# Patient Record
Sex: Female | Born: 1955 | Race: White | Hispanic: No | Marital: Married | Smoking: Never smoker
Health system: Southern US, Community
[De-identification: ages and names within clinical notes are randomized; demographics above are authoritative.]

## PROBLEM LIST (undated history)

## (undated) DIAGNOSIS — R112 Nausea with vomiting, unspecified: Secondary | ICD-10-CM

## (undated) DIAGNOSIS — G250 Essential tremor: Secondary | ICD-10-CM

## (undated) DIAGNOSIS — E0590 Thyrotoxicosis, unspecified without thyrotoxic crisis or storm: Secondary | ICD-10-CM

## (undated) DIAGNOSIS — T4145XA Adverse effect of unspecified anesthetic, initial encounter: Secondary | ICD-10-CM

## (undated) DIAGNOSIS — E039 Hypothyroidism, unspecified: Secondary | ICD-10-CM

## (undated) DIAGNOSIS — M1990 Unspecified osteoarthritis, unspecified site: Secondary | ICD-10-CM

## (undated) DIAGNOSIS — T7840XA Allergy, unspecified, initial encounter: Secondary | ICD-10-CM

## (undated) DIAGNOSIS — J90 Pleural effusion, not elsewhere classified: Secondary | ICD-10-CM

## (undated) DIAGNOSIS — G709 Myoneural disorder, unspecified: Secondary | ICD-10-CM

## (undated) DIAGNOSIS — H269 Unspecified cataract: Secondary | ICD-10-CM

## (undated) DIAGNOSIS — C4490 Unspecified malignant neoplasm of skin, unspecified: Secondary | ICD-10-CM

## (undated) DIAGNOSIS — I5022 Chronic systolic (congestive) heart failure: Secondary | ICD-10-CM

## (undated) DIAGNOSIS — K219 Gastro-esophageal reflux disease without esophagitis: Secondary | ICD-10-CM

## (undated) DIAGNOSIS — I428 Other cardiomyopathies: Secondary | ICD-10-CM

## (undated) DIAGNOSIS — I480 Paroxysmal atrial fibrillation: Secondary | ICD-10-CM

## (undated) DIAGNOSIS — Z9889 Other specified postprocedural states: Secondary | ICD-10-CM

## (undated) DIAGNOSIS — I10 Essential (primary) hypertension: Secondary | ICD-10-CM

## (undated) DIAGNOSIS — Z950 Presence of cardiac pacemaker: Secondary | ICD-10-CM

## (undated) DIAGNOSIS — M4120 Other idiopathic scoliosis, site unspecified: Secondary | ICD-10-CM

## (undated) DIAGNOSIS — T8859XA Other complications of anesthesia, initial encounter: Secondary | ICD-10-CM

## (undated) DIAGNOSIS — F32A Depression, unspecified: Secondary | ICD-10-CM

## (undated) DIAGNOSIS — M419 Scoliosis, unspecified: Secondary | ICD-10-CM

## (undated) DIAGNOSIS — I7300 Raynaud's syndrome without gangrene: Secondary | ICD-10-CM

## (undated) DIAGNOSIS — I351 Nonrheumatic aortic (valve) insufficiency: Secondary | ICD-10-CM

## (undated) DIAGNOSIS — G43909 Migraine, unspecified, not intractable, without status migrainosus: Secondary | ICD-10-CM

## (undated) DIAGNOSIS — I447 Left bundle-branch block, unspecified: Secondary | ICD-10-CM

## (undated) DIAGNOSIS — D219 Benign neoplasm of connective and other soft tissue, unspecified: Secondary | ICD-10-CM

## (undated) HISTORY — DX: Scoliosis, unspecified: M41.9

## (undated) HISTORY — DX: Migraine, unspecified, not intractable, without status migrainosus: G43.909

## (undated) HISTORY — PX: VESICOVAGINAL FISTULA CLOSURE W/ TAH: SUR271

## (undated) HISTORY — PX: CHOLECYSTECTOMY: SHX55

## (undated) HISTORY — DX: Allergy, unspecified, initial encounter: T78.40XA

## (undated) HISTORY — DX: Myoneural disorder, unspecified: G70.9

## (undated) HISTORY — PX: ROTATOR CUFF REPAIR: SHX139

## (undated) HISTORY — DX: Essential (primary) hypertension: I10

## (undated) HISTORY — PX: VAGINAL HYSTERECTOMY: SUR661

## (undated) HISTORY — PX: SKIN CANCER EXCISION: SHX779

## (undated) HISTORY — DX: Benign neoplasm of connective and other soft tissue, unspecified: D21.9

## (undated) HISTORY — DX: Paroxysmal atrial fibrillation: I48.0

## (undated) HISTORY — DX: Left bundle-branch block, unspecified: I44.7

## (undated) HISTORY — DX: Other idiopathic scoliosis, site unspecified: M41.20

## (undated) HISTORY — DX: Unspecified cataract: H26.9

## (undated) HISTORY — PX: SPINE SURGERY: SHX786

## (undated) HISTORY — PX: FOOT NEUROMA SURGERY: SHX646

## (undated) HISTORY — PX: TUBAL LIGATION: SHX77

## (undated) HISTORY — DX: Pleural effusion, not elsewhere classified: J90

## (undated) HISTORY — PX: BREAST SURGERY: SHX581

## (undated) HISTORY — DX: Depression, unspecified: F32.A

## (undated) HISTORY — PX: THYROID LOBECTOMY: SHX420

---

## 1998-04-29 ENCOUNTER — Other Ambulatory Visit: Admission: RE | Admit: 1998-04-29 | Discharge: 1998-04-29 | Payer: Self-pay | Admitting: *Deleted

## 1998-11-11 DIAGNOSIS — C4491 Basal cell carcinoma of skin, unspecified: Secondary | ICD-10-CM

## 1998-11-11 HISTORY — DX: Basal cell carcinoma of skin, unspecified: C44.91

## 1999-05-11 ENCOUNTER — Other Ambulatory Visit: Admission: RE | Admit: 1999-05-11 | Discharge: 1999-05-11 | Payer: Self-pay | Admitting: Obstetrics and Gynecology

## 1999-09-27 ENCOUNTER — Encounter: Payer: Self-pay | Admitting: Family Medicine

## 1999-09-27 ENCOUNTER — Encounter: Admission: RE | Admit: 1999-09-27 | Discharge: 1999-09-27 | Payer: Self-pay | Admitting: Family Medicine

## 1999-10-06 ENCOUNTER — Encounter: Admission: RE | Admit: 1999-10-06 | Discharge: 1999-10-06 | Payer: Self-pay | Admitting: Family Medicine

## 1999-10-06 ENCOUNTER — Encounter: Payer: Self-pay | Admitting: Family Medicine

## 2000-05-26 ENCOUNTER — Other Ambulatory Visit: Admission: RE | Admit: 2000-05-26 | Discharge: 2000-05-26 | Payer: Self-pay | Admitting: Obstetrics and Gynecology

## 2001-07-17 ENCOUNTER — Encounter: Admission: RE | Admit: 2001-07-17 | Discharge: 2001-07-17 | Payer: Self-pay | Admitting: Family Medicine

## 2001-07-17 ENCOUNTER — Encounter: Payer: Self-pay | Admitting: Family Medicine

## 2001-08-03 ENCOUNTER — Other Ambulatory Visit: Admission: RE | Admit: 2001-08-03 | Discharge: 2001-08-03 | Payer: Self-pay | Admitting: Obstetrics and Gynecology

## 2001-10-24 ENCOUNTER — Encounter: Admission: RE | Admit: 2001-10-24 | Discharge: 2001-10-24 | Payer: Self-pay

## 2001-11-13 ENCOUNTER — Encounter: Admission: RE | Admit: 2001-11-13 | Discharge: 2001-11-13 | Payer: Self-pay

## 2002-07-23 ENCOUNTER — Encounter: Payer: Self-pay | Admitting: Family Medicine

## 2002-07-23 ENCOUNTER — Encounter: Admission: RE | Admit: 2002-07-23 | Discharge: 2002-07-23 | Payer: Self-pay | Admitting: Family Medicine

## 2002-09-24 ENCOUNTER — Other Ambulatory Visit: Admission: RE | Admit: 2002-09-24 | Discharge: 2002-09-24 | Payer: Self-pay | Admitting: Family Medicine

## 2003-03-11 DIAGNOSIS — C4491 Basal cell carcinoma of skin, unspecified: Secondary | ICD-10-CM

## 2003-03-11 HISTORY — DX: Basal cell carcinoma of skin, unspecified: C44.91

## 2003-08-22 ENCOUNTER — Encounter: Admission: RE | Admit: 2003-08-22 | Discharge: 2003-08-22 | Payer: Self-pay | Admitting: Family Medicine

## 2003-09-26 ENCOUNTER — Other Ambulatory Visit: Admission: RE | Admit: 2003-09-26 | Discharge: 2003-09-26 | Payer: Self-pay | Admitting: Family Medicine

## 2004-02-29 ENCOUNTER — Emergency Department (HOSPITAL_COMMUNITY): Admission: EM | Admit: 2004-02-29 | Discharge: 2004-03-01 | Payer: Self-pay | Admitting: Emergency Medicine

## 2004-04-08 ENCOUNTER — Ambulatory Visit (HOSPITAL_COMMUNITY): Admission: RE | Admit: 2004-04-08 | Discharge: 2004-04-08 | Payer: Self-pay | Admitting: Family Medicine

## 2004-04-28 ENCOUNTER — Other Ambulatory Visit: Admission: RE | Admit: 2004-04-28 | Discharge: 2004-04-28 | Payer: Self-pay | Admitting: Interventional Radiology

## 2004-04-28 ENCOUNTER — Encounter (INDEPENDENT_AMBULATORY_CARE_PROVIDER_SITE_OTHER): Payer: Self-pay | Admitting: *Deleted

## 2004-04-28 ENCOUNTER — Encounter: Admission: RE | Admit: 2004-04-28 | Discharge: 2004-04-28 | Payer: Self-pay | Admitting: Family Medicine

## 2004-07-01 ENCOUNTER — Encounter (INDEPENDENT_AMBULATORY_CARE_PROVIDER_SITE_OTHER): Payer: Self-pay | Admitting: Specialist

## 2004-07-01 ENCOUNTER — Observation Stay (HOSPITAL_COMMUNITY): Admission: RE | Admit: 2004-07-01 | Discharge: 2004-07-02 | Payer: Self-pay | Admitting: Surgery

## 2004-07-14 DIAGNOSIS — C4492 Squamous cell carcinoma of skin, unspecified: Secondary | ICD-10-CM

## 2004-07-14 HISTORY — DX: Squamous cell carcinoma of skin, unspecified: C44.92

## 2004-08-23 ENCOUNTER — Encounter: Admission: RE | Admit: 2004-08-23 | Discharge: 2004-08-23 | Payer: Self-pay | Admitting: Family Medicine

## 2004-09-28 ENCOUNTER — Other Ambulatory Visit: Admission: RE | Admit: 2004-09-28 | Discharge: 2004-09-28 | Payer: Self-pay | Admitting: Family Medicine

## 2004-09-29 ENCOUNTER — Encounter: Admission: RE | Admit: 2004-09-29 | Discharge: 2004-09-29 | Payer: Self-pay | Admitting: Family Medicine

## 2004-10-15 ENCOUNTER — Encounter: Admission: RE | Admit: 2004-10-15 | Discharge: 2004-10-15 | Payer: Self-pay | Admitting: Family Medicine

## 2005-08-11 ENCOUNTER — Encounter (INDEPENDENT_AMBULATORY_CARE_PROVIDER_SITE_OTHER): Payer: Self-pay | Admitting: Specialist

## 2005-08-11 ENCOUNTER — Ambulatory Visit (HOSPITAL_BASED_OUTPATIENT_CLINIC_OR_DEPARTMENT_OTHER): Admission: RE | Admit: 2005-08-11 | Discharge: 2005-08-11 | Payer: Self-pay | Admitting: Plastic Surgery

## 2005-10-20 ENCOUNTER — Encounter: Admission: RE | Admit: 2005-10-20 | Discharge: 2005-10-20 | Payer: Self-pay | Admitting: Family Medicine

## 2007-02-15 DIAGNOSIS — I7300 Raynaud's syndrome without gangrene: Secondary | ICD-10-CM | POA: Insufficient documentation

## 2007-12-10 ENCOUNTER — Ambulatory Visit (HOSPITAL_COMMUNITY): Admission: RE | Admit: 2007-12-10 | Discharge: 2007-12-11 | Payer: Self-pay | Admitting: Obstetrics & Gynecology

## 2007-12-10 ENCOUNTER — Encounter: Payer: Self-pay | Admitting: Obstetrics & Gynecology

## 2008-01-30 DIAGNOSIS — G43709 Chronic migraine without aura, not intractable, without status migrainosus: Secondary | ICD-10-CM | POA: Insufficient documentation

## 2008-01-30 DIAGNOSIS — G43009 Migraine without aura, not intractable, without status migrainosus: Secondary | ICD-10-CM | POA: Insufficient documentation

## 2008-01-30 DIAGNOSIS — I10 Essential (primary) hypertension: Secondary | ICD-10-CM | POA: Insufficient documentation

## 2008-03-21 DIAGNOSIS — E559 Vitamin D deficiency, unspecified: Secondary | ICD-10-CM | POA: Insufficient documentation

## 2008-05-28 ENCOUNTER — Ambulatory Visit: Payer: Self-pay | Admitting: Oncology

## 2008-06-11 LAB — CBC WITH DIFFERENTIAL/PLATELET
BASO%: 0.4 % (ref 0.0–2.0)
Basophils Absolute: 0 10*3/uL (ref 0.0–0.1)
EOS%: 0.9 % (ref 0.0–7.0)
Eosinophils Absolute: 0.1 10*3/uL (ref 0.0–0.5)
HCT: 39.4 % (ref 34.8–46.6)
HGB: 13.7 g/dL (ref 11.6–15.9)
LYMPH%: 21.5 % (ref 14.0–49.7)
MCH: 31 pg (ref 25.1–34.0)
MCHC: 34.8 g/dL (ref 31.5–36.0)
MCV: 89 fL (ref 79.5–101.0)
MONO#: 1.1 10*3/uL — ABNORMAL HIGH (ref 0.1–0.9)
MONO%: 9 % (ref 0.0–14.0)
NEUT#: 8 10*3/uL — ABNORMAL HIGH (ref 1.5–6.5)
NEUT%: 68.2 % (ref 38.4–76.8)
Platelets: 404 10*3/uL — ABNORMAL HIGH (ref 145–400)
RBC: 4.43 10*6/uL (ref 3.70–5.45)
RDW: 12.2 % (ref 11.2–14.5)
WBC: 11.8 10*3/uL — ABNORMAL HIGH (ref 3.9–10.3)
lymph#: 2.5 10*3/uL (ref 0.9–3.3)

## 2008-06-11 LAB — COMPREHENSIVE METABOLIC PANEL
ALT: 29 U/L (ref 0–35)
AST: 29 U/L (ref 0–37)
Albumin: 3.3 g/dL — ABNORMAL LOW (ref 3.5–5.2)
Alkaline Phosphatase: 67 U/L (ref 39–117)
BUN: 13 mg/dL (ref 6–23)
CO2: 32 mEq/L (ref 19–32)
Calcium: 8.7 mg/dL (ref 8.4–10.5)
Chloride: 101 mEq/L (ref 96–112)
Creatinine, Ser: 0.83 mg/dL (ref 0.40–1.20)
Glucose, Bld: 88 mg/dL (ref 70–99)
Potassium: 3.8 mEq/L (ref 3.5–5.3)
Sodium: 137 mEq/L (ref 135–145)
Total Bilirubin: 0.8 mg/dL (ref 0.3–1.2)
Total Protein: 6.4 g/dL (ref 6.0–8.3)

## 2008-06-11 LAB — ERYTHROCYTE SEDIMENTATION RATE: Sed Rate: 36 mm/hr — ABNORMAL HIGH (ref 0–30)

## 2008-06-11 LAB — CHCC SMEAR

## 2008-06-17 LAB — PTT FACTOR INHIBITOR (MIXING STUDY): PTT: 41 seconds — ABNORMAL HIGH (ref 24–37)

## 2008-06-17 LAB — VON WILLEBRAND FACTOR MULTIMER
Factor-VIII Activity: 100 % (ref 50–180)
Ristocetin Co-Factor: 106 % (ref 42–200)
Von Willebrand Factor Ag: 116 % (ref >49)

## 2008-06-17 LAB — PROTHROMBIN TIME
INR: 1 (ref 0.0–1.5)
Prothrombin Time: 13.1 seconds (ref 11.6–15.2)

## 2008-06-17 LAB — MIXING STUDY DILUTIONS, PTT
Patient 1/1 Immediate Mix: 32 seconds
Patient 4/1 Immediate Mix: 35 seconds

## 2008-06-30 LAB — LUPUS ANTICOAGULANT PANEL
DRVVT: 34.2 secs — ABNORMAL LOW (ref 36.1–47.0)
PTT Lupus Anticoagulant: 49.3 secs — ABNORMAL HIGH (ref 36.3–48.8)
PTTLA 4:1 Mix: 39.1 secs (ref 36.3–48.8)

## 2008-06-30 LAB — FACTOR 12 ASSAY: Factor XII Activity: 192 % — ABNORMAL HIGH (ref 58–166)

## 2008-06-30 LAB — FACTOR 8 ASSAY: Coagulation Factor VIII: 120 % (ref 73–140)

## 2008-06-30 LAB — FACTOR 11: Factor XI Activity: 94 % (ref 56–153)

## 2008-06-30 LAB — FACTOR 13, QUAL

## 2008-06-30 LAB — FACTOR 8 INHIBITOR: Factor VIII: C, Activity: 118 % (ref 56–191)

## 2008-06-30 LAB — FACTOR 9 ASSAY: Coagulation Factor IX: 126 % (ref 75–134)

## 2008-06-30 LAB — FACTOR 10 ASSAY: Factor X Activity: 126 % (ref 72–134)

## 2008-07-24 DIAGNOSIS — F411 Generalized anxiety disorder: Secondary | ICD-10-CM | POA: Insufficient documentation

## 2009-02-18 ENCOUNTER — Encounter: Admission: RE | Admit: 2009-02-18 | Discharge: 2009-02-18 | Payer: Self-pay | Admitting: Rheumatology

## 2009-04-01 ENCOUNTER — Ambulatory Visit (HOSPITAL_COMMUNITY): Admission: RE | Admit: 2009-04-01 | Discharge: 2009-04-02 | Payer: Self-pay | Admitting: Orthopedic Surgery

## 2009-04-15 DIAGNOSIS — Z780 Asymptomatic menopausal state: Secondary | ICD-10-CM | POA: Insufficient documentation

## 2009-06-11 ENCOUNTER — Encounter: Admission: RE | Admit: 2009-06-11 | Discharge: 2009-06-11 | Payer: Self-pay | Admitting: Orthopedic Surgery

## 2009-07-27 DIAGNOSIS — C4492 Squamous cell carcinoma of skin, unspecified: Secondary | ICD-10-CM

## 2009-07-27 HISTORY — DX: Squamous cell carcinoma of skin, unspecified: C44.92

## 2009-11-10 ENCOUNTER — Encounter: Admission: RE | Admit: 2009-11-10 | Discharge: 2009-11-10 | Payer: Self-pay | Admitting: Orthopedic Surgery

## 2009-12-25 DIAGNOSIS — M48061 Spinal stenosis, lumbar region without neurogenic claudication: Secondary | ICD-10-CM | POA: Insufficient documentation

## 2009-12-25 HISTORY — DX: Spinal stenosis, lumbar region without neurogenic claudication: M48.061

## 2009-12-31 HISTORY — PX: BACK SURGERY: SHX140

## 2010-02-08 ENCOUNTER — Inpatient Hospital Stay (HOSPITAL_COMMUNITY)
Admission: EM | Admit: 2010-02-08 | Discharge: 2010-02-15 | DRG: 544 | Disposition: A | Discharge status: Home / Self Care | Payer: BC Managed Care – PPO | Attending: Internal Medicine | Admitting: Internal Medicine

## 2010-02-08 DIAGNOSIS — I5023 Acute on chronic systolic (congestive) heart failure: Principal | ICD-10-CM | POA: Diagnosis present

## 2010-02-08 DIAGNOSIS — I4891 Unspecified atrial fibrillation: Secondary | ICD-10-CM | POA: Diagnosis present

## 2010-02-08 DIAGNOSIS — F411 Generalized anxiety disorder: Secondary | ICD-10-CM | POA: Diagnosis present

## 2010-02-08 DIAGNOSIS — E876 Hypokalemia: Secondary | ICD-10-CM | POA: Diagnosis not present

## 2010-02-08 DIAGNOSIS — R5381 Other malaise: Secondary | ICD-10-CM | POA: Diagnosis present

## 2010-02-08 DIAGNOSIS — J690 Pneumonitis due to inhalation of food and vomit: Secondary | ICD-10-CM | POA: Diagnosis present

## 2010-02-08 DIAGNOSIS — J90 Pleural effusion, not elsewhere classified: Secondary | ICD-10-CM | POA: Diagnosis present

## 2010-02-08 DIAGNOSIS — G4700 Insomnia, unspecified: Secondary | ICD-10-CM | POA: Diagnosis present

## 2010-02-08 DIAGNOSIS — I447 Left bundle-branch block, unspecified: Secondary | ICD-10-CM | POA: Diagnosis present

## 2010-02-08 DIAGNOSIS — I7300 Raynaud's syndrome without gangrene: Secondary | ICD-10-CM | POA: Diagnosis present

## 2010-02-08 DIAGNOSIS — M810 Age-related osteoporosis without current pathological fracture: Secondary | ICD-10-CM | POA: Diagnosis present

## 2010-02-08 DIAGNOSIS — G43909 Migraine, unspecified, not intractable, without status migrainosus: Secondary | ICD-10-CM | POA: Diagnosis present

## 2010-02-08 DIAGNOSIS — J189 Pneumonia, unspecified organism: Secondary | ICD-10-CM | POA: Diagnosis present

## 2010-02-08 DIAGNOSIS — E039 Hypothyroidism, unspecified: Secondary | ICD-10-CM | POA: Diagnosis present

## 2010-02-08 DIAGNOSIS — Z7982 Long term (current) use of aspirin: Secondary | ICD-10-CM

## 2010-02-08 DIAGNOSIS — D638 Anemia in other chronic diseases classified elsewhere: Secondary | ICD-10-CM | POA: Diagnosis present

## 2010-02-08 DIAGNOSIS — I428 Other cardiomyopathies: Secondary | ICD-10-CM | POA: Diagnosis present

## 2010-02-08 DIAGNOSIS — I509 Heart failure, unspecified: Secondary | ICD-10-CM | POA: Diagnosis present

## 2010-02-08 LAB — DIFFERENTIAL
Basophils Absolute: 0 10*3/uL (ref 0.0–0.1)
Basophils Relative: 0 % (ref 0–1)
Eosinophils Absolute: 0.3 10*3/uL (ref 0.0–0.7)
Eosinophils Relative: 3 % (ref 0–5)
Lymphocytes Relative: 17 % (ref 12–46)
Lymphs Abs: 1.9 10*3/uL (ref 0.7–4.0)
Monocytes Absolute: 0.9 10*3/uL (ref 0.1–1.0)
Monocytes Relative: 8 % (ref 3–12)
Neutro Abs: 8.1 10*3/uL — ABNORMAL HIGH (ref 1.7–7.7)
Neutrophils Relative %: 72 % (ref 43–77)

## 2010-02-08 LAB — BASIC METABOLIC PANEL
BUN: 11 mg/dL (ref 6–23)
CO2: 28 mEq/L (ref 19–32)
Calcium: 8.9 mg/dL (ref 8.4–10.5)
Chloride: 99 mEq/L (ref 96–112)
Creatinine, Ser: 1.11 mg/dL (ref 0.4–1.2)
GFR calc Af Amer: >60 mL/min (ref >60)
GFR calc non Af Amer: 51 mL/min — ABNORMAL LOW (ref >60)
Glucose, Bld: 93 mg/dL (ref 70–99)
Potassium: 3.2 mEq/L — ABNORMAL LOW (ref 3.5–5.1)
Sodium: 139 mEq/L (ref 135–145)

## 2010-02-08 LAB — CBC
HCT: 35.4 % — ABNORMAL LOW (ref 36.0–46.0)
Hemoglobin: 11.2 g/dL — ABNORMAL LOW (ref 12.0–15.0)
MCH: 28.4 pg (ref 26.0–34.0)
MCHC: 31.6 g/dL (ref 30.0–36.0)
MCV: 89.8 fL (ref 78.0–100.0)
Platelets: 483 10*3/uL — ABNORMAL HIGH (ref 150–400)
RBC: 3.94 MIL/uL (ref 3.87–5.11)
RDW: 15.4 % (ref 11.5–15.5)
WBC: 11.1 10*3/uL — ABNORMAL HIGH (ref 4.0–10.5)

## 2010-02-08 LAB — POCT CARDIAC MARKERS
CKMB, poc: 4.8 ng/mL (ref 1.0–8.0)
CKMB, poc: 2.3 ng/mL (ref 1.0–8.0)
Myoglobin, poc: 116 ng/mL (ref 12–200)
Myoglobin, poc: 75.5 ng/mL (ref 12–200)
Troponin i, poc: 0.11 ng/mL — ABNORMAL HIGH (ref 0.00–0.09)
Troponin i, poc: 0.16 ng/mL — ABNORMAL HIGH (ref 0.00–0.09)

## 2010-02-08 LAB — CK TOTAL AND CKMB (NOT AT ARMC)
CK, MB: 3.6 ng/mL (ref 0.3–4.0)
Relative Index: INVALID (ref 0.0–2.5)
Total CK: 56 U/L (ref 7–177)

## 2010-02-08 LAB — BRAIN NATRIURETIC PEPTIDE: Pro B Natriuretic peptide (BNP): 598 pg/mL — ABNORMAL HIGH (ref 0.0–100.0)

## 2010-02-08 LAB — TSH: TSH: 2.531 u[IU]/mL (ref 0.350–4.500)

## 2010-02-08 LAB — TROPONIN I: Troponin I: 0.06 ng/mL (ref 0.00–0.06)

## 2010-02-09 ENCOUNTER — Other Ambulatory Visit: Payer: Self-pay | Admitting: Interventional Radiology

## 2010-02-09 LAB — BODY FLUID CELL COUNT WITH DIFFERENTIAL
Eos, Fluid: 0 %
Lymphs, Fluid: 15 %
Monocyte-Macrophage-Serous Fluid: 29 % — ABNORMAL LOW (ref 50–90)
Neutrophil Count, Fluid: 56 % — ABNORMAL HIGH (ref 0–25)
Other Cells, Fluid: 0 %
Total Nucleated Cell Count, Fluid: 1475 cu mm — ABNORMAL HIGH (ref 0–1000)

## 2010-02-09 LAB — CBC
HCT: 32.5 % — ABNORMAL LOW (ref 36.0–46.0)
Hemoglobin: 10.3 g/dL — ABNORMAL LOW (ref 12.0–15.0)
MCH: 28.8 pg (ref 26.0–34.0)
MCHC: 31.7 g/dL (ref 30.0–36.0)
MCV: 90.8 fL (ref 78.0–100.0)
Platelets: 431 10*3/uL — ABNORMAL HIGH (ref 150–400)
RBC: 3.58 MIL/uL — ABNORMAL LOW (ref 3.87–5.11)
RDW: 15.7 % — ABNORMAL HIGH (ref 11.5–15.5)
WBC: 10.5 10*3/uL (ref 4.0–10.5)

## 2010-02-09 LAB — CARDIAC PANEL(CRET KIN+CKTOT+MB+TROPI)
CK, MB: 3.1 ng/mL (ref 0.3–4.0)
CK, MB: 3.7 ng/mL (ref 0.3–4.0)
Relative Index: INVALID (ref 0.0–2.5)
Relative Index: INVALID (ref 0.0–2.5)
Total CK: 52 U/L (ref 7–177)
Total CK: 56 U/L (ref 7–177)
Troponin I: 0.05 ng/mL (ref 0.00–0.06)
Troponin I: 0.05 ng/mL (ref 0.00–0.06)

## 2010-02-09 LAB — BASIC METABOLIC PANEL
BUN: 7 mg/dL (ref 6–23)
CO2: 30 mEq/L (ref 19–32)
Calcium: 8.5 mg/dL (ref 8.4–10.5)
Chloride: 103 mEq/L (ref 96–112)
Creatinine, Ser: 1.05 mg/dL (ref 0.4–1.2)
GFR calc Af Amer: >60 mL/min (ref >60)
GFR calc non Af Amer: 54 mL/min — ABNORMAL LOW (ref >60)
Glucose, Bld: 97 mg/dL (ref 70–99)
Potassium: 3.2 mEq/L — ABNORMAL LOW (ref 3.5–5.1)
Sodium: 143 mEq/L (ref 135–145)

## 2010-02-09 LAB — LACTATE DEHYDROGENASE, PLEURAL OR PERITONEAL FLUID: LD, Fluid: 134 U/L — ABNORMAL HIGH (ref 3–23)

## 2010-02-09 LAB — PROTEIN, BODY FLUID: Total protein, fluid: 3.9 g/dL

## 2010-02-09 LAB — MAGNESIUM: Magnesium: 2 mg/dL (ref 1.5–2.5)

## 2010-02-09 LAB — GLUCOSE, SEROUS FLUID: Glucose, Fluid: 119 mg/dL

## 2010-02-10 ENCOUNTER — Encounter (HOSPITAL_COMMUNITY): Payer: Self-pay

## 2010-02-10 ENCOUNTER — Inpatient Hospital Stay (HOSPITAL_COMMUNITY): Payer: BC Managed Care – PPO

## 2010-02-10 DIAGNOSIS — I059 Rheumatic mitral valve disease, unspecified: Secondary | ICD-10-CM

## 2010-02-10 LAB — CBC
HCT: 30.8 % — ABNORMAL LOW (ref 36.0–46.0)
Hemoglobin: 9.8 g/dL — ABNORMAL LOW (ref 12.0–15.0)
MCH: 28.9 pg (ref 26.0–34.0)
MCHC: 31.8 g/dL (ref 30.0–36.0)
MCV: 90.9 fL (ref 78.0–100.0)
Platelets: 420 10*3/uL — ABNORMAL HIGH (ref 150–400)
RBC: 3.39 MIL/uL — ABNORMAL LOW (ref 3.87–5.11)
RDW: 15.5 % (ref 11.5–15.5)
WBC: 10.9 10*3/uL — ABNORMAL HIGH (ref 4.0–10.5)

## 2010-02-10 LAB — PATHOLOGIST SMEAR REVIEW

## 2010-02-10 LAB — BASIC METABOLIC PANEL
BUN: 7 mg/dL (ref 6–23)
CO2: 30 mEq/L (ref 19–32)
Calcium: 8.4 mg/dL (ref 8.4–10.5)
Chloride: 101 mEq/L (ref 96–112)
Creatinine, Ser: 1.17 mg/dL (ref 0.4–1.2)
GFR calc Af Amer: 58 mL/min — ABNORMAL LOW (ref >60)
GFR calc non Af Amer: 48 mL/min — ABNORMAL LOW (ref >60)
Glucose, Bld: 94 mg/dL (ref 70–99)
Potassium: 3.4 mEq/L — ABNORMAL LOW (ref 3.5–5.1)
Sodium: 140 mEq/L (ref 135–145)

## 2010-02-10 LAB — BRAIN NATRIURETIC PEPTIDE: Pro B Natriuretic peptide (BNP): 488 pg/mL — ABNORMAL HIGH (ref 0.0–100.0)

## 2010-02-10 NOTE — H&P (Signed)
[name], [name]              ACCOUNT NO.:  [id number]  MEDICAL RECORD NO.:  [id number]          PATIENT TYPE:  INP  LOCATION:  3739                         FACILITY:  [facility]  PHYSICIAN:  Hillery Aldo, M.D.   DATE OF BIRTH:  1955-06-07  DATE OF ADMISSION:  02/08/2010 DATE OF DISCHARGE:                             HISTORY & PHYSICAL   PRIMARY CARE PHYSICIAN:  Tammy R. Collins Scotland, MD at the [facility] in [place], [place].  CHIEF COMPLAINT:  Dyspnea, nonproductive cough.  HISTORY OF PRESENT ILLNESS:  The patient is a 55 year old female with a past medical history of surgery for scoliosis.  She was at [facility] for this surgery, which took place on December 31, 2009.  Her hospital stay was complicated by the development of aspiration pneumonia and ventilatory-dependent respiratory failure.  She was apparently in the hospital for 3 weeks and then subsequently discharged to rehab facility and ultimately returned home on January 30, 2010.  The patient states that since she has been home, she has had progressive dyspnea to the point where she is unable to lie flat in bed.  She has had a nonproductive cough, but no frank fever or chills.  She has been weak and has been having to ambulate with a walker.  The patient saw her primary care physician today and had a chest radiograph done, which showed a very large effusion and infiltrate.  She subsequently was referred to the emergency department, evaluated by the emergency department physicians, then referred to the hospitalist service for inpatient treatment.  PAST MEDICAL HISTORY: 1. Osteoporosis. 2. Scoliosis. 3. History of pneumonia x2. 4. Hypothyroidism. 5. Fibroids. 6. Migraine headaches. 7. Left bundle-branch block. 8. Insomnia. 9. Anxiety. 10.Paroxysmal atrial fibrillation. 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  PAST SURGICAL HISTORY: 1. Cholecystectomy. 2. Hysterectomy secondary to uterine prolapse. 3. Right thyroid  lobectomy for treatment of a right thyroid nodule     with pathology showing atypical follicular cells. 4. Left shoulder repair for a torn rotator cuff. 5. Scoliosis surgery.  FAMILY HISTORY:  The patient's father is alive at age 61 and suffers with macular degeneration.  He also has heart disease.  The patient's mother died at 71 from congestive heart failure.  She also had a prior stroke and was hypertensive.  The patient has 4 sisters.  One of them, has atrial fibrillation and heart disease, but the others 3 are healthy. She has 1 healthy son.  SOCIAL HISTORY:  The patient is married and lives with her [place] in [place], [place].  His name is Jenissa Tyrell and serves as a Aeronautical engineer.  He can be reached at area code, [phone] on his cell phone.  The patient is a lifelong nonsmoker, does not consume alcohol or do drugs.  She works as a Runner, broadcasting/film/video.  ALLERGIES:  CODEINE, PREDNISONE, ROBAXIN, and FENTANYL PATCHES  CURRENT MEDICATIONS: 1. Acetaminophen 1500 mg p.o. t.i.d. 2. Calcium plus vitamin D 500 mg p.o. b.i.d. 3. Diltiazem 60 mg ER 1 p.o. b.i.d. 4. Fleet bisacodyl 10 mg rectal suppository daily p.r.n. 5. Flonase 2 sprays in each nostril daily. 6. Gabapentin 300 mg p.o. b.i.d. 7. Imipramine  100 mg p.o. b.i.d. 8. Incentive spirometer q.1 hour while awake. 9. Lunesta 3 mg p.o. nightly. 10.Magic mouth wash swish and swallow 1 teaspoon q.i.d. p.r.n. 11.Mobic 7.5 mg p.o. b.i.d., discontinued after her stay at [facility]. 12.Oxygen at 2 L p.r.n. 13.Omeprazole 10 mg p.o. nightly. 14.Relpax 40 mg p.o. at onset of headache, repeat in 2 hours p.r.n.     with a maximum daily dose of 2 doses per day. 15.Senna 8.6 p.r.n. constipation. 16.Stool softener daily p.r.n. constipation. 17.Synthroid 125 mcg p.o. daily. 18.Tramadol 25 mg daily p.r.n. pain. 19.Voltaren gel p.r.n.  REVIEW OF SYSTEMS:  Comprehensive 14-point review of systems is as noted in the elements of the HPI  above.  Pertinent negatives include normal appetite, no complaints of chest pain, no nausea, vomiting, diarrhea, melena, hematochezia or dysuria.  Pertinent positives are occasional dysphagia for solids and liquids with pain upon swallowing.  PHYSICAL EXAMINATION:  VITAL SIGNS:  Temperature 97.4, pulse 88, respirations 17, blood pressure 134/65, O2 saturation 96% on room air. GENERAL:  Well-developed, well-nourished female in no acute distress. HEENT:  Normocephalic, atraumatic.  PERRL.  EOMI.  Sclerae nonicteric. Oropharynx reveals dry mucous membranes, but is otherwise clear. NECK:  Supple, no thyromegaly, no lymphadenopathy, no jugular venous distention. CHEST:  Markedly diminished breath sounds on the right.  Crackles at the left lung base.  No wheeze. HEART:  Regular rate, rhythm.  No murmurs, rubs, or gallops. ABDOMEN:  Soft, nontender, nondistended with normoactive bowel sounds. EXTREMITIES:  A 2+ edema bilaterally. SKIN:  Warm and dry.  No rashes. NEUROLOGIC:  The patient is alert and oriented x3.  Cranial nerves II through XII grossly intact.  Nonfocal.  DATA REVIEW: 1. Chest x-ray on February 08, 2010, showed moderate enlargement of the     cardiac silhouette with mild vascular congestion.  Minimal right     basilar atelectasis associated with elevated right hemidiaphragm     and small right pleural effusion. 2. Right decubitus film showed a layering of small, mobile right     pleural effusion. 3. CT angiogram of the chest showed no sign of pulmonary emboli.     Large right pleural effusion.  Complete collapse of the right     middle lobe and right lower lobe and mild atelectasis in the right     upper lobe.  Mild atelectasis and/or infiltrate in the lingula.  A 12-lead EKG shows normal sinus rhythm with possible left atrial enlargement and left axis deviation along with a left bundle-branch block.  Ventricular rate is 85 beats per minute.  This is not significantly  changed from a prior EKG dated December 10, 2007.  LABORATORY DATA:  White blood cell count was 11.1, hemoglobin 11.2, hematocrit 35.4, platelets 483.  Sodium was 139, potassium 3.2, chloride 99, bicarb 28, BUN 11, creatinine 1.11, glucose 93, calcium 8.9.  Point- of-care markers reveal normal myoglobin and CK-MB, but did reveal a troponin I elevation of 0.11 and 0.16.  ASSESSMENT/PLAN: 1. Healthcare-associated pneumonia:  The patient will be admitted and     placed on broad-spectrum antibiotics.  MRSA and Pseudomonas will be     covered with vancomycin and Zosyn respectively.  If she spikes     fever, would also obtain 2 sets of blood cultures.  Her cough is     dry and at this time, would not be able to get sputum cultures.     She does have a history of aspiration during her most recent  hospital stay and will have Speech Therapy evaluate her swallowing     given her complaints of dysphagia. 2. Large right pleural effusion:  This is most likely parapneumonic,     but cannot rule out congestive heart failure.  Would obtain a     therapeutic and diagnostic thoracentesis, and obtain a 2-     dimensional echocardiogram to determine her overall left     ventricular function. 3. Hypokalemia:  Replete the patient's potassium and monitor her     electrolytes closely. 4. Troponin I elevation.  This is most likely demand ischemia from her     collapsed lung, but cannot rule out congestive heart failure or     underlying coronary artery disease.  We will cycle her cardiac     markers q.8 h x3 sets and monitor the trend.  We will place her on     aspirin therapy and obtain a 2-dimensional echocardiogram.  An     ischemic workup will likely need to be done at some point once her     pneumonia is adequately treated. 5. Hypothyroidism:  We will obtain thyroid function studies and     continue her on her usual dose of Synthroid. 6. Migraine headaches:  Continue the use Relpax on an as-needed  basis. 7. History of atrial fibrillation:  The patient is currently in normal     sinus rhythm on diltiazem therapy.  It is unclear if she has been     considered for Coumadin in the past, but at this point we will hold     off given that she is     in normal sinus rhythm. 8. Prophylaxis:  Initiate Lovenox for DVT prophylaxis.  Time spent on admission including face-to-face time equals approximately 1 hour.     Hillery Aldo, M.D.     CR/MEDQ  D:  02/08/2010  T:  02/09/2010  Job:  [id number]  cc:   Tammy R. Collins Scotland, M.D.  Electronically Signed by Hillery Aldo M.D. on 02/10/2010 05:08:11 PM

## 2010-02-11 ENCOUNTER — Inpatient Hospital Stay (HOSPITAL_COMMUNITY): Payer: BC Managed Care – PPO

## 2010-02-11 DIAGNOSIS — J90 Pleural effusion, not elsewhere classified: Secondary | ICD-10-CM

## 2010-02-11 DIAGNOSIS — J9819 Other pulmonary collapse: Secondary | ICD-10-CM

## 2010-02-11 DIAGNOSIS — J159 Unspecified bacterial pneumonia: Secondary | ICD-10-CM

## 2010-02-11 LAB — COMPREHENSIVE METABOLIC PANEL
ALT: 19 U/L (ref 0–35)
AST: 19 U/L (ref 0–37)
Albumin: 2.5 g/dL — ABNORMAL LOW (ref 3.5–5.2)
Alkaline Phosphatase: 81 U/L (ref 39–117)
BUN: 8 mg/dL (ref 6–23)
CO2: 29 mEq/L (ref 19–32)
Calcium: 7.9 mg/dL — ABNORMAL LOW (ref 8.4–10.5)
Chloride: 98 mEq/L (ref 96–112)
Creatinine, Ser: 1.12 mg/dL (ref 0.4–1.2)
GFR calc Af Amer: >60 mL/min (ref >60)
GFR calc non Af Amer: 50 mL/min — ABNORMAL LOW (ref >60)
Glucose, Bld: 88 mg/dL (ref 70–99)
Potassium: 3.7 mEq/L (ref 3.5–5.1)
Sodium: 138 mEq/L (ref 135–145)
Total Bilirubin: 0.7 mg/dL (ref 0.3–1.2)
Total Protein: 5.7 g/dL — ABNORMAL LOW (ref 6.0–8.3)

## 2010-02-11 LAB — CBC
HCT: 28.1 % — ABNORMAL LOW (ref 36.0–46.0)
Hemoglobin: 8.9 g/dL — ABNORMAL LOW (ref 12.0–15.0)
MCH: 28.1 pg (ref 26.0–34.0)
MCHC: 31.7 g/dL (ref 30.0–36.0)
MCV: 88.6 fL (ref 78.0–100.0)
Platelets: 406 10*3/uL — ABNORMAL HIGH (ref 150–400)
RBC: 3.17 MIL/uL — ABNORMAL LOW (ref 3.87–5.11)
RDW: 15.4 % (ref 11.5–15.5)
WBC: 8.4 10*3/uL (ref 4.0–10.5)

## 2010-02-11 LAB — LIPID PANEL
Cholesterol: 103 mg/dL (ref 0–200)
HDL: 42 mg/dL (ref >39)
LDL Cholesterol: 44 mg/dL (ref 0–99)
Total CHOL/HDL Ratio: 2.5 RATIO
Triglycerides: 85 mg/dL (ref <150)
VLDL: 17 mg/dL (ref 0–40)

## 2010-02-11 LAB — LACTATE DEHYDROGENASE: LDH: 160 U/L (ref 94–250)

## 2010-02-11 LAB — PROTIME-INR
INR: 1.06 (ref 0.00–1.49)
Prothrombin Time: 14 seconds (ref 11.6–15.2)

## 2010-02-11 LAB — MAGNESIUM: Magnesium: 1.6 mg/dL (ref 1.5–2.5)

## 2010-02-12 ENCOUNTER — Inpatient Hospital Stay (HOSPITAL_COMMUNITY): Payer: BC Managed Care – PPO

## 2010-02-12 DIAGNOSIS — M7989 Other specified soft tissue disorders: Secondary | ICD-10-CM

## 2010-02-12 LAB — CBC
HCT: 30.3 % — ABNORMAL LOW (ref 36.0–46.0)
Hemoglobin: 9.6 g/dL — ABNORMAL LOW (ref 12.0–15.0)
MCH: 28.3 pg (ref 26.0–34.0)
MCHC: 31.7 g/dL (ref 30.0–36.0)
MCV: 89.4 fL (ref 78.0–100.0)
Platelets: 430 10*3/uL — ABNORMAL HIGH (ref 150–400)
RBC: 3.39 MIL/uL — ABNORMAL LOW (ref 3.87–5.11)
RDW: 15.5 % (ref 11.5–15.5)
WBC: 9.1 10*3/uL (ref 4.0–10.5)

## 2010-02-12 LAB — IRON AND TIBC
Iron: 50 ug/dL (ref 42–135)
Saturation Ratios: 24 % (ref 20–55)
TIBC: 205 ug/dL — ABNORMAL LOW (ref 250–470)
UIBC: 155 ug/dL

## 2010-02-12 LAB — PHOSPHORUS: Phosphorus: 2.8 mg/dL (ref 2.3–4.6)

## 2010-02-12 LAB — FERRITIN: Ferritin: 248 ng/mL (ref 10–291)

## 2010-02-12 LAB — BASIC METABOLIC PANEL
BUN: 10 mg/dL (ref 6–23)
CO2: 30 mEq/L (ref 19–32)
Calcium: 8.1 mg/dL — ABNORMAL LOW (ref 8.4–10.5)
Chloride: 98 mEq/L (ref 96–112)
Creatinine, Ser: 1.21 mg/dL — ABNORMAL HIGH (ref 0.4–1.2)
GFR calc Af Amer: 56 mL/min — ABNORMAL LOW (ref >60)
GFR calc non Af Amer: 46 mL/min — ABNORMAL LOW (ref >60)
Glucose, Bld: 102 mg/dL — ABNORMAL HIGH (ref 70–99)
Potassium: 3.2 mEq/L — ABNORMAL LOW (ref 3.5–5.1)
Sodium: 138 mEq/L (ref 135–145)

## 2010-02-12 LAB — MAGNESIUM: Magnesium: 1.7 mg/dL (ref 1.5–2.5)

## 2010-02-12 LAB — SEDIMENTATION RATE: Sed Rate: 55 mm/hr — ABNORMAL HIGH (ref 0–22)

## 2010-02-12 LAB — FOLATE: Folate: 4.8 ng/mL

## 2010-02-12 LAB — VITAMIN B12: Vitamin B-12: 302 pg/mL (ref 211–911)

## 2010-02-13 LAB — BODY FLUID CULTURE
Culture: NO GROWTH
Special Requests: 60

## 2010-02-13 LAB — CBC
HCT: 30.5 % — ABNORMAL LOW (ref 36.0–46.0)
Hemoglobin: 9.6 g/dL — ABNORMAL LOW (ref 12.0–15.0)
MCH: 28 pg (ref 26.0–34.0)
MCHC: 31.5 g/dL (ref 30.0–36.0)
MCV: 88.9 fL (ref 78.0–100.0)
Platelets: 446 10*3/uL — ABNORMAL HIGH (ref 150–400)
RBC: 3.43 MIL/uL — ABNORMAL LOW (ref 3.87–5.11)
RDW: 15.2 % (ref 11.5–15.5)
WBC: 8.7 10*3/uL (ref 4.0–10.5)

## 2010-02-13 LAB — BASIC METABOLIC PANEL
BUN: 12 mg/dL (ref 6–23)
CO2: 31 mEq/L (ref 19–32)
Calcium: 8.6 mg/dL (ref 8.4–10.5)
Chloride: 97 mEq/L (ref 96–112)
Creatinine, Ser: 1.3 mg/dL — ABNORMAL HIGH (ref 0.4–1.2)
GFR calc Af Amer: 51 mL/min — ABNORMAL LOW (ref >60)
GFR calc non Af Amer: 42 mL/min — ABNORMAL LOW (ref >60)
Glucose, Bld: 90 mg/dL (ref 70–99)
Potassium: 3.3 mEq/L — ABNORMAL LOW (ref 3.5–5.1)
Sodium: 137 mEq/L (ref 135–145)

## 2010-02-13 LAB — BRAIN NATRIURETIC PEPTIDE: Pro B Natriuretic peptide (BNP): 196 pg/mL — ABNORMAL HIGH (ref 0.0–100.0)

## 2010-02-14 ENCOUNTER — Inpatient Hospital Stay (HOSPITAL_COMMUNITY): Payer: BC Managed Care – PPO

## 2010-02-14 LAB — CULTURE, BLOOD (ROUTINE X 2)
Culture  Setup Time: 201201301843
Culture  Setup Time: 201201301843
Culture: NO GROWTH
Culture: NO GROWTH

## 2010-02-14 LAB — BRAIN NATRIURETIC PEPTIDE: Pro B Natriuretic peptide (BNP): 95 pg/mL (ref 0.0–100.0)

## 2010-02-14 LAB — BASIC METABOLIC PANEL
BUN: 16 mg/dL (ref 6–23)
CO2: 31 mEq/L (ref 19–32)
Calcium: 8.8 mg/dL (ref 8.4–10.5)
Chloride: 91 mEq/L — ABNORMAL LOW (ref 96–112)
Creatinine, Ser: 1.36 mg/dL — ABNORMAL HIGH (ref 0.4–1.2)
GFR calc Af Amer: 49 mL/min — ABNORMAL LOW (ref >60)
GFR calc non Af Amer: 40 mL/min — ABNORMAL LOW (ref >60)
Glucose, Bld: 90 mg/dL (ref 70–99)
Potassium: 3.8 mEq/L (ref 3.5–5.1)
Sodium: 136 mEq/L (ref 135–145)

## 2010-02-15 LAB — CBC
HCT: 31.3 % — ABNORMAL LOW (ref 36.0–46.0)
Hemoglobin: 10.1 g/dL — ABNORMAL LOW (ref 12.0–15.0)
MCH: 28.1 pg (ref 26.0–34.0)
MCHC: 32.3 g/dL (ref 30.0–36.0)
MCV: 86.9 fL (ref 78.0–100.0)
Platelets: 509 10*3/uL — ABNORMAL HIGH (ref 150–400)
RBC: 3.6 MIL/uL — ABNORMAL LOW (ref 3.87–5.11)
RDW: 14.9 % (ref 11.5–15.5)
WBC: 9.6 10*3/uL (ref 4.0–10.5)

## 2010-02-15 LAB — BASIC METABOLIC PANEL
BUN: 20 mg/dL (ref 6–23)
CO2: 30 mEq/L (ref 19–32)
Calcium: 9 mg/dL (ref 8.4–10.5)
Chloride: 94 mEq/L — ABNORMAL LOW (ref 96–112)
Creatinine, Ser: 1.36 mg/dL — ABNORMAL HIGH (ref 0.4–1.2)
GFR calc Af Amer: 49 mL/min — ABNORMAL LOW (ref >60)
GFR calc non Af Amer: 40 mL/min — ABNORMAL LOW (ref >60)
Glucose, Bld: 99 mg/dL (ref 70–99)
Potassium: 3.8 mEq/L (ref 3.5–5.1)
Sodium: 134 mEq/L — ABNORMAL LOW (ref 135–145)

## 2010-02-15 LAB — ANA: Anti Nuclear Antibody(ANA): NEGATIVE

## 2010-02-17 LAB — METHYLMALONIC ACID, SERUM: Methylmalonic Acid, Quantitative: 589 nmol/L — ABNORMAL HIGH (ref 87–318)

## 2010-02-17 NOTE — Procedures (Signed)
  NAMEBLOSSIE, RAFFEL              ACCOUNT NO.:  [id number]  MEDICAL RECORD NO.:  [id number]           PATIENT TYPE:  I  LOCATION:  3739                         FACILITY:  [facility]  PHYSICIAN:  Georga Hacking, M.D.DATE OF BIRTH:  11/09/1955  DATE OF PROCEDURE:  02/11/2010                           CARDIAC CATHETERIZATION   HISTORY:  A 55 year old female who has a left bundle-branch block and mild aortic regurgitation, who presented with a cardiomyopathy and pneumonia.  She has had recent extensive spinal surgery complicated by ARDS and aspiration and has a new ejection fraction of 25%.  The study is done to evaluate for coronary artery disease.  PROCEDURE:  Left heart catheterization with coronary angiograms and left ventriculogram.  COMMENTS ABOUT PROCEDURE:  The patient was brought to the catheterization laboratory and prepped and draped in the usual manner. After Xylocaine anesthesia, a 5-French sheath was placed in the right femoral percutaneously after several needle sticks.  Angiograms were made using 5-French catheters and a 30-degree ventriculogram was performed.  She was returned to the holding area for sheath removal. She tolerated the procedure well.  HEMODYNAMIC DATA:  Aorta postcontrast 89/54, LV postcontrast 89/11-17.  ANGIOGRAPHIC DATA:  Left ventriculogram:  Performed in the 30 degrees RAO projection.  The aortic valve is normal.  Mitral valve is normal. The left ventricle appeared normal in size.  There is a dyssynchronous pattern of contractility at the left ventricle.  There is global hypokinesis with an estimated ejection fraction of 25-30%. Coronary arteries:  Arise and distributes normally.  There is no coronary calcification noted.  The system is right-dominant.  The left anterior descending is a large vessel extending to the apex and appears angiographically normal without irregularities, calcification, or stenosis.  Two diagonal branches arise and  contained no disease. Circumflex coronary artery:  Large vessel containing 2 marginal branches and is free of disease.  The right coronary artery is a dominant vessel and contains posterior descending artery and 2 posterolateral branches which are large and is normal.  IMPRESSION: 1. Normal coronary arteries with no coronary calcification or     irregularities. 2. Cardiomyopathy with ejection fraction of 25-30% with global     hypokinesis.  RECOMMENDATIONS:  Intensive treatment for cardiomyopathy.  Review data from previous hospitalization.  Spironolactone, ACE inhibitor, beta- blocker, furosemide as needed.     Georga Hacking, M.D.     WST/MEDQ  D:  02/11/2010  T:  02/12/2010  Job:  [id number]  cc:   Tammy R. Collins Scotland, M.D. [employer]product [employer] by W. Donnie Aho M.D. on 02/17/2010 09:33:39 AM

## 2010-02-20 NOTE — Consult Note (Signed)
NAMEJAELEN, [name]              ACCOUNT NO.:  [id number]  MEDICAL RECORD NO.:  [id number]           PATIENT TYPE:  I  LOCATION:  3739                         FACILITY:  [facility]  PHYSICIAN:  Jake Bathe, MD      DATE OF BIRTH:  [birth year]  DATE OF CONSULTATION:  02/10/2010 DATE OF DISCHARGE:                                CONSULTATION   CARDIOLOGIST:  Georga Hacking, MD  REASON FOR CONSULTATION:  Evaluation of newly-discovered cardiomyopathy.  HISTORY OF PRESENT ILLNESS:  A 55 year old female with newly-discovered ejection fraction of 25% with recent hospitalization and surgery at [facility] for her severe scoliosis, which was prolonged secondary to likely aspiration pneumonia.  She was admitted here on February 09, 2010, with dyspnea and a nonproductive cough.  Chest x-ray revealed a very large pleural effusion, which on the 31st was taking care of by thoracentesis were 1.2 liters with a drain from the right side via ultrasound guidance.  An echocardiogram was performed earlier today, which demonstrated an ejection fraction of 25% with generalized hypokinesis with inferior wall worse than others per report.  When discussing this with the patient, she had no prior recollection of any cardiomyopathy, although her mother had died of heart failure.  She does not recall having any chest discomfort indicative of a myocardial infarction in the recent past, although like I stated above she did have a prolonged hospitalization at [facility] with intent.  Her sister was present and she recalls during that hospitalization having a respiratory therapist described to her the possibility of congestive heart failure secondary to her profound edema and difficulty with respiration.  She does not recall them performing an ultrasound at that time in the hospital and this was during a stay at [facility].  Back in March, she states that Dr. Donnie Aho had performed a EKG,  she believes prior to her shoulder surgery, but she does not recall whether or not he performed an echocardiogram or stress test at that time and unfortunately, I do not have access to his records at the moment of this dictation.  Regardless, she does have a cardiomyopathy and does not remember being told that she has had one.  She does not drink any alcohol, no illicit drug use.  She does admit to Raynaud phenomenon, osteoporosis and occasional migraines.  She has also had episodes of paroxysmal atrial fibrillation in her past medical history; however, during this hospitalization, has not demonstrated any atrial fibrillation.  She also has left bundle-branch block in her past medical history and in review of her EKGs within the chart.  Currently, she is feeling better, status post thoracentesis and her dyspnea has improved.  She is able to lay in bed currently with only mild shortness of breath.  She denies any chest pain currently.  No syncope.  PAST MEDICAL HISTORY: 1. Severe scoliosis, status post Harrington rod for surgery in the     1960s with revision recently in December at [facility] with     prolonged hospitalization secondary to aspiration pneumonia. 2. Osteoporosis. 3. Hypothyroidism, status post partial thyroidectomy per the patient,  on Synthroid therapy. 4. Fibroids. 5. Migraine headaches. 6. Left bundle-branch block. 7. Insomnia. 8. Anxiety. 9. Paroxysmal atrial fibrillation. 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  PAST SURGICAL HISTORY:  Cholecystectomy, hysterectomy, right thyroid lobectomy, left shoulder repair, scoliosis surgery.  FAMILY HISTORY:  The patient's father is alive at 67.  The patient's mother died at 31 from congestive heart failure per the patient.  She has four sisters, one of them has atrial fibrillation, status post ablation in [place], currently with her.  SOCIAL HISTORY:  She is married, lives with her husband in [place]. Married to  [place].  No alcohol.  No drug use.  She is a Runner, broadcasting/film/video.  ALLERGIES: 1. CODEINE. 2. PREDNISONE. 3. ROBAXIN. 4. FENTANYL PATCHES.  Current medications are quite extensive and include diltiazem 60 mg extended release one p.o. b.i.d.  She is also currently on Zosyn as well as Lasix IV.  See history of present illness for other medications, these are then cardiac meds.  REVIEW OF SYSTEMS:  Unless stated above, all other 12 review of systems are negative.  PHYSICAL EXAMINATION:  VITAL SIGNS:  Temperature 98.3, pulse 89-100, sinus rhythm, respirations 20, blood pressure currently 108/72 from 150/81, satting 98% on room air.  Ins and outs according to E-chart. She is total positive 560 yesterday and 580 today. GENERAL:  She is alert and oriented x3, sitting up comfortably in bed without any apparent dyspnea, here with her sister. EYES:  Well-perfused conjunctivae.  EOMI.  No scleral icterus. NECK:  Supple.  No lymphadenopathy.  No thyromegaly.  No carotid bruits. No JVD.  Prior thyroidectomy scar noted. LUNGS:  Crackles noted in the right lower lobes, left lower lobe is normal.  No wheezes. CARDIOVASCULAR:  Regular rate and rhythm.  I do believe I appreciated an S3 gallop.  Soft systolic murmur at apex.  Laterally was displaced PMI. ABDOMEN:  Soft, nontender, normoactive bowel sounds.  Surgical scar well healing. EXTREMITIES:  2+ pitting edema, bilateral lower extremities, left slightly worse than right.  Normal distal pulses 2+. SKIN:  Demonstrates mild Raynaud phenomenon in hands bilaterally with deep bluish hue. GU:  Deferred. RECTAL:  Deferred. NEUROLOGIC:  Nonfocal.  No tremors are noted.  DATA:  Echocardiogram demonstrates EF of 25-30%, severely reduced with diffuse hypokinesis worse in the inferior wall and the cavity size was moderately dilated.  There was also a mild regurgitation over the mitral and aortic valves and the left atrium was mildly dilated.  EKGs personally  reviewed showed left bundle-branch block and sinus rhythm.  Lab work demonstrates most recent creatinine of 1.1, potassium 3.4, white count of 10.9, hemoglobin of 9.8.  Blood cultures are no growth to date.  TSH was normal.  Troponin and serum troponins are all normal. BNP is 598 down to 488.  Chest x-ray shows much improved right pleural effusion with cardiomegaly.  CT angiogram of the chest shows no PE, but prior large right pleural effusion.  When comparing chest x-ray personally reviewed from this admission to prior chest x-ray from 2006, cardiac margins do appear to be slightly enlarged.  ASSESSMENT AND PLAN:  A 55 year old female with newly-discovered systolic heart failure, acute with ejection fraction of 25-30% with recent back surgery at [facility] in December, history of paroxysmal atrial fibrillation, left bundle-branch block, Raynaud phenomenon. 1. Acute systolic heart failure - questionable etiology.  She does not     drink alcohol.  No illicit drug use.  No hypertension.  She does     have Raynaud phenomenon, so possible  rheumatic etiology.  Her TSH     is normal, so not thyroid related.  Coronary artery disease remains     a possibility and will need to be investigated with heart     catheterization at some point.  I will make n.p.o. just in case Dr.     Donnie Aho would like to proceed with this given her recent     thoracentesis.  Could this also be a peri-surgical complication     either due to the stress from the surgery or perhaps a     periprocedural myocardial infarction.  Overall function is     generalized decreased, although the inferior wall was commented     upon as being weaker than others.  Coronary artery disease will     need to be ruled out.  Agree with Lasix and I will increase to 40 mg IV twice a day.  Continue to replete potassium.  I will also agree that she will need an ACE inhibitor as well as beta-blocker.  She currently is on diltiazem 60  mg twice a day and I will discontinue this and place her on metoprolol 12.5 mg twice a day.  She will likely be able to tolerate this very well and one may proceed with uptitration of this medication.  I will also start low-dose lisinopril at 2.5 mg once a day with hopeful uptitration soon. Her blood pressure is 108 systolic currently and I do not want to overmedicate at this point.  Blood pressure slightly low likely secondary to recent thoracentesis.  I have described her situation at length with both her and her sister. 1. Pleural effusion - status post thoracentesis, await serum, LDH and     protein.  Possible transudate from heart failure.  Also possibility     from recent pneumonia. 2. Hypothyroidism - on Synthroid.  TSH is normal. 3. All of her serum troponin levels were normal.  The point-of-care     was mildly elevated previously.  Her BNP is also slightly lower.     Jake Bathe, MD     MCS/MEDQ  D:  02/10/2010  T:  02/11/2010  Job:  [id number]  cc:   Georga Hacking, M.D.  Electronically Signed by Donato Schultz MD on 02/20/2010 54:27:06 PM

## 2010-02-21 NOTE — Discharge Summary (Signed)
[name], [name]              ACCOUNT NO.:  [id number]  MEDICAL RECORD NO.:  [id number]           PATIENT TYPE:  I  LOCATION:  3737                         FACILITY:  [facility]  PHYSICIAN:  Dana Barefoot, MD    DATE OF BIRTH:  1955/10/11  DATE OF ADMISSION:  02/08/2010 DATE OF DISCHARGE:  02/15/2010                              DISCHARGE SUMMARY   PRIMARY CARE PHYSICIAN:  Dana R. Collins Scotland, MD  CONSULTATIONS: 1. [name]. Dana Sires, MD, FCCP, Pulmonary. 2. W. Viann Fish, MD, Cardiology.  DISCHARGE DIAGNOSES: 1. New onset idiopathic nonischemic cardiomyopathy with ejection     fraction at 25%. 2. Healthcare versus aspiration pneumonia with parapneumonic effusion. 3. Anemia consistent with anemia of chronic disease.  MMA level is     pending. 4. Dyspnea, multifactorial secondary to acute congestive heart failure     pneumonia and pleural effusion.  PAST MEDICAL HISTORY.: 1. Osteoporosis. 2. Scoliosis status post surgery. 3. History of pneumonia. 4. Hypothyroidism. 5. Fibroid. 6. History of migraine headaches. 7. Left bundle-branch block. 8. Insomnia. 9. Anxiety. 10.Paroxysmal AFib. 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  PAST SURGICAL HISTORY: 1. Cholecystectomy. 2. Hysterectomy. 3. Right thyroid lobectomy with a pathology showing atypical     follicular cells. 4. Left shoulder repair for a torn rotator cuff. 5. Scoliosis surgery.  DISCHARGE MEDICATIONS: 1. Augmentin 875 mg p.o. twice daily. 2. Tylenol 650 mg by mouth every 8 hours as needed for pain. 3. Aspirin 81 mg p.o. daily. 4. Carvedilol 6.25 mg 1 tablet twice daily with meals. 5. Furosemide 40 mg p.o. daily. 6. Guaifenesin 600 mg 1 tablet twice daily as needed for cough. 7. Lisinopril 2.5 mg by mouth daily. 8. Potassium chloride 20 mEq p.o. daily. 9. Spironolactone 25 mg p.o. daily. 10.Imipramine 50 mg tablet 2 tablets by mouth twice daily. 11.Artificial tears 1 drop each eye daily as needed. 12.Calcium OTC 1 tablet by  mouth twice daily. 13.Lunesta 3 mg 1 tablet by mouth daily at bedtime. 14.Senna docusate 1 tablet by mouth daily. 15.Synthroid 125 mcg 1 tablet by mouth daily. 16.Tramadol 50 mg 1/2 tablet by mouth daily.  DISPOSITION AND FOLLOWUP:  Ms. Heslin will need to follow up with Dr. Sherene Schmidt within a week.  She has an appointment for February 24, 2010 at 8:45 a.m.  She will need to follow also with Dr. Donnie Schmidt for further care of nonischemic cardiomyopathy.  She will need to follow with her primary care physician also within 2-3 weeks.  STUDIES PERFORMED: 1. Chest x-ray on February 08, 2010 showed moderate enlargement of the     cardiac silhouette with mild vascular congestion pattern.  Minimal     right basilar atelectasis associated with elevated right     hemidiaphragm and small right pleural effusion.  Decubitus airway     on February 08, 2010 showed layering of a small mobile right thyroid     effusion. 2. February 08, 2010, CT angio showed no sign of pulmonary embolism.     Large right pleural effusion.  Complete collapse of the right middle lobe and right lower lobe and mild atelectasis in the right     upper lobe.  3. Ultrasound thoracentesis on February 09, 2010 results successful     ultrasound-guided diagnostic and therapeutic right thoracentesis     used yielding 1.2 L of pleural fluid. 4. Chest x-ray, February 11, 2010 showed stable moderate enlargement of     the cardiac silhouette.  There is also free accommodation of the     right pleural effusion with a small amount to moderate amount.     Interval increase in the infiltrate, airspace disease, atelectasis,     consolidation in the right lower lung. 5. Chest x-ray, February 14, 2010, slightly improved but still moderate-     sized partial layer in right pleural effusion, improved right lung     aeration. 6. 2-D echo February 10, 2010 show diffuse hypokinesis worse in the     inferior wall, the cavity size was moderately dilated.   Systolic     function was severely reduced.  The estimated ejection fraction was     25-30%.  Aortic valve mild regurgitation. 7. Cardiac catheterization performed by Dr. Donnie Schmidt showed normal     coronary arteries with no coronary calcification or irregularitis.     Cardiomyopathy with ejection fraction 25-30% with global     hypokinesis.  BRIEF HISTORY OF PRESENT ILLNESS:  This is a very pleasant 55 year old with past medical history of scoliosis.  She was admitted to [facility] for this surgery, which took place on December 31, 2009.  Her hospital stay was complicated by development of aspiration pneumonia and ventilatory dependent respiratory failure.  She was apparently in the hospital for 3 weeks and then subsequently discharged to rehab facility and ultimately returned home on January 30, 2010.  The patient has stated that since she has been home, she had progressive shortness of breath up to the point that she is not able to lie flat in the bed.  She has had a nonproductive cough.  HOSPITAL COURSE: 1. Dyspnea.  This was thought to be multifactorial secondary to     pleural effusion, pneumonia, and systolic heart failure     exacerbation.  The patient on the day of discharge had improved     condition.  No shortness of breath, able to lie down flat. 2. Pneumonia with parapneumonic pleural effusion.  The patient was     admitted to telemetry.  She was started on IV vancomycin and Zosyn     to cover for healthcare-associated pneumonia.  The patient received     a total of 4 days of IV vancomycin, then she was just continued on     Zosyn.  She received 6 days of IV Zosyn.  She will be discharged on     Augmentin 875 mg p.o. b.i.d. for 10 more days.  She had a     thoracentesis and pleural fluid was consistent with exudative     pleural effusion.  Her LDH ratio was 0.83.  Her protein ratio was     0.68.  This was consistent with exudative effusion.  Blood     cultures, no  growth.  Culture of bloody fluid and no organisms     seen.  Cell count was white blood cells at 1475, glucose 119, and     LDH 134.  Pulmonologist was consulted.  They recommend conservative     treatment.  A serial x-ray showed stabilization of pleural     effusion.  The patient responded to IV Lasix also.  She might have     also a  component of heart failure.  The patient will follow with     Dr. Sherene Schmidt within a week for further assessment. 3. Nonischemic cardiomyopathy, idiopathic, new onset.  Initially, the     patient presented with heart failure, elevated BNP at 598.  A 2-D     echo with results as above showed an ejection fraction of 25%.     Subsequently, Dr. Donnie Schmidt was consulted and he performed a cardiac     cath which showed normal coronary arteries.  The plan is to manage     the patient medically.  She was started on Lasix, initially 40 mg     IV t.i.d., then she was transitioned to 40 mg p.o. daily.  She was     started also on lisinopril 2.5 mg p.o. daily, spironolactone 25 mg     p.o. daily, and Coreg 6.25 p.o. b.i.d.  The patient will need to     follow with Dr. Donnie Schmidt for further adjustment of medications.  She     will need a repeated 2-D echo to assess ejection fraction.  Unclear     etiology of cardiomyopathy and ANA is pending at this time.  Please     follow result.  If ANA is positive, the patient may require a lupus     workup.  The patient's dyspnea has resolved. 4. Right large pleural effusion.  This was thought to be parapneumonic     effusion, component of CHF also.  The patient was treated with IV     antibiotics.  Plan is to continue with antibiotics. Dr. Sherene Schmidt will see     the patient within a week for further care. 5. Anemia.  Hemoglobin remained stable at 9.  Anemia panel show iron     at 50, total iron binding capacity 205, B12 302, folate 4.8,     ferritin 248.  This is consistent with anemia of chronic disease.     Hemoglobin remained stable.  Because  B12 was low normal and MMA     level is pending, consider B12 supplement as indicated. 6. Post surgery, the patient will need to follow with her surgeon for     further care. 7. On the date of discharge, the patient was in improved condition.     No shortness of breath.  DISCHARGE VITAL SIGNS:  Blood pressure 113/69, sat 95 on room air, pulse 84, temperature 97.5, and respirations 18.  DISCHARGE LABORATORY DATA:  White blood cell 9.6, hemoglobin 10.1, and platelets 509.  Sodium 134, potassium 3.8, chloride 94, CO2 30, glucose 99, BUN 20, and creatinine 1.36.  The patient will need also a BMET to follow renal function, now that she is on Lasix and ACE to follow potassium level.     Dana Barefoot, MD     BR/MEDQ  D:  02/15/2010  T:  02/16/2010  Job:  [id number]  cc:   [name], M.D. [name]. Dana Sires, MD, Tonny Bollman Georga Hacking, M.D.  Electronically Signed by Dana Barefoot MD on 02/21/2010 10:03:42 PM

## 2010-02-23 DIAGNOSIS — E039 Hypothyroidism, unspecified: Secondary | ICD-10-CM | POA: Insufficient documentation

## 2010-02-23 DIAGNOSIS — M4120 Other idiopathic scoliosis, site unspecified: Secondary | ICD-10-CM | POA: Insufficient documentation

## 2010-02-23 DIAGNOSIS — I480 Paroxysmal atrial fibrillation: Secondary | ICD-10-CM | POA: Insufficient documentation

## 2010-02-23 DIAGNOSIS — N319 Neuromuscular dysfunction of bladder, unspecified: Secondary | ICD-10-CM | POA: Insufficient documentation

## 2010-02-23 DIAGNOSIS — J189 Pneumonia, unspecified organism: Secondary | ICD-10-CM | POA: Insufficient documentation

## 2010-02-23 DIAGNOSIS — G4700 Insomnia, unspecified: Secondary | ICD-10-CM | POA: Insufficient documentation

## 2010-02-23 DIAGNOSIS — M810 Age-related osteoporosis without current pathological fracture: Secondary | ICD-10-CM | POA: Insufficient documentation

## 2010-02-23 DIAGNOSIS — Z8669 Personal history of other diseases of the nervous system and sense organs: Secondary | ICD-10-CM | POA: Insufficient documentation

## 2010-02-23 DIAGNOSIS — I447 Left bundle-branch block, unspecified: Secondary | ICD-10-CM | POA: Insufficient documentation

## 2010-02-24 ENCOUNTER — Other Ambulatory Visit: Payer: Self-pay | Admitting: Internal Medicine

## 2010-02-24 ENCOUNTER — Ambulatory Visit (INDEPENDENT_AMBULATORY_CARE_PROVIDER_SITE_OTHER): Payer: BC Managed Care – PPO | Admitting: Internal Medicine

## 2010-02-24 ENCOUNTER — Encounter: Payer: Self-pay | Admitting: Internal Medicine

## 2010-02-24 ENCOUNTER — Ambulatory Visit (INDEPENDENT_AMBULATORY_CARE_PROVIDER_SITE_OTHER)
Admission: RE | Admit: 2010-02-24 | Discharge: 2010-02-24 | Disposition: A | Discharge status: Home / Self Care | Payer: BC Managed Care – PPO | Source: Ambulatory Visit | Attending: Internal Medicine | Admitting: Internal Medicine

## 2010-02-24 DIAGNOSIS — J90 Pleural effusion, not elsewhere classified: Secondary | ICD-10-CM

## 2010-02-24 DIAGNOSIS — I5022 Chronic systolic (congestive) heart failure: Secondary | ICD-10-CM | POA: Insufficient documentation

## 2010-03-03 NOTE — Assessment & Plan Note (Signed)
Summary: Pulmonary/ ext post op f/u ov for R effusion   Visit Type:  Initial Consult Primary Provider/Referring Provider:  Dr. Herb Grays  CC:  Post hospital followup- PNA.  History of Present Illness: 55 yowf never smoker seen in consultation during admit [facility]12 -02/15/10   for ? HCAP vs chf p back surgery at [facility] requiring 3 week hosp part of which required mech vent.   February 24, 2010  1st pulmonary office eval cc f/u R Pleural effusion breathing better with activity and sleeping ok no 02 and IS  500 > 1500 cc and no cough. Pt denies any significant sore throat, dysphagia, itching, sneezing,  nasal congestion or excess secretions,  fever, chills, sweats, unintended wt loss, pleuritic or exertional cp, hempoptysis, change in activity tolerance  orthopnea pnd or leg swelling   Current Medications (verified): 1)  Omeprazole 10 Mg Cpdr (Omeprazole) .Marland Kitchen.. 1 Once Daily 2)  Imipramine Hcl 50 Mg Tabs (Imipramine Hcl) .... 2 Two Times A Day 3)  Mucinex 600 Mg Xr12h-Tab (Guaifenesin) .Marland Kitchen.. 1 Two Times A Day 4)  Carvedilol 6.25 Mg Tabs (Carvedilol) .Marland Kitchen.. 1 Two Times A Day 5)  Lisinopril 2.5 Mg Tabs (Lisinopril) .Marland Kitchen.. 1 Once Daily 6)  Fluticasone Propionate 50 Mcg/act Susp (Fluticasone Propionate) .Marland Kitchen.. 1 Spray Each Nostril Two Times A Day 7)  Relpax 40 Mg Tabs (Eletriptan Hydrobromide) .... As Directed As Needed For Migraines 8)  Klor-Con M20 20 Meq Cr-Tabs (Potassium Chloride Crys Cr) .Marland Kitchen.. 1 Once Daily 9)  Spironolactone 25 Mg Tabs (Spironolactone) .Marland Kitchen.. 1 Once Daily 10)  Levothroid 125 Mcg Tabs (Levothyroxine Sodium) .Marland Kitchen.. 1 Once Daily 11)  Acetaminophen 500 Mg Tabs (Acetaminophen) .... Per Bottle Directions As Needed 12)  Furosemide 40 Mg Tabs (Furosemide) .Marland Kitchen.. 1 Once Daily 13)  Lunesta 3 Mg Tabs (Eszopiclone) .Marland Kitchen.. 1 At Bedtime As Needed 14)  Augmentin 875-125 Mg Tabs (Amoxicillin-Pot Clavulanate) .Marland Kitchen.. 1 Two Times A Day Until Gone  Allergies: 1)  ! Codeine 2)  ! * Robaxin 3)  ! 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 Nl external ear canals without cough reflex NECK :  without JVD/Nodes/TM/ nl carotid upstrokes bilaterally LUNGS: no acc muscle use,  decreased bs r > l base, no rhonchi or wheeze CV:  RRR  no s3 or murmur or increase in P2, no edema  ABD:  soft and nontender with nl excursion in the supine position. No bruits or organomegaly, bowel sounds nl MS:  warm without deformities, calf tenderness, cyanosis or clubbing SKIN: warm and dry without lesions  NEURO:  alert, approp, no deficits     CXR  Procedure date:  02/24/2010  Findings:      Decreased small right pleural effusion with continued right lower lung atelectasis/consolidation.   Cardiomegaly and mild left basilar atelectasis.  Impression & Recommendations:  Problem # 1:  PLEURAL EFFUSION (ICD-511.9) Exudative by chemistries but assoc with chf clinically so may well be from parapneumonic process that blossomed in setting of occult LVDysfuction but doing much better now and and further w/u anticipated  Discussed in detail all the  indications, usual  risks and alternatives  relative to the benefits with patient who agrees to proceed with conservative  f/u   Problem # 2:  CHF CONGESTIVE HEART FAILURE (ICD-428.0) Well compensated on rx , followed by Dr Donnie Aho. Her updated medication list for this problem includes:    Carvedilol 6.25 Mg Tabs (Carvedilol) .Marland Kitchen... 1 two times a day    Lisinopril 2.5 Mg Tabs (Lisinopril) .Marland Kitchen... 1 once daily    Spironolactone 25 Mg Tabs (Spironolactone) .Marland Kitchen... 1 once daily    Furosemide 40 Mg Tabs (Furosemide) .Marland Kitchen... 1 once daily  Medications Added to Medication List This Visit: 1)  Omeprazole 10 Mg Cpdr (Omeprazole) .Marland Kitchen.. 1 once daily 2)  Imipramine Hcl 50 Mg Tabs (Imipramine hcl) .... 2 two times a day 3)  Mucinex 600 Mg Xr12h-tab (Guaifenesin) .Marland Kitchen.. 1 two times a day 4)  Carvedilol 6.25 Mg Tabs (Carvedilol) .Marland Kitchen.. 1 two times a day 5)  Lisinopril 2.5 Mg Tabs (Lisinopril) .Marland Kitchen.. 1 once daily 6)  Fluticasone Propionate 50 Mcg/act Susp (Fluticasone propionate) .Marland Kitchen.. 1 spray each nostril two times a day 7)  Relpax 40 Mg Tabs (Eletriptan hydrobromide) .... As directed as needed for migraines 8)  Klor-con M20 20 Meq Cr-tabs (Potassium chloride crys cr) .Marland Kitchen.. 1 once daily 9)  Spironolactone 25 Mg Tabs (Spironolactone) .Marland Kitchen.. 1 once daily 10)  Levothroid 125 Mcg Tabs (Levothyroxine sodium) .Marland Kitchen.. 1 once daily 11)  Acetaminophen 500 Mg Tabs (Acetaminophen) .... Per bottle directions as needed 12)  Furosemide 40 Mg Tabs (Furosemide) .Marland Kitchen.. 1 once daily 13)  Lunesta 3 Mg Tabs (Eszopiclone) .Marland Kitchen.. 1 at bedtime as needed 14)  Augmentin 875-125 Mg Tabs (Amoxicillin-pot clavulanate) .Marland Kitchen.. 1 two times a day until gone  Other Orders: Est. Patient Level IV (04540) T-2 View CXR (71020TC)  Patient Instructions: 1)  Please schedule a follow-up appointment in 6 weeks, sooner if needed    CXR  Procedure date:  02/24/2010  Findings:      Decreased small right pleural effusion with continued right lower lung atelectasis/consolidation.   Cardiomegaly and mild left basilar atelectasis.

## 2010-03-22 ENCOUNTER — Encounter: Payer: Self-pay | Admitting: Internal Medicine

## 2010-04-01 ENCOUNTER — Ambulatory Visit: Payer: BC Managed Care – PPO | Admitting: Internal Medicine

## 2010-04-04 LAB — URINALYSIS, ROUTINE W REFLEX MICROSCOPIC
Bilirubin Urine: NEGATIVE
Glucose, UA: NEGATIVE mg/dL
Hgb urine dipstick: NEGATIVE
Ketones, ur: NEGATIVE mg/dL
Nitrite: NEGATIVE
Protein, ur: NEGATIVE mg/dL
Specific Gravity, Urine: 1.014 (ref 1.005–1.030)
Urobilinogen, UA: 0.2 mg/dL (ref 0.0–1.0)
pH: 5.5 (ref 5.0–8.0)

## 2010-04-04 LAB — COMPREHENSIVE METABOLIC PANEL
ALT: 23 U/L (ref 0–35)
AST: 23 U/L (ref 0–37)
Albumin: 4 g/dL (ref 3.5–5.2)
Alkaline Phosphatase: 75 U/L (ref 39–117)
BUN: 20 mg/dL (ref 6–23)
CO2: 30 mEq/L (ref 19–32)
Calcium: 9.4 mg/dL (ref 8.4–10.5)
Chloride: 102 mEq/L (ref 96–112)
Creatinine, Ser: 0.86 mg/dL (ref 0.4–1.2)
GFR calc Af Amer: >60 mL/min (ref >60)
GFR calc non Af Amer: >60 mL/min (ref >60)
Glucose, Bld: 89 mg/dL (ref 70–99)
Potassium: 4 mEq/L (ref 3.5–5.1)
Sodium: 140 mEq/L (ref 135–145)
Total Bilirubin: 0.6 mg/dL (ref 0.3–1.2)
Total Protein: 6.9 g/dL (ref 6.0–8.3)

## 2010-04-04 LAB — DIFFERENTIAL
Basophils Absolute: 0 10*3/uL (ref 0.0–0.1)
Basophils Relative: 0 % (ref 0–1)
Eosinophils Absolute: 0.4 10*3/uL (ref 0.0–0.7)
Eosinophils Relative: 5 % (ref 0–5)
Lymphocytes Relative: 27 % (ref 12–46)
Lymphs Abs: 2.1 10*3/uL (ref 0.7–4.0)
Monocytes Absolute: 0.9 10*3/uL (ref 0.1–1.0)
Monocytes Relative: 11 % (ref 3–12)
Neutro Abs: 4.4 10*3/uL (ref 1.7–7.7)
Neutrophils Relative %: 57 % (ref 43–77)

## 2010-04-04 LAB — APTT: aPTT: 43 seconds — ABNORMAL HIGH (ref 24–37)

## 2010-04-04 LAB — CBC
HCT: 40 % (ref 36.0–46.0)
Hemoglobin: 13.3 g/dL (ref 12.0–15.0)
MCHC: 33.3 g/dL (ref 30.0–36.0)
MCV: 88 fL (ref 78.0–100.0)
Platelets: 358 10*3/uL (ref 150–400)
RBC: 4.55 MIL/uL (ref 3.87–5.11)
RDW: 13 % (ref 11.5–15.5)
WBC: 7.8 10*3/uL (ref 4.0–10.5)

## 2010-04-04 LAB — URINE MICROSCOPIC-ADD ON

## 2010-04-04 LAB — PROTIME-INR
INR: 1.02 (ref 0.00–1.49)
Prothrombin Time: 13.3 seconds (ref 11.6–15.2)

## 2010-04-07 ENCOUNTER — Ambulatory Visit: Payer: BC Managed Care – PPO | Admitting: Internal Medicine

## 2010-04-13 ENCOUNTER — Telehealth: Payer: Self-pay | Admitting: Internal Medicine

## 2010-04-13 ENCOUNTER — Ambulatory Visit (INDEPENDENT_AMBULATORY_CARE_PROVIDER_SITE_OTHER)
Admission: RE | Admit: 2010-04-13 | Discharge: 2010-04-13 | Disposition: A | Discharge status: Home / Self Care | Payer: BC Managed Care – PPO | Source: Ambulatory Visit | Attending: Internal Medicine | Admitting: Internal Medicine

## 2010-04-13 ENCOUNTER — Encounter: Payer: Self-pay | Admitting: Internal Medicine

## 2010-04-13 ENCOUNTER — Ambulatory Visit (INDEPENDENT_AMBULATORY_CARE_PROVIDER_SITE_OTHER): Payer: BC Managed Care – PPO | Admitting: Internal Medicine

## 2010-04-13 VITALS — BP 98/64 | HR 70 | Temp 97.2°F | Ht 67.0 in | Wt 138.0 lb

## 2010-04-13 DIAGNOSIS — J90 Pleural effusion, not elsewhere classified: Secondary | ICD-10-CM

## 2010-04-13 DIAGNOSIS — I509 Heart failure, unspecified: Secondary | ICD-10-CM

## 2010-04-13 NOTE — Patient Instructions (Signed)
We will call you with cxr result but if the fluid has cleared we will not need to follow you in this clinic  Please call again if we can help you in any way

## 2010-04-13 NOTE — Telephone Encounter (Signed)
Notes Recorded by Sandrea Hughs, MD on 04/13/2010 at 1:25 PM Call pt: Reviewed cxr > all better, no further xrays needed   Called, spoke with pt.  She was informed of above results and recs per Dr. Sherene Sires.  She verbalized understanding and voiced no further questions at this time.

## 2010-04-13 NOTE — Progress Notes (Signed)
  Subjective:    Patient ID: [name], female    DOB: 1955-09-29, 55 y.o.   MRN: [id number]  HPI 62 yowf never smoker seen in consultation during admit [facility] -02/15/10 for ? HCAP vs chf p back surgery at [facility] requiring 3 week hosp part of which required mech vent.   February 24, 2010 1st pulmonary office eval cc f/u R Pleural effusion breathing better with activity and sleeping ok no 02 and IS 500 > 1500 cc and no cough. rec rx chf, use IS, no pulmonary interventions  04/13/2010 ov for final cxr, no sob or cough, just weak, no orthostatic co's.  Pt denies any significant sore throat, dysphagia, itching, sneezing,  nasal congestion or excess/ purulent secretions,  fever, chills, sweats, unintended wt loss, pleuritic or exertional cp, hempoptysis, orthopnea pnd or leg swelling.    Also denies any obvious fluctuation of symptoms with weather or environmental changes or other aggravating or alleviating factors.       Allergies:  1) ! Codeine  2) ! * Robaxin  3) ! * Fentanyl Patch    Past Medical History:  CHF.....................................Marland KitchenDonnie Aho  - LHC 02/11/10 with nl coronaries, EF 25% global dysfunction  R Pl effusion  - Tcentesis 02/09/09 x 1.2 liters exudate with nl glucose > completely resolved by cxr 04/13/10 Primary Care....................... 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 Nl external ear canals without cough reflex  NECK : without JVD/Nodes/TM/ nl carotid upstrokes bilaterally  LUNGS: no acc muscle use,  no rhonchi or wheeze , clear to percussion bilaterally CV: RRR no s3 or murmur or increase in P2, no edema  ABD: soft and nontender with nl excursion in the supine position. No bruits or  organomegaly, bowel sounds nl  MS: warm without deformities, calf tenderness, cyanosis or clubbing          Assessment & Plan:  cxr 4/3 /12 *RADIOLOGY REPORT*  Clinical Data: Follow up pleural effusion. Shortness of breath.  CHEST - 2 VIEW  Comparison: 02/24/2010.  Findings: The right pleural effusion with associated atelectasis  and infiltrative densities seen on the previous study is no longer  evident. No left pleural effusion is seen. Lungs are free of  infiltrates. There is stable moderate enlargement of the cardiac  silhouette. Ectasia and nonaneurysmal calcification of the thoracic  aorta are seen. There is stable moderate scoliosis convexity to  the right. Surgical fixation rods in the thoracolumbar spine  appear unchanged.  IMPRESSION:  The right pleural effusion with associated atelectasis and  infiltrative densities seen on the previous study is no longer  evident. There is stable moderate enlargement of the cardiac  silhouette. Scoliosis and fixation rods appear stable

## 2010-04-14 NOTE — Assessment & Plan Note (Signed)
No further pulmonary f/u needed

## 2010-04-14 NOTE — Assessment & Plan Note (Signed)
CHF > f/u by Donnie Aho  - LHC 02/11/10 with nl coronaries, EF 25% global dysfunction

## 2010-04-14 NOTE — Progress Notes (Signed)
Quick Note:  Pt already notified of these results. See phone note dated 04/13/10. ______

## 2010-05-11 ENCOUNTER — Telehealth: Payer: Self-pay | Admitting: Internal Medicine

## 2010-05-11 NOTE — Telephone Encounter (Signed)
Please call the patient's pharmacy and ask them to contact her PCP Dr. Herb Grays with this medication request.  We received the following medication refill requests electronically for [name].  She is not a patient in our clinic and has never been. Thank You.    Patient Name:    [name], [name]    Patient DOB:     1955/06/03    Medication:      AMOX TR-K CLV 875-125 MG TAB   NDCNUM:          [id number]   Instructions:    TAKE 1 TABLET BY MOUTH TWICE A DAY   Quantity:        20 Tablet   Refills:         1   Written Data:    [id number]   Transaction ID:  [id number]   Date of Request: 02/26/2010 08:14:02    Pharmacy:        [facility]  [address] [id number]   Last Fill Date:  02/15/2010   Pharmacy Phone:  [phone]    Medication:      CARVEDILOL 6.25 MG TABLET   NDCNUM:          [id number]   Instructions:    TAKE 1 TABLET BY MOUTH TWICE A DAY WITH MEALS   Quantity:        60 Tablet   Refills:         1   Written Data:    [id number]   Transaction ID:  [id number]   Date of Request: 03/15/2010 11:34:47    Pharmacy:        [facility]  [address] [id number]   Last Fill Date:  02/15/2010   Pharmacy Phone:  [phone]    Medication:      LISINOPRIL 2.5 MG TABLET   NDCNUM:          [id number]   Instructions:    TAKE 1 TABLET BY MOUTH EVERY DAY   Quantity:        30 Tablet   Refills:         1   Written Data:    [id number]   Transaction ID:  [id number]   Date of Request: 03/15/2010 11:34:46    Pharmacy:        [facility]  [address] [id number]   Last Fill Date:  02/15/2010   Pharmacy Phone:  [phone]    Medication:      FUROSEMIDE 40 MG TABLET   NDCNUM:          [id number]   Instructions:    TAKE 1 TABLET BY MOUTH EVERY DAY   Quantity:        30 Tablet   Refills:         1   Written Data:    [id number]   Transaction ID:  [id number]   Date of Request: 03/15/2010 11:34:43    Pharmacy:        [facility]  [address] [id number]   Last Fill Date:  02/15/2010   Pharmacy Phone:  [phone]   Medication:      SPIRONOLACTONE 25 MG TABLET   NDCNUM:          [id number]   Instructions:    TAKE 1 TABLET BY MOUTH EVERY DAY   Quantity:        30 Tablet   Refills:         1   Written Data:    [id number]  Transaction ID:  [id number]   Date of Request: 03/15/2010 11:34:47    Ph[facility]acy:        [facility]  [address] [id number]   Last Fill Date:  02/15/2010   Pharmacy Phone:  [phone]

## 2010-05-25 NOTE — Discharge Summary (Signed)
NAMEJUSTYNE, ROELL              ACCOUNT NO.:  [id number]   MEDICAL RECORD NO.:  [id number]          PATIENT TYPE:  OIB   LOCATION:  9306                          FACILITY:  [facility]   PHYSICIAN:  M. Leda Quail, MD  DATE OF BIRTH:  January 27, 1955   DATE OF ADMISSION:  12/10/2007  DATE OF DISCHARGE:  12/11/2007                               DISCHARGE SUMMARY   ADMISSION DIAGNOSES:  1. Third-degree uterine prolapse.  2. Uterine fibroids.  3. Postmenopausal bleeding.  4. History of partial thyroidectomy resulting in hypothyroidism.  5. Migraines.   DISCHARGE DIAGNOSES:  1. Third-degree uterine prolapse.  2. Uterine fibroids.  3. Postmenopausal bleeding.  4. History of partial thyroidectomy resulting in hypothyroidism.  5. Migraines.  6. Left bundle-branch block.   PROCEDURE:  Transvaginal hysterectomy.   HISTORY OF PRESENT ILLNESS:  In brief, Ms. Latendresse is a very nice 55-  year-old G1, P1 married white female with third-degree uterine prolapse  who has considered surgical options secondary to worsening symptoms.  She also has a history of uterine fibroids.  The patient had some  postmenopausal bleeding the weekend right before surgery.  We decided to  go ahead and proceed to surgery and not postpone to proceed with workup  as this is a brand new and in September she had an ultrasound that  showed endometrial thickness of 2.6 mm.  The patient has account of  risks and benefits and this is documented in the office chart.   HOSPITAL COURSE:  The patient was admitted to same-day surgery.  She was  taken to the operating room where transvaginal hysterectomy was  performed.  She had about 50 mL of blood loss.  Her ovaries appeared  normal at the time of surgery, and therefore they were left in place.  The patient did well during the surgery, although once the EKG leads  were attached at the beginning of the procedure, the patient did have a  left bundle-branch block.  I did not know  of this history, but we felt  safe to go ahead and proceed with the surgery.  This was completed in a  timely fashion and after the surgery was complete, she was transferred  to recovery room.  After appropriate time in recovery room, the patient  was transferred to the third floor for the remainder of her  hospitalization.  She did have some trouble with nausea, which I felt  was attributed to increasing the PCA dose.  She did receive Zofran and  Compazine for this.  Ultimately, the patient's nausea resolved in the  night when she stopped using her PCA.  By the morning of postoperative  day #1, she was ambulating, tolerating liquids, and voiding without  difficulty.  Her T-max during the hospital stay was 98.0, pulse ranged  between 74 and 90, respiratory rate 16 and 22, blood pressure 97 to 106  over 56-66, pulse ox was 98% on room air.  She had made 1300 mL plus of  urine output.  Blood work showed hemoglobin of 12.3, white count 9.5,  platelet count of 328.  Electrolytes were  all normal.  BUN and  creatinine were 9 and 0.6 respectively.  Her exam was benign.  She had a  soft abdomen with good bowel sounds and it was nondistended.  Her  perineum was dry.  At this point, the patient will be advanced to  regular diet and her IV will be removed.  If she continues to do well  this morning, we will plan to send her home later today.  She has been  given instructions in the verbal form and she will be provided written  instructions before she is to go home.  She will see me in 1 week.  She  has prescriptions now for Vicodin 5/500, 1-2 tablets p.o. q.4-6 h.  p.r.n. pain, and Motrin 800 mg 1 every 8 hours as needed for pain.  She  is also going to use either MiraLax or Colace to help prevent  constipation.  She will be discharged to home with family.      Lum Keas, MD  Electronically Signed     MSM/MEDQ  D:  12/11/2007  T:  12/11/2007  Job:  [id number]

## 2010-05-25 NOTE — Op Note (Signed)
[name], [name]              ACCOUNT NO.:  [id number]   MEDICAL RECORD NO.:  [id number]          PATIENT TYPE:  OIB   LOCATION:  9306                          FACILITY:  [facility]   PHYSICIAN:  M. Leda Quail, MD  DATE OF BIRTH:  [birth year]   DATE OF PROCEDURE:  12/10/2007  DATE OF DISCHARGE:                               OPERATIVE REPORT   DATE OF PROCEDURE:  December 10, 2007.   PREOPERATIVE DIAGNOSES:  73. A 55 year old G1, P1 married white female with third-degree uterine      prolapse.  2. Uterine fibroids.  3. Postmenopausal bleeding, which started yesterday.  4. History of partial thyroidectomy.  5. Migraines.   POSTOPERATIVE DIAGNOSES:  46. A 55 year old G1, P1 married white female with third-degree uterine      prolapse.  2. Uterine fibroids.  3. Postmenopausal bleeding, which started yesterday.  4. History of partial thyroidectomy.  5. Migraines.  6. Bundle-branch block noted on cardiac monitoring during the      procedure.   PROCEDURE:  Total vaginal hysterectomy.   SURGEON:  M. Leda Quail, MD   ASSISTANT:  Edwena Felty. Romine, MD   ANESTHESIA:  General endotracheal.   FINDINGS:  Large globular uterus with multiple fibroids including a 3-  to 4-cm fundal fibroid, small postmenopausal-appearing ovaries, and  small bluish paratubal cyst that is still consistent with endometriosis  or old blood-filled cyst on the fallopian tube.   SPECIMENS:  Uterus and cervix sent to Pathology.   ESTIMATED BLOOD LOSS:  50 mL.   URINE OUTPUT:  140 mL of clear urine drained with the catheterization at  the beginning and end of the procedure.   FLUIDS:  2000 mL of LR.   INDICATIONS:  [name] is a very nice 55 year old G1, P1 married  white female with a history of uterine prolapse as well as uterine  fibroids.  She was sent initially in referral from Dr. Smith Mince.  She  is relatively healthy lady who has a history of hypothyroidism secondary  to a partial  thyroidectomy.  She also has significant trouble with  migraines.  She has been on Prempro and she also has some trouble with  tear at the opening of the vagina in that she also often has pain during  intercourse.  I initially had her using some Premarin vaginal cream for  this, which has been unsuccessful in improving the problem.  Due to her  prolapse, we talked about conservative measures and surgical  intervention.  She is not interested in using a pessary at all and was  interested in proceeding with a hysterectomy.  She did have a greater  than 9-pound baby from her vaginal delivery and I felt that a vaginal  approach is appropriate for this patient.  She has been on Prempro, and  when I talked to her this morning before the surgery, she reported that  she had some light spotting yesterday and this morning.  Normally, I  would proceed with an endometrial biopsy, but she has not had this in  the past, and this is pretty new.  I  feel a likelihood of abnormal  findings on pathology are relatively low.  She wishes to proceed today  and not wait for a biopsy result.  She did have an ultrasound in  September 2009, which did show an endometrium of 2.6 mm.  The patient  has been counseled about risks and benefits, and she presents for the  procedure today.   DESCRIPTION OF PROCEDURE:  The patient was taken to the operating room.  She was placed in a supine position.  As a cardiac monitoring was  initiated, a bundle-branch block was noted.  Dr. Malen Gauze was the  anesthesiologist overseeing the case and we discussed this finding.  As  our concern for significant blood loss is relatively low, we felt safe  to proceed, and we will do an EKG in the PACU after the surgery.  I have  also discussed this with her primary care doctor.   General endotracheal anesthesia was administered by the anesthesia staff  without difficulty.  Legs were then positioned in the lithotomy  position.  She had been  positioned in the lithotomy position to ensure  comfort of her low back before the surgery was begun.  The legs were  positioned in [place] stirrups with SCDs on the lower extremities  bilaterally.  The legs were positioned in the high-lithotomy position.  Abdomen, perineum, inner thighs, and vagina were prepped and draped in  normal sterile fashion.  In-and-out catheterization was performed to  drain the bladder of all urine.   Attention was turned to the vagina.  Heavy weight speculum was placed in  the posterior aspect of the vagina.  The cervix was grasped with a  Jacobson tenaculum.  A 10 mL of 1% lidocaine mixed 1:1 IV with  epinephrine (200,000 units) was instilled around the cervix.  The  patient was placed in a tiny bit of Trendelenburg.  The posterior  vaginal mucosa was entered sharply.  A figure-of-eight suture was used  to attach vaginal mucosa to the posterior peritoneum.  A weighted  speculum was placed through this incision.  The uterosacral ligaments on  each side were clamped with a curved Heaney clamp, transected, and  suture ligated with stay sutures of #0 Vicryl.  The stitches left along.  The cervix was then circumferentially incised with cautery.  Then,  anteriorly, the pubovesical cervical fascia was dissected sharply with  Metzenbaum scissors.  The operator's index finger with an open Ray-Tec  was then used to identify the plane between the bladder and the cervix.  This was identified and a Comptroller was placed in this plane.  The  anterior peritoneum was quite high and the decision was made to go ahead  and continue with the dissection of the cardinal ligaments bilaterally.  Cardinal ligaments were then serially clamped, transected, and suture  ligated with #0 Vicryl.  At this point, I felt that I was about the  level of the uterine arteries bilaterally.  The reflection of the  anterior peritoneum was much more visible at this point and that was  grasped  with an Allis clamp and incised sharply.  This incision was  opened and a Kirby retractor was placed through this incision without  difficulty.  The fundus and the uterus was visualized.  The uterine  arteries were then clamped on each side.  They were transected and  suture ligated with 2 stitches of #0 Vicryl.  Then the beginning of the  broad ligament was clamped, transected,and sutured with #0  Vicryl.  The  anterior peritoneum that was on the uterine side was incorporated into  this stitch.  At this point, uterus could be flipped and brought through  the vaginal opening.  There was a fairly sizable fundal fibroid that was  noted.  With this maneuver and the patient's vagal down, her pulse  slowed quite significantly.  The decision was made to place uterus back  in normal anatomic position, and using knife, the midsection of the  uterus was wedged out.  The sides of the fundus of the uterus could be  brought together at this point and delivered through the uterus, so that  the Heaney clamps could be placed across the utero-ovarian pedicles  without as much pulling on the patient's pelvis.  The curved Heaney  clamps were placed across the utero-ovarian ligaments bilaterally  without difficulty.  These pedicles were transected.  Remainder of the  uterus was delivered through the vaginal opening.  The ovaries were  visualized bilaterally.  On the left fallopian tube, there was what  appeared to be a paratubal cyst with bluish coloration in the cyst.  This could either represent small blood or endometriosis and this was  removed and sent with the pathologic specimen.  The utero-ovarian  pedicles were then first tied with free tie and then with a Heaney  stitch of #0 Vicryl.  Pedicles were inspected, no bleeding was noted.  Moistened lap tape was placed in the incision to spill out of the way  and the pedicles were inspected, no bleeding was noted.  At this point,  a long #0 Vicryl was  obtained.  This was used to run the cuff starting  at the 2 o'clock position.  The anterior peritoneum and vaginal mucosa  was incorporated in the stitch and was brought around the vaginal  opening to the 3 and 6 o'clock position incorporating the posterior  peritoneum and posterior vaginal mucosa and then back to 10 o'clock  position.  This stitch was then tied and cut.  The open lap tape was  removed and warm normal saline was used to irrigate the vagina.  No  bleeding was noted.  No adhesions or other abnormal findings noted.  At  this point, an uterosacral ligament plication stitch was placed.  This  was placed through the posterior vaginal mucosa and peritoneum.  The  medial third of the left uterosacral ligament was incorporated in the  stitch.  The posterior peritoneum was incorporated in the stitch as it  was reefed across, and the medial third of the right uterosacral  ligament was incorporated in the stitch.  This was brought back through  the posterior vaginal mucosa and posterior peritoneum and tied tightly  bringing the uterosacral ligaments together at the vaginal cuff.  The  vaginal cuff was then closed with 3 figure-of-eight sutures of #0  Vicryl.  The lap tape was removed before the final stitch was placed.  No bleeding was noted at the end of the procedure.  An in-and-out  catheterization was also performed and there was clear urine noted.  There was good support of the vaginal apex, which was nice down in the  vagina.  The bladder had a small cystocele present, but not significant  enough to proceed with a cystocele repair.  The patient has no  incontinence symptoms and I do not feel that further surgery was going  to bring her any benefit at this point.   She does have a small area  at the vaginal opening at the base of the  introitus, where she has some fissuring from time to time.  She and I  discussed biopsy of this tissue.  About 1 mL of 1% lidocaine was  instilled  in this area superficially.  Using Metzenbaum scissors, the  areas and abnormal tissue was excised, and thus mucosa was closed with 2  subcuticular stitches of 2-0 Vicryl.   At this point, the procedure was ended.  Sponge, lap, needle, and  instrument counts were correct x2.  The patient's legs were positioned  back in the supine position after the Betadine prep was cleaned off her  skin.  She tolerated the procedure well.  She was awakened from  anesthesia without difficulty, extubated, and taken to the recovery room  in stable condition.      Lum Keas, MD  Electronically Signed     MSM/MEDQ  D:  12/10/2007  T:  12/11/2007  Job:  [id number]   cc:   Talmadge Coventry, M.D.  Fax: [phone]

## 2010-08-24 DIAGNOSIS — K2100 Gastro-esophageal reflux disease with esophagitis, without bleeding: Secondary | ICD-10-CM | POA: Insufficient documentation

## 2010-08-24 DIAGNOSIS — G609 Hereditary and idiopathic neuropathy, unspecified: Secondary | ICD-10-CM | POA: Insufficient documentation

## 2010-09-20 DIAGNOSIS — D6109 Other constitutional aplastic anemia: Secondary | ICD-10-CM | POA: Insufficient documentation

## 2010-10-12 LAB — URINALYSIS, ROUTINE W REFLEX MICROSCOPIC
Glucose, UA: NEGATIVE mg/dL
Ketones, ur: NEGATIVE mg/dL
Nitrite: NEGATIVE
Protein, ur: NEGATIVE mg/dL
Specific Gravity, Urine: 1.02 (ref 1.005–1.030)
Urobilinogen, UA: 0.2 mg/dL (ref 0.0–1.0)
pH: 6.5 (ref 5.0–8.0)

## 2010-10-12 LAB — CBC
HCT: 42.8 % (ref 36.0–46.0)
Hemoglobin: 14.8 g/dL (ref 12.0–15.0)
MCHC: 34.7 g/dL (ref 30.0–36.0)
MCV: 90.7 fL (ref 78.0–100.0)
Platelets: 390 10*3/uL (ref 150–400)
RBC: 4.72 MIL/uL (ref 3.87–5.11)
RDW: 12.6 % (ref 11.5–15.5)
WBC: 11.5 10*3/uL — ABNORMAL HIGH (ref 4.0–10.5)

## 2010-10-12 LAB — BASIC METABOLIC PANEL
BUN: 15 mg/dL (ref 6–23)
CO2: 30 mEq/L (ref 19–32)
Calcium: 8.9 mg/dL (ref 8.4–10.5)
Chloride: 104 mEq/L (ref 96–112)
Creatinine, Ser: 0.79 mg/dL (ref 0.4–1.2)
GFR calc Af Amer: >60 mL/min (ref >60)
GFR calc non Af Amer: >60 mL/min (ref >60)
Glucose, Bld: 100 mg/dL — ABNORMAL HIGH (ref 70–99)
Potassium: 3.7 mEq/L (ref 3.5–5.1)
Sodium: 139 mEq/L (ref 135–145)

## 2010-10-12 LAB — URINE MICROSCOPIC-ADD ON

## 2010-10-12 LAB — APTT: aPTT: 37 seconds (ref 24–37)

## 2010-10-12 LAB — PROTIME-INR
INR: 0.9 (ref 0.00–1.49)
Prothrombin Time: 12.1 seconds (ref 11.6–15.2)

## 2010-10-12 LAB — TYPE AND SCREEN
ABO/RH(D): O NEG
Antibody Screen: NEGATIVE

## 2010-10-12 LAB — PREGNANCY, URINE: Preg Test, Ur: NEGATIVE

## 2010-10-12 LAB — ABO/RH: ABO/RH(D): O NEG

## 2010-10-15 LAB — BASIC METABOLIC PANEL
BUN: 9 mg/dL (ref 6–23)
CO2: 27 mEq/L (ref 19–32)
Calcium: 7.7 mg/dL — ABNORMAL LOW (ref 8.4–10.5)
Chloride: 109 mEq/L (ref 96–112)
Creatinine, Ser: 0.6 mg/dL (ref 0.4–1.2)
GFR calc Af Amer: >60 mL/min (ref >60)
GFR calc non Af Amer: >60 mL/min (ref >60)
Glucose, Bld: 127 mg/dL — ABNORMAL HIGH (ref 70–99)
Potassium: 3.7 mEq/L (ref 3.5–5.1)
Sodium: 138 mEq/L (ref 135–145)

## 2010-10-15 LAB — CBC
HCT: 35.7 % — ABNORMAL LOW (ref 36.0–46.0)
Hemoglobin: 12.3 g/dL (ref 12.0–15.0)
MCHC: 34.5 g/dL (ref 30.0–36.0)
MCV: 91.4 fL (ref 78.0–100.0)
Platelets: 328 10*3/uL (ref 150–400)
RBC: 3.9 MIL/uL (ref 3.87–5.11)
RDW: 12.5 % (ref 11.5–15.5)
WBC: 9.5 10*3/uL (ref 4.0–10.5)

## 2010-11-17 DIAGNOSIS — M5106 Intervertebral disc disorders with myelopathy, lumbar region: Secondary | ICD-10-CM

## 2010-11-17 HISTORY — DX: Intervertebral disc disorders with myelopathy, lumbar region: M51.06

## 2010-12-15 ENCOUNTER — Encounter: Payer: Self-pay | Admitting: Internal Medicine

## 2010-12-15 ENCOUNTER — Encounter: Payer: Self-pay | Admitting: *Deleted

## 2010-12-15 ENCOUNTER — Ambulatory Visit (INDEPENDENT_AMBULATORY_CARE_PROVIDER_SITE_OTHER): Payer: BC Managed Care – PPO | Admitting: Internal Medicine

## 2010-12-15 VITALS — BP 114/70 | HR 88 | Resp 18 | Ht 67.0 in | Wt 152.2 lb

## 2010-12-15 DIAGNOSIS — I519 Heart disease, unspecified: Secondary | ICD-10-CM

## 2010-12-15 DIAGNOSIS — I509 Heart failure, unspecified: Secondary | ICD-10-CM

## 2010-12-15 NOTE — Assessment & Plan Note (Signed)
The patient has class III symptoms. She has an underlying dilated cardiomyopathy and has been treated with maximal medical therapy. She has no coronary disease. She has chronic left bundle branch block. I discussed the treatment options with the patient. The risks, goals, benefits, and expectations of ICD implantation have been discussed with the patient. We also discussed biventricular pacing. We have agreed that a biventricular defibrillator seems like the best initial option for this patient. Device implantation was scheduled at the earliest possible convenient time

## 2010-12-15 NOTE — Patient Instructions (Signed)
Your physician has recommended that you have a defibrillator inserted. An implantable cardioverter defibrillator (ICD) is a small device that is placed in your chest or, in rare cases, your abdomen. This device uses electrical pulses or shocks to help control life-threatening, irregular heartbeats that could lead the heart to suddenly stop beating (sudden cardiac arrest). Leads are attached to the ICD that goes into your heart. This is done in the hospital and usually requires an overnight stay. Please see the instruction sheet given to you today for more information.  She is scheduled for a Bi-V ICD on 12/27/10

## 2010-12-15 NOTE — Progress Notes (Signed)
HPI [name] for today by Dr. Donnie Aho for evaluation of chronic systolic heart failure and for consideration for a biventricular device. The patient has a history of back problems. She also has a history of chronic left bundle branch block. The patient underwent an extensive surgical procedure on her back over a year ago which was complicated by pneumonia, respiratory failure, and congestive heart failure. She was actually rehospitalized after returning to [facility] for acute on chronic congestive heart failure and was found to have severe left ventricular dysfunction. She has been treated with maximal medical therapy under the direction of Dr. Donnie Aho she is on ACE inhibitors, beta blockers, and diuretics. Her heart failure is class III. Her most recent ejection fraction is 30%. She has a history of syncope which dates back to childhood. She denies anginal symptoms. She has chronic left bundle branch block with a QRS duration of 175 ms. She denies peripheral edema. Allergies  Allergen Reactions  . Codeine   . Fentanyl Other (See Comments)    Bad dreams  . Methocarbamol   . Morphine And Related      Current Outpatient Prescriptions  Medication Sig Dispense Refill  . acetaminophen (TYLENOL) 500 MG tablet Per bottle directions as needed       . carvedilol (COREG) 12.5 MG tablet Take 12.5 mg by mouth 2 (two) times daily with a meal.        . fluticasone (VERAMYST) 27.5 MCG/SPRAY nasal spray 1 spray by Nasal route 2 (two) times daily.        . furosemide (LASIX) 40 MG tablet Take 20 mg by mouth daily. 1 tablet daily      . imipramine (TOFRANIL) 50 MG tablet 2 twice daily       . levothyroxine (SYNTHROID, LEVOTHROID) 125 MCG tablet 1 tablet daily       . lisinopril (PRINIVIL,ZESTRIL) 2.5 MG tablet Take 2.5 mg by mouth daily.        Marland Kitchen spironolactone (ALDACTONE) 25 MG tablet 1 tablet daily       . guaiFENesin (MUCINEX) 600 MG 12 hr tablet 1 twice daily       . omeprazole (PRILOSEC) 10 MG capsule 1  capsule daily          Past Medical History  Diagnosis Date  . Pleural effusion, right     thoracentesis 02/09/09   . CHF (congestive heart failure)     Dr. Donnie Aho  . Cardiomyopathy   . Hypertension     essential- benign  . Aortic valve disorder   . LBBB (left bundle branch block)     conduction disorder  . Thyroid disease     hypothyroidism  . Migraines   . Osteoporosis     ROS:   All systems reviewed and negative except as noted in the HPI.   Past Surgical History  Procedure Date  . Cholecystectomy   . Vesicovaginal fistula closure w/ tah   . Thyroid lobectomy     rt  . Rotator cuff repair     lft     Family History  Problem Relation Age of Onset  . Heart disease Mother   . Heart disease Sister      History   Social History  . Marital Status: Married    Spouse Name: N/A    Number of Children: N/A  . Years of Education: N/A   Occupational History  . Teacher    Social History Main Topics  . Smoking status: Never Smoker   .  Smokeless tobacco: Not on file  . Alcohol Use: No  . Drug Use: No  . Sexually Active: Not on file   Other Topics Concern  . Not on file   Social History Narrative  . No narrative on file     BP 114/70  Pulse 88  Resp 18  Ht 5\' 7"  (1.702 m)  Wt 69.037 kg (152 lb 3.2 oz)  BMI 23.84 kg/m2  Physical Exam:  Well appearing middle-aged woman, NAD HEENT: Unremarkable Neck:  No JVD, no thyromegally Lungs:  Clear with no wheezes, rales, or rhonchi. HEART:  Regular rate rhythm, no murmurs, no rubs, no clicks. S2 is split Abd:  soft, positive bowel sounds, no organomegally, no rebound, no guarding Ext:  2 plus pulses, no edema, no cyanosis, no clubbing Skin:  No rashes no nodules Neuro:  CN II through XII intact, motor grossly intact  EKG Normal sinus rhythm with left bundle branch block and a QRS duration of 175 ms.   Assess/Plan:

## 2010-12-16 ENCOUNTER — Telehealth: Payer: Self-pay | Admitting: Internal Medicine

## 2010-12-16 NOTE — Telephone Encounter (Signed)
Moved procedure to 01/19/10 at 7:30 am arrive at hospital at 5:30am with labs on 01/16/10 at 3:30  [name] is aware and will change lab appointment as well as paper work  [name] changed at the EP lab

## 2010-12-16 NOTE — Telephone Encounter (Signed)
New Msg: Pt calling wanting to speak with the nurse about rs surgery date to January. Please return pt call to discuss further.

## 2010-12-20 ENCOUNTER — Other Ambulatory Visit: Payer: BC Managed Care – PPO | Admitting: *Deleted

## 2011-01-05 ENCOUNTER — Encounter (HOSPITAL_COMMUNITY): Payer: Self-pay

## 2011-01-17 ENCOUNTER — Telehealth: Payer: Self-pay | Admitting: Internal Medicine

## 2011-01-17 ENCOUNTER — Other Ambulatory Visit (INDEPENDENT_AMBULATORY_CARE_PROVIDER_SITE_OTHER): Payer: BC Managed Care – PPO | Admitting: *Deleted

## 2011-01-17 DIAGNOSIS — I509 Heart failure, unspecified: Secondary | ICD-10-CM

## 2011-01-17 DIAGNOSIS — I519 Heart disease, unspecified: Secondary | ICD-10-CM

## 2011-01-17 NOTE — Telephone Encounter (Signed)
Fu call °Pt returning your call  °

## 2011-01-17 NOTE — Telephone Encounter (Signed)
New msg Pt is calling procedure she is having on Thursday. Please call her back

## 2011-01-17 NOTE — Telephone Encounter (Signed)
Patient came in for her labs and confirmed time

## 2011-01-17 NOTE — Telephone Encounter (Signed)
Called patient back and lmom for patient again.

## 2011-01-18 LAB — BASIC METABOLIC PANEL
BUN: 23 mg/dL (ref 6–23)
CO2: 31 mEq/L (ref 19–32)
Calcium: 9.1 mg/dL (ref 8.4–10.5)
Chloride: 101 mEq/L (ref 96–112)
Creatinine, Ser: 1.1 mg/dL (ref 0.4–1.2)
GFR: 55.53 mL/min — ABNORMAL LOW (ref >60.00)
Glucose, Bld: 64 mg/dL — ABNORMAL LOW (ref 70–99)
Potassium: 3.7 mEq/L (ref 3.5–5.1)
Sodium: 139 mEq/L (ref 135–145)

## 2011-01-18 LAB — CBC WITH DIFFERENTIAL/PLATELET
Basophils Absolute: 0 10*3/uL (ref 0.0–0.1)
Basophils Relative: 0.5 % (ref 0.0–3.0)
Eosinophils Absolute: 0.1 10*3/uL (ref 0.0–0.7)
Eosinophils Relative: 1.8 % (ref 0.0–5.0)
HCT: 36 % (ref 36.0–46.0)
Hemoglobin: 12.4 g/dL (ref 12.0–15.0)
Lymphocytes Relative: 22.7 % (ref 12.0–46.0)
Lymphs Abs: 1.8 10*3/uL (ref 0.7–4.0)
MCHC: 34.6 g/dL (ref 30.0–36.0)
MCV: 92.5 fl (ref 78.0–100.0)
Monocytes Absolute: 0.8 10*3/uL (ref 0.1–1.0)
Monocytes Relative: 9.9 % (ref 3.0–12.0)
Neutro Abs: 5.2 10*3/uL (ref 1.4–7.7)
Neutrophils Relative %: 65.1 % (ref 43.0–77.0)
Platelets: 334 10*3/uL (ref 150.0–400.0)
RBC: 3.89 Mil/uL (ref 3.87–5.11)
RDW: 13.4 % (ref 11.5–14.6)
WBC: 8.1 10*3/uL (ref 4.5–10.5)

## 2011-01-18 LAB — PROTIME-INR
INR: 0.9 ratio (ref 0.8–1.0)
Prothrombin Time: 10.3 s (ref 10.2–12.4)

## 2011-01-20 ENCOUNTER — Encounter (HOSPITAL_COMMUNITY)
Admission: RE | Disposition: A | Discharge status: Home / Self Care | Payer: Self-pay | Source: Ambulatory Visit | Attending: Internal Medicine

## 2011-01-20 ENCOUNTER — Ambulatory Visit (HOSPITAL_COMMUNITY)
Admission: RE | Admit: 2011-01-20 | Discharge: 2011-01-21 | Disposition: A | Discharge status: Home / Self Care | Payer: BC Managed Care – PPO | Source: Ambulatory Visit | Attending: Internal Medicine | Admitting: Internal Medicine

## 2011-01-20 ENCOUNTER — Encounter (HOSPITAL_COMMUNITY): Payer: Self-pay | Admitting: General Practice

## 2011-01-20 ENCOUNTER — Ambulatory Visit (HOSPITAL_COMMUNITY): Payer: BC Managed Care – PPO

## 2011-01-20 ENCOUNTER — Ambulatory Visit (HOSPITAL_COMMUNITY): Admit: 2011-01-20 | Payer: Self-pay | Admitting: Internal Medicine

## 2011-01-20 DIAGNOSIS — I509 Heart failure, unspecified: Secondary | ICD-10-CM | POA: Insufficient documentation

## 2011-01-20 DIAGNOSIS — G43909 Migraine, unspecified, not intractable, without status migrainosus: Secondary | ICD-10-CM | POA: Insufficient documentation

## 2011-01-20 DIAGNOSIS — Z8669 Personal history of other diseases of the nervous system and sense organs: Secondary | ICD-10-CM | POA: Insufficient documentation

## 2011-01-20 DIAGNOSIS — I428 Other cardiomyopathies: Secondary | ICD-10-CM

## 2011-01-20 DIAGNOSIS — I5022 Chronic systolic (congestive) heart failure: Secondary | ICD-10-CM | POA: Insufficient documentation

## 2011-01-20 DIAGNOSIS — E039 Hypothyroidism, unspecified: Secondary | ICD-10-CM | POA: Insufficient documentation

## 2011-01-20 DIAGNOSIS — I447 Left bundle-branch block, unspecified: Secondary | ICD-10-CM | POA: Insufficient documentation

## 2011-01-20 DIAGNOSIS — F419 Anxiety disorder, unspecified: Secondary | ICD-10-CM

## 2011-01-20 HISTORY — DX: Anxiety disorder, unspecified: F41.9

## 2011-01-20 HISTORY — DX: Nausea with vomiting, unspecified: R11.2

## 2011-01-20 HISTORY — DX: Other specified postprocedural states: Z98.890

## 2011-01-20 HISTORY — DX: Hypothyroidism, unspecified: E03.9

## 2011-01-20 HISTORY — DX: Other complications of anesthesia, initial encounter: T88.59XA

## 2011-01-20 HISTORY — DX: Unspecified malignant neoplasm of skin, unspecified: C44.90

## 2011-01-20 HISTORY — PX: BI-VENTRICULAR IMPLANTABLE CARDIOVERTER DEFIBRILLATOR: SHX5459

## 2011-01-20 HISTORY — DX: Raynaud's syndrome without gangrene: I73.00

## 2011-01-20 HISTORY — DX: Adverse effect of unspecified anesthetic, initial encounter: T41.45XA

## 2011-01-20 LAB — SURGICAL PCR SCREEN
MRSA, PCR: NEGATIVE
Staphylococcus aureus: NEGATIVE

## 2011-01-20 SURGERY — BI-VENTRICULAR IMPLANTABLE CARDIOVERTER DEFIBRILLATOR  (CRT-D)
Anesthesia: Moderate Sedation | Laterality: Left

## 2011-01-20 SURGERY — BI-VENTRICULAR IMPLANTABLE CARDIOVERTER DEFIBRILLATOR REVISION (CRT-R)
Anesthesia: LOCAL

## 2011-01-20 MED ORDER — FLUTICASONE PROPIONATE 50 MCG/ACT NA SUSP
1.0000 | Freq: Two times a day (BID) | NASAL | Status: DC
  Administered 2011-01-20 – 2011-01-21 (×2): 1 via NASAL
  Filled 2011-01-20: qty 16

## 2011-01-20 MED ORDER — SPIRONOLACTONE 25 MG PO TABS
25.0000 mg | ORAL_TABLET | Freq: Every day | ORAL | Status: DC
  Administered 2011-01-21: 25 mg via ORAL
  Filled 2011-01-20: qty 1

## 2011-01-20 MED ORDER — POLYETHYLENE GLYCOL 3350 17 G PO PACK
17.0000 g | PACK | Freq: Every day | ORAL | Status: DC | PRN
  Filled 2011-01-20: qty 1

## 2011-01-20 MED ORDER — CEFAZOLIN SODIUM-DEXTROSE 2-3 GM-% IV SOLR: 2.0000 g | INTRAVENOUS | Status: DC

## 2011-01-20 MED ORDER — FENTANYL CITRATE 0.05 MG/ML IJ SOLN
INTRAMUSCULAR | Status: AC
  Filled 2011-01-20: qty 2

## 2011-01-20 MED ORDER — MUPIROCIN 2 % EX OINT
TOPICAL_OINTMENT | CUTANEOUS | Status: AC
  Administered 2011-01-20: 1 via NASAL
  Filled 2011-01-20: qty 22

## 2011-01-20 MED ORDER — ACETAMINOPHEN 325 MG PO TABS
650.0000 mg | ORAL_TABLET | Freq: Four times a day (QID) | ORAL | Status: DC
  Administered 2011-01-21: 650 mg via ORAL
  Filled 2011-01-20: qty 2

## 2011-01-20 MED ORDER — IMIPRAMINE HCL 50 MG PO TABS
100.0000 mg | ORAL_TABLET | Freq: Every day | ORAL | Status: DC
  Administered 2011-01-20: 100 mg via ORAL
  Filled 2011-01-20 (×4): qty 2

## 2011-01-20 MED ORDER — CEFAZOLIN SODIUM 1-5 GM-% IV SOLN
1.0000 g | Freq: Four times a day (QID) | INTRAVENOUS | Status: AC
  Administered 2011-01-20 – 2011-01-21 (×3): 1 g via INTRAVENOUS
  Filled 2011-01-20 (×2): qty 50

## 2011-01-20 MED ORDER — LIDOCAINE HCL (PF) 1 % IJ SOLN
INTRAMUSCULAR | Status: AC
  Filled 2011-01-20: qty 60

## 2011-01-20 MED ORDER — YOU HAVE A PACEMAKER BOOK
Freq: Once | Status: AC
  Administered 2011-01-20: 22:00:00
  Filled 2011-01-20: qty 1

## 2011-01-20 MED ORDER — FUROSEMIDE 20 MG PO TABS
20.0000 mg | ORAL_TABLET | Freq: Every day | ORAL | Status: DC
  Administered 2011-01-20 – 2011-01-21 (×2): 20 mg via ORAL
  Filled 2011-01-20 (×2): qty 1

## 2011-01-20 MED ORDER — LIDOCAINE HCL (PF) 1 % IJ SOLN
INTRAMUSCULAR | Status: AC
  Filled 2011-01-20: qty 30

## 2011-01-20 MED ORDER — CEFAZOLIN SODIUM 1-5 GM-% IV SOLN
1.0000 g | INTRAVENOUS | Status: DC
  Filled 2011-01-20 (×3): qty 50

## 2011-01-20 MED ORDER — LEVOTHYROXINE SODIUM 125 MCG PO TABS
125.0000 ug | ORAL_TABLET | Freq: Every day | ORAL | Status: DC
  Administered 2011-01-21: 125 ug via ORAL
  Filled 2011-01-20 (×2): qty 1

## 2011-01-20 MED ORDER — HEPARIN (PORCINE) IN NACL 2-0.9 UNIT/ML-% IJ SOLN
INTRAMUSCULAR | Status: AC
  Filled 2011-01-20: qty 1000

## 2011-01-20 MED ORDER — ONDANSETRON HCL 4 MG/2ML IJ SOLN: 4.0000 mg | Freq: Four times a day (QID) | INTRAMUSCULAR | Status: DC | PRN

## 2011-01-20 MED ORDER — CARVEDILOL 12.5 MG PO TABS
12.5000 mg | ORAL_TABLET | Freq: Two times a day (BID) | ORAL | Status: DC
  Administered 2011-01-20 – 2011-01-21 (×2): 12.5 mg via ORAL
  Filled 2011-01-20 (×3): qty 1

## 2011-01-20 MED ORDER — LORATADINE 10 MG PO TABS
10.0000 mg | ORAL_TABLET | Freq: Every day | ORAL | Status: DC
  Administered 2011-01-21: 10 mg via ORAL
  Filled 2011-01-20: qty 1

## 2011-01-20 MED ORDER — NAPHAZOLINE-PHENIRAMINE 0.025-0.3 % OP SOLN
1.0000 [drp] | Freq: Every day | OPHTHALMIC | Status: DC | PRN
  Filled 2011-01-20: qty 15

## 2011-01-20 MED ORDER — SODIUM CHLORIDE 0.9 % IV SOLN: 250.0000 mL | INTRAVENOUS | Status: DC | PRN

## 2011-01-20 MED ORDER — SODIUM CHLORIDE 0.9 % IJ SOLN
3.0000 mL | Freq: Two times a day (BID) | INTRAMUSCULAR | Status: DC
  Administered 2011-01-20: 3 mL via INTRAVENOUS

## 2011-01-20 MED ORDER — ACETAMINOPHEN 325 MG PO TABS: 325.0000 mg | ORAL_TABLET | ORAL | Status: DC | PRN

## 2011-01-20 MED ORDER — ACETAMINOPHEN 500 MG PO TABS
1000.0000 mg | ORAL_TABLET | Freq: Two times a day (BID) | ORAL | Status: DC
  Administered 2011-01-20 – 2011-01-21 (×2): 1000 mg via ORAL
  Filled 2011-01-20 (×3): qty 2

## 2011-01-20 MED ORDER — SODIUM CHLORIDE 0.9 % IJ SOLN
3.0000 mL | INTRAMUSCULAR | Status: DC | PRN
  Administered 2011-01-20: 3 mL via INTRAVENOUS

## 2011-01-20 MED ORDER — MIDAZOLAM HCL 5 MG/5ML IJ SOLN
INTRAMUSCULAR | Status: AC
  Filled 2011-01-20: qty 5

## 2011-01-20 MED ORDER — ELETRIPTAN HYDROBROMIDE 40 MG PO TABS
40.0000 mg | ORAL_TABLET | ORAL | Status: DC | PRN
  Administered 2011-01-20: 40 mg via ORAL
  Filled 2011-01-20: qty 1

## 2011-01-20 MED ORDER — SODIUM CHLORIDE 0.9 % IR SOLN
80.0000 mg | Status: DC
  Filled 2011-01-20: qty 2

## 2011-01-20 MED ORDER — SODIUM CHLORIDE 0.9 % IV SOLN: INTRAVENOUS | Status: DC

## 2011-01-20 MED ORDER — SODIUM CHLORIDE 0.45 % IV SOLN: INTRAVENOUS | Status: DC

## 2011-01-20 MED ORDER — LISINOPRIL 2.5 MG PO TABS
2.5000 mg | ORAL_TABLET | Freq: Every day | ORAL | Status: DC
  Administered 2011-01-21: 2.5 mg via ORAL
  Filled 2011-01-20: qty 1

## 2011-01-20 MED ORDER — LIVING BETTER WITH HEART FAILURE BOOK
Freq: Once | Status: AC
  Administered 2011-01-20: 23:00:00
  Filled 2011-01-20: qty 1

## 2011-01-20 MED ORDER — DOCUSATE SODIUM 100 MG PO CAPS
100.0000 mg | ORAL_CAPSULE | Freq: Two times a day (BID) | ORAL | Status: DC
  Administered 2011-01-20 – 2011-01-21 (×2): 100 mg via ORAL
  Filled 2011-01-20 (×3): qty 1

## 2011-01-20 NOTE — Op Note (Signed)
Biv ICD implanted via left subclavian vein without immediate complication. W#[id number].

## 2011-01-20 NOTE — Op Note (Signed)
NAMEJANEI, SCHEFF NO.:  [id number]  MEDICAL RECORD NO.:  [id number]  LOCATION:  2505                         FACILITY:  [facility]  PHYSICIAN:  Doylene Canning. Ladona Ridgel, MD    DATE OF BIRTH:  [birth year]  DATE OF PROCEDURE:  01/20/2011 DATE OF DISCHARGE:                              OPERATIVE REPORT   PROCEDURE PERFORMED:  Insertion of a biventricular ICD.  INDICATION:  Nonischemic cardiomyopathy, EF 25%, left bundle-branch block, class 3 heart failure despite maximal medical therapy.  INTRODUCTION:  The patient is a 56 year old woman who has a history of severe left ventricular dysfunction, left bundle-branch block, and congestive heart failure.  Despite maximal medical therapy, she has had continued symptoms and she is now referred for a Bi-V ICD insertion.  PROCEDURE:  After informed consent was obtained, the patient was taken to the diagnostic EP lab in a fasting state.  After usual preparation and draping, intravenous fentanyl and midazolam was given for sedation. Lidocaine 30 mL was infiltrated into the left infraclavicular region.  A 7-cm incision was carried out over this region.  Electrocautery was utilized to dissect down to the fascial plane.  The left subclavian vein was punctured x3 and the Medtronic model 6935, 58 cm active fixation defibrillation lead serial number [id number] was advanced into the right ventricle and the Medtronic model 5076, 45 cm active fixation pacing lead serial number [id number] was advanced into the right atrium.  Mapping was carried out in the right ventricle.  At the final site on the RV apical septum, the R-waves measured 23 mV, the pace impedance was 1100 ohms, and the threshold was 0.9 V at 0.5 msec. Attention was then turned to placement of the atrial lead.  The P-waves were 2 mV, the pacing impedance was 580 ohms, and the threshold was a V at 0.5 msec.  10 V pacing both in the atrium and ventricle did not stimulate the  diaphragm.  With the atrial and ventricular leads in satisfactory position, attention was then turned to placement of left ventricular lead.  The coronary sinus guiding catheter (MB2) was advanced along with a 6-French HexaPolar EP catheter into the right atrium.  The coronary sinus was cannulated without particular difficulty.  Venography of the coronary sinus was carried out.  This demonstrated a lateral vein with 2 branches.  The superior branch was chosen for LV lead insertion.  The Medtronic Attain Ability 4196, 88 cm LV pacing lead, serial number [id number] was advanced into the lateral vein utilizing a 0.014 angioplasty guidewire.  Mapping in the lateral vein was carried out.  At the final site, the LV threshold was around 2.5 V at 0.8 msec.  It should be noted that on other sites the thresholds were higher.  With these satisfactory parameters, the guiding catheter was removed in the usual manner.  At this point, the leads were secured to the subpectoral fascia with a figure-of-eight silk suture. Sewing sleeve was secured with silk suture.  Electrocautery was utilized to assure hemostasis.  Electrocautery was utilized to make a subcutaneous pocket.  The Medtronic Consulta CRT-D Bi-V ICD, serial number [id number] was connected to the atrial  RV and LV leads and placed back into the subcutaneous pocket.  The pocket was irrigated with antibiotic irrigation and the incision was closed with 2-0 and 3-0 Vicryl.  At this point, I scrubbed out of the case and assisted in administering additional intravenous sedation for defibrillation threshold testing.  After the patient was more deeply sedated under my direct supervision, VF was induced with a T-wave shock and a 15-joule shock was delivered, which terminated ventricular fibrillation and restored sinus rhythm.  At this point, no additional defibrillation threshold testing was carried out.  The incision was closed with 2-0 Vicryl and  the patient was returned to her room in satisfactory condition.  COMPLICATIONS:  There were no immediate procedure complications.  RESULTS:  This demonstrates successful implantation of Medtronic Bi-V ICD in a patient with longstanding congestive heart failure and cardiomyopathy, EF 20%.     Doylene Canning. Ladona Ridgel, MD     GWT/MEDQ  D:  01/20/2011  T:  01/20/2011  Job:  [id number]  cc:   Georga Hacking, M.D.

## 2011-01-20 NOTE — Progress Notes (Signed)
UR Completed.  [name] 01/20/2011 [phone]  

## 2011-01-20 NOTE — H&P (Signed)
EP admit note: HPI  [name] is referred today by Dr. Donnie Aho for evaluation of chronic systolic heart failure and for consideration for a biventricular device. The patient has a history of back problems. She also has a history of chronic left bundle branch block. The patient underwent an extensive surgical procedure on her back over a year ago which was complicated by pneumonia, respiratory failure, and congestive heart failure. She was actually rehospitalized after returning to [facility] for acute on chronic congestive heart failure and was found to have severe left ventricular dysfunction. She has been treated with maximal medical therapy under the direction of Dr. Donnie Aho she is on ACE inhibitors, beta blockers, and diuretics. Her heart failure is class III. Her most recent ejection fraction is 30%. She has a history of syncope which dates back to childhood. She denies anginal symptoms. She has chronic left bundle branch block with a QRS duration of 175 ms. She denies peripheral edema.  Allergies   Allergen  Reactions   .  Codeine    .  Fentanyl  Other (See Comments)     Bad dreams   .  Methocarbamol    .  Morphine And Related     Current Outpatient Prescriptions   Medication  Sig  Dispense  Refill   .  acetaminophen (TYLENOL) 500 MG tablet  Per bottle directions as needed     .  carvedilol (COREG) 12.5 MG tablet  Take 12.5 mg by mouth 2 (two) times daily with a meal.     .  fluticasone (VERAMYST) 27.5 MCG/SPRAY nasal spray  1 spray by Nasal route 2 (two) times daily.     .  furosemide (LASIX) 40 MG tablet  Take 20 mg by mouth daily. 1 tablet daily     .  imipramine (TOFRANIL) 50 MG tablet  2 twice daily     .  levothyroxine (SYNTHROID, LEVOTHROID) 125 MCG tablet  1 tablet daily     .  lisinopril (PRINIVIL,ZESTRIL) 2.5 MG tablet  Take 2.5 mg by mouth daily.     Marland Kitchen  spironolactone (ALDACTONE) 25 MG tablet  1 tablet daily     .  guaiFENesin (MUCINEX) 600 MG 12 hr tablet  1 twice daily     .   omeprazole (PRILOSEC) 10 MG capsule  1 capsule daily      Past Medical History   Diagnosis  Date   .  Pleural effusion, right      thoracentesis 02/09/09   .  CHF (congestive heart failure)      Dr. Donnie Aho   .  Cardiomyopathy    .  Hypertension      essential- benign   .  Aortic valve disorder    .  LBBB (left bundle branch block)      conduction disorder   .  Thyroid disease      hypothyroidism   .  Migraines    .  Osteoporosis     ROS:  All systems reviewed and negative except as noted in the HPI.  Past Surgical History   Procedure  Date   .  Cholecystectomy    .  Vesicovaginal fistula closure w/ tah    .  Thyroid lobectomy      rt   .  Rotator cuff repair      lft    Family History   Problem  Relation  Age of Onset   .  Heart disease  Mother    .  Heart disease  Sister     History    Social History   .  Marital Status:  Married     Spouse Name:  N/A     Number of Children:  N/A   .  Years of Education:  N/A    Occupational History   .  Teacher     Social History Main Topics   .  Smoking status:  Never Smoker   .  Smokeless tobacco:  Not on file   .  Alcohol Use:  No   .  Drug Use:  No   .  Sexually Active:  Not on file    Other Topics  Concern   .  Not on file    Social History Narrative   .  No narrative on file    BP 114/70  Pulse 88  Resp 18  Ht 5\' 7"  (1.702 m)  Wt 69.037 kg (152 lb 3.2 oz)  BMI 23.84 kg/m2  Physical Exam:  Well appearing middle-aged woman, NAD  HEENT: Unremarkable  Neck: No JVD, no thyromegally  Lungs: Clear with no wheezes, rales, or rhonchi.  HEART: Regular rate rhythm, no murmurs, no rubs, no clicks. S2 is split  Abd: soft, positive bowel sounds, no organomegally, no rebound, no guarding  Ext: 2 plus pulses, no edema, no cyanosis, no clubbing  Skin: No rashes no nodules  Neuro: CN II through XII intact, motor grossly intact  EKG  Normal sinus rhythm with left bundle branch block and a QRS duration of 175 ms.    Assess/Plan:   Chronic systolic heart failure - Lewayne Bunting, MD 12/15/2010 5:24 PM Signed  The patient has class III symptoms. She has an underlying dilated cardiomyopathy and has been treated with maximal medical therapy. She has no coronary disease. She has chronic left bundle branch block. I discussed the treatment options with the patient. The risks, goals, benefits, and expectations of ICD implantation have been discussed with the patient. We also discussed biventricular pacing. We have agreed that a biventricular defibrillator seems like the best initial option for this patient. Device implantation was scheduled at the earliest possible convenient time  EP Addendum: Since prior clinic visit, no change in the history, physical exam or plan. For BiV ICD. I have discussed the risks/benefits/goals/and expectations of the procedure with the patient and she wishes to proceed.  Lewayne Bunting, M.D.

## 2011-01-21 ENCOUNTER — Other Ambulatory Visit: Payer: Self-pay

## 2011-01-21 ENCOUNTER — Encounter (HOSPITAL_COMMUNITY): Payer: Self-pay | Admitting: *Deleted

## 2011-01-21 ENCOUNTER — Encounter (HOSPITAL_COMMUNITY): Payer: Self-pay

## 2011-01-21 ENCOUNTER — Ambulatory Visit (HOSPITAL_COMMUNITY): Payer: BC Managed Care – PPO

## 2011-01-21 DIAGNOSIS — I428 Other cardiomyopathies: Secondary | ICD-10-CM

## 2011-01-21 NOTE — Discharge Instructions (Signed)
Supplemental Discharge Instructions for  Defibrillator Patients  Activity No heavy lifting or vigorous activity with your left/right arm for 4 to 6 weeks.  Do not raise your left/right arm above your head for one week.  Gradually raise your affected arm as drawn below.          01/25/2011                  01/26/2011                01/27/2011              01/28/2011 NO DRIVING for 1 week; you may begin driving on 1/61/0960. WOUND CARE   Keep the wound area clean and dry.  Do not get this area wet for one week. No showers for one week; you may shower on 01/28/2011.   The tape/steri-strips on your wound will fall off; do not pull them off.  No bandage is needed on the site.  DO  NOT apply any creams, oils, or ointments to the wound area.   If you notice any drainage or discharge from the wound, any swelling or bruising at the site, or you develop a fever > 101? F after you are discharged home, call the office at once.  Special Instructions   You are still able to use cellular telephones; use the ear opposite the side where you have your pacemaker/defibrillator.  Avoid carrying your cellular phone near your device.   When traveling through airports, show security personnel your identification card to avoid being screened in the metal detectors.  Ask the security personnel to use the hand wand.   Avoid arc welding equipment, MRI testing (magnetic resonance imaging), TENS units (transcutaneous nerve stimulators).  Call the office for questions about other devices.   Avoid electrical appliances that are in poor condition or are not properly grounded.   Microwave ovens are safe to be near or to operate.  Additional information for defibrillator patients should your device go off:   If your device goes off ONCE and you feel fine afterward, notify the device clinic nurses.   If your device goes off ONCE and you do not feel well afterward, call 911.   If your device goes off TWICE, call 911.   If your  device goes off THREE times in one day, call 911.  DO NOT DRIVE YOURSELF OR A FAMILY MEMBER WITH A DEFIBRILLATOR TO THE HOSPITAL--CALL 911.  PLEASE REMEMBER TO BRING ALL OF YOUR MEDICATIONS TO EACH OF YOUR FOLLOW-UP OFFICE VISITS.

## 2011-01-21 NOTE — Progress Notes (Signed)
Clinical Social Worker met with patient regarding advanced directive request. CSW provided patient the advanced directive packet and patient was appreciative. Patient stated that she knows a notary who can notarize the document when it is completed. CSW will sign off and social work intervention is no longer needed. Please consult [place] again if new needs arise.   [name] MSW, [employer] [phone]

## 2011-01-21 NOTE — Discharge Summary (Signed)
Discharge Summary   Patient ID: [name],  MRN: [id number], DOB/AGE: [birth year] 56 y.o.  Admit date: 01/20/2011 Discharge date: 01/21/2011  Discharge Diagnoses Principal Problem:  *Dilated cardiomyopathy Active Problems:  HYPOTHYROIDISM  MIGRAINE HEADACHE  Chronic systolic heart failure   Allergies Allergies  Allergen Reactions  . Codeine Other (See Comments)    Chest pain  . Fentanyl Other (See Comments)    Bad dreams  . Methocarbamol Other (See Comments)    Chest pain.  Marland Kitchen Morphine And Related     Procedures  Bi-V ICD Implantation  PROCEDURE PERFORMED: Insertion of a biventricular ICD.   INDICATION: Nonischemic cardiomyopathy, EF 25%, left bundle-branch block, class 3 heart failure despite maximal medical therapy.   INTRODUCTION: The patient is a 56 year old woman woman who has a history of severe left ventricular dysfunction, left bundle-branch block, and congestive heart failure. Despite maximal medical therapy, she has had continued symptoms and she is now referred for a Bi-V ICD insertion.   PROCEDURE: After informed consent was obtained, the patient was taken to the diagnostic EP lab in a fasting state. After usual preparation and draping, intravenous fentanyl and midazolam was given for sedation. Lidocaine 30 mL was infiltrated into the left infraclavicular region. A 7-cm incision was carried out over this region. Electrocautery was utilized to dissect down to the fascial plane. The left subclavian vein was punctured x3 and the Medtronic model 6935, 58 cm active fixation defibrillation lead serial number [id number] was advanced into the right ventricle and the Medtronic model 5076, 45 cm active fixation pacing lead serial number [id number] was advanced into the right  atrium. Mapping was carried out in the right ventricle. At the final site on the RV apical septum, the R-waves measured 23 mV, the pace impedance was 1100 ohms, and the threshold was 0.9 V at 0.5  msec. Attention was then turned to placement of the atrial lead. The P-waves were 2 mV, the pacing impedance was 580 ohms, and the threshold was a V at 0.5 msec. 10 V pacing both in the atrium and ventricle did not stimulate the diaphragm. With the atrial and ventricular leads in satisfactory position, attention was then turned to placement of left ventricular lead. The coronary sinus guiding catheter (MB2) was advanced along with a 6-French HexaPolar EP catheter into the right  atrium. The coronary sinus was cannulated without particular difficulty. Venography of the coronary sinus was carried out. This demonstrated a lateral vein with 2 branches. The superior branch was chosen for LV lead insertion. The Medtronic Attain Ability 4196, 88 cm LV pacing lead, serial number [id number] was advanced into the lateral  vein utilizing a 0.014 angioplasty guidewire. Mapping in the lateral vein was carried out. At the final site, the LV threshold was around 2.5 V at 0.8 msec. It should be noted that on other sites the thresholds were higher. With these satisfactory parameters, the guiding catheter was removed in the usual manner. At this point, the leads were  secured to the subpectoral fascia with a figure-of-eight silk suture. Sewing sleeve was secured with silk suture. Electrocautery was utilized to assure hemostasis. Electrocautery was utilized to make a subcutaneous pocket. The Medtronic Consulta CRT-D Bi-V ICD, serial number [id number] was connected to the atrial RV and LV leads and  placed back into the subcutaneous pocket. The pocket was irrigated with antibiotic irrigation and the incision was closed with 2-0 and 3-0 Vicryl. At this point, I scrubbed out of the  case and assisted in administering additional intravenous sedation for defibrillation threshold testing. After the patient was more deeply sedated under my direct supervision, VF was induced with a T-wave shock and a 15-joule shock was delivered, which  terminated ventricular fibrillation and restored sinus rhythm. At this point, no additional defibrillation threshold testing was carried out. The incision was closed with 2-0 Vicryl and the patient was returned to her room in satisfactory condition.   COMPLICATIONS: There were no immediate procedure complications.   RESULTS: This demonstrates successful implantation of Medtronic Bi-V ICD in a patient with longstanding congestive heart failure and cardiomyopathy, EF 20%.  History of Present Illness   [name] is a pt of Dr. York Spaniel referred for consideration of Bi-V ICD implantation. She has a history of dilated cardiomyopathy, chronic systolic congestive heart failure (EF 20%, on maximal medication therapy including ACEI, BB, diuretics), hx of syncope and LBBB who underwent elective Bi-V ICD implantation on 1/10.   Hospital Course   She was informed, consented and agreed to the procedure outlined above in which a Medtronic Bi-V ICD was successfully implanted. Chest x-ray before and after the procedure were normal. Pt remained stable and asymptomatic denying chest pain, shortness of breath or any new concerns the following day. The device was interrogated in detail and found to be normal. She is currently in good condition and will be discharged home. She will follow-up with wound check in approx 7-10 days. She will have a follow-up appointment with Dr. Ladona Ridgel in approx 3 months. Activity restrictions with corresponding time frames and additional instructions post-ICD placement have been outlined and will be reviewed with the patient. No new meds/changes to her home medications.   Discharge Vitals:  Blood pressure 107/47, pulse 70, temperature 97.6 F (36.4 C), temperature source Oral, resp. rate 16, height 5' 7.5" (1.715 m), weight 69.9 kg (154 lb 1.6 oz), SpO2 100.00%.   Weight change: -0.861 kg (-1 lb 14.4 oz)  Labs:  Lab 01/17/11 1513  NA 139  K 3.7  CL 101  CO2 31  BUN 23    CREATININE 1.1  CALCIUM 9.1  PROT --  BILITOT --  ALKPHOS --  ALT --  AST --  AMYLASE --  LIPASE --  GLUCOSE 64*   Disposition:  Discharge Orders    Future Appointments: Provider: Department: Dept Phone: Center:   01/31/2011 9:00 AM Vella Kohler [facility] [phone] [facility]     Follow-up Information    Follow up with [facility] on 01/31/2011. (At 9:00 AM for wound check of defibrillator implantation. )    Contact information:   [address] [place] [address]       Follow up with Lewayne Bunting, MD. (Will call/mail letter with appointment date and time. )    Contact information:   [address] [place] [address] [phone]         Discharge Medications:  Medication List  As of 01/21/2011  9:27 AM   CONTINUE taking these medications         acetaminophen 500 MG tablet   Commonly known as: TYLENOL      carvedilol 12.5 MG tablet   Commonly known as: COREG      docusate sodium 100 MG capsule   Commonly known as: COLACE      eletriptan 40 MG tablet   Commonly known as: RELPAX      fluticasone 50 MCG/ACT nasal spray  Commonly known as: FLONASE      furosemide 40 MG tablet   Commonly known as: LASIX      imipramine 50 MG tablet   Commonly known as: TOFRANIL      levothyroxine 125 MCG tablet   Commonly known as: SYNTHROID, LEVOTHROID      lisinopril 2.5 MG tablet   Commonly known as: PRINIVIL,ZESTRIL      loratadine 10 MG tablet   Commonly known as: CLARITIN      naphazoline-pheniramine 0.025-0.3 % ophthalmic solution   Commonly known as: NAPHCON-A      polyethylene glycol packet   Commonly known as: MIRALAX / GLYCOLAX      spironolactone 25 MG tablet   Commonly known as: ALDACTONE           Outstanding Labs/Studies: None   Duration of Discharge Encounter: 40 minutes including physician time.  Signed, R. Hurman Horn,  PA-C 01/21/2011, 9:27 AM  I have seen, examined the patient, and reviewed the above assessment and plan.   Co Sign: Hillis Range, MD 01/24/2011 8:31 AM

## 2011-01-21 NOTE — Progress Notes (Signed)
SUBJECTIVE: The patient is doing well today s/p BIV.  At this time, she denies chest pain, shortness of breath, or any new concerns.     Marland Kitchen a stronger pump book   Does not apply Once  . acetaminophen  1,000 mg Oral BID  . acetaminophen  650 mg Oral Q6H  . carvedilol  12.5 mg Oral BID WC  . ceFAZolin (ANCEF) IV  1 g Intravenous Q6H  . docusate sodium  100 mg Oral BID  . fentaNYL      . fluticasone  1 spray Each Nare BID  . furosemide  20 mg Oral Daily  . imipramine  100 mg Oral QHS  . levothyroxine  125 mcg Oral QAC breakfast  . lidocaine      . lisinopril  2.5 mg Oral Daily  . loratadine  10 mg Oral Daily  . midazolam      . midazolam      . sodium chloride  3 mL Intravenous Q12H  . spironolactone  25 mg Oral Daily  . you have a pacemaker book   Does not apply Once  . DISCONTD: ceFAZolin (ANCEF) IV  1 g Intravenous To Cath  . DISCONTD: gentamicin irrigation  80 mg Irrigation To Cath      . DISCONTD: sodium chloride    . DISCONTD: sodium chloride      OBJECTIVE: Physical Exam: Filed Vitals:   01/20/11 1700 01/20/11 2100 01/21/11 0004 01/21/11 0526  BP: 111/51 118/59 100/50 105/52  Pulse: 79 82 73 69  Temp:  97.8 F (36.6 C) 97.5 F (36.4 C) 98 F (36.7 C)  TempSrc:  Oral Oral Oral  Resp: 19     Height:      Weight:    154 lb 1.6 oz (69.9 kg)  SpO2: 99% 98% 97% 100%    Intake/Output Summary (Last 24 hours) at 01/21/11 0753 Last data filed at 01/21/11 0007  Gross per 24 hour  Intake    990 ml  Output   1050 ml  Net    -60 ml    Telemetry reveals sinus rhythm with BiV pacing  GEN- The patient is well appearing, alert and oriented x 3 today.   Head- normocephalic, atraumatic Eyes-  Sclera clear, conjunctiva pink Ears- hearing intact Oropharynx- clear Neck- supple, no JVP Chest- incision without hematoma Lungs- Clear to ausculation bilaterally, normal work of breathing Heart- Regular rate and rhythm, no murmurs, rubs or gallops, PMI not laterally  displaced GI- soft, NT, ND, + BS Extremities- no clubbing, cyanosis, or edema   LABS: Basic Metabolic Panel: No results found for this basename: NA:2,K:2,CL:2,CO2:2,GLUCOSE:2,BUN:2,CREATININE:2,CALCIUM:2,MG:2,PHOS:2 in the last 72 hours Liver Function Tests: No results found for this basename: AST:2,ALT:2,ALKPHOS:2,BILITOT:2,PROT:2,ALBUMIN:2 in the last 72 hours No results found for this basename: LIPASE:2,AMYLASE:2 in the last 72 hours CBC: No results found for this basename: WBC:2,NEUTROABS:2,HGB:2,HCT:2,MCV:2,PLT:2 in the last 72 hours Cardiac Enzymes: No results found for this basename: CKTOTAL:3,CKMB:3,CKMBINDEX:3,TROPONINI:3 in the last 72 hours BNP: No components found with this basename: POCBNP:3 D-Dimer: No results found for this basename: DDIMER:2 in the last 72 hours Hemoglobin A1C: No results found for this basename: HGBA1C in the last 72 hours Fasting Lipid Panel: No results found for this basename: CHOL,HDL,LDLCALC,TRIG,CHOLHDL,LDLDIRECT in the last 72 hours Thyroid Function Tests: No results found for this basename: TSH,T4TOTAL,FREET3,T3FREE,THYROIDAB in the last 72 hours Anemia Panel: No results found for this basename: VITAMINB12,FOLATE,FERRITIN,TIBC,IRON,RETICCTPCT in the last 72 hours  RADIOLOGY: Dg Chest 2 View  01/21/2011  *RADIOLOGY REPORT*  Clinical Data: Defibrillator insertion.  CHEST - 2 VIEW  Comparison: 01/20/2011  Findings: Left pacer/defibrillator has been placed with leads in the right atrium, right ventricle and coronary sinus.  No pneumothorax.  Heart is upper limits normal in size.  Lungs are clear.  No effusions or acute bony abnormality.  IMPRESSION: AICD placement.  No pneumothorax.  Original Report Authenticated By: Cyndie Chime, M.D.   X-ray Chest Pa And Lateral  01/20/2011  *RADIOLOGY REPORT*  Clinical Data: Preprocedural radiograph.  AICD insertion. Cardiomyopathy.  CHEST - 2 VIEW  Comparison: 04/13/2010.  Findings: Cardiomegaly.  No  failure.  No effusion.  No airspace disease. Cholecystectomy clips are present in the right upper quadrant.  Harrington rods are present.  Dextroconvex scoliosis. Linear subsegmental atelectasis is present posterior to the left atrium on the lateral view.  IMPRESSION: Cardiomegaly without evidence of failure.  Original Report Authenticated By: Andreas Newport, M.D.    ASSESSMENT AND PLAN:  1. Chronic systolic dysfunction- doing well s/p BIV CXR reviewed Device interrogation reviewed in detail and is normal today  Routine wound care and follow-up with Dr Ladona Ridgel  Discharge to home today   Hillis Range, MD 01/21/2011 7:53 AM

## 2011-01-21 NOTE — Plan of Care (Signed)
Problem: Phase II Progression Outcomes Goal: Other Phase II Outcomes/Goals Outcome: Completed/Met Date Met:  01/21/11 Videos and handouts provided for CHF teaching and AICD teaching.  Discussed at length and questions answered.  Pt demonstrates good knowledge of when to call MD, daily wts, and how to care for self r/t CHF.

## 2011-01-31 ENCOUNTER — Encounter: Payer: Self-pay | Admitting: Internal Medicine

## 2011-01-31 ENCOUNTER — Ambulatory Visit (INDEPENDENT_AMBULATORY_CARE_PROVIDER_SITE_OTHER): Payer: BC Managed Care – PPO | Admitting: *Deleted

## 2011-01-31 DIAGNOSIS — I5022 Chronic systolic (congestive) heart failure: Secondary | ICD-10-CM

## 2011-01-31 DIAGNOSIS — I428 Other cardiomyopathies: Secondary | ICD-10-CM

## 2011-01-31 DIAGNOSIS — I420 Dilated cardiomyopathy: Secondary | ICD-10-CM

## 2011-01-31 LAB — ICD DEVICE OBSERVATION
AL AMPLITUDE: 1.1 mv
AL IMPEDENCE ICD: 532 Ohm
AL THRESHOLD: 0.5 V
ATRIAL PACING ICD: 0.06 pct
BAMS-0001: 170 {beats}/min
BATTERY VOLTAGE: 3.208 V
CHARGE TIME: 2.562 s
FVT: 0
HV IMPEDENCE: 73 Ohm
LV LEAD IMPEDENCE ICD: 589 Ohm
LV LEAD THRESHOLD: 1.75 V
PACEART VT: 0
RV LEAD AMPLITUDE: 20 mv
RV LEAD IMPEDENCE ICD: 836 Ohm
RV LEAD THRESHOLD: 1 V
TOT-0001: 1
TOT-0002: 0
TOT-0006: 20130110000000
TZAT-0001ATACH: 1
TZAT-0001ATACH: 2
TZAT-0001ATACH: 3
TZAT-0001FASTVT: 1
TZAT-0001SLOWVT: 1
TZAT-0002ATACH: NEGATIVE
TZAT-0002ATACH: NEGATIVE
TZAT-0002ATACH: NEGATIVE
TZAT-0002FASTVT: NEGATIVE
TZAT-0002SLOWVT: NEGATIVE
TZAT-0012ATACH: 150 ms
TZAT-0012ATACH: 150 ms
TZAT-0012ATACH: 150 ms
TZAT-0012FASTVT: 200 ms
TZAT-0012SLOWVT: 200 ms
TZAT-0018ATACH: NEGATIVE
TZAT-0018ATACH: NEGATIVE
TZAT-0018ATACH: NEGATIVE
TZAT-0018FASTVT: NEGATIVE
TZAT-0018SLOWVT: NEGATIVE
TZAT-0019ATACH: 6 V
TZAT-0019ATACH: 6 V
TZAT-0019ATACH: 6 V
TZAT-0019FASTVT: 8 V
TZAT-0019SLOWVT: 8 V
TZAT-0020ATACH: 1.5 ms
TZAT-0020ATACH: 1.5 ms
TZAT-0020ATACH: 1.5 ms
TZAT-0020FASTVT: 1.5 ms
TZAT-0020SLOWVT: 1.5 ms
TZON-0003ATACH: 350 ms
TZON-0003SLOWVT: 360 ms
TZON-0003VSLOWVT: 360 ms
TZON-0004SLOWVT: 16
TZON-0004VSLOWVT: 32
TZON-0005SLOWVT: 12
TZST-0001ATACH: 4
TZST-0001ATACH: 5
TZST-0001ATACH: 6
TZST-0001FASTVT: 2
TZST-0001FASTVT: 3
TZST-0001FASTVT: 4
TZST-0001FASTVT: 5
TZST-0001FASTVT: 6
TZST-0001SLOWVT: 2
TZST-0001SLOWVT: 3
TZST-0001SLOWVT: 4
TZST-0001SLOWVT: 5
TZST-0001SLOWVT: 6
TZST-0002ATACH: NEGATIVE
TZST-0002ATACH: NEGATIVE
TZST-0002ATACH: NEGATIVE
TZST-0002FASTVT: NEGATIVE
TZST-0002FASTVT: NEGATIVE
TZST-0002FASTVT: NEGATIVE
TZST-0002FASTVT: NEGATIVE
TZST-0002FASTVT: NEGATIVE
TZST-0002SLOWVT: NEGATIVE
TZST-0002SLOWVT: NEGATIVE
TZST-0002SLOWVT: NEGATIVE
TZST-0002SLOWVT: NEGATIVE
TZST-0002SLOWVT: NEGATIVE
VENTRICULAR PACING ICD: 99.94 pct
VF: 0

## 2011-01-31 NOTE — Progress Notes (Signed)
Wound check-ICD 

## 2011-02-28 ENCOUNTER — Encounter: Payer: Self-pay | Admitting: Physician Assistant

## 2011-02-28 ENCOUNTER — Ambulatory Visit (INDEPENDENT_AMBULATORY_CARE_PROVIDER_SITE_OTHER): Payer: BC Managed Care – PPO | Admitting: Physician Assistant

## 2011-02-28 DIAGNOSIS — Z95810 Presence of automatic (implantable) cardiac defibrillator: Secondary | ICD-10-CM | POA: Insufficient documentation

## 2011-02-28 DIAGNOSIS — I5022 Chronic systolic (congestive) heart failure: Secondary | ICD-10-CM

## 2011-02-28 MED ORDER — FUROSEMIDE 40 MG PO TABS: ORAL_TABLET | ORAL | Status: DC

## 2011-02-28 NOTE — Progress Notes (Signed)
HPI:  This is a very pleasant 56 year old white female patient who recently had IV implantable cardioverter defibrillator implanted on 01/20/11 for nonischemic cardiomyopathy ejection fraction 25%, left bundle branch block, and class III heart failure symptoms.  Since then the patient says she has not been doing well. She complains of dyspnea on exertion going up a flight of stairs and very little activity. She can ride the elliptical 11 minutes where she was riding it 30 minutes before. She said her weight is up about 5 pounds but does not feel like she has any extra edema and she's been eating a bit more. She denies chest pain, palpitations, dizziness, or presyncope.  Allergies  Allergen Reactions  . Codeine Other (See Comments)    Chest pain  . Fentanyl Other (See Comments)    Bad dreams  . Methocarbamol Other (See Comments)    Chest pain.  Marland Kitchen Morphine And Related     Current Outpatient Prescriptions on File Prior to Visit  Medication Sig Dispense Refill  . acetaminophen (TYLENOL) 500 MG tablet Take 1,000 mg by mouth 2 (two) times daily.       . carvedilol (COREG) 12.5 MG tablet Take 12.5 mg by mouth 2 (two) times daily with a meal.        . eletriptan (RELPAX) 40 MG tablet One tablet by mouth as needed for migraine headache.  If the headache improves and then returns, dose may be repeated after 2 hours have elapsed since first dose (do not exceed 80 mg per day). For migraine. may repeat in 2 hours if necessary       . fluticasone (FLONASE) 50 MCG/ACT nasal spray Place 1 spray into the nose 2 (two) times daily.        . furosemide (LASIX) 40 MG tablet Take 20 mg by mouth daily.       Marland Kitchen imipramine (TOFRANIL) 50 MG tablet Take 100 mg by mouth at bedtime. 2 twice daily      . levothyroxine (SYNTHROID, LEVOTHROID) 125 MCG tablet Take 125 mcg by mouth daily. 1 tablet daily      . lisinopril (PRINIVIL,ZESTRIL) 2.5 MG tablet Take 2.5 mg by mouth daily.        Marland Kitchen loratadine (CLARITIN) 10 MG tablet  Take 10 mg by mouth daily.        . naphazoline-pheniramine (NAPHCON-A) 0.025-0.3 % ophthalmic solution Place 1 drop into both eyes daily as needed. For dry eyes.       . polyethylene glycol (MIRALAX / GLYCOLAX) packet Take 17 g by mouth daily as needed. For constipation.       Marland Kitchen spironolactone (ALDACTONE) 25 MG tablet Take 25 mg by mouth daily. 1 tablet daily      . docusate sodium (COLACE) 100 MG capsule Take 100 mg by mouth 2 (two) times daily.          Past Medical History  Diagnosis Date  . Pleural effusion, right     thoracentesis 02/09/09   . CHF (congestive heart failure)     Dr. Donnie Aho  . Cardiomyopathy   . Hypertension     essential- benign  . Aortic valve disorder   . LBBB (left bundle branch block)     conduction disorder  . Migraines   . Osteoporosis   . Complication of anesthesia     "hallucinations from Fentanyl patch"  . PONV (postoperative nausea and vomiting)     "from getting Morphine"  . Complication of anesthesia     "  codeine gives me serious chest pains"  . Pneumonia   . Hypothyroidism   . SOB (shortness of breath) on exertion   . 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 Anxiety 01/20/11    "in the past; not now"  . Raynaud's disease   . Raynaud's disease   . Raynaud's disease     Past Surgical History  Procedure Date  . Vesicovaginal fistula closure w/ tah   . Thyroid lobectomy ~ 2006    right  . Rotator cuff repair ~ 2009    left  . Cardiac defibrillator placement 01/20/11  . Cholecystectomy ~1996  . Tubal ligation ~ 1983  . Vaginal hysterectomy ~ 2009  . Back surgery 12/31/2009    "for flat back syndrome; fused bottom of my back"  . Foot neuroma surgery ~ 2003    right  . Skin cancer excision     nose, forehead, left leg    Family History  Problem Relation Age of Onset  . Heart disease Mother   . Heart disease Sister     History   Social History  . Marital Status: Married    Spouse Name: N/A    Number  of Children: N/A  . Years of Education: N/A   Occupational History  . Teacher    Social History Main Topics  . Smoking status: Never Smoker   . Smokeless tobacco: Never Used  . Alcohol Use: No  . Drug Use: No  . Sexually Active: Yes   Other Topics Concern  . Not on file   Social History Narrative  . No narrative on file    ROS: See history of present illness  PHYSICAL EXAM: Well-nournished, in no acute distress. Neck: No JVD, HJR, Bruit, or thyroid enlargement Lungs: No tachypnea, clear without wheezing, rales, or rhonchi Cardiovascular: RRR, PMI not displaced, heart sounds normal, no murmurs, gallops, bruit, thrill, or heave. Abdomen: BS normal. Soft without organomegaly, masses, lesions or tenderness. Extremities: without cyanosis, clubbing or edema. Good distal pulses bilateral SKin: Warm, no lesions or rashes  Musculoskeletal: No deformities Neuro: no focal signs  BP 94/64  Pulse 76  Ht 5' 7.5" (1.715 m)  Wt 159 lb (72.122 kg)  BMI 24.54 kg/m2

## 2011-02-28 NOTE — Patient Instructions (Signed)
Your physician recommends that you schedule a follow-up appointment in: March with Dr Ladona Ridgel Your physician has recommended you make the following change in your medication: INCREASE Furosemide to 40 mg daily for 1 week then reduce down to 20 mg daily

## 2011-02-28 NOTE — Assessment & Plan Note (Signed)
Patient complains of dyspnea on exertion ever since her biventricular defibrillator. I don't see any evidence of heart failure on exam but her weight is up approximately 5 pounds. Her defibrillator was checked once again and adjusted.

## 2011-02-28 NOTE — Assessment & Plan Note (Signed)
Patient complains of increased dyspnea on exertion and not feeling better since her defibrillator insertion. We will increase her diuretics today. Defibrillator was adjusted. She will see Dr. Ladona Ridgel next month.

## 2011-03-29 ENCOUNTER — Encounter: Payer: BC Managed Care – PPO | Admitting: Internal Medicine

## 2011-04-04 ENCOUNTER — Other Ambulatory Visit: Payer: Self-pay | Admitting: Cardiology

## 2011-04-26 ENCOUNTER — Encounter: Payer: Self-pay | Admitting: Internal Medicine

## 2011-04-26 ENCOUNTER — Ambulatory Visit (INDEPENDENT_AMBULATORY_CARE_PROVIDER_SITE_OTHER): Payer: BC Managed Care – PPO | Admitting: Internal Medicine

## 2011-04-26 VITALS — BP 110/70 | HR 75 | Ht 67.5 in | Wt 156.8 lb

## 2011-04-26 DIAGNOSIS — I5022 Chronic systolic (congestive) heart failure: Secondary | ICD-10-CM

## 2011-04-26 DIAGNOSIS — Z95810 Presence of automatic (implantable) cardiac defibrillator: Secondary | ICD-10-CM

## 2011-04-26 DIAGNOSIS — I428 Other cardiomyopathies: Secondary | ICD-10-CM

## 2011-04-26 DIAGNOSIS — I4891 Unspecified atrial fibrillation: Secondary | ICD-10-CM

## 2011-04-26 LAB — ICD DEVICE OBSERVATION
AL AMPLITUDE: 2.3 mv
AL IMPEDENCE ICD: 532 Ohm
AL THRESHOLD: 0.5 V
BAMS-0001: 170 {beats}/min
BATTERY VOLTAGE: 3.2 V
CHARGE TIME: 8.4 s
HV IMPEDENCE: 81 Ohm
LV LEAD IMPEDENCE ICD: 551 Ohm
LV LEAD THRESHOLD: 1 V
RV LEAD AMPLITUDE: 20 mv
RV LEAD IMPEDENCE ICD: 893 Ohm
RV LEAD THRESHOLD: 0.75 V
TZAT-0001ATACH: 1
TZAT-0001ATACH: 2
TZAT-0001ATACH: 3
TZAT-0001FASTVT: 1
TZAT-0001SLOWVT: 1
TZAT-0002ATACH: NEGATIVE
TZAT-0002ATACH: NEGATIVE
TZAT-0002ATACH: NEGATIVE
TZAT-0002FASTVT: NEGATIVE
TZAT-0002SLOWVT: NEGATIVE
TZAT-0012ATACH: 150 ms
TZAT-0012ATACH: 150 ms
TZAT-0012ATACH: 150 ms
TZAT-0012FASTVT: 200 ms
TZAT-0012SLOWVT: 200 ms
TZAT-0018ATACH: NEGATIVE
TZAT-0018ATACH: NEGATIVE
TZAT-0018ATACH: NEGATIVE
TZAT-0018FASTVT: NEGATIVE
TZAT-0018SLOWVT: NEGATIVE
TZAT-0019ATACH: 6 V
TZAT-0019ATACH: 6 V
TZAT-0019ATACH: 6 V
TZAT-0019FASTVT: 8 V
TZAT-0019SLOWVT: 8 V
TZAT-0020ATACH: 1.5 ms
TZAT-0020ATACH: 1.5 ms
TZAT-0020ATACH: 1.5 ms
TZAT-0020FASTVT: 1.5 ms
TZAT-0020SLOWVT: 1.5 ms
TZON-0003ATACH: 350 ms
TZON-0003SLOWVT: 360 ms
TZON-0003VSLOWVT: 360 ms
TZON-0004SLOWVT: 16
TZON-0004VSLOWVT: 32
TZON-0005SLOWVT: 12
TZST-0001ATACH: 4
TZST-0001ATACH: 5
TZST-0001ATACH: 6
TZST-0001FASTVT: 2
TZST-0001FASTVT: 3
TZST-0001FASTVT: 4
TZST-0001FASTVT: 5
TZST-0001FASTVT: 6
TZST-0001SLOWVT: 2
TZST-0001SLOWVT: 3
TZST-0001SLOWVT: 4
TZST-0001SLOWVT: 5
TZST-0001SLOWVT: 6
TZST-0002ATACH: NEGATIVE
TZST-0002ATACH: NEGATIVE
TZST-0002ATACH: NEGATIVE
TZST-0002FASTVT: NEGATIVE
TZST-0002FASTVT: NEGATIVE
TZST-0002FASTVT: NEGATIVE
TZST-0002FASTVT: NEGATIVE
TZST-0002FASTVT: NEGATIVE
TZST-0002SLOWVT: NEGATIVE
TZST-0002SLOWVT: NEGATIVE
TZST-0002SLOWVT: NEGATIVE
TZST-0002SLOWVT: NEGATIVE
TZST-0002SLOWVT: NEGATIVE
VENTRICULAR PACING ICD: 99.9 pct

## 2011-04-26 NOTE — Assessment & Plan Note (Signed)
Her device is working normally. We'll plan to recheck in several months. She will follow up with Dr. Donnie Aho for this.

## 2011-04-26 NOTE — Patient Instructions (Signed)
Your physician wants you to follow-up in: 9 MONTHS You will receive a reminder letter in the mail two months in advance. If you don't receive a letter, please call our office to schedule the follow-up appointment.  

## 2011-04-26 NOTE — Progress Notes (Signed)
HPI [name] returns today for followup. She is a pleasant 56 yo woman with a history of nonischemic cardiomyopathy and chronic systolic heart failure and left bundle branch block. She underwent biventricular ICD implantation several months ago and returns today for followup. In the interim, she has done well. She denies chest pain, and her congestive heart failure symptoms are now class 1-2. She notes that she walks quickly upstairs she still gets winded. On flat ground, she can walk without limitation except for back discomfort. She denies syncope or recent ICD shock. She denies peripheral edema. Allergies  Allergen Reactions  . Codeine Other (See Comments)    Chest pain  . Fentanyl Other (See Comments)    Bad dreams  . Methocarbamol Other (See Comments)    Chest pain.  Marland Kitchen Morphine And Related      Current Outpatient Prescriptions  Medication Sig Dispense Refill  . acetaminophen (TYLENOL) 500 MG tablet Take 1,000 mg by mouth 2 (two) times daily.       . carvedilol (COREG) 12.5 MG tablet Take 12.5 mg by mouth 2 (two) times daily with a meal.        . docusate sodium (COLACE) 100 MG capsule Take 100 mg by mouth 2 (two) times daily.        Marland Kitchen eletriptan (RELPAX) 40 MG tablet One tablet by mouth as needed for migraine headache.  If the headache improves and then returns, dose may be repeated after 2 hours have elapsed since first dose (do not exceed 80 mg per day). For migraine. may repeat in 2 hours if necessary       . fluticasone (FLONASE) 50 MCG/ACT nasal spray Place 1 spray into the nose 2 (two) times daily.        . furosemide (LASIX) 40 MG tablet Take 1 tabs daily for 1 week then decrease to 1/2 tab daily as directed  30 tablet  6  . imipramine (TOFRANIL) 50 MG tablet Take 100 mg by mouth at bedtime.       Marland Kitchen levothyroxine (SYNTHROID, LEVOTHROID) 125 MCG tablet Take 125 mcg by mouth daily. 1 tablet daily      . lisinopril (PRINIVIL,ZESTRIL) 2.5 MG tablet Take 2.5 mg by mouth daily.         Marland Kitchen loratadine (CLARITIN) 10 MG tablet Take 10 mg by mouth daily.        . naphazoline-pheniramine (NAPHCON-A) 0.025-0.3 % ophthalmic solution Place 1 drop into both eyes daily as needed. For dry eyes.       . phenylephrine (SUDAFED PE) 10 MG TABS Take 10 mg by mouth every 4 (four) hours as needed.      . polyethylene glycol (MIRALAX / GLYCOLAX) packet Take 17 g by mouth daily as needed. For constipation.       Marland Kitchen spironolactone (ALDACTONE) 25 MG tablet Take 25 mg by mouth daily. 1 tablet daily         Past Medical History  Diagnosis Date  . Pleural effusion, right     thoracentesis 02/09/09   . CHF (congestive heart failure)     Dr. Donnie Aho  . Cardiomyopathy   . Hypertension     essential- benign  . Aortic valve disorder   . LBBB (left bundle branch block)     conduction disorder  . Migraines   . Osteoporosis   . Complication of anesthesia     "hallucinations from Fentanyl patch"  . PONV (postoperative nausea and vomiting)     "from getting  Morphine"  . Complication of anesthesia     "codeine gives me serious chest pains"  . Pneumonia   . Hypothyroidism   . SOB (shortness of breath) on exertion   . 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 Anxiety 01/20/11    "in the past; not now"  . Raynaud's disease   . Fibroids   . Scoliosis   . Neuroma     Bladder  . AF (paroxysmal atrial fibrillation)   . H/O: hysterectomy     secondary to uterine prolapse    ROS:   All systems reviewed and negative except as noted in the HPI.   Past Surgical History  Procedure Date  . Vesicovaginal fistula closure w/ tah   . Thyroid lobectomy ~ 2006    right  . Rotator cuff repair ~ 2009    left  . Cardiac defibrillator placement 01/20/11  . Cholecystectomy ~1996  . Tubal ligation ~ 1983  . Vaginal hysterectomy ~ 2009  . Back surgery 12/31/2009    "for flat back syndrome; fused bottom of my back"  . Foot neuroma surgery ~ 2003    right  . Skin cancer excision      nose, forehead, left leg  . Scoliosis surgery      Family History  Problem Relation Age of Onset  . Heart disease Mother   . Heart disease Sister   . Heart disease Father 3  . Macular degeneration Father   . Heart failure Mother 67    CHF  . Stroke Mother   . Hypertension Mother   . Atrial fibrillation Sister   . Heart disease Sister      History   Social History  . Marital Status: Married    Spouse Name: N/A    Number of Children: N/A  . Years of Education: N/A   Occupational History  . Teacher    Social History Main Topics  . Smoking status: Never Smoker   . Smokeless tobacco: Never Used  . Alcohol Use: No  . Drug Use: No  . Sexually Active: Yes   Other Topics Concern  . Not on file   Social History Narrative  . No narrative on file     BP 110/70  Pulse 75  Ht 5' 7.5" (1.715 m)  Wt 71.124 kg (156 lb 12.8 oz)  BMI 24.20 kg/m2  SpO2 100%  Physical Exam:  Well appearing middle-aged woman, NAD HEENT: Unremarkable Neck:  No JVD, no thyromegally Lymphatics:  No adenopathy Back:  No CVA tenderness Lungs:  Clear with no wheezes, rales, or rhonchi. Well-healed ICD incision. HEART:  Regular rate rhythm, no murmurs, no rubs, no clicks Abd:  soft, positive bowel sounds, no organomegally, no rebound, no guarding Ext:  2 plus pulses, no edema, no cyanosis, no clubbing Skin:  No rashes no nodules Neuro:  CN II through XII intact, motor grossly intact   DEVICE  Normal device function.  See PaceArt for details.   Assess/Plan:

## 2011-04-26 NOTE — Assessment & Plan Note (Signed)
Her symptoms are currently well controlled. She will continue her medical therapy and maintain a low-sodium diet.

## 2011-05-02 ENCOUNTER — Other Ambulatory Visit: Payer: Self-pay | Admitting: Cardiology

## 2011-07-15 ENCOUNTER — Other Ambulatory Visit: Payer: Self-pay | Admitting: Orthopedic Surgery

## 2011-07-15 DIAGNOSIS — M25511 Pain in right shoulder: Secondary | ICD-10-CM

## 2011-07-20 ENCOUNTER — Ambulatory Visit
Admission: RE | Admit: 2011-07-20 | Discharge: 2011-07-20 | Disposition: A | Discharge status: Home / Self Care | Payer: BC Managed Care – PPO | Source: Ambulatory Visit | Attending: Orthopedic Surgery | Admitting: Orthopedic Surgery

## 2011-07-20 DIAGNOSIS — M25511 Pain in right shoulder: Secondary | ICD-10-CM

## 2011-07-20 MED ORDER — IOHEXOL 180 MG/ML  SOLN
20.0000 mL | Freq: Once | INTRAMUSCULAR | Status: AC | PRN
  Administered 2011-07-20: 20 mL via INTRA_ARTICULAR

## 2011-08-04 ENCOUNTER — Other Ambulatory Visit (HOSPITAL_COMMUNITY): Payer: Self-pay | Admitting: Orthopedic Surgery

## 2011-09-06 ENCOUNTER — Encounter (HOSPITAL_COMMUNITY): Payer: Self-pay | Admitting: Respiratory Therapy

## 2011-09-13 NOTE — Pre-Procedure Instructions (Signed)
[address] Arlyne F Fuster  09/13/2011   Your procedure is scheduled on:  Tues, Sept 10 @ 7:30 AM  Report to [facility] at 5:30 AM.  Call this number if you have problems the morning of surgery: [phone]   Remember:   Do not eat food:After Midnight.  Take these medicines the morning of surgery with A SIP OF WATER: Carvedilol(Coreg),Allegra(Fexofenadine),Fluticasone(Flonase),and  Levothyroxine(Synthroid)   Do not wear jewelry, make-up or nail polish.  Do not wear lotions, powders, or perfumes.  Do not shave 48 hours prior to surgery.  Do not bring valuables to the hospital.  Contacts, dentures or bridgework may not be worn into surgery.  Leave suitcase in the car. After surgery it may be brought to your room.  For patients admitted to the hospital, checkout time is 11:00 AM the day of discharge.   Patients discharged the day of surgery will not be allowed to drive home.    Special Instructions: CHG Shower Use Special Wash: 1/2 bottle night before surgery and 1/2 bottle morning of surgery.   Please read over the following fact sheets that you were given: Pain Booklet, Coughing and Deep Breathing, MRSA Information and Surgical Site Infection Prevention

## 2011-09-13 NOTE — Consult Note (Addendum)
Anesthesia Chart Review:  Patient is a 56 year old female scheduled for right shoulder arthroscopy with subacromial decompression, mini-open rotator cuff tear repair on 09/20/11 by Dr. August Saucer.      History includes non-ischemic cardiomyopathy, chronic systolic CHF, s/p [employer] biventricular ICD placement on 01/20/11, chronic DOE, paroxysmal afib, known left BBB, mild AR by 07/2011 echo, HTN, Raynaud's disease, PNA/right pleural effusion 02/2010, migraines, partial thyroidectomy, hypothyroidism, foot neuroma surgery, fibroids, osteoporosis, scoliosis s/p multiple surgeries, hysterectomy, skin cancer (SCC) involving the nose, left leg, and forehead.  Non-smoker.  PCP is Dr. Yehuda Budd.   For "anesthesia" complications, she lists post-operative nausea/vomiting and requests her anesthesia team be proactive with anti-emetic use in hopes to limit this.  She also reports "hallucinations" and nightmares with fentanyl and "chest pains" with codeine and methocarbamol.  She also has significant N/V with Morphine, but said she tolerated Dilaudid po which was prescribed for post-operative pain in March 2011.  She underwent left rotator cuff repair on 04/01/09 by Dr. Darrelyn Hillock, and reported that she did well with the "anesthetics" used for that procedure and hopes similar agents can be used for this procedure.  (Records are on her chart for review by her Anesthesiologist on the day of surgery.)  Peripheral IVs in her hands have exacerbated her Raynauds in the past, so she would prefer a different site.  Of note, she had a prolonged hospitalization at [facility] in 12/2009 for likely aspiration PNA/ARDS following surgery (Harrington Rods) for severe scoliosis.  She required re-intubation and required ventilator assistance X 3 weeks.  She has been evaluated by Pulmonologist Dr. Sherene Sires in the past, last visit was in April 2012 and PRN follow-up was recommended.  (See notes in Epic.)  Dr. Donnie Aho is her primary Cardiologist, last visit was on  07/19/11.  He recommended a repeat echo which was done on 08/03/11 (see below).  Her EP Cardiologist is Dr. Ladona Ridgel, last visit was on 04/26/11.  She denies any new CV/CHF symptoms.  She is able to do 2 miles/day on her Elliptical machine.  Exam shows her heart with a RRR, lungs clear, no peripheral edema.    EKGs on 01/21/11 (Epic) and 04/19/11 (Dr. York Spaniel office) both show a v-paced rhythm, anterior T wave abnormality/inversion.  Echo on 08/03/11 showed moderately reduced LV function but with improvement since previous echo in March, EF 35-40%, mild AR.  Cardiac cath (Dr. Donnie Aho) on 02/11/10 showed: 1. Normal coronary arteries with no coronary calcification or irregularities.  2. Cardiomyopathy with ejection fraction of 25-30% with global hypokinesis.   2V CXR findings on 01/21/11 showed: Left pacer/defibrillator has been placed with leads in the right atrium, right ventricle and coronary sinus. No pneumothorax. Heart is upper limits normal in size. Lungs are clear. No effusions or acute bony abnormality.   Labs noted.  Her ICD Rx is still pending from [facility].  She has had recent (within the past two months) Cardiology follow-up with Dr. Donnie Aho.  She does not exhibit or report any acute CV/CHF symptoms.  She had normal coronaries by cath in 2012 and is now s/p AICD.  EF was improved by echo last month.  She has not had any ICD shocks.  Her Anesthesiologist will review her anesthesia history and discuss the definitive anesthesia plan on the day of surgery.  If not significant change in her status, then anticipate she can proceed as planned.  Shonna Chock, PA-C

## 2011-09-14 ENCOUNTER — Encounter (HOSPITAL_COMMUNITY)
Admission: RE | Admit: 2011-09-14 | Discharge: 2011-09-14 | Disposition: A | Discharge status: Home / Self Care | Payer: BC Managed Care – PPO | Source: Ambulatory Visit | Attending: Orthopedic Surgery | Admitting: Orthopedic Surgery

## 2011-09-14 ENCOUNTER — Encounter (HOSPITAL_COMMUNITY): Payer: Self-pay

## 2011-09-14 HISTORY — DX: Gastro-esophageal reflux disease without esophagitis: K21.9

## 2011-09-14 HISTORY — DX: Thyrotoxicosis, unspecified without thyrotoxic crisis or storm: E05.90

## 2011-09-14 LAB — BASIC METABOLIC PANEL
BUN: 26 mg/dL — ABNORMAL HIGH (ref 6–23)
CO2: 31 mEq/L (ref 19–32)
Calcium: 9.7 mg/dL (ref 8.4–10.5)
Chloride: 101 mEq/L (ref 96–112)
Creatinine, Ser: 1.22 mg/dL — ABNORMAL HIGH (ref 0.50–1.10)
GFR calc Af Amer: 56 mL/min — ABNORMAL LOW (ref >90)
GFR calc non Af Amer: 48 mL/min — ABNORMAL LOW (ref >90)
Glucose, Bld: 98 mg/dL (ref 70–99)
Potassium: 4.3 mEq/L (ref 3.5–5.1)
Sodium: 138 mEq/L (ref 135–145)

## 2011-09-14 LAB — CBC
HCT: 38.9 % (ref 36.0–46.0)
Hemoglobin: 13.1 g/dL (ref 12.0–15.0)
MCH: 30.2 pg (ref 26.0–34.0)
MCHC: 33.7 g/dL (ref 30.0–36.0)
MCV: 89.6 fL (ref 78.0–100.0)
Platelets: 293 10*3/uL (ref 150–400)
RBC: 4.34 MIL/uL (ref 3.87–5.11)
RDW: 12.3 % (ref 11.5–15.5)
WBC: 7.6 10*3/uL (ref 4.0–10.5)

## 2011-09-14 LAB — SURGICAL PCR SCREEN
MRSA, PCR: NEGATIVE
Staphylococcus aureus: NEGATIVE

## 2011-09-14 NOTE — Progress Notes (Signed)
Peri-operative Device Orders faxed to [facility].   [employer] Rep.  Marceia  Called and given surgery date and time.

## 2011-09-19 MED ORDER — CEFAZOLIN SODIUM-DEXTROSE 2-3 GM-% IV SOLR
2.0000 g | INTRAVENOUS | Status: AC
  Administered 2011-09-20: 2 g via INTRAVENOUS
  Filled 2011-09-19: qty 50

## 2011-09-19 NOTE — H&P (Signed)
[name] is an 56 y.o. female.   Chief Complaint: Right shoulder pain HPI: [name] is a 56 year old female with complicated past cardiac history who presents with long history of right shoulder pain. The pain has been refractory to nonoperative management. Interferes with her activities of daily living and is to rest pain at night pain. Imaging studies confirmed rotator cuff tear on the right-hand side. Patient's failed conservative measures including therapy injection activity modification and medication.  Past Medical History  Diagnosis Date  . Pleural effusion, right     thoracentesis 02/09/09   . CHF (congestive heart failure)     Dr. Donnie Aho  . Cardiomyopathy   . Hypertension     essential- benign  . Aortic valve disorder   . LBBB (left bundle branch block)     conduction disorder  . Migraines   . Osteoporosis   . Hypothyroidism   . SOB (shortness of breath) on exertion   . Anxiety 01/20/11    "in the past; not now"  . Raynaud's disease   . Fibroids   . Scoliosis   . Neuroma     Bladder  . AF (paroxysmal atrial fibrillation)   . H/O: hysterectomy     secondary to uterine prolapse  . Complication of anesthesia     "hallucinations from Fentanyl patch"  . PONV (postoperative nausea and vomiting)     "from getting Morphine"  . Complication of anesthesia     "codeine gives me serious chest pains"  . Complication of anesthesia     NO IV IN HANDS RAYNARD'S  . Hyperthyroidism   . ICD (implantable cardiac defibrillator) in place   . Pneumonia   . GERD (gastroesophageal reflux disease)     otc  . 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 Vesicovaginal fistula closure w/ tah   . Thyroid lobectomy ~ 2006    right  . Rotator cuff repair ~ 2009    left  . Cardiac defibrillator placement 01/20/11  . Cholecystectomy ~1996  . Tubal ligation ~ 1983  . Vaginal hysterectomy ~ 2009  . Back surgery  12/31/2009    "for flat back syndrome; fused bottom of my back"  . Foot neuroma surgery ~ 2003    right  . Skin cancer excision     nose, forehead, left leg  . Scoliosis surgery     Family History  Problem Relation Age of Onset  . Heart disease Mother   . Heart disease Sister   . Heart disease Father 13  . Macular degeneration Father   . Heart failure Mother 76    CHF  . Stroke Mother   . Hypertension Mother   . Atrial fibrillation Sister   . Heart disease Sister    Social History:  reports that she has never smoked. She has never used smokeless tobacco. She reports that she does not drink alcohol or use illicit drugs.  Allergies:  Allergies  Allergen Reactions  . Codeine Other (See Comments)    Chest pain  . Fentanyl Other (See Comments)    Bad dreams  . Methocarbamol Other (See Comments)    Chest pain.  Marland Kitchen Morphine And Related     No prescriptions prior to admission    No results found for this or any previous visit (from the past 48 hour(s)). No results found.  Review of Systems  Constitutional: Negative.  HENT: Negative.   Eyes: Negative.   Respiratory: Negative.   Cardiovascular: Negative.   Gastrointestinal: Negative.   Genitourinary: Negative.   Musculoskeletal: Positive for joint pain.  Skin: Negative.   Neurological: Negative.   Endo/Heme/Allergies: Negative.   Psychiatric/Behavioral: Negative.     There were no vitals taken for this visit. Physical Exam  Constitutional: She appears well-developed.  HENT:  Head: Normocephalic.  Eyes: Pupils are equal, round, and reactive to light.  Neck: Normal range of motion.  Cardiovascular: Normal rate.   Respiratory: Effort normal.  GI: Soft.  Neurological: She is alert.  Skin: Skin is warm.   examination of the right shoulder demonstrates full active and passive range of motion but with positive impingement signs no a.c. joint tenderness to direct palpation slight weakness dissected supraspinatus  muscle testing subscap supraspinatus testing is intact and comparable to the left-hand side. Motor sensory function is intact in the right arm.  Assessment/Plan Impression is right shoulder rotator cuff tear the patient has significant cardiac comorbidity due to prior decrease in heart function due to medication. Patient is currently using a pacemaker. She is been seen by cardiologist preoperatively and will be followed postoperatively by cardiology. Patient understands the risk and benefits of surgical intervention including but not limited to infection nerve vessel damage shoulder stiffness incomplete pain relief and potential need for more surgery. All questions are answered. Patient will proceed with surgery tomorrow. This clinic note is based upon clinic visit to [facility] orthopedics several months ago.  DEAN,GREGORY SCOTT 09/19/2011, 8:58 PM

## 2011-09-20 ENCOUNTER — Inpatient Hospital Stay (HOSPITAL_COMMUNITY)
Admission: RE | Admit: 2011-09-20 | Discharge: 2011-09-22 | DRG: 226 | Disposition: A | Discharge status: Home Health | Payer: BC Managed Care – PPO | Source: Ambulatory Visit | Attending: Orthopedic Surgery | Admitting: Orthopedic Surgery

## 2011-09-20 ENCOUNTER — Encounter (HOSPITAL_COMMUNITY)
Admission: RE | Disposition: A | Discharge status: Home Health | Payer: Self-pay | Source: Ambulatory Visit | Attending: Orthopedic Surgery

## 2011-09-20 ENCOUNTER — Ambulatory Visit (HOSPITAL_COMMUNITY): Payer: BC Managed Care – PPO | Admitting: Vascular Surgery

## 2011-09-20 ENCOUNTER — Encounter (HOSPITAL_COMMUNITY): Payer: Self-pay | Admitting: Vascular Surgery

## 2011-09-20 ENCOUNTER — Encounter (HOSPITAL_COMMUNITY): Payer: Self-pay | Admitting: *Deleted

## 2011-09-20 DIAGNOSIS — X58XXXA Exposure to other specified factors, initial encounter: Secondary | ICD-10-CM | POA: Diagnosis present

## 2011-09-20 DIAGNOSIS — I447 Left bundle-branch block, unspecified: Secondary | ICD-10-CM | POA: Diagnosis present

## 2011-09-20 DIAGNOSIS — I7300 Raynaud's syndrome without gangrene: Secondary | ICD-10-CM | POA: Diagnosis present

## 2011-09-20 DIAGNOSIS — Z85828 Personal history of other malignant neoplasm of skin: Secondary | ICD-10-CM

## 2011-09-20 DIAGNOSIS — E039 Hypothyroidism, unspecified: Secondary | ICD-10-CM | POA: Diagnosis present

## 2011-09-20 DIAGNOSIS — K219 Gastro-esophageal reflux disease without esophagitis: Secondary | ICD-10-CM | POA: Diagnosis present

## 2011-09-20 DIAGNOSIS — I428 Other cardiomyopathies: Secondary | ICD-10-CM | POA: Diagnosis present

## 2011-09-20 DIAGNOSIS — M4120 Other idiopathic scoliosis, site unspecified: Secondary | ICD-10-CM | POA: Diagnosis present

## 2011-09-20 DIAGNOSIS — S43439A Superior glenoid labrum lesion of unspecified shoulder, initial encounter: Secondary | ICD-10-CM | POA: Diagnosis present

## 2011-09-20 DIAGNOSIS — Z95810 Presence of automatic (implantable) cardiac defibrillator: Secondary | ICD-10-CM | POA: Diagnosis present

## 2011-09-20 DIAGNOSIS — M67919 Unspecified disorder of synovium and tendon, unspecified shoulder: Principal | ICD-10-CM | POA: Diagnosis present

## 2011-09-20 DIAGNOSIS — M810 Age-related osteoporosis without current pathological fracture: Secondary | ICD-10-CM | POA: Diagnosis present

## 2011-09-20 DIAGNOSIS — I351 Nonrheumatic aortic (valve) insufficiency: Secondary | ICD-10-CM | POA: Insufficient documentation

## 2011-09-20 DIAGNOSIS — I509 Heart failure, unspecified: Secondary | ICD-10-CM | POA: Diagnosis present

## 2011-09-20 DIAGNOSIS — Z950 Presence of cardiac pacemaker: Secondary | ICD-10-CM

## 2011-09-20 DIAGNOSIS — M719 Bursopathy, unspecified: Principal | ICD-10-CM | POA: Diagnosis present

## 2011-09-20 DIAGNOSIS — M75100 Unspecified rotator cuff tear or rupture of unspecified shoulder, not specified as traumatic: Secondary | ICD-10-CM

## 2011-09-20 HISTORY — DX: Unspecified osteoarthritis, unspecified site: M19.90

## 2011-09-20 SURGERY — SHOULDER ARTHROSCOPY WITH ROTATOR CUFF REPAIR AND SUBACROMIAL DECOMPRESSION
Anesthesia: General | Site: Shoulder | Laterality: Right | Wound class: Clean

## 2011-09-20 MED ORDER — FUROSEMIDE 20 MG PO TABS
20.0000 mg | ORAL_TABLET | Freq: Every day | ORAL | Status: DC
  Administered 2011-09-22: 20 mg via ORAL
  Filled 2011-09-20 (×2): qty 1

## 2011-09-20 MED ORDER — MIDAZOLAM HCL 5 MG/5ML IJ SOLN
INTRAMUSCULAR | Status: DC | PRN
  Administered 2011-09-20 (×2): 1 mg via INTRAVENOUS

## 2011-09-20 MED ORDER — ONDANSETRON HCL 4 MG/2ML IJ SOLN
4.0000 mg | Freq: Four times a day (QID) | INTRAMUSCULAR | Status: DC | PRN
  Administered 2011-09-20 – 2011-09-21 (×2): 4 mg via INTRAVENOUS
  Filled 2011-09-20 (×3): qty 2

## 2011-09-20 MED ORDER — POLYETHYLENE GLYCOL 3350 17 G PO PACK: 17.0000 g | PACK | Freq: Every day | ORAL | Status: DC | PRN

## 2011-09-20 MED ORDER — ETOMIDATE 2 MG/ML IV SOLN
INTRAVENOUS | Status: DC | PRN
  Administered 2011-09-20: 12 mg via INTRAVENOUS

## 2011-09-20 MED ORDER — METOCLOPRAMIDE HCL 10 MG PO TABS: 5.0000 mg | ORAL_TABLET | Freq: Three times a day (TID) | ORAL | Status: DC | PRN

## 2011-09-20 MED ORDER — ONDANSETRON HCL 4 MG PO TABS: 4.0000 mg | ORAL_TABLET | Freq: Four times a day (QID) | ORAL | Status: DC | PRN

## 2011-09-20 MED ORDER — PROPOFOL INFUSION 10 MG/ML OPTIME
INTRAVENOUS | Status: DC | PRN
  Administered 2011-09-20: 50 ug/kg/min via INTRAVENOUS

## 2011-09-20 MED ORDER — ACETAMINOPHEN 10 MG/ML IV SOLN
INTRAVENOUS | Status: AC
  Filled 2011-09-20: qty 100

## 2011-09-20 MED ORDER — SPIRONOLACTONE 25 MG PO TABS
25.0000 mg | ORAL_TABLET | Freq: Every day | ORAL | Status: DC
  Administered 2011-09-22: 25 mg via ORAL
  Filled 2011-09-20 (×2): qty 1

## 2011-09-20 MED ORDER — NEOSTIGMINE METHYLSULFATE 1 MG/ML IJ SOLN
INTRAMUSCULAR | Status: DC | PRN
  Administered 2011-09-20: 3 mg via INTRAVENOUS

## 2011-09-20 MED ORDER — CHLORHEXIDINE GLUCONATE 4 % EX LIQD: 60.0000 mL | Freq: Once | CUTANEOUS | Status: DC

## 2011-09-20 MED ORDER — LEVOTHYROXINE SODIUM 125 MCG PO TABS
125.0000 ug | ORAL_TABLET | Freq: Every day | ORAL | Status: DC
  Administered 2011-09-21 – 2011-09-22 (×2): 125 ug via ORAL
  Filled 2011-09-20 (×4): qty 1

## 2011-09-20 MED ORDER — FLUTICASONE PROPIONATE 50 MCG/ACT NA SUSP
1.0000 | Freq: Two times a day (BID) | NASAL | Status: DC
  Administered 2011-09-20 – 2011-09-22 (×4): 1 via NASAL
  Filled 2011-09-20: qty 16

## 2011-09-20 MED ORDER — PHENOL 1.4 % MT LIQD: 1.0000 | OROMUCOSAL | Status: DC | PRN

## 2011-09-20 MED ORDER — HYDROMORPHONE HCL PF 1 MG/ML IJ SOLN
INTRAMUSCULAR | Status: AC
  Filled 2011-09-20: qty 1

## 2011-09-20 MED ORDER — SODIUM CHLORIDE 0.9 % IJ SOLN
INTRAMUSCULAR | Status: DC | PRN
  Administered 2011-09-20: 30 mL

## 2011-09-20 MED ORDER — LACTATED RINGERS IV SOLN
INTRAVENOUS | Status: DC | PRN
  Administered 2011-09-20 (×2): via INTRAVENOUS

## 2011-09-20 MED ORDER — IMIPRAMINE HCL 50 MG PO TABS
100.0000 mg | ORAL_TABLET | Freq: Every day | ORAL | Status: DC
  Administered 2011-09-20 – 2011-09-21 (×2): 100 mg via ORAL
  Filled 2011-09-20 (×3): qty 2

## 2011-09-20 MED ORDER — HYDROMORPHONE HCL PF 1 MG/ML IJ SOLN
0.2500 mg | INTRAMUSCULAR | Status: DC | PRN
  Administered 2011-09-20: 0.25 mg via INTRAVENOUS
  Administered 2011-09-20: 0.5 mg via INTRAVENOUS
  Administered 2011-09-20: 0.25 mg via INTRAVENOUS

## 2011-09-20 MED ORDER — ONDANSETRON HCL 4 MG/2ML IJ SOLN
INTRAMUSCULAR | Status: DC | PRN
  Administered 2011-09-20: 4 mg via INTRAVENOUS

## 2011-09-20 MED ORDER — LIDOCAINE HCL (CARDIAC) 20 MG/ML IV SOLN
INTRAVENOUS | Status: DC | PRN
  Administered 2011-09-20: 80 mg via INTRAVENOUS

## 2011-09-20 MED ORDER — CARVEDILOL 12.5 MG PO TABS
12.5000 mg | ORAL_TABLET | Freq: Two times a day (BID) | ORAL | Status: DC
  Administered 2011-09-20 – 2011-09-22 (×4): 12.5 mg via ORAL
  Filled 2011-09-20 (×7): qty 1

## 2011-09-20 MED ORDER — SODIUM CHLORIDE 0.9 % IR SOLN
Status: DC | PRN
  Administered 2011-09-20 (×2): 1000 mL
  Administered 2011-09-20 (×2): 3000 mL

## 2011-09-20 MED ORDER — EPINEPHRINE HCL 1 MG/ML IJ SOLN
INTRAMUSCULAR | Status: AC
  Filled 2011-09-20: qty 1

## 2011-09-20 MED ORDER — ACETAMINOPHEN 325 MG PO TABS
650.0000 mg | ORAL_TABLET | Freq: Four times a day (QID) | ORAL | Status: DC | PRN
  Administered 2011-09-20 – 2011-09-21 (×2): 650 mg via ORAL
  Filled 2011-09-20 (×2): qty 2

## 2011-09-20 MED ORDER — ACETAMINOPHEN 650 MG RE SUPP: 650.0000 mg | Freq: Four times a day (QID) | RECTAL | Status: DC | PRN

## 2011-09-20 MED ORDER — ASPIRIN 325 MG PO TABS
325.0000 mg | ORAL_TABLET | Freq: Every day | ORAL | Status: DC
  Administered 2011-09-21 – 2011-09-22 (×2): 325 mg via ORAL
  Filled 2011-09-20 (×2): qty 1

## 2011-09-20 MED ORDER — POTASSIUM CHLORIDE IN NACL 20-0.9 MEQ/L-% IV SOLN
INTRAVENOUS | Status: DC
  Administered 2011-09-20 – 2011-09-21 (×2): via INTRAVENOUS
  Filled 2011-09-20 (×3): qty 1000

## 2011-09-20 MED ORDER — LORATADINE 10 MG PO TABS
10.0000 mg | ORAL_TABLET | Freq: Every day | ORAL | Status: DC
  Administered 2011-09-21 – 2011-09-22 (×2): 10 mg via ORAL
  Filled 2011-09-20 (×2): qty 1

## 2011-09-20 MED ORDER — HYDROMORPHONE HCL 2 MG PO TABS
2.0000 mg | ORAL_TABLET | ORAL | Status: DC | PRN
Start: 2011-09-20 — End: 2011-09-21
  Administered 2011-09-20 – 2011-09-21 (×3): 2 mg via ORAL
  Filled 2011-09-20 (×3): qty 1

## 2011-09-20 MED ORDER — SUCCINYLCHOLINE CHLORIDE 20 MG/ML IJ SOLN
INTRAMUSCULAR | Status: DC | PRN
  Administered 2011-09-20: 80 mg via INTRAVENOUS

## 2011-09-20 MED ORDER — ALBUMIN HUMAN 5 % IV SOLN
INTRAVENOUS | Status: DC | PRN
  Administered 2011-09-20: 10:00:00 via INTRAVENOUS

## 2011-09-20 MED ORDER — MENTHOL 3 MG MT LOZG: 1.0000 | LOZENGE | OROMUCOSAL | Status: DC | PRN

## 2011-09-20 MED ORDER — LISINOPRIL 2.5 MG PO TABS
2.5000 mg | ORAL_TABLET | Freq: Every day | ORAL | Status: DC
  Administered 2011-09-22: 2.5 mg via ORAL
  Filled 2011-09-20 (×2): qty 1

## 2011-09-20 MED ORDER — METOCLOPRAMIDE HCL 5 MG/ML IJ SOLN
5.0000 mg | Freq: Three times a day (TID) | INTRAMUSCULAR | Status: DC | PRN
  Administered 2011-09-21: 10 mg via INTRAVENOUS
  Filled 2011-09-20: qty 2

## 2011-09-20 MED ORDER — GLYCOPYRROLATE 0.2 MG/ML IJ SOLN
INTRAMUSCULAR | Status: DC | PRN
  Administered 2011-09-20: 0.4 mg via INTRAVENOUS

## 2011-09-20 MED ORDER — ELETRIPTAN HYDROBROMIDE 40 MG PO TABS
40.0000 mg | ORAL_TABLET | ORAL | Status: DC | PRN
  Filled 2011-09-20 (×2): qty 1

## 2011-09-20 MED ORDER — EPINEPHRINE HCL 1 MG/ML IJ SOLN
INTRAMUSCULAR | Status: DC | PRN
  Administered 2011-09-20: .15 mL

## 2011-09-20 MED ORDER — ONDANSETRON HCL 4 MG/2ML IJ SOLN
4.0000 mg | Freq: Once | INTRAMUSCULAR | Status: AC
  Administered 2011-09-20: 4 mg via INTRAVENOUS

## 2011-09-20 MED ORDER — PHENYLEPHRINE HCL 10 MG/ML IJ SOLN
10.0000 mg | INTRAMUSCULAR | Status: DC | PRN
  Administered 2011-09-20: 20 ug/min via INTRAVENOUS

## 2011-09-20 MED ORDER — ONDANSETRON HCL 4 MG/2ML IJ SOLN
INTRAMUSCULAR | Status: AC
  Filled 2011-09-20: qty 2

## 2011-09-20 MED ORDER — BUPIVACAINE HCL (PF) 0.25 % IJ SOLN
INTRAMUSCULAR | Status: AC
  Filled 2011-09-20: qty 30

## 2011-09-20 MED ORDER — ACETAMINOPHEN 10 MG/ML IV SOLN
INTRAVENOUS | Status: DC | PRN
  Administered 2011-09-20: 1000 mg via INTRAVENOUS

## 2011-09-20 MED ORDER — CEFAZOLIN SODIUM 1-5 GM-% IV SOLN
1.0000 g | Freq: Four times a day (QID) | INTRAVENOUS | Status: AC
  Administered 2011-09-20 – 2011-09-21 (×3): 1 g via INTRAVENOUS
  Filled 2011-09-20 (×3): qty 50

## 2011-09-20 MED ORDER — HYDROMORPHONE HCL PF 1 MG/ML IJ SOLN
0.5000 mg | INTRAMUSCULAR | Status: DC | PRN
  Administered 2011-09-21 (×2): 1 mg via INTRAVENOUS
  Filled 2011-09-20 (×2): qty 1

## 2011-09-20 MED ORDER — ROCURONIUM BROMIDE 100 MG/10ML IV SOLN
INTRAVENOUS | Status: DC | PRN
  Administered 2011-09-20: 10 mg via INTRAVENOUS
  Administered 2011-09-20: 20 mg via INTRAVENOUS
  Administered 2011-09-20: 10 mg via INTRAVENOUS
  Administered 2011-09-20: 5 mg via INTRAVENOUS

## 2011-09-20 SURGICAL SUPPLY — 77 items
ANCHOR CORKSCREW BIO 5.5 FT (Anchor) ×6 IMPLANT
BENZOIN TINCTURE PRP APPL 2/3 (GAUZE/BANDAGES/DRESSINGS) ×2 IMPLANT
BIT DRILL TAK (DRILL) IMPLANT
BLADE CUDA 5.5 (BLADE) IMPLANT
BLADE CUTTER GATOR 3.5 (BLADE) ×2 IMPLANT
BLADE GREAT WHITE 4.2 (BLADE) IMPLANT
BLADE SURG 11 STRL SS (BLADE) ×2 IMPLANT
BLADE SURG 15 STRL LF DISP TIS (BLADE) ×1 IMPLANT
BLADE SURG 15 STRL SS (BLADE) ×1
BUR GATOR 2.9 (BURR) IMPLANT
BUR OVAL 6.0 (BURR) IMPLANT
CANNULA SHOULDER 7CM (CANNULA) ×2 IMPLANT
CARTRIDGE CURVETEK MED (MISCELLANEOUS) IMPLANT
CARTRIDGE CURVETEK XLRG (MISCELLANEOUS) IMPLANT
CLOTH BEACON ORANGE TIMEOUT ST (SAFETY) ×2 IMPLANT
COVER SURGICAL LIGHT HANDLE (MISCELLANEOUS) ×2 IMPLANT
DRAPE INCISE IOBAN 66X45 STRL (DRAPES) ×4 IMPLANT
DRAPE STERI 35X30 U-POUCH (DRAPES) ×2 IMPLANT
DRAPE U-SHAPE 47X51 STRL (DRAPES) ×4 IMPLANT
DRILL TAK (DRILL)
DRSG PAD ABDOMINAL 8X10 ST (GAUZE/BANDAGES/DRESSINGS) ×4 IMPLANT
DURAPREP 26ML APPLICATOR (WOUND CARE) ×4 IMPLANT
ELECT MENISCUS 165MM 90D (ELECTRODE) IMPLANT
ELECT REM PT RETURN 9FT ADLT (ELECTROSURGICAL) ×2
ELECTRODE REM PT RTRN 9FT ADLT (ELECTROSURGICAL) ×1 IMPLANT
FILTER STRAW FLUID ASPIR (MISCELLANEOUS) ×2 IMPLANT
GAUZE XEROFORM 1X8 LF (GAUZE/BANDAGES/DRESSINGS) ×2 IMPLANT
GLOVE BIO SURGEON ST LM GN SZ9 (GLOVE) IMPLANT
GLOVE BIO SURGEON STRL SZ7.5 (GLOVE) ×2 IMPLANT
GLOVE BIOGEL PI IND STRL 8 (GLOVE) ×1 IMPLANT
GLOVE BIOGEL PI INDICATOR 8 (GLOVE) ×1
GLOVE ECLIPSE 7.0 STRL STRAW (GLOVE) ×6 IMPLANT
GLOVE SURG ORTHO 8.0 STRL STRW (GLOVE) ×2 IMPLANT
GOWN PREVENTION PLUS LG XLONG (DISPOSABLE) ×2 IMPLANT
GOWN STRL NON-REIN LRG LVL3 (GOWN DISPOSABLE) ×4 IMPLANT
KIT BASIN OR (CUSTOM PROCEDURE TRAY) ×2 IMPLANT
KIT BIO-TENODESIS 3X8 DISP (MISCELLANEOUS) ×1
KIT INSRT BABSR STRL DISP BTN (MISCELLANEOUS) ×1 IMPLANT
KIT ROOM TURNOVER OR (KITS) ×2 IMPLANT
MANIFOLD NEPTUNE II (INSTRUMENTS) ×2 IMPLANT
NDL SUT 6 .5 CRC .975X.05 MAYO (NEEDLE) ×1 IMPLANT
NEEDLE HYPO 25X1 1.5 SAFETY (NEEDLE) ×2 IMPLANT
NEEDLE MAYO TAPER (NEEDLE) ×1
NEEDLE SPNL 18GX3.5 QUINCKE PK (NEEDLE) ×2 IMPLANT
NS IRRIG 1000ML POUR BTL (IV SOLUTION) IMPLANT
PACK SHOULDER (CUSTOM PROCEDURE TRAY) ×2 IMPLANT
PAD ARMBOARD 7.5X6 YLW CONV (MISCELLANEOUS) ×4 IMPLANT
PUSHLOCK PEEK 4.5X24 (Orthopedic Implant) ×4 IMPLANT
SET ARTHROSCOPY TUBING (MISCELLANEOUS) ×1
SET ARTHROSCOPY TUBING LN (MISCELLANEOUS) ×1 IMPLANT
SLING ARM IMMOBILIZER MED (SOFTGOODS) ×2 IMPLANT
SPEAR FASTAKII (SLEEVE) IMPLANT
SPONGE GAUZE 4X4 12PLY (GAUZE/BANDAGES/DRESSINGS) ×2 IMPLANT
SPONGE LAP 4X18 X RAY DECT (DISPOSABLE) ×8 IMPLANT
STRIP CLOSURE SKIN 1/2X4 (GAUZE/BANDAGES/DRESSINGS) ×2 IMPLANT
SUCTION FRAZIER TIP 10 FR DISP (SUCTIONS) ×2 IMPLANT
SUT ETHILON 3 0 PS 1 (SUTURE) ×2 IMPLANT
SUT FIBERWIRE 2-0 18 17.9 3/8 (SUTURE) ×6
SUT PROLENE 3 0 PS 2 (SUTURE) ×2 IMPLANT
SUT VIC AB 0 CT1 27 (SUTURE) ×1
SUT VIC AB 0 CT1 27XBRD ANBCTR (SUTURE) ×1 IMPLANT
SUT VIC AB 1 CT1 27 (SUTURE) ×1
SUT VIC AB 1 CT1 27XBRD ANBCTR (SUTURE) ×1 IMPLANT
SUT VIC AB 2-0 CT1 27 (SUTURE) ×1
SUT VIC AB 2-0 CT1 TAPERPNT 27 (SUTURE) ×1 IMPLANT
SUT VICRYL 0 UR6 27IN ABS (SUTURE) ×8 IMPLANT
SUTURE FIBERWR 2-0 18 17.9 3/8 (SUTURE) ×3 IMPLANT
SYR 20CC LL (SYRINGE) ×2 IMPLANT
SYR 3ML LL SCALE MARK (SYRINGE) IMPLANT
SYR TB 1ML LUER SLIP (SYRINGE) ×2 IMPLANT
TAPE CLOTH SURG 4X10 WHT LF (GAUZE/BANDAGES/DRESSINGS) ×2 IMPLANT
TAPE STRIPS DRAPE STRL (GAUZE/BANDAGES/DRESSINGS) ×2 IMPLANT
TOWEL OR 17X24 6PK STRL BLUE (TOWEL DISPOSABLE) ×2 IMPLANT
TOWEL OR 17X26 10 PK STRL BLUE (TOWEL DISPOSABLE) ×2 IMPLANT
WAND 90 DEG TURBOVAC W/CORD (SURGICAL WAND) ×2 IMPLANT
WATER STERILE IRR 1000ML POUR (IV SOLUTION) ×2 IMPLANT
YANKAUER SUCT BULB TIP NO VENT (SUCTIONS) ×2 IMPLANT

## 2011-09-20 NOTE — Brief Op Note (Signed)
09/20/2011  11:02 AM  PATIENT:  Yousra F Roark  56 y.o. female  PRE-OPERATIVE DIAGNOSIS:  Right shoulder rotator cuff tear  POST-OPERATIVE DIAGNOSIS:  Right shoulder rotator cuff tear, slap tear  PROCEDURE:  Procedure(s): SHOULDER ARTHROSCOPY WITH ROTATOR CUFF REPAIR AND SUBACROMIAL DECOMPRESSION - biceps tenodesis  SURGEON:  Surgeon(s): Cammy Copa, MD  ASSISTANT: s vernon  ANESTHESIA:   general  EBL: 50 ml    Total I/O In: 1250 [I.V.:1000; IV Piggyback:250] Out: 200 [Blood:200]  BLOOD ADMINISTERED: none  DRAINS: none   LOCAL MEDICATIONS USED:  none  SPECIMEN:  No Specimen  COUNTS:  YES  TOURNIQUET:  * No tourniquets in log *  DICTATION: .Other Dictation: Dictation Number [id number]  PLAN OF CARE: Admit to inpatient   PATIENT DISPOSITION:  PACU - hemodynamically stable

## 2011-09-20 NOTE — Progress Notes (Signed)
Subjective:  Tolerate OR well,  She has had some nausea with anesthesia  Objective:  Vital Signs in the last 24 hours: BP 102/57  Pulse 64  Temp 97.9 F (36.6 C) (Oral)  Resp 16  SpO2 100%  Physical Exam: Pleasant WF in NAD Lungs:  Clear  Cardiac:  Regular rhythm, normal S1 and S2, no S3, soft 1/5 diastolic mumru Extremities:  No edema present, large bandage on shoulder  Telemetry: sinus  Assessment/Plan:  1. Stable post OR for shoulder surgery 2. Functioning biventricular AICD 3. 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   Rec:  Watch overnight and should be able to go home in the am.    W. Ashley Royalty  MD [facility]  09/20/2011, 5:02 PM

## 2011-09-20 NOTE — Transfer of Care (Signed)
Immediate Anesthesia Transfer of Care Note  Patient: [name]  Procedure(s) Performed: Procedure(s) (LRB) with comments: SHOULDER ARTHROSCOPY WITH ROTATOR CUFF REPAIR AND SUBACROMIAL DECOMPRESSION (Right) - Right shoulder arthroscopy, subacromial decompression, mini-open rotator cuff tear repair  Patient Location: PACU  Anesthesia Type: GA combined with regional for post-op pain  Level of Consciousness: awake, alert  and oriented  Airway & Oxygen Therapy: Patient Spontanous Breathing and Patient connected to face mask oxygen  Post-op Assessment: Report given to PACU RN, Post -op Vital signs reviewed and stable, Patient moving all extremities and Patient able to stick tongue midline  Post vital signs: Reviewed and stable  Complications: No apparent anesthesia complications

## 2011-09-20 NOTE — Progress Notes (Signed)
sopke with Leta Jungling... [phone] [employer]... She will  Have rep there for pt's case.

## 2011-09-20 NOTE — Interval H&P Note (Signed)
History and Physical Interval Note:  09/20/2011 7:57 AM  [name]  has presented today for surgery, with the diagnosis of Right shoulder rotator cuff tear  The various methods of treatment have been discussed with the patient and family. After consideration of risks, benefits and other options for treatment, the patient has consented to  Procedure(s) (LRB) with comments: SHOULDER ARTHROSCOPY WITH ROTATOR CUFF REPAIR AND SUBACROMIAL DECOMPRESSION (Right) - Right shoulder arthroscopy, subacromial decompression, mini-open rotator cuff tear repair as a surgical intervention .  The patient's history has been reviewed, patient examined, no change in status, stable for surgery.  I have reviewed the patient's chart and labs.  Questions were answered to the patient's satisfaction.     Zema Lizardo SCOTT

## 2011-09-20 NOTE — Anesthesia Procedure Notes (Addendum)
Anesthesia Regional Block:  Interscalene brachial plexus block  Pre-Anesthetic Checklist: ,, timeout performed, Correct Patient, Correct Site, Correct Laterality, Correct Procedure, Correct Position, site marked, Risks and benefits discussed,  Surgical consent,  Pre-op evaluation,  At surgeon's request and post-op pain management  Laterality: Right  Prep: Maximum Sterile Barrier Precautions used, chloraprep and alcohol swabs       Needles:  Injection technique: Single-shot  Needle Type: Stimulator Needle - 40          Additional Needles:  Procedures: nerve stimulator Interscalene brachial plexus block  Nerve Stimulator or Paresthesia:  Response: 0.5 mA, 0.1 ms, 1.5 cm  Additional Responses:   Narrative:  Start time: 09/20/2011 7:45 AM End time: 09/20/2011 7:50 AM Injection made incrementally with aspirations every 5 mL.  Performed by: Personally  Anesthesiologist: Maren Beach MD  Additional Notes: Pt accepts procedure and risks. 20cc 0.5% Marcaine w/ epi w/o difficulty or discomfort. GES   Procedure Name: Intubation Date/Time: 09/20/2011 8:22 AM Performed by: Julianne Rice K Pre-anesthesia Checklist: Patient identified, Timeout performed, Emergency Drugs available, Suction available and Patient being monitored Patient Re-evaluated:Patient Re-evaluated prior to inductionOxygen Delivery Method: Circle system utilized Preoxygenation: Pre-oxygenation with 100% oxygen Intubation Type: IV induction Ventilation: Mask ventilation without difficulty Tube type: Oral Tube size: 7.5 mm Number of attempts: 1 Airway Equipment and Method: Video-laryngoscopy and Stylet Placement Confirmation: breath sounds checked- equal and bilateral and positive ETCO2 Secured at: 22 cm Tube secured with: Tape Dental Injury: Teeth and Oropharynx as per pre-operative assessment

## 2011-09-20 NOTE — Anesthesia Preprocedure Evaluation (Addendum)
Anesthesia Evaluation  Patient identified by MRN, date of birth, ID band Patient awake    Reviewed: Allergy & Precautions, H&P , NPO status , Patient's Chart, lab work & pertinent test results, reviewed documented beta blocker date and time   History of Anesthesia Complications (+) PONV  Airway Mallampati: II      Dental   Pulmonary shortness of breath and with exertion, pneumonia -, resolved,          Cardiovascular hypertension, Pt. on medications and Pt. on home beta blockers +CHF and + DOE + dysrhythmias Atrial Fibrillation + Cardiac Defibrillator  Raynaud's disease   Neuro/Psych  Headaches, Anxiety    GI/Hepatic Neg liver ROS, GERD-  Controlled,  Endo/Other  neg diabetesHypothyroidism   Renal/GU Elevated BUN on labs     Musculoskeletal   Abdominal   Peds  Hematology negative hematology ROS (+)   Anesthesia Other Findings   Reproductive/Obstetrics                        Anesthesia Physical Anesthesia Plan  ASA: III  Anesthesia Plan: General   Post-op Pain Management:    Induction: Intravenous  Airway Management Planned: Oral ETT  Additional Equipment:   Intra-op Plan:   Post-operative Plan: Extubation in OR  Informed Consent: I have reviewed the patients History and Physical, chart, labs and discussed the procedure including the risks, benefits and alternatives for the proposed anesthesia with the patient or authorized representative who has indicated his/her understanding and acceptance.   Dental advisory given  Plan Discussed with: Anesthesiologist, Surgeon and CRNA  Anesthesia Plan Comments:        Anesthesia Quick Evaluation

## 2011-09-20 NOTE — Progress Notes (Signed)
Pt states she also took gas-x this am.

## 2011-09-21 ENCOUNTER — Encounter (HOSPITAL_COMMUNITY): Payer: Self-pay | Admitting: General Practice

## 2011-09-21 HISTORY — PX: SHOULDER ARTHROSCOPY W/ ROTATOR CUFF REPAIR: SHX2400

## 2011-09-21 MED ORDER — HYDROCODONE-ACETAMINOPHEN 10-325 MG PO TABS: 1.0000 | ORAL_TABLET | Freq: Four times a day (QID) | ORAL | Status: AC | PRN

## 2011-09-21 MED ORDER — HYDROMORPHONE HCL 2 MG PO TABS
2.0000 mg | ORAL_TABLET | ORAL | Status: DC | PRN
  Administered 2011-09-21: 2 mg via ORAL
  Administered 2011-09-21: 4 mg via ORAL
  Administered 2011-09-21: 2 mg via ORAL
  Administered 2011-09-22: 4 mg via ORAL
  Filled 2011-09-21 (×2): qty 2
  Filled 2011-09-21: qty 1
  Filled 2011-09-21: qty 2
  Filled 2011-09-21: qty 1

## 2011-09-21 MED ORDER — HYDROCODONE-ACETAMINOPHEN 10-325 MG PO TABS
1.0000 | ORAL_TABLET | ORAL | Status: DC | PRN
  Administered 2011-09-22: 2 via ORAL
  Filled 2011-09-21: qty 1
  Filled 2011-09-21: qty 2

## 2011-09-21 NOTE — Progress Notes (Signed)
Subjective: Pt stable pain controlled   Objective: Vital signs in last 24 hours: Temp:  [96.9 F (36.1 C)-97.9 F (36.6 C)] 97.8 F (36.6 C) (09/11 0549) Pulse Rate:  [57-80] 80  (09/11 0549) Resp:  [12-19] 16  (09/11 0549) BP: (91-119)/(42-66) 104/53 mmHg (09/11 0549) SpO2:  [96 %-100 %] 96 % (09/11 0549)  Intake/Output from previous day: 09/10 0701 - 09/11 0700 In: 2387.5 [I.V.:1987.5; IV Piggyback:400] Out: 500 [Urine:300; Blood:200] Intake/Output this shift:    Exam:  Sensation intact distally Intact pulses distally  Labs: No results found for this basename: HGB:5 in the last 72 hours No results found for this basename: WBC:2,RBC:2,HCT:2,PLT:2 in the last 72 hours No results found for this basename: NA:2,K:2,CL:2,CO2:2,BUN:2,CREATININE:2,GLUCOSE:2,CALCIUM:2 in the last 72 hours No results found for this basename: LABPT:2,INR:2 in the last 72 hours  Assessment/Plan: Plan dc home - norco ok per sister - change dressing before dc   Alyss Granato SCOTT 09/21/2011, 7:13 AM

## 2011-09-21 NOTE — Anesthesia Postprocedure Evaluation (Signed)
  Anesthesia Post-op Note  Patient: [name]  Procedure(s) Performed: Procedure(s) (LRB) with comments: SHOULDER ARTHROSCOPY WITH ROTATOR CUFF REPAIR AND SUBACROMIAL DECOMPRESSION (Right) - Right shoulder arthroscopy, subacromial decompression, mini-open rotator cuff tear repair  Patient Location: PACU and Nursing Unit  Anesthesia Type: General  Level of Consciousness: awake, alert  and oriented  Airway and Oxygen Therapy: Patient Spontanous Breathing  Post-op Pain: mild  Post-op Assessment: Post-op Vital signs reviewed, Patient's Cardiovascular Status Stable and Respiratory Function Stable  Post-op Vital Signs: Reviewed and stable  Complications: No apparent anesthesia complications

## 2011-09-21 NOTE — Op Note (Signed)
[name], [name] NO.:  [id number]  MEDICAL RECORD NO.:  [id number]  LOCATION:  [id number]                        FACILITY:  [facility]  PHYSICIAN:  Burnard Bunting, M.D.    DATE OF BIRTH:  22-Jan-1955  DATE OF PROCEDURE:  09/20/2011 DATE OF DISCHARGE:                              OPERATIVE REPORT   PREOPERATIVE DIAGNOSIS:  Right shoulder rotator cuff tear.  POSTOPERATIVE DIAGNOSES:  Right shoulder rotator cuff tear with type 2 superior labrum anterior-posterior tear, and bursitis.  PROCEDURE:  Right shoulder diagnostic operative arthroscopy, extensive debridement of the labrum and release of the biceps tendon, mini open rotator cuff repair with subacromial decompression.  SURGEON:  Burnard Bunting, MD  ASSISTANT:  Wende Neighbors, P.A.  ANESTHESIA:  General endotracheal.  ESTIMATED BLOOD LOSS:  70 mL.  DRAINS:  None.  INDICATIONS:  [name] is a patient with right shoulder pain presents for operative management of rotator cuff tear after CT scan shows leakage of contrast into the subacromial space.  PROCEDURE IN DETAIL:  The patient was brought to the operating room, where general endotracheal anesthesia was induced.  Preoperative antibiotics administered.  Right shoulder was examined under anesthesia and found to have full active and passive range of motion with some coarse grinding and crepitus.  The patient was placed in a beach chair position with the head in neutral position.  Right arm, shoulder, and hand was prescrubbed with alcohol and Betadine, which allowed to air dry, prepped with DuraPrep solution and draped in a sterile manner. [name] was used to cover the axilla.  Time-out was called.  Solution of saline epinephrine was placed in subacromial space.  Posterior portal was created 2 cm medial and inferior to the posterolateral margin of the acromion.  Diagnostic arthroscopy was performed.  The patient had a type 2 SLAP tear with fraying of the  biceps tendon.  This was released and the biceps was extensively debrided.  Rotator cuff tear was also visualized and leading edge of the supraspinatus measuring about 1.5 to 2 cm with retraction of about a cm.  Anterior portal was created under direct visualization to facilitate debridement and biceps tendon release.  The scope was placed in subacromial space.  Rotator cuff tear was again visualized from the bursal surface.  At this time, the instruments were removed.  Anterior and posterior portals were closed using 3-0 nylon.  Reprepping with DuraPrep was performed and the entire shoulder was then covered with Ioban.  Incision was made off the anterolateral margin of the acromion.  Skin and subcutaneous tissues were sharply divided.  Deltoid was split at a measured distance of 4 cm from the anterolateral margin of the acromion.  Stay suture was placed. At this time, the tear was visualized.  It was about 1.5 x 2 cm with retraction involving the entire supraspinatus, part of the infraspinatus.  In general, this is a degenerative type tear with uncovering of the footprint.  The degenerative portion of the tendon had to be debrided.  At this time, following a debridement and mobilization of the rotator cuff tendon, three 45 corkscrews were placed at the articular surface footprint margin.  Each of the  4 sutures on the corkscrews were utilized.  It should be noted that 5 Vicryl stay sutures were placed in order to facilitate mobilization of the tendon.  The 12 sutures were then placed in mattress fashion through the edge of the rotator cuff tendon which was then mobilized back towards the footprint. It was then secured with 2 push locks.  Bone quality was marginal but the push locks did hold in the humeral head.  At this time, the arm was placed on a Mayo and externally rotated.  Transverse humeral ligament was incised.  Because the patient already had 2 corkscrews and 2 push locks placed,  it was decided not to perform a standard biceps tenodesis. Instead the transverse humeral ligament was partially incised.  Biceps tendon was appropriately tensioned and then it was sutured to the transverse humeral ligament using six 2-0 sutures.  Bicipital tuberosity was prepared.  Again bone quality had issues in the metaphyseal region and did not want to interfere with the push locks.  Secure fixation was achieved.  Thorough irrigation was performed.  Deltoid split was repaired using #1 Vicryl suture followed by interrupted inverted 2-0 Vicryl suture and a running 3-0 Prolene.  Xeroform bulky dressing was applied.  The patient tolerated the procedure well without immediate complications.  [name]'s assistance was required at all times during the case for retraction, limb positioning, opening closing.  Her assistance was a medical necessity.     Burnard Bunting, M.D.     GSD/MEDQ  D:  09/20/2011  T:  09/21/2011  Job:  [id number]

## 2011-09-21 NOTE — Progress Notes (Signed)
Occupational Therapy Evaluation Patient Details Name: [name] MRN: [id number] DOB: [birth year] Today's Date: 09/21/2011 Time: 7829-5621 OT Time Calculation (min): 57 min  OT Assessment / Plan / Recommendation Clinical Impression  56 yo s/p R RTC repair. Educated pt on sling use, compensatory techniques for ADL adhering to restrictions and HEO for elbow/wrist and hand. Sister present and educated. Sister verbalized understanding. Pt will benefit from skilled OT services due to below deficits. Pt will need HH services initially then transition to outpt services. Pt not feeling well this pm due to nausea and pian. Will see again in am to review acute home shoulder protocol.    OT Assessment  Patient needs continued OT Services    Follow Up Recommendations  Home health OT    Barriers to Discharge None    Equipment Recommendations  None recommended by OT    Recommendations for Other Services    Frequency  Min 3X/week    Precautions / Restrictions Precautions Precautions: Shoulder Type of Shoulder Precautions: no shoulder ROM unless noted by Dr, August Saucer. elbow wrist hand ROM Precaution Booklet Issued: Yes (comment) Required Braces or Orthoses: Other Brace/Splint (R sling) Restrictions Weight Bearing Restrictions: Yes RUE Weight Bearing: Non weight bearing   Pertinent Vitals/Pain 5. nsg aware    ADL  Eating/Feeding: Performed;Set up Where Assessed - Eating/Feeding: Chair Grooming: Simulated;Moderate assistance Where Assessed - Grooming: Supported sitting Upper Body Bathing: Performed;Moderate assistance Where Assessed - Upper Body Bathing: Supported sitting Lower Body Bathing: Simulated;Moderate assistance Where Assessed - Lower Body Bathing: Supported sit to stand Upper Body Dressing: Simulated;Maximal assistance Where Assessed - Upper Body Dressing: Supported sitting Lower Body Dressing: Simulated;Moderate assistance Where Assessed - Lower Body Dressing: Supported sit  to Pharmacist, hospital: Mining engineer Method: Sit to Barista: Other (comment) (bed - chair) Toileting - Clothing Manipulation and Hygiene: Simulated;Maximal assistance Where Assessed - Toileting Clothing Manipulation and Hygiene: Standing Transfers/Ambulation Related to ADLs: Min A mostly due to feelings of n/v ADL Comments: Educated pt/sister on shoulder protocol for ADL. written information given.     OT Diagnosis: Generalized weakness;Acute pain  OT Problem List: Decreased strength;Decreased range of motion;Decreased activity tolerance;Decreased knowledge of use of DME or AE;Decreased knowledge of precautions;Impaired UE functional use;Pain OT Treatment Interventions: Self-care/ADL training;Therapeutic exercise;Energy conservation;DME and/or AE instruction;Therapeutic activities;Patient/family education   OT Goals Acute Rehab OT Goals OT Goal Formulation: With patient Time For Goal Achievement: 09/28/11 Potential to Achieve Goals: Good ADL Goals Pt Will Perform Upper Body Bathing: with min assist;with caregiver independent in assisting;Sit to stand from chair;Unsupported;with cueing (comment type and amount) ADL Goal: Upper Body Bathing - Progress: Goal set today Pt Will Perform Upper Body Dressing: with mod assist;with caregiver independent in assisting;Sit to stand from chair;Unsupported;with cueing (comment type and amount) ADL Goal: Upper Body Dressing - Progress: Goal set today Additional ADL Goal #1: pt/family will independently donn/doff sling and verbalize appropriate wearing schedule. ADL Goal: Additional Goal #1 - Progress: Goal set today Arm Goals Additional Arm Goal #1: pt/family will be independecnt with elbow A/AAROM , wrist and hand ex. Arm Goal: Additional Goal #1 - Progress: Goal set today  Visit Information  Last OT Received On: 09/21/11    Subjective Data      Prior Functioning  Vision/Perception   Home Living Lives With: Spouse;Son Available Help at Discharge: Available 24 hours/day Type of Home: House Home Access: Stairs to enter [employer] of Steps: 2 Entrance Stairs-Rails: Left Home Layout: Two level;Able to  live on main level with bedroom/bathroom Bathroom Shower/Tub: Engineer, manufacturing systems: Handicapped height Bathroom Accessibility: Yes How Accessible: Accessible via walker Home Adaptive Equipment: Walker - rolling;Shower chair with back;Reacher (lift chair) Additional Comments: sisters are available to help x 2 weeks Prior Function Level of Independence: Independent Able to Take Stairs?: Yes Driving: Yes Vocation: On disability Communication Communication: No difficulties Dominant Hand: Right      Cognition  Overall Cognitive Status: Appears within functional limits for tasks assessed/performed Arousal/Alertness: Awake/alert Orientation Level: Appears intact for tasks assessed Behavior During Session: [facility] for tasks performed    Extremity/Trunk Assessment Right Upper Extremity Assessment RUE ROM/Strength/Tone: Deficits;Due to pain;Due to precautions RUE ROM/Strength/Tone Deficits: RTC repair RUE Sensation: Deficits (due to surgery) Left Upper Extremity Assessment LUE ROM/Strength/Tone: WFL for tasks assessed (previous RTC surgery) Right Lower Extremity Assessment RLE ROM/Strength/Tone: [facility] for tasks assessed Left Lower Extremity Assessment LLE ROM/Strength/Tone: [facility] for tasks assessed Trunk Assessment Trunk Assessment: Other exceptions (history of back surgery)   Mobility    Bed Mobility Bed Mobility: Not assessed (pt states she will sleep in recliner) Transfers Transfers: Sit to Stand;Stand to Sit Sit to Stand: 4: Min assist;With upper extremity assist;From chair/3-in-1 Stand to Sit: 4: Min guard;With upper extremity assist;To chair/3-in-1 Details for Transfer Assistance: assist due to not feeling well       Exercise General  Exercises - Upper Extremity Elbow Flexion: AROM;AAROM;Right;10 reps;Seated Elbow Extension: AROM;AAROM;Right;10 reps;Seated;Standing Wrist Flexion: AROM;Right;10 reps Wrist Extension: AROM;Right;10 reps Digit Composite Flexion: AROM;Right;10 reps Composite Extension: AROM;Right;10 reps   Balance     End of Session OT - End of Session Activity Tolerance: Patient limited by fatigue;Patient limited by pain Patient left: in chair;with call bell/phone within reach;with family/visitor present Nurse Communication: Other (comment) (c/o nausea)  GO     Arliss Frisina,HILLARY 09/21/2011, 5:02 PM [facility], OTR/L  [phone] 09/21/2011

## 2011-09-21 NOTE — Progress Notes (Signed)
Subjective:  C/o nausea today post taking dilaudid. Denies SOB or chest pain.  Objective:  Vital Signs in the last 24 hours: BP 104/53  Pulse 80  Temp 97.8 F (36.6 C) (Oral)  Resp 16  SpO2 96%  Physical Exam: Pleasant WF in NAD Lungs:  Clear  Cardiac:  Regular rhythm, normal S1 and S2, no S3, soft 1/5 diastolic murmur Extremities:  No edema present, large bandage on shoulder  Telemetry: Sinus with paced ventricular  Assessment/Plan:  1. Stable post OR for shoulder surgery 2. Functioning biventricular AICD 3. 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   Rec:  OK for d/c from cardiac viewpoint.  Keep regular appointment.   Darden Palmer  MD [facility] Card[facility]  09/21/2011, 9:01 AM

## 2011-09-21 NOTE — Progress Notes (Signed)
Utilization review completed. Kelby Adell, RN, BSN. 

## 2011-09-22 MED ORDER — HYDROMORPHONE HCL 2 MG PO TABS: 2.0000 mg | ORAL_TABLET | ORAL | Status: AC | PRN

## 2011-09-22 NOTE — Progress Notes (Signed)
Occupational Therapy Treatment Patient Details Name: [name] MRN: [id number] DOB: 01/06/1956 Today's Date: 09/22/2011 Time: 4098-1191 OT Time Calculation (min): 25 min  OT Assessment / Plan / Recommendation Comments on Treatment Session Pt progressing with therapy. Continue to recommend HHOT followed by OPOT    Follow Up Recommendations       Barriers to Discharge       Equipment Recommendations       Recommendations for Other Services    Frequency     Plan Discharge plan remains appropriate    Precautions / Restrictions Precautions Precautions: Shoulder Type of Shoulder Precautions: no shoulder ROM unless noted by Dr, August Saucer. elbow wrist hand ROM Required Braces or Orthoses:  (R sling) Restrictions RUE Weight Bearing: Non weight bearing   Pertinent Vitals/Pain Pt reports 4/10 left shoulder pain. Pre-medicated and instructed on LUE support in seated and lying positions    ADL  Lower Body Dressing: Performed;Minimal assistance Where Assessed - Lower Body Dressing: Supported sit to stand Toilet Transfer: Performed;Min Pension scheme manager Method: Sit to Barista: Regular height toilet Toileting - Clothing Manipulation and Hygiene: Performed;Moderate assistance Where Assessed - Toileting Clothing Manipulation and Hygiene: Standing Transfers/Ambulation Related to ADLs: Min HHA progressing to Min guard A for short distances ADL Comments: Educated pt on R sling donning, doffing and correct positioning. Pt able to verbalize understanding. Able to don sling with Min A. Sister in room taking notes. Also, educated on ROM exercises and adaptive techniques for ADLs.    OT Diagnosis:    OT Problem List:   OT Treatment Interventions:     OT Goals ADL Goals ADL Goal: Upper Body Bathing - Progress: Progressing toward goals ADL Goal: Upper Body Dressing - Progress: Progressing toward goals ADL Goal: Additional Goal #1 - Progress: Progressing toward  goals Arm Goals Arm Goal: Additional Goal #1 - Progress: Progressing toward goals  Visit Information  Last OT Received On: 09/22/11 Assistance Needed: +1    Subjective Data      Prior Functioning  Home Living Lives With: Spouse;Son Available Help at Discharge: Available 24 hours/day Type of Home: House Home Access: Stairs to enter [employer] of Steps: 2 Entrance Stairs-Rails: Left Home Layout: Two level;Able to live on main level with bedroom/bathroom Bathroom Shower/Tub: Engineer, manufacturing systems: Handicapped height Bathroom Accessibility: Yes How Accessible: Accessible via walker Home Adaptive Equipment: Walker - rolling;Shower chair with back;Reacher Additional Comments: sisters are available to help x 2 weeks Prior Function Level of Independence: Independent Able to Take Stairs?: Yes Driving: Yes Vocation: On disability Communication Communication: No difficulties Dominant Hand: Right    Cognition  Overall Cognitive Status: Appears within functional limits for tasks assessed/performed Arousal/Alertness: Awake/alert Orientation Level: Appears intact for tasks assessed Behavior During Session: Lethargic Cognition - Other Comments: lethargic due to recent nausea medication    Mobility  Shoulder Instructions Transfers Transfers: Sit to Stand;Stand to Sit Sit to Stand: 4: Min guard;From bed;From chair/3-in-1 Stand to Sit: 4: Min guard;To bed;To chair/3-in-1       Exercises  General Exercises - Upper Extremity Elbow Flexion: AROM;15 reps;Right;Seated Elbow Extension: AROM;15 reps;Right;Seated Wrist Flexion: AROM;15 reps;Seated;Right Wrist Extension: AROM;Right;15 reps;Seated Digit Composite Flexion: AROM;Right;15 reps;Seated Composite Extension: AROM;Right;15 reps;Seated   Balance     End of Session OT - End of Session Equipment Utilized During Treatment: Gait belt Activity Tolerance: Patient limited by fatigue Patient left: in  chair;with call bell/phone within reach;with family/visitor present Nurse Communication: Mobility status  GO  [name] 09/22/2011, 2:35 PM

## 2011-09-22 NOTE — Progress Notes (Signed)
Pt stable pain controlled - incision ok - for dc today

## 2011-09-22 NOTE — Progress Notes (Signed)
Subjective:  Kept overnight because of nausea that is better today.  Denies SOB or chest pain.  Objective:  Vital Signs in the last 24 hours: BP 114/50  Pulse 82  Temp 98.3 F (36.8 C) (Oral)  Resp 16  SpO2 98%  Physical Exam: Pleasant WF in NAD Lungs:  Clear  Cardiac:  Regular rhythm, normal S1 and S2, no S3, soft 1/5 diastolic murmur Extremities:  No edema present, large bandage on shoulder  Telemetry: Sinus with paced ventricular  Assessment/Plan:  1. Stable post OR for shoulder surgery 2. Functioning biventricular AICD 3. 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   Rec:  OK for d/c from cardiac viewpoint again today.  No cardiac issues overnight.  Keep regular appointment.   Darden Palmer  MD [facility] Card[facility]  09/22/2011, 9:24 AM

## 2011-09-22 NOTE — Progress Notes (Signed)
CARE MANAGEMENT NOTE 09/22/2011  Patient:  [name], [name]   Account Number:  [id number]  Date Initiated:  09/22/2011  Documentation initiated by:  Vance Peper  Subjective/Objective Assessment:   56 yr old female s/p right shoulder arthroplasty     Action/Plan:   CM spoke with patient regarding home health needs. Choice offered. Patient has no needs for [facility] RN.   Anticipated DC Date:  09/22/2011   Anticipated DC Plan:  HOME W HOME HEALTH SERVICES      DC Planning Services  CM consult      [facility] Choice  HOME HEALTH   Choice offered to / List presented to:  C-1 Patient        HH arranged  HH-2 PT  HH-3 OT      [facility] agency  [facility]   Status of service:  Completed, signed off Medicare Important Message given?   (If response is "NO", the following Medicare IM given date fields will be blank) Date Medicare IM given:   Date Additional Medicare IM given:    Discharge Disposition:  HOME W HOME HEALTH SERVICES  Per UR Regulation:    If discussed at Long Length of Stay Meetings, dates discussed:    Comments:

## 2011-09-25 NOTE — Discharge Summary (Signed)
Physician Discharge Summary  Patient ID: [name] MRN: [id number] DOB/AGE: 03-21-1955 56 y.o.  Admit date: 09/20/2011 Discharge date: 09/25/2011  Admission Diagnoses:  Active Problems:  AICD (automatic cardioverter/defibrillator) present rotator cuff tear  Discharge Diagnoses:  Same  Surgeries: Procedure(s): SHOULDER ARTHROSCOPY WITH ROTATOR CUFF REPAIR AND SUBACROMIAL DECOMPRESSION on 09/20/2011   Consultants: Treatment Team:  Othella Boyer, MD  Discharged Condition: [facility] Hospital Course: [name] is an 56 y.o. female who was admitted 09/20/2011 with a chief complaint of right shoulder pain, and found to have a diagnosis of rotator cuff tear.  They were brought to the operating room on 09/20/2011 and underwent the above named procedures.She was seen by OT and started on ROM exercises post op. Dced POD 2.  Antibiotics given:  Anti-infectives     Start     Dose/Rate Route Frequency Ordered Stop   09/20/11 1600   ceFAZolin (ANCEF) IVPB 1 g/50 mL premix        1 g 100 mL/hr over 30 Minutes Intravenous Every 6 hours 09/20/11 1403 09/21/11 0430   09/19/11 1424   ceFAZolin (ANCEF) IVPB 2 g/50 mL premix        2 g 100 mL/hr over 30 Minutes Intravenous 60 min pre-op 09/19/11 1424 09/20/11 0809        .  Recent vital signs:  Filed Vitals:   09/22/11 0524  BP: 114/50  Pulse: 82  Temp: 98.3 F (36.8 C)  Resp: 16    Recent laboratory studies:  Results for orders placed during the hospital encounter of 09/14/11  SURGICAL PCR SCREEN      Component Value Range   MRSA, PCR NEGATIVE  NEGATIVE   Staphylococcus aureus NEGATIVE  NEGATIVE  BASIC METABOLIC PANEL      Component Value Range   Sodium 138  135 - 145 mEq/L   Potassium 4.3  3.5 - 5.1 mEq/L   Chloride 101  96 - 112 mEq/L   CO2 31  19 - 32 mEq/L   Glucose, Bld 98  70 - 99 mg/dL   BUN 26 (*) 6 - 23 mg/dL   Creatinine, Ser 4.09 (*) 0.50 - 1.10 mg/dL   Calcium 9.7  8.4 - 81.1 mg/dL   GFR calc non  Af Amer 48 (*) >90 mL/min   GFR calc Af Amer 56 (*) >90 mL/min  CBC      Component Value Range   WBC 7.6  4.0 - 10.5 K/uL   RBC 4.34  3.87 - 5.11 MIL/uL   Hemoglobin 13.1  12.0 - 15.0 g/dL   HCT 91.4  78.2 - 95.6 %   MCV 89.6  78.0 - 100.0 fL   MCH 30.2  26.0 - 34.0 pg   MCHC 33.7  30.0 - 36.0 g/dL   RDW 21.3  08.6 - 57.8 %   Platelets 293  150 - 400 K/uL    Discharge Medications:     Medication List     As of 09/25/2011  8:11 AM    TAKE these medications         acetaminophen 500 MG tablet   Commonly known as: TYLENOL   Take 1,000 mg by mouth 2 (two) times daily.      carvedilol 12.5 MG tablet   Commonly known as: COREG   Take 12.5 mg by mouth 2 (two) times daily with a meal.      eletriptan 40 MG tablet   Commonly known as: RELPAX   One  tablet by mouth as needed for migraine headache.  If the headache improves and then returns, dose may be repeated after 2 hours have elapsed since first dose (do not exceed 80 mg per day). For migraine. may repeat in 2 hours if necessary      fexofenadine 180 MG tablet   Commonly known as: ALLEGRA   Take 180 mg by mouth daily.      fluticasone 50 MCG/ACT nasal spray   Commonly known as: FLONASE   Place 1 spray into the nose 2 (two) times daily.      furosemide 40 MG tablet   Commonly known as: LASIX   Take 20 mg by mouth daily.      HYDROcodone-acetaminophen 10-325 MG per tablet   Commonly known as: NORCO   Take 1 tablet by mouth every 6 (six) hours as needed for pain.      HYDROmorphone 2 MG tablet   Commonly known as: DILAUDID   Take 1-2 tablets (2-4 mg total) by mouth every 3 (three) hours as needed.      imipramine 50 MG tablet   Commonly known as: TOFRANIL   Take 100 mg by mouth at bedtime.      levothyroxine 125 MCG tablet   Commonly known as: SYNTHROID, LEVOTHROID   Take 125 mcg by mouth daily. 1 tablet daily      lisinopril 2.5 MG tablet   Commonly known as: PRINIVIL,ZESTRIL   Take 2.5 mg by mouth daily.       polyethylene glycol packet   Commonly known as: MIRALAX / GLYCOLAX   Take 17 g by mouth daily as needed. For constipation.      spironolactone 25 MG tablet   Commonly known as: ALDACTONE   Take 25 mg by mouth daily. 1 tablet daily        Diagnostic Studies: No results found.  Disposition: 06-Home-Health Care Svc      Discharge Orders    Future Orders Please Complete By Expires   Diet - low sodium heart healthy      Diet - low sodium heart healthy      Call MD / Call 911      Comments:   If you experience chest pain or shortness of breath, CALL 911 and be transported to the hospital emergency room.  If you develope a fever above 101 F, pus (white drainage) or increased drainage or redness at the wound, or calf pain, call your surgeon's office.   Constipation Prevention      Comments:   Drink plenty of fluids.  Prune juice may be helpful.  You may use a stool softener, such as Colace (over the counter) 100 mg twice a day.  Use MiraLax (over the counter) for constipation as needed.   Increase activity slowly as tolerated      Discharge instructions      Comments:   Keep arm in sling except during CPM machine time 2. Keep incision dry 3. CPM 1 hour today then 1 hour 3 times a day beginning Thursday   Change dressing      Apply dressing      Call MD / Call 911      Comments:   If you experience chest pain or shortness of breath, CALL 911 and be transported to the hospital emergency room.  If you develope a fever above 101 F, pus (white drainage) or increased drainage or redness at the wound, or calf pain, call your surgeon's office.  Constipation Prevention      Comments:   Drink plenty of fluids.  Prune juice may be helpful.  You may use a stool softener, such as Colace (over the counter) 100 mg twice a day.  Use MiraLax (over the counter) for constipation as needed.   Increase activity slowly as tolerated            Signed: DEAN,GREGORY SCOTT 09/25/2011, 8:11 AM

## 2011-11-03 DIAGNOSIS — G4700 Insomnia, unspecified: Secondary | ICD-10-CM | POA: Insufficient documentation

## 2011-12-01 ENCOUNTER — Emergency Department (HOSPITAL_BASED_OUTPATIENT_CLINIC_OR_DEPARTMENT_OTHER)
Admission: EM | Admit: 2011-12-01 | Discharge: 2011-12-01 | Disposition: A | Discharge status: Home / Self Care | Payer: BC Managed Care – PPO | Attending: Emergency Medicine | Admitting: Emergency Medicine

## 2011-12-01 ENCOUNTER — Encounter (HOSPITAL_BASED_OUTPATIENT_CLINIC_OR_DEPARTMENT_OTHER): Payer: Self-pay | Admitting: *Deleted

## 2011-12-01 DIAGNOSIS — I4891 Unspecified atrial fibrillation: Secondary | ICD-10-CM | POA: Insufficient documentation

## 2011-12-01 DIAGNOSIS — I447 Left bundle-branch block, unspecified: Secondary | ICD-10-CM | POA: Insufficient documentation

## 2011-12-01 DIAGNOSIS — R51 Headache: Secondary | ICD-10-CM

## 2011-12-01 DIAGNOSIS — I7300 Raynaud's syndrome without gangrene: Secondary | ICD-10-CM | POA: Insufficient documentation

## 2011-12-01 DIAGNOSIS — Z8739 Personal history of other diseases of the musculoskeletal system and connective tissue: Secondary | ICD-10-CM | POA: Insufficient documentation

## 2011-12-01 DIAGNOSIS — E0590 Thyrotoxicosis, unspecified without thyrotoxic crisis or storm: Secondary | ICD-10-CM | POA: Insufficient documentation

## 2011-12-01 DIAGNOSIS — R110 Nausea: Secondary | ICD-10-CM | POA: Insufficient documentation

## 2011-12-01 DIAGNOSIS — Z8709 Personal history of other diseases of the respiratory system: Secondary | ICD-10-CM | POA: Insufficient documentation

## 2011-12-01 DIAGNOSIS — M129 Arthropathy, unspecified: Secondary | ICD-10-CM | POA: Insufficient documentation

## 2011-12-01 DIAGNOSIS — Z8701 Personal history of pneumonia (recurrent): Secondary | ICD-10-CM | POA: Insufficient documentation

## 2011-12-01 DIAGNOSIS — J329 Chronic sinusitis, unspecified: Secondary | ICD-10-CM | POA: Insufficient documentation

## 2011-12-01 DIAGNOSIS — I428 Other cardiomyopathies: Secondary | ICD-10-CM | POA: Insufficient documentation

## 2011-12-01 DIAGNOSIS — Z85828 Personal history of other malignant neoplasm of skin: Secondary | ICD-10-CM | POA: Insufficient documentation

## 2011-12-01 DIAGNOSIS — F411 Generalized anxiety disorder: Secondary | ICD-10-CM | POA: Insufficient documentation

## 2011-12-01 DIAGNOSIS — K219 Gastro-esophageal reflux disease without esophagitis: Secondary | ICD-10-CM | POA: Insufficient documentation

## 2011-12-01 DIAGNOSIS — I509 Heart failure, unspecified: Secondary | ICD-10-CM | POA: Insufficient documentation

## 2011-12-01 DIAGNOSIS — J3489 Other specified disorders of nose and nasal sinuses: Secondary | ICD-10-CM | POA: Insufficient documentation

## 2011-12-01 MED ORDER — LORAZEPAM 2 MG/ML IJ SOLN
1.0000 mg | Freq: Once | INTRAMUSCULAR | Status: DC
  Filled 2011-12-01: qty 1

## 2011-12-01 MED ORDER — HYDROMORPHONE HCL PF 1 MG/ML IJ SOLN
1.0000 mg | Freq: Once | INTRAMUSCULAR | Status: AC
  Administered 2011-12-01: 1 mg via INTRAVENOUS
  Filled 2011-12-01: qty 1

## 2011-12-01 MED ORDER — METOCLOPRAMIDE HCL 5 MG/ML IJ SOLN
10.0000 mg | Freq: Once | INTRAMUSCULAR | Status: AC
  Administered 2011-12-01: 10 mg via INTRAVENOUS
  Filled 2011-12-01: qty 2

## 2011-12-01 MED ORDER — AMOXICILLIN-POT CLAVULANATE 875-125 MG PO TABS: 1.0000 | ORAL_TABLET | Freq: Two times a day (BID) | ORAL | Status: DC

## 2011-12-01 MED ORDER — KETOROLAC TROMETHAMINE 30 MG/ML IJ SOLN
30.0000 mg | Freq: Once | INTRAMUSCULAR | Status: AC
  Administered 2011-12-01: 30 mg via INTRAVENOUS
  Filled 2011-12-01: qty 1

## 2011-12-01 MED ORDER — SODIUM CHLORIDE 0.9 % IV SOLN
Freq: Once | INTRAVENOUS | Status: AC
  Administered 2011-12-01: 15:00:00 via INTRAVENOUS

## 2011-12-01 MED ORDER — DIPHENHYDRAMINE HCL 50 MG/ML IJ SOLN
25.0000 mg | Freq: Once | INTRAMUSCULAR | Status: DC
  Filled 2011-12-01: qty 1

## 2011-12-01 NOTE — ED Provider Notes (Signed)
History     CSN: [id number]  Arrival date & time 12/01/11  1240   First MD Initiated Contact with Patient 12/01/11 1333      Chief Complaint  Patient presents with  . Migraine    (Consider location/radiation/quality/duration/timing/severity/associated sxs/prior treatment) Patient is a 56 y.o. female presenting with migraines. The history is provided by the patient. No language interpreter was used.  Migraine This is a new problem. The current episode started today. The problem occurs constantly. The problem has been gradually worsening. Associated symptoms include congestion, headaches and nausea. Nothing aggravates the symptoms. She has tried nothing for the symptoms. The treatment provided moderate relief.   Pt complains of a headache since last night.  Pt reports no relief from her home medications.  Pt had a shot at her MD's office with no relief.  Pt reports sinus pressure and sinus pain Past Medical History  Diagnosis Date  . Pleural effusion, right     thoracentesis 02/09/09   . CHF (congestive heart failure)     Dr. Donnie Aho  . Cardiomyopathy   . Hypertension     essential- benign  . Aortic valve disorder   . LBBB (left bundle branch block)     conduction disorder  . Migraines   . Osteoporosis   . Hypothyroidism   . Anxiety 01/20/11    "in the past; not now"  . Raynaud's disease   . Fibroids   . Scoliosis   . AF (paroxysmal atrial fibrillation)   . H/O: hysterectomy     secondary to uterine prolapse  . Hyperthyroidism   . ICD (implantable cardiac defibrillator) in place   . Pneumonia   . GERD (gastroesophageal reflux disease)     otc  . 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 Complication of anesthesia     aspiration  . PONV (postoperative nausea and vomiting)   . Family history of anesthesia complication     nausea  . Shortness of breath   . Arthritis     Past Surgical History  Procedure Date  . Vesicovaginal fistula closure  w/ tah   . Thyroid lobectomy ~ 2006    right  . Rotator cuff repair ~ 2009    left  . Cardiac defibrillator placement 01/20/11  . Cholecystectomy ~1996  . Tubal ligation ~ 1983  . Vaginal hysterectomy ~ 2009  . Back surgery 12/31/2009    "for flat back syndrome; fused bottom of my back"  . Foot neuroma surgery ~ 2003    right  . Skin cancer excision     nose, forehead, left leg  . Scoliosis surgery   . Shoulder arthroscopy w/ rotator cuff repair 09/21/2011    Family History  Problem Relation Age of Onset  . Heart disease Mother   . Heart disease Sister   . Heart disease Father 57  . Macular degeneration Father   . Heart failure Mother 19    CHF  . Stroke Mother   . Hypertension Mother   . Atrial fibrillation Sister   . Heart disease Sister     History  Substance Use Topics  . Smoking status: Never Smoker   . Smokeless tobacco: Never Used  . Alcohol Use: No    OB History    Grav Para Term Preterm Abortions TAB SAB Ect Mult Living                  Review of Systems  HENT: Positive for congestion, rhinorrhea and sinus pressure.   Gastrointestinal: Positive for nausea.  Neurological: Positive for headaches.  All other systems reviewed and are negative.    Allergies  Codeine; Fentanyl; Methocarbamol; and Morphine and related  Home Medications   Current Outpatient Rx  Name  Route  Sig  Dispense  Refill  . ACETAMINOPHEN 500 MG PO TABS   Oral   Take 1,000 mg by mouth 2 (two) times daily.          Marland Kitchen CARVEDILOL 12.5 MG PO TABS   Oral   Take 12.5 mg by mouth 2 (two) times daily with a meal.           . ELETRIPTAN HYDROBROMIDE 40 MG PO TABS   Oral   One tablet by mouth as needed for migraine headache.  If the headache improves and then returns, dose may be repeated after 2 hours have elapsed since first dose (do not exceed 80 mg per day). For migraine. may repeat in 2 hours if necessary          . FEXOFENADINE HCL 180 MG PO TABS   Oral   Take 180 mg  by mouth daily.         Marland Kitchen FLUTICASONE PROPIONATE 50 MCG/ACT NA SUSP   Nasal   Place 1 spray into the nose 2 (two) times daily.           . FUROSEMIDE 40 MG PO TABS   Oral   Take 20 mg by mouth daily.         . IMIPRAMINE HCL 50 MG PO TABS   Oral   Take 100 mg by mouth at bedtime.          Marland Kitchen LEVOTHYROXINE SODIUM 125 MCG PO TABS   Oral   Take 125 mcg by mouth daily. 1 tablet daily         . LISINOPRIL 2.5 MG PO TABS   Oral   Take 2.5 mg by mouth daily.           Marland Kitchen POLYETHYLENE GLYCOL 3350 PO PACK   Oral   Take 17 g by mouth daily as needed. For constipation.          . SPIRONOLACTONE 25 MG PO TABS   Oral   Take 25 mg by mouth daily. 1 tablet daily           BP 102/55  Pulse 82  Temp 97.3 F (36.3 C) (Oral)  Resp 16  Ht 5\' 8"  (1.727 m)  Wt 155 lb (70.308 kg)  BMI 23.57 kg/m2  SpO2 98%  Physical Exam  Nursing note and vitals reviewed. Constitutional: She is oriented to person, place, and time. She appears well-developed and well-nourished.  HENT:  Head: Normocephalic and atraumatic.  Right Ear: External ear normal.  Left Ear: External ear normal.  Nose: Nose normal.  Mouth/Throat: Oropharynx is clear and moist.  Eyes: Conjunctivae normal and EOM are normal. Pupils are equal, round, and reactive to light.  Neck: Normal range of motion. Neck supple.  Cardiovascular: Normal rate and normal heart sounds.   Pulmonary/Chest: Effort normal.  Abdominal: Soft.  Musculoskeletal: Normal range of motion.  Neurological: She is alert and oriented to person, place, and time.  Skin: Skin is warm and dry.  Psychiatric: She has a normal mood and affect.    ED Course  Procedures (including critical care time)  Labs Reviewed - No data to display No results found.   No  diagnosis found.    MDM  Pt given Iv fluid, reglan , torodol and dilaudid.  Pt reports improved headache        Lonia Skinner [place], [place] 12/01/11 1623

## 2011-12-01 NOTE — ED Notes (Signed)
Family at bedside. 

## 2011-12-01 NOTE — ED Notes (Signed)
Pt. Reports nausea with headache and light causing pain in her eyes.  Pt. Reports pain in  The right side of her mouth face and R side of head since last night.  Pt. Verbalizes with no stuttering and no neuro deficits noted.  Pt. Able to follow all commands and has no s/s of nausea or vomiting at this time.

## 2011-12-01 NOTE — ED Notes (Signed)
Patient is resting comfortably. 

## 2011-12-01 NOTE — ED Provider Notes (Signed)
Medical screening examination/treatment/procedure(s) were performed by non-physician practitioner and as supervising physician I was immediately available for consultation/collaboration.   Glynn Octave, MD 12/01/11 [phone]

## 2011-12-01 NOTE — ED Notes (Signed)
Patient states she has a hx of migraine headaches.  States all week she has had a headache.  Last night at 5pm the headache worsened and her realpax is not helping.

## 2011-12-02 ENCOUNTER — Emergency Department (HOSPITAL_BASED_OUTPATIENT_CLINIC_OR_DEPARTMENT_OTHER)
Admission: EM | Admit: 2011-12-02 | Discharge: 2011-12-02 | Disposition: A | Discharge status: Home / Self Care | Payer: BC Managed Care – PPO | Attending: Emergency Medicine | Admitting: Emergency Medicine

## 2011-12-02 ENCOUNTER — Encounter (HOSPITAL_BASED_OUTPATIENT_CLINIC_OR_DEPARTMENT_OTHER): Payer: Self-pay | Admitting: *Deleted

## 2011-12-02 ENCOUNTER — Emergency Department (HOSPITAL_BASED_OUTPATIENT_CLINIC_OR_DEPARTMENT_OTHER): Payer: BC Managed Care – PPO

## 2011-12-02 DIAGNOSIS — I10 Essential (primary) hypertension: Secondary | ICD-10-CM | POA: Insufficient documentation

## 2011-12-02 DIAGNOSIS — I7300 Raynaud's syndrome without gangrene: Secondary | ICD-10-CM | POA: Insufficient documentation

## 2011-12-02 DIAGNOSIS — R209 Unspecified disturbances of skin sensation: Secondary | ICD-10-CM | POA: Insufficient documentation

## 2011-12-02 DIAGNOSIS — M810 Age-related osteoporosis without current pathological fracture: Secondary | ICD-10-CM | POA: Insufficient documentation

## 2011-12-02 DIAGNOSIS — Z8719 Personal history of other diseases of the digestive system: Secondary | ICD-10-CM | POA: Insufficient documentation

## 2011-12-02 DIAGNOSIS — I509 Heart failure, unspecified: Secondary | ICD-10-CM | POA: Insufficient documentation

## 2011-12-02 DIAGNOSIS — Z87898 Personal history of other specified conditions: Secondary | ICD-10-CM | POA: Insufficient documentation

## 2011-12-02 DIAGNOSIS — Z85820 Personal history of malignant melanoma of skin: Secondary | ICD-10-CM | POA: Insufficient documentation

## 2011-12-02 DIAGNOSIS — Z8739 Personal history of other diseases of the musculoskeletal system and connective tissue: Secondary | ICD-10-CM | POA: Insufficient documentation

## 2011-12-02 DIAGNOSIS — Z95810 Presence of automatic (implantable) cardiac defibrillator: Secondary | ICD-10-CM | POA: Insufficient documentation

## 2011-12-02 DIAGNOSIS — Z8701 Personal history of pneumonia (recurrent): Secondary | ICD-10-CM | POA: Insufficient documentation

## 2011-12-02 DIAGNOSIS — Z90711 Acquired absence of uterus with remaining cervical stump: Secondary | ICD-10-CM | POA: Insufficient documentation

## 2011-12-02 DIAGNOSIS — E039 Hypothyroidism, unspecified: Secondary | ICD-10-CM | POA: Insufficient documentation

## 2011-12-02 DIAGNOSIS — Z8659 Personal history of other mental and behavioral disorders: Secondary | ICD-10-CM | POA: Insufficient documentation

## 2011-12-02 DIAGNOSIS — Z8709 Personal history of other diseases of the respiratory system: Secondary | ICD-10-CM | POA: Insufficient documentation

## 2011-12-02 DIAGNOSIS — Z79899 Other long term (current) drug therapy: Secondary | ICD-10-CM | POA: Insufficient documentation

## 2011-12-02 DIAGNOSIS — M4120 Other idiopathic scoliosis, site unspecified: Secondary | ICD-10-CM | POA: Insufficient documentation

## 2011-12-02 DIAGNOSIS — Z9089 Acquired absence of other organs: Secondary | ICD-10-CM | POA: Insufficient documentation

## 2011-12-02 DIAGNOSIS — I4891 Unspecified atrial fibrillation: Secondary | ICD-10-CM | POA: Insufficient documentation

## 2011-12-02 DIAGNOSIS — R51 Headache: Secondary | ICD-10-CM

## 2011-12-02 DIAGNOSIS — Z8679 Personal history of other diseases of the circulatory system: Secondary | ICD-10-CM | POA: Insufficient documentation

## 2011-12-02 MED ORDER — SODIUM CHLORIDE 0.9 % IV BOLUS (SEPSIS)
1000.0000 mL | Freq: Once | INTRAVENOUS | Status: AC
  Administered 2011-12-02: 1000 mL via INTRAVENOUS

## 2011-12-02 MED ORDER — TRAMADOL HCL 50 MG PO TABS: 50.0000 mg | ORAL_TABLET | Freq: Four times a day (QID) | ORAL | Status: DC | PRN

## 2011-12-02 MED ORDER — HYDROMORPHONE HCL PF 1 MG/ML IJ SOLN
1.0000 mg | Freq: Once | INTRAMUSCULAR | Status: AC
  Administered 2011-12-02: 1 mg via INTRAVENOUS
  Filled 2011-12-02: qty 1

## 2011-12-02 MED ORDER — KETOROLAC TROMETHAMINE 30 MG/ML IJ SOLN
30.0000 mg | Freq: Once | INTRAMUSCULAR | Status: AC
  Administered 2011-12-02: 30 mg via INTRAVENOUS
  Filled 2011-12-02: qty 1

## 2011-12-02 MED ORDER — METOCLOPRAMIDE HCL 5 MG/ML IJ SOLN
10.0000 mg | Freq: Once | INTRAMUSCULAR | Status: AC
  Administered 2011-12-02: 10 mg via INTRAVENOUS
  Filled 2011-12-02: qty 2

## 2011-12-02 NOTE — ED Provider Notes (Signed)
History     CSN: [id number]  Arrival date & time 12/02/11  1301   First MD Initiated Contact with Patient 12/02/11 1349      Chief Complaint  Patient presents with  . Migraine    (Consider location/radiation/quality/duration/timing/severity/associated sxs/prior treatment) HPI Comments: Patient with history of migraine headaches.  She was here yesterday for the same and given ivf's and meds.  She was feeling better at the time of discharge.  The headache recurred this morning.  She now reports she is feeling numbness of the upper lip.  No visual disturbances.  No fevers or chills.  No stiff neck.    Patient is a 56 y.o. female presenting with migraines. The history is provided by the patient.  Migraine This is a recurrent problem. The problem occurs constantly. The problem has been rapidly worsening. Exacerbated by: light, motion, noise. Nothing relieves the symptoms. Treatments tried: relpax. The treatment provided no relief.    Past Medical History  Diagnosis Date  . Pleural effusion, right     thoracentesis 02/09/09   . CHF (congestive heart failure)     Dr. Donnie Aho  . Cardiomyopathy   . Hypertension     essential- benign  . Aortic valve disorder   . LBBB (left bundle branch block)     conduction disorder  . Migraines   . Osteoporosis   . Hypothyroidism   . Anxiety 01/20/11    "in the past; not now"  . Raynaud's disease   . Fibroids   . Scoliosis   . AF (paroxysmal atrial fibrillation)   . H/O: hysterectomy     secondary to uterine prolapse  . Hyperthyroidism   . ICD (implantable cardiac defibrillator) in place   . Pneumonia   . GERD (gastroesophageal reflux disease)     otc  . 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 Complication of anesthesia     aspiration  . PONV (postoperative nausea and vomiting)   . Family history of anesthesia complication     nausea  . Shortness of breath   . Arthritis     Past Surgical History  Procedure  Date  . Vesicovaginal fistula closure w/ tah   . Thyroid lobectomy ~ 2006    right  . Rotator cuff repair ~ 2009    left  . Cardiac defibrillator placement 01/20/11  . Cholecystectomy ~1996  . Tubal ligation ~ 1983  . Vaginal hysterectomy ~ 2009  . Back surgery 12/31/2009    "for flat back syndrome; fused bottom of my back"  . Foot neuroma surgery ~ 2003    right  . Skin cancer excision     nose, forehead, left leg  . Scoliosis surgery   . Shoulder arthroscopy w/ rotator cuff repair 09/21/2011    Family History  Problem Relation Age of Onset  . Heart disease Mother   . Heart disease Sister   . Heart disease Father 67  . Macular degeneration Father   . Heart failure Mother 76    CHF  . Stroke Mother   . Hypertension Mother   . Atrial fibrillation Sister   . Heart disease Sister     History  Substance Use Topics  . Smoking status: Never Smoker   . Smokeless tobacco: Never Used  . Alcohol Use: No    OB History    Grav Para Term Preterm Abortions TAB SAB Ect Mult Living  Review of Systems  All other systems reviewed and are negative.    Allergies  Codeine; Fentanyl; Methocarbamol; and Morphine and related  Home Medications   Current Outpatient Rx  Name  Route  Sig  Dispense  Refill  . ACETAMINOPHEN 500 MG PO TABS   Oral   Take 1,000 mg by mouth 2 (two) times daily.          . AMOXICILLIN-POT CLAVULANATE 875-125 MG PO TABS   Oral   Take 1 tablet by mouth every 12 (twelve) hours.   14 tablet   0   . CARVEDILOL 12.5 MG PO TABS   Oral   Take 12.5 mg by mouth 2 (two) times daily with a meal.           . ELETRIPTAN HYDROBROMIDE 40 MG PO TABS   Oral   One tablet by mouth as needed for migraine headache.  If the headache improves and then returns, dose may be repeated after 2 hours have elapsed since first dose (do not exceed 80 mg per day). For migraine. may repeat in 2 hours if necessary          . FEXOFENADINE HCL 180 MG PO  TABS   Oral   Take 180 mg by mouth daily.         Marland Kitchen FLUTICASONE PROPIONATE 50 MCG/ACT NA SUSP   Nasal   Place 1 spray into the nose 2 (two) times daily.           . FUROSEMIDE 40 MG PO TABS   Oral   Take 20 mg by mouth daily.         . IMIPRAMINE HCL 50 MG PO TABS   Oral   Take 100 mg by mouth at bedtime.          Marland Kitchen LEVOTHYROXINE SODIUM 125 MCG PO TABS   Oral   Take 125 mcg by mouth daily. 1 tablet daily         . LISINOPRIL 2.5 MG PO TABS   Oral   Take 2.5 mg by mouth daily.           Marland Kitchen POLYETHYLENE GLYCOL 3350 PO PACK   Oral   Take 17 g by mouth daily as needed. For constipation.          . SPIRONOLACTONE 25 MG PO TABS   Oral   Take 25 mg by mouth daily. 1 tablet daily           BP 112/51  Pulse 76  Temp 97.7 F (36.5 C) (Oral)  SpO2 100%  Physical Exam  Nursing note and vitals reviewed. Constitutional: She is oriented to person, place, and time. She appears well-developed and well-nourished. No distress.  HENT:  Head: Normocephalic and atraumatic.  Eyes: EOM are normal. Pupils are equal, round, and reactive to light.       Fundoscopic exam is within normal limits.  There is no papilledema.  Neck: Normal range of motion. Neck supple.  Cardiovascular: Normal rate and regular rhythm.  Exam reveals no gallop and no friction rub.   No murmur heard. Pulmonary/Chest: Effort normal and breath sounds normal. No respiratory distress. She has no wheezes.  Abdominal: Soft. Bowel sounds are normal. She exhibits no distension. There is no tenderness.  Musculoskeletal: Normal range of motion.  Neurological: She is alert and oriented to person, place, and time. No cranial nerve deficit. She exhibits normal muscle tone. Coordination normal.  Skin: Skin is warm and dry. She  is not diaphoretic.    ED Course  Procedures (including critical care time)  Labs Reviewed - No data to display No results found.   No diagnosis found.    MDM  Patient feels  better with meds given.  Her ct is negative for bleed.  There is evidence of chronic sphenoid sinusitis for which the amox may help in the upcoming days.  Will prescribe tramadol due to her multiple allergies.  Follow up prn if she worsens.        Geoffery Lyons, MD 12/02/11 1505

## 2011-12-02 NOTE — ED Notes (Signed)
MD at bedside. 

## 2011-12-02 NOTE — ED Notes (Signed)
Pt. Was seen yesterday for same but has multiple complaints today of numbness in the forehead region and her R nostril as well as the upper lip area.  No noted neuro deficit.

## 2011-12-08 ENCOUNTER — Emergency Department (HOSPITAL_COMMUNITY)
Admission: EM | Admit: 2011-12-08 | Discharge: 2011-12-08 | Disposition: A | Discharge status: Home / Self Care | Payer: BC Managed Care – PPO | Attending: Emergency Medicine | Admitting: Emergency Medicine

## 2011-12-08 ENCOUNTER — Encounter (HOSPITAL_COMMUNITY): Payer: Self-pay

## 2011-12-08 DIAGNOSIS — G43909 Migraine, unspecified, not intractable, without status migrainosus: Secondary | ICD-10-CM | POA: Insufficient documentation

## 2011-12-08 DIAGNOSIS — K219 Gastro-esophageal reflux disease without esophagitis: Secondary | ICD-10-CM | POA: Insufficient documentation

## 2011-12-08 DIAGNOSIS — I447 Left bundle-branch block, unspecified: Secondary | ICD-10-CM | POA: Insufficient documentation

## 2011-12-08 DIAGNOSIS — Z8659 Personal history of other mental and behavioral disorders: Secondary | ICD-10-CM | POA: Insufficient documentation

## 2011-12-08 DIAGNOSIS — Z8679 Personal history of other diseases of the circulatory system: Secondary | ICD-10-CM | POA: Insufficient documentation

## 2011-12-08 DIAGNOSIS — I428 Other cardiomyopathies: Secondary | ICD-10-CM | POA: Insufficient documentation

## 2011-12-08 DIAGNOSIS — I7300 Raynaud's syndrome without gangrene: Secondary | ICD-10-CM | POA: Insufficient documentation

## 2011-12-08 DIAGNOSIS — E0590 Thyrotoxicosis, unspecified without thyrotoxic crisis or storm: Secondary | ICD-10-CM | POA: Insufficient documentation

## 2011-12-08 DIAGNOSIS — E039 Hypothyroidism, unspecified: Secondary | ICD-10-CM | POA: Insufficient documentation

## 2011-12-08 DIAGNOSIS — Z90710 Acquired absence of both cervix and uterus: Secondary | ICD-10-CM | POA: Insufficient documentation

## 2011-12-08 DIAGNOSIS — Z8739 Personal history of other diseases of the musculoskeletal system and connective tissue: Secondary | ICD-10-CM | POA: Insufficient documentation

## 2011-12-08 DIAGNOSIS — Z8742 Personal history of other diseases of the female genital tract: Secondary | ICD-10-CM | POA: Insufficient documentation

## 2011-12-08 DIAGNOSIS — I509 Heart failure, unspecified: Secondary | ICD-10-CM | POA: Insufficient documentation

## 2011-12-08 DIAGNOSIS — M810 Age-related osteoporosis without current pathological fracture: Secondary | ICD-10-CM | POA: Insufficient documentation

## 2011-12-08 DIAGNOSIS — Z95810 Presence of automatic (implantable) cardiac defibrillator: Secondary | ICD-10-CM | POA: Insufficient documentation

## 2011-12-08 DIAGNOSIS — H53149 Visual discomfort, unspecified: Secondary | ICD-10-CM | POA: Insufficient documentation

## 2011-12-08 DIAGNOSIS — Z85828 Personal history of other malignant neoplasm of skin: Secondary | ICD-10-CM | POA: Insufficient documentation

## 2011-12-08 DIAGNOSIS — I10 Essential (primary) hypertension: Secondary | ICD-10-CM | POA: Insufficient documentation

## 2011-12-08 DIAGNOSIS — Z8701 Personal history of pneumonia (recurrent): Secondary | ICD-10-CM | POA: Insufficient documentation

## 2011-12-08 DIAGNOSIS — I359 Nonrheumatic aortic valve disorder, unspecified: Secondary | ICD-10-CM | POA: Insufficient documentation

## 2011-12-08 DIAGNOSIS — R5381 Other malaise: Secondary | ICD-10-CM | POA: Insufficient documentation

## 2011-12-08 DIAGNOSIS — R5383 Other fatigue: Secondary | ICD-10-CM | POA: Insufficient documentation

## 2011-12-08 DIAGNOSIS — M4120 Other idiopathic scoliosis, site unspecified: Secondary | ICD-10-CM | POA: Insufficient documentation

## 2011-12-08 DIAGNOSIS — Z8709 Personal history of other diseases of the respiratory system: Secondary | ICD-10-CM | POA: Insufficient documentation

## 2011-12-08 DIAGNOSIS — R112 Nausea with vomiting, unspecified: Secondary | ICD-10-CM | POA: Insufficient documentation

## 2011-12-08 DIAGNOSIS — Z79899 Other long term (current) drug therapy: Secondary | ICD-10-CM | POA: Insufficient documentation

## 2011-12-08 MED ORDER — KETOROLAC TROMETHAMINE 30 MG/ML IJ SOLN
30.0000 mg | Freq: Once | INTRAMUSCULAR | Status: AC
  Administered 2011-12-08: 30 mg via INTRAVENOUS
  Filled 2011-12-08: qty 1

## 2011-12-08 MED ORDER — DIPHENHYDRAMINE HCL 50 MG/ML IJ SOLN
25.0000 mg | Freq: Once | INTRAMUSCULAR | Status: AC
  Administered 2011-12-08: 25 mg via INTRAVENOUS
  Filled 2011-12-08: qty 1

## 2011-12-08 MED ORDER — METOCLOPRAMIDE HCL 5 MG/ML IJ SOLN
10.0000 mg | Freq: Once | INTRAMUSCULAR | Status: AC
  Administered 2011-12-08: 10 mg via INTRAVENOUS
  Filled 2011-12-08: qty 2

## 2011-12-08 MED ORDER — ONDANSETRON HCL 4 MG PO TABS: 4.0000 mg | ORAL_TABLET | Freq: Four times a day (QID) | ORAL | Status: DC

## 2011-12-08 NOTE — ED Notes (Signed)
Per report from [facility] pt arrived from home with a cc of headache, eye pain, nausea, and vomiting.  She has been seen by her primary MD this week and was diagnosed with trigeminal neuralgia.  Pt is alert and oriented no distress noted.  IV NS with a 300 ml bolus and Zofran 4 mg IV was administered.  The HA symptoms have been present x 6 Days.

## 2011-12-08 NOTE — ED Provider Notes (Signed)
History     CSN: [id number]  Arrival date & time 12/08/11  4098   First MD Initiated Contact with Patient 12/08/11 1047      Chief Complaint  Patient presents with  . Headache    (Consider location/radiation/quality/duration/timing/severity/associated sxs/prior treatment) Patient is a 56 y.o. female presenting with headaches. The history is provided by the patient, the EMS personnel, a relative, the spouse and medical records.  Headache  This is a recurrent problem. The current episode started more than 2 days ago. The problem occurs constantly. The problem has been gradually worsening. The headache is associated with bright light and activity. The pain is at a severity of 8/10. The pain is moderate. Associated symptoms include nausea and vomiting. Pertinent negatives include no fever, no malaise/fatigue, no near-syncope and no shortness of breath. Treatments tried: tegretol, replax.  56yo female with c/o chronic migraine intermittant x 6 days with sinus infection per recent CT head.  Photophobia and nausea.  She is taking augmentin for sinus infection.    Past Medical History  Diagnosis Date  . Pleural effusion, right     thoracentesis 02/09/09   . CHF (congestive heart failure)     Dr. Donnie Aho  . Cardiomyopathy   . Hypertension     essential- benign  . Aortic valve disorder   . LBBB (left bundle branch block)     conduction disorder  . Migraines   . Osteoporosis   . Hypothyroidism   . Anxiety 01/20/11    "in the past; not now"  . Raynaud's disease   . Fibroids   . Scoliosis   . AF (paroxysmal atrial fibrillation)   . H/O: hysterectomy     secondary to uterine prolapse  . Hyperthyroidism   . ICD (implantable cardiac defibrillator) in place   . Pneumonia   . GERD (gastroesophageal reflux disease)     otc  . 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 Complication of anesthesia     aspiration  . PONV (postoperative nausea and vomiting)   . Family  history of anesthesia complication     nausea  . Shortness of breath   . Arthritis     Past Surgical History  Procedure Date  . Vesicovaginal fistula closure w/ tah   . Thyroid lobectomy ~ 2006    right  . Rotator cuff repair ~ 2009    left  . Cardiac defibrillator placement 01/20/11  . Cholecystectomy ~1996  . Tubal ligation ~ 1983  . Vaginal hysterectomy ~ 2009  . Back surgery 12/31/2009    "for flat back syndrome; fused bottom of my back"  . Foot neuroma surgery ~ 2003    right  . Skin cancer excision     nose, forehead, left leg  . Scoliosis surgery   . Shoulder arthroscopy w/ rotator cuff repair 09/21/2011    Family History  Problem Relation Age of Onset  . Heart disease Mother   . Heart disease Sister   . Heart disease Father 34  . Macular degeneration Father   . Heart failure Mother 55    CHF  . Stroke Mother   . Hypertension Mother   . Atrial fibrillation Sister   . Heart disease Sister     History  Substance Use Topics  . Smoking status: Never Smoker   . Smokeless tobacco: Never Used  . Alcohol Use: No    OB History    Grav Para Term Preterm  Abortions TAB SAB Ect Mult Living                  Review of Systems  Constitutional: Negative for fever and malaise/fatigue.  HENT: Negative for neck pain and neck stiffness.   Eyes: Positive for photophobia and pain. Negative for visual disturbance.  Respiratory: Negative.  Negative for shortness of breath.   Cardiovascular: Negative.  Negative for chest pain and near-syncope.  Gastrointestinal: Positive for nausea and vomiting.  Musculoskeletal: Negative.   Skin: Negative.   Neurological: Positive for weakness and headaches. Negative for dizziness, facial asymmetry, speech difficulty and light-headedness.  Psychiatric/Behavioral: Negative.   All other systems reviewed and are negative.    Allergies  Codeine; Fentanyl; Methocarbamol; and Morphine and related  Home Medications   Current Outpatient  Rx  Name  Route  Sig  Dispense  Refill  . AMOXICILLIN-POT CLAVULANATE 875-125 MG PO TABS   Oral   Take 1 tablet by mouth every 12 (twelve) hours.   14 tablet   0   . ANTIPYRINE-BENZOCAINE 5.4-1.4 % OT SOLN   Otic   Place 3 drops in ear(s) every 2 (two) hours as needed. For pain         . CARBAMAZEPINE 100 MG PO CHEW   Oral   Chew 100 mg by mouth 2 (two) times daily.         Marland Kitchen CARVEDILOL 12.5 MG PO TABS   Oral   Take 12.5 mg by mouth 2 (two) times daily with a meal.           . ELETRIPTAN HYDROBROMIDE 40 MG PO TABS   Oral   One tablet by mouth as needed for migraine headache.  If the headache improves and then returns, dose may be repeated after 2 hours have elapsed since first dose (do not exceed 80 mg per day). For migraine. may repeat in 2 hours if necessary          . FEXOFENADINE HCL 180 MG PO TABS   Oral   Take 180 mg by mouth daily.         Marland Kitchen FLUTICASONE PROPIONATE 50 MCG/ACT NA SUSP   Nasal   Place 1 spray into the nose 2 (two) times daily.           . FUROSEMIDE 40 MG PO TABS   Oral   Take 20 mg by mouth daily.         . IMIPRAMINE HCL 50 MG PO TABS   Oral   Take 100 mg by mouth at bedtime.          Marland Kitchen LEVOTHYROXINE SODIUM 125 MCG PO TABS   Oral   Take 125 mcg by mouth daily. 1 tablet daily         . LISINOPRIL 2.5 MG PO TABS   Oral   Take 2.5 mg by mouth daily.           Marland Kitchen OVER THE COUNTER MEDICATION   Oral   Take 1 tablet by mouth every 8 (eight) hours as needed. For pain -  Tylenol         . POLYETHYLENE GLYCOL 3350 PO PACK   Oral   Take 17 g by mouth daily as needed. For constipation.          Marland Kitchen PREDNISONE 10 MG PO TABS   Oral   Take 10 mg by mouth See admin instructions. Taper dose starting on 12/03/11         .  SPIRONOLACTONE 25 MG PO TABS   Oral   Take 25 mg by mouth daily.          . TRAMADOL HCL 50 MG PO TABS   Oral   Take 1 tablet (50 mg total) by mouth every 6 (six) hours as needed for pain.   15 tablet    0   . ONDANSETRON HCL 4 MG PO TABS   Oral   Take 1 tablet (4 mg total) by mouth every 6 (six) hours.   12 tablet   0     BP 142/78  Pulse 61  Temp 97.8 F (36.6 C) (Oral)  Resp 18  SpO2 99%  Physical Exam  Neurological: She is alert. She has normal strength. No cranial nerve deficit or sensory deficit. She displays a negative Romberg sign. GCS eye subscore is 4. GCS verbal subscore is 5. GCS motor subscore is 6.       Neuro in tact.      ED Course  Procedures (including critical care time)  Labs Reviewed - No data to display No results found.   1. Migraine       MDM  Migraine headache treated in the ER with iv fluids and migraine cocktail with relief.  Continue augmentin and she understands to return for worsening symptoms.        Remi Haggard, NP 12/09/11 1821

## 2011-12-09 NOTE — ED Provider Notes (Signed)
Medical screening examination/treatment/procedure(s) were performed by non-physician practitioner and as supervising physician I was immediately available for consultation/collaboration.   Gavin Pound. Buena Boehm, MD 12/09/11 2311

## 2011-12-23 ENCOUNTER — Encounter (INDEPENDENT_AMBULATORY_CARE_PROVIDER_SITE_OTHER): Payer: Self-pay | Admitting: General Surgery

## 2011-12-23 ENCOUNTER — Ambulatory Visit (INDEPENDENT_AMBULATORY_CARE_PROVIDER_SITE_OTHER): Payer: BC Managed Care – PPO | Admitting: General Surgery

## 2011-12-23 VITALS — BP 128/72 | HR 88 | Temp 97.0°F | Resp 18 | Ht 67.5 in | Wt 160.0 lb

## 2011-12-23 DIAGNOSIS — R51 Headache: Secondary | ICD-10-CM

## 2011-12-23 MED ORDER — ALPRAZOLAM 1 MG PO TABS: 1.0000 mg | ORAL_TABLET | Freq: Once | ORAL | Status: DC

## 2011-12-23 MED ORDER — TRAMADOL HCL 50 MG PO TABS
50.0000 mg | ORAL_TABLET | Freq: Four times a day (QID) | ORAL | Status: DC | PRN
Start: 2011-12-23 — End: 2012-01-09

## 2011-12-23 NOTE — Progress Notes (Signed)
Chief complaint: Right-sided headache, rule out temporal arteritis  History: Patient is a 56-year-old female referred by Dr. Penumalli for consideration for temporal artery biopsy. She gives an approximately six-week history of persistent severe right-sided headache which has been essentially constant. Only partially controlled with medications. She has some numbness over the right side of her face as well but no other visual or neurological symptoms. She's had a negative CT scan of the head. After evaluation by her neurologist temporal artery biopsy is requested to rule out arteritis.  Past Medical History  Diagnosis Date  . Pleural effusion, right     thoracentesis 02/09/09   . CHF (congestive heart failure)     Dr. Tilley  . Cardiomyopathy   . Hypertension     essential- benign  . Aortic valve disorder   . LBBB (left bundle branch block)     conduction disorder  . Migraines   . Osteoporosis   . Hypothyroidism   . Anxiety 01/20/11    "in the past; not now"  . Raynaud's disease   . Fibroids   . Scoliosis   . AF (paroxysmal atrial fibrillation)   . H/O: hysterectomy     secondary to uterine prolapse  . Hyperthyroidism   . ICD (implantable cardiac defibrillator) in place   . Pneumonia   . GERD (gastroesophageal reflux disease)     otc  . 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 Complication of anesthesia     aspiration  . PONV (postoperative nausea and vomiting)   . Family history of anesthesia complication     nausea  . Shortness of breath   . Arthritis    Past Surgical History  Procedure Date  . Vesicovaginal fistula closure w/ tah   . Thyroid lobectomy ~ 2006    right  . Rotator cuff repair ~ 2009    left  . Cardiac defibrillator placement 01/20/11  . Cholecystectomy ~1996  . Tubal ligation ~ 1983  . Vaginal hysterectomy ~ 2009  . Back surgery 12/31/2009    "for flat back syndrome; fused bottom of my back"  . Foot neuroma surgery ~ 2003    right  . Skin cancer excision     nose, forehead, left leg  . Scoliosis surgery   . Shoulder arthroscopy w/ rotator cuff repair 09/21/2011   Current Outpatient Prescriptions  Medication Sig Dispense Refill  . amoxicillin-clavulanate (AUGMENTIN) 875-125 MG per tablet Take 1 tablet by mouth every 12 (twelve) hours.  14 tablet  0  . antipyrine-benzocaine (AURALGAN) otic solution Place 3 drops in ear(s) every 2 (two) hours as needed. For pain      . carvedilol (COREG) 12.5 MG tablet Take 12.5 mg by mouth 2 (two) times daily with a meal.        . eletriptan (RELPAX) 40 MG tablet One tablet by mouth as needed for migraine headache.  If the headache improves and then returns, dose may be repeated after 2 hours have elapsed since first dose (do not exceed 80 mg per day). For migraine. may repeat in 2 hours if necessary       . fexofenadine (ALLEGRA) 180 MG tablet Take 180 mg by mouth daily.      . fluticasone (FLONASE) 50 MCG/ACT nasal spray Place 1 spray into the nose 2 (two) times daily.        . furosemide (LASIX) 40 MG tablet Take 20 mg by mouth daily.      .   gabapentin (NEURONTIN) 600 MG tablet       . imipramine (TOFRANIL) 50 MG tablet Take 100 mg by mouth at bedtime.       . levothyroxine (SYNTHROID, LEVOTHROID) 125 MCG tablet Take 125 mcg by mouth daily. 1 tablet daily      . magnesium hydroxide (PHILLIPS CHEWS) 311 MG CHEW Chew 311 mg by mouth every 4 (four) hours as needed.      . ondansetron (ZOFRAN) 4 MG tablet Take 1 tablet (4 mg total) by mouth every 6 (six) hours.  12 tablet  0  . OVER THE COUNTER MEDICATION Take 1 tablet by mouth every 8 (eight) hours as needed. For pain -  Tylenol      . spironolactone (ALDACTONE) 25 MG tablet Take 25 mg by mouth daily.       . traMADol (ULTRAM) 50 MG tablet Take 1 tablet (50 mg total) by mouth every 6 (six) hours as needed for pain.  15 tablet  0  . carbamazepine (TEGRETOL) 100 MG chewable tablet Chew 100 mg by mouth 2 (two) times daily.      .  lisinopril (PRINIVIL,ZESTRIL) 2.5 MG tablet Take 2.5 mg by mouth daily.        . polyethylene glycol (MIRALAX / GLYCOLAX) packet Take 17 g by mouth daily as needed. For constipation.       . predniSONE (DELTASONE) 10 MG tablet Take 10 mg by mouth See admin instructions. Taper dose starting on 12/03/11       Allergies  Allergen Reactions  . Codeine Other (See Comments)    Chest pain  . Fentanyl Other (See Comments)    Bad dreams  . Methocarbamol Other (See Comments)    Chest pain.  . Morphine And Related    Exam: BP 128/72  Pulse 88  Temp 97 F (36.1 C) (Temporal)  Resp 18  Ht 5' 7.5" (1.715 m)  Wt 160 lb (72.576 kg)  BMI 24.69 kg/m2 General: Well-developed female who is clearly uncomfortable HEENT: There is mild tenderness over the right temporal area. Right temporal artery has a palpable pulse.  Assessment and plan: Persistent right-sided headaches, rule out temple arteritis. We discussed temporal artery biopsy under local anesthesia. The indications for the procedure, its nature in recovery, and risks of bleeding and infection were discussed and understood. We will schedule this within the next few days as an outpatient. 

## 2011-12-29 ENCOUNTER — Ambulatory Visit (HOSPITAL_BASED_OUTPATIENT_CLINIC_OR_DEPARTMENT_OTHER)
Admission: RE | Admit: 2011-12-29 | Discharge: 2011-12-29 | Disposition: A | Discharge status: Home / Self Care | Payer: BC Managed Care – PPO | Source: Ambulatory Visit | Attending: General Surgery | Admitting: General Surgery

## 2011-12-29 ENCOUNTER — Encounter (HOSPITAL_BASED_OUTPATIENT_CLINIC_OR_DEPARTMENT_OTHER)
Admission: RE | Disposition: A | Discharge status: Home / Self Care | Payer: Self-pay | Source: Ambulatory Visit | Attending: General Surgery

## 2011-12-29 DIAGNOSIS — Z8701 Personal history of pneumonia (recurrent): Secondary | ICD-10-CM | POA: Insufficient documentation

## 2011-12-29 DIAGNOSIS — I4891 Unspecified atrial fibrillation: Secondary | ICD-10-CM | POA: Insufficient documentation

## 2011-12-29 DIAGNOSIS — Z885 Allergy status to narcotic agent status: Secondary | ICD-10-CM | POA: Insufficient documentation

## 2011-12-29 DIAGNOSIS — K219 Gastro-esophageal reflux disease without esophagitis: Secondary | ICD-10-CM | POA: Insufficient documentation

## 2011-12-29 DIAGNOSIS — R51 Headache: Secondary | ICD-10-CM

## 2011-12-29 DIAGNOSIS — G43909 Migraine, unspecified, not intractable, without status migrainosus: Secondary | ICD-10-CM | POA: Insufficient documentation

## 2011-12-29 DIAGNOSIS — I509 Heart failure, unspecified: Secondary | ICD-10-CM | POA: Insufficient documentation

## 2011-12-29 DIAGNOSIS — M4120 Other idiopathic scoliosis, site unspecified: Secondary | ICD-10-CM | POA: Insufficient documentation

## 2011-12-29 DIAGNOSIS — Z85828 Personal history of other malignant neoplasm of skin: Secondary | ICD-10-CM | POA: Insufficient documentation

## 2011-12-29 DIAGNOSIS — I7300 Raynaud's syndrome without gangrene: Secondary | ICD-10-CM | POA: Insufficient documentation

## 2011-12-29 DIAGNOSIS — I447 Left bundle-branch block, unspecified: Secondary | ICD-10-CM | POA: Insufficient documentation

## 2011-12-29 DIAGNOSIS — M810 Age-related osteoporosis without current pathological fracture: Secondary | ICD-10-CM | POA: Insufficient documentation

## 2011-12-29 DIAGNOSIS — I10 Essential (primary) hypertension: Secondary | ICD-10-CM | POA: Insufficient documentation

## 2011-12-29 DIAGNOSIS — I428 Other cardiomyopathies: Secondary | ICD-10-CM | POA: Insufficient documentation

## 2011-12-29 DIAGNOSIS — E039 Hypothyroidism, unspecified: Secondary | ICD-10-CM | POA: Insufficient documentation

## 2011-12-29 HISTORY — PX: ARTERY BIOPSY: SHX891

## 2011-12-29 SURGERY — MINOR BIOPSY TEMPORAL ARTERY
Anesthesia: LOCAL | Site: Face | Laterality: Right | Wound class: Clean

## 2011-12-29 MED ORDER — LIDOCAINE-EPINEPHRINE 1 %-1:100000 IJ SOLN
INTRAMUSCULAR | Status: DC | PRN
  Administered 2011-12-29: 4 mL

## 2011-12-29 SURGICAL SUPPLY — 39 items
BENZOIN TINCTURE PRP APPL 2/3 (GAUZE/BANDAGES/DRESSINGS) IMPLANT
BLADE SURG 15 STRL LF DISP TIS (BLADE) ×1 IMPLANT
BLADE SURG 15 STRL SS (BLADE) ×1
BLADE SURG ROTATE 9660 (MISCELLANEOUS) ×2 IMPLANT
CLOTH BEACON ORANGE TIMEOUT ST (SAFETY) ×2 IMPLANT
DERMABOND ADVANCED (GAUZE/BANDAGES/DRESSINGS)
DERMABOND ADVANCED .7 DNX12 (GAUZE/BANDAGES/DRESSINGS) IMPLANT
DRSG TEGADERM 4X4.75 (GAUZE/BANDAGES/DRESSINGS) IMPLANT
ELECT REM PT RETURN 9FT ADLT (ELECTROSURGICAL)
ELECTRODE REM PT RTRN 9FT ADLT (ELECTROSURGICAL) IMPLANT
GAUZE SPONGE 4X4 12PLY STRL LF (GAUZE/BANDAGES/DRESSINGS) IMPLANT
GAUZE SPONGE 4X4 16PLY XRAY LF (GAUZE/BANDAGES/DRESSINGS) IMPLANT
GLOVE EUDERMIC 7 POWDERFREE (GLOVE) IMPLANT
GLOVE SKINSENSE NS SZ7.5 (GLOVE) ×1
GLOVE SKINSENSE STRL SZ7.5 (GLOVE) ×1 IMPLANT
MARKER SKIN DUAL TIP RULER LAB (MISCELLANEOUS) ×2 IMPLANT
NDL SAFETY ECLIPSE 18X1.5 (NEEDLE) ×1 IMPLANT
NEEDLE HYPO 18GX1.5 SHARP (NEEDLE) ×1
NEEDLE HYPO 25X1 1.5 SAFETY (NEEDLE) IMPLANT
NEEDLE HYPO 30GX1 BEV (NEEDLE) ×2 IMPLANT
PENCIL BUTTON HOLSTER BLD 10FT (ELECTRODE) IMPLANT
STRIP CLOSURE SKIN 1/2X4 (GAUZE/BANDAGES/DRESSINGS) IMPLANT
SUT ETHILON 3 0 PS 1 (SUTURE) IMPLANT
SUT ETHILON 4 0 PS 2 18 (SUTURE) IMPLANT
SUT ETHILON 6 0 P 1 (SUTURE) ×2 IMPLANT
SUT MNCRL AB 4-0 PS2 18 (SUTURE) IMPLANT
SUT PROLENE 3 0 PS 2 (SUTURE) IMPLANT
SUT PROLENE 5 0 P 3 (SUTURE) IMPLANT
SUT SILK 4 0 TIES 17X18 (SUTURE) ×2 IMPLANT
SUT VIC AB 3-0 FS2 27 (SUTURE) IMPLANT
SUT VIC AB 4-0 BRD 54 (SUTURE) IMPLANT
SUT VIC AB 4-0 P-3 18XBRD (SUTURE) IMPLANT
SUT VIC AB 4-0 P3 18 (SUTURE)
SUT VIC AB 4-0 SH 18 (SUTURE) IMPLANT
SUT VIC AB 5-0 P-3 18X BRD (SUTURE) ×1 IMPLANT
SUT VIC AB 5-0 P3 18 (SUTURE) ×1
SWABSTICK POVIDONE IODINE SNGL (MISCELLANEOUS) ×4 IMPLANT
SYR CONTROL 10ML LL (SYRINGE) ×2 IMPLANT
TOWEL OR 17X24 6PK STRL BLUE (TOWEL DISPOSABLE) IMPLANT

## 2011-12-29 NOTE — Op Note (Signed)
Preoperative Diagnosis: r/o temoral  ateritis  Postoprative Diagnosis: r/o temoral  arteritis  Procedure: Procedure(s): MINOR BIOPSY TEMPORAL ARTERY   Surgeon: Glenna Fellows T   Assistants: None  Anesthesia:  Local anesthesia 1% buffered lidocaine, with epinephrine  Indications:   Patient is a 56 year old female who presents with persistent right-sided headaches, vision changes and elevated ESR. Her neurologist has requested a temporal artery biopsy to assist in diagnosis. I discussed the indications for the procedure and its nature and risks with the patient detailed elsewhere and she has elected proceed  Procedure Detail:  Patient is brought to the operating room and placed in the supine position on the operating table. The right temple was widely sterilely prepped and draped. The soft tissue just anterior to the right ear over the palpable pulse was thoroughly anesthetized with lidocaine with epinephrine. I made an incision in the skin crease in front of the ear extending slightly up onto the temple. Dissection was carried into the subcutaneous tissue and dissected down onto a small artery. This was then completely dissected free over about 2-2-1/2 cm and clamped proximally distally and about a 2 cm segment excised. Cut ends were tied with 4-0 nylon. The subcutaneous was closed with interrupted 5-0 Vicryl and the skin with a running 6-0 nylon. There was complete hemostasis the patient tolerated the procedure well.   Specimens: Segment right temporal artery        Complications:  * No complications entered in OR log *         Mariella Saa MD, FACS  12/29/2011, 8:35 AM

## 2011-12-29 NOTE — H&P (View-Only) (Signed)
Chief complaint: Right-sided headache, rule out temporal arteritis  History: Patient is a 56 year old female referred by Dr. Marjory Lies for consideration for temporal artery biopsy. She gives an approximately six-week history of persistent severe right-sided headache which has been essentially constant. Only partially controlled with medications. She has some numbness over the right side of her face as well but no other visual or neurological symptoms. She's had a negative CT scan of the head. After evaluation by her neurologist temporal artery biopsy is requested to rule out arteritis.  Past Medical History  Diagnosis Date  . Pleural effusion, right     thoracentesis 02/09/09   . CHF (congestive heart failure)     Dr. Donnie Aho  . Cardiomyopathy   . Hypertension     essential- benign  . Aortic valve disorder   . LBBB (left bundle branch block)     conduction disorder  . Migraines   . Osteoporosis   . Hypothyroidism   . Anxiety 01/20/11    "in the past; not now"  . Raynaud's disease   . Fibroids   . Scoliosis   . AF (paroxysmal atrial fibrillation)   . H/O: hysterectomy     secondary to uterine prolapse  . Hyperthyroidism   . ICD (implantable cardiac defibrillator) in place   . Pneumonia   . GERD (gastroesophageal reflux disease)     otc  . 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 Complication of anesthesia     aspiration  . PONV (postoperative nausea and vomiting)   . Family history of anesthesia complication     nausea  . Shortness of breath   . Arthritis    Past Surgical History  Procedure Date  . Vesicovaginal fistula closure w/ tah   . Thyroid lobectomy ~ 2006    right  . Rotator cuff repair ~ 2009    left  . Cardiac defibrillator placement 01/20/11  . Cholecystectomy ~1996  . Tubal ligation ~ 1983  . Vaginal hysterectomy ~ 2009  . Back surgery 12/31/2009    "for flat back syndrome; fused bottom of my back"  . Foot neuroma surgery ~ 2003    right  . Skin cancer excision     nose, forehead, left leg  . Scoliosis surgery   . Shoulder arthroscopy w/ rotator cuff repair 09/21/2011   Current Outpatient Prescriptions  Medication Sig Dispense Refill  . amoxicillin-clavulanate (AUGMENTIN) 875-125 MG per tablet Take 1 tablet by mouth every 12 (twelve) hours.  14 tablet  0  . antipyrine-benzocaine (AURALGAN) otic solution Place 3 drops in ear(s) every 2 (two) hours as needed. For pain      . carvedilol (COREG) 12.5 MG tablet Take 12.5 mg by mouth 2 (two) times daily with a meal.        . eletriptan (RELPAX) 40 MG tablet One tablet by mouth as needed for migraine headache.  If the headache improves and then returns, dose may be repeated after 2 hours have elapsed since first dose (do not exceed 80 mg per day). For migraine. may repeat in 2 hours if necessary       . fexofenadine (ALLEGRA) 180 MG tablet Take 180 mg by mouth daily.      . fluticasone (FLONASE) 50 MCG/ACT nasal spray Place 1 spray into the nose 2 (two) times daily.        . furosemide (LASIX) 40 MG tablet Take 20 mg by mouth daily.      Marland Kitchen  gabapentin (NEURONTIN) 600 MG tablet       . imipramine (TOFRANIL) 50 MG tablet Take 100 mg by mouth at bedtime.       Marland Kitchen levothyroxine (SYNTHROID, LEVOTHROID) 125 MCG tablet Take 125 mcg by mouth daily. 1 tablet daily      . magnesium hydroxide (PHILLIPS CHEWS) 311 MG CHEW Chew 311 mg by mouth every 4 (four) hours as needed.      . ondansetron (ZOFRAN) 4 MG tablet Take 1 tablet (4 mg total) by mouth every 6 (six) hours.  12 tablet  0  . OVER THE COUNTER MEDICATION Take 1 tablet by mouth every 8 (eight) hours as needed. For pain -  Tylenol      . spironolactone (ALDACTONE) 25 MG tablet Take 25 mg by mouth daily.       . traMADol (ULTRAM) 50 MG tablet Take 1 tablet (50 mg total) by mouth every 6 (six) hours as needed for pain.  15 tablet  0  . carbamazepine (TEGRETOL) 100 MG chewable tablet Chew 100 mg by mouth 2 (two) times daily.      Marland Kitchen  lisinopril (PRINIVIL,ZESTRIL) 2.5 MG tablet Take 2.5 mg by mouth daily.        . polyethylene glycol (MIRALAX / GLYCOLAX) packet Take 17 g by mouth daily as needed. For constipation.       . predniSONE (DELTASONE) 10 MG tablet Take 10 mg by mouth See admin instructions. Taper dose starting on 12/03/11       Allergies  Allergen Reactions  . Codeine Other (See Comments)    Chest pain  . Fentanyl Other (See Comments)    Bad dreams  . Methocarbamol Other (See Comments)    Chest pain.  Marland Kitchen Morphine And Related    Exam: BP 128/72  Pulse 88  Temp 97 F (36.1 C) (Temporal)  Resp 18  Ht 5' 7.5" (1.715 m)  Wt 160 lb (72.576 kg)  BMI 24.69 kg/m2 General: Well-developed female who is clearly uncomfortable HEENT: There is mild tenderness over the right temporal area. Right temporal artery has a palpable pulse.  Assessment and plan: Persistent right-sided headaches, rule out temple arteritis. We discussed temporal artery biopsy under local anesthesia. The indications for the procedure, its nature in recovery, and risks of bleeding and infection were discussed and understood. We will schedule this within the next few days as an outpatient.

## 2011-12-29 NOTE — Interval H&P Note (Signed)
History and Physical Interval Note:  12/29/2011 7:29 AM  [name]  has presented today for surgery, with the diagnosis of rule out temporal arteritis  The various methods of treatment have been discussed with the patient and family. After consideration of risks, benefits and other options for treatment, the patient has consented to  Procedure(s) (LRB) with comments: MINOR BIOPSY TEMPORAL ARTERY (Right) - Right temporal artery biopsy as a surgical intervention .  The patient's history has been reviewed, patient examined, no change in status, stable for surgery.  I have reviewed the patient's chart and labs.  Questions were answered to the patient's satisfaction.     [name]

## 2011-12-30 ENCOUNTER — Encounter (HOSPITAL_BASED_OUTPATIENT_CLINIC_OR_DEPARTMENT_OTHER): Payer: Self-pay | Admitting: General Surgery

## 2012-01-06 ENCOUNTER — Encounter (INDEPENDENT_AMBULATORY_CARE_PROVIDER_SITE_OTHER): Payer: BC Managed Care – PPO | Admitting: General Surgery

## 2012-01-09 ENCOUNTER — Ambulatory Visit (INDEPENDENT_AMBULATORY_CARE_PROVIDER_SITE_OTHER): Payer: BC Managed Care – PPO

## 2012-01-09 ENCOUNTER — Encounter (INDEPENDENT_AMBULATORY_CARE_PROVIDER_SITE_OTHER): Payer: Self-pay

## 2012-01-09 ENCOUNTER — Other Ambulatory Visit (INDEPENDENT_AMBULATORY_CARE_PROVIDER_SITE_OTHER): Payer: Self-pay

## 2012-01-09 VITALS — BP 102/68 | HR 72 | Temp 97.4°F | Resp 16 | Wt 157.4 lb

## 2012-01-09 DIAGNOSIS — G8918 Other acute postprocedural pain: Secondary | ICD-10-CM

## 2012-01-09 DIAGNOSIS — Z4802 Encounter for removal of sutures: Secondary | ICD-10-CM

## 2012-01-09 MED ORDER — TRAMADOL HCL 50 MG PO TABS: 50.0000 mg | ORAL_TABLET | Freq: Four times a day (QID) | ORAL | Status: DC | PRN

## 2012-01-09 NOTE — Progress Notes (Signed)
Patient returns to office today for suture removal.  Patient s/p Temporal Artery Biopsy 12/29/11.  Sutures removed, incision intact.  Patient reports discomfort along her incision.  Patient requested a refill on her Ultram, will call into [facility] in [place].  Patient advised of Benign Pathology results.  Patient will follow up with her Neurologist as needed.

## 2012-01-24 ENCOUNTER — Encounter: Payer: Self-pay | Admitting: Internal Medicine

## 2012-01-24 ENCOUNTER — Ambulatory Visit (INDEPENDENT_AMBULATORY_CARE_PROVIDER_SITE_OTHER): Payer: BC Managed Care – PPO | Admitting: Internal Medicine

## 2012-01-24 VITALS — BP 84/68 | HR 93 | Ht 67.5 in | Wt 159.6 lb

## 2012-01-24 DIAGNOSIS — I428 Other cardiomyopathies: Secondary | ICD-10-CM

## 2012-01-24 DIAGNOSIS — I447 Left bundle-branch block, unspecified: Secondary | ICD-10-CM

## 2012-01-24 DIAGNOSIS — Z95810 Presence of automatic (implantable) cardiac defibrillator: Secondary | ICD-10-CM

## 2012-01-24 LAB — ICD DEVICE OBSERVATION
AL AMPLITUDE: 1.8 mv
AL IMPEDENCE ICD: 494 Ohm
AL THRESHOLD: 0.625 V
ATRIAL PACING ICD: 0.2 pct
BAMS-0001: 170 {beats}/min
BATTERY VOLTAGE: 3.13 V
HV IMPEDENCE: 78 Ohm
LV LEAD IMPEDENCE ICD: 551 Ohm
LV LEAD THRESHOLD: 1.375 V
RV LEAD AMPLITUDE: 20 mv
RV LEAD IMPEDENCE ICD: 988 Ohm
RV LEAD THRESHOLD: 0.625 V
TZAT-0001ATACH: 1
TZAT-0001ATACH: 2
TZAT-0001ATACH: 3
TZAT-0001FASTVT: 1
TZAT-0001SLOWVT: 1
TZAT-0002ATACH: NEGATIVE
TZAT-0002ATACH: NEGATIVE
TZAT-0002ATACH: NEGATIVE
TZAT-0002FASTVT: NEGATIVE
TZAT-0002SLOWVT: NEGATIVE
TZAT-0012ATACH: 150 ms
TZAT-0012ATACH: 150 ms
TZAT-0012ATACH: 150 ms
TZAT-0012FASTVT: 200 ms
TZAT-0012SLOWVT: 200 ms
TZAT-0018ATACH: NEGATIVE
TZAT-0018ATACH: NEGATIVE
TZAT-0018ATACH: NEGATIVE
TZAT-0018FASTVT: NEGATIVE
TZAT-0018SLOWVT: NEGATIVE
TZAT-0019ATACH: 6 V
TZAT-0019ATACH: 6 V
TZAT-0019ATACH: 6 V
TZAT-0019FASTVT: 8 V
TZAT-0019SLOWVT: 8 V
TZAT-0020ATACH: 1.5 ms
TZAT-0020ATACH: 1.5 ms
TZAT-0020ATACH: 1.5 ms
TZAT-0020FASTVT: 1.5 ms
TZAT-0020SLOWVT: 1.5 ms
TZON-0003ATACH: 350 ms
TZON-0003SLOWVT: 360 ms
TZON-0003VSLOWVT: 360 ms
TZON-0004SLOWVT: 16
TZON-0004VSLOWVT: 32
TZON-0005SLOWVT: 12
TZST-0001ATACH: 4
TZST-0001ATACH: 5
TZST-0001ATACH: 6
TZST-0001FASTVT: 2
TZST-0001FASTVT: 3
TZST-0001FASTVT: 4
TZST-0001FASTVT: 5
TZST-0001FASTVT: 6
TZST-0001SLOWVT: 2
TZST-0001SLOWVT: 3
TZST-0001SLOWVT: 4
TZST-0001SLOWVT: 5
TZST-0001SLOWVT: 6
TZST-0002ATACH: NEGATIVE
TZST-0002ATACH: NEGATIVE
TZST-0002ATACH: NEGATIVE
TZST-0002FASTVT: NEGATIVE
TZST-0002FASTVT: NEGATIVE
TZST-0002FASTVT: NEGATIVE
TZST-0002FASTVT: NEGATIVE
TZST-0002FASTVT: NEGATIVE
TZST-0002SLOWVT: NEGATIVE
TZST-0002SLOWVT: NEGATIVE
TZST-0002SLOWVT: NEGATIVE
TZST-0002SLOWVT: NEGATIVE
TZST-0002SLOWVT: NEGATIVE
VENTRICULAR PACING ICD: 100 pct

## 2012-01-24 MED ORDER — CARVEDILOL 6.25 MG PO TABS: 6.2500 mg | ORAL_TABLET | Freq: Two times a day (BID) | ORAL | Status: DC

## 2012-01-24 NOTE — Addendum Note (Signed)
Addended by: Marinus Maw on: 01/24/2012 01:55 PM   Modules accepted: Level of Service

## 2012-01-24 NOTE — Assessment & Plan Note (Signed)
Her Med[employer]onic biventricular ICD is working normally. Her pacing thresholds are stable. Her fluid index was slightly elevated but it has now normalized. I've encouraged the patient to maintain a low-salt diet.

## 2012-01-24 NOTE — Assessment & Plan Note (Addendum)
Her chronic systolic heart failure is class II. Her ejection fraction has improved. Because her blood pressure is low today and has been low intermittently for the last several months, we will reduce her carvedilol from 12 mg twice daily to 6 mg twice daily.

## 2012-01-24 NOTE — Progress Notes (Addendum)
HPI [name] returns today for followup. She is a very pleasant 57 year old woman with a nonischemic cardiomyopathy, chronic systolic heart failure, left bundle branch block, status post biventricular ICD implantation. In the interim, she has been stable except for problems with facial pain. She notes that time she gets dizzy and lightheaded. At times her energy level is low. She notes that when she feels the symptoms, she will check her blood pressure and find that it is also low. She has had no frank syncope and no ICD shocks. Allergies  Allergen Reactions  . Codeine Other (See Comments)    Chest pain  . Fentanyl Other (See Comments)    Bad dreams  . Methocarbamol Other (See Comments)    Chest pain.  Marland Kitchen Morphine And Related      Current Outpatient Prescriptions  Medication Sig Dispense Refill  . ALPRAZolam (XANAX) 1 MG tablet Take 1-2 tablets (1-2 mg total) by mouth once.  30 tablet  0  . amoxicillin-clavulanate (AUGMENTIN) 875-125 MG per tablet Take 1 tablet by mouth every 12 (twelve) hours.  14 tablet  0  . antipyrine-benzocaine (AURALGAN) otic solution Place 3 drops in ear(s) every 2 (two) hours as needed. For pain      . carbamazepine (TEGRETOL) 100 MG chewable tablet Chew 100 mg by mouth 2 (two) times daily.      Marland Kitchen eletriptan (RELPAX) 40 MG tablet One tablet by mouth as needed for migraine headache.  If the headache improves and then returns, dose may be repeated after 2 hours have elapsed since first dose (do not exceed 80 mg per day). For migraine. may repeat in 2 hours if necessary       . fexofenadine (ALLEGRA) 180 MG tablet Take 180 mg by mouth daily.      . fluticasone (FLONASE) 50 MCG/ACT nasal spray Place 1 spray into the nose 2 (two) times daily.        . furosemide (LASIX) 40 MG tablet Take 20 mg by mouth daily.      Marland Kitchen gabapentin (NEURONTIN) 600 MG tablet       . imipramine (TOFRANIL) 50 MG tablet Take 100 mg by mouth at bedtime.       Marland Kitchen levothyroxine (SYNTHROID,  LEVOTHROID) 125 MCG tablet Take 125 mcg by mouth daily. 1 tablet daily      . lisinopril (PRINIVIL,ZESTRIL) 2.5 MG tablet Take 2.5 mg by mouth daily.        . magnesium hydroxide (PHILLIPS CHEWS) 311 MG CHEW Chew 311 mg by mouth every 4 (four) hours as needed.      . ondansetron (ZOFRAN) 4 MG tablet Take 1 tablet (4 mg total) by mouth every 6 (six) hours.  12 tablet  0  . OVER THE COUNTER MEDICATION Take 1 tablet by mouth every 8 (eight) hours as needed. For pain -  Tylenol      . polyethylene glycol (MIRALAX / GLYCOLAX) packet Take 17 g by mouth daily as needed. For constipation.       Marland Kitchen spironolactone (ALDACTONE) 25 MG tablet Take 25 mg by mouth daily.       . carvedilol (COREG) 6.25 MG tablet Take 1 tablet (6.25 mg total) by mouth 2 (two) times daily.  180 tablet  3  . traMADol (ULTRAM) 50 MG tablet Take 1 tablet (50 mg total) by mouth every 6 (six) hours as needed for pain.  15 tablet  0     Past Medical History  Diagnosis Date  . Pleural  effusion, right     thoracentesis 02/09/09   . CHF (congestive heart failure)     Dr. Donnie Aho  . Cardiomyopathy   . Hypertension     essential- benign  . Aortic valve disorder   . LBBB (left bundle branch block)     conduction disorder  . Migraines   . Osteoporosis   . Hypothyroidism   . Anxiety 01/20/11    "in the past; not now"  . Raynaud's disease   . Fibroids   . Scoliosis   . AF (paroxysmal atrial fibrillation)   . H/O: hysterectomy     secondary to uterine prolapse  . Hyperthyroidism   . ICD (implantable cardiac defibrillator) in place   . Pneumonia   . GERD (gastroesophageal reflux disease)     otc  . 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 Complication of anesthesia     aspiration  . PONV (postoperative nausea and vomiting)   . Family history of anesthesia complication     nausea  . Shortness of breath   . Arthritis     ROS:   All systems reviewed and negative except as noted in the  HPI.   Past Surgical History  Procedure Date  . Vesicovaginal fistula closure w/ tah   . Thyroid lobectomy ~ 2006    right  . Rotator cuff repair ~ 2009    left  . Cardiac defibrillator placement 01/20/11  . Cholecystectomy ~1996  . Tubal ligation ~ 1983  . Vaginal hysterectomy ~ 2009  . Back surgery 12/31/2009    "for flat back syndrome; fused bottom of my back"  . Foot neuroma surgery ~ 2003    right  . Skin cancer excision     nose, forehead, left leg  . Scoliosis surgery   . Shoulder arthroscopy w/ rotator cuff repair 09/21/2011  . Artery biopsy 12/29/2011    Procedure: MINOR BIOPSY TEMPORAL ARTERY;  Surgeon: Mariella Saa, MD;  Location: [facility];  Service: General;  Laterality: Right;  Right temporal artery biopsy     Family History  Problem Relation Age of Onset  . Heart disease Mother   . Heart disease Sister   . Heart disease Father 77  . Macular degeneration Father   . Heart failure Mother 32    CHF  . Stroke Mother   . Hypertension Mother   . Atrial fibrillation Sister   . Heart disease Sister      History   Social History  . Marital Status: Married    Spouse Name: N/A    Number of Children: N/A  . Years of Education: N/A   Occupational History  . Teacher    Social History Main Topics  . Smoking status: Never Smoker   . Smokeless tobacco: Never Used  . Alcohol Use: No  . Drug Use: No  . Sexually Active: Yes   Other Topics Concern  . Not on file   Social History Narrative  . No narrative on file     BP 84/68  Pulse 93  Ht 5' 7.5" (1.715 m)  Wt 159 lb 9.6 oz (72.394 kg)  BMI 24.63 kg/m2  Physical Exam:  Well appearing middle-aged woman, NAD HEENT: Unremarkable Neck:  6 cm JVD, no thyromegally Lungs:  Clear with no wheezes, rales, or rhonchi. Well-healed ICD incision. HEART:  Regular rate rhythm, no murmurs, no rubs, no clicks Abd:  soft, positive bowel sounds, no  organomegally, no rebound, no guarding Ext:   2 plus pulses, no edema, no cyanosis, no clubbing Skin:  No rashes no nodules Neuro:  CN II through XII intact, motor grossly intact  DEVICE  Normal device function.  See PaceArt for details.   Assess/Plan:

## 2012-01-24 NOTE — Patient Instructions (Signed)
Follow up with Dr. Donnie Aho.  Change Coreg dose to 6.25mg  twice daily.  [facility] [place].

## 2012-04-12 ENCOUNTER — Other Ambulatory Visit: Payer: Self-pay | Admitting: Diagnostic Neuroimaging

## 2012-04-12 MED ORDER — GABAPENTIN (ONCE-DAILY) 600 MG PO TABS: 600.0000 mg | ORAL_TABLET | ORAL | Status: DC

## 2012-04-30 ENCOUNTER — Encounter (INDEPENDENT_AMBULATORY_CARE_PROVIDER_SITE_OTHER): Payer: Self-pay | Admitting: *Deleted

## 2012-05-02 ENCOUNTER — Ambulatory Visit (INDEPENDENT_AMBULATORY_CARE_PROVIDER_SITE_OTHER): Payer: BC Managed Care – PPO | Admitting: General Surgery

## 2012-05-02 ENCOUNTER — Encounter (INDEPENDENT_AMBULATORY_CARE_PROVIDER_SITE_OTHER): Payer: Self-pay | Admitting: General Surgery

## 2012-05-02 VITALS — BP 102/68 | HR 71 | Temp 97.2°F | Resp 16 | Ht 67.5 in | Wt 156.2 lb

## 2012-05-02 DIAGNOSIS — R234 Changes in skin texture: Secondary | ICD-10-CM

## 2012-05-02 NOTE — Progress Notes (Signed)
Chief complaint: Skin lesion right nipple  History: Patient returns to the office, referred by Dr. Collins Scotland, for skin changes at the right nipple. A couple of weeks ago she noted a small lump with crusty skin changes at the right nipple. She has not had any previous similar symptoms. She has no personal history of breast disease or breast cancer or family history of breast cancer. No nipple bleeding. Or discharge. She states that she saw her dermatologist since she noted the area and that she expressed a small amount of material from the area and is now completely resolved..  Exam: BP 102/68  Pulse 71  Temp(Src) 97.2 F (36.2 C) (Temporal)  Resp 16  Ht 5' 7.5" (1.715 m)  Wt 156 lb 3.2 oz (70.852 kg)  BMI 24.09 kg/m2 General: No distress Lymph nodes: No cervical, subclavicular or axillary nodes palpable Breasts: Currently there are no skin changes on either breast particular attention to the right nipple. No breast or skin masses or nipple discharge in neither breast.  Mammogram was done at [facility] reported negative to the patient and we will obtain a copy  Assessment and plan: Skin lesion right nipple that has spontaneously resolved after discharge. I suspect this was an inflamed areolar sebaceous cyst. There are no abnormalities on examination today. I told her to call should she have any recurrent symptoms and otherwise continue self exam and annual mammogram.

## 2012-05-11 ENCOUNTER — Encounter (INDEPENDENT_AMBULATORY_CARE_PROVIDER_SITE_OTHER): Payer: BC Managed Care – PPO | Admitting: General Surgery

## 2012-05-17 ENCOUNTER — Encounter (INDEPENDENT_AMBULATORY_CARE_PROVIDER_SITE_OTHER): Payer: Self-pay

## 2012-10-09 ENCOUNTER — Encounter (INDEPENDENT_AMBULATORY_CARE_PROVIDER_SITE_OTHER): Payer: Self-pay

## 2012-11-14 ENCOUNTER — Other Ambulatory Visit: Payer: Self-pay | Admitting: Diagnostic Neuroimaging

## 2012-11-22 ENCOUNTER — Other Ambulatory Visit: Payer: Self-pay | Admitting: Diagnostic Neuroimaging

## 2012-12-14 ENCOUNTER — Ambulatory Visit (INDEPENDENT_AMBULATORY_CARE_PROVIDER_SITE_OTHER): Payer: Medicare Other | Admitting: Diagnostic Neuroimaging

## 2012-12-14 ENCOUNTER — Encounter (INDEPENDENT_AMBULATORY_CARE_PROVIDER_SITE_OTHER): Payer: Self-pay

## 2012-12-14 ENCOUNTER — Encounter: Payer: Self-pay | Admitting: Diagnostic Neuroimaging

## 2012-12-14 VITALS — BP 84/58 | HR 72 | Temp 98.4°F | Ht 67.5 in | Wt 149.0 lb

## 2012-12-14 DIAGNOSIS — G500 Trigeminal neuralgia: Secondary | ICD-10-CM | POA: Insufficient documentation

## 2012-12-14 MED ORDER — GABAPENTIN 600 MG PO TABS: 600.0000 mg | ORAL_TABLET | Freq: Three times a day (TID) | ORAL | Status: DC

## 2012-12-14 NOTE — Progress Notes (Signed)
[facility]  PATIENT: [name] DOB: 1956/01/05  REFERRING CLINICIAN:  HISTORY FROM: patient REASON FOR VISIT: follow up   HISTORICAL  CHIEF COMPLAINT:  Chief Complaint  Patient presents with  . Follow-up    numbness inface,  trigeminal neuralgia    HISTORY OF PRESENT ILLNESS:   UPDATE 12/14/12: Since last visit patient is doing well. Headache and trigeminal neuralgia pain symptoms are stable. Tolerating gabapentin.  UPDATE 03/26/12: Since last visit, right temporal artery biopsy done and was normal. Symptoms better on gabapentin 600mg  TID. Some residual numbness in right face. Pain is much better. Tried BID gabapentin, but pain increased.   PRIOR HPI (01/21/09): 57 year old female with history of CHF, AICD placement, back surgery, thyroid surgery, migraine, here for evaluation of right-sided headache, eye pain, jaw pain, right facial numbness. In symptoms started in November 2013. I previously saw patient for a different problem consisting of lower extremity muscle pain and cramps. Patient describes right eye pain, nausea, headache, right facial numbness. She describes a stabbing sensation in her right eye. She had some photophobia and phonophobia. Patient into the ER multiple times and treated with "headache cocktails" with mild relief. She's been tried on gabapentin, tramadol, Zofran, carbamazepine, prednisone Dosepak with mild relief. CT head in ER was unremarkable.  REVIEW OF SYSTEMS: Full 14 system review of systems performed and notable only for numbness ringing in ears.  ALLERGIES: Allergies  Allergen Reactions  . Codeine Other (See Comments)    Chest pain  . Fentanyl Other (See Comments)    Bad dreams  . Methocarbamol Other (See Comments)    Chest pain.  Marland Kitchen Morphine And Related     HOME MEDICATIONS: Outpatient Prescriptions Prior to Visit  Medication Sig Dispense Refill  . carvedilol (COREG) 6.25 MG tablet Take 1 tablet (6.25 mg total) by  mouth 2 (two) times daily.  180 tablet  3  . eletriptan (RELPAX) 40 MG tablet One tablet by mouth as needed for migraine headache.  If the headache improves and then returns, dose may be repeated after 2 hours have elapsed since first dose (do not exceed 80 mg per day). For migraine. may repeat in 2 hours if necessary       . fexofenadine (ALLEGRA) 180 MG tablet Take 180 mg by mouth daily.      . furosemide (LASIX) 40 MG tablet Take 20 mg by mouth daily.      Marland Kitchen imipramine (TOFRANIL) 50 MG tablet Take 100 mg by mouth at bedtime.       Marland Kitchen levothyroxine (SYNTHROID, LEVOTHROID) 125 MCG tablet Take 125 mcg by mouth daily. 1 tablet daily      . lisinopril (PRINIVIL,ZESTRIL) 2.5 MG tablet Take 2.5 mg by mouth daily.        Marland Kitchen spironolactone (ALDACTONE) 25 MG tablet Take 25 mg by mouth daily.       Marland Kitchen gabapentin (NEURONTIN) 600 MG tablet TAKE 1 TABLET BY MOUTH 3 TIMES A DAY  270 tablet  4  . Gabapentin, PHN, (GRALISE) 600 MG TABS Take 600 mg by mouth as directed. Three po every evening  90 tablet  12  . ALPRAZolam (XANAX) 1 MG tablet Take 1-2 tablets (1-2 mg total) by mouth once.  30 tablet  0  . antipyrine-benzocaine (AURALGAN) otic solution Place 3 drops in ear(s) every 2 (two) hours as needed. For pain      . carbamazepine (TEGRETOL) 100 MG chewable tablet Chew 100 mg by mouth 2 (two) times daily.      Marland Kitchen  fluticasone (FLONASE) 50 MCG/ACT nasal spray Place 1 spray into the nose 2 (two) times daily.        . magnesium hydroxide (PHILLIPS CHEWS) 311 MG CHEW Chew 311 mg by mouth every 4 (four) hours as needed.      . ondansetron (ZOFRAN) 4 MG tablet Take 1 tablet (4 mg total) by mouth every 6 (six) hours.  12 tablet  0  . OVER THE COUNTER MEDICATION Take 1 tablet by mouth every 8 (eight) hours as needed. For pain -  Tylenol      . polyethylene glycol (MIRALAX / GLYCOLAX) packet Take 17 g by mouth daily as needed. For constipation.       . traMADol (ULTRAM) 50 MG tablet Take 1 tablet (50 mg total) by mouth every  6 (six) hours as needed for pain.  15 tablet  0   No facility-administered medications prior to visit.    PAST MEDICAL HISTORY: Past Medical History  Diagnosis Date  . Pleural effusion, right     thoracentesis 02/09/09   . CHF (congestive heart failure)     Dr. Donnie Aho  . Cardiomyopathy   . Hypertension     essential- benign  . Aortic valve disorder   . LBBB (left bundle branch block)     conduction disorder  . Migraines   . Osteoporosis   . Hypothyroidism   . Anxiety 01/20/11    "in the past; not now"  . Raynaud's disease   . Fibroids   . Scoliosis   . AF (paroxysmal atrial fibrillation)   . H/O: hysterectomy     secondary to uterine prolapse  . Hyperthyroidism   . ICD (implantable cardiac defibrillator) in place   . Pneumonia   . GERD (gastroesophageal reflux disease)     otc  . 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 Complication of anesthesia     aspiration  . PONV (postoperative nausea and vomiting)   . Family history of anesthesia complication     nausea  . Shortness of breath   . Arthritis     PAST SURGICAL HISTORY: Past Surgical History  Procedure Laterality Date  . Vesicovaginal fistula closure w/ tah    . Thyroid lobectomy  ~ 2006    right  . Rotator cuff repair  ~ 2009    left  . Cardiac defibrillator placement  01/20/11  . Cholecystectomy  ~1996  . Tubal ligation  ~ 1983  . Vaginal hysterectomy  ~ 2009  . Back surgery  12/31/2009    "for flat back syndrome; fused bottom of my back"  . Foot neuroma surgery  ~ 2003    right  . Skin cancer excision      nose, forehead, left leg  . Scoliosis surgery    . Shoulder arthroscopy w/ rotator cuff repair  09/21/2011  . Artery biopsy  12/29/2011    Procedure: MINOR BIOPSY TEMPORAL ARTERY;  Surgeon: Mariella Saa, MD;  Location: [facility];  Service: General;  Laterality: Right;  Right temporal artery biopsy    FAMILY HISTORY: Family History  Problem  Relation Age of Onset  . Heart disease Mother   . Heart failure Mother 16    CHF  . Stroke Mother   . Hypertension Mother   . Heart disease Sister   . Heart disease Father 25  . Macular degeneration Father   . Atrial fibrillation Sister   . Heart disease  Sister     SOCIAL HISTORY:  History   Social History  . Marital Status: Married    Spouse Name: Kathlene November    Number of Children: 1  . Years of Education: MA   Occupational History  . Teacher     n/a   Social History Main Topics  . Smoking status: Never Smoker   . Smokeless tobacco: Never Used  . Alcohol Use: No  . Drug Use: No  . Sexual Activity: Yes   Other Topics Concern  . Not on file   Social History Narrative   Patient lives at home with family.   Caffeine Use: 1-2 cups daily     PHYSICAL EXAM In Filed Vitals:   12/14/12 1120  BP: 84/58  Pulse: 72  Temp: 98.4 F (36.9 C)  TempSrc: Oral  Height: 5' 7.5" (1.715 m)  Weight: 149 lb (67.586 kg)    Not recorded    Body mass index is 22.98 kg/(m^2).  GENERAL EXAM: Patient is in no distress; well developed, nourished and groomed; MASKED FACIES. SUBTLE HEAD TREMOR.  CARDIOVASCULAR: Regular rate and rhythm, no murmurs, no carotid bruits  NEUROLOGIC: MENTAL STATUS: awake, alert, oriented to person, place and time, normal attention and concentration, language fluent, comprehension intact, naming intact, fund of knowledge appropriate CRANIAL NERVE: no papilledema on fundoscopic exam, pupils equal and reactive to light, visual fields full to confrontation, extraocular muscles intact, no nystagmus, SUBJECTIVE NUMBESS IN RIGHT V2 REGION OF FACE, facial strength symmetric, hearing intact, palate elevates symmetrically, uvula midline, shoulder shrug symmetric, tongue midline. MOTOR: normal bulk and tone, full strength in the BUE, BLE; NO TREMOR. NO RIGIDITY. SENSORY: normal and symmetric to light touch COORDINATION: finger-nose-finger, fine finger movements, toe  tapping SLOW THROUGHOUT REFLEXES: deep tendon reflexes present and symmetric GAIT/STATION: narrow based gait; DECR ARM SWING. SMOOTH STRIDE. SMOOTH TURNING.   DIAGNOSTIC DATA (LABS, IMAGING, TESTING) - I reviewed patient records, labs, notes, testing and imaging myself where available.  Lab Results  Component Value Date   WBC 7.6 09/14/2011   HGB 13.1 09/14/2011   HCT 38.9 09/14/2011   MCV 89.6 09/14/2011   PLT 293 09/14/2011      Component Value Date/Time   NA 138 09/14/2011 1055   K 4.3 09/14/2011 1055   CL 101 09/14/2011 1055   CO2 31 09/14/2011 1055   GLUCOSE 98 09/14/2011 1055   BUN 26* 09/14/2011 1055   CREATININE 1.22* 09/14/2011 1055   CALCIUM 9.7 09/14/2011 1055   PROT 5.7* 02/11/2010 0555   ALBUMIN 2.5* 02/11/2010 0555   AST 19 02/11/2010 0555   ALT 19 02/11/2010 0555   ALKPHOS 81 02/11/2010 0555   BILITOT 0.7 02/11/2010 0555   GFRNONAA 48* 09/14/2011 1055   GFRAA 56* 09/14/2011 1055   Lab Results  Component Value Date   CHOL  Value: 103        ATP III CLASSIFICATION:  <200     mg/dL   Desirable  161-096  mg/dL   Borderline High  >=045    mg/dL   High        4/0/9811   HDL 42 02/11/2010   LDLCALC  Value: 44        Total Cholesterol/HDL:CHD Risk Coronary Heart Disease Risk Table                     Men   Women  1/2 Average Risk   3.4   3.3  Average Risk  5.0   4.4  2 X Average Risk   9.6   7.1  3 X Average Risk  23.4   11.0        Use the calculated Patient Ratio above and the CHD Risk Table to determine the patient's CHD Risk.        ATP III CLASSIFICATION (LDL):  <100     mg/dL   Optimal  161-096  mg/dL   Near or Above                    Optimal  130-159  mg/dL   Borderline  045-409  mg/dL   High  >811     mg/dL   Very High 09/10/4780   TRIG 85 02/11/2010   CHOLHDL 2.5 02/11/2010   No results found for this basename: HGBA1C   Lab Results  Component Value Date   VITAMINB12 302 02/12/2010   Lab Results  Component Value Date   TSH 2.531 02/08/2010    01/30/09 EMG/NCS - early recruitment of small,  polyphasic motor units that suggests a myopathy.  There is no evidence of a neuropathy or radiculopathy.  Sed Rate  Date Value Range Status  02/12/2010 55* 0 - 22 mm/hr Final   12/02/11 CT head - normal   12/29/11 right temporal artery biopsy - NORMAL   ASSESSMENT AND PLAN  57 y.o. year old female here with right trigeminal neuralgia, stable. Doing well.  Ddx: trigeminal neuralgia, migraine variant, cluster HA  PLAN: - refill gabapentin  Return in about 1 year (around 12/14/2013) for with Heide Guile or Sandria Mcenroe.    Suanne Marker, MD 12/14/2012, 12:39 PM Certified in Neurology, Neurophysiology and Neuroimaging  [facility] [address] [place], [place] [address] [phone]

## 2013-01-02 ENCOUNTER — Other Ambulatory Visit: Payer: Self-pay | Admitting: Internal Medicine

## 2013-02-19 ENCOUNTER — Encounter: Payer: Self-pay | Admitting: Internal Medicine

## 2013-02-19 ENCOUNTER — Ambulatory Visit (INDEPENDENT_AMBULATORY_CARE_PROVIDER_SITE_OTHER): Payer: Medicare Other | Admitting: Internal Medicine

## 2013-02-19 VITALS — BP 110/74 | HR 58 | Ht 67.5 in | Wt 148.2 lb

## 2013-02-19 DIAGNOSIS — I428 Other cardiomyopathies: Secondary | ICD-10-CM

## 2013-02-19 DIAGNOSIS — G500 Trigeminal neuralgia: Secondary | ICD-10-CM

## 2013-02-19 DIAGNOSIS — I447 Left bundle-branch block, unspecified: Secondary | ICD-10-CM

## 2013-02-19 DIAGNOSIS — Z95810 Presence of automatic (implantable) cardiac defibrillator: Secondary | ICD-10-CM

## 2013-02-19 NOTE — Progress Notes (Signed)
HPI [name] returns today for followup. She is a very pleasant 58 year old woman with a nonischemic cardiomyopathy, chronic systolic heart failure, left bundle branch block, status post biventricular ICD implantation. In the interim, she has been stable. She notes that her CHF symptoms are actually improved.  She has had no frank syncope and no ICD shocks. Allergies  Allergen Reactions  . Codeine Other (See Comments)    Chest pain  . Fentanyl Other (See Comments)    Bad dreams  . Methocarbamol Other (See Comments)    Chest pain.  Marland Kitchen Morphine And Related      Current Outpatient Prescriptions  Medication Sig Dispense Refill  . acetaminophen (TYLENOL) 325 MG tablet Take 650 mg by mouth every 6 (six) hours as needed.      Marland Kitchen alendronate (FOSAMAX) 10 MG tablet Take 10 mg by mouth once a week. Take with a full glass of water on an empty stomach.      . Calcium Citrate-Vitamin D (CITRACAL + D PO) Take 1 capsule by mouth daily.      . carvedilol (COREG) 6.25 MG tablet TAKE 1 TABLET (6.25 MG TOTAL) BY MOUTH 2 (TWO) TIMES DAILY.  180 tablet  0  . cetirizine (ZYRTEC) 10 MG tablet Take 10 mg by mouth daily.      Marland Kitchen eletriptan (RELPAX) 40 MG tablet One tablet by mouth as needed for migraine headache.  If the headache improves and then returns, dose may be repeated after 2 hours have elapsed since first dose (do not exceed 80 mg per day). For migraine. may repeat in 2 hours if necessary       . furosemide (LASIX) 40 MG tablet Take 20 mg by mouth daily.      Marland Kitchen gabapentin (NEURONTIN) 600 MG tablet Take 1 tablet (600 mg total) by mouth 3 (three) times daily.  270 tablet  4  . imipramine (TOFRANIL) 25 MG tablet Take 75 mg by mouth at bedtime.      Marland Kitchen levothyroxine (SYNTHROID, LEVOTHROID) 125 MCG tablet Take 125 mcg by mouth daily. 1 tablet daily      . lisinopril (PRINIVIL,ZESTRIL) 2.5 MG tablet Take 2.5 mg by mouth daily.        . Magnesium 250 MG TABS Take 1 tablet by mouth 2 (two) times daily.      .  pseudoephedrine-acetaminophen (TYLENOL SINUS) 30-500 MG TABS Take 1 tablet by mouth every 4 (four) hours as needed.      Marland Kitchen spironolactone (ALDACTONE) 25 MG tablet Take 25 mg by mouth daily.       . Vitamin D, Ergocalciferol, (DRISDOL) 50000 UNITS CAPS capsule Take 50,000 Units by mouth every 7 (seven) days.       No current facility-administered medications for this visit.     Past Medical History  Diagnosis Date  . Pleural effusion, right     thoracentesis 02/09/09   . CHF (congestive heart failure)     Dr. Wynonia Schmidt  . Cardiomyopathy   . Hypertension     essential- benign  . Aortic valve disorder   . LBBB (left bundle branch block)     conduction disorder  . Migraines   . Osteoporosis   . Hypothyroidism   . Anxiety 01/20/11    "in the past; not now"  . Raynaud's disease   . Fibroids   . Scoliosis   . AF (paroxysmal atrial fibrillation)   . H/O: hysterectomy     secondary to uterine prolapse  . Hyperthyroidism   .  ICD (implantable cardiac defibrillator) in place   . Pneumonia   . GERD (gastroesophageal reflux disease)     otc  . 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 Complication of anesthesia     aspiration  . PONV (postoperative nausea and vomiting)   . Family history of anesthesia complication     nausea  . Shortness of breath   . Arthritis     ROS:   All systems reviewed and negative except as noted in the HPI.   Past Surgical History  Procedure Laterality Date  . Vesicovaginal fistula closure w/ tah    . Thyroid lobectomy  ~ 2006    right  . Rotator cuff repair  ~ 2009    left  . Cardiac defibrillator placement  01/20/11  . Cholecystectomy  ~1996  . Tubal ligation  ~ 1983  . Vaginal hysterectomy  ~ 2009  . Back surgery  12/31/2009    "for flat back syndrome; fused bottom of my back"  . Foot neuroma surgery  ~ 2003    right  . Skin cancer excision      nose, forehead, left leg  . Scoliosis surgery    . Shoulder arthroscopy  w/ rotator cuff repair  09/21/2011  . Artery biopsy  12/29/2011    Procedure: MINOR BIOPSY TEMPORAL ARTERY;  Surgeon: Dana Jolly, MD;  Location: [facility];  Service: General;  Laterality: Right;  Right temporal artery biopsy     Family History  Problem Relation Age of Onset  . Heart disease Mother   . Heart failure Mother 92    CHF  . Stroke Mother   . Hypertension Mother   . Heart disease Sister   . Heart disease Father 45  . Macular degeneration Father   . Atrial fibrillation Sister   . Heart disease Sister      History   Social History  . Marital Status: Married    Spouse Name: [name]    Number of Children: 1  . Years of Education: MA   Occupational History  . Teacher     n/a   Social History Main Topics  . Smoking status: Never Smoker   . Smokeless tobacco: Never Used  . Alcohol Use: No  . Drug Use: No  . Sexual Activity: Yes   Other Topics Concern  . Not on file   Social History Narrative   Patient lives at home with family.   Caffeine Use: 1-2 cups daily     BP 110/74  Pulse 58  Ht 5' 7.5" (1.715 m)  Wt 148 lb 3.2 oz (67.223 kg)  BMI 22.86 kg/m2  Physical Exam:  Well appearing middle-aged woman, NAD HEENT: Unremarkable Neck:  6 cm JVD, no thyromegally Lungs:  Clear with no wheezes, rales, or rhonchi. Well-healed ICD incision. HEART:  Regular rate rhythm, no murmurs, no rubs, no clicks Abd:  soft, positive bowel sounds, no organomegally, no rebound, no guarding Ext:  2 plus pulses, no edema, no cyanosis, no clubbing Skin:  No rashes no nodules Neuro:  CN II through XII intact, motor grossly intact  ECG - NSR with Biv pacing  DEVICE  Normal device function.  See PaceArt for details.   Assess/Plan:

## 2013-02-19 NOTE — Assessment & Plan Note (Signed)
Her medtronic BiV ICD is working normally. Will recheck in a year.

## 2013-02-19 NOTE — Assessment & Plan Note (Signed)
Her facial pain is much improved. She will continue her current meds.

## 2013-02-19 NOTE — Patient Instructions (Signed)
.  Continue remote follow ups with Dr.Tilley.  Your physician recommends that you schedule a follow-up appointment in: 12 months with Dr.Taylor

## 2013-02-21 LAB — MDC_IDC_ENUM_SESS_TYPE_INCLINIC
Battery Voltage: 3.09 V
Brady Statistic AP VP Percent: 0.05 %
Brady Statistic AP VS Percent: 0.01 %
Brady Statistic AS VP Percent: 99.91 %
Brady Statistic AS VS Percent: 0.03 %
Brady Statistic RA Percent Paced: 0.06 %
Brady Statistic RV Percent Paced: 99.96 %
Date Time Interrogation Session: 20150210223016
HighPow Impedance: 86 Ohm
Lead Channel Impedance Value: 1064 Ohm
Lead Channel Impedance Value: 1121 Ohm
Lead Channel Impedance Value: 494 Ohm
Lead Channel Impedance Value: 646 Ohm
Lead Channel Impedance Value: 646 Ohm
Lead Channel Pacing Threshold Amplitude: 0.625 V
Lead Channel Pacing Threshold Amplitude: 0.75 V
Lead Channel Pacing Threshold Amplitude: 1.25 V
Lead Channel Pacing Threshold Pulse Width: 0.4 ms
Lead Channel Pacing Threshold Pulse Width: 0.4 ms
Lead Channel Pacing Threshold Pulse Width: 0.8 ms
Lead Channel Sensing Intrinsic Amplitude: 1 mV
Lead Channel Sensing Intrinsic Amplitude: 1.75 mV
Lead Channel Sensing Intrinsic Amplitude: 22.625 mV
Lead Channel Sensing Intrinsic Amplitude: 24.375 mV
Lead Channel Setting Pacing Amplitude: 1.5 V
Lead Channel Setting Pacing Amplitude: 2 V
Lead Channel Setting Pacing Amplitude: 2.25 V
Lead Channel Setting Pacing Pulse Width: 0.4 ms
Lead Channel Setting Pacing Pulse Width: 0.8 ms
Lead Channel Setting Sensing Sensitivity: 0.3 mV
Zone Setting Detection Interval: 320 ms
Zone Setting Detection Interval: 350 ms
Zone Setting Detection Interval: 360 ms
Zone Setting Detection Interval: 360 ms

## 2013-03-30 ENCOUNTER — Other Ambulatory Visit: Payer: Self-pay | Admitting: Internal Medicine

## 2013-05-29 ENCOUNTER — Ambulatory Visit (INDEPENDENT_AMBULATORY_CARE_PROVIDER_SITE_OTHER): Payer: Medicare Other | Admitting: Diagnostic Neuroimaging

## 2013-05-29 ENCOUNTER — Encounter (INDEPENDENT_AMBULATORY_CARE_PROVIDER_SITE_OTHER): Payer: Self-pay

## 2013-05-29 ENCOUNTER — Encounter: Payer: Self-pay | Admitting: Diagnostic Neuroimaging

## 2013-05-29 VITALS — BP 98/65 | HR 72 | Temp 97.8°F | Ht 67.5 in | Wt 152.0 lb

## 2013-05-29 DIAGNOSIS — G500 Trigeminal neuralgia: Secondary | ICD-10-CM

## 2013-05-29 DIAGNOSIS — G250 Essential tremor: Secondary | ICD-10-CM

## 2013-05-29 DIAGNOSIS — G252 Other specified forms of tremor: Secondary | ICD-10-CM

## 2013-05-29 NOTE — Progress Notes (Signed)
[facility]  PATIENT: [name] DOB: [birth year]  REFERRING CLINICIAN:  HISTORY FROM: patient REASON FOR VISIT: follow up   HISTORICAL  CHIEF COMPLAINT:  Chief Complaint  Patient presents with  . Tremors    head tremor    HISTORY OF PRESENT ILLNESS:   UPDATE 05/29/13: Facial pain is stable, except when she self-tapered gabapentin evening dose and she had more right face pain. Now on TID dosing and stable. Also, her son and husand have noticed head tremor. She is concerned about this and self-conscious at church now. Patient's sister has hand tremor, diagnosed as essential tremor and on propranolol. Her father had hand tremor. Patient's maternal grandmother and maternal cousin had head tremor. Patient denies any smell or taste difficulty, constipation, hand tremor, new balance or gait difficulty. Chronic balance issues are present from low back pain, surgery and left leg numbness.  UPDATE 12/14/12: Since last visit patient is doing well. Headache and trigeminal neuralgia pain symptoms are stable. Tolerating gabapentin.  UPDATE 03/26/12: Since last visit, right temporal artery biopsy done and was normal. Symptoms better on gabapentin 600mg  TID. Some residual numbness in right face. Pain is much better. Tried BID gabapentin, but pain increased.   PRIOR HPI (01/21/09): 58 year old female with history of CHF, AICD placement, back surgery, thyroid surgery, migraine, here for evaluation of right-sided headache, eye pain, jaw pain, right facial numbness. In symptoms started in November 2013. I previously saw patient for a different problem consisting of lower extremity muscle pain and cramps. Patient describes right eye pain, nausea, headache, right facial numbness. She describes a stabbing sensation in her right eye. She had some photophobia and phonophobia. Patient into the ER multiple times and treated with "headache cocktails" with mild relief. She's been tried on  gabapentin, tramadol, Zofran, carbamazepine, prednisone Dosepak with mild relief. CT head in ER was unremarkable.  REVIEW OF SYSTEMS: Full 14 system review of systems performed and notable only for ringing in ears eye redness head tremor.   ALLERGIES: Allergies  Allergen Reactions  . Codeine Other (See Comments)    Chest pain  . Fentanyl Other (See Comments)    Bad dreams  . Methocarbamol Other (See Comments)    Chest pain.  Marland Kitchen Morphine And Related     HOME MEDICATIONS: Outpatient Prescriptions Prior to Visit  Medication Sig Dispense Refill  . acetaminophen (TYLENOL) 325 MG tablet Take 650 mg by mouth every 6 (six) hours as needed.      Marland Kitchen alendronate (FOSAMAX) 10 MG tablet Take 10 mg by mouth once a week. Take with a full glass of water on an empty stomach.      . Calcium Citrate-Vitamin D (CITRACAL + D PO) Take 1 capsule by mouth daily.      . carvedilol (COREG) 6.25 MG tablet TAKE 1 TABLET (6.25 MG TOTAL) BY MOUTH 2 (TWO) TIMES DAILY.  180 tablet  0  . cetirizine (ZYRTEC) 10 MG tablet Take 10 mg by mouth daily.      Marland Kitchen eletriptan (RELPAX) 40 MG tablet One tablet by mouth as needed for migraine headache.  If the headache improves and then returns, dose may be repeated after 2 hours have elapsed since first dose (do not exceed 80 mg per day). For migraine. may repeat in 2 hours if necessary       . furosemide (LASIX) 40 MG tablet Take 20 mg by mouth daily.      Marland Kitchen gabapentin (NEURONTIN) 600 MG tablet Take 1 tablet (  600 mg total) by mouth 3 (three) times daily.  270 tablet  4  . imipramine (TOFRANIL) 25 MG tablet Take 75 mg by mouth at bedtime.      Marland Kitchen levothyroxine (SYNTHROID, LEVOTHROID) 125 MCG tablet Take 125 mcg by mouth daily. 1 tablet daily      . lisinopril (PRINIVIL,ZESTRIL) 2.5 MG tablet Take 2.5 mg by mouth daily.        . Magnesium 250 MG TABS Take 1 tablet by mouth 2 (two) times daily.      . pseudoephedrine-acetaminophen (TYLENOL SINUS) 30-500 MG TABS Take 1 tablet by mouth  every 4 (four) hours as needed.      Marland Kitchen spironolactone (ALDACTONE) 25 MG tablet Take 25 mg by mouth daily.       . Vitamin D, Ergocalciferol, (DRISDOL) 50000 UNITS CAPS capsule Take 50,000 Units by mouth every 7 (seven) days.       No facility-administered medications prior to visit.    PAST MEDICAL HISTORY: Past Medical History  Diagnosis Date  . Pleural effusion, right     thoracentesis 02/09/09   . CHF (congestive heart failure)     Dr. Wynonia Lawman  . Cardiomyopathy   . Hypertension     essential- benign  . Aortic valve disorder   . LBBB (left bundle branch block)     conduction disorder  . Migraines   . Osteoporosis   . Hypothyroidism   . Anxiety 01/20/11    "in the past; not now"  . Raynaud's disease   . Fibroids   . Scoliosis   . AF (paroxysmal atrial fibrillation)   . H/O: hysterectomy     secondary to uterine prolapse  . Hyperthyroidism   . ICD (implantable cardiac defibrillator) in place   . Pneumonia   . GERD (gastroesophageal reflux disease)     otc  . 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 Complication of anesthesia     aspiration  . PONV (postoperative nausea and vomiting)   . Family history of anesthesia complication     nausea  . Shortness of breath   . Arthritis     PAST SURGICAL HISTORY: Past Surgical History  Procedure Laterality Date  . Vesicovaginal fistula closure w/ tah    . Thyroid lobectomy  ~ 2006    right  . Rotator cuff repair  ~ 2009    left  . Cardiac defibrillator placement  01/20/11  . Cholecystectomy  ~1996  . Tubal ligation  ~ 1983  . Vaginal hysterectomy  ~ 2009  . Back surgery  12/31/2009    "for flat back syndrome; fused bottom of my back"  . Foot neuroma surgery  ~ 2003    right  . Skin cancer excision      nose, forehead, left leg  . Scoliosis surgery    . Shoulder arthroscopy w/ rotator cuff repair  09/21/2011  . Artery biopsy  12/29/2011    Procedure: MINOR BIOPSY TEMPORAL ARTERY;  Surgeon:  Edward Jolly, MD;  Location: [facility];  Service: General;  Laterality: Right;  Right temporal artery biopsy    FAMILY HISTORY: Family History  Problem Relation Age of Onset  . Heart disease Mother   . Heart failure Mother 23    CHF  . Stroke Mother   . Hypertension Mother   . Heart disease Sister   . Heart disease Father 36  . Macular degeneration Father   .  Atrial fibrillation Sister   . Heart disease Sister     SOCIAL HISTORY:  History   Social History  . Marital Status: Married    Spouse Name: Ronalee Belts    Number of Children: 1  . Years of Education: MA   Occupational History  . Teacher     n/a   Social History Main Topics  . Smoking status: Never Smoker   . Smokeless tobacco: Never Used  . Alcohol Use: No  . Drug Use: No  . Sexual Activity: Yes   Other Topics Concern  . Not on file   Social History Narrative   Patient lives at home with family.   Caffeine Use: 1-2 cups daily     PHYSICAL EXAM In Filed Vitals:   05/29/13 1056  BP: 98/65  Pulse: 72  Temp: 97.8 F (36.6 C)  TempSrc: Oral  Height: 5' 7.5" (1.715 m)  Weight: 152 lb (68.947 kg)    Not recorded    Body mass index is 23.44 kg/(m^2).  GENERAL EXAM: Patient is in no distress; well developed, nourished and groomed; MASKED FACIES. INTERMITTENT MILD HEAD TREMOR.  CARDIOVASCULAR: Regular rate and rhythm, no murmurs, no carotid bruits  NEUROLOGIC: MENTAL STATUS: awake, alert, oriented to person, place and time, normal attention and concentration, language fluent, comprehension intact, naming intact, fund of knowledge appropriate CRANIAL NERVE: no papilledema on fundoscopic exam, pupils equal and reactive to light, visual fields full to confrontation, extraocular muscles intact, no nystagmus, SUBJECTIVE NUMBESS IN RIGHT V2 REGION OF FACE, facial strength symmetric, hearing intact, palate elevates symmetrically, uvula midline, shoulder shrug symmetric, tongue  midline. MOTOR: normal bulk and tone, full strength in the BUE, BLE; NO TREMOR. NO RIGIDITY. MILD BRADYKINESIA IN LUE.  SENSORY: normal and symmetric to light touch COORDINATION: finger-nose-finger, fine finger movements, toe tapping SLOW THROUGHOUT REFLEXES: deep tendon reflexes present and symmetric GAIT/STATION: narrow based gait; DECR ARM SWING. SMOOTH STRIDE. SMOOTH TURNING.   DIAGNOSTIC DATA (LABS, IMAGING, TESTING) - I reviewed patient records, labs, notes, testing and imaging myself where available.  Lab Results  Component Value Date   WBC 7.6 09/14/2011   HGB 13.1 09/14/2011   HCT 38.9 09/14/2011   MCV 89.6 09/14/2011   PLT 293 09/14/2011      Component Value Date/Time   NA 138 09/14/2011 1055   K 4.3 09/14/2011 1055   CL 101 09/14/2011 1055   CO2 31 09/14/2011 1055   GLUCOSE 98 09/14/2011 1055   BUN 26* 09/14/2011 1055   CREATININE 1.22* 09/14/2011 1055   CALCIUM 9.7 09/14/2011 1055   PROT 5.7* 02/11/2010 0555   ALBUMIN 2.5* 02/11/2010 0555   AST 19 02/11/2010 0555   ALT 19 02/11/2010 0555   ALKPHOS 81 02/11/2010 0555   BILITOT 0.7 02/11/2010 0555   GFRNONAA 48* 09/14/2011 1055   GFRAA 56* 09/14/2011 1055   Lab Results  Component Value Date   CHOL  Value: 103        ATP III CLASSIFICATION:  <200     mg/dL   Desirable  200-239  mg/dL   Borderline High  >=240    mg/dL   High        02/11/2010   HDL 42 02/11/2010   LDLCALC  Value: 44        Total Cholesterol/HDL:CHD Risk Coronary Heart Disease Risk Table                     Men   Women  1/2 Average  Risk   3.4   3.3  Average Risk       5.0   4.4  2 X Average Risk   9.6   7.1  3 X Average Risk  23.4   11.0        Use the calculated Patient Ratio above and the CHD Risk Table to determine the patient's CHD Risk.        ATP III CLASSIFICATION (LDL):  <100     mg/dL   Optimal  100-129  mg/dL   Near or Above                    Optimal  130-159  mg/dL   Borderline  160-189  mg/dL   High  >190     mg/dL   Very High 02/11/2010   TRIG 85 02/11/2010   CHOLHDL 2.5 02/11/2010    No results found for this basename: HGBA1C   Lab Results  Component Value Date   VITAMINB12 302 02/12/2010   Lab Results  Component Value Date   TSH 2.531 02/08/2010    01/30/09 EMG/NCS - early recruitment of small, polyphasic motor units that suggests a myopathy.  There is no evidence of a neuropathy or radiculopathy.  Sed Rate  Date Value Range Status  02/12/2010 55* 0 - 22 mm/hr Final   12/02/11 CT head - normal   12/29/11 right temporal artery biopsy - NORMAL   ASSESSMENT AND PLAN  58 y.o. year old female here with right trigeminal neuralgia, stable. Doing well. Now with head tremor, which was noted in prior visits (as far back as 12/13/11), but slightly progressing. Likely represents essential tremor. She has some mild bradykinesia and masked facies, but no other parkinsonian features at this time.   Dx: trigeminal neuralgia vs trigeminal autonomic cephalgia  Dx: essential tremor (+/- parkinsonism)  PLAN: - continue gabapentin - monitor tremor; if progresses we can consider primidone. If features change, we may consider DATscan or trial of carb/levo  Return in about 7 months (around 12/24/2013).    Penni Bombard, MD 9/92/4268, 34:19 PM Certified in Neurology, Neurophysiology and Neuroimaging  [facility] [address] [place], [place] [address] [phone]

## 2013-05-29 NOTE — Patient Instructions (Signed)
Monitor tremor. May consider primidone in future.

## 2013-06-24 ENCOUNTER — Emergency Department (HOSPITAL_COMMUNITY): Payer: Medicare Other

## 2013-06-24 ENCOUNTER — Encounter (HOSPITAL_COMMUNITY): Payer: Self-pay | Admitting: Emergency Medicine

## 2013-06-24 ENCOUNTER — Emergency Department (HOSPITAL_COMMUNITY)
Admission: EM | Admit: 2013-06-24 | Discharge: 2013-06-24 | Disposition: A | Discharge status: Home / Self Care | Payer: Medicare Other | Attending: Emergency Medicine | Admitting: Emergency Medicine

## 2013-06-24 DIAGNOSIS — Z8639 Personal history of other endocrine, nutritional and metabolic disease: Secondary | ICD-10-CM | POA: Insufficient documentation

## 2013-06-24 DIAGNOSIS — S82832A Other fracture of upper and lower end of left fibula, initial encounter for closed fracture: Secondary | ICD-10-CM

## 2013-06-24 DIAGNOSIS — Y9389 Activity, other specified: Secondary | ICD-10-CM | POA: Insufficient documentation

## 2013-06-24 DIAGNOSIS — Y9289 Other specified places as the place of occurrence of the external cause: Secondary | ICD-10-CM | POA: Insufficient documentation

## 2013-06-24 DIAGNOSIS — Z8739 Personal history of other diseases of the musculoskeletal system and connective tissue: Secondary | ICD-10-CM | POA: Insufficient documentation

## 2013-06-24 DIAGNOSIS — Z862 Personal history of diseases of the blood and blood-forming organs and certain disorders involving the immune mechanism: Secondary | ICD-10-CM | POA: Insufficient documentation

## 2013-06-24 DIAGNOSIS — IMO0002 Reserved for concepts with insufficient information to code with codable children: Secondary | ICD-10-CM | POA: Insufficient documentation

## 2013-06-24 DIAGNOSIS — Z85828 Personal history of other malignant neoplasm of skin: Secondary | ICD-10-CM | POA: Insufficient documentation

## 2013-06-24 DIAGNOSIS — G43909 Migraine, unspecified, not intractable, without status migrainosus: Secondary | ICD-10-CM | POA: Insufficient documentation

## 2013-06-24 DIAGNOSIS — F411 Generalized anxiety disorder: Secondary | ICD-10-CM | POA: Insufficient documentation

## 2013-06-24 DIAGNOSIS — Z8719 Personal history of other diseases of the digestive system: Secondary | ICD-10-CM | POA: Insufficient documentation

## 2013-06-24 DIAGNOSIS — S82409A Unspecified fracture of shaft of unspecified fibula, initial encounter for closed fracture: Secondary | ICD-10-CM | POA: Insufficient documentation

## 2013-06-24 DIAGNOSIS — I10 Essential (primary) hypertension: Secondary | ICD-10-CM | POA: Insufficient documentation

## 2013-06-24 DIAGNOSIS — Z79899 Other long term (current) drug therapy: Secondary | ICD-10-CM | POA: Insufficient documentation

## 2013-06-24 DIAGNOSIS — W010XXA Fall on same level from slipping, tripping and stumbling without subsequent striking against object, initial encounter: Secondary | ICD-10-CM | POA: Insufficient documentation

## 2013-06-24 DIAGNOSIS — W19XXXA Unspecified fall, initial encounter: Secondary | ICD-10-CM

## 2013-06-24 DIAGNOSIS — Z8679 Personal history of other diseases of the circulatory system: Secondary | ICD-10-CM | POA: Insufficient documentation

## 2013-06-24 DIAGNOSIS — Z8701 Personal history of pneumonia (recurrent): Secondary | ICD-10-CM | POA: Insufficient documentation

## 2013-06-24 DIAGNOSIS — I509 Heart failure, unspecified: Secondary | ICD-10-CM | POA: Insufficient documentation

## 2013-06-24 DIAGNOSIS — Z95810 Presence of automatic (implantable) cardiac defibrillator: Secondary | ICD-10-CM | POA: Insufficient documentation

## 2013-06-24 MED ORDER — ONDANSETRON HCL 4 MG/2ML IJ SOLN
4.0000 mg | Freq: Once | INTRAMUSCULAR | Status: AC
  Administered 2013-06-24: 4 mg via INTRAVENOUS
  Filled 2013-06-24: qty 2

## 2013-06-24 MED ORDER — HYDROMORPHONE HCL PF 1 MG/ML IJ SOLN
0.5000 mg | Freq: Once | INTRAMUSCULAR | Status: AC
  Administered 2013-06-24: 0.5 mg via INTRAVENOUS

## 2013-06-24 MED ORDER — HYDROMORPHONE HCL PF 1 MG/ML IJ SOLN
1.0000 mg | Freq: Once | INTRAMUSCULAR | Status: DC
  Filled 2013-06-24: qty 1

## 2013-06-24 NOTE — ED Provider Notes (Signed)
  This was a shared visit with a mid-level provided (NP or PA).  Throughout the patient's course I was available for consultation/collaboration.  I saw the relevant labs and studies - I agree with the interpretation.  Most notably, the patient has a proximal fibula fracture  On my exam the patient was in no distress.  However, she wasn't comfortable.  Conversation about home wound care, orthopedics followup, return precautions      Carmin Muskrat, MD 06/24/13 1630

## 2013-06-24 NOTE — ED Notes (Signed)
Ortho Tech paged and responded for pt.

## 2013-06-24 NOTE — ED Notes (Signed)
EMS - Pt was at the top of her stairs when she lost her footing and slid down 7 steps that were carpeted.  Pt states she had LOC.  Pt has deformity to the left leg from the knee down, left and right hip pain and right ankle pain.

## 2013-06-24 NOTE — ED Provider Notes (Signed)
CSN: [id number]     Arrival date & time 06/24/13  0909 History   First MD Initiated Contact with Patient 06/24/13 [phone]     Chief Complaint  Patient presents with  . Fall     (Consider location/radiation/quality/duration/timing/severity/associated sxs/prior Treatment) HPI Comments: Patient presents to the emergency department with chief complaint of fall. She states that she slipped at the top of her stairs, and fell down approximately 7 stairs.  She states that she passed out after landing, but does not remember for how long. She complains of pain to her hips bilaterally, left leg, and right knee and right ankle. She states the pain is 3/10. She is unable to ambulate. EMS brought her to the emergency department. She denies any chest pain or shortness of breath. Denies any headaches, neck pain, or back pain. She denies any pain in her upper extremities.  The history is provided by the patient. No language interpreter was used.    Past Medical History  Diagnosis Date  . Pleural effusion, right     thoracentesis 02/09/09   . CHF (congestive heart failure)     Dr. Wynonia Lawman  . Cardiomyopathy   . Hypertension     essential- benign  . Aortic valve disorder   . LBBB (left bundle branch block)     conduction disorder  . Migraines   . Osteoporosis   . Hypothyroidism   . Anxiety 01/20/11    "in the past; not now"  . Raynaud's disease   . Fibroids   . Scoliosis   . AF (paroxysmal atrial fibrillation)   . H/O: hysterectomy     secondary to uterine prolapse  . Hyperthyroidism   . ICD (implantable cardiac defibrillator) in place   . Pneumonia   . GERD (gastroesophageal reflux disease)     otc  . 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 Complication of anesthesia     aspiration  . PONV (postoperative nausea and vomiting)   . Family history of anesthesia complication     nausea  . Shortness of breath   . Arthritis    Past Surgical History  Procedure Laterality  Date  . Vesicovaginal fistula closure w/ tah    . Thyroid lobectomy  ~ 2006    right  . Rotator cuff repair  ~ 2009    left  . Cardiac defibrillator placement  01/20/11  . Cholecystectomy  ~1996  . Tubal ligation  ~ 1983  . Vaginal hysterectomy  ~ 2009  . Back surgery  12/31/2009    "for flat back syndrome; fused bottom of my back"  . Foot neuroma surgery  ~ 2003    right  . Skin cancer excision      nose, forehead, left leg  . Scoliosis surgery    . Shoulder arthroscopy w/ rotator cuff repair  09/21/2011  . Artery biopsy  12/29/2011    Procedure: MINOR BIOPSY TEMPORAL ARTERY;  Surgeon: Edward Jolly, MD;  Location: [facility];  Service: General;  Laterality: Right;  Right temporal artery biopsy   Family History  Problem Relation Age of Onset  . Heart disease Mother   . Heart failure Mother 72    CHF  . Stroke Mother   . Hypertension Mother   . Heart disease Sister   . Heart disease Father 44  . Macular degeneration Father   . Atrial fibrillation Sister   . Heart disease Sister  History  Substance Use Topics  . Smoking status: Never Smoker   . Smokeless tobacco: Never Used  . Alcohol Use: No   OB History   Grav Para Term Preterm Abortions TAB SAB Ect Mult Living                 Review of Systems  All other systems reviewed and are negative.     Allergies  Codeine; Fentanyl; Methocarbamol; and Morphine and related  Home Medications   Prior to Admission medications   Medication Sig Start Date End Date Taking? Authorizing Provider  acetaminophen (TYLENOL) 325 MG tablet Take 650 mg by mouth every 6 (six) hours as needed for mild pain.    Yes Historical Provider, MD  alendronate (FOSAMAX) 10 MG tablet Take 10 mg by mouth once a week. Take with a full glass of water on an empty stomach. On sundays   Yes Historical Provider, MD  Calcium Citrate-Vitamin D (CITRACAL + D PO) Take 1 capsule by mouth daily.   Yes Historical Provider, MD   carvedilol (COREG) 12.5 MG tablet Take 12.5 mg by mouth 2 (two) times daily with a meal.   Yes Historical Provider, MD  eletriptan (RELPAX) 40 MG tablet One tablet by mouth as needed for migraine headache.  If the headache improves and then returns, dose may be repeated after 2 hours have elapsed since first dose (do not exceed 80 mg per day). For migraine. may repeat in 2 hours if necessary    Yes Historical Provider, MD  fluticasone (FLONASE) 50 MCG/ACT nasal spray Place 1 spray into both nostrils daily as needed for allergies or rhinitis.   Yes Historical Provider, MD  furosemide (LASIX) 40 MG tablet Take 20 mg by mouth daily.   Yes Historical Provider, MD  gabapentin (NEURONTIN) 600 MG tablet Take 1 tablet (600 mg total) by mouth 3 (three) times daily. 12/14/12  Yes Penni Bombard, MD  imipramine (TOFRANIL) 25 MG tablet Take 50 mg by mouth 2 (two) times daily.    Yes Historical Provider, MD  lisinopril (PRINIVIL,ZESTRIL) 2.5 MG tablet Take 2.5 mg by mouth daily.     Yes Historical Provider, MD  Magnesium 250 MG TABS Take 1 tablet by mouth 2 (two) times daily.   Yes Historical Provider, MD  spironolactone (ALDACTONE) 25 MG tablet Take 25 mg by mouth daily.    Yes Historical Provider, MD   BP 105/61  Pulse 72  Temp(Src) 97.6 F (36.4 C) (Oral)  Resp 17  Ht 5\' 7"  (1.702 m)  Wt 151 lb (68.493 kg)  BMI 23.64 kg/m2  SpO2 99% Physical Exam  Nursing note and vitals reviewed. Constitutional: She is oriented to person, place, and time. She appears well-developed and well-nourished.  HENT:  Head: Normocephalic and atraumatic.  No battle signs, no raccoon eyes  Eyes: Conjunctivae and EOM are normal. Pupils are equal, round, and reactive to light.  Neck: Normal range of motion. Neck supple.  Cardiovascular: Normal rate and regular rhythm.  Exam reveals no gallop and no friction rub.   No murmur heard. Pulmonary/Chest: Effort normal and breath sounds normal. No respiratory distress. She has  no wheezes. She has no rales. She exhibits no tenderness.  Pacemaker in left chest wall  Abdominal: Soft. Bowel sounds are normal. She exhibits no distension and no mass. There is no tenderness. There is no rebound and no guarding.  No focal abdominal tenderness, no RLQ tenderness or pain at McBurney's point, no RUQ tenderness or Murphy's  sign, no left-sided abdominal tenderness, no fluid wave, or signs of peritonitis   Musculoskeletal: Normal range of motion. She exhibits no edema and no tenderness.  Obvious bony deformity of the left tibia, left ankle, foot, also deformity of the right ankle, and some bruising around the right knee, no CTLS spine tenderness, step-offs, deformities, no open fractures  Neurological: She is alert and oriented to person, place, and time.  Skin: Skin is warm and dry.  Psychiatric: She has a normal mood and affect. Her behavior is normal. Judgment and thought content normal.    ED Course  Procedures (including critical care time) Results for orders placed in visit on 02/19/13  [email or web address]      Result Value Ref Range   Date Time Interrogation Session [phone]     Pulse Generator Manufacturer Medtronic     Pulse Gen Model [id number] Consulta CRT-D     Pulse Gen Serial Number [id number]     RV Sense Sensitivity 0.3     RA Pace Amplitude 1.5     RV Pace PulseWidth 0.4     RV Pace Amplitude 2     LV Pace PulseWidth 0.8     LV Pace Amplitude 2.25     LV Pace Capture Mode Adaptive Capture     Zone Setting Type Category VF     Zone Detect Interval 320     Zone Setting Type Category VT     Zone Detect Interval Blank     Zone Setting Type Category VENTRICULAR_TACHYCARDIA_1     Zone Detect Interval 360     Zone Setting Type Category VENTRICULAR_TACHYCARDIA_2     Zone Detect Interval 360     Zone Setting Type Category ATRIAL_FIBRILLATION     Zone Setting Type Category ATAF     Zone Detect Interval 350     RA Impedance 494     RA  Amplitude 1     RA Amplitude 1.75     RA Pacing Amplitude 0.75     RA Pacing PulseWidth 0.4     RV IMPEDANCE 1064     RV Amplitude 24.375     RV Amplitude 22.625     RV Pacing Amplitude 0.625     RV Pacing PulseWidth 0.4     HighPow Impedance 86     LV Impedance 1121     LV Impedance 646     LV Impedance 646     LV Pacing Amplitude 1.25     LV PACING PULSEWIDTH 0.8     Battery Status OK     Battery Voltage 3.09     Brady RA Perc Paced 0.06     Brady RV Perc Paced 99.96     Brady AP VP Percent 0.05     Brady AS VP Percent 99.91     Brady AP VS Percent 0.01     Brady AS VS Percent 0.03     Eval Rhythm NSR 70     Miscellaneous Comment       Value: CRT-D device check in office. Thresholds and sensing consistent with previous device measurements. Lead impedance trends stable over time. No mode switch episodes recorded. No ventricular arrhythmia episodes recorded. Patient bi-ventricularly pacing >99%      of the time. Device programmed with appropriate safety margins. Heart failure diagnostics reviewed and trends are stable for patient. Audible alerts demonstrated for patient. No changes made this session. Batt voltage 3.09V (ERI 2.63V). Patient will      continue  remote follow up with Dr.Tilley and F/U with [facility] in 12 months. Patient education completed including shock plan.   Dg Hip Bilateral W/pelvis  06/24/2013   CLINICAL DATA:  Fall.  Pain.  EXAM: BILATERAL HIP WITH PELVIS - 4+ VIEW  COMPARISON:  [place] film from CT scan of 04/30/2013.  FINDINGS: Patient is status post extensive lumbosacral fusion. No evidence for pelvic fracture. Irregularity at the symphysis pubis is stable. Joint space in the hips is symmetric. There is no evidence for femoral neck fracture on either side.  IMPRESSION: No acute bony findings.   Electronically Signed   By: Misty Stanley M.D.   On: 06/24/2013 10:29   Dg Femur Left  06/24/2013   CLINICAL DATA:  Status post fall with anterior low lower leg bruising  EXAM:  LEFT FEMUR - 2 VIEW  COMPARISON:  Left tibia and fibula of today's date.  FINDINGS: The femur is adequately mineralized. The hip and knee exhibit no acute abnormality. There is no acute fracture or lytic or blastic lesion or periosteal reaction. The overlying soft tissues are normal.  IMPRESSION: There is no acute bony abnormality of the left femur.   Electronically Signed   By: David  Martinique   On: 06/24/2013 10:20   Dg Tibia/fibula Left  06/24/2013   CLINICAL DATA:  Fall.  Anterior left leg and foot pain.  EXAM: LEFT TIBIA AND FIBULA - 2 VIEW  COMPARISON:  02/29/2004.  FINDINGS: Nondisplaced fracture of the proximal fibula noted. This is only evident on the lateral film. No associated tibia fracture. Bones are demineralized. There is anterior soft tissue swelling proximally and distally in the leg.  IMPRESSION: Nondisplaced fracture of the proximal fibula.   Electronically Signed   By: Misty Stanley M.D.   On: 06/24/2013 10:28   Dg Tibia/fibula Right  06/24/2013   CLINICAL DATA:  Fall.  Pain.  EXAM: RIGHT TIBIA AND FIBULA - 2 VIEW  COMPARISON:  None.  FINDINGS: There is no evidence of fracture or other focal bone lesions. Soft tissues are unremarkable.  IMPRESSION: No acute bony abnormality.   Electronically Signed   By: Misty Stanley M.D.   On: 06/24/2013 10:30   Ct Head Wo Contrast  06/24/2013   CLINICAL DATA:  [place] downstairs.  Neck pain.  EXAM: CT HEAD WITHOUT CONTRAST  CT CERVICAL SPINE WITHOUT CONTRAST  TECHNIQUE: Multidetector CT imaging of the head and cervical spine was performed following the standard protocol without intravenous contrast. Multiplanar CT image reconstructions of the cervical spine were also generated.  COMPARISON:  Head CT from 12/02/2011. Cervical spine CT from 02/29/2004. Next medial  FINDINGS: CT HEAD FINDINGS  There is no evidence for acute hemorrhage, hydrocephalus, mass lesion, or abnormal extra-axial fluid collection. No definite CT evidence for acute infarction. The  visualized portions of the maxillary sinuses are clear. No evidence for mastoid fluid. Chronic mucosal disease in posterior right ethmoid air cells is stable. Similarly, mucosal thickening in the left sphenoid sinus with hypoplastic right sphenoid sinus is unchanged. No evidence for skull fracture.  CT CERVICAL SPINE FINDINGS  Imaging was obtained from the skullbase through the T1-2 interspace. No evidence for fracture. Loss of disc height with endplate degeneration is seen at C5-6 and to a lesser degree at C6-7. Prominent left-sided facet osteoarthritis is seen at C3-4 on the right. No evidence for prevertebral soft tissue swelling.  IMPRESSION: No acute intracranial abnormality.  Chronic ethmoid and sphenoid sinus disease.  No evidence for cervical spine fracture with  chronic degenerative disc disease at see 5 6 and C6-7.   Electronically Signed   By: Misty Stanley M.D.   On: 06/24/2013 10:54   Ct Cervical Spine Wo Contrast  06/24/2013   CLINICAL DATA:  [place] downstairs.  Neck pain.  EXAM: CT HEAD WITHOUT CONTRAST  CT CERVICAL SPINE WITHOUT CONTRAST  TECHNIQUE: Multidetector CT imaging of the head and cervical spine was performed following the standard protocol without intravenous contrast. Multiplanar CT image reconstructions of the cervical spine were also generated.  COMPARISON:  Head CT from 12/02/2011. Cervical spine CT from 02/29/2004. Next medial  FINDINGS: CT HEAD FINDINGS  There is no evidence for acute hemorrhage, hydrocephalus, mass lesion, or abnormal extra-axial fluid collection. No definite CT evidence for acute infarction. The visualized portions of the maxillary sinuses are clear. No evidence for mastoid fluid. Chronic mucosal disease in posterior right ethmoid air cells is stable. Similarly, mucosal thickening in the left sphenoid sinus with hypoplastic right sphenoid sinus is unchanged. No evidence for skull fracture.  CT CERVICAL SPINE FINDINGS  Imaging was obtained from the skullbase through  the T1-2 interspace. No evidence for fracture. Loss of disc height with endplate degeneration is seen at C5-6 and to a lesser degree at C6-7. Prominent left-sided facet osteoarthritis is seen at C3-4 on the right. No evidence for prevertebral soft tissue swelling.  IMPRESSION: No acute intracranial abnormality.  Chronic ethmoid and sphenoid sinus disease.  No evidence for cervical spine fracture with chronic degenerative disc disease at see 5 6 and C6-7.   Electronically Signed   By: Misty Stanley M.D.   On: 06/24/2013 10:54   Dg Foot Complete Left  06/24/2013   CLINICAL DATA:  Fall with pain.  EXAM: LEFT FOOT - COMPLETE 3+ VIEW  COMPARISON:  None.  FINDINGS: There is no evidence of fracture or dislocation. There is no evidence of arthropathy or other focal bone abnormality. Soft tissues are unremarkable.  IMPRESSION: No acute bony abnormality.   Electronically Signed   By: Misty Stanley M.D.   On: 06/24/2013 10:30      EKG Interpretation None      MDM   Final diagnoses:  Fall  Fracture of left proximal fibula    The patient with fall down 7 stairs. Multiple large contusions, will order appropriate imaging.  Imaging is largely negative , however there is a nondisplaced left-sided proximal fibula fracture, which will be treated with a knee immobilizer and crutches. Patient is to be nonweightbearing on the affected extremity. She is able to stand, and does not complain of any hip pain. Recommend orthopedic followup in 7-10 days. Patient discussed with Dr. Vanita Panda, who agrees with the plan.    Montine Circle, PA-C 06/24/13 1243

## 2013-06-24 NOTE — Progress Notes (Signed)
Orthopedic Tech Progress Note Patient Details:  [name] 1955-02-26 [id number] KI applied to LLE. Patient unable to use crutches stating she has had multiple back surgeries and is not balanced enough to do so. Nurse notified. Ortho Devices Type of Ortho Device: Knee Immobilizer Ortho Device/Splint Location: LLE Ortho Device/Splint Interventions: Application   Asia R Thompson 06/24/2013, 12:19 PM

## 2013-06-24 NOTE — ED Notes (Signed)
PA Browning at bedside. 

## 2013-06-24 NOTE — Discharge Instructions (Signed)
Fall Prevention and Home Safety Falls cause injuries and can affect all age groups. It is possible to use preventive measures to significantly decrease the likelihood of falls. There are many simple measures which can make your home safer and prevent falls. OUTDOORS  Repair cracks and edges of walkways and driveways.  Remove high doorway thresholds.  Trim shrubbery on the main path into your home.  Have good outside lighting.  Clear walkways of tools, rocks, debris, and clutter.  Check that handrails are not broken and are securely fastened. Both sides of steps should have handrails.  Have leaves, snow, and ice cleared regularly.  Use sand or salt on walkways during winter months.  In the garage, clean up grease or oil spills. BATHROOM  Install night lights.  Install grab bars by the toilet and in the tub and shower.  Use non-skid mats or decals in the tub or shower.  Place a plastic non-slip stool in the shower to sit on, if needed.  Keep floors dry and clean up all water on the floor immediately.  Remove soap buildup in the tub or shower on a regular basis.  Secure bath mats with non-slip, double-sided rug tape.  Remove throw rugs and tripping hazards from the floors. BEDROOMS  Install night lights.  Make sure a bedside light is easy to reach.  Do not use oversized bedding.  Keep a telephone by your bedside.  Have a firm chair with side arms to use for getting dressed.  Remove throw rugs and tripping hazards from the floor. KITCHEN  Keep handles on pots and pans turned toward the center of the stove. Use back burners when possible.  Clean up spills quickly and allow time for drying.  Avoid walking on wet floors.  Avoid hot utensils and knives.  Position shelves so they are not too high or low.  Place commonly used objects within easy reach.  If necessary, use a sturdy step stool with a grab bar when reaching.  Keep electrical cables out of the  way.  Do not use floor polish or wax that makes floors slippery. If you must use wax, use non-skid floor wax.  Remove throw rugs and tripping hazards from the floor. STAIRWAYS  Never leave objects on stairs.  Place handrails on both sides of stairways and use them. Fix any loose handrails. Make sure handrails on both sides of the stairways are as long as the stairs.  Check carpeting to make sure it is firmly attached along stairs. Make repairs to worn or loose carpet promptly.  Avoid placing throw rugs at the top or bottom of stairways, or properly secure the rug with carpet tape to prevent slippage. Get rid of throw rugs, if possible.  Have an electrician put in a light switch at the top and bottom of the stairs. OTHER FALL PREVENTION TIPS  Wear low-heel or rubber-soled shoes that are supportive and fit well. Wear closed toe shoes.  When using a stepladder, make sure it is fully opened and both spreaders are firmly locked. Do not climb a closed stepladder.  Add color or contrast paint or tape to grab bars and handrails in your home. Place contrasting color strips on first and last steps.  Learn and use mobility aids as needed. Install an electrical emergency response system.  Turn on lights to avoid dark areas. Replace light bulbs that burn out immediately. Get light switches that glow.  Arrange furniture to create clear pathways. Keep furniture in the same place.  Firmly attach carpet with non-skid or double-sided tape.  Eliminate uneven floor surfaces.  Select a carpet pattern that does not visually hide the edge of steps.  Be aware of all pets. OTHER HOME SAFETY TIPS  Set the water temperature for 120 F (48.8 C).  Keep emergency numbers on or near the telephone.  Keep smoke detectors on every level of the home and near sleeping areas. Document Released: 12/17/2001 Document Revised: 06/28/2011 Document Reviewed: 03/18/2011 [facility] Patient Information 2014  [place]. Fibular Fracture, Adult, Treated Without Immobilization You have a fracture (break) of your fibula. This is the bone in your lower leg located on the outside of the leg. These fractures are easily diagnosed with x-rays. TREATMENT  You have a simple fracture of the part of the fibula which is located between the knee and ankle. This bone usually will heal without problems and can often be treated without casting or splinting. This means the fracture will heal well during normal use and daily activities without being held in place. Sometimes a cast or splint is placed on these fractures if it is needed for comfort or if the bones are badly out of place. HOME CARE INSTRUCTIONS   Apply ice to the injury for 15-20 minutes, 03-04 times per day while awake, for 2 days. Put the ice in a plastic bag and place a thin towel between the bag of ice and your leg. This helps keep swelling down.  Use crutches as directed. Resume walking without crutches as directed by your caregiver or when comfortable doing so.  Only take over-the-counter or prescription medicines for pain, discomfort, or fever as directed by your caregiver.  Your caregiver may tell you to take off your removable cam boot.  Keep appointments for follow up X-rays if these are required.  Warning: Do not drive a car or operate a motor vehicle until your caregiver specifically tells you it is safe to do so. SEEK MEDICAL CARE IF:   You have continued severe pain or more swelling.  The medications do not control the pain.  Your skin or nails below the injury turn blue or grey or feel cold or numb.  You develop severe pain in the leg or foot. MAKE SURE YOU:   Understand these instructions.  Will watch your condition.  Will get help right away if you are not doing well or get worse. Document Released: 12/27/2004 Document Revised: 03/21/2011 Document Reviewed: 08/03/2007 [facility] Patient Information 2014 [place].

## 2013-06-24 NOTE — ED Notes (Signed)
Pt able to stand without pain in the hips. States that she normally uses a walker at home.

## 2013-07-01 ENCOUNTER — Other Ambulatory Visit: Payer: Self-pay | Admitting: Internal Medicine

## 2013-07-03 ENCOUNTER — Other Ambulatory Visit: Payer: Self-pay

## 2013-07-03 MED ORDER — CARVEDILOL 12.5 MG PO TABS: 12.5000 mg | ORAL_TABLET | Freq: Two times a day (BID) | ORAL | Status: DC

## 2013-09-29 ENCOUNTER — Other Ambulatory Visit: Payer: Self-pay | Admitting: Internal Medicine

## 2013-11-20 ENCOUNTER — Telehealth: Payer: Self-pay | Admitting: Endocrinology

## 2013-11-20 NOTE — Telephone Encounter (Signed)
Pt calling to speak with you would not discuss reasoning

## 2013-12-16 ENCOUNTER — Ambulatory Visit (INDEPENDENT_AMBULATORY_CARE_PROVIDER_SITE_OTHER): Payer: Medicare Other | Admitting: Diagnostic Neuroimaging

## 2013-12-16 ENCOUNTER — Encounter: Payer: Self-pay | Admitting: Diagnostic Neuroimaging

## 2013-12-16 VITALS — BP 104/68 | HR 76 | Temp 97.5°F | Ht 67.5 in | Wt 159.6 lb

## 2013-12-16 DIAGNOSIS — G500 Trigeminal neuralgia: Secondary | ICD-10-CM

## 2013-12-16 DIAGNOSIS — G250 Essential tremor: Secondary | ICD-10-CM

## 2013-12-16 DIAGNOSIS — G243 Spasmodic torticollis: Secondary | ICD-10-CM

## 2013-12-16 NOTE — Patient Instructions (Signed)
Monitor symptoms. 

## 2013-12-16 NOTE — Progress Notes (Signed)
[facility]  PATIENT: [name] DOB: [birth year]  REFERRING CLINICIAN:  HISTORY FROM: patient REASON FOR VISIT: follow up   HISTORICAL  CHIEF COMPLAINT:  Chief Complaint  Patient presents with  . Follow-up          HISTORY OF PRESENT ILLNESS:   UPDATE 12/16/13: Since last visit, facial pain is stable. Head tremor is stable. Mild tremor in right hand when applying makeup. Gait and swallowing normal.  UPDATE 05/29/13: Facial pain is stable, except when she self-tapered gabapentin evening dose and she had more right face pain. Now on TID dosing and stable. Also, her son and husand have noticed head tremor. She is concerned about this and self-conscious at church now. Patient's sister has hand tremor, diagnosed as essential tremor and on propranolol. Her father had hand tremor. Patient's maternal grandmother and maternal cousin had head tremor. Patient denies any smell or taste difficulty, constipation, hand tremor, new balance or gait difficulty. Chronic balance issues are present from low back pain, surgery and left leg numbness.  UPDATE 12/14/12: Since last visit patient is doing well. Headache and trigeminal neuralgia pain symptoms are stable. Tolerating gabapentin.  UPDATE 03/26/12: Since last visit, right temporal artery biopsy done and was normal. Symptoms better on gabapentin 600mg  TID. Some residual numbness in right face. Pain is much better. Tried BID gabapentin, but pain increased.   PRIOR HPI (01/21/09): 58 year old female with history of CHF, AICD placement, back surgery, thyroid surgery, migraine, here for evaluation of right-sided headache, eye pain, jaw pain, right facial numbness. In symptoms started in November 2013. I previously saw patient for a different problem consisting of lower extremity muscle pain and cramps. Patient describes right eye pain, nausea, headache, right facial numbness. She describes a stabbing sensation in her right eye. She  had some photophobia and phonophobia. Patient into the ER multiple times and treated with "headache cocktails" with mild relief. She's been tried on gabapentin, tramadol, Zofran, carbamazepine, prednisone Dosepak with mild relief. CT head in ER was unremarkable.  REVIEW OF SYSTEMS: Full 14 system review of systems performed and notable only for ringing in ears eye redness head tremor.   ALLERGIES: Allergies  Allergen Reactions  . Codeine Other (See Comments)    Chest pain  . Fentanyl Other (See Comments)    Bad dreams  . Methocarbamol Other (See Comments)    Chest pain.  Marland Kitchen Morphine And Related     HOME MEDICATIONS: Outpatient Prescriptions Prior to Visit  Medication Sig Dispense Refill  . acetaminophen (TYLENOL) 325 MG tablet Take 650 mg by mouth every 6 (six) hours as needed for mild pain.     Marland Kitchen alendronate (FOSAMAX) 10 MG tablet Take 10 mg by mouth once a week. Take with a full glass of water on an empty stomach. On sundays    . eletriptan (RELPAX) 40 MG tablet One tablet by mouth as needed for migraine headache.  If the headache improves and then returns, dose may be repeated after 2 hours have elapsed since first dose (do not exceed 80 mg per day). For migraine. may repeat in 2 hours if necessary     . fexofenadine (ALLEGRA) 180 MG tablet Take 180 mg by mouth daily as needed for allergies or rhinitis.    . fluticasone (FLONASE) 50 MCG/ACT nasal spray Place 1 spray into both nostrils daily as needed for allergies or rhinitis.    . furosemide (LASIX) 40 MG tablet Take 20 mg by mouth daily.    Marland Kitchen  gabapentin (NEURONTIN) 600 MG tablet Take 1 tablet (600 mg total) by mouth 3 (three) times daily. 270 tablet 4  . imipramine (TOFRANIL) 25 MG tablet Take 50 mg by mouth 2 (two) times daily.     Marland Kitchen levothyroxine (SYNTHROID, LEVOTHROID) 112 MCG tablet Take 112 mcg by mouth daily before breakfast.    . lisinopril (PRINIVIL,ZESTRIL) 2.5 MG tablet Take 2.5 mg by mouth daily.      . Magnesium 250 MG  TABS Take 1 tablet by mouth 2 (two) times daily.    Marland Kitchen spironolactone (ALDACTONE) 25 MG tablet Take 12.5 mg by mouth daily.     . Calcium Citrate-Vitamin D (CITRACAL + D PO) Take 1 capsule by mouth daily.    . carvedilol (COREG) 6.25 MG tablet TAKE 1 TABLET BY MOUTH TWICE A DAY 180 tablet 0   No facility-administered medications prior to visit.    PAST MEDICAL HISTORY: Past Medical History  Diagnosis Date  . Pleural effusion, right     thoracentesis 02/09/09   . CHF (congestive heart failure)     Dr. Wynonia Lawman  . Cardiomyopathy   . Hypertension     essential- benign  . Aortic valve disorder   . LBBB (left bundle branch block)     conduction disorder  . Migraines   . Osteoporosis   . Hypothyroidism   . Anxiety 01/20/11    "in the past; not now"  . Raynaud's disease   . Fibroids   . Scoliosis   . AF (paroxysmal atrial fibrillation)   . H/O: hysterectomy     secondary to uterine prolapse  . Hyperthyroidism   . ICD (implantable cardiac defibrillator) in place   . Pneumonia   . GERD (gastroesophageal reflux disease)     otc  . 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 Complication of anesthesia     aspiration  . PONV (postoperative nausea and vomiting)   . Family history of anesthesia complication     nausea  . Shortness of breath   . Arthritis     PAST SURGICAL HISTORY: Past Surgical History  Procedure Laterality Date  . Vesicovaginal fistula closure w/ tah    . Thyroid lobectomy  ~ 2006    right  . Rotator cuff repair  ~ 2009    left  . Cardiac defibrillator placement  01/20/11  . Cholecystectomy  ~1996  . Tubal ligation  ~ 1983  . Vaginal hysterectomy  ~ 2009  . Back surgery  12/31/2009    "for flat back syndrome; fused bottom of my back"  . Foot neuroma surgery  ~ 2003    right  . Skin cancer excision      nose, forehead, left leg  . Scoliosis surgery    . Shoulder arthroscopy w/ rotator cuff repair  09/21/2011  . Artery biopsy   12/29/2011    Procedure: MINOR BIOPSY TEMPORAL ARTERY;  Surgeon: Edward Jolly, MD;  Location: [facility];  Service: General;  Laterality: Right;  Right temporal artery biopsy    FAMILY HISTORY: Family History  Problem Relation Age of Onset  . Heart disease Mother   . Heart failure Mother 67    CHF  . Stroke Mother   . Hypertension Mother   . Heart disease Sister   . Heart disease Father 75  . Macular degeneration Father   . Atrial fibrillation Sister   . Heart disease Sister  SOCIAL HISTORY:  History   Social History  . Marital Status: Married    Spouse Name: Ronalee Belts    Number of Children: 1  . Years of Education: MA   Occupational History  . Teacher     n/a   Social History Main Topics  . Smoking status: Never Smoker   . Smokeless tobacco: Never Used  . Alcohol Use: No  . Drug Use: No  . Sexual Activity: Yes   Other Topics Concern  . Not on file   Social History Narrative   Patient lives at home with family.   Caffeine Use: 1-2 cups daily     PHYSICAL EXAM In Filed Vitals:   12/16/13 1417  BP: 104/68  Pulse: 76  Temp: 97.5 F (36.4 C)  TempSrc: Oral  Height: 5' 7.5" (1.715 m)  Weight: 159 lb 9.6 oz (72.394 kg)    Not recorded      Body mass index is 24.61 kg/(m^2).  GENERAL EXAM: Patient is in no distress; well developed, nourished and groomed; MASKED FACIES. INTERMITTENT MILD HEAD TREMOR. HEAD SLIGHTLY ROTATED TO THE RIGHT.   CARDIOVASCULAR: Regular rate and rhythm, no murmurs, no carotid bruits  NEUROLOGIC: MENTAL STATUS: awake, alert, oriented to person, place and time, normal attention and concentration, language fluent, comprehension intact, naming intact, fund of knowledge appropriate CRANIAL NERVE: pupils equal and reactive to light, visual fields full to confrontation, extraocular muscles intact, no nystagmus, SUBJECTIVE NUMBESS IN RIGHT V2 REGION OF FACE, facial strength symmetric, hearing intact, palate  elevates symmetrically, uvula midline, shoulder shrug symmetric, tongue midline. MOTOR: normal bulk and tone, full strength in the BUE, BLE; NO TREMOR. NO RIGIDITY. MILD BRADYKINESIA IN LUE.  SENSORY: normal and symmetric to light touch COORDINATION: finger-nose-finger, fine finger movements, toe tapping SLOW THROUGHOUT REFLEXES: deep tendon reflexes present and symmetric GAIT/STATION: narrow based gait; DECR ARM SWING. SMOOTH STRIDE. SMOOTH TURNING.   DIAGNOSTIC DATA (LABS, IMAGING, TESTING) - I reviewed patient records, labs, notes, testing and imaging myself where available.  Lab Results  Component Value Date   WBC 7.6 09/14/2011   HGB 13.1 09/14/2011   HCT 38.9 09/14/2011   MCV 89.6 09/14/2011   PLT 293 09/14/2011      Component Value Date/Time   NA 138 09/14/2011 1055   K 4.3 09/14/2011 1055   CL 101 09/14/2011 1055   CO2 31 09/14/2011 1055   GLUCOSE 98 09/14/2011 1055   BUN 26* 09/14/2011 1055   CREATININE 1.22* 09/14/2011 1055   CALCIUM 9.7 09/14/2011 1055   PROT 5.7* 02/11/2010 0555   ALBUMIN 2.5* 02/11/2010 0555   AST 19 02/11/2010 0555   ALT 19 02/11/2010 0555   ALKPHOS 81 02/11/2010 0555   BILITOT 0.7 02/11/2010 0555   GFRNONAA 48* 09/14/2011 1055   GFRAA 56* 09/14/2011 1055   Lab Results  Component Value Date   CHOL  02/11/2010    103        ATP III CLASSIFICATION:  <200     mg/dL   Desirable  200-239  mg/dL   Borderline High  >=240    mg/dL   High          HDL 42 02/11/2010   LDLCALC  02/11/2010    44        Total Cholesterol/HDL:CHD Risk Coronary Heart Disease Risk Table                     Men   Women  1/2 Average Risk  3.4   3.3  Average Risk       5.0   4.4  2 X Average Risk   9.6   7.1  3 X Average Risk  23.4   11.0        Use the calculated Patient Ratio above and the CHD Risk Table to determine the patient's CHD Risk.        ATP III CLASSIFICATION (LDL):  <100     mg/dL   Optimal  100-129  mg/dL   Near or Above                     Optimal  130-159  mg/dL   Borderline  160-189  mg/dL   High  >190     mg/dL   Very High   TRIG 85 02/11/2010   CHOLHDL 2.5 02/11/2010   No results found for: HGBA1C Lab Results  Component Value Date   VITAMINB12 302 02/12/2010   Lab Results  Component Value Date   TSH 2.531 02/08/2010    01/30/09 EMG/NCS - early recruitment of small, polyphasic motor units that suggests a myopathy.  There is no evidence of a neuropathy or radiculopathy.  Sed Rate  Date Value Range Status  02/12/2010 55* 0 - 22 mm/hr Final   12/02/11 CT head - normal   12/29/11 right temporal artery biopsy - NORMAL   ASSESSMENT AND PLAN  58 y.o. year old female here with right trigeminal neuralgia, stable. Doing well. Now with head tremor, which was noted in prior visits (as far back as 12/13/11), but slightly progressing. 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 She has some mild bradykinesia and masked facies, but no other parkinsonian features at this time.   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 If parkinson's features increase, we may consider DATscan or trial of carb/levo  Return in about 1 year (around 12/17/2014).    Penni Bombard, MD 09/12/2353, 7:32 PM Certified in Neurology, Neurophysiology and Neuroimaging  [facility] [address] [place], [place] [address] [phone]

## 2013-12-19 ENCOUNTER — Encounter (HOSPITAL_COMMUNITY): Payer: Self-pay | Admitting: Internal Medicine

## 2013-12-27 ENCOUNTER — Other Ambulatory Visit: Payer: Self-pay | Admitting: Internal Medicine

## 2014-01-22 DIAGNOSIS — M545 Low back pain: Secondary | ICD-10-CM | POA: Diagnosis not present

## 2014-01-22 DIAGNOSIS — R0781 Pleurodynia: Secondary | ICD-10-CM | POA: Diagnosis not present

## 2014-01-24 DIAGNOSIS — L259 Unspecified contact dermatitis, unspecified cause: Secondary | ICD-10-CM | POA: Diagnosis not present

## 2014-01-24 DIAGNOSIS — K649 Unspecified hemorrhoids: Secondary | ICD-10-CM | POA: Diagnosis not present

## 2014-01-27 ENCOUNTER — Telehealth: Payer: Self-pay | Admitting: Diagnostic Neuroimaging

## 2014-01-27 NOTE — Telephone Encounter (Signed)
Patient indicates her tremors are becoming more frequent and would like to know if she could be prescribed Primidone to see if that will help.  Please advise.  Thank you.

## 2014-01-27 NOTE — Telephone Encounter (Signed)
Patient is calling because she is having  more frequent essential tremors in head and feels she might need a medication sent to [facility].  Please call.

## 2014-01-28 MED ORDER — PRIMIDONE 50 MG PO TABS: 50.0000 mg | ORAL_TABLET | Freq: Every day | ORAL | Status: DC

## 2014-01-28 NOTE — Telephone Encounter (Signed)
Start priomidone 50mg  at bedtime for 1 month. Let patient know. I sent in rx. -VRP

## 2014-01-28 NOTE — Telephone Encounter (Signed)
I called back to advise.  Got no answer.  Left message.

## 2014-02-05 ENCOUNTER — Telehealth: Payer: Self-pay | Admitting: *Deleted

## 2014-02-05 NOTE — Telephone Encounter (Signed)
Spoke with the pt on the phone after talking to Dr. Leta Baptist about her primidone issues. Per Dr. Leta Baptist, he wants her to try 1/2 a tablet of primidone at night before bed and see if that helps with the tremors. If not, then she would need to talk to Dr. Lovena Le about switching to propanalol instead of coreg since she has a lot of preexisting heart conditions. Pt stated an appreciation and understanding.

## 2014-02-05 NOTE — Telephone Encounter (Signed)
Spoke to pt on the phone about rescheduling the follow-up appt. Was able to do this. Pt asked about the primidone, wondering if Dr. Leta Baptist could prescribe something else due to "the horrible side effects". Pt stated that it made her feel drunk. Told pt that I would speak with Dr. Leta Baptist and find out if something could be called in for her and that I would get back to her.

## 2014-02-06 ENCOUNTER — Telehealth: Payer: Self-pay | Admitting: Internal Medicine

## 2014-02-06 NOTE — Telephone Encounter (Signed)
New problem   Pt stated she lifted a trash can yesterday and now her defib doesn't feel right. Pt want to come in office today to be check. Please advise pt.

## 2014-02-06 NOTE — Telephone Encounter (Signed)
Scheduled pt for 02-07-14 @ 1400 with GT. Due for yearly check as well. Pt complains of feeling "bumps" at ICD site.

## 2014-02-07 ENCOUNTER — Encounter: Payer: Self-pay | Admitting: Internal Medicine

## 2014-02-07 ENCOUNTER — Ambulatory Visit (INDEPENDENT_AMBULATORY_CARE_PROVIDER_SITE_OTHER): Payer: Medicare Other | Admitting: Internal Medicine

## 2014-02-07 VITALS — BP 100/68 | HR 73 | Ht 67.5 in | Wt 160.0 lb

## 2014-02-07 DIAGNOSIS — Z95810 Presence of automatic (implantable) cardiac defibrillator: Secondary | ICD-10-CM

## 2014-02-07 DIAGNOSIS — I5022 Chronic systolic (congestive) heart failure: Secondary | ICD-10-CM

## 2014-02-07 DIAGNOSIS — I429 Cardiomyopathy, unspecified: Secondary | ICD-10-CM | POA: Diagnosis not present

## 2014-02-07 DIAGNOSIS — I428 Other cardiomyopathies: Secondary | ICD-10-CM

## 2014-02-07 LAB — MDC_IDC_ENUM_SESS_TYPE_INCLINIC
Battery Voltage: 3.02 V
Brady Statistic AP VP Percent: 0.03 %
Brady Statistic AP VS Percent: 0.01 %
Brady Statistic AS VP Percent: 99.92 %
Brady Statistic AS VS Percent: 0.04 %
Brady Statistic RA Percent Paced: 0.04 %
Brady Statistic RV Percent Paced: 99.95 %
Date Time Interrogation Session: 20160129202952
HighPow Impedance: 88 Ohm
Lead Channel Impedance Value: 1045 Ohm
Lead Channel Impedance Value: 380 Ohm
Lead Channel Impedance Value: 551 Ohm
Lead Channel Impedance Value: 646 Ohm
Lead Channel Impedance Value: 988 Ohm
Lead Channel Pacing Threshold Amplitude: 0.625 V
Lead Channel Pacing Threshold Amplitude: 0.625 V
Lead Channel Pacing Threshold Amplitude: 1.25 V
Lead Channel Pacing Threshold Pulse Width: 0.4 ms
Lead Channel Pacing Threshold Pulse Width: 0.4 ms
Lead Channel Pacing Threshold Pulse Width: 0.8 ms
Lead Channel Sensing Intrinsic Amplitude: 0.875 mV
Lead Channel Sensing Intrinsic Amplitude: 2.875 mV
Lead Channel Sensing Intrinsic Amplitude: 23.375 mV
Lead Channel Sensing Intrinsic Amplitude: 24.375 mV
Lead Channel Setting Pacing Amplitude: 1.5 V
Lead Channel Setting Pacing Amplitude: 2 V
Lead Channel Setting Pacing Amplitude: 2.25 V
Lead Channel Setting Pacing Pulse Width: 0.4 ms
Lead Channel Setting Pacing Pulse Width: 0.8 ms
Lead Channel Setting Sensing Sensitivity: 0.3 mV
Zone Setting Detection Interval: 320 ms
Zone Setting Detection Interval: 350 ms
Zone Setting Detection Interval: 360 ms
Zone Setting Detection Interval: 360 ms

## 2014-02-07 NOTE — Assessment & Plan Note (Signed)
Her symptoms remain class 2A. She will continue her current medical therapy.

## 2014-02-07 NOTE — Progress Notes (Signed)
HPI [name] returns today for followup. She is a very pleasant 59 year old woman with a nonischemic cardiomyopathy, chronic systolic heart failure, left bundle branch block, status post biventricular ICD implantation. In the interim, she has been stable. She notes that her CHF symptoms are actually improved.  She has had no frank syncope and no ICD shocks. She notes that she was lifting a trash can and thought that her device might have moved.  Allergies  Allergen Reactions  . Codeine Other (See Comments)    Chest pain  . Fentanyl Other (See Comments)    Bad dreams  . Methocarbamol Other (See Comments)    Chest pain.  Marland Kitchen Morphine And Related      Current Outpatient Prescriptions  Medication Sig Dispense Refill  . acetaminophen (TYLENOL) 325 MG tablet Take 650 mg by mouth every 6 (six) hours as needed for mild pain.     Marland Kitchen alendronate (FOSAMAX) 10 MG tablet Take 10 mg by mouth once a week. Take with a full glass of water on an empty stomach. On sundays    . carvedilol (COREG) 3.125 MG tablet Take 3.125 mg by mouth 2 (two) times daily.  12  . eletriptan (RELPAX) 40 MG tablet Take 40 mg by mouth daily as needed. For migraine. may repeat in 2 hours if necessary    . fluticasone (FLONASE) 50 MCG/ACT nasal spray Place 1 spray into both nostrils daily as needed for allergies or rhinitis.    . furosemide (LASIX) 40 MG tablet Take 20 mg by mouth daily.    Marland Kitchen gabapentin (NEURONTIN) 600 MG tablet Take 1 tablet (600 mg total) by mouth 3 (three) times daily. 270 tablet 4  . imipramine (TOFRANIL) 25 MG tablet Take 75 mg by mouth at bedtime.     Marland Kitchen levothyroxine (SYNTHROID, LEVOTHROID) 112 MCG tablet Take 112 mcg by mouth daily before breakfast.    . lisinopril (PRINIVIL,ZESTRIL) 2.5 MG tablet Take 2.5 mg by mouth daily.      . Magnesium 250 MG TABS Take 1 tablet by mouth 2 (two) times daily.    . primidone (MYSOLINE) 50 MG tablet Take 1 tablet (50 mg total) by mouth at bedtime. 30 tablet 6  .  spironolactone (ALDACTONE) 25 MG tablet Take 12.5 mg by mouth daily.      No current facility-administered medications for this visit.     Past Medical History  Diagnosis Date  . Pleural effusion, right     thoracentesis 02/09/09   . CHF (congestive heart failure)     Dr. Wynonia Lawman  . Cardiomyopathy   . Hypertension     essential- benign  . Aortic valve disorder   . LBBB (left bundle branch block)     conduction disorder  . Migraines   . Osteoporosis   . Hypothyroidism   . Anxiety 01/20/11    "in the past; not now"  . Raynaud's disease   . Fibroids   . Scoliosis   . AF (paroxysmal atrial fibrillation)   . H/O: hysterectomy     secondary to uterine prolapse  . Hyperthyroidism   . ICD (implantable cardiac defibrillator) in place   . Pneumonia   . GERD (gastroesophageal reflux disease)     otc  . 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 Complication of anesthesia     aspiration  . PONV (postoperative nausea and vomiting)   . Family history of anesthesia complication     nausea  .  Shortness of breath   . Arthritis     ROS:   All systems reviewed and negative except as noted in the HPI.   Past Surgical History  Procedure Laterality Date  . Vesicovaginal fistula closure w/ tah    . Thyroid lobectomy  ~ 2006    right  . Rotator cuff repair  ~ 2009    left  . Cardiac defibrillator placement  01/20/11  . Cholecystectomy  ~1996  . Tubal ligation  ~ 1983  . Vaginal hysterectomy  ~ 2009  . Back surgery  12/31/2009    "for flat back syndrome; fused bottom of my back"  . Foot neuroma surgery  ~ 2003    right  . Skin cancer excision      nose, forehead, left leg  . Scoliosis surgery    . Shoulder arthroscopy w/ rotator cuff repair  09/21/2011  . Artery biopsy  12/29/2011    Procedure: MINOR BIOPSY TEMPORAL ARTERY;  Surgeon: Edward Jolly, MD;  Location: [facility];  Service: General;  Laterality: Right;  Right temporal  artery biopsy  . Bi-ventricular implantable cardioverter defibrillator Left 01/20/2011    Procedure: BI-VENTRICULAR IMPLANTABLE CARDIOVERTER DEFIBRILLATOR  (CRT-D);  Surgeon: Evans Lance, MD;  Location: [facility] LAB;  Service: Cardiovascular;  Laterality: Left;     Family History  Problem Relation Age of Onset  . Heart disease Mother   . Heart failure Mother 42    CHF  . Stroke Mother   . Hypertension Mother   . Heart disease Sister   . Heart disease Father 54  . Macular degeneration Father   . Atrial fibrillation Sister   . Heart disease Sister      History   Social History  . Marital Status: Married    Spouse Name: Ronalee Belts    Number of Children: 1  . Years of Education: MA   Occupational History  . Teacher     n/a   Social History Main Topics  . Smoking status: Never Smoker   . Smokeless tobacco: Never Used  . Alcohol Use: No  . Drug Use: No  . Sexual Activity: Yes   Other Topics Concern  . Not on file   Social History Narrative   Patient lives at home with family.   Caffeine Use: 1-2 cups daily     BP 100/68 mmHg  Pulse 73  Ht 5' 7.5" (1.715 m)  Wt 160 lb (72.576 kg)  BMI 24.68 kg/m2  Physical Exam:  Well appearing middle-aged woman, NAD HEENT: Unremarkable Neck:  6 cm JVD, no thyromegally Lungs:  Clear with no wheezes, rales, or rhonchi. Well-healed ICD incision. No evidence of device migration. HEART:  Regular rate rhythm, no murmurs, no rubs, no clicks Abd:  soft, positive bowel sounds, no organomegally, no rebound, no guarding Ext:  2 plus pulses, no edema, no cyanosis, no clubbing Skin:  No rashes no nodules Neuro:  CN II through XII intact, motor grossly intact  ECG - NSR with Biv pacing  DEVICE  Normal device function.  See PaceArt for details.   Assess/Plan:

## 2014-02-07 NOTE — Patient Instructions (Signed)
Your physician wants you to follow-up in: 1 year with Dr. Lovena Le. You will receive a reminder letter in the mail two months in advance. If you don't receive a letter, please call our office to schedule the follow-up appointment.  Your physician recommends that you continue on your current medications as directed. Please refer to the Current Medication list given to you today.

## 2014-02-07 NOTE — Assessment & Plan Note (Signed)
Her Medtronic BiV ICD is working normally and her pocket looks good. She will undergo watchful waiting.

## 2014-02-10 NOTE — Telephone Encounter (Signed)
Pt calling back to inform Dr. Leta Baptist that she did take 1/2 primidone and it helped a lot.  Just wanted you to know.

## 2014-02-13 ENCOUNTER — Other Ambulatory Visit: Payer: Self-pay | Admitting: Diagnostic Neuroimaging

## 2014-02-27 ENCOUNTER — Other Ambulatory Visit: Payer: Self-pay | Admitting: Dermatology

## 2014-02-27 DIAGNOSIS — L570 Actinic keratosis: Secondary | ICD-10-CM | POA: Diagnosis not present

## 2014-02-27 DIAGNOSIS — D485 Neoplasm of uncertain behavior of skin: Secondary | ICD-10-CM | POA: Diagnosis not present

## 2014-03-05 DIAGNOSIS — F329 Major depressive disorder, single episode, unspecified: Secondary | ICD-10-CM | POA: Diagnosis not present

## 2014-03-05 DIAGNOSIS — E039 Hypothyroidism, unspecified: Secondary | ICD-10-CM | POA: Diagnosis not present

## 2014-03-05 DIAGNOSIS — G43019 Migraine without aura, intractable, without status migrainosus: Secondary | ICD-10-CM | POA: Diagnosis not present

## 2014-04-15 DIAGNOSIS — H5712 Ocular pain, left eye: Secondary | ICD-10-CM | POA: Diagnosis not present

## 2014-05-09 DIAGNOSIS — I10 Essential (primary) hypertension: Secondary | ICD-10-CM | POA: Diagnosis not present

## 2014-05-09 DIAGNOSIS — Z95810 Presence of automatic (implantable) cardiac defibrillator: Secondary | ICD-10-CM | POA: Diagnosis not present

## 2014-05-09 DIAGNOSIS — E039 Hypothyroidism, unspecified: Secondary | ICD-10-CM | POA: Diagnosis not present

## 2014-05-09 DIAGNOSIS — I359 Nonrheumatic aortic valve disorder, unspecified: Secondary | ICD-10-CM | POA: Diagnosis not present

## 2014-05-09 DIAGNOSIS — I447 Left bundle-branch block, unspecified: Secondary | ICD-10-CM | POA: Diagnosis not present

## 2014-05-09 DIAGNOSIS — I429 Cardiomyopathy, unspecified: Secondary | ICD-10-CM | POA: Diagnosis not present

## 2014-05-13 DIAGNOSIS — G5790 Unspecified mononeuropathy of unspecified lower limb: Secondary | ICD-10-CM | POA: Diagnosis not present

## 2014-05-13 DIAGNOSIS — M1990 Unspecified osteoarthritis, unspecified site: Secondary | ICD-10-CM | POA: Diagnosis not present

## 2014-06-04 DIAGNOSIS — F329 Major depressive disorder, single episode, unspecified: Secondary | ICD-10-CM | POA: Diagnosis not present

## 2014-06-04 DIAGNOSIS — E038 Other specified hypothyroidism: Secondary | ICD-10-CM | POA: Diagnosis not present

## 2014-06-04 DIAGNOSIS — E039 Hypothyroidism, unspecified: Secondary | ICD-10-CM | POA: Diagnosis not present

## 2014-06-04 DIAGNOSIS — G43019 Migraine without aura, intractable, without status migrainosus: Secondary | ICD-10-CM | POA: Diagnosis not present

## 2014-06-05 DIAGNOSIS — R634 Abnormal weight loss: Secondary | ICD-10-CM | POA: Diagnosis not present

## 2014-06-05 DIAGNOSIS — G609 Hereditary and idiopathic neuropathy, unspecified: Secondary | ICD-10-CM | POA: Diagnosis not present

## 2014-06-05 DIAGNOSIS — R202 Paresthesia of skin: Secondary | ICD-10-CM | POA: Diagnosis not present

## 2014-06-05 DIAGNOSIS — M5412 Radiculopathy, cervical region: Secondary | ICD-10-CM | POA: Diagnosis not present

## 2014-06-05 DIAGNOSIS — G603 Idiopathic progressive neuropathy: Secondary | ICD-10-CM | POA: Diagnosis not present

## 2014-06-05 DIAGNOSIS — R7301 Impaired fasting glucose: Secondary | ICD-10-CM | POA: Diagnosis not present

## 2014-06-05 DIAGNOSIS — G5602 Carpal tunnel syndrome, left upper limb: Secondary | ICD-10-CM | POA: Diagnosis not present

## 2014-06-05 DIAGNOSIS — M5417 Radiculopathy, lumbosacral region: Secondary | ICD-10-CM | POA: Diagnosis not present

## 2014-06-05 DIAGNOSIS — G509 Disorder of trigeminal nerve, unspecified: Secondary | ICD-10-CM | POA: Diagnosis not present

## 2014-06-05 DIAGNOSIS — G245 Blepharospasm: Secondary | ICD-10-CM | POA: Diagnosis not present

## 2014-06-05 DIAGNOSIS — G518 Other disorders of facial nerve: Secondary | ICD-10-CM | POA: Diagnosis not present

## 2014-06-13 DIAGNOSIS — Z95810 Presence of automatic (implantable) cardiac defibrillator: Secondary | ICD-10-CM | POA: Diagnosis not present

## 2014-06-13 DIAGNOSIS — I447 Left bundle-branch block, unspecified: Secondary | ICD-10-CM | POA: Diagnosis not present

## 2014-06-13 DIAGNOSIS — I359 Nonrheumatic aortic valve disorder, unspecified: Secondary | ICD-10-CM | POA: Diagnosis not present

## 2014-06-13 DIAGNOSIS — E039 Hypothyroidism, unspecified: Secondary | ICD-10-CM | POA: Diagnosis not present

## 2014-06-13 DIAGNOSIS — I429 Cardiomyopathy, unspecified: Secondary | ICD-10-CM | POA: Diagnosis not present

## 2014-06-13 DIAGNOSIS — I10 Essential (primary) hypertension: Secondary | ICD-10-CM | POA: Diagnosis not present

## 2014-06-18 DIAGNOSIS — M5442 Lumbago with sciatica, left side: Secondary | ICD-10-CM | POA: Diagnosis not present

## 2014-06-18 DIAGNOSIS — G5602 Carpal tunnel syndrome, left upper limb: Secondary | ICD-10-CM | POA: Diagnosis not present

## 2014-06-18 DIAGNOSIS — G518 Other disorders of facial nerve: Secondary | ICD-10-CM | POA: Diagnosis not present

## 2014-06-18 DIAGNOSIS — M5412 Radiculopathy, cervical region: Secondary | ICD-10-CM | POA: Diagnosis not present

## 2014-06-18 DIAGNOSIS — M5441 Lumbago with sciatica, right side: Secondary | ICD-10-CM | POA: Diagnosis not present

## 2014-06-18 DIAGNOSIS — G603 Idiopathic progressive neuropathy: Secondary | ICD-10-CM | POA: Diagnosis not present

## 2014-07-01 DIAGNOSIS — E785 Hyperlipidemia, unspecified: Secondary | ICD-10-CM | POA: Diagnosis not present

## 2014-07-01 DIAGNOSIS — M810 Age-related osteoporosis without current pathological fracture: Secondary | ICD-10-CM | POA: Diagnosis not present

## 2014-07-01 DIAGNOSIS — E538 Deficiency of other specified B group vitamins: Secondary | ICD-10-CM | POA: Diagnosis not present

## 2014-07-01 DIAGNOSIS — Z0000 Encounter for general adult medical examination without abnormal findings: Secondary | ICD-10-CM | POA: Diagnosis not present

## 2014-07-01 DIAGNOSIS — R5383 Other fatigue: Secondary | ICD-10-CM | POA: Diagnosis not present

## 2014-07-01 DIAGNOSIS — Z780 Asymptomatic menopausal state: Secondary | ICD-10-CM | POA: Diagnosis not present

## 2014-07-01 DIAGNOSIS — F329 Major depressive disorder, single episode, unspecified: Secondary | ICD-10-CM | POA: Diagnosis not present

## 2014-07-01 DIAGNOSIS — E039 Hypothyroidism, unspecified: Secondary | ICD-10-CM | POA: Diagnosis not present

## 2014-07-01 DIAGNOSIS — G43019 Migraine without aura, intractable, without status migrainosus: Secondary | ICD-10-CM | POA: Diagnosis not present

## 2014-07-09 DIAGNOSIS — M899 Disorder of bone, unspecified: Secondary | ICD-10-CM | POA: Diagnosis not present

## 2014-07-10 DIAGNOSIS — Z0289 Encounter for other administrative examinations: Secondary | ICD-10-CM | POA: Diagnosis not present

## 2014-07-21 DIAGNOSIS — E538 Deficiency of other specified B group vitamins: Secondary | ICD-10-CM | POA: Diagnosis not present

## 2014-07-31 DIAGNOSIS — M5417 Radiculopathy, lumbosacral region: Secondary | ICD-10-CM | POA: Diagnosis not present

## 2014-07-31 DIAGNOSIS — M5441 Lumbago with sciatica, right side: Secondary | ICD-10-CM | POA: Diagnosis not present

## 2014-07-31 DIAGNOSIS — M5442 Lumbago with sciatica, left side: Secondary | ICD-10-CM | POA: Diagnosis not present

## 2014-07-31 DIAGNOSIS — M5412 Radiculopathy, cervical region: Secondary | ICD-10-CM | POA: Diagnosis not present

## 2014-07-31 DIAGNOSIS — G5602 Carpal tunnel syndrome, left upper limb: Secondary | ICD-10-CM | POA: Diagnosis not present

## 2014-07-31 DIAGNOSIS — G603 Idiopathic progressive neuropathy: Secondary | ICD-10-CM | POA: Diagnosis not present

## 2014-08-01 DIAGNOSIS — Z0289 Encounter for other administrative examinations: Secondary | ICD-10-CM | POA: Diagnosis not present

## 2014-08-07 DIAGNOSIS — Z0289 Encounter for other administrative examinations: Secondary | ICD-10-CM | POA: Diagnosis not present

## 2014-08-08 DIAGNOSIS — I359 Nonrheumatic aortic valve disorder, unspecified: Secondary | ICD-10-CM | POA: Diagnosis not present

## 2014-08-08 DIAGNOSIS — E039 Hypothyroidism, unspecified: Secondary | ICD-10-CM | POA: Diagnosis not present

## 2014-08-08 DIAGNOSIS — I429 Cardiomyopathy, unspecified: Secondary | ICD-10-CM | POA: Diagnosis not present

## 2014-08-08 DIAGNOSIS — Z95810 Presence of automatic (implantable) cardiac defibrillator: Secondary | ICD-10-CM | POA: Diagnosis not present

## 2014-08-08 DIAGNOSIS — I447 Left bundle-branch block, unspecified: Secondary | ICD-10-CM | POA: Diagnosis not present

## 2014-08-08 DIAGNOSIS — I10 Essential (primary) hypertension: Secondary | ICD-10-CM | POA: Diagnosis not present

## 2014-08-18 DIAGNOSIS — E538 Deficiency of other specified B group vitamins: Secondary | ICD-10-CM | POA: Diagnosis not present

## 2014-08-25 DIAGNOSIS — G500 Trigeminal neuralgia: Secondary | ICD-10-CM | POA: Diagnosis not present

## 2014-08-25 DIAGNOSIS — G250 Essential tremor: Secondary | ICD-10-CM | POA: Diagnosis not present

## 2014-08-25 DIAGNOSIS — M810 Age-related osteoporosis without current pathological fracture: Secondary | ICD-10-CM | POA: Diagnosis not present

## 2014-08-25 DIAGNOSIS — E538 Deficiency of other specified B group vitamins: Secondary | ICD-10-CM | POA: Diagnosis not present

## 2014-08-25 DIAGNOSIS — G43909 Migraine, unspecified, not intractable, without status migrainosus: Secondary | ICD-10-CM | POA: Diagnosis not present

## 2014-08-25 DIAGNOSIS — E890 Postprocedural hypothyroidism: Secondary | ICD-10-CM | POA: Diagnosis not present

## 2014-08-28 DIAGNOSIS — E538 Deficiency of other specified B group vitamins: Secondary | ICD-10-CM | POA: Diagnosis not present

## 2014-09-11 DIAGNOSIS — G603 Idiopathic progressive neuropathy: Secondary | ICD-10-CM | POA: Diagnosis not present

## 2014-09-11 DIAGNOSIS — G518 Other disorders of facial nerve: Secondary | ICD-10-CM | POA: Diagnosis not present

## 2014-09-11 DIAGNOSIS — M5412 Radiculopathy, cervical region: Secondary | ICD-10-CM | POA: Diagnosis not present

## 2014-09-11 DIAGNOSIS — M5442 Lumbago with sciatica, left side: Secondary | ICD-10-CM | POA: Diagnosis not present

## 2014-09-11 DIAGNOSIS — M5441 Lumbago with sciatica, right side: Secondary | ICD-10-CM | POA: Diagnosis not present

## 2014-09-11 DIAGNOSIS — G5602 Carpal tunnel syndrome, left upper limb: Secondary | ICD-10-CM | POA: Diagnosis not present

## 2014-09-19 DIAGNOSIS — M7741 Metatarsalgia, right foot: Secondary | ICD-10-CM | POA: Diagnosis not present

## 2014-09-24 DIAGNOSIS — M25562 Pain in left knee: Secondary | ICD-10-CM | POA: Diagnosis not present

## 2014-10-06 DIAGNOSIS — M79605 Pain in left leg: Secondary | ICD-10-CM | POA: Diagnosis not present

## 2014-10-06 DIAGNOSIS — R2689 Other abnormalities of gait and mobility: Secondary | ICD-10-CM | POA: Diagnosis not present

## 2014-10-14 DIAGNOSIS — M79605 Pain in left leg: Secondary | ICD-10-CM | POA: Diagnosis not present

## 2014-10-14 DIAGNOSIS — R2689 Other abnormalities of gait and mobility: Secondary | ICD-10-CM | POA: Diagnosis not present

## 2014-10-21 DIAGNOSIS — E538 Deficiency of other specified B group vitamins: Secondary | ICD-10-CM | POA: Diagnosis not present

## 2014-10-21 DIAGNOSIS — Z23 Encounter for immunization: Secondary | ICD-10-CM | POA: Diagnosis not present

## 2014-10-24 DIAGNOSIS — M79605 Pain in left leg: Secondary | ICD-10-CM | POA: Diagnosis not present

## 2014-10-24 DIAGNOSIS — R2689 Other abnormalities of gait and mobility: Secondary | ICD-10-CM | POA: Diagnosis not present

## 2014-11-04 DIAGNOSIS — R2689 Other abnormalities of gait and mobility: Secondary | ICD-10-CM | POA: Diagnosis not present

## 2014-11-04 DIAGNOSIS — M79605 Pain in left leg: Secondary | ICD-10-CM | POA: Diagnosis not present

## 2014-11-05 DIAGNOSIS — M25562 Pain in left knee: Secondary | ICD-10-CM | POA: Diagnosis not present

## 2014-11-05 DIAGNOSIS — M79671 Pain in right foot: Secondary | ICD-10-CM | POA: Diagnosis not present

## 2014-11-07 DIAGNOSIS — I429 Cardiomyopathy, unspecified: Secondary | ICD-10-CM | POA: Diagnosis not present

## 2014-11-07 DIAGNOSIS — I10 Essential (primary) hypertension: Secondary | ICD-10-CM | POA: Diagnosis not present

## 2014-11-07 DIAGNOSIS — Z95810 Presence of automatic (implantable) cardiac defibrillator: Secondary | ICD-10-CM | POA: Diagnosis not present

## 2014-11-07 DIAGNOSIS — E039 Hypothyroidism, unspecified: Secondary | ICD-10-CM | POA: Diagnosis not present

## 2014-11-07 DIAGNOSIS — I359 Nonrheumatic aortic valve disorder, unspecified: Secondary | ICD-10-CM | POA: Diagnosis not present

## 2014-11-07 DIAGNOSIS — I447 Left bundle-branch block, unspecified: Secondary | ICD-10-CM | POA: Diagnosis not present

## 2014-11-18 DIAGNOSIS — Z1231 Encounter for screening mammogram for malignant neoplasm of breast: Secondary | ICD-10-CM | POA: Diagnosis not present

## 2014-11-24 DIAGNOSIS — Z23 Encounter for immunization: Secondary | ICD-10-CM | POA: Diagnosis not present

## 2014-11-24 DIAGNOSIS — E538 Deficiency of other specified B group vitamins: Secondary | ICD-10-CM | POA: Diagnosis not present

## 2014-11-27 DIAGNOSIS — M7751 Other enthesopathy of right foot: Secondary | ICD-10-CM | POA: Diagnosis not present

## 2014-11-27 DIAGNOSIS — G5761 Lesion of plantar nerve, right lower limb: Secondary | ICD-10-CM | POA: Diagnosis not present

## 2014-12-10 DIAGNOSIS — G5761 Lesion of plantar nerve, right lower limb: Secondary | ICD-10-CM | POA: Diagnosis not present

## 2014-12-10 DIAGNOSIS — M7751 Other enthesopathy of right foot: Secondary | ICD-10-CM | POA: Diagnosis not present

## 2014-12-17 ENCOUNTER — Ambulatory Visit: Payer: Medicare Other | Admitting: Diagnostic Neuroimaging

## 2014-12-22 DIAGNOSIS — N952 Postmenopausal atrophic vaginitis: Secondary | ICD-10-CM | POA: Diagnosis not present

## 2014-12-22 DIAGNOSIS — N9089 Other specified noninflammatory disorders of vulva and perineum: Secondary | ICD-10-CM | POA: Diagnosis not present

## 2014-12-22 DIAGNOSIS — D519 Vitamin B12 deficiency anemia, unspecified: Secondary | ICD-10-CM | POA: Diagnosis not present

## 2014-12-22 DIAGNOSIS — G43909 Migraine, unspecified, not intractable, without status migrainosus: Secondary | ICD-10-CM | POA: Diagnosis not present

## 2014-12-24 ENCOUNTER — Ambulatory Visit: Payer: Self-pay | Admitting: Diagnostic Neuroimaging

## 2015-01-01 DIAGNOSIS — G5602 Carpal tunnel syndrome, left upper limb: Secondary | ICD-10-CM | POA: Diagnosis not present

## 2015-01-01 DIAGNOSIS — M5417 Radiculopathy, lumbosacral region: Secondary | ICD-10-CM | POA: Diagnosis not present

## 2015-01-01 DIAGNOSIS — G603 Idiopathic progressive neuropathy: Secondary | ICD-10-CM | POA: Diagnosis not present

## 2015-01-01 DIAGNOSIS — M5441 Lumbago with sciatica, right side: Secondary | ICD-10-CM | POA: Diagnosis not present

## 2015-01-01 DIAGNOSIS — M5442 Lumbago with sciatica, left side: Secondary | ICD-10-CM | POA: Diagnosis not present

## 2015-01-01 DIAGNOSIS — M5412 Radiculopathy, cervical region: Secondary | ICD-10-CM | POA: Diagnosis not present

## 2015-01-23 DIAGNOSIS — G5761 Lesion of plantar nerve, right lower limb: Secondary | ICD-10-CM | POA: Diagnosis not present

## 2015-01-23 DIAGNOSIS — M71571 Other bursitis, not elsewhere classified, right ankle and foot: Secondary | ICD-10-CM | POA: Diagnosis not present

## 2015-01-24 ENCOUNTER — Other Ambulatory Visit: Payer: Self-pay | Admitting: Diagnostic Neuroimaging

## 2015-01-25 ENCOUNTER — Other Ambulatory Visit: Payer: Self-pay

## 2015-02-08 DIAGNOSIS — Z95810 Presence of automatic (implantable) cardiac defibrillator: Secondary | ICD-10-CM | POA: Diagnosis not present

## 2015-02-08 DIAGNOSIS — I359 Nonrheumatic aortic valve disorder, unspecified: Secondary | ICD-10-CM | POA: Diagnosis not present

## 2015-02-08 DIAGNOSIS — I429 Cardiomyopathy, unspecified: Secondary | ICD-10-CM | POA: Diagnosis not present

## 2015-02-08 DIAGNOSIS — I10 Essential (primary) hypertension: Secondary | ICD-10-CM | POA: Diagnosis not present

## 2015-02-08 DIAGNOSIS — I447 Left bundle-branch block, unspecified: Secondary | ICD-10-CM | POA: Diagnosis not present

## 2015-02-08 DIAGNOSIS — E039 Hypothyroidism, unspecified: Secondary | ICD-10-CM | POA: Diagnosis not present

## 2015-02-10 DIAGNOSIS — G629 Polyneuropathy, unspecified: Secondary | ICD-10-CM | POA: Diagnosis not present

## 2015-02-10 DIAGNOSIS — Z23 Encounter for immunization: Secondary | ICD-10-CM | POA: Diagnosis not present

## 2015-02-10 DIAGNOSIS — Z029 Encounter for administrative examinations, unspecified: Secondary | ICD-10-CM | POA: Diagnosis not present

## 2015-02-10 DIAGNOSIS — Z111 Encounter for screening for respiratory tuberculosis: Secondary | ICD-10-CM | POA: Diagnosis not present

## 2015-02-13 DIAGNOSIS — M71571 Other bursitis, not elsewhere classified, right ankle and foot: Secondary | ICD-10-CM | POA: Diagnosis not present

## 2015-02-13 DIAGNOSIS — Z79899 Other long term (current) drug therapy: Secondary | ICD-10-CM | POA: Diagnosis not present

## 2015-02-13 DIAGNOSIS — G5761 Lesion of plantar nerve, right lower limb: Secondary | ICD-10-CM | POA: Diagnosis not present

## 2015-02-21 ENCOUNTER — Other Ambulatory Visit: Payer: Self-pay | Admitting: Diagnostic Neuroimaging

## 2015-02-24 DIAGNOSIS — M2570 Osteophyte, unspecified joint: Secondary | ICD-10-CM | POA: Diagnosis not present

## 2015-02-24 DIAGNOSIS — G5760 Lesion of plantar nerve, unspecified lower limb: Secondary | ICD-10-CM | POA: Diagnosis not present

## 2015-02-24 DIAGNOSIS — G5761 Lesion of plantar nerve, right lower limb: Secondary | ICD-10-CM | POA: Diagnosis not present

## 2015-02-24 DIAGNOSIS — M2041 Other hammer toe(s) (acquired), right foot: Secondary | ICD-10-CM | POA: Diagnosis not present

## 2015-02-24 DIAGNOSIS — M2040 Other hammer toe(s) (acquired), unspecified foot: Secondary | ICD-10-CM | POA: Diagnosis not present

## 2015-02-26 DIAGNOSIS — M79605 Pain in left leg: Secondary | ICD-10-CM | POA: Diagnosis not present

## 2015-02-26 DIAGNOSIS — J111 Influenza due to unidentified influenza virus with other respiratory manifestations: Secondary | ICD-10-CM | POA: Diagnosis not present

## 2015-02-26 DIAGNOSIS — R05 Cough: Secondary | ICD-10-CM | POA: Diagnosis not present

## 2015-02-26 DIAGNOSIS — R2689 Other abnormalities of gait and mobility: Secondary | ICD-10-CM | POA: Diagnosis not present

## 2015-02-27 DIAGNOSIS — M25571 Pain in right ankle and joints of right foot: Secondary | ICD-10-CM | POA: Diagnosis not present

## 2015-03-02 DIAGNOSIS — L821 Other seborrheic keratosis: Secondary | ICD-10-CM | POA: Diagnosis not present

## 2015-03-02 DIAGNOSIS — D225 Melanocytic nevi of trunk: Secondary | ICD-10-CM | POA: Diagnosis not present

## 2015-03-02 DIAGNOSIS — L570 Actinic keratosis: Secondary | ICD-10-CM | POA: Diagnosis not present

## 2015-03-02 DIAGNOSIS — Z85828 Personal history of other malignant neoplasm of skin: Secondary | ICD-10-CM | POA: Diagnosis not present

## 2015-03-05 ENCOUNTER — Encounter: Payer: Self-pay | Admitting: Internal Medicine

## 2015-03-10 DIAGNOSIS — Z95810 Presence of automatic (implantable) cardiac defibrillator: Secondary | ICD-10-CM | POA: Diagnosis not present

## 2015-03-10 DIAGNOSIS — I447 Left bundle-branch block, unspecified: Secondary | ICD-10-CM | POA: Diagnosis not present

## 2015-03-10 DIAGNOSIS — I359 Nonrheumatic aortic valve disorder, unspecified: Secondary | ICD-10-CM | POA: Diagnosis not present

## 2015-03-10 DIAGNOSIS — I10 Essential (primary) hypertension: Secondary | ICD-10-CM | POA: Diagnosis not present

## 2015-03-10 DIAGNOSIS — I429 Cardiomyopathy, unspecified: Secondary | ICD-10-CM | POA: Diagnosis not present

## 2015-03-10 DIAGNOSIS — E039 Hypothyroidism, unspecified: Secondary | ICD-10-CM | POA: Diagnosis not present

## 2015-03-11 DIAGNOSIS — M25571 Pain in right ankle and joints of right foot: Secondary | ICD-10-CM | POA: Diagnosis not present

## 2015-03-11 DIAGNOSIS — G5761 Lesion of plantar nerve, right lower limb: Secondary | ICD-10-CM | POA: Diagnosis not present

## 2015-03-17 DIAGNOSIS — G250 Essential tremor: Secondary | ICD-10-CM | POA: Diagnosis not present

## 2015-03-17 DIAGNOSIS — Z0000 Encounter for general adult medical examination without abnormal findings: Secondary | ICD-10-CM | POA: Diagnosis not present

## 2015-03-17 DIAGNOSIS — G629 Polyneuropathy, unspecified: Secondary | ICD-10-CM | POA: Diagnosis not present

## 2015-03-17 DIAGNOSIS — H9190 Unspecified hearing loss, unspecified ear: Secondary | ICD-10-CM | POA: Diagnosis not present

## 2015-03-17 DIAGNOSIS — E890 Postprocedural hypothyroidism: Secondary | ICD-10-CM | POA: Diagnosis not present

## 2015-03-17 DIAGNOSIS — I509 Heart failure, unspecified: Secondary | ICD-10-CM | POA: Diagnosis not present

## 2015-03-17 DIAGNOSIS — Z1211 Encounter for screening for malignant neoplasm of colon: Secondary | ICD-10-CM | POA: Diagnosis not present

## 2015-03-17 DIAGNOSIS — I7300 Raynaud's syndrome without gangrene: Secondary | ICD-10-CM | POA: Diagnosis not present

## 2015-03-20 DIAGNOSIS — G250 Essential tremor: Secondary | ICD-10-CM | POA: Diagnosis not present

## 2015-03-20 DIAGNOSIS — I509 Heart failure, unspecified: Secondary | ICD-10-CM | POA: Diagnosis not present

## 2015-03-20 DIAGNOSIS — G629 Polyneuropathy, unspecified: Secondary | ICD-10-CM | POA: Diagnosis not present

## 2015-03-27 DIAGNOSIS — M25571 Pain in right ankle and joints of right foot: Secondary | ICD-10-CM | POA: Diagnosis not present

## 2015-03-27 DIAGNOSIS — M792 Neuralgia and neuritis, unspecified: Secondary | ICD-10-CM | POA: Diagnosis not present

## 2015-04-02 DIAGNOSIS — G5761 Lesion of plantar nerve, right lower limb: Secondary | ICD-10-CM | POA: Diagnosis not present

## 2015-04-09 DIAGNOSIS — M5442 Lumbago with sciatica, left side: Secondary | ICD-10-CM | POA: Diagnosis not present

## 2015-04-09 DIAGNOSIS — G603 Idiopathic progressive neuropathy: Secondary | ICD-10-CM | POA: Diagnosis not present

## 2015-04-09 DIAGNOSIS — G5601 Carpal tunnel syndrome, right upper limb: Secondary | ICD-10-CM | POA: Diagnosis not present

## 2015-04-09 DIAGNOSIS — M5417 Radiculopathy, lumbosacral region: Secondary | ICD-10-CM | POA: Diagnosis not present

## 2015-04-09 DIAGNOSIS — G5602 Carpal tunnel syndrome, left upper limb: Secondary | ICD-10-CM | POA: Diagnosis not present

## 2015-04-09 DIAGNOSIS — M5441 Lumbago with sciatica, right side: Secondary | ICD-10-CM | POA: Diagnosis not present

## 2015-04-10 DIAGNOSIS — M79674 Pain in right toe(s): Secondary | ICD-10-CM | POA: Diagnosis not present

## 2015-04-10 DIAGNOSIS — M25571 Pain in right ankle and joints of right foot: Secondary | ICD-10-CM | POA: Diagnosis not present

## 2015-04-10 DIAGNOSIS — G5761 Lesion of plantar nerve, right lower limb: Secondary | ICD-10-CM | POA: Diagnosis not present

## 2015-04-17 DIAGNOSIS — M545 Low back pain: Secondary | ICD-10-CM | POA: Diagnosis not present

## 2015-04-17 DIAGNOSIS — E890 Postprocedural hypothyroidism: Secondary | ICD-10-CM | POA: Diagnosis not present

## 2015-04-17 DIAGNOSIS — M25652 Stiffness of left hip, not elsewhere classified: Secondary | ICD-10-CM | POA: Diagnosis not present

## 2015-04-17 DIAGNOSIS — E538 Deficiency of other specified B group vitamins: Secondary | ICD-10-CM | POA: Diagnosis not present

## 2015-04-21 ENCOUNTER — Other Ambulatory Visit: Payer: Self-pay | Admitting: Orthopedic Surgery

## 2015-04-21 DIAGNOSIS — M545 Low back pain, unspecified: Secondary | ICD-10-CM

## 2015-04-21 DIAGNOSIS — M79652 Pain in left thigh: Secondary | ICD-10-CM

## 2015-04-21 DIAGNOSIS — M79605 Pain in left leg: Secondary | ICD-10-CM

## 2015-04-21 DIAGNOSIS — G8929 Other chronic pain: Secondary | ICD-10-CM

## 2015-04-30 DIAGNOSIS — M25571 Pain in right ankle and joints of right foot: Secondary | ICD-10-CM | POA: Diagnosis not present

## 2015-05-01 ENCOUNTER — Encounter: Payer: BC Managed Care – PPO | Admitting: Internal Medicine

## 2015-05-10 DIAGNOSIS — I447 Left bundle-branch block, unspecified: Secondary | ICD-10-CM | POA: Diagnosis not present

## 2015-05-10 DIAGNOSIS — E039 Hypothyroidism, unspecified: Secondary | ICD-10-CM | POA: Diagnosis not present

## 2015-05-10 DIAGNOSIS — I10 Essential (primary) hypertension: Secondary | ICD-10-CM | POA: Diagnosis not present

## 2015-05-10 DIAGNOSIS — I429 Cardiomyopathy, unspecified: Secondary | ICD-10-CM | POA: Diagnosis not present

## 2015-05-10 DIAGNOSIS — I359 Nonrheumatic aortic valve disorder, unspecified: Secondary | ICD-10-CM | POA: Diagnosis not present

## 2015-05-10 DIAGNOSIS — Z95810 Presence of automatic (implantable) cardiac defibrillator: Secondary | ICD-10-CM | POA: Diagnosis not present

## 2015-05-14 ENCOUNTER — Ambulatory Visit
Admission: RE | Admit: 2015-05-14 | Discharge: 2015-05-14 | Disposition: A | Discharge status: Home / Self Care | Payer: Medicare Other | Source: Ambulatory Visit | Attending: Orthopedic Surgery | Admitting: Orthopedic Surgery

## 2015-05-14 VITALS — BP 88/45 | HR 65

## 2015-05-14 DIAGNOSIS — M79605 Pain in left leg: Secondary | ICD-10-CM

## 2015-05-14 DIAGNOSIS — M545 Low back pain, unspecified: Secondary | ICD-10-CM

## 2015-05-14 DIAGNOSIS — M4120 Other idiopathic scoliosis, site unspecified: Secondary | ICD-10-CM

## 2015-05-14 DIAGNOSIS — G8929 Other chronic pain: Secondary | ICD-10-CM

## 2015-05-14 DIAGNOSIS — M4326 Fusion of spine, lumbar region: Secondary | ICD-10-CM | POA: Diagnosis not present

## 2015-05-14 DIAGNOSIS — M79652 Pain in left thigh: Secondary | ICD-10-CM

## 2015-05-14 DIAGNOSIS — M48061 Spinal stenosis, lumbar region without neurogenic claudication: Secondary | ICD-10-CM

## 2015-05-14 MED ORDER — IOPAMIDOL (ISOVUE-M 200) INJECTION 41%
15.0000 mL | Freq: Once | INTRAMUSCULAR | Status: AC
  Administered 2015-05-14: 15 mL via INTRATHECAL

## 2015-05-14 MED ORDER — ONDANSETRON HCL 4 MG/2ML IJ SOLN: 4.0000 mg | Freq: Four times a day (QID) | INTRAMUSCULAR | Status: DC | PRN

## 2015-05-14 MED ORDER — DIAZEPAM 5 MG PO TABS
5.0000 mg | ORAL_TABLET | Freq: Once | ORAL | Status: AC
  Administered 2015-05-14: 5 mg via ORAL

## 2015-05-14 MED ORDER — MEPERIDINE HCL 100 MG/ML IJ SOLN
75.0000 mg | Freq: Once | INTRAMUSCULAR | Status: AC
  Administered 2015-05-14: 75 mg via INTRAMUSCULAR

## 2015-05-14 MED ORDER — ONDANSETRON HCL 4 MG/2ML IJ SOLN
4.0000 mg | Freq: Once | INTRAMUSCULAR | Status: AC
  Administered 2015-05-14: 4 mg via INTRAMUSCULAR

## 2015-05-14 NOTE — Discharge Instructions (Signed)
Myelogram Discharge Instructions  1. Go home and rest quietly for the next 24 hours.  It is important to lie flat for the next 24 hours.  Get up only to go to the restroom.  You may lie in the bed or on a couch on your back, your stomach, your left side or your right side.  You may have one pillow under your head.  You may have pillows between your knees while you are on your side or under your knees while you are on your back.  2. DO NOT drive today.  Recline the seat as far back as it will go, while still wearing your seat belt, on the way home.  3. You may get up to go to the bathroom as needed.  You may sit up for 10 minutes to eat.  You may resume your normal diet and medications unless otherwise indicated.  Drink lots of extra fluids today and tomorrow.  4. The incidence of headache, nausea, or vomiting is about 5% (one in 20 patients).  If you develop a headache, lie flat and drink plenty of fluids until the headache goes away.  Caffeinated beverages may be helpful.  If you develop severe nausea and vomiting or a headache that does not go away with flat bed rest, call [phone].  5. You may resume normal activities after your 24 hours of bed rest is over; however, do not exert yourself strongly or do any heavy lifting tomorrow. If when you get up you have a headache when standing, go back to bed and force fluids for another 24 hours.  6. Call your physician for a follow-up appointment.  The results of your myelogram will be sent directly to your physician by the following day.  7. If you have any questions or if complications develop after you arrive home, please call [phone].  Discharge instructions have been explained to the patient.  The patient, or the person responsible for the patient, fully understands these instructions.       May resume Imipramine on May 15, 2015, after 11:00 am.

## 2015-05-14 NOTE — Progress Notes (Signed)
Pt states she has been off Imipramine since Monday.

## 2015-05-15 DIAGNOSIS — E538 Deficiency of other specified B group vitamins: Secondary | ICD-10-CM | POA: Diagnosis not present

## 2015-05-18 DIAGNOSIS — K5901 Slow transit constipation: Secondary | ICD-10-CM | POA: Diagnosis not present

## 2015-05-18 DIAGNOSIS — Z01818 Encounter for other preprocedural examination: Secondary | ICD-10-CM | POA: Diagnosis not present

## 2015-05-18 DIAGNOSIS — Z1211 Encounter for screening for malignant neoplasm of colon: Secondary | ICD-10-CM | POA: Diagnosis not present

## 2015-05-27 DIAGNOSIS — M25652 Stiffness of left hip, not elsewhere classified: Secondary | ICD-10-CM | POA: Diagnosis not present

## 2015-05-27 DIAGNOSIS — M545 Low back pain: Secondary | ICD-10-CM | POA: Diagnosis not present

## 2015-06-03 DIAGNOSIS — M5412 Radiculopathy, cervical region: Secondary | ICD-10-CM | POA: Diagnosis not present

## 2015-06-03 DIAGNOSIS — G518 Other disorders of facial nerve: Secondary | ICD-10-CM | POA: Diagnosis not present

## 2015-06-03 DIAGNOSIS — G5603 Carpal tunnel syndrome, bilateral upper limbs: Secondary | ICD-10-CM | POA: Diagnosis not present

## 2015-06-03 DIAGNOSIS — M5441 Lumbago with sciatica, right side: Secondary | ICD-10-CM | POA: Diagnosis not present

## 2015-06-03 DIAGNOSIS — M5442 Lumbago with sciatica, left side: Secondary | ICD-10-CM | POA: Diagnosis not present

## 2015-06-03 DIAGNOSIS — G603 Idiopathic progressive neuropathy: Secondary | ICD-10-CM | POA: Diagnosis not present

## 2015-06-10 ENCOUNTER — Encounter: Payer: Self-pay | Admitting: Internal Medicine

## 2015-06-10 ENCOUNTER — Ambulatory Visit (INDEPENDENT_AMBULATORY_CARE_PROVIDER_SITE_OTHER): Payer: Medicare Other | Admitting: Internal Medicine

## 2015-06-10 VITALS — BP 98/72 | HR 73 | Ht 67.5 in | Wt 163.5 lb

## 2015-06-10 DIAGNOSIS — I5022 Chronic systolic (congestive) heart failure: Secondary | ICD-10-CM | POA: Diagnosis not present

## 2015-06-10 NOTE — Patient Instructions (Signed)
Medication Instructions:  Your physician recommends that you continue on your current medications as directed. Please refer to the Current Medication list given to you today.   Labwork: None ordered   Testing/Procedures: None ordered   Follow-Up: Remote monitoring is used to monitor your  ICD from home. This monitoring reduces the number of office visits required to check your device to one time per year. It allows [place] to keep an eye on the functioning of your device to ensure it is working properly. You are scheduled for a device check from home on 09/09/15. You may send your transmission at any time that day. If you have a wireless device, the transmission will be sent automatically. After your physician reviews your transmission, you will receive a postcard with your next transmission date.  Your physician wants you to follow-up in: 12 months with Dr Knox Saliva will receive a reminder letter in the mail two months in advance. If you don't receive a letter, please call our office to schedule the follow-up appointment.     Any Other Special Instructions Will Be Listed Below (If Applicable).     If you need a refill on your cardiac medications before your next appointment, please call your pharmacy.

## 2015-06-10 NOTE — Progress Notes (Signed)
HPI Mrs. Schouten returns today for followup. She is a very pleasant 60 year old woman with a nonischemic cardiomyopathy, chronic systolic heart failure, left bundle branch block, status post biventricular ICD implantation. In the interim, she has been stable. She notes that her CHF symptoms are actually improved.  She has had no frank syncope and no ICD shocks. She has had problems with her back and leg and has questions about having an MRI.  Allergies  Allergen Reactions  . Codeine Other (See Comments)    Chest pain  . Fentanyl Other (See Comments)    Bad dreams  . Morphine And Related Other (See Comments)    Bad dreams  . Robaxin [Methocarbamol] Other (See Comments)    Chest pain.     Current Outpatient Prescriptions  Medication Sig Dispense Refill  . acetaminophen (TYLENOL) 325 MG tablet Take 650 mg by mouth every 6 (six) hours as needed for mild pain.     Marland Kitchen alendronate (FOSAMAX) 10 MG tablet Take 10 mg by mouth once a week. Take with a full glass of water on an empty stomach. On sundays    . carisoprodol (SOMA) 350 MG tablet Take 350 mg by mouth 3 (three) times daily.  3  . carvedilol (COREG) 3.125 MG tablet Take 3.125 mg by mouth 2 (two) times daily.  12  . eletriptan (RELPAX) 40 MG tablet Take 40 mg by mouth daily as needed. For migraine. may repeat in 2 hours if necessary    . fluticasone (FLONASE) 50 MCG/ACT nasal spray Place 1 spray into both nostrils daily as needed for allergies or rhinitis.    . furosemide (LASIX) 40 MG tablet Take 20 mg by mouth daily.    Marland Kitchen gabapentin (NEURONTIN) 600 MG tablet Take 1 tablet (600 mg total) by mouth 3 (three) times daily. 270 tablet 4  . imipramine (TOFRANIL) 25 MG tablet Take 75 mg by mouth at bedtime.     Marland Kitchen levothyroxine (SYNTHROID, LEVOTHROID) 112 MCG tablet Take 112 mcg by mouth daily before breakfast.    . lisinopril (PRINIVIL,ZESTRIL) 2.5 MG tablet Take 2.5 mg by mouth daily.      . Magnesium 250 MG TABS Take 1 tablet by mouth 2 (two)  times daily.    . primidone (MYSOLINE) 50 MG tablet Take 1 tablet (50 mg total) by mouth at bedtime. 30 tablet 6  . spironolactone (ALDACTONE) 25 MG tablet Take 12.5 mg by mouth daily.     . traMADol (ULTRAM) 50 MG tablet Take 50 mg by mouth 2 (two) times daily.  3   No current facility-administered medications for this visit.     Past Medical History  Diagnosis Date  . Pleural effusion, right     thoracentesis 02/09/09   . CHF (congestive heart failure) (HCC)     Dr. Wynonia Lawman  . Cardiomyopathy   . Hypertension     essential- benign  . Aortic valve disorder   . LBBB (left bundle branch block)     conduction disorder  . Migraines   . Osteoporosis   . Hypothyroidism   . Anxiety 01/20/11    "in the past; not now"  . Raynaud's disease   . Fibroids   . Scoliosis   . AF (paroxysmal atrial fibrillation) ([place])   . H/O: hysterectomy     secondary to uterine prolapse  . Hyperthyroidism   . ICD (implantable cardiac defibrillator) in place   . Pneumonia   . GERD (gastroesophageal reflux disease)     otc  .  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 Complication of anesthesia     aspiration  . PONV (postoperative nausea and vomiting)   . Family history of anesthesia complication     nausea  . Shortness of breath   . Arthritis     ROS:   All systems reviewed and negative except as noted in the HPI.   Past Surgical History  Procedure Laterality Date  . Vesicovaginal fistula closure w/ tah    . Thyroid lobectomy  ~ 2006    right  . Rotator cuff repair  ~ 2009    left  . Cardiac defibrillator placement  01/20/11  . Cholecystectomy  ~1996  . Tubal ligation  ~ 1983  . Vaginal hysterectomy  ~ 2009  . Back surgery  12/31/2009    "for flat back syndrome; fused bottom of my back"  . Foot neuroma surgery  ~ 2003    right  . Skin cancer excision      nose, forehead, left leg  . Scoliosis surgery    . Shoulder arthroscopy w/ rotator cuff repair  09/21/2011   . Artery biopsy  12/29/2011    Procedure: MINOR BIOPSY TEMPORAL ARTERY;  Surgeon: Edward Jolly, MD;  Location: [facility];  Service: General;  Laterality: Right;  Right temporal artery biopsy  . Bi-ventricular implantable cardioverter defibrillator Left 01/20/2011    Procedure: BI-VENTRICULAR IMPLANTABLE CARDIOVERTER DEFIBRILLATOR  (CRT-D);  Surgeon: Evans Lance, MD;  Location: [facility] CATH LAB;  Service: Cardiovascular;  Laterality: Left;     Family History  Problem Relation Age of Onset  . Heart disease Mother   . Heart failure Mother 83    CHF  . Stroke Mother   . Hypertension Mother   . Heart disease Sister   . Heart disease Father 65  . Macular degeneration Father   . Atrial fibrillation Sister   . Heart disease Sister      Social History   Social History  . Marital Status: Married    Spouse Name: Ronalee Belts  . Number of Children: 1  . Years of Education: MA   Occupational History  . Teacher     n/a   Social History Main Topics  . Smoking status: Never Smoker   . Smokeless tobacco: Never Used  . Alcohol Use: No  . Drug Use: No  . Sexual Activity: Yes   Other Topics Concern  . Not on file   Social History Narrative   Patient lives at home with family.   Caffeine Use: 1-2 cups daily     BP 98/72 mmHg  Pulse 73  Ht 5' 7.5" (1.715 m)  Wt 163 lb 8 oz (74.163 kg)  BMI 25.22 kg/m2  Physical Exam:  Well appearing middle-aged woman, NAD HEENT: Unremarkable Neck:  6 cm JVD, no thyromegally Lungs:  Clear with no wheezes, rales, or rhonchi. Well-healed ICD incision. HEART:  Regular rate rhythm, no murmurs, no rubs, no clicks Abd:  soft, positive bowel sounds, no organomegally, no rebound, no guarding Ext:  2 plus pulses, no edema, no cyanosis, no clubbing Skin:  No rashes no nodules Neuro:  CN II through XII intact, motor grossly intact  ECG - NSR with Biv pacing  DEVICE  Normal device function.  See PaceArt for details.   Assess/Plan:   1. Chronic systolic heart failure - her symptoms are class 2. She will continue her current meds. 2. ICD - her  device is working normally. Will recheck in several months. 3. Chronic pain - her symptoms are fairly severe. She appears to have her pain controlled. She will followup with her orthopedic surgeon.  Mikle Bosworth.D.

## 2015-06-11 ENCOUNTER — Encounter: Payer: Self-pay | Admitting: Internal Medicine

## 2015-06-12 NOTE — Addendum Note (Signed)
Addended by: Freada Bergeron on: 06/12/2015 10:14 AM   Modules accepted: Orders

## 2015-06-19 DIAGNOSIS — N3001 Acute cystitis with hematuria: Secondary | ICD-10-CM | POA: Diagnosis not present

## 2015-06-19 DIAGNOSIS — R300 Dysuria: Secondary | ICD-10-CM | POA: Diagnosis not present

## 2015-06-19 DIAGNOSIS — R350 Frequency of micturition: Secondary | ICD-10-CM | POA: Diagnosis not present

## 2015-06-30 DIAGNOSIS — E538 Deficiency of other specified B group vitamins: Secondary | ICD-10-CM | POA: Diagnosis not present

## 2015-07-03 DIAGNOSIS — L600 Ingrowing nail: Secondary | ICD-10-CM | POA: Diagnosis not present

## 2015-07-13 DIAGNOSIS — Z95810 Presence of automatic (implantable) cardiac defibrillator: Secondary | ICD-10-CM | POA: Diagnosis not present

## 2015-07-13 DIAGNOSIS — I359 Nonrheumatic aortic valve disorder, unspecified: Secondary | ICD-10-CM | POA: Diagnosis not present

## 2015-07-13 DIAGNOSIS — I10 Essential (primary) hypertension: Secondary | ICD-10-CM | POA: Diagnosis not present

## 2015-07-13 DIAGNOSIS — I429 Cardiomyopathy, unspecified: Secondary | ICD-10-CM | POA: Diagnosis not present

## 2015-07-13 DIAGNOSIS — I447 Left bundle-branch block, unspecified: Secondary | ICD-10-CM | POA: Diagnosis not present

## 2015-07-13 DIAGNOSIS — E039 Hypothyroidism, unspecified: Secondary | ICD-10-CM | POA: Diagnosis not present

## 2015-07-15 DIAGNOSIS — M79652 Pain in left thigh: Secondary | ICD-10-CM | POA: Diagnosis not present

## 2015-07-15 DIAGNOSIS — M79605 Pain in left leg: Secondary | ICD-10-CM | POA: Diagnosis not present

## 2015-07-17 DIAGNOSIS — M79605 Pain in left leg: Secondary | ICD-10-CM | POA: Diagnosis not present

## 2015-07-17 DIAGNOSIS — M79652 Pain in left thigh: Secondary | ICD-10-CM | POA: Diagnosis not present

## 2015-07-17 DIAGNOSIS — M792 Neuralgia and neuritis, unspecified: Secondary | ICD-10-CM | POA: Diagnosis not present

## 2015-07-22 DIAGNOSIS — M79605 Pain in left leg: Secondary | ICD-10-CM | POA: Diagnosis not present

## 2015-07-22 DIAGNOSIS — M79652 Pain in left thigh: Secondary | ICD-10-CM | POA: Diagnosis not present

## 2015-07-28 DIAGNOSIS — E538 Deficiency of other specified B group vitamins: Secondary | ICD-10-CM | POA: Diagnosis not present

## 2015-07-28 DIAGNOSIS — M79605 Pain in left leg: Secondary | ICD-10-CM | POA: Diagnosis not present

## 2015-07-28 DIAGNOSIS — M79652 Pain in left thigh: Secondary | ICD-10-CM | POA: Diagnosis not present

## 2015-07-31 DIAGNOSIS — M79605 Pain in left leg: Secondary | ICD-10-CM | POA: Diagnosis not present

## 2015-07-31 DIAGNOSIS — M79652 Pain in left thigh: Secondary | ICD-10-CM | POA: Diagnosis not present

## 2015-08-04 DIAGNOSIS — M79605 Pain in left leg: Secondary | ICD-10-CM | POA: Diagnosis not present

## 2015-08-04 DIAGNOSIS — M79652 Pain in left thigh: Secondary | ICD-10-CM | POA: Diagnosis not present

## 2015-08-09 DIAGNOSIS — I359 Nonrheumatic aortic valve disorder, unspecified: Secondary | ICD-10-CM | POA: Diagnosis not present

## 2015-08-09 DIAGNOSIS — I447 Left bundle-branch block, unspecified: Secondary | ICD-10-CM | POA: Diagnosis not present

## 2015-08-09 DIAGNOSIS — I10 Essential (primary) hypertension: Secondary | ICD-10-CM | POA: Diagnosis not present

## 2015-08-09 DIAGNOSIS — I429 Cardiomyopathy, unspecified: Secondary | ICD-10-CM | POA: Diagnosis not present

## 2015-08-09 DIAGNOSIS — E039 Hypothyroidism, unspecified: Secondary | ICD-10-CM | POA: Diagnosis not present

## 2015-08-09 DIAGNOSIS — Z95810 Presence of automatic (implantable) cardiac defibrillator: Secondary | ICD-10-CM | POA: Diagnosis not present

## 2015-08-17 DIAGNOSIS — M79605 Pain in left leg: Secondary | ICD-10-CM | POA: Diagnosis not present

## 2015-08-17 DIAGNOSIS — M79652 Pain in left thigh: Secondary | ICD-10-CM | POA: Diagnosis not present

## 2015-08-19 DIAGNOSIS — M79652 Pain in left thigh: Secondary | ICD-10-CM | POA: Diagnosis not present

## 2015-08-19 DIAGNOSIS — M79605 Pain in left leg: Secondary | ICD-10-CM | POA: Diagnosis not present

## 2015-08-25 DIAGNOSIS — E538 Deficiency of other specified B group vitamins: Secondary | ICD-10-CM | POA: Diagnosis not present

## 2015-08-25 DIAGNOSIS — H6121 Impacted cerumen, right ear: Secondary | ICD-10-CM | POA: Diagnosis not present

## 2015-08-25 DIAGNOSIS — L237 Allergic contact dermatitis due to plants, except food: Secondary | ICD-10-CM | POA: Diagnosis not present

## 2015-08-27 DIAGNOSIS — G245 Blepharospasm: Secondary | ICD-10-CM | POA: Diagnosis not present

## 2015-08-27 DIAGNOSIS — G603 Idiopathic progressive neuropathy: Secondary | ICD-10-CM | POA: Diagnosis not present

## 2015-08-27 DIAGNOSIS — G250 Essential tremor: Secondary | ICD-10-CM | POA: Diagnosis not present

## 2015-08-27 DIAGNOSIS — G518 Other disorders of facial nerve: Secondary | ICD-10-CM | POA: Diagnosis not present

## 2015-08-27 DIAGNOSIS — M5417 Radiculopathy, lumbosacral region: Secondary | ICD-10-CM | POA: Diagnosis not present

## 2015-08-27 DIAGNOSIS — R262 Difficulty in walking, not elsewhere classified: Secondary | ICD-10-CM | POA: Diagnosis not present

## 2015-08-27 DIAGNOSIS — G5603 Carpal tunnel syndrome, bilateral upper limbs: Secondary | ICD-10-CM | POA: Diagnosis not present

## 2015-08-27 DIAGNOSIS — R202 Paresthesia of skin: Secondary | ICD-10-CM | POA: Diagnosis not present

## 2015-08-27 DIAGNOSIS — G4700 Insomnia, unspecified: Secondary | ICD-10-CM | POA: Diagnosis not present

## 2015-08-27 DIAGNOSIS — M5412 Radiculopathy, cervical region: Secondary | ICD-10-CM | POA: Diagnosis not present

## 2015-08-31 DIAGNOSIS — N3001 Acute cystitis with hematuria: Secondary | ICD-10-CM | POA: Diagnosis not present

## 2015-08-31 DIAGNOSIS — I10 Essential (primary) hypertension: Secondary | ICD-10-CM | POA: Diagnosis not present

## 2015-08-31 DIAGNOSIS — I429 Cardiomyopathy, unspecified: Secondary | ICD-10-CM | POA: Diagnosis not present

## 2015-08-31 DIAGNOSIS — I447 Left bundle-branch block, unspecified: Secondary | ICD-10-CM | POA: Diagnosis not present

## 2015-08-31 DIAGNOSIS — I428 Other cardiomyopathies: Secondary | ICD-10-CM | POA: Diagnosis not present

## 2015-08-31 DIAGNOSIS — I359 Nonrheumatic aortic valve disorder, unspecified: Secondary | ICD-10-CM | POA: Diagnosis not present

## 2015-08-31 DIAGNOSIS — R350 Frequency of micturition: Secondary | ICD-10-CM | POA: Diagnosis not present

## 2015-08-31 DIAGNOSIS — E039 Hypothyroidism, unspecified: Secondary | ICD-10-CM | POA: Diagnosis not present

## 2015-08-31 DIAGNOSIS — Z95810 Presence of automatic (implantable) cardiac defibrillator: Secondary | ICD-10-CM | POA: Diagnosis not present

## 2015-09-09 ENCOUNTER — Telehealth: Payer: Self-pay | Admitting: Cardiology

## 2015-09-09 ENCOUNTER — Encounter: Payer: Medicare Other | Admitting: *Deleted

## 2015-09-09 NOTE — Telephone Encounter (Signed)
Spoke with pt and reminded pt of remote transmission that is due today. Pt verbalized understanding.   

## 2015-09-10 ENCOUNTER — Encounter (HOSPITAL_COMMUNITY): Payer: Self-pay | Admitting: *Deleted

## 2015-09-10 ENCOUNTER — Emergency Department (HOSPITAL_COMMUNITY)
Admission: EM | Admit: 2015-09-10 | Discharge: 2015-09-10 | Disposition: A | Discharge status: Home / Self Care | Payer: Medicare Other | Attending: Emergency Medicine | Admitting: Emergency Medicine

## 2015-09-10 ENCOUNTER — Emergency Department (HOSPITAL_COMMUNITY): Payer: Medicare Other

## 2015-09-10 DIAGNOSIS — I5022 Chronic systolic (congestive) heart failure: Secondary | ICD-10-CM | POA: Insufficient documentation

## 2015-09-10 DIAGNOSIS — Z85820 Personal history of malignant melanoma of skin: Secondary | ICD-10-CM | POA: Insufficient documentation

## 2015-09-10 DIAGNOSIS — Q438 Other specified congenital malformations of intestine: Secondary | ICD-10-CM | POA: Diagnosis not present

## 2015-09-10 DIAGNOSIS — I110 Hypertensive heart disease with heart failure: Secondary | ICD-10-CM | POA: Diagnosis not present

## 2015-09-10 DIAGNOSIS — Z1211 Encounter for screening for malignant neoplasm of colon: Secondary | ICD-10-CM | POA: Diagnosis not present

## 2015-09-10 DIAGNOSIS — R109 Unspecified abdominal pain: Secondary | ICD-10-CM

## 2015-09-10 DIAGNOSIS — R1032 Left lower quadrant pain: Secondary | ICD-10-CM | POA: Diagnosis not present

## 2015-09-10 DIAGNOSIS — Z95810 Presence of automatic (implantable) cardiac defibrillator: Secondary | ICD-10-CM | POA: Diagnosis not present

## 2015-09-10 DIAGNOSIS — E039 Hypothyroidism, unspecified: Secondary | ICD-10-CM | POA: Diagnosis not present

## 2015-09-10 LAB — CBC WITH DIFFERENTIAL/PLATELET
Basophils Absolute: 0 10*3/uL (ref 0.0–0.1)
Basophils Relative: 0 %
Eosinophils Absolute: 0.1 10*3/uL (ref 0.0–0.7)
Eosinophils Relative: 1 %
HCT: 41.1 % (ref 36.0–46.0)
Hemoglobin: 13.4 g/dL (ref 12.0–15.0)
Lymphocytes Relative: 12 %
Lymphs Abs: 1.2 10*3/uL (ref 0.7–4.0)
MCH: 30.6 pg (ref 26.0–34.0)
MCHC: 32.6 g/dL (ref 30.0–36.0)
MCV: 93.8 fL (ref 78.0–100.0)
Monocytes Absolute: 0.8 10*3/uL (ref 0.1–1.0)
Monocytes Relative: 8 %
Neutro Abs: 8 10*3/uL — ABNORMAL HIGH (ref 1.7–7.7)
Neutrophils Relative %: 79 %
Platelets: 273 10*3/uL (ref 150–400)
RBC: 4.38 MIL/uL (ref 3.87–5.11)
RDW: 13.7 % (ref 11.5–15.5)
WBC: 10.1 10*3/uL (ref 4.0–10.5)

## 2015-09-10 LAB — BASIC METABOLIC PANEL
Anion gap: 6 (ref 5–15)
BUN: 11 mg/dL (ref 6–20)
CO2: 24 mmol/L (ref 22–32)
Calcium: 8.3 mg/dL — ABNORMAL LOW (ref 8.9–10.3)
Chloride: 110 mmol/L (ref 101–111)
Creatinine, Ser: 0.77 mg/dL (ref 0.44–1.00)
GFR calc Af Amer: >60 mL/min (ref >60)
GFR calc non Af Amer: >60 mL/min (ref >60)
Glucose, Bld: 104 mg/dL — ABNORMAL HIGH (ref 65–99)
Potassium: 3.8 mmol/L (ref 3.5–5.1)
Sodium: 140 mmol/L (ref 135–145)

## 2015-09-10 MED ORDER — IOPAMIDOL (ISOVUE-300) INJECTION 61%
INTRAVENOUS | Status: AC
Start: 2015-09-10 — End: 2015-09-10
  Administered 2015-09-10: 100 mL
  Filled 2015-09-10: qty 100

## 2015-09-10 NOTE — ED Provider Notes (Signed)
[facility] DEPT Provider Note   CSN: [id number] Arrival date & time: 09/10/15  0957     History   Chief Complaint Chief Complaint  Patient presents with  . Abdominal Pain    HPI [name] is a 60 y.o. female.  [name] is a 60 y.o. Female who presents to the ED from her GI office for rule out abdominal perforation after colonoscopy this morning. The patient was seen by Dr. Cristina Gong this morning and had a colonoscopy at 8:30 am. She reports since she has had bad abdominal pain that is worse in her LLQ. She also thinks it might be gas and reports she just passed gas in the bathroom that made her feel somewhat better. She was sent by GI for rule out perforation. She denies fevers, nausea, vomiting, diarrhea, chest pain, shortness of breath, shoulder pain, back pain, urinary symptoms or rashes.   The history is provided by the patient. No language interpreter was used.    Past Medical History:  Diagnosis Date  . AF (paroxysmal atrial fibrillation) ([place])   . Anxiety 01/20/11   "in the past; not now"  . Aortic valve disorder   . Arthritis   . Cardiomyopathy   . CHF (congestive heart failure) (HCC)    Dr. Wynonia Lawman  . Complication of anesthesia    aspiration  . Family history of anesthesia complication    nausea  . Fibroids   . GERD (gastroesophageal reflux disease)    otc  . H/O: hysterectomy    secondary to uterine prolapse  . Hypertension    essential- benign  . Hyperthyroidism   . Hypothyroidism   . ICD (implantable cardiac defibrillator) in place   . LBBB (left bundle branch block)    conduction disorder  . Migraines   . Osteoporosis   . Pleural effusion, right    thoracentesis 02/09/09   . Pneumonia   . PONV (postoperative nausea and vomiting)   . Raynaud's disease   . Scoliosis   . Shortness of breath   . 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 Chronic systolic  heart failure ([place]) 02/07/2014  . Trigeminal neuralgia 12/14/2012  . Insomnia, persistent 11/03/2011  . AICD (automatic cardioverter/defibrillator) present   . Aortic regurgitation   . Nonischemic cardiomyopathy ([place]) 01/21/2011  . Disc disease with myelopathy, lumbar 11/17/2010  . Congestive heart failure ([place]) 11/17/2010  . 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 Esophagitis, reflux 08/24/2010  . Idiopathic peripheral neuropathy ([place]) 08/24/2010  . Hypothyroidsim   . History of migraine headaches   . LBBB   . Scoliosis     Class: Chronic  . Lumbar canal stenosis 12/25/2009  . Anxiety state 07/24/2008  . Avitaminosis D 03/21/2008  . Atypical migraine 01/30/2008  . Benign essential HTN 01/30/2008  . Paroxysmal digital cyanosis 02/15/2007    Past Surgical History:  Procedure Laterality Date  . ARTERY BIOPSY  12/29/2011   Procedure: MINOR BIOPSY TEMPORAL ARTERY;  Surgeon: Edward Jolly, MD;  Location: [facility];  Service: General;  Laterality: Right;  Right temporal artery biopsy  . BACK SURGERY  12/31/2009   "for flat back syndrome; fused bottom of my back"  . BI-VENTRICULAR IMPLANTABLE CARDIOVERTER DEFIBRILLATOR Left 01/20/2011   Procedure: BI-VENTRICULAR IMPLANTABLE CARDIOVERTER DEFIBRILLATOR  (CRT-D);  Surgeon: Evans Lance, MD;  Location: [facility] CATH LAB;  Service: Cardiovascular;  Laterality: Left;  . CARDIAC DEFIBRILLATOR PLACEMENT  01/20/11  . CHOLECYSTECTOMY  ~1996  . FOOT NEUROMA SURGERY  ~ 2003   right  . ROTATOR CUFF REPAIR  ~ 2009   left  . scoliosis surgery    . SHOULDER ARTHROSCOPY W/ ROTATOR CUFF REPAIR  09/21/2011  . SKIN CANCER EXCISION     nose, forehead, left leg  . THYROID LOBECTOMY  ~ 2006   right  . TUBAL LIGATION  ~ 1983  . VAGINAL HYSTERECTOMY  ~ 2009  . VESICOVAGINAL FISTULA CLOSURE W/ TAH      OB History    No data available       Home Medications    Prior to Admission medications   Medication Sig Start  Date End Date Taking? Authorizing Provider  acetaminophen (TYLENOL) 325 MG tablet Take 650 mg by mouth every 6 (six) hours as needed for mild pain.     Historical Provider, MD  alendronate (FOSAMAX) 10 MG tablet Take 10 mg by mouth once a week. Take with a full glass of water on an empty stomach. On sundays    Historical Provider, MD  carisoprodol (SOMA) 350 MG tablet Take 350 mg by mouth 3 (three) times daily. 06/04/15   Historical Provider, MD  carvedilol (COREG) 3.125 MG tablet Take 3.125 mg by mouth 2 (two) times daily. 01/02/14   Historical Provider, MD  eletriptan (RELPAX) 40 MG tablet Take 40 mg by mouth daily as needed. For migraine. may repeat in 2 hours if necessary    Historical Provider, MD  fluticasone (FLONASE) 50 MCG/ACT nasal spray Place 1 spray into both nostrils daily as needed for allergies or rhinitis.    Historical Provider, MD  furosemide (LASIX) 40 MG tablet Take 20 mg by mouth daily.    Historical Provider, MD  gabapentin (NEURONTIN) 600 MG tablet Take 1 tablet (600 mg total) by mouth 3 (three) times daily. 12/14/12   Penni Bombard, MD  imipramine (TOFRANIL) 25 MG tablet Take 75 mg by mouth at bedtime.     Historical Provider, MD  levothyroxine (SYNTHROID, LEVOTHROID) 112 MCG tablet Take 112 mcg by mouth daily before breakfast.    Historical Provider, MD  lisinopril (PRINIVIL,ZESTRIL) 2.5 MG tablet Take 2.5 mg by mouth daily.      Historical Provider, MD  Magnesium 250 MG TABS Take 1 tablet by mouth 2 (two) times daily.    Historical Provider, MD  primidone (MYSOLINE) 50 MG tablet Take 1 tablet (50 mg total) by mouth at bedtime. 01/28/14   Penni Bombard, MD  spironolactone (ALDACTONE) 25 MG tablet Take 12.5 mg by mouth daily.     Historical Provider, MD  traMADol (ULTRAM) 50 MG tablet Take 50 mg by mouth 2 (two) times daily. 06/04/15   Historical Provider, MD    Family History Family History  Problem Relation Age of Onset  . Heart disease Mother   . Heart failure  Mother 73    CHF  . Stroke Mother   . Hypertension Mother   . Heart disease Father 21  . Macular degeneration Father   . Heart disease Sister   . Atrial fibrillation Sister   . Heart disease Sister     Social History Social History  Substance Use Topics  . Smoking status: Never Smoker  . Smokeless tobacco: Never Used  . Alcohol use No     Allergies   Codeine; Fentanyl; Morphine and related; and Robaxin [methocarbamol]   Review of Systems Review of  Systems  Constitutional: Negative for chills and fever.  HENT: Negative for congestion and sore throat.   Eyes: Negative for visual disturbance.  Respiratory: Negative for cough and shortness of breath.   Cardiovascular: Negative for chest pain.  Gastrointestinal: Positive for abdominal distention and abdominal pain. Negative for diarrhea, nausea and vomiting.  Genitourinary: Negative for difficulty urinating and dysuria.  Musculoskeletal: Negative for back pain and neck pain.  Skin: Negative for rash.  Neurological: Negative for headaches.     Physical Exam Updated Vital Signs BP 115/65 (BP Location: Right Arm)   Pulse 73   Temp 98.3 F (36.8 C) (Oral)   Resp 18   Ht 5\' 7"  (1.702 m)   Wt 72.6 kg   SpO2 100%   BMI 25.06 kg/m   Physical Exam  Constitutional: She appears well-developed and well-nourished. No distress.  Nontoxic appearing.  HENT:  Head: Normocephalic and atraumatic.  Mouth/Throat: Oropharynx is clear and moist.  Eyes: Conjunctivae are normal. Pupils are equal, round, and reactive to light. Right eye exhibits no discharge. Left eye exhibits no discharge.  Neck: Neck supple.  Cardiovascular: Normal rate, regular rhythm, normal heart sounds and intact distal pulses.  Exam reveals no gallop and no friction rub.   No murmur heard. Pulmonary/Chest: Effort normal and breath sounds normal. No respiratory distress. She has no wheezes. She has no rales.  Abdominal: Soft. Bowel sounds are normal. She  exhibits distension. She exhibits no mass. There is tenderness. There is no rebound and no guarding.  Abdomen is slightly distended and generally tender to palpation which is worse in her left lower quadrant. Abdomen is soft. Bowel sounds are present.  Musculoskeletal: She exhibits no edema.  Lymphadenopathy:    She has no cervical adenopathy.  Neurological: She is alert. Coordination normal.  Skin: Skin is warm and dry. Capillary refill takes less than 2 seconds. No rash noted. She is not diaphoretic. No erythema. No pallor.  Psychiatric: She has a normal mood and affect. Her behavior is normal.  Nursing note and vitals reviewed.    ED Treatments / Results  Labs (all labs ordered are listed, but only abnormal results are displayed) Labs Reviewed  BASIC METABOLIC PANEL - Abnormal; Notable for the following:       Result Value   Glucose, Bld 104 (*)    Calcium 8.3 (*)    All other components within normal limits  CBC WITH DIFFERENTIAL/PLATELET - Abnormal; Notable for the following:    Neutro Abs 8.0 (*)    All other components within normal limits    EKG  EKG Interpretation None       Radiology Ct Abdomen Pelvis W Contrast  Result Date: 09/10/2015 CLINICAL DATA:  Sudden onset of severe left lower abdomen pain after colonoscopy EXAM: CT ABDOMEN AND PELVIS WITH CONTRAST TECHNIQUE: Multidetector CT imaging of the abdomen and pelvis was performed using the standard protocol following bolus administration of intravenous contrast. CONTRAST:  140mL ISOVUE-300 IOPAMIDOL (ISOVUE-300) INJECTION 61% COMPARISON:  CT abdomen pelvis of 04/30/2013 FINDINGS: The lung bases are clear. The heart is mildly enlarged and stable. Pacer wires are noted. The liver enhances with no focal abnormality and no ductal dilatation is seen. The gallbladder has previously been resected. The pancreas is normal in size and the pancreatic duct is not dilated. The adrenal glands and spleen are unremarkable. The stomach  is not well distended but no abnormality is noted the kidneys enhance with no calculus or mass and on delayed images, the  pelvocaliceal systems are unremarkable and the proximal ureters are normal in caliber. The abdominal aorta is normal in caliber with mild atherosclerotic change present. The urinary bladder is moderately well distended with no abnormality noted. The uterus has previously been resected. No adnexal lesion is seen. No free fluid is noted within the pelvis. The colon is not distended but no abnormality is seen. There is no evidence of free intraperitoneal air. The terminal ileum is unremarkable. The appendix is not definitely seen. A metallic clip lies adjacent to the right ascending colon laterally of questionable significance. There is a lumbar curvature convex the right and there is hardware for fixation of the thoracolumbar spine. IMPRESSION: 1. There is no evidence of free intraperitoneal air. 2. A metallic clip lies adjacent to the right ascending colon laterally of uncertain significance. 3. Mild abdominal aortic atherosclerosis. Electronically Signed   By: Ivar Drape M.D.   On: 09/10/2015 13:03    Procedures Procedures (including critical care time)  Medications Ordered in ED Medications  iopamidol (ISOVUE-300) 61 % injection (100 mLs  Contrast Given 09/10/15 1235)     Initial Impression / Assessment and Plan / ED Course  I have reviewed the triage vital signs and the nursing notes.  Pertinent labs & imaging results that were available during my care of the patient were reviewed by me and considered in my medical decision making (see chart for details).  Clinical Course   This is a 60 y.o. Female who presents to the ED from her GI office for rule out abdominal perforation after colonoscopy this morning. The patient was seen by Dr. Carma Leaven this morning and had a colonoscopy at 8:30 am. She reports since she has had bad abdominal pain that is worse in her LLQ. She also thinks  it might be gas and reports she just passed gas in the bathroom that made her feel somewhat better. She was sent by GI for rule out perforation. On exam the patient is afebrile nontoxic appearing. Her abdomen is soft and she has very mild abdominal distention with some mild left lower quadrant abdominal tenderness to palpation. Basic blood work and CT abdomen and pelvis with IV contrast only. CBC, and BMP are unremarkable. CT abdomen pelvis with contrast showed no sign of perforation. At reevaluation patient reports she is feeling better. She is feeling ready to be discharged. Dr. Cristina Gong also came by to see the patient prior to discharge.  I advised the patient to follow-up with their primary care provider this week. I advised the patient to return to the emergency department with new or worsening symptoms or new concerns. The patient verbalized understanding and agreement with plan.     Final Clinical Impressions(s) / ED Diagnoses   Final diagnoses:  Abdominal pain, unspecified abdominal location    New Prescriptions Discharge Medication List as of 09/10/2015  1:49 PM       Waynetta Pean, PA-C 09/10/15 [place], MD 09/10/15 2214

## 2015-09-10 NOTE — ED Notes (Signed)
Warm blanket given

## 2015-09-10 NOTE — ED Notes (Signed)
Patient undressed and in a gown at this time; patient ambulated without any difficulty or distress; patient will attempt to provide an urine specimen

## 2015-09-10 NOTE — ED Notes (Signed)
Patient back from bathroom; placed on monitor, continuous pulse oximetry and blood pressure cuff; visitor at bedside

## 2015-09-10 NOTE — ED Triage Notes (Addendum)
Patient had colonoscopy today for a regular check-up and had post-op left sided sharp abdominal pain. Patient contacted GI MD and he stated to come to the ED to r/o perf. V/s stable. Abd soft and distended. BS present.

## 2015-09-11 ENCOUNTER — Encounter: Payer: Self-pay | Admitting: Cardiology

## 2015-09-11 NOTE — Progress Notes (Signed)
Letter  

## 2015-09-29 DIAGNOSIS — E538 Deficiency of other specified B group vitamins: Secondary | ICD-10-CM | POA: Diagnosis not present

## 2015-09-29 DIAGNOSIS — B373 Candidiasis of vulva and vagina: Secondary | ICD-10-CM | POA: Diagnosis not present

## 2015-09-29 DIAGNOSIS — Z23 Encounter for immunization: Secondary | ICD-10-CM | POA: Diagnosis not present

## 2015-10-05 DIAGNOSIS — J0140 Acute pansinusitis, unspecified: Secondary | ICD-10-CM | POA: Diagnosis not present

## 2015-10-05 DIAGNOSIS — R51 Headache: Secondary | ICD-10-CM | POA: Diagnosis not present

## 2015-10-08 DIAGNOSIS — H18413 Arcus senilis, bilateral: Secondary | ICD-10-CM | POA: Diagnosis not present

## 2015-10-08 DIAGNOSIS — H5319 Other subjective visual disturbances: Secondary | ICD-10-CM | POA: Diagnosis not present

## 2015-10-28 ENCOUNTER — Encounter (HOSPITAL_BASED_OUTPATIENT_CLINIC_OR_DEPARTMENT_OTHER): Payer: Self-pay | Admitting: *Deleted

## 2015-10-28 ENCOUNTER — Emergency Department (HOSPITAL_BASED_OUTPATIENT_CLINIC_OR_DEPARTMENT_OTHER)
Admission: EM | Admit: 2015-10-28 | Discharge: 2015-10-28 | Disposition: A | Discharge status: Home / Self Care | Payer: Medicare Other | Attending: Emergency Medicine | Admitting: Emergency Medicine

## 2015-10-28 DIAGNOSIS — I110 Hypertensive heart disease with heart failure: Secondary | ICD-10-CM | POA: Insufficient documentation

## 2015-10-28 DIAGNOSIS — I509 Heart failure, unspecified: Secondary | ICD-10-CM | POA: Insufficient documentation

## 2015-10-28 DIAGNOSIS — E039 Hypothyroidism, unspecified: Secondary | ICD-10-CM | POA: Diagnosis not present

## 2015-10-28 DIAGNOSIS — L03116 Cellulitis of left lower limb: Secondary | ICD-10-CM | POA: Insufficient documentation

## 2015-10-28 DIAGNOSIS — L03119 Cellulitis of unspecified part of limb: Secondary | ICD-10-CM | POA: Diagnosis not present

## 2015-10-28 DIAGNOSIS — M7989 Other specified soft tissue disorders: Secondary | ICD-10-CM | POA: Diagnosis present

## 2015-10-28 DIAGNOSIS — L02619 Cutaneous abscess of unspecified foot: Secondary | ICD-10-CM | POA: Diagnosis not present

## 2015-10-28 DIAGNOSIS — Z85828 Personal history of other malignant neoplasm of skin: Secondary | ICD-10-CM | POA: Insufficient documentation

## 2015-10-28 DIAGNOSIS — Z79899 Other long term (current) drug therapy: Secondary | ICD-10-CM | POA: Diagnosis not present

## 2015-10-28 MED ORDER — CEPHALEXIN 500 MG PO CAPS: 500.0000 mg | ORAL_CAPSULE | Freq: Three times a day (TID) | ORAL | 0 refills | Status: DC

## 2015-10-28 MED ORDER — LIDOCAINE HCL (PF) 1 % IJ SOLN
INTRAMUSCULAR | Status: AC
  Administered 2015-10-28: 2 mL
  Filled 2015-10-28: qty 5

## 2015-10-28 MED ORDER — CEFTRIAXONE SODIUM 1 G IJ SOLR
1.0000 g | Freq: Once | INTRAMUSCULAR | Status: AC
  Administered 2015-10-28: 1 g via INTRAMUSCULAR
  Filled 2015-10-28: qty 10

## 2015-10-28 NOTE — Discharge Instructions (Signed)
Stop Cipro.  Start Keflex tomorrow.  Follow-up with her primary care physician if not resolving by the time you complete your antibiotics.

## 2015-10-28 NOTE — ED Provider Notes (Signed)
[facility] DEPT [facility] Provider Note   CSN: [id number] Arrival date & time: 10/28/15  1935  By signing my name below, I, Irene Pap, attest that this documentation has been prepared under the direction and in the presence of Tanna Furry, MD. Electronically Signed: Irene Pap, ED Scribe. 10/28/15. 9:00 PM.  History   Chief Complaint Chief Complaint  Patient presents with  . Leg Pain   The history is provided by the patient. No language interpreter was used.   HPI Comments: [name] is a 60 y.o. female with a hx of CHF, HTN, ICD and skin cancer who presents to the Emergency Department complaining of gradually worsening left leg and arm swelling onset one week ago. Pt says that she was scratched in both areas by her dog's nail and now has increased swelling and redness to the areas. Pt has mild pain and itching to the areas. Pt is allergic to Fentanyl, Codeine, Morphine, and Robaxin. She is not on any blood thinners. Pt is currently on a course of Cipro for a bladder infection. She was on the medication for 3 days, stopped for 3 days, and started taking it again 3 days ago. She denies fever, chills, nausea, vomiting, or drainage.   Pt has noticed red blotchy areas on her arms and is unsure of the source. She is not on long term blood thinners or steroids. She has had the spots on her arms for about a year. Husband says that pt's father had similar symptoms. She has normal blood work performed in the past year.  Past Medical History:  Diagnosis Date  . AF (paroxysmal atrial fibrillation) ([place])   . Anxiety 01/20/11   "in the past; not now"  . Aortic valve disorder   . Arthritis   . Cardiomyopathy   . CHF (congestive heart failure) (HCC)    Dr. Wynonia Lawman  . Complication of anesthesia    aspiration  . Family history of anesthesia complication    nausea  . Fibroids   . GERD (gastroesophageal reflux disease)    otc  . H/O: hysterectomy    secondary to uterine prolapse  .  Hypertension    essential- benign  . Hyperthyroidism   . Hypothyroidism   . ICD (implantable cardiac defibrillator) in place   . LBBB (left bundle branch block)    conduction disorder  . Migraines   . Osteoporosis   . Pleural effusion, right    thoracentesis 02/09/09   . Pneumonia   . PONV (postoperative nausea and vomiting)   . Raynaud's disease   . Scoliosis   . Shortness of breath   . 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 Chronic systolic heart failure ([place]) 02/07/2014  . Trigeminal neuralgia 12/14/2012  . Insomnia, persistent 11/03/2011  . AICD (automatic cardioverter/defibrillator) present   . Aortic regurgitation   . Nonischemic cardiomyopathy ([place]) 01/21/2011  . Disc disease with myelopathy, lumbar 11/17/2010  . Congestive heart failure ([place]) 11/17/2010  . Constitutional aplastic anemia ([place]) 09/20/2010  . Esophagitis, reflux 08/24/2010  . Idiopathic peripheral neuropathy 08/24/2010  . Hypothyroidsim   . History of migraine headaches   . LBBB   . Scoliosis     Class: Chronic  . Lumbar canal stenosis 12/25/2009  . Anxiety state 07/24/2008  . Avitaminosis D 03/21/2008  . Atypical migraine 01/30/2008  . Benign essential HTN 01/30/2008  . Paroxysmal digital  cyanosis 02/15/2007    Past Surgical History:  Procedure Laterality Date  . ARTERY BIOPSY  12/29/2011   Procedure: MINOR BIOPSY TEMPORAL ARTERY;  Surgeon: Edward Jolly, MD;  Location: [facility];  Service: General;  Laterality: Right;  Right temporal artery biopsy  . BACK SURGERY  12/31/2009   "for flat back syndrome; fused bottom of my back"  . BI-VENTRICULAR IMPLANTABLE CARDIOVERTER DEFIBRILLATOR Left 01/20/2011   Procedure: BI-VENTRICULAR IMPLANTABLE CARDIOVERTER DEFIBRILLATOR  (CRT-D);  Surgeon: Evans Lance, MD;  Location: [facility] CATH LAB;  Service: Cardiovascular;  Laterality: Left;  . CARDIAC  DEFIBRILLATOR PLACEMENT  01/20/11  . CHOLECYSTECTOMY  ~1996  . FOOT NEUROMA SURGERY  ~ 2003   right  . ROTATOR CUFF REPAIR  ~ 2009   left  . scoliosis surgery    . SHOULDER ARTHROSCOPY W/ ROTATOR CUFF REPAIR  09/21/2011  . SKIN CANCER EXCISION     nose, forehead, left leg  . THYROID LOBECTOMY  ~ 2006   right  . TUBAL LIGATION  ~ 1983  . VAGINAL HYSTERECTOMY  ~ 2009  . VESICOVAGINAL FISTULA CLOSURE W/ TAH      OB History    No data available       Home Medications    Prior to Admission medications   Medication Sig Start Date End Date Taking? Authorizing Provider  acetaminophen (TYLENOL) 325 MG tablet Take 650 mg by mouth every 6 (six) hours as needed for mild pain.     Historical Provider, MD  alendronate (FOSAMAX) 10 MG tablet Take 10 mg by mouth once a week. Take with a full glass of water on an empty stomach. On sundays    Historical Provider, MD  carisoprodol (SOMA) 350 MG tablet Take 350 mg by mouth 3 (three) times daily. 06/04/15   Historical Provider, MD  carvedilol (COREG) 3.125 MG tablet Take 3.125 mg by mouth 2 (two) times daily. 01/02/14   Historical Provider, MD  cephALEXin (KEFLEX) 500 MG capsule Take 1 capsule (500 mg total) by mouth 3 (three) times daily. 10/28/15   Tanna Furry, MD  eletriptan (RELPAX) 40 MG tablet Take 40 mg by mouth daily as needed. For migraine. may repeat in 2 hours if necessary    Historical Provider, MD  fluticasone (FLONASE) 50 MCG/ACT nasal spray Place 1 spray into both nostrils daily as needed for allergies or rhinitis.    Historical Provider, MD  furosemide (LASIX) 40 MG tablet Take 20 mg by mouth daily.    Historical Provider, MD  gabapentin (NEURONTIN) 600 MG tablet Take 1 tablet (600 mg total) by mouth 3 (three) times daily. 12/14/12   Penni Bombard, MD  imipramine (TOFRANIL) 25 MG tablet Take 75 mg by mouth at bedtime.     Historical Provider, MD  levothyroxine (SYNTHROID, LEVOTHROID) 112 MCG tablet Take 112 mcg by mouth daily before  breakfast.    Historical Provider, MD  lisinopril (PRINIVIL,ZESTRIL) 2.5 MG tablet Take 2.5 mg by mouth daily.      Historical Provider, MD  Magnesium 250 MG TABS Take 1 tablet by mouth 2 (two) times daily.    Historical Provider, MD  primidone (MYSOLINE) 50 MG tablet Take 1 tablet (50 mg total) by mouth at bedtime. 01/28/14   Penni Bombard, MD  spironolactone (ALDACTONE) 25 MG tablet Take 12.5 mg by mouth daily.     Historical Provider, MD  traMADol (ULTRAM) 50 MG tablet Take 50 mg by mouth 2 (two) times daily. 06/04/15   Historical  Provider, MD    Family History Family History  Problem Relation Age of Onset  . Heart disease Mother   . Heart failure Mother 49    CHF  . Stroke Mother   . Hypertension Mother   . Heart disease Father 103  . Macular degeneration Father   . Heart disease Sister   . Atrial fibrillation Sister   . Heart disease Sister     Social History Social History  Substance Use Topics  . Smoking status: Never Smoker  . Smokeless tobacco: Never Used  . Alcohol use No     Allergies   Codeine; Fentanyl; Morphine and related; and Robaxin [methocarbamol]   Review of Systems Review of Systems  Constitutional: Negative for appetite change, chills, diaphoresis, fatigue and fever.  HENT: Negative for mouth sores, sore throat and trouble swallowing.   Eyes: Negative for visual disturbance.  Respiratory: Negative for cough, chest tightness, shortness of breath and wheezing.   Cardiovascular: Negative for chest pain.  Gastrointestinal: Negative for abdominal distention, abdominal pain, diarrhea, nausea and vomiting.  Endocrine: Negative for polydipsia, polyphagia and polyuria.  Genitourinary: Negative for dysuria, frequency and hematuria.  Musculoskeletal: Negative for gait problem.  Skin: Positive for wound. Negative for color change, pallor and rash.  Neurological: Negative for dizziness, syncope, light-headedness and headaches.  Hematological: Does not  bruise/bleed easily.  Psychiatric/Behavioral: Negative for behavioral problems and confusion.   Physical Exam Updated Vital Signs BP 128/73 (BP Location: Left Arm)   Pulse 80   Temp 97.8 F (36.6 C)   Resp 16   Ht 5' 7.5" (1.715 m)   Wt 160 lb (72.6 kg)   SpO2 98%   BMI 24.69 kg/m   Physical Exam  Constitutional: She is oriented to person, place, and time. She appears well-developed and well-nourished. No distress.  HENT:  Head: Normocephalic.  Eyes: Conjunctivae are normal. Pupils are equal, round, and reactive to light. No scleral icterus.  Neck: Normal range of motion. Neck supple. No thyromegaly present.  Cardiovascular: Normal rate and regular rhythm.  Exam reveals no gallop and no friction rub.   No murmur heard. Pulmonary/Chest: Effort normal and breath sounds normal. No respiratory distress. She has no wheezes. She has no rales.  Abdominal: Soft. Bowel sounds are normal. She exhibits no distension. There is no tenderness. There is no rebound.  Musculoskeletal: Normal range of motion.  Neurological: She is alert and oriented to person, place, and time.  Skin: Skin is warm and dry. No rash noted.  Multiple areas of ecchymosis on bilateral arms. Pt states present for years.  RLE: 2 cm laceration with 3 cm surrounding erythema/cellulitis  Psychiatric: She has a normal mood and affect. Her behavior is normal.     ED Treatments / Results  DIAGNOSTIC STUDIES: Oxygen Saturation is 98% on RA, normal by my interpretation.    COORDINATION OF CARE: 9:00 PM-Discussed treatment plan which includes antibiotics with pt at bedside and pt agreed to plan.    Labs (all labs ordered are listed, but only abnormal results are displayed) Labs Reviewed - No data to display  EKG  EKG Interpretation None       Radiology No results found.  Procedures Procedures (including critical care time)  Medications Ordered in ED Medications  cefTRIAXone (ROCEPHIN) injection 1 g (not  administered)     Initial Impression / Assessment and Plan / ED Course  I have reviewed the triage vital signs and the nursing notes.  Pertinent labs & imaging results  that were available during my care of the patient were reviewed by me and considered in my medical decision making (see chart for details).  Clinical Course    Minimal surrounding wound cellulitis. Plan Keflex.  Final Clinical Impressions(s) / ED Diagnoses   Final diagnoses:  Cellulitis of left lower extremity   I personally performed the services described in this documentation, which was scribed in my presence. The recorded information has been reviewed and is accurate.  New Prescriptions New Prescriptions   CEPHALEXIN (KEFLEX) 500 MG CAPSULE    Take 1 capsule (500 mg total) by mouth 3 (three) times daily.     Tanna Furry, MD 10/28/15 2115

## 2015-10-28 NOTE — ED Triage Notes (Signed)
Pt left leg and arm injury , scratch from dog nail x 1 week and now redness and swelling

## 2015-10-30 ENCOUNTER — Emergency Department (HOSPITAL_BASED_OUTPATIENT_CLINIC_OR_DEPARTMENT_OTHER)
Admission: EM | Admit: 2015-10-30 | Discharge: 2015-10-31 | Disposition: A | Discharge status: Home / Self Care | Payer: Medicare Other | Attending: Emergency Medicine | Admitting: Emergency Medicine

## 2015-10-30 ENCOUNTER — Encounter (HOSPITAL_BASED_OUTPATIENT_CLINIC_OR_DEPARTMENT_OTHER): Payer: Self-pay | Admitting: *Deleted

## 2015-10-30 DIAGNOSIS — Z85828 Personal history of other malignant neoplasm of skin: Secondary | ICD-10-CM | POA: Insufficient documentation

## 2015-10-30 DIAGNOSIS — E039 Hypothyroidism, unspecified: Secondary | ICD-10-CM | POA: Diagnosis not present

## 2015-10-30 DIAGNOSIS — I110 Hypertensive heart disease with heart failure: Secondary | ICD-10-CM | POA: Diagnosis not present

## 2015-10-30 DIAGNOSIS — Z79899 Other long term (current) drug therapy: Secondary | ICD-10-CM | POA: Insufficient documentation

## 2015-10-30 DIAGNOSIS — L03116 Cellulitis of left lower limb: Secondary | ICD-10-CM | POA: Diagnosis not present

## 2015-10-30 DIAGNOSIS — I5022 Chronic systolic (congestive) heart failure: Secondary | ICD-10-CM | POA: Insufficient documentation

## 2015-10-30 DIAGNOSIS — Z4801 Encounter for change or removal of surgical wound dressing: Secondary | ICD-10-CM | POA: Diagnosis present

## 2015-10-30 MED ORDER — DIPHENHYDRAMINE HCL 25 MG PO CAPS
50.0000 mg | ORAL_CAPSULE | Freq: Once | ORAL | Status: AC
  Administered 2015-10-31: 50 mg via ORAL
  Filled 2015-10-30: qty 2

## 2015-10-30 MED ORDER — FAMOTIDINE 20 MG PO TABS
20.0000 mg | ORAL_TABLET | Freq: Once | ORAL | Status: AC
Start: 2015-10-31 — End: 2015-10-31
  Administered 2015-10-31: 20 mg via ORAL
  Filled 2015-10-30: qty 1

## 2015-10-30 NOTE — ED Triage Notes (Signed)
Her dog scratched her left arm and left lower leg. She was started on antibiotics for cellulitis of her leg on Wednesday. Her arm was red on Wednesday. Today both her leg and arm look worse.

## 2015-10-30 NOTE — ED Provider Notes (Signed)
[facility] DEPT [facility] Provider Note   CSN: [id number] Arrival date & time: 10/30/15  2021  By signing my name below, I, Jeanell Sparrow, attest that this documentation has been prepared under the direction and in the presence of non-physician practitioner, Doristine Devoid, PA-C. Electronically Signed: Jeanell Sparrow, Scribe. 10/30/2015. 11:30 PM.  History   Chief Complaint Chief Complaint  Patient presents with  . Wound Check   The history is provided by the patient. No language interpreter was used.   HPI Comments: [name] is a 60 y.o. female who presents to the Emergency Department for a wound re-check. She states her dog scratched her LLE and LUE 2 days ago, and she was seen in the ED the same day. She has been taking Keflex 500mg  for two days without any relief or change in progression of symptoms. She reports associated symptoms of pain, itchiness, and redness at injury sites.Patient states that she feels the redness and swelling has not significantly increased. However, she states that the itching has worsened. She denies any fever, chills, Headache, vision changes, dizziness, lightheadedness, chest pain or shortness of breath, nausea, emesis, abdominal pain or other complaints.     PCP: [facility]  Past Medical History:  Diagnosis Date  . AF (paroxysmal atrial fibrillation) ([place])   . Anxiety 01/20/11   "in the past; not now"  . Aortic valve disorder   . Arthritis   . Cardiomyopathy   . CHF (congestive heart failure) ([facility])    Dr. Wynonia Lawman  . Complication of anesthesia    aspiration  . Family history of anesthesia complication    nausea  . Fibroids   . GERD (gastroesophageal reflux disease)    otc  . H/O: hysterectomy    secondary to uterine prolapse  . Hypertension    essential- benign  . Hyperthyroidism   . Hypothyroidism   . ICD (implantable cardiac defibrillator) in place   . LBBB (left bundle branch block)    conduction  disorder  . Migraines   . Osteoporosis   . Pleural effusion, right    thoracentesis 02/09/09   . Pneumonia   . PONV (postoperative nausea and vomiting)   . Raynaud's disease   . Scoliosis   . Shortness of breath   . 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 Chronic systolic heart failure ([place]) 02/07/2014  . Trigeminal neuralgia 12/14/2012  . Insomnia, persistent 11/03/2011  . AICD (automatic cardioverter/defibrillator) present   . Aortic regurgitation   . Nonischemic cardiomyopathy ([place]) 01/21/2011  . Disc disease with myelopathy, lumbar 11/17/2010  . Congestive heart failure ([place]) 11/17/2010  . Constitutional aplastic anemia ([place]) 09/20/2010  . Esophagitis, reflux 08/24/2010  . Idiopathic peripheral neuropathy 08/24/2010  . Hypothyroidsim   . History of migraine headaches   . LBBB   . Scoliosis     Class: Chronic  . Lumbar canal stenosis 12/25/2009  . Anxiety state 07/24/2008  . Avitaminosis D 03/21/2008  . Atypical migraine 01/30/2008  . Benign essential HTN 01/30/2008  . Paroxysmal digital cyanosis 02/15/2007    Past Surgical History:  Procedure Laterality Date  . ARTERY BIOPSY  12/29/2011   Procedure: MINOR BIOPSY TEMPORAL ARTERY;  Surgeon: Edward Jolly, MD;  Location: [facility];  Service: General;  Laterality: Right;  Right temporal artery biopsy  . BACK SURGERY  12/31/2009   "for  flat back syndrome; fused bottom of my back"  . BI-VENTRICULAR IMPLANTABLE CARDIOVERTER DEFIBRILLATOR Left 01/20/2011   Procedure: BI-VENTRICULAR IMPLANTABLE CARDIOVERTER DEFIBRILLATOR  (CRT-D);  Surgeon: Evans Lance, MD;  Location: [facility] CATH LAB;  Service: Cardiovascular;  Laterality: Left;  . CARDIAC DEFIBRILLATOR PLACEMENT  01/20/11  . CHOLECYSTECTOMY  ~1996  . FOOT NEUROMA SURGERY  ~ 2003   right  . ROTATOR CUFF REPAIR  ~ 2009   left  . scoliosis surgery    . SHOULDER ARTHROSCOPY  W/ ROTATOR CUFF REPAIR  09/21/2011  . SKIN CANCER EXCISION     nose, forehead, left leg  . THYROID LOBECTOMY  ~ 2006   right  . TUBAL LIGATION  ~ 1983  . VAGINAL HYSTERECTOMY  ~ 2009  . VESICOVAGINAL FISTULA CLOSURE W/ TAH      OB History    No data available       Home Medications    Prior to Admission medications   Medication Sig Start Date End Date Taking? Authorizing Provider  acetaminophen (TYLENOL) 325 MG tablet Take 650 mg by mouth every 6 (six) hours as needed for mild pain.     Historical Provider, MD  alendronate (FOSAMAX) 10 MG tablet Take 10 mg by mouth once a week. Take with a full glass of water on an empty stomach. On sundays    Historical Provider, MD  carisoprodol (SOMA) 350 MG tablet Take 350 mg by mouth 3 (three) times daily. 06/04/15   Historical Provider, MD  carvedilol (COREG) 3.125 MG tablet Take 3.125 mg by mouth 2 (two) times daily. 01/02/14   Historical Provider, MD  cephALEXin (KEFLEX) 500 MG capsule Take 1 capsule (500 mg total) by mouth 3 (three) times daily. 10/28/15   Tanna Furry, MD  eletriptan (RELPAX) 40 MG tablet Take 40 mg by mouth daily as needed. For migraine. may repeat in 2 hours if necessary    Historical Provider, MD  fluticasone (FLONASE) 50 MCG/ACT nasal spray Place 1 spray into both nostrils daily as needed for allergies or rhinitis.    Historical Provider, MD  furosemide (LASIX) 40 MG tablet Take 20 mg by mouth daily.    Historical Provider, MD  gabapentin (NEURONTIN) 600 MG tablet Take 1 tablet (600 mg total) by mouth 3 (three) times daily. 12/14/12   Penni Bombard, MD  imipramine (TOFRANIL) 25 MG tablet Take 75 mg by mouth at bedtime.     Historical Provider, MD  levothyroxine (SYNTHROID, LEVOTHROID) 112 MCG tablet Take 112 mcg by mouth daily before breakfast.    Historical Provider, MD  lisinopril (PRINIVIL,ZESTRIL) 2.5 MG tablet Take 2.5 mg by mouth daily.      Historical Provider, MD  Magnesium 250 MG TABS Take 1 tablet by mouth 2  (two) times daily.    Historical Provider, MD  primidone (MYSOLINE) 50 MG tablet Take 1 tablet (50 mg total) by mouth at bedtime. 01/28/14   Penni Bombard, MD  spironolactone (ALDACTONE) 25 MG tablet Take 12.5 mg by mouth daily.     Historical Provider, MD  traMADol (ULTRAM) 50 MG tablet Take 50 mg by mouth 2 (two) times daily. 06/04/15   Historical Provider, MD    Family History Family History  Problem Relation Age of Onset  . Heart disease Mother   . Heart failure Mother 23    CHF  . Stroke Mother   . Hypertension Mother   . Heart disease Father 20  . Macular degeneration Father   . Heart disease  Sister   . Atrial fibrillation Sister   . Heart disease Sister     Social History Social History  Substance Use Topics  . Smoking status: Never Smoker  . Smokeless tobacco: Never Used  . Alcohol use No     Allergies   Codeine; Fentanyl; Morphine and related; and Robaxin [methocarbamol]   Review of Systems Review of Systems  Constitutional: Negative for chills and fever.  HENT: Negative.   Respiratory: Negative for cough and shortness of breath.   Cardiovascular: Negative for chest pain and palpitations.  Gastrointestinal: Negative for abdominal pain and nausea.  Musculoskeletal: Negative.   Skin: Positive for color change and wound.  Neurological: Negative for dizziness, syncope, weakness, light-headedness and headaches.  Hematological: Bruises/bleeds easily.  All other systems reviewed and are negative.    Physical Exam Updated Vital Signs Vitals:   10/30/15 2031 10/30/15 2325  BP: 127/73 125/77  Pulse: 102 83  Resp: 20 18  Temp: 98.1 F (36.7 C)      Physical Exam  Constitutional: She appears well-developed and well-nourished. No distress.  HENT:  Head: Normocephalic and atraumatic.  Mouth/Throat: Oropharynx is clear and moist.  Eyes: Conjunctivae are normal. Right eye exhibits no discharge. Left eye exhibits no discharge. No scleral icterus.  Neck:  Normal range of motion. Neck supple. No thyromegaly present.  Cardiovascular: Normal rate, regular rhythm, normal heart sounds and intact distal pulses.  Exam reveals no gallop and no friction rub.   No murmur heard. Pulmonary/Chest: Effort normal and breath sounds normal.  Abdominal: Soft. Bowel sounds are normal. She exhibits no distension. There is no tenderness. There is no rebound and no guarding.  Musculoskeletal: Normal range of motion. She exhibits no edema.  Lymphadenopathy:    She has no cervical adenopathy.  Neurological: She is alert.  Skin: Skin is warm and dry. Capillary refill takes less than 2 seconds.     Several small echymosis to bilateral arms. Patients states this is baseline and bruises easily due to thin skin.  Nursing note and vitals reviewed.    ED Treatments / Results  DIAGNOSTIC STUDIES: Oxygen Saturation is 98% on RA, normal by my interpretation.    COORDINATION OF CARE: 11:35 PM- Pt advised of plan for treatment and pt agrees.  Labs (all labs ordered are listed, but only abnormal results are displayed) Labs Reviewed - No data to display  EKG  EKG Interpretation None       Radiology No results found.  Procedures Procedures (including critical care time)  Medications Ordered in ED Medications  diphenhydrAMINE (BENADRYL) capsule 50 mg (50 mg Oral Given 10/31/15 0030)  famotidine (PEPCID) tablet 20 mg (20 mg Oral Given 10/31/15 0030)  ampicillin-sulbactam (UNASYN) 1.5 g in sodium chloride 0.9 % 50 mL IVPB (1.5 g Intravenous New Bag/Given 10/31/15 0030)     Initial Impression / Assessment and Plan / ED Course  I have reviewed the triage vital signs and the nursing notes.  Pertinent labs & imaging results that were available during my care of the patient were reviewed by me and considered in my medical decision making (see chart for details).  Clinical Course  Patient presents to ED for recheck. Erythema is still localized to area of  injury. Concerning for cellulitis.Patient has been on Keflex for 2 days. She is afebrile and not tachycardic. Mild leukocytosis of 12 noted. All the labs unremarkable. Patient with pruritus of area. Given Pepcid and Benadryl in ED with significant relief. She was given a dose  of IV Unasyn in ED. She will be discharged home on Augmentin to broaden coverage. Area of erythema and edema was marked. She has been advised to keep her appointment with her PCP on Monday for wound recheck. She was encouraged to return to the ED if the redness significantly worsens or if she develops fever. She was given strict return precautions. She was advised to use Benadryl and Zantac at home for pruritus. Patient reports understanding the plan of care. Patient was seen and evaluated by Dr. Tomi Bamberger who agrees with the above plan. Patient is hemodynamically stable. She was discharged home in no acute distress Vital signs.  Final Clinical Impressions(s) / ED Diagnoses   Final diagnoses:  Cellulitis of left lower extremity    New Prescriptions New Prescriptions   AMOXICILLIN-CLAVULANATE (AUGMENTIN) 875-125 MG TABLET    Take 1 tablet by mouth 2 (two) times daily.   I personally performed the services described in this documentation, which was scribed in my presence. The recorded information has been reviewed and is accurate.     Doristine Devoid, PA-C 10/31/15 0124    Dorie Rank, MD 10/31/15 [phone]

## 2015-10-31 DIAGNOSIS — L03116 Cellulitis of left lower limb: Secondary | ICD-10-CM | POA: Diagnosis not present

## 2015-10-31 LAB — BASIC METABOLIC PANEL
Anion gap: 5 (ref 5–15)
BUN: 17 mg/dL (ref 6–20)
CO2: 30 mmol/L (ref 22–32)
Calcium: 8.8 mg/dL — ABNORMAL LOW (ref 8.9–10.3)
Chloride: 103 mmol/L (ref 101–111)
Creatinine, Ser: 0.91 mg/dL (ref 0.44–1.00)
GFR calc Af Amer: >60 mL/min (ref >60)
GFR calc non Af Amer: >60 mL/min (ref >60)
Glucose, Bld: 108 mg/dL — ABNORMAL HIGH (ref 65–99)
Potassium: 3.6 mmol/L (ref 3.5–5.1)
Sodium: 138 mmol/L (ref 135–145)

## 2015-10-31 LAB — CBC WITH DIFFERENTIAL/PLATELET
Basophils Absolute: 0 10*3/uL (ref 0.0–0.1)
Basophils Relative: 0 %
Eosinophils Absolute: 0.4 10*3/uL (ref 0.0–0.7)
Eosinophils Relative: 3 %
HCT: 42.1 % (ref 36.0–46.0)
Hemoglobin: 14.3 g/dL (ref 12.0–15.0)
Lymphocytes Relative: 20 %
Lymphs Abs: 2.4 10*3/uL (ref 0.7–4.0)
MCH: 31.4 pg (ref 26.0–34.0)
MCHC: 34 g/dL (ref 30.0–36.0)
MCV: 92.5 fL (ref 78.0–100.0)
Monocytes Absolute: 1.3 10*3/uL — ABNORMAL HIGH (ref 0.1–1.0)
Monocytes Relative: 11 %
Neutro Abs: 7.9 10*3/uL — ABNORMAL HIGH (ref 1.7–7.7)
Neutrophils Relative %: 66 %
Platelets: 343 10*3/uL (ref 150–400)
RBC: 4.55 MIL/uL (ref 3.87–5.11)
RDW: 13.9 % (ref 11.5–15.5)
WBC: 12.1 10*3/uL — ABNORMAL HIGH (ref 4.0–10.5)

## 2015-10-31 MED ORDER — AMOXICILLIN-POT CLAVULANATE 875-125 MG PO TABS: 1.0000 | ORAL_TABLET | Freq: Two times a day (BID) | ORAL | 0 refills | Status: DC

## 2015-10-31 MED ORDER — SODIUM CHLORIDE 0.9 % IV SOLN
1.5000 g | Freq: Once | INTRAVENOUS | Status: AC
  Administered 2015-10-31: 1.5 g via INTRAVENOUS
  Filled 2015-10-31: qty 1.5

## 2015-10-31 MED ORDER — AMOXICILLIN-POT CLAVULANATE 875-125 MG PO TABS
1.0000 | ORAL_TABLET | Freq: Once | ORAL | Status: AC
  Administered 2015-10-31: 1 via ORAL
  Filled 2015-10-31: qty 1

## 2015-10-31 NOTE — Discharge Instructions (Signed)
You may use Benadryl and Zantac at home for itching. Please stop taking the Keflex. You will start taking the Augmentin today. Please keep your appointment with your primary doctor on Monday for wound recheck. Please return to the ED if the redness gets worse or you develop fevers.

## 2015-10-31 NOTE — ED Notes (Signed)
Patient is alert and oriented x3.  She was given DC instructions and follow up visit instructions.  Patient gave verbal understanding. She was DC ambulatory under her own power to home.  V/S stable.  He was not showing any signs of distress on DC 

## 2015-11-02 DIAGNOSIS — L03116 Cellulitis of left lower limb: Secondary | ICD-10-CM | POA: Diagnosis not present

## 2015-11-02 DIAGNOSIS — L03114 Cellulitis of left upper limb: Secondary | ICD-10-CM | POA: Diagnosis not present

## 2015-11-08 DIAGNOSIS — I447 Left bundle-branch block, unspecified: Secondary | ICD-10-CM | POA: Diagnosis not present

## 2015-11-08 DIAGNOSIS — E039 Hypothyroidism, unspecified: Secondary | ICD-10-CM | POA: Diagnosis not present

## 2015-11-08 DIAGNOSIS — Z95810 Presence of automatic (implantable) cardiac defibrillator: Secondary | ICD-10-CM | POA: Diagnosis not present

## 2015-11-08 DIAGNOSIS — I10 Essential (primary) hypertension: Secondary | ICD-10-CM | POA: Diagnosis not present

## 2015-11-08 DIAGNOSIS — I359 Nonrheumatic aortic valve disorder, unspecified: Secondary | ICD-10-CM | POA: Diagnosis not present

## 2015-11-08 DIAGNOSIS — I429 Cardiomyopathy, unspecified: Secondary | ICD-10-CM | POA: Diagnosis not present

## 2015-11-08 DIAGNOSIS — I428 Other cardiomyopathies: Secondary | ICD-10-CM | POA: Diagnosis not present

## 2015-11-12 DIAGNOSIS — E538 Deficiency of other specified B group vitamins: Secondary | ICD-10-CM | POA: Diagnosis not present

## 2015-11-16 DIAGNOSIS — R21 Rash and other nonspecific skin eruption: Secondary | ICD-10-CM | POA: Diagnosis not present

## 2015-11-16 DIAGNOSIS — L03116 Cellulitis of left lower limb: Secondary | ICD-10-CM | POA: Diagnosis not present

## 2015-11-19 DIAGNOSIS — G518 Other disorders of facial nerve: Secondary | ICD-10-CM | POA: Diagnosis not present

## 2015-11-19 DIAGNOSIS — M5442 Lumbago with sciatica, left side: Secondary | ICD-10-CM | POA: Diagnosis not present

## 2015-11-19 DIAGNOSIS — R202 Paresthesia of skin: Secondary | ICD-10-CM | POA: Diagnosis not present

## 2015-11-19 DIAGNOSIS — M5441 Lumbago with sciatica, right side: Secondary | ICD-10-CM | POA: Diagnosis not present

## 2015-12-01 DIAGNOSIS — L539 Erythematous condition, unspecified: Secondary | ICD-10-CM | POA: Diagnosis not present

## 2015-12-01 DIAGNOSIS — R300 Dysuria: Secondary | ICD-10-CM | POA: Diagnosis not present

## 2015-12-01 DIAGNOSIS — S81812A Laceration without foreign body, left lower leg, initial encounter: Secondary | ICD-10-CM | POA: Diagnosis not present

## 2015-12-01 DIAGNOSIS — R238 Other skin changes: Secondary | ICD-10-CM | POA: Diagnosis not present

## 2015-12-01 DIAGNOSIS — S80812A Abrasion, left lower leg, initial encounter: Secondary | ICD-10-CM | POA: Diagnosis not present

## 2015-12-16 DIAGNOSIS — Z1231 Encounter for screening mammogram for malignant neoplasm of breast: Secondary | ICD-10-CM | POA: Diagnosis not present

## 2016-06-20 ENCOUNTER — Encounter: Payer: Self-pay | Admitting: Internal Medicine

## 2016-06-20 ENCOUNTER — Encounter (INDEPENDENT_AMBULATORY_CARE_PROVIDER_SITE_OTHER): Payer: Self-pay

## 2016-06-20 ENCOUNTER — Ambulatory Visit (INDEPENDENT_AMBULATORY_CARE_PROVIDER_SITE_OTHER): Payer: Medicare Other | Admitting: Internal Medicine

## 2016-06-20 VITALS — BP 90/60 | HR 55 | Ht 67.0 in | Wt 161.0 lb

## 2016-06-20 DIAGNOSIS — I5022 Chronic systolic (congestive) heart failure: Secondary | ICD-10-CM

## 2016-06-20 DIAGNOSIS — Z95810 Presence of automatic (implantable) cardiac defibrillator: Secondary | ICD-10-CM

## 2016-06-20 NOTE — Patient Instructions (Signed)

## 2016-06-20 NOTE — Progress Notes (Signed)
HPI [name] returns today for followup. She is a very pleasant 61 year old woman with a nonischemic cardiomyopathy, chronic systolic heart failure, left bundle branch block, status post biventricular ICD implantation. In the interim, she has been stable. She notes that her CHF symptoms are actually improved.  She has had no frank syncope and no ICD shocks. She is approaching ERI on her device. Allergies  Allergen Reactions  . Codeine Other (See Comments)    Chest pain  . Fentanyl Other (See Comments)    Bad dreams  . Morphine And Related Other (See Comments)    Bad dreams  . Robaxin [Methocarbamol] Other (See Comments)    Chest pain.     Current Outpatient Prescriptions  Medication Sig Dispense Refill  . acetaminophen (TYLENOL) 325 MG tablet Take 650 mg by mouth every 6 (six) hours as needed for mild pain.     Marland Kitchen alendronate (FOSAMAX) 10 MG tablet Take 10 mg by mouth once a week. Take with a full glass of water on an empty stomach. On sundays    . eletriptan (RELPAX) 40 MG tablet Take 40 mg by mouth daily as needed. For migraine. may repeat in 2 hours if necessary    . fluticasone (FLONASE) 50 MCG/ACT nasal spray Place 1 spray into both nostrils daily as needed for allergies or rhinitis.    . furosemide (LASIX) 40 MG tablet Take 20 mg by mouth daily.    Marland Kitchen gabapentin (NEURONTIN) 600 MG tablet Take 1 tablet (600 mg total) by mouth 3 (three) times daily. 270 tablet 4  . levothyroxine (SYNTHROID, LEVOTHROID) 112 MCG tablet Take 112 mcg by mouth daily before breakfast.    . propranolol (INDERAL) 60 MG tablet Take 60 mg by mouth daily.    Marland Kitchen spironolactone (ALDACTONE) 25 MG tablet Take 12.5 mg by mouth daily.      No current facility-administered medications for this visit.      Past Medical History:  Diagnosis Date  . AF (paroxysmal atrial fibrillation) ([place])   . Anxiety 01/20/11   "in the past; not now"  . Aortic valve disorder   . Arthritis   . Cardiomyopathy   . CHF (congestive  heart failure) (HCC)    Dr. Wynonia Lawman  . Complication of anesthesia    aspiration  . Family history of anesthesia complication    nausea  . Fibroids   . GERD (gastroesophageal reflux disease)    otc  . H/O: hysterectomy    secondary to uterine prolapse  . Hypertension    essential- benign  . Hyperthyroidism   . Hypothyroidism   . ICD (implantable cardiac defibrillator) in place   . LBBB (left bundle branch block)    conduction disorder  . Migraines   . Osteoporosis   . Pleural effusion, right    thoracentesis 02/09/09   . Pneumonia   . PONV (postoperative nausea and vomiting)   . Raynaud's disease   . Scoliosis   . Shortness of breath   . 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   Past Surgical History:  Procedure Laterality Date  . ARTERY BIOPSY  12/29/2011   Procedure: MINOR BIOPSY TEMPORAL ARTERY;  Surgeon: Edward Jolly, MD;  Location: [place];  Service: General;  Laterality: Right;  Right temporal artery biopsy  . BACK SURGERY  12/31/2009   "for flat back syndrome;  fused bottom of my back"  . BI-VENTRICULAR IMPLANTABLE CARDIOVERTER DEFIBRILLATOR Left 01/20/2011   Procedure: BI-VENTRICULAR IMPLANTABLE CARDIOVERTER DEFIBRILLATOR  (CRT-D);  Surgeon: Evans Lance, MD;  Location: [facility] C[facility];  Service: Cardiovascular;  Laterality: Left;  . CARDIAC DEFIBRILLATOR PLACEMENT  01/20/11  . CHOLECYSTECTOMY  ~1996  . FOOT NEUROMA SURGERY  ~ 2003   right  . ROTATOR CUFF REPAIR  ~ 2009   left  . scoliosis surgery    . SHOULDER ARTHROSCOPY W/ ROTATOR CUFF REPAIR  09/21/2011  . SKIN CANCER EXCISION     nose, forehead, left leg  . THYROID LOBECTOMY  ~ 2006   right  . TUBAL LIGATION  ~ 1983  . VAGINAL HYSTERECTOMY  ~ 2009  . VESICOVAGINAL FISTULA CLOSURE W/ TAH       Family History  Problem Relation Age of Onset  . Heart disease Mother   . Heart failure  Mother 23       CHF  . Stroke Mother   . Hypertension Mother   . Heart disease Father 6  . Macular degeneration Father   . Heart disease Sister   . Atrial fibrillation Sister   . Heart disease Sister      Social History   Social History  . Marital status: Married    Spouse name: Ronalee Belts  . Number of children: 1  . Years of education: MA   Occupational History  . Teacher     n/a   Social History Main Topics  . Smoking status: Never Smoker  . Smokeless tobacco: Never Used  . Alcohol use No  . Drug use: No  . Sexual activity: Yes   Other Topics Concern  . Not on file   Social History Narrative   Patient lives at home with family.   Caffeine Use: 1-2 cups daily     BP 90/60   Pulse (!) 55   Ht 5\' 7"  (1.702 m)   Wt 161 lb (73 kg)   SpO2 96%   BMI 25.22 kg/m   Physical Exam:  Well appearing middle-aged woman, NAD HEENT: Unremarkable Neck:  6 cm JVD, no thyromegally Lungs:  Clear with no wheezes, rales, or rhonchi. Well-healed ICD incision. HEART:  Regular rate rhythm, no murmurs, no rubs, no clicks Abd:  soft, positive bowel sounds, no organomegally, no rebound, no guarding Ext:  2 plus pulses, no edema, no cyanosis, no clubbing Skin:  No rashes no nodules Neuro:  CN II through XII intact, motor grossly intact  ECG - NSR with Biv pacing, QRS -118  DEVICE  Normal device function.  See PaceArt for details. Approaching ERI  Assess/Plan:  1. Chronic systolic heart failure - her symptoms are class 2A. She will continue her current meds. She will maintain a low sodium diet. The patient reports that her EF is improved. If so, when she undergoes gen change would consider downgrade to a BiV PPM 2. ICD - her device is working normally. Will recheck in several months. She is approaching ERI 3. Chronic back pain - her symptoms appear to be better controlled than during the past visit. We will follow.   Mikle Bosworth.D.

## 2016-06-21 LAB — CUP PACEART INCLINIC DEVICE CHECK
Battery Voltage: 2.64 V
Brady Statistic AP VP Percent: 0.43 %
Brady Statistic AP VS Percent: 0.01 %
Brady Statistic AS VP Percent: 99.53 %
Brady Statistic AS VS Percent: 0.04 %
Brady Statistic RA Percent Paced: 0.44 %
Brady Statistic RV Percent Paced: 99.92 %
Date Time Interrogation Session: 20180611204420
HighPow Impedance: 82 Ohm
Implantable Lead Implant Date: 20130110
Implantable Lead Implant Date: 20130110
Implantable Lead Implant Date: 20130110
Implantable Lead Location: 753858
Implantable Lead Location: 753859
Implantable Lead Location: 753860
Implantable Lead Model: 4196
Implantable Lead Model: 5076
Implantable Lead Model: 6935
Implantable Pulse Generator Implant Date: 20130110
Lead Channel Impedance Value: 1254 Ohm
Lead Channel Impedance Value: 475 Ohm
Lead Channel Impedance Value: 627 Ohm
Lead Channel Impedance Value: 684 Ohm
Lead Channel Impedance Value: 855 Ohm
Lead Channel Pacing Threshold Amplitude: 0.625 V
Lead Channel Pacing Threshold Amplitude: 0.75 V
Lead Channel Pacing Threshold Amplitude: 1.75 V
Lead Channel Pacing Threshold Pulse Width: 0.4 ms
Lead Channel Pacing Threshold Pulse Width: 0.4 ms
Lead Channel Pacing Threshold Pulse Width: 0.8 ms
Lead Channel Sensing Intrinsic Amplitude: 1.375 mV
Lead Channel Sensing Intrinsic Amplitude: 1.625 mV
Lead Channel Sensing Intrinsic Amplitude: 18.375 mV
Lead Channel Sensing Intrinsic Amplitude: 19.5 mV
Lead Channel Setting Pacing Amplitude: 1.5 V
Lead Channel Setting Pacing Amplitude: 2 V
Lead Channel Setting Pacing Amplitude: 2.75 V
Lead Channel Setting Pacing Pulse Width: 0.4 ms
Lead Channel Setting Pacing Pulse Width: 0.8 ms
Lead Channel Setting Sensing Sensitivity: 0.3 mV

## 2016-10-18 ENCOUNTER — Ambulatory Visit (INDEPENDENT_AMBULATORY_CARE_PROVIDER_SITE_OTHER): Payer: Medicare Other | Admitting: Orthopaedic Surgery

## 2016-10-18 ENCOUNTER — Telehealth: Payer: Self-pay | Admitting: Cardiology

## 2016-10-18 ENCOUNTER — Ambulatory Visit (INDEPENDENT_AMBULATORY_CARE_PROVIDER_SITE_OTHER): Payer: Medicare Other

## 2016-10-18 ENCOUNTER — Encounter (INDEPENDENT_AMBULATORY_CARE_PROVIDER_SITE_OTHER): Payer: Self-pay | Admitting: Orthopaedic Surgery

## 2016-10-18 VITALS — BP 102/65 | HR 56 | Ht 67.0 in | Wt 159.0 lb

## 2016-10-18 DIAGNOSIS — M47812 Spondylosis without myelopathy or radiculopathy, cervical region: Secondary | ICD-10-CM

## 2016-10-18 DIAGNOSIS — M542 Cervicalgia: Secondary | ICD-10-CM | POA: Diagnosis not present

## 2016-10-18 MED ORDER — PREDNISONE 5 MG PO TABS: 5.0000 mg | ORAL_TABLET | Freq: Every day | ORAL | 0 refills | Status: DC

## 2016-10-18 NOTE — Telephone Encounter (Signed)
Patient paged after hours - reports that her device is beeping. Denies ICD shock. Spoke with Dr. Rayann Heman who advised that she do a manual transmission and will have device clinic look at the report in the am.   Arbutus Leas, NP-C 7:55 PM

## 2016-10-19 ENCOUNTER — Telehealth: Payer: Self-pay

## 2016-10-19 ENCOUNTER — Ambulatory Visit (INDEPENDENT_AMBULATORY_CARE_PROVIDER_SITE_OTHER): Payer: Medicare Other | Admitting: *Deleted

## 2016-10-19 DIAGNOSIS — I5022 Chronic systolic (congestive) heart failure: Secondary | ICD-10-CM

## 2016-10-19 LAB — CUP PACEART INCLINIC DEVICE CHECK
Date Time Interrogation Session: 20181010162815
Implantable Lead Implant Date: 20130110
Implantable Lead Implant Date: 20130110
Implantable Lead Implant Date: 20130110
Implantable Lead Location: 753858
Implantable Lead Location: 753859
Implantable Lead Location: 753860
Implantable Lead Model: 4196
Implantable Lead Model: 5076
Implantable Lead Model: 6935
Implantable Pulse Generator Implant Date: 20130110

## 2016-10-19 NOTE — Progress Notes (Signed)
Device checked in clinic for alert tone~ device reached ERI 10/17/2016. ROV w/ AS 11/02/2016 to discuss gen change.

## 2016-10-19 NOTE — Telephone Encounter (Signed)
Spoke with [name] informed her that her she was enrolled in Dr. Thurman Coyer clinic for her remote transmission and therefore I could not see the transmission. [name] agreeable to appointment today at 4:00pm with device clinic to have device interrogated.

## 2016-10-21 NOTE — Progress Notes (Signed)
Office Visit Note   Patient: [name]           Date of Birth: 07-Jul-1955           MRN: [id number] Visit Date: 10/18/2016              Requested by: [facility] [address], [place] [address] PCP: Pa, Banks: Visit Diagnoses:  1. Neck pain   2. Spondylosis without myelopathy or radiculopathy, cervical region     Plan: Activity modification discussed. We discussed exercises she should avoid what she does pool therapy or other workout activities. Prednisone pack ordered. We will recheck her in one month. She has increasing symptoms we can consider repeating cervical spine CT scan since she cannot have an MRI with her pacemaker/defibrillator. We discussed radicular symptoms she should be aware of it they develop she'll let us know. Recheck 1 month.  Follow-Up Instructions: Return in about 1 month (around 11/18/2016).   Orders:  Orders Placed This Encounter  Procedures  . XR Cervical Spine 2 or 3 views   Meds ordered this encounter  Medications  . predniSONE (DELTASONE) 5 MG tablet    Sig: Take 1 tablet (5 mg total) by mouth daily with breakfast.    Dispense:  21 tablet    Refill:  0      Procedures: No procedures performed   Clinical Data: No additional findings.   Subjective: Chief Complaint  Patient presents with  . 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 She's tried to do water aerobics lifting her arms up overhead and trying to do some jumping she had increased pain primarily on the left side. She denies numbness or tingling in her hands some time she is applied rubs or cream which is helped. Previous scoliosis surgery originally 33 and then later revision 2010. She is on chronic gabapentin as well as amitriptyline. She denies gait disturbance.  Review of Systems positive for scoliosis with thoracolumbar fusion 67 and then  later revision 2010. Implanted cardiac defibrillator. Previous thyroid surgery gallbladder removal rotator cuff repair 2. Positive for anxiety skin cancer heart disease hypertension migraines history of mononucleosis M osteoporosis scarlet fever thyroid condition. She takes Lasix and spironolactone as well as uses diclofenac topical gel on her neck   Objective: Vital Signs: BP 102/65   Pulse (!) 56   Ht 5\' 7"  (1.702 m)   Wt 159 lb (72.1 kg)   BMI 24.90 kg/m   Physical Exam  Constitutional: She is oriented to person, place, and time. She appears well-developed.  HENT:  Head: Normocephalic.  Right Ear: External ear normal.  Left Ear: External ear normal.  Eyes: Pupils are equal, round, and reactive to light.  Neck: No tracheal deviation present. No thyromegaly present.  Cardiovascular: Normal rate.   Pulmonary/Chest: Effort normal.  Abdominal: Soft.  Neurological: She is alert and oriented to person, place, and time.  Skin: Skin is warm and dry.  Psychiatric: She has a normal mood and affect. Her behavior is normal.    Ortho Exam patient has no suprapubic or lymphadenopathy palpable pacemaker defibrillator left anterior chest wall. She has some brachial plexus tenderness both right and left and trapezial tenderness paraspinal tenderness cervical spine. Well-healed long thoracolumbar incision no palpable rods incision is well-healed. Reflexes upper and lower extremities are 2+ and symmetrical.  Specialty Comments:  No specialty  comments available.  Imaging: CT CERVICAL SPINE FINDINGS  Imaging was obtained from the skullbase through the T1-2 interspace. No evidence for fracture. Loss of disc height with endplate degeneration is seen at C5-6 and to a lesser degree at C6-7. Prominent left-sided facet osteoarthritis is seen at C3-4 on the right. No evidence for prevertebral soft tissue swelling.  IMPRESSION: No acute intracranial abnormality.  Chronic ethmoid and sphenoid  sinus disease.  No evidence for cervical spine fracture with chronic degenerative disc disease at see 5 6 and C6-7.   Electronically Signed   By: Misty Stanley M.D.   On: 06/24/2013 10:54    PMFS History: Patient Active Problem List   Diagnosis Date Noted  . Chronic systolic heart failure ([place]) 02/07/2014  . Trigeminal neuralgia 12/14/2012  . Insomnia, persistent 11/03/2011  . AICD (automatic cardioverter/defibrillator) present   . Aortic regurgitation   . Nonischemic cardiomyopathy ([place]) 01/21/2011  . Disc disease with myelopathy, lumbar 11/17/2010  . Congestive heart failure ([place]) 11/17/2010  . 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 Esophagitis, reflux 08/24/2010  . Idiopathic peripheral neuropathy 08/24/2010  . Hypothyroidsim   . History of migraine headaches   . LBBB   . Scoliosis     Class: Chronic  . Lumbar canal stenosis 12/25/2009  . Anxiety state 07/24/2008  . Avitaminosis D 03/21/2008  . Atypical migraine 01/30/2008  . Benign essential HTN 01/30/2008  . Paroxysmal digital cyanosis 02/15/2007   Past Medical History:  Diagnosis Date  . AF (paroxysmal atrial fibrillation) ([place])   . Anxiety 01/20/11   "in the past; not now"  . Aortic valve disorder   . Arthritis   . Cardiomyopathy   . CHF (congestive heart failure) ([facility])    Dr. Wynonia Lawman  . Complication of anesthesia    aspiration  . Family history of anesthesia complication    nausea  . Fibroids   . GERD (gastroesophageal reflux disease)    otc  . H/O: hysterectomy    secondary to uterine prolapse  . Hypertension    essential- benign  . Hyperthyroidism   . Hypothyroidism   . ICD (implantable cardiac defibrillator) in place   . LBBB (left bundle branch block)    conduction disorder  . Migraines   . Osteoporosis   . Pleural effusion, right    thoracentesis 02/09/09   . Pneumonia   . PONV (postoperative nausea and vomiting)   . Raynaud's disease   . Scoliosis   . Shortness of  breath   . 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 Heart disease Mother   . Heart failure Mother 1       CHF  . Stroke Mother   . Hypertension Mother   . Heart disease Father 43  . Macular degeneration Father   . Heart disease Sister   . Atrial fibrillation Sister   . Heart disease Sister     Past Surgical History:  Procedure Laterality Date  . ARTERY BIOPSY  12/29/2011   Procedure: MINOR BIOPSY TEMPORAL ARTERY;  Surgeon: Edward Jolly, MD;  Location: [place];  Service: General;  Laterality: Right;  Right temporal artery biopsy  . BACK SURGERY  12/31/2009   "for flat back syndrome; fused bottom of my back"  . BI-VENTRICULAR IMPLANTABLE CARDIOVERTER DEFIBRILLATOR Left 01/20/2011   Procedure: BI-VENTRICULAR IMPLANTABLE CARDIOVERTER DEFIBRILLATOR  (CRT-D);  Surgeon: Evans Lance, MD;  Location: [place] CATH LAB;  Service: Cardiovascular;  Laterality: Left;  . CARDIAC DEFIBRILLATOR PLACEMENT  01/20/11  . CHOLECYSTECTOMY  ~1996  . FOOT NEUROMA SURGERY  ~ 2003   right  . ROTATOR CUFF REPAIR  ~ 2009   left  . scoliosis surgery    . SHOULDER ARTHROSCOPY W/ ROTATOR CUFF REPAIR  09/21/2011  . SKIN CANCER EXCISION     nose, forehead, left leg  . THYROID LOBECTOMY  ~ 2006   right  . TUBAL LIGATION  ~ 1983  . VAGINAL HYSTERECTOMY  ~ 2009  . VESICOVAGINAL FISTULA CLOSURE W/ TAH     Social History   Occupational History  . Teacher     n/a   Social History Main Topics  . Smoking status: Never Smoker  . Smokeless tobacco: Never Used  . Alcohol use No  . Drug use: No  . Sexual activity: Yes

## 2016-11-01 ENCOUNTER — Encounter: Payer: Self-pay | Admitting: Nurse Practitioner

## 2016-11-01 ENCOUNTER — Ambulatory Visit (INDEPENDENT_AMBULATORY_CARE_PROVIDER_SITE_OTHER): Payer: Medicare Other | Admitting: Orthopaedic Surgery

## 2016-11-01 NOTE — Progress Notes (Signed)
Electrophysiology Office Note Date: 11/02/2016  ID:  [name], DOB Feb 01, 1955, MRN [id number]  PCP: [facility]  Cardiologist: Wynonia Lawman Electrophysiologist: Lovena Le  CC: ICD at [facility] is a 61 y.o. female seen today for Dr Lovena Le.  She presents today for routine electrophysiology followup.  Since last being seen in our clinic, the patient reports doing very well.  Her ICD has been found to be at [facility]. She reports that last year with Dr Wynonia Lawman, her EF had improved. Since that time, she has had some increased shortness of breath with exertion.  She denies chest pain, palpitations, PND, orthopnea, nausea, vomiting, dizziness, syncope, edema, weight gain, or early satiety.  She has not had ICD shocks.   Device History: MRT CRTD implanted 2013 for NICM, CHF History of appropriate therapy: No History of AAD therapy: No   Past Medical History:  Diagnosis Date  . AF (paroxysmal atrial fibrillation) ([place])   . Aortic valve disorder   . Arthritis   . CHF (congestive heart failure) ([place])   . Complication of anesthesia    aspiration  . Fibroids   . GERD (gastroesophageal reflux disease)    otc  . Hypertension   . Hyperthyroidism   . Hypothyroidism   . LBBB (left bundle branch block)    conduction disorder  . Migraines   . Osteoporosis   . Pleural effusion, right    thoracentesis 02/09/09   . Raynaud's disease   . Scoliosis   . 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 ARTERY BIOPSY  12/29/2011   Procedure: MINOR BIOPSY TEMPORAL ARTERY;  Surgeon: Edward Jolly, MD;  Location: [place];  Service: General;  Laterality: Right;  Right temporal artery biopsy  . BACK SURGERY  12/31/2009   "for flat back syndrome; fused bottom of my back"  . BI-VENTRICULAR IMPLANTABLE CARDIOVERTER DEFIBRILLATOR Left 01/20/2011   Procedure: BI-VENTRICULAR  IMPLANTABLE CARDIOVERTER DEFIBRILLATOR  (CRT-D);  Surgeon: Evans Lance, MD;  Location: [facility] CATH LAB;  Service: Cardiovascular;  Laterality: Left;  . CHOLECYSTECTOMY  ~1996  . FOOT NEUROMA SURGERY  ~ 2003   right  . ROTATOR CUFF REPAIR  ~ 2009   left  . SHOULDER ARTHROSCOPY W/ ROTATOR CUFF REPAIR  09/21/2011  . SKIN CANCER EXCISION     nose, forehead, left leg  . THYROID LOBECTOMY  ~ 2006   right  . TUBAL LIGATION  ~ 1983  . VAGINAL HYSTERECTOMY  ~ 2009  . VESICOVAGINAL FISTULA CLOSURE W/ TAH      Current Outpatient Prescriptions  Medication Sig Dispense Refill  . acetaminophen (TYLENOL) 325 MG tablet Take 650 mg by mouth every 6 (six) hours as needed for mild pain.     Marland Kitchen AFLURIA QUADRIVALENT 0.5 ML injection TO BE ADMINISTERED BY PHARMACIST FOR IMMUNIZATION  0  . amitriptyline (ELAVIL) 25 MG tablet TAKE 1.5-2 TABLETS ONCE A NIGHT ORALLY 30 DAY(S)  5  . diclofenac sodium (VOLTAREN) 1 % GEL See admin instructions.  3  . eletriptan (RELPAX) 40 MG tablet Take 40 mg by mouth daily as needed. For migraine. may repeat in 2 hours if necessary    . fluticasone (FLONASE) 50 MCG/ACT nasal spray Place 1 spray into both nostrils daily as needed for allergies or rhinitis.    . furosemide (LASIX) 40 MG tablet Take 20 mg by mouth daily.    Marland Kitchen  gabapentin (NEURONTIN) 600 MG tablet Take 1 tablet (600 mg total) by mouth 3 (three) times daily. 270 tablet 4  . levothyroxine (SYNTHROID, LEVOTHROID) 112 MCG tablet Take 112 mcg by mouth daily before breakfast.    . predniSONE (DELTASONE) 5 MG tablet Take 1 tablet (5 mg total) by mouth daily with breakfast. 21 tablet 0  . propranolol (INDERAL) 60 MG tablet Take 60 mg by mouth daily.    Marland Kitchen spironolactone (ALDACTONE) 25 MG tablet Take 12.5 mg by mouth daily.      No current facility-administered medications for this visit.     Allergies:   Codeine; Fentanyl; Morphine and related; and Robaxin [methocarbamol]   Social History: Social History   Social History   . Marital status: Married    Spouse name: Ronalee Belts  . Number of children: 1  . Years of education: MA   Occupational History  . Teacher     n/a   Social History Main Topics  . Smoking status: Never Smoker  . Smokeless tobacco: Never Used  . Alcohol use No  . Drug use: No  . Sexual activity: Yes   Other Topics Concern  . Not on file   Social History Narrative   Patient lives at home with family.   Caffeine Use: 1-2 cups daily    Family History: Family History  Problem Relation Age of Onset  . Heart disease Mother   . Heart failure Mother 34       CHF  . Stroke Mother   . Hypertension Mother   . Heart disease Father 85  . Macular degeneration Father   . Heart disease Sister   . Atrial fibrillation Sister   . Heart disease Sister     Review of Systems: All other systems reviewed and are otherwise negative except as noted above.   Physical Exam: VS:  BP 108/76   Pulse (!) 54   Ht 5' 7.5" (1.715 m)   Wt 161 lb (73 kg)   SpO2 98%   BMI 24.84 kg/m  , BMI Body mass index is 24.84 kg/m.  GEN- The patient is well appearing, alert and oriented x 3 today.   HEENT: normocephalic, atraumatic; sclera clear, conjunctiva pink; hearing intact; oropharynx clear; neck supple Lungs- Clear to ausculation bilaterally, normal work of breathing.  No wheezes, rales, rhonchi Heart- Regular rate and rhythm (paced) GI- soft, non-tender, non-distended, bowel sounds present Extremities- no clubbing, cyanosis, or edema MS- no significant deformity or atrophy Skin- warm and dry, no rash or lesion; ICD pocket well healed Psych- euthymic mood, full affect Neuro- strength and sensation are intact  ICD interrogation- reviewed in detail today,  See PACEART report  EKG:  EKG is ordered today. The ekg ordered today shows sinus rhythm with V pacing  Recent Labs: No results found for requested labs within last 8760 hours.   Wt Readings from Last 3 Encounters:  11/02/16 161 lb (73 kg)    10/18/16 159 lb (72.1 kg)  06/20/16 161 lb (73 kg)     Other studies Reviewed: Additional studies/ records that were reviewed today include: Dr Tanna Furry office notes   Assessment and Plan:  1.  Chronic systolic dysfunction euvolemic today Stable on an appropriate medical regimen Last echo a year ago with improved EF per patient. She has had some increased shortness of breath recently.  Will update echo prior to gen change.  She requests that echo be done at our office 2/2 pain in the past with tech at  Dr Thurman Coyer office.  ICD at [facility]. Risks, benefits to gen change reviewed with patient who wishes to proceed.  Per Dr Tanna Furry last note, will plan on CRTP downgrade at time of gen change if improvement in EF. Discussed [employer] with patient today if she is downgraded to CRTP  2.  HTN Stable No change required today   Current medicines are reviewed at length with the patient today.   The patient does not have concerns regarding her medicines.  The following changes were made today:  none  Labs/ tests ordered today include: none Orders Placed This Encounter  Procedures  . Basic metabolic panel  . CBC  . EKG 12-Lead  . ECHOCARDIOGRAM COMPLETE     Disposition:   Follow up with Dr Lovena Le after gen change      Signed, Chanetta Marshall, NP 11/02/2016 10:29 AM  [address] [place]  [place] [address] [phone] (office) [phone] (fax)

## 2016-11-02 ENCOUNTER — Ambulatory Visit (INDEPENDENT_AMBULATORY_CARE_PROVIDER_SITE_OTHER): Payer: Medicare Other | Admitting: Nurse Practitioner

## 2016-11-02 ENCOUNTER — Encounter: Payer: Self-pay | Admitting: Nurse Practitioner

## 2016-11-02 ENCOUNTER — Encounter: Payer: Self-pay | Admitting: *Deleted

## 2016-11-02 VITALS — BP 108/76 | HR 54 | Ht 67.5 in | Wt 161.0 lb

## 2016-11-02 DIAGNOSIS — I10 Essential (primary) hypertension: Secondary | ICD-10-CM | POA: Diagnosis not present

## 2016-11-02 DIAGNOSIS — I5022 Chronic systolic (congestive) heart failure: Secondary | ICD-10-CM | POA: Diagnosis not present

## 2016-11-02 NOTE — Patient Instructions (Addendum)
Medication Instructions:   Your physician recommends that you continue on your current medications as directed. Please refer to the Current Medication list given to you today.   If you need a refill on your cardiac medications before your next appointment, please call your pharmacy.  Labwork:  RETURN FOR LABS CBC AND BMET  BEGINNING OF THE WEEK NOV- 7-18    Testing/Procedures: SEE LETTER FOR GEN [place] ON NOV 15-18 WITH TAYLOR   BEFORE 11-24-16 Your physician has requested that you have an echocardiogram. Echocardiography is a painless test that uses sound waves to create images of your heart. It provides your doctor with information about the size and shape of your heart and how well your heart's chambers and valves are working. This procedure takes approximately one hour. There are no restrictions for this procedure.   Follow-Up:  After 11-24-16   10 DAY WOUND WITH DEVICE CLINIC   30 DAY PHYS [place] DR Lovena Le     Any Other Special Instructions Will Be Listed Below (If Applicable).

## 2016-11-03 LAB — CUP PACEART INCLINIC DEVICE CHECK
Date Time Interrogation Session: 20181025080119
Implantable Lead Implant Date: 20130110
Implantable Lead Implant Date: 20130110
Implantable Lead Implant Date: 20130110
Implantable Lead Location: 753858
Implantable Lead Location: 753859
Implantable Lead Location: 753860
Implantable Lead Model: 4196
Implantable Lead Model: 5076
Implantable Lead Model: 6935
Implantable Pulse Generator Implant Date: 20130110

## 2016-11-05 ENCOUNTER — Other Ambulatory Visit (INDEPENDENT_AMBULATORY_CARE_PROVIDER_SITE_OTHER): Payer: Self-pay | Admitting: Orthopaedic Surgery

## 2016-11-07 NOTE — Telephone Encounter (Signed)
OK - thanks

## 2016-11-07 NOTE — Telephone Encounter (Signed)
Ok for refill? 

## 2016-11-16 ENCOUNTER — Other Ambulatory Visit: Payer: Medicare Other

## 2016-11-16 ENCOUNTER — Other Ambulatory Visit: Payer: Self-pay

## 2016-11-16 ENCOUNTER — Ambulatory Visit (HOSPITAL_COMMUNITY): Payer: Medicare Other | Attending: Cardiovascular Disease

## 2016-11-16 DIAGNOSIS — I447 Left bundle-branch block, unspecified: Secondary | ICD-10-CM | POA: Insufficient documentation

## 2016-11-16 DIAGNOSIS — I5022 Chronic systolic (congestive) heart failure: Secondary | ICD-10-CM

## 2016-11-16 DIAGNOSIS — I110 Hypertensive heart disease with heart failure: Secondary | ICD-10-CM | POA: Insufficient documentation

## 2016-11-16 DIAGNOSIS — I371 Nonrheumatic pulmonary valve insufficiency: Secondary | ICD-10-CM | POA: Diagnosis not present

## 2016-11-16 DIAGNOSIS — R1110 Vomiting, unspecified: Secondary | ICD-10-CM | POA: Insufficient documentation

## 2016-11-16 DIAGNOSIS — I429 Cardiomyopathy, unspecified: Secondary | ICD-10-CM | POA: Diagnosis not present

## 2016-11-16 DIAGNOSIS — I082 Rheumatic disorders of both aortic and tricuspid valves: Secondary | ICD-10-CM | POA: Insufficient documentation

## 2016-11-16 MED ORDER — PERFLUTREN LIPID MICROSPHERE
1.0000 mL | INTRAVENOUS | Status: AC | PRN
  Administered 2016-11-16: 2 mL via INTRAVENOUS

## 2016-11-17 LAB — CBC
Hematocrit: 41.4 % (ref 34.0–46.6)
Hemoglobin: 14.1 g/dL (ref 11.1–15.9)
MCH: 30.8 pg (ref 26.6–33.0)
MCHC: 34.1 g/dL (ref 31.5–35.7)
MCV: 90 fL (ref 79–97)
Platelets: 319 10*3/uL (ref 150–379)
RBC: 4.58 x10E6/uL (ref 3.77–5.28)
RDW: 13.9 % (ref 12.3–15.4)
WBC: 8 10*3/uL (ref 3.4–10.8)

## 2016-11-17 LAB — BASIC METABOLIC PANEL
BUN/Creatinine Ratio: 23 (ref 12–28)
BUN: 25 mg/dL (ref 8–27)
CO2: 27 mmol/L (ref 20–29)
Calcium: 9.6 mg/dL (ref 8.7–10.3)
Chloride: 101 mmol/L (ref 96–106)
Creatinine, Ser: 1.08 mg/dL — ABNORMAL HIGH (ref 0.57–1.00)
GFR calc Af Amer: 63 mL/min/{1.73_m2} (ref >59)
GFR calc non Af Amer: 55 mL/min/{1.73_m2} — ABNORMAL LOW (ref >59)
Glucose: 78 mg/dL (ref 65–99)
Potassium: 4.2 mmol/L (ref 3.5–5.2)
Sodium: 142 mmol/L (ref 134–144)

## 2016-11-18 ENCOUNTER — Ambulatory Visit (INDEPENDENT_AMBULATORY_CARE_PROVIDER_SITE_OTHER): Payer: Medicare Other | Admitting: Orthopaedic Surgery

## 2016-11-24 ENCOUNTER — Ambulatory Visit (HOSPITAL_COMMUNITY)
Admission: RE | Admit: 2016-11-24 | Discharge: 2016-11-24 | Disposition: A | Discharge status: Home / Self Care | Payer: Medicare Other | Source: Ambulatory Visit | Attending: Internal Medicine | Admitting: Internal Medicine

## 2016-11-24 ENCOUNTER — Ambulatory Visit (HOSPITAL_COMMUNITY)
Admission: RE | Disposition: A | Discharge status: Home / Self Care | Payer: Self-pay | Source: Ambulatory Visit | Attending: Internal Medicine

## 2016-11-24 DIAGNOSIS — I7300 Raynaud's syndrome without gangrene: Secondary | ICD-10-CM | POA: Diagnosis not present

## 2016-11-24 DIAGNOSIS — Z7952 Long term (current) use of systemic steroids: Secondary | ICD-10-CM | POA: Insufficient documentation

## 2016-11-24 DIAGNOSIS — I110 Hypertensive heart disease with heart failure: Secondary | ICD-10-CM | POA: Diagnosis not present

## 2016-11-24 DIAGNOSIS — I428 Other cardiomyopathies: Secondary | ICD-10-CM | POA: Insufficient documentation

## 2016-11-24 DIAGNOSIS — Z95810 Presence of automatic (implantable) cardiac defibrillator: Secondary | ICD-10-CM | POA: Diagnosis present

## 2016-11-24 DIAGNOSIS — I5022 Chronic systolic (congestive) heart failure: Secondary | ICD-10-CM | POA: Diagnosis not present

## 2016-11-24 DIAGNOSIS — E039 Hypothyroidism, unspecified: Secondary | ICD-10-CM | POA: Diagnosis not present

## 2016-11-24 DIAGNOSIS — Z4502 Encounter for adjustment and management of automatic implantable cardiac defibrillator: Secondary | ICD-10-CM | POA: Diagnosis not present

## 2016-11-24 DIAGNOSIS — K219 Gastro-esophageal reflux disease without esophagitis: Secondary | ICD-10-CM | POA: Insufficient documentation

## 2016-11-24 DIAGNOSIS — M810 Age-related osteoporosis without current pathological fracture: Secondary | ICD-10-CM | POA: Diagnosis not present

## 2016-11-24 DIAGNOSIS — Z45010 Encounter for checking and testing of cardiac pacemaker pulse generator [battery]: Secondary | ICD-10-CM | POA: Diagnosis not present

## 2016-11-24 DIAGNOSIS — Z8249 Family history of ischemic heart disease and other diseases of the circulatory system: Secondary | ICD-10-CM | POA: Diagnosis not present

## 2016-11-24 DIAGNOSIS — G43909 Migraine, unspecified, not intractable, without status migrainosus: Secondary | ICD-10-CM | POA: Diagnosis not present

## 2016-11-24 DIAGNOSIS — Z7951 Long term (current) use of inhaled steroids: Secondary | ICD-10-CM | POA: Insufficient documentation

## 2016-11-24 DIAGNOSIS — M419 Scoliosis, unspecified: Secondary | ICD-10-CM | POA: Diagnosis not present

## 2016-11-24 HISTORY — PX: BIV ICD GENERATOR CHANGEOUT: EP1194

## 2016-11-24 LAB — SURGICAL PCR SCREEN
MRSA, PCR: NEGATIVE
Staphylococcus aureus: NEGATIVE

## 2016-11-24 SURGERY — BIV ICD GENERATOR CHANGEOUT

## 2016-11-24 MED ORDER — ACETAMINOPHEN 500 MG PO TABS: 1000.0000 mg | ORAL_TABLET | Freq: Once | ORAL | Status: DC

## 2016-11-24 MED ORDER — SODIUM CHLORIDE 0.9 % IR SOLN
Status: AC
  Filled 2016-11-24: qty 2

## 2016-11-24 MED ORDER — LIDOCAINE HCL (PF) 1 % IJ SOLN
INTRAMUSCULAR | Status: DC | PRN
  Administered 2016-11-24: 45 mL

## 2016-11-24 MED ORDER — SODIUM CHLORIDE 0.9 % IV SOLN
INTRAVENOUS | Status: DC
  Administered 2016-11-24: 10:00:00 via INTRAVENOUS

## 2016-11-24 MED ORDER — MIDAZOLAM HCL 5 MG/5ML IJ SOLN
INTRAMUSCULAR | Status: DC | PRN
  Administered 2016-11-24: 1 mg via INTRAVENOUS
  Administered 2016-11-24 (×3): 2 mg via INTRAVENOUS

## 2016-11-24 MED ORDER — CHLORHEXIDINE GLUCONATE 4 % EX LIQD
60.0000 mL | Freq: Once | CUTANEOUS | Status: DC
  Filled 2016-11-24: qty 60

## 2016-11-24 MED ORDER — CEFAZOLIN SODIUM-DEXTROSE 2-4 GM/100ML-% IV SOLN
2.0000 g | INTRAVENOUS | Status: AC
  Administered 2016-11-24: 2 g via INTRAVENOUS
  Filled 2016-11-24: qty 100

## 2016-11-24 MED ORDER — ACETAMINOPHEN 325 MG PO TABS: 325.0000 mg | ORAL_TABLET | ORAL | Status: DC | PRN

## 2016-11-24 MED ORDER — FENTANYL CITRATE (PF) 100 MCG/2ML IJ SOLN
INTRAMUSCULAR | Status: AC
  Filled 2016-11-24: qty 2

## 2016-11-24 MED ORDER — LIDOCAINE HCL (PF) 1 % IJ SOLN
INTRAMUSCULAR | Status: AC
  Filled 2016-11-24: qty 60

## 2016-11-24 MED ORDER — MIDAZOLAM HCL 5 MG/5ML IJ SOLN
INTRAMUSCULAR | Status: AC
  Filled 2016-11-24: qty 5

## 2016-11-24 MED ORDER — HEPARIN (PORCINE) IN NACL 2-0.9 UNIT/ML-% IJ SOLN
INTRAMUSCULAR | Status: AC | PRN
  Administered 2016-11-24: 500 mL

## 2016-11-24 MED ORDER — ONDANSETRON HCL 4 MG/2ML IJ SOLN: 4.0000 mg | Freq: Four times a day (QID) | INTRAMUSCULAR | Status: DC | PRN

## 2016-11-24 MED ORDER — MUPIROCIN 2 % EX OINT
TOPICAL_OINTMENT | CUTANEOUS | Status: AC
  Administered 2016-11-24: 1 via TOPICAL
  Filled 2016-11-24: qty 22

## 2016-11-24 MED ORDER — CEFAZOLIN SODIUM-DEXTROSE 2-4 GM/100ML-% IV SOLN
INTRAVENOUS | Status: AC
  Filled 2016-11-24: qty 100

## 2016-11-24 MED ORDER — MUPIROCIN 2 % EX OINT
1.0000 "application " | TOPICAL_OINTMENT | Freq: Once | CUTANEOUS | Status: AC
  Administered 2016-11-24: 1 via TOPICAL

## 2016-11-24 MED ORDER — SODIUM CHLORIDE 0.9 % IR SOLN
80.0000 mg | Status: AC
  Administered 2016-11-24: 80 mg
  Filled 2016-11-24: qty 2

## 2016-11-24 SURGICAL SUPPLY — 5 items
CABLE SURGICAL S-101-97-12 (CABLE) ×2 IMPLANT
PACEMAKER PRCT MRI CRTP W1TR01 (Pacemaker) ×1 IMPLANT
PAD DEFIB LIFELINK (PAD) ×2 IMPLANT
PPM PRECEPTA MRI CRT-P W1TR01 (Pacemaker) ×2 IMPLANT
TRAY PACEMAKER INSERTION (PACKS) ×2 IMPLANT

## 2016-11-24 NOTE — Discharge Instructions (Signed)

## 2016-11-24 NOTE — H&P (Signed)
Electrophysiology Office Note Date: 11/02/2016  ID:  [name], DOB 1955-10-25, MRN [id number]  PCP: [facility]  Cardiologist: Wynonia Lawman Electrophysiologist: Lovena Le  CC: ICD at [facility] is a 61 y.o. female seen today for Dr Lovena Le.  She presents today for routine electrophysiology followup.  Since last being seen in our clinic, the patient reports doing very well.  Her ICD has been found to be at [facility]. She reports that last year with Dr Wynonia Lawman, her EF had improved. Since that time, she has had some increased shortness of breath with exertion.  She denies chest pain, palpitations, PND, orthopnea, nausea, vomiting, dizziness, syncope, edema, weight gain, or early satiety.  She has not had ICD shocks.   Device History: MRT CRTD implanted 2013 for NICM, CHF History of appropriate therapy: No History of AAD therapy: No       Past Medical History:  Diagnosis Date  . AF (paroxysmal atrial fibrillation) ([place])   . Aortic valve disorder   . Arthritis   . CHF (congestive heart failure) ([place])   . Complication of anesthesia    aspiration  . Fibroids   . GERD (gastroesophageal reflux disease)    otc  . Hypertension   . Hyperthyroidism   . Hypothyroidism   . LBBB (left bundle branch block)    conduction disorder  . Migraines   . Osteoporosis   . Pleural effusion, right    thoracentesis 02/09/09   . Raynaud's disease   . Scoliosis   . 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 ARTERY BIOPSY  12/29/2011   Procedure: MINOR BIOPSY TEMPORAL ARTERY;  Surgeon: Edward Jolly, MD;  Location: [place];  Service: General;  Laterality: Right;  Right temporal artery biopsy  . BACK SURGERY  12/31/2009   "for flat back syndrome; fused bottom of my back"  . BI-VENTRICULAR IMPLANTABLE CARDIOVERTER DEFIBRILLATOR Left  01/20/2011   Procedure: BI-VENTRICULAR IMPLANTABLE CARDIOVERTER DEFIBRILLATOR  (CRT-D);  Surgeon: Evans Lance, MD;  Location: [facility] CATH LAB;  Service: Cardiovascular;  Laterality: Left;  . CHOLECYSTECTOMY  ~1996  . FOOT NEUROMA SURGERY  ~ 2003   right  . ROTATOR CUFF REPAIR  ~ 2009   left  . SHOULDER ARTHROSCOPY W/ ROTATOR CUFF REPAIR  09/21/2011  . SKIN CANCER EXCISION     nose, forehead, left leg  . THYROID LOBECTOMY  ~ 2006   right  . TUBAL LIGATION  ~ 1983  . VAGINAL HYSTERECTOMY  ~ 2009  . VESICOVAGINAL FISTULA CLOSURE W/ TAH            Current Outpatient Prescriptions  Medication Sig Dispense Refill  . acetaminophen (TYLENOL) 325 MG tablet Take 650 mg by mouth every 6 (six) hours as needed for mild pain.     Marland Kitchen AFLURIA QUADRIVALENT 0.5 ML injection TO BE ADMINISTERED BY PHARMACIST FOR IMMUNIZATION  0  . amitriptyline (ELAVIL) 25 MG tablet TAKE 1.5-2 TABLETS ONCE A NIGHT ORALLY 30 DAY(S)  5  . diclofenac sodium (VOLTAREN) 1 % GEL See admin instructions.  3  . eletriptan (RELPAX) 40 MG tablet Take 40 mg by mouth daily as needed. For migraine. may repeat in 2 hours if necessary    . fluticasone (FLONASE) 50 MCG/ACT nasal spray Place 1 spray into both nostrils daily as needed for allergies or rhinitis.    Marland Kitchen  furosemide (LASIX) 40 MG tablet Take 20 mg by mouth daily.    Marland Kitchen gabapentin (NEURONTIN) 600 MG tablet Take 1 tablet (600 mg total) by mouth 3 (three) times daily. 270 tablet 4  . levothyroxine (SYNTHROID, LEVOTHROID) 112 MCG tablet Take 112 mcg by mouth daily before breakfast.    . predniSONE (DELTASONE) 5 MG tablet Take 1 tablet (5 mg total) by mouth daily with breakfast. 21 tablet 0  . propranolol (INDERAL) 60 MG tablet Take 60 mg by mouth daily.    Marland Kitchen spironolactone (ALDACTONE) 25 MG tablet Take 12.5 mg by mouth daily.      No current facility-administered medications for this visit.     Allergies:   Codeine; Fentanyl; Morphine and  related; and Robaxin [methocarbamol]   Social History: Social History        Social History  . Marital status: Married    Spouse name: Ronalee Belts  . Number of children: 1  . Years of education: MA        Occupational History  . Teacher     n/a       Social History Main Topics  . Smoking status: Never Smoker  . Smokeless tobacco: Never Used  . Alcohol use No  . Drug use: No  . Sexual activity: Yes       Other Topics Concern  . Not on file      Social History Narrative   Patient lives at home with family.   Caffeine Use: 1-2 cups daily    Family History:      Family History  Problem Relation Age of Onset  . Heart disease Mother   . Heart failure Mother 51       CHF  . Stroke Mother   . Hypertension Mother   . Heart disease Father 51  . Macular degeneration Father   . Heart disease Sister   . Atrial fibrillation Sister   . Heart disease Sister     Review of Systems: All other systems reviewed and are otherwise negative except as noted above.   Physical Exam: VS:  BP 108/76   Pulse (!) 54   Ht 5' 7.5" (1.715 m)   Wt 161 lb (73 kg)   SpO2 98%   BMI 24.84 kg/m  , BMI Body mass index is 24.84 kg/m.  GEN- The patient is well appearing, alert and oriented x 3 today.   HEENT: normocephalic, atraumatic; sclera clear, conjunctiva pink; hearing intact; oropharynx clear; neck supple Lungs- Clear to ausculation bilaterally, normal work of breathing.  No wheezes, rales, rhonchi Heart- Regular rate and rhythm (paced) GI- soft, non-tender, non-distended, bowel sounds present Extremities- no clubbing, cyanosis, or edema MS- no significant deformity or atrophy Skin- warm and dry, no rash or lesion; ICD pocket well healed Psych- euthymic mood, full affect Neuro- strength and sensation are intact  ICD interrogation- reviewed in detail today,  See PACEART report  EKG:  EKG is ordered today. The ekg ordered today shows sinus rhythm with V  pacing  Recent Labs: No results found for requested labs within last 8760 hours.      Wt Readings from Last 3 Encounters:  11/02/16 161 lb (73 kg)  10/18/16 159 lb (72.1 kg)  06/20/16 161 lb (73 kg)     Other studies Reviewed: Additional studies/ records that were reviewed today include: Dr Tanna Furry office notes   Assessment and Plan:  1.  Chronic systolic dysfunction euvolemic today Stable on an appropriate medical regimen Last echo  a year ago with improved EF per patient. She has had some increased shortness of breath recently.  Will update echo prior to gen change.  She requests that echo be done at our office 2/2 pain in the past with tech at Dr Thurman Coyer office.  ICD at [facility]. Risks, benefits to gen change reviewed with patient who wishes to proceed.  Per Dr Tanna Furry last note, will plan on CRTP downgrade at time of gen change if improvement in EF. Discussed [employer] with patient today if she is downgraded to CRTP  2.  HTN Stable No change required today   Current medicines are reviewed at length with the patient today.   The patient does not have concerns regarding her medicines.  The following changes were made today:  none  Labs/ tests ordered today include: none    Orders Placed This Encounter  Procedures  . Basic metabolic panel  . CBC  . EKG 12-Lead  . ECHOCARDIOGRAM COMPLETE     Disposition:   Follow up with Dr Lovena Le after gen change      Signed, Chanetta Marshall, NP  EP Attending  Patient seen and examined. Agree with above. The patient has developed ERI on her device. She has never had an ICD shock and her EF has normalized with BiV pacing. I have discussed the options with the patient and have recommended she undergo removal of her current device and insertion of a BiV PPM. The indications/risk/benefits/goals/expectations of the procedure were reviewed with the patient and she wishes to proceed.  Mikle Bosworth.D.

## 2016-11-24 NOTE — Progress Notes (Signed)
Pt c/o head ache 5/10, pt is asking to take her own HA med relpax 40 mg, Dr Lovena Le said she could take her own med.

## 2016-11-25 ENCOUNTER — Telehealth: Payer: Self-pay | Admitting: Internal Medicine

## 2016-11-25 ENCOUNTER — Encounter (HOSPITAL_COMMUNITY): Payer: Self-pay | Admitting: Internal Medicine

## 2016-11-25 MED FILL — Fentanyl Citrate Preservative Free (PF) Inj 100 MCG/2ML: INTRAMUSCULAR | Qty: 2 | Status: AC

## 2016-11-25 NOTE — Telephone Encounter (Signed)
LVM for pt to call back~ direct number to DC given.

## 2016-11-25 NOTE — Telephone Encounter (Signed)
Spoke with pt informed her that she did not have any absolute driving restrictions d/t she had a generator change, informed her that if she felt like driving she could. Pt stated she did not have this amount of pain last time, informed pt that the device had been placed under the muscle this time and that it was not uncommon to have some more  pain associated. Informed pt that she could alternate Tylenol and Ibprofuen to help with pain and the pain should start to ease off some by the end of the weekend. Pt voiced understanding.

## 2016-11-25 NOTE — Telephone Encounter (Signed)
[name] is calling because she is still having a lot of pain in her arm and under her left arm pit from the placement of the pacemaker on yesterday, also want to know should she drive today or wait . Please call

## 2016-11-28 ENCOUNTER — Ambulatory Visit (INDEPENDENT_AMBULATORY_CARE_PROVIDER_SITE_OTHER): Payer: Self-pay | Admitting: *Deleted

## 2016-11-28 DIAGNOSIS — Z95810 Presence of automatic (implantable) cardiac defibrillator: Secondary | ICD-10-CM

## 2016-11-28 NOTE — Progress Notes (Signed)
Paired with [employer] Blu[employer] technology with [employer].

## 2016-12-05 ENCOUNTER — Ambulatory Visit (INDEPENDENT_AMBULATORY_CARE_PROVIDER_SITE_OTHER): Payer: Medicare Other | Admitting: *Deleted

## 2016-12-05 DIAGNOSIS — I428 Other cardiomyopathies: Secondary | ICD-10-CM | POA: Diagnosis not present

## 2016-12-05 NOTE — Progress Notes (Signed)
Wound check appointment. Steri-strips removed. Wound without redness or edema. Incision edges approximated, wound well healed.   Normal device function. Thresholds, sensing, and impedances consistent with implant measurements. Device programmed at chronic outputs s/p gen change. Histogram distribution appropriate for patient and level of activity. No mode switches or high ventricular rates noted. Patient educated about wound care, arm mobility, lifting restrictions.  Patient c/o of SOB with exertion and weakness for the past month, BP 91/56, GT increased 20 mg Lasix to twice a day for 3 days, ROV with GT 12/23/16.

## 2016-12-05 NOTE — Patient Instructions (Addendum)
INCREASE lasix 20 mg TWICE a day for 3 days

## 2016-12-06 ENCOUNTER — Telehealth: Payer: Self-pay | Admitting: Internal Medicine

## 2016-12-06 NOTE — Telephone Encounter (Signed)
[name] is calling to see when can she go back to doing her exercise [facility] [employer]) , she had a pacemaker placed two weeks ago . Please call .Marland Kitchen Thanks

## 2016-12-06 NOTE — Telephone Encounter (Signed)
Advised [name] that she does not have any activity restrictions d/t generator replacement only. My only concern would be the incision itself- she can return to water aerobics if the incision is closed without redness, swelling or drainage. She verbalizes understanding.

## 2016-12-15 ENCOUNTER — Ambulatory Visit (INDEPENDENT_AMBULATORY_CARE_PROVIDER_SITE_OTHER): Payer: Self-pay | Admitting: *Deleted

## 2016-12-15 DIAGNOSIS — I428 Other cardiomyopathies: Secondary | ICD-10-CM

## 2016-12-15 NOTE — Progress Notes (Signed)
Remote pacemaker transmission.   

## 2016-12-20 LAB — CUP PACEART REMOTE DEVICE CHECK
Battery Remaining Longevity: 112 mo
Battery Voltage: 3.17 V
Brady Statistic AP VP Percent: 0.07 %
Brady Statistic AP VS Percent: 0.02 %
Brady Statistic AS VP Percent: 98.28 %
Brady Statistic AS VS Percent: 1.63 %
Brady Statistic RA Percent Paced: 0.09 %
Brady Statistic RV Percent Paced: 0.54 %
Date Time Interrogation Session: 20181206032405
Implantable Lead Implant Date: 20130110
Implantable Lead Implant Date: 20130110
Implantable Lead Implant Date: 20130110
Implantable Lead Location: 753858
Implantable Lead Location: 753859
Implantable Lead Location: 753860
Implantable Lead Model: 4196
Implantable Lead Model: 5076
Implantable Lead Model: 6935
Implantable Pulse Generator Implant Date: 20181115
Lead Channel Impedance Value: 1235 Ohm
Lead Channel Impedance Value: 342 Ohm
Lead Channel Impedance Value: 456 Ohm
Lead Channel Impedance Value: 513 Ohm
Lead Channel Impedance Value: 551 Ohm
Lead Channel Impedance Value: 570 Ohm
Lead Channel Impedance Value: 665 Ohm
Lead Channel Impedance Value: 741 Ohm
Lead Channel Impedance Value: 855 Ohm
Lead Channel Pacing Threshold Amplitude: 0.625 V
Lead Channel Pacing Threshold Amplitude: 0.625 V
Lead Channel Pacing Threshold Amplitude: 1.625 V
Lead Channel Pacing Threshold Pulse Width: 0.4 ms
Lead Channel Pacing Threshold Pulse Width: 0.4 ms
Lead Channel Pacing Threshold Pulse Width: 0.8 ms
Lead Channel Sensing Intrinsic Amplitude: 1.5 mV
Lead Channel Sensing Intrinsic Amplitude: 1.5 mV
Lead Channel Sensing Intrinsic Amplitude: 18.625 mV
Lead Channel Sensing Intrinsic Amplitude: 18.625 mV
Lead Channel Setting Pacing Amplitude: 2 V
Lead Channel Setting Pacing Amplitude: 2.5 V
Lead Channel Setting Pacing Amplitude: 2.75 V
Lead Channel Setting Pacing Pulse Width: 0.4 ms
Lead Channel Setting Pacing Pulse Width: 0.8 ms
Lead Channel Setting Sensing Sensitivity: 0.9 mV

## 2016-12-22 ENCOUNTER — Encounter (INDEPENDENT_AMBULATORY_CARE_PROVIDER_SITE_OTHER): Payer: Self-pay | Admitting: Orthopaedic Surgery

## 2016-12-22 ENCOUNTER — Ambulatory Visit (INDEPENDENT_AMBULATORY_CARE_PROVIDER_SITE_OTHER): Payer: Medicare Other | Admitting: Orthopaedic Surgery

## 2016-12-22 VITALS — BP 107/66 | HR 58 | Ht 67.5 in | Wt 161.0 lb

## 2016-12-22 DIAGNOSIS — M542 Cervicalgia: Secondary | ICD-10-CM | POA: Diagnosis not present

## 2016-12-22 DIAGNOSIS — M47812 Spondylosis without myelopathy or radiculopathy, cervical region: Secondary | ICD-10-CM

## 2016-12-22 NOTE — Progress Notes (Signed)
Office Visit Note   Patient: [name]           Date of Birth: 01-15-1955           MRN: [id number] Visit Date: 12/22/2016              Requested by: Glenford Bayley, DO [place], [place] [id number] PCP: Glenford Bayley, DO   Assessment & Plan: Visit Diagnoses:  1. Neck pain   2. Spondylosis without myelopathy or radiculopathy, cervical region     Plan: We will proceed with a cervical CT scan for evaluation of her cervical spondylosis at the C5-6 C6-7 level she has progressed by plain radiographs since 2015.  Office follow-up after scan for review.  Follow-Up Instructions: No Follow-up on file.   Orders:  Orders Placed This Encounter  Procedures  . CT CERVICAL SPINE WO CONTRAST   No orders of the defined types were placed in this encounter.     Procedures: No procedures performed   Clinical Data: No additional findings.   Subjective: Chief Complaint  Patient presents with  . Neck - Pain, Follow-up    HPI 61 year old female returns for ongoing problems with cervical spondylosis states she did not keep her follow-up visit after her October visit because she had problems with her pacemaker and had to have a revision.  Pacemaker is doing well currently.   She has had Tylenol and ibuprofen without relief.  Water aerobic exercises sizes have not improved her symptoms.  No bowel or bladder symptoms no fever chills no lower extremity numbness or weakness.  She has used Voltaren gel on her neck.  Constant worse with rotation bothers her at night difficulty going to sleep and sometimes wakes her up.  It bothers her on a daily basis.  At Time she states the pain is severe.  Review of Systems 14 point review of systems updated unchanged from 10/18/2016 other than as mentioned in HPI.   Objective: Vital Signs: BP 107/66   Pulse (!) 58   Ht 5' 7.5" (1.715 m)   Wt 161 lb (73 kg)   BMI 24.84 kg/m   Physical Exam  Constitutional: She is oriented to person, place,  and time. She appears well-developed.  HENT:  Head: Normocephalic.  Right Ear: External ear normal.  Left Ear: External ear normal.  Eyes: Pupils are equal, round, and reactive to light.  Neck: No tracheal deviation present. No thyromegaly present.  Cardiovascular: Normal rate.  Pulmonary/Chest: Effort normal.  Abdominal: Soft.  Neurological: She is alert and oriented to person, place, and time.  Skin: Skin is warm and dry.  Psychiatric: She has a normal mood and affect. Her behavior is normal.    Ortho Exam negative for supraclavicular lymphadenopathy.  She has some brachial plexus tenderness on both right and left side.  Negative impingement of the shoulders.  Upper extremity and lower extremity reflexes plus and symmetrical.  The biceps triceps wrist flexion extension are strong.  Is able to heel and toe walk.  Specialty Comments:  No specialty comments available.  Imaging: No results found.   PMFS History: Patient Active Problem List   Diagnosis Date Noted  . Chronic systolic heart failure ([place]) 02/07/2014  . Trigeminal neuralgia 12/14/2012  . Insomnia, persistent 11/03/2011  . AICD (automatic cardioverter/defibrillator) present   . Aortic regurgitation   . Nonischemic cardiomyopathy ([place]) 01/21/2011  . Disc disease with myelopathy, lumbar 11/17/2010  . Congestive heart failure ([place])  11/17/2010  . 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 Esophagitis, reflux 08/24/2010  . Idiopathic peripheral neuropathy 08/24/2010  . Hypothyroidsim   . History of migraine headaches   . LBBB   . Scoliosis     Class: Chronic  . Lumbar canal stenosis 12/25/2009  . Anxiety state 07/24/2008  . Avitaminosis D 03/21/2008  . Atypical migraine 01/30/2008  . Benign essential HTN 01/30/2008  . Paroxysmal digital cyanosis 02/15/2007   Past Medical History:  Diagnosis Date  . AF (paroxysmal atrial fibrillation) ([place])   . Aortic valve disorder   . Arthritis   . CHF (congestive  heart failure) ([place])   . Complication of anesthesia    aspiration  . Fibroids   . GERD (gastroesophageal reflux disease)    otc  . Hypertension   . Hyperthyroidism   . Hypothyroidism   . LBBB (left bundle branch block)    conduction disorder  . Migraines   . Osteoporosis   . Pleural effusion, right    thoracentesis 02/09/09   . Raynaud's disease   . Scoliosis   . 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 Heart disease Mother   . Heart failure Mother 69       CHF  . Stroke Mother   . Hypertension Mother   . Heart disease Father 65  . Macular degeneration Father   . Heart disease Sister   . Atrial fibrillation Sister   . Heart disease Sister     Past Surgical History:  Procedure Laterality Date  . ARTERY BIOPSY  12/29/2011   Procedure: MINOR BIOPSY TEMPORAL ARTERY;  Surgeon: Edward Jolly, MD;  Location: [place];  Service: General;  Laterality: Right;  Right temporal artery biopsy  . BACK SURGERY  12/31/2009   "for flat back syndrome; fused bottom of my back"  . BI-VENTRICULAR IMPLANTABLE CARDIOVERTER DEFIBRILLATOR Left 01/20/2011   Procedure: BI-VENTRICULAR IMPLANTABLE CARDIOVERTER DEFIBRILLATOR  (CRT-D);  Surgeon: Evans Lance, MD;  Location: [facility] CATH LAB;  Service: Cardiovascular;  Laterality: Left;  . BIV ICD GENERATOR CHANGEOUT N/A 11/24/2016   Procedure: BIV ICD GENERATOR CHANGEOUT;  Surgeon: Evans Lance, MD;  Location: [place] CV LAB;  Service: Cardiovascular;  Laterality: N/A;  . CHOLECYSTECTOMY  ~1996  . FOOT NEUROMA SURGERY  ~ 2003   right  . ROTATOR CUFF REPAIR  ~ 2009   left  . SHOULDER ARTHROSCOPY W/ ROTATOR CUFF REPAIR  09/21/2011  . SKIN CANCER EXCISION     nose, forehead, left leg  . THYROID LOBECTOMY  ~ 2006   right  . TUBAL LIGATION  ~ 1983  . VAGINAL HYSTERECTOMY  ~ 2009  . VESICOVAGINAL FISTULA CLOSURE W/ TAH     Social History    Occupational History  . Occupation: Pharmacist, hospital    Comment: n/a  Tobacco Use  . Smoking status: Never Smoker  . Smokeless tobacco: Never Used  Substance and Sexual Activity  . Alcohol use: No  . Drug use: No  . Sexual activity: Yes

## 2016-12-23 ENCOUNTER — Encounter: Payer: Medicare Other | Admitting: Internal Medicine

## 2016-12-23 ENCOUNTER — Encounter: Payer: Self-pay | Admitting: Cardiology

## 2016-12-23 LAB — CUP PACEART INCLINIC DEVICE CHECK
Battery Remaining Longevity: 97 mo
Battery Voltage: 3.17 V
Brady Statistic AP VP Percent: 0.1 %
Brady Statistic AP VS Percent: 0.02 %
Brady Statistic AS VP Percent: 98.2 %
Brady Statistic AS VS Percent: 1.68 %
Brady Statistic RA Percent Paced: 0.13 %
Brady Statistic RV Percent Paced: 0.38 %
Date Time Interrogation Session: 20181126192237
Implantable Lead Implant Date: 20130110
Implantable Lead Implant Date: 20130110
Implantable Lead Implant Date: 20130110
Implantable Lead Location: 753858
Implantable Lead Location: 753859
Implantable Lead Location: 753860
Implantable Lead Model: 4196
Implantable Lead Model: 5076
Implantable Lead Model: 6935
Implantable Pulse Generator Implant Date: 20181115
Lead Channel Impedance Value: 1216 Ohm
Lead Channel Impedance Value: 323 Ohm
Lead Channel Impedance Value: 437 Ohm
Lead Channel Impedance Value: 589 Ohm
Lead Channel Impedance Value: 589 Ohm
Lead Channel Impedance Value: 627 Ohm
Lead Channel Impedance Value: 665 Ohm
Lead Channel Impedance Value: 722 Ohm
Lead Channel Impedance Value: 836 Ohm
Lead Channel Pacing Threshold Amplitude: 0.5 V
Lead Channel Pacing Threshold Amplitude: 0.75 V
Lead Channel Pacing Threshold Amplitude: 1.5 V
Lead Channel Pacing Threshold Pulse Width: 0.4 ms
Lead Channel Pacing Threshold Pulse Width: 0.4 ms
Lead Channel Pacing Threshold Pulse Width: 0.8 ms
Lead Channel Sensing Intrinsic Amplitude: 19.125 mV
Lead Channel Sensing Intrinsic Amplitude: 2.125 mV
Lead Channel Setting Pacing Amplitude: 2 V
Lead Channel Setting Pacing Amplitude: 2.5 V
Lead Channel Setting Pacing Amplitude: 3.5 V
Lead Channel Setting Pacing Pulse Width: 0.4 ms
Lead Channel Setting Pacing Pulse Width: 0.8 ms
Lead Channel Setting Sensing Sensitivity: 0.9 mV

## 2016-12-28 ENCOUNTER — Telehealth (INDEPENDENT_AMBULATORY_CARE_PROVIDER_SITE_OTHER): Payer: Self-pay | Admitting: *Deleted

## 2016-12-28 NOTE — Telephone Encounter (Signed)
Returned pts call left vm to return call

## 2016-12-28 NOTE — Telephone Encounter (Signed)
Pt called back is aware of appt scheduled and I asked if I could schedule her for follow up and she stated "NO I'm not going back to him" to please forward her results to her PCP.

## 2016-12-28 NOTE — Telephone Encounter (Signed)
Pt has appt scheduled for CT CSP on Dec 26 at 230pm at [address] Pt is to arrive 15 mins early to register. I called left message on vm to return my call for appt information and to schedule a follow up MRI results.

## 2016-12-29 ENCOUNTER — Ambulatory Visit (INDEPENDENT_AMBULATORY_CARE_PROVIDER_SITE_OTHER): Payer: Self-pay | Admitting: *Deleted

## 2016-12-29 DIAGNOSIS — I428 Other cardiomyopathies: Secondary | ICD-10-CM

## 2016-12-29 NOTE — Progress Notes (Signed)
Remote pacemaker transmission.   

## 2016-12-30 ENCOUNTER — Encounter: Payer: Self-pay | Admitting: Cardiology

## 2017-01-04 ENCOUNTER — Other Ambulatory Visit: Payer: Medicare Other

## 2017-01-06 ENCOUNTER — Ambulatory Visit
Admission: RE | Admit: 2017-01-06 | Discharge: 2017-01-06 | Disposition: A | Discharge status: Home / Self Care | Payer: Medicare Other | Source: Ambulatory Visit | Attending: Orthopaedic Surgery | Admitting: Orthopaedic Surgery

## 2017-01-06 DIAGNOSIS — M542 Cervicalgia: Secondary | ICD-10-CM

## 2017-01-08 ENCOUNTER — Encounter (INDEPENDENT_AMBULATORY_CARE_PROVIDER_SITE_OTHER): Payer: Self-pay | Admitting: Orthopaedic Surgery

## 2017-01-12 LAB — CUP PACEART REMOTE DEVICE CHECK
Battery Remaining Longevity: 125 mo
Battery Voltage: 3.17 V
Brady Statistic AP VP Percent: 0.37 %
Brady Statistic AP VS Percent: 0.02 %
Brady Statistic AS VP Percent: 97.9 %
Brady Statistic AS VS Percent: 1.71 %
Brady Statistic RA Percent Paced: 0.39 %
Brady Statistic RV Percent Paced: 0.45 %
Date Time Interrogation Session: 20181220032547
Implantable Lead Implant Date: 20130110
Implantable Lead Implant Date: 20130110
Implantable Lead Implant Date: 20130110
Implantable Lead Location: 753858
Implantable Lead Location: 753859
Implantable Lead Location: 753860
Implantable Lead Model: 4196
Implantable Lead Model: 5076
Implantable Lead Model: 6935
Implantable Pulse Generator Implant Date: 20181115
Lead Channel Impedance Value: 1216 Ohm
Lead Channel Impedance Value: 323 Ohm
Lead Channel Impedance Value: 418 Ohm
Lead Channel Impedance Value: 475 Ohm
Lead Channel Impedance Value: 513 Ohm
Lead Channel Impedance Value: 532 Ohm
Lead Channel Impedance Value: 627 Ohm
Lead Channel Impedance Value: 760 Ohm
Lead Channel Impedance Value: 874 Ohm
Lead Channel Pacing Threshold Amplitude: 0.5 V
Lead Channel Pacing Threshold Amplitude: 0.625 V
Lead Channel Pacing Threshold Amplitude: 1.625 V
Lead Channel Pacing Threshold Pulse Width: 0.4 ms
Lead Channel Pacing Threshold Pulse Width: 0.4 ms
Lead Channel Pacing Threshold Pulse Width: 0.8 ms
Lead Channel Sensing Intrinsic Amplitude: 1.25 mV
Lead Channel Sensing Intrinsic Amplitude: 1.25 mV
Lead Channel Sensing Intrinsic Amplitude: 17.375 mV
Lead Channel Sensing Intrinsic Amplitude: 17.375 mV
Lead Channel Setting Pacing Amplitude: 2 V
Lead Channel Setting Pacing Amplitude: 2.25 V
Lead Channel Setting Pacing Amplitude: 2.5 V
Lead Channel Setting Pacing Pulse Width: 0.4 ms
Lead Channel Setting Pacing Pulse Width: 0.8 ms
Lead Channel Setting Sensing Sensitivity: 0.9 mV

## 2017-02-01 ENCOUNTER — Ambulatory Visit (INDEPENDENT_AMBULATORY_CARE_PROVIDER_SITE_OTHER): Payer: Medicare Other | Admitting: Orthopedic Surgery

## 2017-02-23 ENCOUNTER — Encounter: Payer: Self-pay | Admitting: Internal Medicine

## 2017-02-25 ENCOUNTER — Emergency Department (HOSPITAL_BASED_OUTPATIENT_CLINIC_OR_DEPARTMENT_OTHER): Payer: Medicare Other

## 2017-02-25 ENCOUNTER — Inpatient Hospital Stay (HOSPITAL_BASED_OUTPATIENT_CLINIC_OR_DEPARTMENT_OTHER)
Admission: EM | Admit: 2017-02-25 | Discharge: 2017-03-01 | DRG: 815 | Disposition: A | Discharge status: Home / Self Care | Payer: Medicare Other | Attending: Internal Medicine | Admitting: Internal Medicine

## 2017-02-25 ENCOUNTER — Encounter (HOSPITAL_BASED_OUTPATIENT_CLINIC_OR_DEPARTMENT_OTHER): Payer: Self-pay | Admitting: Emergency Medicine

## 2017-02-25 ENCOUNTER — Other Ambulatory Visit: Payer: Self-pay

## 2017-02-25 DIAGNOSIS — Z888 Allergy status to other drugs, medicaments and biological substances status: Secondary | ICD-10-CM

## 2017-02-25 DIAGNOSIS — Z9049 Acquired absence of other specified parts of digestive tract: Secondary | ICD-10-CM | POA: Diagnosis not present

## 2017-02-25 DIAGNOSIS — Z8669 Personal history of other diseases of the nervous system and sense organs: Secondary | ICD-10-CM

## 2017-02-25 DIAGNOSIS — I5022 Chronic systolic (congestive) heart failure: Secondary | ICD-10-CM | POA: Diagnosis present

## 2017-02-25 DIAGNOSIS — Z91048 Other nonmedicinal substance allergy status: Secondary | ICD-10-CM | POA: Diagnosis not present

## 2017-02-25 DIAGNOSIS — G43909 Migraine, unspecified, not intractable, without status migrainosus: Secondary | ICD-10-CM | POA: Diagnosis present

## 2017-02-25 DIAGNOSIS — Z7951 Long term (current) use of inhaled steroids: Secondary | ICD-10-CM

## 2017-02-25 DIAGNOSIS — Z885 Allergy status to narcotic agent status: Secondary | ICD-10-CM

## 2017-02-25 DIAGNOSIS — I480 Paroxysmal atrial fibrillation: Secondary | ICD-10-CM | POA: Diagnosis present

## 2017-02-25 DIAGNOSIS — I110 Hypertensive heart disease with heart failure: Secondary | ICD-10-CM | POA: Diagnosis present

## 2017-02-25 DIAGNOSIS — I10 Essential (primary) hypertension: Secondary | ICD-10-CM | POA: Diagnosis not present

## 2017-02-25 DIAGNOSIS — E039 Hypothyroidism, unspecified: Secondary | ICD-10-CM | POA: Diagnosis present

## 2017-02-25 DIAGNOSIS — R1032 Left lower quadrant pain: Secondary | ICD-10-CM | POA: Diagnosis present

## 2017-02-25 DIAGNOSIS — I359 Nonrheumatic aortic valve disorder, unspecified: Secondary | ICD-10-CM | POA: Diagnosis present

## 2017-02-25 DIAGNOSIS — R1012 Left upper quadrant pain: Secondary | ICD-10-CM | POA: Diagnosis present

## 2017-02-25 DIAGNOSIS — Z79899 Other long term (current) drug therapy: Secondary | ICD-10-CM

## 2017-02-25 DIAGNOSIS — D735 Infarction of spleen: Principal | ICD-10-CM | POA: Diagnosis present

## 2017-02-25 DIAGNOSIS — Z8249 Family history of ischemic heart disease and other diseases of the circulatory system: Secondary | ICD-10-CM | POA: Diagnosis not present

## 2017-02-25 DIAGNOSIS — I351 Nonrheumatic aortic (valve) insufficiency: Secondary | ICD-10-CM | POA: Diagnosis not present

## 2017-02-25 DIAGNOSIS — Z85828 Personal history of other malignant neoplasm of skin: Secondary | ICD-10-CM | POA: Diagnosis not present

## 2017-02-25 DIAGNOSIS — K219 Gastro-esophageal reflux disease without esophagitis: Secondary | ICD-10-CM | POA: Diagnosis present

## 2017-02-25 DIAGNOSIS — I959 Hypotension, unspecified: Secondary | ICD-10-CM | POA: Diagnosis present

## 2017-02-25 DIAGNOSIS — I429 Cardiomyopathy, unspecified: Secondary | ICD-10-CM | POA: Diagnosis present

## 2017-02-25 DIAGNOSIS — Z79890 Hormone replacement therapy (postmenopausal): Secondary | ICD-10-CM | POA: Diagnosis not present

## 2017-02-25 DIAGNOSIS — I7300 Raynaud's syndrome without gangrene: Secondary | ICD-10-CM | POA: Diagnosis present

## 2017-02-25 DIAGNOSIS — M810 Age-related osteoporosis without current pathological fracture: Secondary | ICD-10-CM | POA: Diagnosis present

## 2017-02-25 DIAGNOSIS — Z95810 Presence of automatic (implantable) cardiac defibrillator: Secondary | ICD-10-CM | POA: Diagnosis present

## 2017-02-25 HISTORY — DX: Other cardiomyopathies: I42.8

## 2017-02-25 HISTORY — DX: Infarction of spleen: D73.5

## 2017-02-25 HISTORY — DX: Essential tremor: G25.0

## 2017-02-25 HISTORY — DX: Nonrheumatic aortic (valve) insufficiency: I35.1

## 2017-02-25 HISTORY — DX: Chronic systolic (congestive) heart failure: I50.22

## 2017-02-25 HISTORY — DX: Presence of cardiac pacemaker: Z95.0

## 2017-02-25 LAB — URINALYSIS, MICROSCOPIC (REFLEX)

## 2017-02-25 LAB — COMPREHENSIVE METABOLIC PANEL
ALT: 32 U/L (ref 14–54)
AST: 31 U/L (ref 15–41)
Albumin: 3.9 g/dL (ref 3.5–5.0)
Alkaline Phosphatase: 76 U/L (ref 38–126)
Anion gap: 9 (ref 5–15)
BUN: 19 mg/dL (ref 6–20)
CO2: 27 mmol/L (ref 22–32)
Calcium: 9 mg/dL (ref 8.9–10.3)
Chloride: 103 mmol/L (ref 101–111)
Creatinine, Ser: 1.01 mg/dL — ABNORMAL HIGH (ref 0.44–1.00)
GFR calc Af Amer: >60 mL/min (ref >60)
GFR calc non Af Amer: 58 mL/min — ABNORMAL LOW (ref >60)
Glucose, Bld: 103 mg/dL — ABNORMAL HIGH (ref 65–99)
Potassium: 3.8 mmol/L (ref 3.5–5.1)
Sodium: 139 mmol/L (ref 135–145)
Total Bilirubin: 0.6 mg/dL (ref 0.3–1.2)
Total Protein: 7.2 g/dL (ref 6.5–8.1)

## 2017-02-25 LAB — URINALYSIS, ROUTINE W REFLEX MICROSCOPIC
Bilirubin Urine: NEGATIVE
Glucose, UA: NEGATIVE mg/dL
Ketones, ur: NEGATIVE mg/dL
Nitrite: NEGATIVE
Protein, ur: NEGATIVE mg/dL
Specific Gravity, Urine: 1.01 (ref 1.005–1.030)
pH: 5.5 (ref 5.0–8.0)

## 2017-02-25 LAB — CBC
HCT: 44 % (ref 36.0–46.0)
Hemoglobin: 14.9 g/dL (ref 12.0–15.0)
MCH: 31 pg (ref 26.0–34.0)
MCHC: 33.9 g/dL (ref 30.0–36.0)
MCV: 91.7 fL (ref 78.0–100.0)
Platelets: 310 10*3/uL (ref 150–400)
RBC: 4.8 MIL/uL (ref 3.87–5.11)
RDW: 13.3 % (ref 11.5–15.5)
WBC: 11.6 10*3/uL — ABNORMAL HIGH (ref 4.0–10.5)

## 2017-02-25 LAB — I-STAT CG4 LACTIC ACID, ED: Lactic Acid, Venous: 0.86 mmol/L (ref 0.5–1.9)

## 2017-02-25 LAB — BRAIN NATRIURETIC PEPTIDE: B Natriuretic Peptide: 18.5 pg/mL (ref 0.0–100.0)

## 2017-02-25 LAB — TROPONIN I: Troponin I: <0.03 ng/mL (ref <0.03)

## 2017-02-25 LAB — LIPASE, BLOOD: Lipase: 31 U/L (ref 11–51)

## 2017-02-25 MED ORDER — ONDANSETRON HCL 4 MG/2ML IJ SOLN: 4.0000 mg | Freq: Four times a day (QID) | INTRAMUSCULAR | Status: DC | PRN

## 2017-02-25 MED ORDER — IOPAMIDOL (ISOVUE-300) INJECTION 61%
100.0000 mL | Freq: Once | INTRAVENOUS | Status: AC | PRN
  Administered 2017-02-25: 100 mL via INTRAVENOUS

## 2017-02-25 MED ORDER — MORPHINE SULFATE (PF) 4 MG/ML IV SOLN: 4.0000 mg | INTRAVENOUS | Status: DC | PRN

## 2017-02-25 MED ORDER — ACETAMINOPHEN 325 MG PO TABS
650.0000 mg | ORAL_TABLET | Freq: Once | ORAL | Status: AC
  Administered 2017-02-25: 650 mg via ORAL
  Filled 2017-02-25: qty 2

## 2017-02-25 MED ORDER — ACETAMINOPHEN 325 MG PO TABS
650.0000 mg | ORAL_TABLET | Freq: Four times a day (QID) | ORAL | Status: DC | PRN
  Administered 2017-02-26 – 2017-02-28 (×4): 650 mg via ORAL
  Filled 2017-02-25 (×4): qty 2

## 2017-02-25 MED ORDER — HYDROCODONE-ACETAMINOPHEN 5-325 MG PO TABS
1.0000 | ORAL_TABLET | ORAL | Status: DC | PRN
  Administered 2017-02-26 (×2): 0.5 via ORAL
  Filled 2017-02-25 (×3): qty 1

## 2017-02-25 NOTE — ED Notes (Signed)
[facility] arrived to transport pt at this time.  

## 2017-02-25 NOTE — ED Triage Notes (Addendum)
Pt sent from [facility]. Pt c/o abd pain radiating into her L chest since yesterday. States she feels like her pacemaker is clicking. Denies N/V/D

## 2017-02-25 NOTE — ED Notes (Signed)
Attempted to interrogate pt's pacemaker. [employer] servers are currently down and they are unable to receive transmission at this time. Local representative was contacted, but per EDP this is not urgent and interrogation can happen tomorrow.

## 2017-02-25 NOTE — Progress Notes (Signed)
Pt takes a muscle relaxer for neck pain, pt is not sure what the name of the med is, pt also has voltaren cream to help with arthritis pain, may need this while in the hospital, thanks Arvella Nigh RN.

## 2017-02-25 NOTE — ED Provider Notes (Signed)
[facility] DEPART[facility] Provider Note   CSN: [id number] Arrival date & time: 02/25/17  1550     History   Chief Complaint Chief Complaint  Patient presents with  . Abdominal Pain    HPI [name] is a 62 y.o. female.  Patient is a 62 year old female with a history of CHF, paroxysmal atrial fibrillation, aortic valve disorder, hypertension, pacemaker placement,hypothyroidism, status post cholecystectomy who is presenting today with 1 week of abdominal pain.  Patient describes it as a dull achy pain in the center of her abdomen that radiates around the abdomen.  Eating seems to make the pain worse.  She denies any nausea, vomiting and had a normal bowel movement today.  She denies any urinary symptoms such as frequency, dysuria or urgency.  Patient has not had fever.  She has however over the last week noticed slightly increased swelling in her ankles, shortness of breath with exertion and sometimes even talking.  She also says it feels like her pacemaker is clicking or bending.  It is an internal feeling she feels but nothing she can feel from the outside.  She states a slight burning pain.  She is still taking all her medications as prescribed and denies any recent changes in her medications.  Patient initially seen in urgent care and sent here for further workup.   The history is provided by the patient.    Past Medical History:  Diagnosis Date  . AF (paroxysmal atrial fibrillation) ([place])   . Aortic valve disorder   . Arthritis   . CHF (congestive heart failure) ([place])   . Complication of anesthesia    aspiration  . Fibroids   . GERD (gastroesophageal reflux disease)    otc  . Hypertension   . Hyperthyroidism   . Hypothyroidism   . LBBB (left bundle branch block)    conduction disorder  . Migraines   . Osteoporosis   . Pleural effusion, right    thoracentesis 02/09/09   . Raynaud's disease   . Scoliosis   . Skin cancer    "squamous cell on  nose, forehead, & left leg"    Patient Active Problem List   Diagnosis Date Noted  . Chronic systolic heart failure ([place]) 02/07/2014  . Trigeminal neuralgia 12/14/2012  . Insomnia, persistent 11/03/2011  . AICD (automatic cardioverter/defibrillator) present   . Aortic regurgitation   . Nonischemic cardiomyopathy ([place]) 01/21/2011  . Disc disease with myelopathy, lumbar 11/17/2010  . Congestive heart failure ([place]) 11/17/2010  . Constitutional aplastic anemia ([place]) 09/20/2010  . Esophagitis, reflux 08/24/2010  . Idiopathic peripheral neuropathy 08/24/2010  . Hypothyroidsim   . History of migraine headaches   . LBBB   . Scoliosis     Class: Chronic  . Lumbar canal stenosis 12/25/2009  . Anxiety state 07/24/2008  . Avitaminosis D 03/21/2008  . Atypical migraine 01/30/2008  . Benign essential HTN 01/30/2008  . Paroxysmal digital cyanosis 02/15/2007    Past Surgical History:  Procedure Laterality Date  . ARTERY BIOPSY  12/29/2011   Procedure: MINOR BIOPSY TEMPORAL ARTERY;  Surgeon: Edward Jolly, MD;  Location: [facility];  Service: General;  Laterality: Right;  Right temporal artery biopsy  . BACK SURGERY  12/31/2009   "for flat back syndrome; fused bottom of my back"  . BI-VENTRICULAR IMPLANTABLE CARDIOVERTER DEFIBRILLATOR Left 01/20/2011   Procedure: BI-VENTRICULAR IMPLANTABLE CARDIOVERTER DEFIBRILLATOR  (CRT-D);  Surgeon: Evans Lance, MD;  Location: [facility] CATH LAB;  Service: Cardiovascular;  Laterality: Left;  . BIV ICD GENERATOR CHANGEOUT N/A 11/24/2016   Procedure: BIV ICD GENERATOR CHANGEOUT;  Surgeon: Evans Lance, MD;  Location: [facility] CV LAB;  Service: Cardiovascular;  Laterality: N/A;  . CHOLECYSTECTOMY  ~1996  . FOOT NEUROMA SURGERY  ~ 2003   right  . ROTATOR CUFF REPAIR  ~ 2009   left  . SHOULDER ARTHROSCOPY W/ ROTATOR CUFF REPAIR  09/21/2011  . SKIN CANCER EXCISION     nose, forehead, left leg  . THYROID LOBECTOMY  ~ 2006   right  .  TUBAL LIGATION  ~ 1983  . VAGINAL HYSTERECTOMY  ~ 2009  . VESICOVAGINAL FISTULA CLOSURE W/ TAH      OB History    No data available       Home Medications    Prior to Admission medications   Medication Sig Start Date End Date Taking? Authorizing Provider  acetaminophen (TYLENOL) 500 MG tablet Take 1,000 mg 2 (two) times daily as needed by mouth for moderate pain.    [provider]  AFLURIA QUADRIVALENT 0.5 ML injection TO BE ADMINISTERED BY PHARMACIST FOR IMMUNIZATION 09/18/16   [provider]  amitriptyline (ELAVIL) 25 MG tablet Take 25m by mouth every night 09/10/16   [provider]  Carboxymethylcellul-Glycerin (LUBRICATING EYE DROPS OP) Place 1 drop daily into both eyes.    [provider]  Cyanocobalamin (B-12 COMPLIANCE INJECTION) 1000 MCG/ML KIT Inject 1,000 mcg every 30 (thirty) days as directed.    [provider]  diclofenac sodium (VOLTAREN) 1 % GEL Apply 2 g daily topically.  10/04/16   [provider]  diphenhydrAMINE (BENADRYL) 25 MG tablet Take 25 mg at bedtime by mouth.    [provider]  eletriptan (RELPAX) 40 MG tablet Take 40 mg by mouth daily as needed. For migraine. may repeat in 2 hours if necessary    [provider]  fluticasone (FLONASE) 50 MCG/ACT nasal spray Place 1 spray into both nostrils daily as needed for allergies or rhinitis.    [provider]  furosemide (LASIX) 40 MG tablet Take 20 mg by mouth daily.    [provider]  gabapentin (NEURONTIN) 600 MG tablet Take 1 tablet (600 mg total) by mouth 3 (three) times daily. Patient taking differently: Take 600 mg 2 (two) times daily by mouth.  12/14/12   Penumalli, VEarlean Polka MD  levothyroxine (SYNTHROID, LEVOTHROID) 100 MCG tablet Take 100 mcg daily before breakfast by mouth.    [provider]  Melatonin 5 MG TABS Take 5 mg at bedtime by mouth.    [provider]  omeprazole (PRILOSEC OTC) 20 MG tablet  Take 20 mg daily as needed by mouth (acid reflux).    [provider]  polyethylene glycol (MIRALAX / GLYCOLAX) packet Take 17 g every evening by mouth.    [provider]  predniSONE (DELTASONE) 5 MG tablet TAKE 1 TABLET BY MOUTH EVERY DAY WITH BREAKFAST Patient not taking: Reported on 11/17/2016 11/07/16   YMarybelle Killings MD  propranolol (INDERAL) 60 MG tablet Take 60 mg by mouth daily.    [provider]  spironolactone (ALDACTONE) 25 MG tablet Take 12.5 mg by mouth daily.     [provider]    Family History Family History  Problem Relation Age of Onset  . Heart disease Mother   . Heart failure Mother 828      CHF  . Stroke Mother   . Hypertension Mother   .  Heart disease Father 33  . Macular degeneration Father   . Heart disease Sister   . Atrial fibrillation Sister   . Heart disease Sister     Social History Social History   Tobacco Use  . Smoking status: Never Smoker  . Smokeless tobacco: Never Used  Substance Use Topics  . Alcohol use: No  . Drug use: No     Allergies   Codeine; Fentanyl; Morphine and related; and Robaxin [methocarbamol]   Review of Systems Review of Systems  All other systems reviewed and are negative.    Physical Exam Updated Vital Signs BP (!) 142/75 (BP Location: Left Arm)   Pulse 76   Temp 98.3 F (36.8 C) (Oral)   Resp 18   Ht '5\' 7"'  (1.702 m)   Wt 74.4 kg (164 lb)   SpO2 98%   BMI 25.69 kg/m   Physical Exam  Constitutional: She is oriented to person, place, and time. She appears well-developed and well-nourished. No distress.  HENT:  Head: Normocephalic and atraumatic.  Mouth/Throat: Oropharynx is clear and moist.  Eyes: Conjunctivae and EOM are normal. Pupils are equal, round, and reactive to light.  Neck: Normal range of motion. Neck supple.  Cardiovascular: Normal rate, regular rhythm and intact distal pulses.  No murmur heard. Pulmonary/Chest: Effort normal and breath sounds  normal. No respiratory distress. She has no wheezes. She has no rales.  Pacemaker pocket appears normal.  No reproducible chest wall tenderness.  Abdominal: Soft. She exhibits distension. There is tenderness in the left upper quadrant and left lower quadrant. There is guarding. There is no rebound. No hernia.  Musculoskeletal: Normal range of motion. She exhibits edema. She exhibits no tenderness.  Trace edema in lower extremities  Neurological: She is alert and oriented to person, place, and time.  Skin: Skin is warm and dry. No rash noted. No erythema.  Psychiatric: She has a normal mood and affect. Her behavior is normal.  Nursing note and vitals reviewed.    ED Treatments / Results  Labs (all labs ordered are listed, but only abnormal results are displayed) Labs Reviewed  URINALYSIS, ROUTINE W REFLEX MICROSCOPIC - Abnormal; Notable for the following components:      Result Value   Hgb urine dipstick MODERATE (*)    Leukocytes, UA TRACE (*)    All other components within normal limits  COMPREHENSIVE METABOLIC PANEL - Abnormal; Notable for the following components:   Glucose, Bld 103 (*)    Creatinine, Ser 1.01 (*)    GFR calc non Af Amer 58 (*)    All other components within normal limits  CBC - Abnormal; Notable for the following components:   WBC 11.6 (*)    All other components within normal limits  URINALYSIS, MICROSCOPIC (REFLEX) - Abnormal; Notable for the following components:   Bacteria, UA RARE (*)    Squamous Epithelial / LPF 0-5 (*)    All other components within normal limits  LIPASE, BLOOD  BRAIN NATRIURETIC PEPTIDE  TROPONIN I  I-STAT CG4 LACTIC ACID, ED    EKG  EKG Interpretation  Date/Time:  Saturday February 25 2017 15:58:49 EST Ventricular Rate:  73 PR Interval:  172 QRS Duration: 118 QT Interval:  424 QTC Calculation: 467 R Axis:   137 Text Interpretation:  Atrial-sensed ventricular-paced rhythm No significant change since last tracing Confirmed  by Blanchie Dessert ([id number]) on 02/25/2017 5:03:17 PM       Radiology Dg Chest 2 View  Result Date: 02/25/2017 CLINICAL  DATA:  Pain around pacemaker EXAM: CHEST  2 VIEW COMPARISON:  01/21/2011 FINDINGS: Lungs are hyperexpanded. The lungs are clear without focal pneumonia, edema, pneumothorax or pleural effusion. Battery pack for left pacer/AICD is exchanged in the interval. Thoracolumbar scoliosis with thoracolumbar fusion hardware again noted. Bones are diffusely demineralized. IMPRESSION: No active cardiopulmonary disease. Electronically Signed   By: Misty Stanley M.D.   On: 02/25/2017 17:38   Ct Abdomen Pelvis W Contrast  Result Date: 02/25/2017 CLINICAL DATA:  Left lower quadrant abdominal pain. Decreased appetite. EXAM: CT ABDOMEN AND PELVIS WITH CONTRAST TECHNIQUE: Multidetector CT imaging of the abdomen and pelvis was performed using the standard protocol following bolus administration of intravenous contrast. CONTRAST:  190m ISOVUE-300 IOPAMIDOL (ISOVUE-300) INJECTION 61% COMPARISON:  None. FINDINGS: Lower chest: Minimal ground-glass in the right base on series 4, image 5 could represent atelectasis or a subtle infectious process. No other changes in the lung bases. Hepatobiliary: Hepatic steatosis. Previous cholecystectomy. Patent portal vein. Pancreas: Unremarkable. No pancreatic ductal dilatation or surrounding inflammatory changes. Spleen: There is a wedge-shaped band of decreased attenuation in the spleen which is a new finding since 2017 consistent with a splenic infarct. The remainder of the spleen is normal. Adrenals/Urinary Tract: Tiny renal lesions are likely cysts but too small to characterize. No ureterectasis, ureteral stone, or bladder abnormality. Adrenal glands are normal. Stomach/Bowel: The stomach and small bowel are normal. Moderate fecal loading throughout much of the colon. The appendix is normal. Vascular/Lymphatic: Mild atherosclerotic change in the abdominal aorta which is  nonaneurysmal. No adenopathy. Reproductive: Status post hysterectomy. No adnexal masses. Other: No abdominal wall hernia or abnormality. No abdominopelvic ascites. Musculoskeletal: Postsurgical changes in the spine are stable. IMPRESSION: 1. 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 While age indeterminate, an acute infarct is not excluded. 2. Minimal ground-glass in the right lung base may represent atelectasis or a subtle infectious process. 3. Mild atherosclerotic change in the abdominal aorta. 4. Moderate fecal loading in the colon. Electronically Signed   By: DDorise BullionIII M.D   On: 02/25/2017 19:28    Procedures Procedures (including critical care time)  Medications Ordered in ED Medications - No data to display   Initial Impression / Assessment and Plan / ED Course  I have reviewed the triage vital signs and the nursing notes.  Pertinent labs & imaging results that were available during my care of the patient were reviewed by me and considered in my medical decision making (see chart for details).    Patient is a 62year old female with multiple medical problems presenting today with 1 week of abdominal pain in the center of her abdomen radiating around.  Also having some shortness of breath, minimal leg swelling and some discomfort around her pacemaker pocket.  She denies any chest pain.  She has had no fever, diarrhea or vomiting.  She is not displaying symptoms consistent with obstruction however concern for possible diverticulitis, pancreatitis or other acute abdominal colitis.  Low suspicion for hepatitis and patient is status post cholecystectomy.  Patient is also complaining of symptoms concerning for CHF exacerbation.  Will interrogate the pacemaker which EKG just shows a paced rhythm which is unchanged since November.  CBC, CMP, troponin, BNP, chest x-ray, CT abdomen and pelvis pending.  Patient currently refusing pain medication.  Vital signs are  reassuring.  8:02 PM Labs wnl except for mild leukocytosis of 11.  CXR wnl and no evidence of CHF with normal BNP and trop.  Having difficulty getting medtronic interrogator to work but will continue trying.  CT showed a new splenic infarct which is where pt has the pain.  Possibly patient is going intermittently into atrial fibrillation may be through a clot which may have caused the infarct.  Will be more helpful to have the pacemaker interrogation.  Currently patient is not in atrial fibrillation.  Patient is not anticoagulated.  Will call for admission and further workup.  Also concern for possible endocarditis.  Will get blood cultures.     Final Clinical Impressions(s) / ED Diagnoses   Final diagnoses:  Splenic infarct    ED Discharge Orders    None       Blanchie Dessert, MD 02/25/17 2024

## 2017-02-25 NOTE — ED Notes (Signed)
Patient transported to X-ray 

## 2017-02-25 NOTE — Significant Event (Signed)
Called by [facility] emergency room.  In brief 62 year old with past medical history relevant for cardiomyopathy status post CRT-D-D placement with resolution of cardiomyopathy, paroxysmal atrial fibrillation who came into the ER with dull abdominal pain and some lower extremity swelling and shortness of breath.  Found to have splenic infarct on CT imaging.  White count elevated to 11.6.  Will be transferred to telemetry bed for evaluation of intravascular source of thrombus.

## 2017-02-26 ENCOUNTER — Encounter (HOSPITAL_COMMUNITY): Payer: Self-pay | Admitting: Physician Assistant

## 2017-02-26 ENCOUNTER — Inpatient Hospital Stay (HOSPITAL_COMMUNITY): Payer: Medicare Other

## 2017-02-26 DIAGNOSIS — I10 Essential (primary) hypertension: Secondary | ICD-10-CM

## 2017-02-26 DIAGNOSIS — I480 Paroxysmal atrial fibrillation: Secondary | ICD-10-CM

## 2017-02-26 DIAGNOSIS — D735 Infarction of spleen: Principal | ICD-10-CM

## 2017-02-26 LAB — CBC
HCT: 43 % (ref 36.0–46.0)
Hemoglobin: 14.3 g/dL (ref 12.0–15.0)
MCH: 31.1 pg (ref 26.0–34.0)
MCHC: 33.3 g/dL (ref 30.0–36.0)
MCV: 93.5 fL (ref 78.0–100.0)
Platelets: 281 10*3/uL (ref 150–400)
RBC: 4.6 MIL/uL (ref 3.87–5.11)
RDW: 13.7 % (ref 11.5–15.5)
WBC: 8.9 10*3/uL (ref 4.0–10.5)

## 2017-02-26 LAB — BASIC METABOLIC PANEL
Anion gap: 10 (ref 5–15)
BUN: 15 mg/dL (ref 6–20)
CO2: 26 mmol/L (ref 22–32)
Calcium: 8.7 mg/dL — ABNORMAL LOW (ref 8.9–10.3)
Chloride: 104 mmol/L (ref 101–111)
Creatinine, Ser: 0.9 mg/dL (ref 0.44–1.00)
GFR calc Af Amer: >60 mL/min (ref >60)
GFR calc non Af Amer: >60 mL/min (ref >60)
Glucose, Bld: 109 mg/dL — ABNORMAL HIGH (ref 65–99)
Potassium: 3.8 mmol/L (ref 3.5–5.1)
Sodium: 140 mmol/L (ref 135–145)

## 2017-02-26 LAB — ECHOCARDIOGRAM COMPLETE
Height: 67 in
Weight: 2600 oz

## 2017-02-26 LAB — HIV ANTIBODY (ROUTINE TESTING W REFLEX): HIV Screen 4th Generation wRfx: NONREACTIVE

## 2017-02-26 MED ORDER — DICLOFENAC SODIUM 1 % TD GEL
2.0000 g | Freq: Every day | TRANSDERMAL | Status: DC
  Administered 2017-02-26 – 2017-03-01 (×4): 2 g via TOPICAL
  Filled 2017-02-26: qty 100

## 2017-02-26 MED ORDER — DIPHENHYDRAMINE HCL 25 MG PO CAPS
25.0000 mg | ORAL_CAPSULE | Freq: Every day | ORAL | Status: DC
  Administered 2017-02-26 – 2017-02-27 (×2): 25 mg via ORAL
  Filled 2017-02-26 (×3): qty 1

## 2017-02-26 MED ORDER — GABAPENTIN 600 MG PO TABS
600.0000 mg | ORAL_TABLET | Freq: Two times a day (BID) | ORAL | Status: DC
  Administered 2017-02-26 – 2017-03-01 (×8): 600 mg via ORAL
  Filled 2017-02-26 (×8): qty 1

## 2017-02-26 MED ORDER — PROPRANOLOL HCL 60 MG PO TABS
60.0000 mg | ORAL_TABLET | Freq: Every day | ORAL | Status: DC
  Administered 2017-02-26: 60 mg via ORAL
  Filled 2017-02-26: qty 1

## 2017-02-26 MED ORDER — SODIUM CHLORIDE 0.9% FLUSH: 3.0000 mL | INTRAVENOUS | Status: DC | PRN

## 2017-02-26 MED ORDER — POLYETHYLENE GLYCOL 3350 17 G PO PACK
17.0000 g | PACK | Freq: Every evening | ORAL | Status: DC
  Administered 2017-02-26 – 2017-02-27 (×2): 17 g via ORAL
  Filled 2017-02-26 (×4): qty 1

## 2017-02-26 MED ORDER — ELETRIPTAN HYDROBROMIDE 40 MG PO TABS
40.0000 mg | ORAL_TABLET | Freq: Every day | ORAL | Status: DC | PRN
  Administered 2017-02-26 – 2017-03-01 (×4): 40 mg via ORAL
  Filled 2017-02-26 (×8): qty 1

## 2017-02-26 MED ORDER — BISACODYL 5 MG PO TBEC: 5.0000 mg | DELAYED_RELEASE_TABLET | Freq: Every day | ORAL | Status: DC | PRN

## 2017-02-26 MED ORDER — MELATONIN 3 MG PO TABS
6.0000 mg | ORAL_TABLET | Freq: Every day | ORAL | Status: DC
  Administered 2017-02-26 – 2017-02-27 (×2): 6 mg via ORAL
  Filled 2017-02-26 (×5): qty 2

## 2017-02-26 MED ORDER — SODIUM CHLORIDE 0.9% FLUSH
3.0000 mL | Freq: Two times a day (BID) | INTRAVENOUS | Status: DC
  Administered 2017-02-28: 3 mL via INTRAVENOUS

## 2017-02-26 MED ORDER — LEVOTHYROXINE SODIUM 100 MCG PO TABS
100.0000 ug | ORAL_TABLET | Freq: Every day | ORAL | Status: DC
  Administered 2017-02-26 – 2017-03-01 (×4): 100 ug via ORAL
  Filled 2017-02-26 (×4): qty 1

## 2017-02-26 MED ORDER — PROPRANOLOL HCL 20 MG PO TABS
20.0000 mg | ORAL_TABLET | Freq: Every day | ORAL | Status: DC
  Administered 2017-02-27: 20 mg via ORAL
  Filled 2017-02-26: qty 1

## 2017-02-26 MED ORDER — ENOXAPARIN SODIUM 40 MG/0.4ML ~~LOC~~ SOLN
40.0000 mg | SUBCUTANEOUS | Status: DC
  Administered 2017-02-26 – 2017-03-01 (×4): 40 mg via SUBCUTANEOUS
  Filled 2017-02-26 (×4): qty 0.4

## 2017-02-26 MED ORDER — PANTOPRAZOLE SODIUM 20 MG PO TBEC: 40.0000 mg | DELAYED_RELEASE_TABLET | Freq: Every day | ORAL | Status: DC | PRN

## 2017-02-26 MED ORDER — AMITRIPTYLINE HCL 25 MG PO TABS
25.0000 mg | ORAL_TABLET | Freq: Every day | ORAL | Status: DC
Start: 2017-02-26 — End: 2017-03-01
  Administered 2017-02-26 – 2017-02-28 (×4): 25 mg via ORAL
  Filled 2017-02-26 (×5): qty 1

## 2017-02-26 MED ORDER — SODIUM CHLORIDE 0.9 % IV SOLN: 250.0000 mL | INTRAVENOUS | Status: DC | PRN

## 2017-02-26 MED ORDER — POLYVINYL ALCOHOL 1.4 % OP SOLN
1.0000 [drp] | Freq: Every day | OPHTHALMIC | Status: DC
  Administered 2017-02-26 – 2017-02-28 (×3): 1 [drp] via OPHTHALMIC
  Filled 2017-02-26: qty 15

## 2017-02-26 MED ORDER — SENNOSIDES-DOCUSATE SODIUM 8.6-50 MG PO TABS: 1.0000 | ORAL_TABLET | Freq: Every evening | ORAL | Status: DC | PRN

## 2017-02-26 MED ORDER — PERFLUTREN LIPID MICROSPHERE
1.0000 mL | INTRAVENOUS | Status: AC | PRN
  Administered 2017-02-26: 2 mL via INTRAVENOUS
  Administered 2017-02-26: 1 mL via INTRAVENOUS
  Filled 2017-02-26: qty 10

## 2017-02-26 MED ORDER — SODIUM CHLORIDE 0.9% FLUSH
3.0000 mL | Freq: Two times a day (BID) | INTRAVENOUS | Status: DC
  Administered 2017-02-26 – 2017-02-28 (×7): 3 mL via INTRAVENOUS

## 2017-02-26 NOTE — Progress Notes (Signed)
PROGRESS NOTE    [name]  UKG:[id number] DOB: [birth year] DOA: 02/25/2017 PCP: Glenford Bayley, DO    Brief Narrative: 62 y.o. female with medical history significant for paroxysmal atrial fibrillation not on anticoagulation, history of systolic CHF with AICD and normal EF on most recent echo, hypothyroidism, and hypertension, now presenting to the emergency department for evaluation of severe abdominal patient reports that she had been in her usual state until noting the onset of pain in the left upper abdomen and left flank approximately 1 week ago.  Since that time, symptoms have progressively worsened and the pain has become more generalized.  She denies fevers or chills, denies chest pain, denies palpitations, denies headache, change in vision or hearing, or focal numbness or weakness.  [facility] ED Course: Upon arrival to the ED, patient is found to be afebrile, saturating well on room air, blood pressure 103/57, and vitals otherwise normal.  EKG features a paced rhythm, chest x-rays negative for acute cardiopulmonary disease, CBC features a mild leukocytosis, chemistry panel is unremarkable, lactic acid is normal, troponin is undetectable, and urinalysis is unremarkable.  Blood cultures were collected in the emergency department and the patient was provided with analgesics.  She remains hemodynamically stable, no apparent respiratory distress, and she will be admitted to the telemetry unit for ongoing evaluation and management of splenic infarct  02/26/17-patient complains of pain in the left side of her abdomen 7 out of 10.  She is allergic to codeine fentanyl and morphine and she does not want to take Dilaudid or hydrocodone at this time.  She prefers to just keep taking Tylenol.  Denies any nausea vomiting diarrhea.  No fever or chills  Assessment & Plan:   Principal Problem:   Splenic infarction Active Problems:   Hypothyroidism   History of migraine headaches   AF  (paroxysmal atrial fibrillation) (HCC)   AICD (automatic cardioverter/defibrillator) present   Benign essential HTN   Splenic infarct  1. Splenic infarct  - Presents with severe abdominal pain, mainly LUQ and left flank - Found to have splenic infarct on CT  - She has hx of PAF without anticoagulation and thromboembolic etiology considered  - No other findings to suggest endocarditis   - Blood cultures collected in ED, echo ordered, continue cardiac monitoring, supportive care with analgesia   2. Paroxysmal atrial fibrillation  - In a sinus rhythm on admission  - CHADS-VASc may only be 2 (gender, HTN); there was a possible TIA historically as well  - Follows with cardiology, not anticoagulated  - Splenic infarct could be related, will continue cardiac monitoring, interrogate pacer.  Will consult cardiology patient's cardiologist is Dr. Wynonia Lawman.  3. Hx of CHF with AICD - Normal EF and no diastolic dysfunction on most recent echo from November '18  - Appears euvolemic  - Diuretics held on admission for BP on low side   4. Hypertension  - BP has been on low side, but stable  - Hold diuretics initially   -Decrease Inderal to 20 mg daily  5. Hypothyroidism   - Continue Synthroid       DVT prophylaxis: Lovenox Code Status: Full code Family Communication: No family available Disposition Plan: TBD  Consultants:  Cardiology Procedures: None Antimicrobials: None  Subjective: Complaints of pain in the left upper quadrant as well as funny feeling in her pacemaker site.  Objective: Vitals:   02/25/17 2000 02/25/17 2006 02/25/17 2240 02/26/17 0400  BP: (!) 107/58 119/61 (!) 103/57 Marland Kitchen)  98/52  Pulse: 73 74 80 72  Resp: 16 16 18 18   Temp:  97.7 F (36.5 C) 97.6 F (36.4 C) 97.7 F (36.5 C)  TempSrc:  Oral Oral Oral  SpO2: 100% 100% 100% 97%  Weight:   74 kg (163 lb 2.3 oz) 73.7 kg (162 lb 8 oz)  Height:   5\' 7"  (1.702 m)     Intake/Output Summary (Last 24 hours) at  02/26/2017 0958 Last data filed at 02/26/2017 0348 Gross per 24 hour  Intake -  Output 150 ml  Net -150 ml   Filed Weights   02/25/17 1556 02/25/17 2240 02/26/17 0400  Weight: 74.4 kg (164 lb) 74 kg (163 lb 2.3 oz) 73.7 kg (162 lb 8 oz)    Examination:  General exam: Appears calm and comfortable  Respiratory system: Clear to auscultation. Respiratory effort normal. Cardiovascular system: S1 & S2 heard, RRR. No JVD, murmurs, rubs, gallops or clicks. No pedal edema. Gastrointestinal system: Abdomen is nondistended, soft and nontender. No organomegaly or masses felt. Normal bowel sounds heard. Central nervous system: Alert and oriented. No focal neurological deficits. Extremities: Symmetric 5 x 5 power. Skin: No rashes, lesions or ulcers Psychiatry: Judgement and insight appear normal. Mood & affect appropriate.     Data Reviewed: I have personally reviewed following labs and imaging studies  CBC: Recent Labs  Lab 02/25/17 1736 02/26/17 0733  WBC 11.6* 8.9  HGB 14.9 14.3  HCT 44.0 43.0  MCV 91.7 93.5  PLT 310 096   Basic Metabolic Panel: Recent Labs  Lab 02/25/17 1736 02/26/17 0733  NA 139 140  K 3.8 3.8  CL 103 104  CO2 27 26  GLUCOSE 103* 109*  BUN 19 15  CREATININE 1.01* 0.90  CALCIUM 9.0 8.7*   GFR: Estimated Creatinine Clearance: 62.2 mL/min (by C-G formula based on SCr of 0.9 mg/dL). Liver Function Tests: Recent Labs  Lab 02/25/17 1736  AST 31  ALT 32  ALKPHOS 76  BILITOT 0.6  PROT 7.2  ALBUMIN 3.9   Recent Labs  Lab 02/25/17 1736  LIPASE 31   No results for input(s): AMMONIA in the last 168 hours. Coagulation Profile: No results for input(s): INR, PROTIME in the last 168 hours. Cardiac Enzymes: Recent Labs  Lab 02/25/17 1736  TROPONINI <0.03   BNP (last 3 results) No results for input(s): PROBNP in the last 8760 hours. HbA1C: No results for input(s): HGBA1C in the last 72 hours. CBG: No results for input(s): GLUCAP in the last 168  hours. Lipid Profile: No results for input(s): CHOL, HDL, LDLCALC, TRIG, CHOLHDL, LDLDIRECT in the last 72 hours. Thyroid Function Tests: No results for input(s): TSH, T4TOTAL, FREET4, T3FREE, THYROIDAB in the last 72 hours. Anemia Panel: No results for input(s): VITAMINB12, FOLATE, FERRITIN, TIBC, IRON, RETICCTPCT in the last 72 hours. Sepsis Labs: Recent Labs  Lab 02/25/17 1750  LATICACIDVEN 0.86    No results found for this or any previous visit (from the past 240 hour(s)).       Radiology Studies: Dg Chest 2 View  Result Date: 02/25/2017 CLINICAL DATA:  Pain around pacemaker EXAM: CHEST  2 VIEW COMPARISON:  01/21/2011 FINDINGS: Lungs are hyperexpanded. The lungs are clear without focal pneumonia, edema, pneumothorax or pleural effusion. Battery pack for left pacer/AICD is exchanged in the interval. Thoracolumbar scoliosis with thoracolumbar fusion hardware again noted. Bones are diffusely demineralized. IMPRESSION: No active cardiopulmonary disease. Electronically Signed   By: Misty Stanley M.D.   On: 02/25/2017 17:38  Ct Abdomen Pelvis W Contrast  Result Date: 02/25/2017 CLINICAL DATA:  Left lower quadrant abdominal pain. Decreased appetite. EXAM: CT ABDOMEN AND PELVIS WITH CONTRAST TECHNIQUE: Multidetector CT imaging of the abdomen and pelvis was performed using the standard protocol following bolus administration of intravenous contrast. CONTRAST:  128mL ISOVUE-300 IOPAMIDOL (ISOVUE-300) INJECTION 61% COMPARISON:  None. FINDINGS: Lower chest: Minimal ground-glass in the right base on series 4, image 5 could represent atelectasis or a subtle infectious process. No other changes in the lung bases. Hepatobiliary: Hepatic steatosis. Previous cholecystectomy. Patent portal vein. Pancreas: Unremarkable. No pancreatic ductal dilatation or surrounding inflammatory changes. Spleen: There is a wedge-shaped band of decreased attenuation in the spleen which is a new finding since 2017  consistent with a splenic infarct. The remainder of the spleen is normal. Adrenals/Urinary Tract: Tiny renal lesions are likely cysts but too small to characterize. No ureterectasis, ureteral stone, or bladder abnormality. Adrenal glands are normal. Stomach/Bowel: The stomach and small bowel are normal. Moderate fecal loading throughout much of the colon. The appendix is normal. Vascular/Lymphatic: Mild atherosclerotic change in the abdominal aorta which is nonaneurysmal. No adenopathy. Reproductive: Status post hysterectomy. No adnexal masses. Other: No abdominal wall hernia or abnormality. No abdominopelvic ascites. Musculoskeletal: Postsurgical changes in the spine are stable. IMPRESSION: 1. 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 While age indeterminate, an acute infarct is not excluded. 2. Minimal ground-glass in the right lung base may represent atelectasis or a subtle infectious process. 3. Mild atherosclerotic change in the abdominal aorta. 4. Moderate fecal loading in the colon. Electronically Signed   By: Dorise Bullion III M.D   On: 02/25/2017 19:28        Scheduled Meds: . amitriptyline  25 mg Oral QHS  . diclofenac sodium  2 g Topical Daily  . diphenhydrAMINE  25 mg Oral QHS  . enoxaparin (LOVENOX) injection  40 mg Subcutaneous Q24H  . gabapentin  600 mg Oral BID  . levothyroxine  100 mcg Oral QAC breakfast  . Melatonin  6 mg Oral QHS  . polyethylene glycol  17 g Oral QPM  . polyvinyl alcohol  1 drop Both Eyes Daily  . propranolol  60 mg Oral Daily  . sodium chloride flush  3 mL Intravenous Q12H  . sodium chloride flush  3 mL Intravenous Q12H   Continuous Infusions: . sodium chloride       LOS: 1 day      Georgette Shell, MD [facility] If 7PM-7AM, please contact night-coverage [email or web address] Password [facility] 02/26/2017, 9:58 AM

## 2017-02-26 NOTE — Progress Notes (Addendum)
*  PRELIMINARY RESULTS* Echocardiogram 2D Echocardiogram has been performed with Definity.  Samuel Germany 02/26/2017, 4:55 PM

## 2017-02-26 NOTE — Progress Notes (Signed)
Pt takes Baclofen 10mg  PRN, thanks Buckner [place].

## 2017-02-26 NOTE — Plan of Care (Signed)
  Progressing Education: Knowledge of General Education information will improve 02/26/2017 0240 - Progressing by Myriam Forehand, RN Health Behavior/Discharge Planning: Ability to manage health-related needs will improve 02/26/2017 0240 - Progressing by Myriam Forehand, RN Clinical Measurements: Ability to maintain clinical measurements within normal limits will improve 02/26/2017 0240 - Progressing by Myriam Forehand, RN Will remain free from infection 02/26/2017 0240 - Progressing by Myriam Forehand, RN Diagnostic test results will improve 02/26/2017 0240 - Progressing by Myriam Forehand, RN Respiratory complications will improve 02/26/2017 0240 - Progressing by Myriam Forehand, RN Cardiovascular complication will be avoided 02/26/2017 0240 - Progressing by Myriam Forehand, RN Nutrition: Adequate nutrition will be maintained 02/26/2017 0240 - Progressing by Myriam Forehand, RN Coping: Level of anxiety will decrease 02/26/2017 0240 - Progressing by Myriam Forehand, RN Elimination: Will not experience complications related to bowel motility 02/26/2017 0240 - Progressing by Myriam Forehand, RN Will not experience complications related to urinary retention 02/26/2017 0240 - Progressing by Myriam Forehand, RN Pain Managment: General experience of comfort will improve 02/26/2017 0240 - Progressing by Myriam Forehand, RN Safety: Ability to remain free from injury will improve 02/26/2017 0240 - Progressing by Myriam Forehand, RN Elimination: Will not experience complications related to bowel motility 02/26/2017 0240 - Progressing by Myriam Forehand, RN Pain Managment: General experience of comfort will improve 02/26/2017 0240 - Progressing by Myriam Forehand, RN

## 2017-02-26 NOTE — Consult Note (Signed)
Cardiology Consultation:   Patient ID: [name]; [id number]; [birth year]   Admit date: 02/25/2017 Date of Consult: 02/26/2017  Primary Care Provider: Glenford Bayley, DO Primary Cardiologist: Dr. Wynonia Lawman Primary Electrophysiologist:  Dr. Lovena Le  Chief Complaint: abdominal pain  Patient Profile:   [name] is a 62 y.o. 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 Rodena Piety.  History of Present Illness:   Her PMH lists paroxysmal atrial fibrillation, entered in 09/2011 by Dr. Wynonia Lawman. This has not been commented upon in her EP follow-up notes and not recognized on recent device interrogation notes - last OV 10/2016, last device interrogation 12/29/16. Do not see one in the system from January. She reports the atrial fib was seen around 2010, maybe in the setting of her hysterectomy. She's never been on anticoagulation.  For the past 6 months she reports feeling more SOB. She states if shopping at [facility] she feels fine but if walking for exercise or keeping up with fast-walking husband, she feels more SOB. This has been a gradual process. No CP. For the last several days she's also felt a vague abdominal pain described as a wave, along with some LUQ burning which she initially thought was related to her pacemaker. She presented to [facility] where imaging showed age indeterminate splenic infarct. This was not seen in August 2017 - otherwise minimal ground-glass in the right lung base may represent atelectasis or a subtle infectious process and mild atherosclerotic  change in abdominal aorta. BP soft this AM; spironolactone and Lasix not ordered; propranolol decreased. Labs show negative troponin, BNP, lactic acid; otherwise Cr 0.9, WBC 11.6->8.9, moderate Hgb in UA. Attempt was made at device interrogation in ER but apparently servers were down. She denies hx of TIA or CVA or ever being on a blood thinner.  Past Medical History:  Diagnosis Date  . AF (paroxysmal atrial fibrillation) ([place])   . Aortic valve disorder   . Arthritis   . Biventricular cardiac pacemaker in situ    a. prior CRT-D in 2013 with downgrade to CRT-Pacemaker only in 11/2016.  Marland Kitchen Chronic systolic CHF (congestive heart failure) ([place])   . Complication of anesthesia    aspiration  . Essential tremor   . Fibroids   . GERD (gastroesophageal reflux disease)    otc  . Hypertension   . Hyperthyroidism   . Hypothyroidism   . LBBB (left bundle branch block)    conduction disorder  . Migraines   . NICM (nonischemic cardiomyopathy) ([place])    a. 2012: normal coronaries, EF 25-30%. b. improved to 55% 11/2016.  . Osteoporosis   . Pleural effusion, right    thoracentesis 02/09/09   . Raynaud's disease   . Scoliosis   . 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 ARTERY BIOPSY  12/29/2011   Procedure: MINOR BIOPSY TEMPORAL ARTERY;  Surgeon: Edward Jolly, MD;  Location: [facility];  Service: General;  Laterality: Right;  Right temporal artery biopsy  . BACK SURGERY  12/31/2009   "for flat back syndrome; fused bottom of my back"  . BI-VENTRICULAR IMPLANTABLE CARDIOVERTER DEFIBRILLATOR Left 01/20/2011   Procedure: BI-VENTRICULAR IMPLANTABLE CARDIOVERTER DEFIBRILLATOR  (CRT-D);  Surgeon: Evans Lance, MD;  Location: [facility] CATH LAB;  Service: Cardiovascular;  Laterality: Left;  . BIV ICD GENERATOR CHANGEOUT N/A 11/24/2016   Procedure: BIV ICD GENERATOR CHANGEOUT;  Surgeon: Evans Lance, MD;   Location: [facility] CV LAB;  Service: Cardiovascular;  Laterality: N/A;  . CHOLECYSTECTOMY  ~1996  . FOOT NEUROMA SURGERY  ~ 2003   right  . ROTATOR CUFF REPAIR  ~ 2009   left  . SHOULDER ARTHROSCOPY W/ ROTATOR CUFF REPAIR  09/21/2011  . SKIN CANCER EXCISION     nose, forehead, left leg  . THYROID LOBECTOMY  ~ 2006   right  . TUBAL LIGATION  ~ 1983  . VAGINAL HYSTERECTOMY  ~ 2009  . VESICOVAGINAL FISTULA CLOSURE W/ TAH       Inpatient Medications: Scheduled Meds: . amitriptyline  25 mg Oral QHS  . diclofenac sodium  2 g Topical Daily  . diphenhydrAMINE  25 mg Oral QHS  . enoxaparin (LOVENOX) injection  40 mg Subcutaneous Q24H  . gabapentin  600 mg Oral BID  . levothyroxine  100 mcg Oral QAC breakfast  . Melatonin  6 mg Oral QHS  . polyethylene glycol  17 g Oral QPM  . polyvinyl alcohol  1 drop Both Eyes Daily  . [START ON 02/27/2017] propranolol  20 mg Oral Daily  . sodium chloride flush  3 mL Intravenous Q12H  . sodium chloride flush  3 mL Intravenous Q12H   Continuous Infusions: . sodium chloride     PRN Meds: sodium chloride, acetaminophen, bisacodyl, eletriptan, HYDROcodone-acetaminophen, ondansetron (ZOFRAN) IV, pantoprazole, senna-docusate, sodium chloride flush  Home Meds: Prior to Admission medications   Medication Sig Start Date End Date Taking? Authorizing Provider  acetaminophen (TYLENOL) 500 MG tablet Take 1,000 mg 2 (two) times daily as needed by mouth for moderate pain.    [provider]  AFLURIA QUADRIVALENT 0.5 ML injection TO BE ADMINISTERED BY PHARMACIST FOR IMMUNIZATION 09/18/16   [provider]  amitriptyline (ELAVIL) 25 MG tablet Take 58m by mouth every night 09/10/16   [provider]  Carboxymethylcellul-Glycerin (LUBRICATING EYE DROPS OP) Place 1 drop daily into both eyes.    [provider]  Cyanocobalamin (B-12 COMPLIANCE INJECTION) 1000 MCG/ML KIT Inject 1,000 mcg every 30 (thirty) days as directed.    [provider]  diclofenac sodium (VOLTAREN) 1 % GEL Apply 2 g daily topically.  10/04/16   [provider]  diphenhydrAMINE (BENADRYL) 25 MG tablet Take 25 mg at bedtime by mouth.    [provider]  eletriptan (RELPAX) 40 MG tablet Take 40 mg by mouth daily as needed. For migraine. may repeat in 2 hours if necessary    [provider]  fluticasone (FLONASE) 50 MCG/ACT nasal spray Place 1 spray into both nostrils daily as needed for allergies or rhinitis.    [provider]  furosemide (LASIX) 40 MG tablet Take 20 mg by mouth daily.    [provider]  gabapentin (NEURONTIN) 600 MG tablet Take 1 tablet (600 mg total) by mouth 3 (three) times daily. Patient taking differently: Take 600 mg 2 (two) times daily by mouth.  12/14/12   Penumalli, VEarlean Polka MD  levothyroxine (SYNTHROID, LEVOTHROID) 100 MCG tablet Take 100 mcg daily before breakfast by mouth.    [provider]  Melatonin 5 MG TABS Take 5 mg at bedtime by mouth.    [provider]  omeprazole (PRILOSEC OTC) 20 MG tablet Take 20 mg daily as needed by mouth (acid reflux).    [provider]  polyethylene glycol (MIRALAX / GLYCOLAX) packet Take 17 g every evening by mouth.    [provider]  propranolol (INDERAL) 60 MG tablet Take 60 mg by mouth daily.    [provider]  spironolactone (ALDACTONE) 25 MG tablet Take 12.5 mg by mouth daily.     [provider]    Allergies:    Allergies  Allergen Reactions  . Codeine Other (See Comments)    Chest pain  . Fentanyl Other (See Comments)    Bad dreams  . Morphine And Related Nausea And Vomiting  . Robaxin [Methocarbamol] Other (See Comments)    Chest pain.    Social History:   Social History   Socioeconomic History  . Marital status: Married    Spouse name: Ronalee Belts  . Number of children: 1  . Years of education: MA  . Highest education level: Not on file  Social Needs  . Financial  resource strain: Not on file  . Food insecurity - worry: Not on file  . Food insecurity - inability: Not on file  . Transportation needs - medical: Not on file  . Transportation needs - non-medical: Not on file  Occupational History  . Occupation: Pharmacist, hospital    Comment: n/a  Tobacco Use  . Smoking status: Never Smoker  . Smokeless tobacco: Never Used  Substance and Sexual Activity  . Alcohol use: No  . Drug use: No  . Sexual activity: Yes  Other Topics Concern  . Not on file  Social History Narrative   Patient lives at home with family.   Caffeine Use: 1-2 cups daily    Family History:   The patient's family history includes Atrial fibrillation in her sister; Heart disease in her mother, sister, and sister; Heart disease (age of onset: 70) in her father; Heart failure (age of onset: 8) in her mother; Hypertension in her mother; Macular degeneration in her father; Stroke in her mother.  ROS:  Please see the history of present illness.  All other ROS reviewed and negative.     Physical Exam/Data:   Vitals:   02/25/17 2000 02/25/17 2006 02/25/17 2240 02/26/17 0400  BP: (!) 107/58 119/61 (!) 103/57 (!) 98/52  Pulse: 73 74 80 72  Resp: '16 16 18 18  ' Temp:  97.7 F (36.5 C) 97.6 F (36.4 C) 97.7 F (36.5 C)  TempSrc:  Oral Oral Oral  SpO2: 100% 100% 100% 97%  Weight:   163 lb 2.3 oz (74 kg) 162 lb 8 oz (73.7 kg)  Height:   '5\' 7"'  (1.702 m)     Intake/Output Summary (Last 24 hours) at 02/26/2017 1130 Last data filed at 02/26/2017 0348 Gross per 24 hour  Intake -  Output 150 ml  Net -150 ml   Filed Weights   02/25/17 1556 02/25/17 2240 02/26/17 0400  Weight: 164 lb (74.4 kg) 163 lb 2.3 oz (74 kg) 162 lb 8 oz (73.7 kg)   Body mass index is 25.45 kg/m.  General: Well developed, well nourished, in no acute distress. Head: Normocephalic, atraumatic, sclera non-icteric, no xanthomas, nares are without discharge.  Neck: Negative for carotid bruits. JVD not elevated. Lungs:  Clear bilaterally to auscultation without wheezes, rales, or rhonchi. Breathing is unlabored. Heart: RRR  with S1 S2. No murmurs, rubs, or gallops appreciated. Abdomen: Soft, non-tender, non-distended with normoactive bowel sounds. No hepatomegaly. No rebound/guarding. No obvious abdominal masses. Msk:  Strength and tone appear normal for age. Extremities: No clubbing or cyanosis. No edema.  Distal pedal pulses are 2+ and equal bilaterally. Neuro: Alert and oriented X 3. No facial asymmetry. No focal deficit. Moves all extremities spontaneously. Psych:  Responds to questions appropriately with a normal affect.  EKG:  The EKG was personally reviewed and demonstrates atrial sensed biv paced rhythm 73bpm  Relevant CV Studies: 2D Echo 11/16/16 Study Conclusions  - Procedure narrative: Transthoracic echocardiography. Image   quality was adequate. Intravenous contrast (Definity) was   administered. - Left ventricle: The cavity size was normal. Wall thickness was   normal. The estimated ejection fraction was 55%. Left ventricular   diastolic function parameters were normal. - Aortic valve: There was mild regurgitation. - Atrial septum: No defect or patent foramen ovale was identified.  Laboratory Data:  Chemistry Recent Labs  Lab 02/25/17 1736 02/26/17 0733  NA 139 140  K 3.8 3.8  CL 103 104  CO2 27 26  GLUCOSE 103* 109*  BUN 19 15  CREATININE 1.01* 0.90  CALCIUM 9.0 8.7*  GFRNONAA 58* >60  GFRAA >60 >60  ANIONGAP 9 10    Recent Labs  Lab 02/25/17 1736  PROT 7.2  ALBUMIN 3.9  AST 31  ALT 32  ALKPHOS 76  BILITOT 0.6   Hematology Recent Labs  Lab 02/25/17 1736 02/26/17 0733  WBC 11.6* 8.9  RBC 4.80 4.60  HGB 14.9 14.3  HCT 44.0 43.0  MCV 91.7 93.5  MCH 31.0 31.1  MCHC 33.9 33.3  RDW 13.3 13.7  PLT 310 281   Cardiac Enzymes Recent Labs  Lab 02/25/17 1736  TROPONINI <0.03    BNP Recent Labs  Lab 02/25/17 1736  BNP 18.5    Radiology/Studies:  Dg  Chest 2 View  Result Date: 02/25/2017 CLINICAL DATA:  Pain around pacemaker EXAM: CHEST  2 VIEW COMPARISON:  01/21/2011 FINDINGS: Lungs are hyperexpanded. The lungs are clear without focal pneumonia, edema, pneumothorax or pleural effusion. Battery pack for left pacer/AICD is exchanged in the interval. Thoracolumbar scoliosis with thoracolumbar fusion hardware again noted. Bones are diffusely demineralized. IMPRESSION: No active cardiopulmonary disease. Electronically Signed   By: Misty Stanley M.D.   On: 02/25/2017 17:38   Ct Abdomen Pelvis W Contrast  Result Date: 02/25/2017 CLINICAL DATA:  Left lower quadrant abdominal pain. Decreased appetite. EXAM: CT ABDOMEN AND PELVIS WITH CONTRAST TECHNIQUE: Multidetector CT imaging of the abdomen and pelvis was performed using the standard protocol following bolus administration of intravenous contrast. CONTRAST:  150m ISOVUE-300 IOPAMIDOL (ISOVUE-300) INJECTION 61% COMPARISON:  None. FINDINGS: Lower chest: Minimal ground-glass in the right base on series 4, image 5 could represent atelectasis or a subtle infectious process. No other changes in the lung bases. Hepatobiliary: Hepatic steatosis. Previous cholecystectomy. Patent portal vein. Pancreas: Unremarkable. No pancreatic ductal dilatation or surrounding inflammatory changes. Spleen: There is a wedge-shaped band of decreased attenuation in the spleen which is a new finding since 2017 consistent with a splenic infarct. The remainder of the spleen is normal. Adrenals/Urinary Tract: Tiny renal lesions are likely cysts but too small to characterize. No ureterectasis, ureteral stone, or bladder abnormality. Adrenal glands are normal. Stomach/Bowel: The stomach and small bowel are normal. Moderate fecal loading throughout much of the colon. The appendix is normal. Vascular/Lymphatic: Mild atherosclerotic change in the abdominal aorta which  is nonaneurysmal. No adenopathy. Reproductive: Status post hysterectomy. No  adnexal masses. Other: No abdominal wall hernia or abnormality. No abdominopelvic ascites. Musculoskeletal: Postsurgical changes in the spine are stable. IMPRESSION: 1. 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 While age indeterminate, an acute infarct is not excluded. 2. Minimal ground-glass in the right lung base may represent atelectasis or a subtle infectious process. 3. Mild atherosclerotic change in the abdominal aorta. 4. Moderate fecal loading in the colon. Electronically Signed   By: Dorise Bullion III M.D   On: 02/25/2017 19:28    Assessment and Plan:   1. Splenic infarct 2. NICM/Chronic systolic CHF with recovery of EF 11/2016 3. ? Remote hx of PAF 4. Progressive SOB x 6 months 5. H/o mild AI by echo 6. HTN with hypotension this admission  She presents with abdominal pain with evidence of age-indeterminate splenic infarct on CT. We are asked to weigh in on anticoagulation. We first need to determine what her underlying rhythm has been since last device interrogation which was in December. I have contacted [employer] and asked that they review interrogation with Dr. Caryl Comes. Also need to await echo to determine if there are signs of any LV dysfunction which could potentially contribute to LV thrombus. Agree with blood cultures as well. If her workup does not show any cardiovascular reasons for clot, may need to consider w/u for malignancy or hypercoagulable state. Do not currently have an explanation for her dyspnea but further recommendations would be forthcoming depending on what her echo and interrogation show. Her BNP and troponin are normal and her coronaries were completely normal 6 years ago. Will review the above with Dr. Caryl Comes.  For questions or updates, please contact [facility] Please consult [email or web address] for contact info under Cardiology/STEMI.    Signed, Charlie Pitter, PA-C  02/26/2017 11:30 AM  Patient seen interviewed and examined  Splenic infarct    With prior LV dysfunction, there is a concern of recurrent left ventricular dysfunction and possible LV clot.  This will need to be excluded.  Echocardiogram may require contrast.  In addition, we will interrogate her device to look for evidence of atrial fibrillation.  TEE will be necessary if no other cause is identified both to exclude cardiac source of embolism well as to clarify the presence or absence of a PFO and the possibility of a paradoxical clot  In the event that all of this is unrevealing, evaluation for thrombophilic disorders is likely in order.  Defer this to Dr. Judith Part

## 2017-02-26 NOTE — Progress Notes (Signed)
Patient complaining of migraine headache and is requesting relpax.  Dr. Zigmund Daniel paged and received verbal order.

## 2017-02-26 NOTE — H&P (Signed)
History and Physical    [name] IBB:[id number] DOB: August 01, 1955 DOA: 02/25/2017  PCP: Glenford Bayley, DO   Patient coming from: Home, by way of [facility]   Chief Complaint: Severe abdominal pain  HPI: [name] is a 62 y.o. female with medical history significant for paroxysmal atrial fibrillation not on anticoagulation, history of systolic CHF with AICD and normal EF on most recent echo, hypothyroidism, and hypertension, now presenting to the emergency department for evaluation of severe abdominal patient reports that she had been in her usual state until noting the onset of pain in the left upper abdomen and left flank approximately 1 week ago.  Since that time, symptoms have progressively worsened and the pain has become more generalized.  She denies fevers or chills, denies chest pain, denies palpitations, denies headache, change in vision or hearing, or focal numbness or weakness.  [facility] ED Course: Upon arrival to the ED, patient is found to be afebrile, saturating well on room air, blood pressure 103/57, and vitals otherwise normal.  EKG features a paced rhythm, chest x-rays negative for acute cardiopulmonary disease, CBC features a mild leukocytosis, chemistry panel is unremarkable, lactic acid is normal, troponin is undetectable, and urinalysis is unremarkable.  Blood cultures were collected in the emergency department and the patient was provided with analgesics.  She remains hemodynamically stable, no apparent respiratory distress, and she will be admitted to the telemetry unit for ongoing evaluation and management of splenic infarct.  Review of Systems:  All other systems reviewed and apart from HPI, are negative.  Past Medical History:  Diagnosis Date  . AF (paroxysmal atrial fibrillation) ([place])   . Aortic valve disorder   . Arthritis   . CHF (congestive heart failure) ([place])   . Complication of anesthesia    aspiration  . Fibroids   . GERD  (gastroesophageal reflux disease)    otc  . Hypertension   . Hyperthyroidism   . Hypothyroidism   . LBBB (left bundle branch block)    conduction disorder  . Migraines   . Osteoporosis   . Pleural effusion, right    thoracentesis 02/09/09   . Raynaud's disease   . Scoliosis   . 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 ARTERY BIOPSY  12/29/2011   Procedure: MINOR BIOPSY TEMPORAL ARTERY;  Surgeon: Edward Jolly, MD;  Location: [place];  Service: General;  Laterality: Right;  Right temporal artery biopsy  . BACK SURGERY  12/31/2009   "for flat back syndrome; fused bottom of my back"  . BI-VENTRICULAR IMPLANTABLE CARDIOVERTER DEFIBRILLATOR Left 01/20/2011   Procedure: BI-VENTRICULAR IMPLANTABLE CARDIOVERTER DEFIBRILLATOR  (CRT-D);  Surgeon: Evans Lance, MD;  Location: [facility] CATH LAB;  Service: Cardiovascular;  Laterality: Left;  . BIV ICD GENERATOR CHANGEOUT N/A 11/24/2016   Procedure: BIV ICD GENERATOR CHANGEOUT;  Surgeon: Evans Lance, MD;  Location: [place] CV LAB;  Service: Cardiovascular;  Laterality: N/A;  . CHOLECYSTECTOMY  ~1996  . FOOT NEUROMA SURGERY  ~ 2003   right  . ROTATOR CUFF REPAIR  ~ 2009   left  . SHOULDER ARTHROSCOPY W/ ROTATOR CUFF REPAIR  09/21/2011  . SKIN CANCER EXCISION     nose, forehead, left leg  . THYROID LOBECTOMY  ~ 2006   right  . TUBAL LIGATION  ~ 1983  . VAGINAL HYSTERECTOMY  ~ 2009  .  VESICOVAGINAL FISTULA CLOSURE W/ TAH       reports that  has never smoked. she has never used smokeless tobacco. She reports that she does not drink alcohol or use drugs.  Allergies  Allergen Reactions  . Codeine Other (See Comments)    Chest pain  . Fentanyl Other (See Comments)    Bad dreams  . Morphine And Related Nausea And Vomiting  . Robaxin [Methocarbamol] Other (See Comments)    Chest pain.    Family History  Problem Relation Age of Onset    . Heart disease Mother   . Heart failure Mother 53       CHF  . Stroke Mother   . Hypertension Mother   . Heart disease Father 3  . Macular degeneration Father   . Heart disease Sister   . Atrial fibrillation Sister   . Heart disease Sister      Prior to Admission medications   Medication Sig Start Date End Date Taking? Authorizing Provider  acetaminophen (TYLENOL) 500 MG tablet Take 1,000 mg 2 (two) times daily as needed by mouth for moderate pain.    [provider]  AFLURIA QUADRIVALENT 0.5 ML injection TO BE ADMINISTERED BY PHARMACIST FOR IMMUNIZATION 09/18/16   [provider]  amitriptyline (ELAVIL) 25 MG tablet Take 86m by mouth every night 09/10/16   [provider]  Carboxymethylcellul-Glycerin (LUBRICATING EYE DROPS OP) Place 1 drop daily into both eyes.    [provider]  Cyanocobalamin (B-12 COMPLIANCE INJECTION) 1000 MCG/ML KIT Inject 1,000 mcg every 30 (thirty) days as directed.    [provider]  diclofenac sodium (VOLTAREN) 1 % GEL Apply 2 g daily topically.  10/04/16   [provider]  diphenhydrAMINE (BENADRYL) 25 MG tablet Take 25 mg at bedtime by mouth.    [provider]  eletriptan (RELPAX) 40 MG tablet Take 40 mg by mouth daily as needed. For migraine. may repeat in 2 hours if necessary    [provider]  fluticasone (FLONASE) 50 MCG/ACT nasal spray Place 1 spray into both nostrils daily as needed for allergies or rhinitis.    [provider]  furosemide (LASIX) 40 MG tablet Take 20 mg by mouth daily.    [provider]  gabapentin (NEURONTIN) 600 MG tablet Take 1 tablet (600 mg total) by mouth 3 (three) times daily. Patient taking differently: Take 600 mg 2 (two) times daily by mouth.  12/14/12   Penumalli, VEarlean Polka MD  levothyroxine (SYNTHROID, LEVOTHROID) 100 MCG tablet Take 100 mcg daily before breakfast by mouth.    [provider]  Melatonin 5 MG TABS Take 5 mg  at bedtime by mouth.    [provider]  omeprazole (PRILOSEC OTC) 20 MG tablet Take 20 mg daily as needed by mouth (acid reflux).    [provider]  polyethylene glycol (MIRALAX / GLYCOLAX) packet Take 17 g every evening by mouth.    [provider]  propranolol (INDERAL) 60 MG tablet Take 60 mg by mouth daily.    [provider]  spironolactone (ALDACTONE) 25 MG tablet Take 12.5 mg by mouth daily.     [provider]    Physical Exam: Vitals:   02/25/17 1804 02/25/17 2000 02/25/17 2006 02/25/17 2240  BP: 121/73 (!) 107/58 119/61 (!) 103/57  Pulse: 74 73 74 80  Resp: _0 Temp:   97.7 F (36.5 C) 97.6 F (36.4 C)  TempSrc:   Oral Oral  SpO2: 100% 100% 100% 100%  Weight:    74 kg (163 lb 2.3 oz)  Height:    5' 7" (1.702 m)      Constitutional: NAD, calm, in apparent discomfort Eyes: PERTLA, lids and conjunctivae normal ENMT: Mucous membranes are moist. Posterior pharynx clear of any exudate or lesions.   Neck: normal, supple, no masses, no thyromegaly Respiratory: clear to auscultation bilaterally, no wheezing, no crackles. Normal respiratory effort.   Cardiovascular: S1 & S2 heard, regular rate and rhythm. No significant JVD. Abdomen: No distension, soft, tender at left flank. Bowel sounds normal.  Musculoskeletal: no clubbing / cyanosis. No joint deformity upper and lower extremities.    Skin: no significant rashes, lesions, ulcers. Warm, dry, well-perfused. Neurologic: CN 2-12 grossly intact. Sensation intact. Strength 5/5 in all 4 limbs.  Psychiatric:  Alert and oriented x 3. Calm, cooperative.     Labs on Admission: I have personally reviewed following labs and imaging studies  CBC: Recent Labs  Lab 02/25/17 1736  WBC 11.6*  HGB 14.9  HCT 44.0  MCV 91.7  PLT 244   Basic Metabolic Panel: Recent Labs  Lab 02/25/17 1736  NA 139  K 3.8  CL 103  CO2 27  GLUCOSE 103*  BUN 19  CREATININE 1.01*  CALCIUM  9.0   GFR: Estimated Creatinine Clearance: 59.9 mL/min (A) (by C-G formula based on SCr of 1.01 mg/dL (H)). Liver Function Tests: Recent Labs  Lab 02/25/17 1736  AST 31  ALT 32  ALKPHOS 76  BILITOT 0.6  PROT 7.2  ALBUMIN 3.9   Recent Labs  Lab 02/25/17 1736  LIPASE 31   No results for input(s): AMMONIA in the last 168 hours. Coagulation Profile: No results for input(s): INR, PROTIME in the last 168 hours. Cardiac Enzymes: Recent Labs  Lab 02/25/17 1736  TROPONINI <0.03   BNP (last 3 results) No results for input(s): PROBNP in the last 8760 hours. HbA1C: No results for input(s): HGBA1C in the last 72 hours. CBG: No results for input(s): GLUCAP in the last 168 hours. Lipid Profile: No results for input(s): CHOL, HDL, LDLCALC, TRIG, CHOLHDL, LDLDIRECT in the last 72 hours. Thyroid Function Tests: No results for input(s): TSH, T4TOTAL, FREET4, T3FREE, THYROIDAB in the last 72 hours. Anemia Panel: No results for input(s): VITAMINB12, FOLATE, FERRITIN, TIBC, IRON, RETICCTPCT in the last 72 hours. Urine analysis:    Component Value Date/Time   COLORURINE YELLOW 02/25/2017 1736   APPEARANCEUR CLEAR 02/25/2017 1736   LABSPEC 1.010 02/25/2017 1736   PHURINE 5.5 02/25/2017 1736   GLUCOSEU NEGATIVE 02/25/2017 1736   HGBUR MODERATE (A) 02/25/2017 1736   BILIRUBINUR NEGATIVE 02/25/2017 1736   KETONESUR NEGATIVE 02/25/2017 1736   PROTEINUR NEGATIVE 02/25/2017 1736   UROBILINOGEN 0.2 03/26/2009 0927   NITRITE NEGATIVE 02/25/2017 1736   LEUKOCYTESUR TRACE (A) 02/25/2017 1736   Sepsis Labs: _0 (procalcitonin:4,lacticidven:4) )No results found for this or any previous visit (from the past 240 hour(s)).   Radiological Exams on Admission: Dg Chest 2 View  Result Date: 02/25/2017 CLINICAL DATA:  Pain around pacemaker EXAM: CHEST  2 VIEW COMPARISON:  01/21/2011 FINDINGS: Lungs are hyperexpanded. The lungs are clear without focal pneumonia, edema, pneumothorax or  pleural effusion. Battery pack for left pacer/AICD is exchanged in the interval. Thoracolumbar scoliosis with thoracolumbar fusion hardware again noted. Bones are diffusely demineralized. IMPRESSION: No active cardiopulmonary disease. Electronically Signed   By: Misty Stanley M.D.   On: 02/25/2017 17:38   Ct Abdomen Pelvis W Contrast  Result Date: 02/25/2017 CLINICAL DATA:  Left lower quadrant abdominal pain. Decreased appetite. EXAM: CT ABDOMEN AND PELVIS WITH CONTRAST TECHNIQUE: Multidetector CT imaging of the abdomen and pelvis was performed using the standard protocol following bolus administration of intravenous contrast. CONTRAST:  149m ISOVUE-300 IOPAMIDOL (ISOVUE-300) INJECTION 61% COMPARISON:  None. FINDINGS: Lower chest: Minimal ground-glass in the right base on series 4, image 5 could represent atelectasis or a subtle infectious process. No other changes in the lung bases. Hepatobiliary: Hepatic steatosis. Previous cholecystectomy. Patent portal vein. Pancreas: Unremarkable. No pancreatic ductal dilatation or surrounding inflammatory changes. Spleen: There is a wedge-shaped band of decreased attenuation in the spleen which is a new finding since 2017 consistent with a splenic infarct. The remainder of the spleen is normal. Adrenals/Urinary Tract: Tiny renal lesions are likely cysts but too small to characterize. No ureterectasis, ureteral stone, or bladder abnormality. Adrenal glands are normal. Stomach/Bowel: The stomach and small bowel are normal. Moderate fecal loading throughout much of the colon. The appendix is normal. Vascular/Lymphatic: Mild atherosclerotic change in the abdominal aorta which is nonaneurysmal. No adenopathy. Reproductive: Status post hysterectomy. No adnexal masses. Other: No abdominal wall hernia or abnormality. No abdominopelvic ascites. Musculoskeletal: Postsurgical changes in the spine are stable. IMPRESSION: 1. There is a splenic infarct which was not seen in August  2017. While age indeterminate, an acute infarct is not excluded. 2. Minimal ground-glass in the right lung base may represent atelectasis or a subtle infectious process. 3. Mild atherosclerotic change in the abdominal aorta. 4. Moderate fecal loading in the colon. Electronically Signed   By: DDorise BullionIII M.D   On: 02/25/2017 19:28    EKG: Independently reviewed. Paced rhythm, sinus.   Assessment/Plan  1. Splenic infarct  - Presents with severe abdominal pain, mainly LUQ and left flank - Found to have splenic infarct on CT  - She has hx of PAF without anticoagulation and thromboembolic etiology considered  - No other findings to suggest endocarditis   - Blood cultures collected in ED, echo ordered, continue cardiac monitoring, supportive care with analgesia   2. Paroxysmal atrial fibrillation  - In a sinus rhythm on admission  - CHADS-VASc may only be 2 (gender, HTN); there was a possible TIA historically as well  - Follows with cardiology, not anticoagulated  - Splenic infarct could be related, will continue cardiac monitoring, interrogate pacer, check echo   3. Hx of CHF with AICD - Normal EF and no diastolic dysfunction on most recent echo from November '18  - Appears euvolemic  - Diuretics held on admission for BP on low side   4. Hypertension  - BP has been on low side, but stable  - Hold diuretics initially    5. Hypothyroidism   - Continue Synthroid     DVT prophylaxis: Lovenox Code Status: Full  Family Communication: Discussed with patient Disposition Plan: Admit to telemetry Consults called: None Admission status: Inpatient    TVianne Bulls MD [facility] Pager [phone] If 7PM-7AM, please contact night-coverage [email or web address] Password [email or web address] 02/26/2017, 12:45 AM

## 2017-02-27 ENCOUNTER — Ambulatory Visit (HOSPITAL_COMMUNITY)
Admission: EM | Admit: 2017-02-27 | Discharge: 2017-02-27 | Discharge status: Absent without leave | Payer: Medicare Other

## 2017-02-27 DIAGNOSIS — I480 Paroxysmal atrial fibrillation: Secondary | ICD-10-CM

## 2017-02-27 LAB — COMPREHENSIVE METABOLIC PANEL
ALT: 30 U/L (ref 14–54)
AST: 30 U/L (ref 15–41)
Albumin: 3.2 g/dL — ABNORMAL LOW (ref 3.5–5.0)
Alkaline Phosphatase: 71 U/L (ref 38–126)
Anion gap: 12 (ref 5–15)
BUN: 17 mg/dL (ref 6–20)
CO2: 26 mmol/L (ref 22–32)
Calcium: 9.1 mg/dL (ref 8.9–10.3)
Chloride: 102 mmol/L (ref 101–111)
Creatinine, Ser: 1 mg/dL (ref 0.44–1.00)
GFR calc Af Amer: >60 mL/min (ref >60)
GFR calc non Af Amer: 59 mL/min — ABNORMAL LOW (ref >60)
Glucose, Bld: 102 mg/dL — ABNORMAL HIGH (ref 65–99)
Potassium: 3.7 mmol/L (ref 3.5–5.1)
Sodium: 140 mmol/L (ref 135–145)
Total Bilirubin: 0.5 mg/dL (ref 0.3–1.2)
Total Protein: 6.1 g/dL — ABNORMAL LOW (ref 6.5–8.1)

## 2017-02-27 LAB — CBC WITH DIFFERENTIAL/PLATELET
Basophils Absolute: 0 10*3/uL (ref 0.0–0.1)
Basophils Relative: 0 %
Eosinophils Absolute: 0.2 10*3/uL (ref 0.0–0.7)
Eosinophils Relative: 2 %
HCT: 41.4 % (ref 36.0–46.0)
Hemoglobin: 13.5 g/dL (ref 12.0–15.0)
Lymphocytes Relative: 22 %
Lymphs Abs: 2.6 10*3/uL (ref 0.7–4.0)
MCH: 31 pg (ref 26.0–34.0)
MCHC: 32.6 g/dL (ref 30.0–36.0)
MCV: 95.2 fL (ref 78.0–100.0)
Monocytes Absolute: 1.2 10*3/uL — ABNORMAL HIGH (ref 0.1–1.0)
Monocytes Relative: 10 %
Neutro Abs: 7.9 10*3/uL — ABNORMAL HIGH (ref 1.7–7.7)
Neutrophils Relative %: 66 %
Platelets: 306 10*3/uL (ref 150–400)
RBC: 4.35 MIL/uL (ref 3.87–5.11)
RDW: 13.8 % (ref 11.5–15.5)
WBC: 11.9 10*3/uL — ABNORMAL HIGH (ref 4.0–10.5)

## 2017-02-27 LAB — ANTITHROMBIN III: AntiThromb III Func: 111 % (ref 75–120)

## 2017-02-27 MED ORDER — PROPRANOLOL HCL 10 MG PO TABS
10.0000 mg | ORAL_TABLET | Freq: Every day | ORAL | Status: DC
  Administered 2017-03-01: 10 mg via ORAL
  Filled 2017-02-27 (×2): qty 1

## 2017-02-27 NOTE — Progress Notes (Signed)
Progress Note  Patient Name: [name] Date of Encounter: 02/27/2017  Primary Cardiologist: Cristopher Peru, MD   Subjective   States she has minimal abdominal pain with current pain medication.  Denies shortness of breath or chest pain.    Inpatient Medications    Scheduled Meds: . amitriptyline  25 mg Oral QHS  . diclofenac sodium  2 g Topical Daily  . diphenhydrAMINE  25 mg Oral QHS  . enoxaparin (LOVENOX) injection  40 mg Subcutaneous Q24H  . gabapentin  600 mg Oral BID  . levothyroxine  100 mcg Oral QAC breakfast  . Melatonin  6 mg Oral QHS  . polyethylene glycol  17 g Oral QPM  . polyvinyl alcohol  1 drop Both Eyes Daily  . propranolol  20 mg Oral Daily  . sodium chloride flush  3 mL Intravenous Q12H  . sodium chloride flush  3 mL Intravenous Q12H   Continuous Infusions: . sodium chloride     PRN Meds: sodium chloride, acetaminophen, bisacodyl, eletriptan, HYDROcodone-acetaminophen, ondansetron (ZOFRAN) IV, pantoprazole, senna-docusate, sodium chloride flush   Vital Signs    Vitals:   02/26/17 1551 02/26/17 2033 02/27/17 0513 02/27/17 0854  BP: (!) 119/58 107/62 (!) 116/50 97/69  Pulse: 75 74 83 76  Resp: 20 18 18    Temp:  (!) 97.5 F (36.4 C) 97.8 F (36.6 C)   TempSrc:  Oral Oral   SpO2: 100% 98% 94%   Weight:   73.9 kg (162 lb 14.4 oz)   Height:        Intake/Output Summary (Last 24 hours) at 02/27/2017 0959 Last data filed at 02/27/2017 0700 Gross per 24 hour  Intake 360 ml  Output 200 ml  Net 160 ml   Filed Weights   02/25/17 2240 02/26/17 0400 02/27/17 0513  Weight: 74 kg (163 lb 2.3 oz) 73.7 kg (162 lb 8 oz) 73.9 kg (162 lb 14.4 oz)    Telemetry    NSR - Personally Reviewed  ECG    Atrial-sensed ventricular-paced rhythm - Personally Reviewed  Physical Exam   GEN: No acute distress.   Neck: No JVD Cardiac: RRR, no murmurs, rubs, or gallops.  Respiratory: Clear to auscultation bilaterally.  MS: No edema; No deformity. Neuro:   Nonfocal  Psych: Normal affect   Labs    Chemistry Recent Labs  Lab 02/25/17 1736 02/26/17 0733 02/27/17 0619  NA 139 140 140  K 3.8 3.8 3.7  CL 103 104 102  CO2 27 26 26   GLUCOSE 103* 109* 102*  BUN 19 15 17   CREATININE 1.01* 0.90 1.00  CALCIUM 9.0 8.7* 9.1  PROT 7.2  --  6.1*  ALBUMIN 3.9  --  3.2*  AST 31  --  30  ALT 32  --  30  ALKPHOS 76  --  71  BILITOT 0.6  --  0.5  GFRNONAA 58* >60 59*  GFRAA >60 >60 >60  ANIONGAP 9 10 12      Hematology Recent Labs  Lab 02/25/17 1736 02/26/17 0733 02/27/17 0619  WBC 11.6* 8.9 11.9*  RBC 4.80 4.60 4.35  HGB 14.9 14.3 13.5  HCT 44.0 43.0 41.4  MCV 91.7 93.5 95.2  MCH 31.0 31.1 31.0  MCHC 33.9 33.3 32.6  RDW 13.3 13.7 13.8  PLT 310 281 306    Cardiac Enzymes Recent Labs  Lab 02/25/17 1736  TROPONINI <0.03   No results for input(s): TROPIPOC in the last 168 hours.   BNP Recent Labs  Lab  02/25/17 1736  BNP 18.5     DDimer No results for input(s): DDIMER in the last 168 hours.   Radiology    Dg Chest 2 View  Result Date: 02/25/2017 CLINICAL DATA:  Pain around pacemaker EXAM: CHEST  2 VIEW COMPARISON:  01/21/2011 FINDINGS: Lungs are hyperexpanded. The lungs are clear without focal pneumonia, edema, pneumothorax or pleural effusion. Battery pack for left pacer/AICD is exchanged in the interval. Thoracolumbar scoliosis with thoracolumbar fusion hardware again noted. Bones are diffusely demineralized. IMPRESSION: No active cardiopulmonary disease. Electronically Signed   By: Misty Stanley M.D.   On: 02/25/2017 17:38   Ct Abdomen Pelvis W Contrast  Result Date: 02/25/2017 CLINICAL DATA:  Left lower quadrant abdominal pain. Decreased appetite. EXAM: CT ABDOMEN AND PELVIS WITH CONTRAST TECHNIQUE: Multidetector CT imaging of the abdomen and pelvis was performed using the standard protocol following bolus administration of intravenous contrast. CONTRAST:  170mL ISOVUE-300 IOPAMIDOL (ISOVUE-300) INJECTION 61%  COMPARISON:  None. FINDINGS: Lower chest: Minimal ground-glass in the right base on series 4, image 5 could represent atelectasis or a subtle infectious process. No other changes in the lung bases. Hepatobiliary: Hepatic steatosis. Previous cholecystectomy. Patent portal vein. Pancreas: Unremarkable. No pancreatic ductal dilatation or surrounding inflammatory changes. Spleen: There is a wedge-shaped band of decreased attenuation in the spleen which is a new finding since 2017 consistent with a splenic infarct. The remainder of the spleen is normal. Adrenals/Urinary Tract: Tiny renal lesions are likely cysts but too small to characterize. No ureterectasis, ureteral stone, or bladder abnormality. Adrenal glands are normal. Stomach/Bowel: The stomach and small bowel are normal. Moderate fecal loading throughout much of the colon. The appendix is normal. Vascular/Lymphatic: Mild atherosclerotic change in the abdominal aorta which is nonaneurysmal. No adenopathy. Reproductive: Status post hysterectomy. No adnexal masses. Other: No abdominal wall hernia or abnormality. No abdominopelvic ascites. Musculoskeletal: Postsurgical changes in the spine are stable. IMPRESSION: 1. 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 While age indeterminate, an acute infarct is not excluded. 2. Minimal ground-glass in the right lung base may represent atelectasis or a subtle infectious process. 3. Mild atherosclerotic change in the abdominal aorta. 4. Moderate fecal loading in the colon. Electronically Signed   By: Dorise Bullion III M.D   On: 02/25/2017 19:28    Cardiac Studies  TTE 2/17  Study Conclusions - Left ventricle: The cavity size was normal. Wall thickness was   normal. Systolic function was normal. The estimated ejection   fraction was in the range of 60% to 65%. Wall motion was normal;   there were no regional wall motion abnormalities. Doppler   parameters are consistent with abnormal left  ventricular   relaxation (grade 1 diastolic dysfunction). The E/e&' ratio is <8,   suggesting normal LV filling pressure. - Aortic valve: Sclerosis without stenosis. There was trivial   regurgitation. - Left atrium: The atrium was normal in size. - Tricuspid valve: There was trivial regurgitation. - Pulmonary arteries: PA peak pressure: 30 mm Hg (S). - Inferior vena cava: The vessel was normal in size. The   respirophasic diameter changes were in the normal range (>= 50%),   consistent with normal central venous pressure.  Impressions:  - Compared to a prior study in 2018, the LVEF is higher at 60-65%   and there is now grade 1 DD with normal LV filling pressure.  Patient Profile     62 y.o. female  with a hx of NICM (normal cors 2012),  chronic systolic CHF, LBBB s/p BiV-ICD 2013 with downgrade to BiV-PPM only 11/2016 (normalization of EF to 55%), paroxysmal atrial fib (?2010, details unclear, not on anticoagulation prior to admission), GERD, HTN, thyroid disease (listed as both hyper/hypo), mild AI, migraines, Raynaud's disease, scoliosis, essential tremor (on propranolol) who presents with splenici infarct.   Assessment & Plan    Splenic infarct Patient presented with abdominal pain.  Abodminal CT showed splenic infarct.  Echo was obtained to assess LV function and look for possible LV clot.  LV EF is 60-65% and no LV clot noted.  Will do TEE to exclude cardiac source of embolism and to assess if PFO present or absent for the possibility of a paradoxical clot.  If all work up unrevealing would consider a hypercoagulable workup. -plan for TEE on wednesday  Paroxysmal atrial fibrillation Device was interrogated and showed 1-2 mintues of afib.   CHADS-VASc is 3.  Not currently on anticoagulation. There is a question if patient even has a history of paroxysmal afib as notes are unrevealing of this history.  For questions or updates, please contact [place] Please consult  [email or web address] for contact info under Cardiology/STEMI.      Signed, Boyd Kerbs, DO  02/27/2017, 9:59 AM    Attending Note:   The patient was seen and examined.  Agree with assessment and plan as noted above.  Changes made to the above note as needed.  Patient seen and independently examined with Kalman Shan, DO .   We discussed all aspects of the encounter. I agree with the assessment and plan as stated above.  1.  Arterial emboli: The patient is a 62 year old female with a history of nonischemic cardiomyopathy.  She has chronic systolic congestive heart failure and has a biventricular ICD. He carries a diagnosis of paroxysmal atrial fibrillation but has not had any significant episodes of atrial fibrillation in many years.  Original diagnosis was made around 2010.  She has not been on anticoagulation.  She has had some abdominal pain was found to have a splenic infarct.  We are asked to see her today to evaluate the possibility of cardiac source of this embolus.  She had her device interrogated and only has had a minute or 2 of paroxysmal atrial fibrillation.  I discussed this with Dr. Lovena Le.  It is difficult to say whether this is enough atrial fibrillation to cause her to have thrombosis and subsequent embolus.  We will schedule her for a transesophageal echo on Wednesday.  We discussed the risks, benefits, and options of TEE.  She understands and agrees to proceed.   I have spent a total of 40 minutes with patient reviewing hospital  notes , telemetry, EKGs, labs and examining patient as well as establishing an assessment and plan that was discussed with the patient. > 50% of time was spent in direct patient care.    Thayer Headings, Brooke Bonito., MD, [facility] 02/27/2017, 11:10 AM [address],  [place] Pager [phone]

## 2017-02-27 NOTE — Progress Notes (Signed)
PROGRESS NOTE    [name]  UUV:[id number] DOB: 1955/05/04 DOA: 02/25/2017 PCP: Glenford Bayley, DO  Brief Narrative:62 y.o.femalewith medical history significant forparoxysmal atrial fibrillation not on anticoagulation, history of systolic CHF with AICD and normal EF on most recent echo, hypothyroidism, and hypertension, now presenting to the emergency department for evaluation of severe abdominal patient reports that she had been in her usual state until noting the onset of pain in the left upper abdomen and left flank approximately 1 week ago. Since that time, symptoms have progressively worsened and the pain has become more generalized. She denies fevers or chills, denies chest pain, denies palpitations, denies headache, change in vision or hearing, or focal numbness or weakness.  [facility]:Upon arrival to the ED, patient is found to be afebrile, saturating well on room air, blood pressure 103/57, and vitals otherwise normal. EKG features a paced rhythm, chest x-rays negative for acute cardiopulmonary disease, CBC features a mild leukocytosis, chemistry panel is unremarkable, lactic acid is normal, troponin is undetectable, and urinalysis is unremarkable. Blood cultures were collected in the emergency department and the patient was provided with analgesics. She remains hemodynamically stable, no apparent respiratory distress, and she will be admitted to the telemetry unit for ongoing evaluation and management of splenic infarct  02/26/17-patient complains of pain in the left side of her abdomen 7 out of 10.  She is allergic to codeine fentanyl and morphine and she does not want to take Dilaudid or hydrocodone at this time.  She prefers to just keep taking Tylenol.  Denies any nausea vomiting diarrhea.  No fever or chills   Assessment & Plan:   Principal Problem:   Splenic infarction Active Problems:   Hypothyroidism   History of migraine headaches   AF  (paroxysmal atrial fibrillation) (HCC)   AICD (automatic cardioverter/defibrillator) present   Benign essential HTN   Splenic infarct 1.Splenic infarct -Presents with severe abdominal pain, mainly LUQ and left flank -Found to have splenic infarct on CT -She has hx of PAF without anticoagulation and thromboembolic etiology considered -No other findings to suggest endocarditis -Blood cultures collected in ED, echo ordered, continue cardiac monitoring, supportive care with analgesia -Hypercoagulable workup ordered.  Patient not started on anticoagulation yet.  2.Paroxysmal atrial fibrillation -In a sinus rhythm on admission -CHADS-VASc may only be 2 (gender, HTN); there was a possible TIA historically as well -Follows with cardiology, not anticoagulated -Splenic infarct could be related, will continue cardiac monitoring, interrogate pacer.  Appreciate cardiology input.  Patient to have TEE done on Wednesday to rule out PFO. 3.Hx of CHF with AICD -Normal EF and no diastolic dysfunction on most recent echo from November '18 -Appears euvolemic -Diuretics held on admission for BP on low side  4.Hypertension -BP has been on low side, but stable -Hold diuretics initially -Decrease Inderal to 20 mg daily  5.Hypothyroidism -Continue Synthroid     DVT prophylaxis: Lovenox Code Status: Full code Family Communication: No family available Disposition Plan: TBD   Consultants: Cardiology  Procedures: None Antimicrobials: None  Subjective: She feels her pain is better.  She walked in the hallway yesterday.  Objective: Vitals:   02/26/17 1551 02/26/17 2033 02/27/17 0513 02/27/17 0854  BP: (!) 119/58 107/62 (!) 116/50 97/69  Pulse: 75 74 83 76  Resp: 20 18 18    Temp:  (!) 97.5 F (36.4 C) 97.8 F (36.6 C)   TempSrc:  Oral Oral   SpO2: 100% 98% 94%   Weight:  73.9 kg (162 lb 14.4 oz)   Height:        Intake/Output Summary (Last 24  hours) at 02/27/2017 1207 Last data filed at 02/27/2017 0700 Gross per 24 hour  Intake 360 ml  Output 200 ml  Net 160 ml   Filed Weights   02/25/17 2240 02/26/17 0400 02/27/17 0513  Weight: 74 kg (163 lb 2.3 oz) 73.7 kg (162 lb 8 oz) 73.9 kg (162 lb 14.4 oz)    Examination:  General exam: Appears calm and comfortable  Respiratory system: Clear to auscultation. Respiratory effort normal. Cardiovascular system: S1 & S2 heard, RRR. No JVD, murmurs, rubs, gallops or clicks. No pedal edema. Gastrointestinal system: Abdomen is nondistended, soft and nontender. No organomegaly or masses felt. Normal bowel sounds heard. Central nervous system: Alert and oriented. No focal neurological deficits. Extremities: Symmetric 5 x 5 power. Skin: No rashes, lesions or ulcers Psychiatry: Judgement and insight appear normal. Mood & affect appropriate.     Data Reviewed: I have personally reviewed following labs and imaging studies  CBC: Recent Labs  Lab 02/25/17 1736 02/26/17 0733 02/27/17 0619  WBC 11.6* 8.9 11.9*  NEUTROABS  --   --  7.9*  HGB 14.9 14.3 13.5  HCT 44.0 43.0 41.4  MCV 91.7 93.5 95.2  PLT 310 281 409   Basic Metabolic Panel: Recent Labs  Lab 02/25/17 1736 02/26/17 0733 02/27/17 0619  NA 139 140 140  K 3.8 3.8 3.7  CL 103 104 102  CO2 27 26 26   GLUCOSE 103* 109* 102*  BUN 19 15 17   CREATININE 1.01* 0.90 1.00  CALCIUM 9.0 8.7* 9.1   GFR: Estimated Creatinine Clearance: 56 mL/min (by C-G formula based on SCr of 1 mg/dL). Liver Function Tests: Recent Labs  Lab 02/25/17 1736 02/27/17 0619  AST 31 30  ALT 32 30  ALKPHOS 76 71  BILITOT 0.6 0.5  PROT 7.2 6.1*  ALBUMIN 3.9 3.2*   Recent Labs  Lab 02/25/17 1736  LIPASE 31   No results for input(s): AMMONIA in the last 168 hours. Coagulation Profile: No results for input(s): INR, PROTIME in the last 168 hours. Cardiac Enzymes: Recent Labs  Lab 02/25/17 1736  TROPONINI <0.03   BNP (last 3 results) No  results for input(s): PROBNP in the last 8760 hours. HbA1C: No results for input(s): HGBA1C in the last 72 hours. CBG: No results for input(s): GLUCAP in the last 168 hours. Lipid Profile: No results for input(s): CHOL, HDL, LDLCALC, TRIG, CHOLHDL, LDLDIRECT in the last 72 hours. Thyroid Function Tests: No results for input(s): TSH, T4TOTAL, FREET4, T3FREE, THYROIDAB in the last 72 hours. Anemia Panel: No results for input(s): VITAMINB12, FOLATE, FERRITIN, TIBC, IRON, RETICCTPCT in the last 72 hours. Sepsis Labs: Recent Labs  Lab 02/25/17 1750  LATICACIDVEN 0.86    Recent Results (from the past 240 hour(s))  Blood culture (routine x 2)     Status: None (Preliminary result)   Collection Time: 02/25/17  8:50 PM  Result Value Ref Range Status   Specimen Description   Final    BLOOD RIGHT ANTECUBITAL Performed at [facility], [address] [place], [place] [address]    Special Requests   Final    BOTTLES DRAWN AEROBIC AND ANAEROBIC Blood Culture adequate volume Performed at [facility], [address] [place], [place] [address]    Culture   Final    NO GROWTH < 24 HOURS Performed at [facility], [address], [place], [place] [address]    Report Status PENDING  Incomplete  Blood culture (routine x 2)     Status: None (Preliminary result)   Collection Time: 02/25/17  9:00 PM  Result Value Ref Range Status   Specimen Description   Final    BLOOD LEFT ANTECUBITAL Performed at [facility], [address] [place], [place] [address]    Special Requests   Final    BOTTLES DRAWN AEROBIC AND ANAEROBIC Blood Culture results may not be optimal due to an inadequate volume of blood received in culture bottles Performed at [facility], [address] [place], [place] [address]    Culture   Final    NO GROWTH < 24 HOURS Performed at [facility], [place] [address] [place], [place] [address]    Report Status PENDING   Incomplete         Radiology Studies: Dg Chest 2 View  Result Date: 02/25/2017 CLINICAL DATA:  Pain around pacemaker EXAM: CHEST  2 VIEW COMPARISON:  01/21/2011 FINDINGS: Lungs are hyperexpanded. The lungs are clear without focal pneumonia, edema, pneumothorax or pleural effusion. Battery pack for left pacer/AICD is exchanged in the interval. Thoracolumbar scoliosis with thoracolumbar fusion hardware again noted. Bones are diffusely demineralized. IMPRESSION: No active cardiopulmonary disease. Electronically Signed   By: Misty Stanley M.D.   On: 02/25/2017 17:38   Ct Abdomen Pelvis W Contrast  Result Date: 02/25/2017 CLINICAL DATA:  Left lower quadrant abdominal pain. Decreased appetite. EXAM: CT ABDOMEN AND PELVIS WITH CONTRAST TECHNIQUE: Multidetector CT imaging of the abdomen and pelvis was performed using the standard protocol following bolus administration of intravenous contrast. CONTRAST:  147mL ISOVUE-300 IOPAMIDOL (ISOVUE-300) INJECTION 61% COMPARISON:  None. FINDINGS: Lower chest: Minimal ground-glass in the right base on series 4, image 5 could represent atelectasis or a subtle infectious process. No other changes in the lung bases. Hepatobiliary: Hepatic steatosis. Previous cholecystectomy. Patent portal vein. Pancreas: Unremarkable. No pancreatic ductal dilatation or surrounding inflammatory changes. Spleen: There is a wedge-shaped band of decreased attenuation in the spleen which is a new finding since 2017 consistent with a splenic infarct. The remainder of the spleen is normal. Adrenals/Urinary Tract: Tiny renal lesions are likely cysts but too small to characterize. No ureterectasis, ureteral stone, or bladder abnormality. Adrenal glands are normal. Stomach/Bowel: The stomach and small bowel are normal. Moderate fecal loading throughout much of the colon. The appendix is normal. Vascular/Lymphatic: Mild atherosclerotic change in the abdominal aorta which is nonaneurysmal. No  adenopathy. Reproductive: Status post hysterectomy. No adnexal masses. Other: No abdominal wall hernia or abnormality. No abdominopelvic ascites. Musculoskeletal: Postsurgical changes in the spine are stable. IMPRESSION: 1. 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 While age indeterminate, an acute infarct is not excluded. 2. Minimal ground-glass in the right lung base may represent atelectasis or a subtle infectious process. 3. Mild atherosclerotic change in the abdominal aorta. 4. Moderate fecal loading in the colon. Electronically Signed   By: Dorise Bullion III M.D   On: 02/25/2017 19:28        Scheduled Meds: . amitriptyline  25 mg Oral QHS  . diclofenac sodium  2 g Topical Daily  . diphenhydrAMINE  25 mg Oral QHS  . enoxaparin (LOVENOX) injection  40 mg Subcutaneous Q24H  . gabapentin  600 mg Oral BID  . levothyroxine  100 mcg Oral QAC breakfast  . Melatonin  6  mg Oral QHS  . polyethylene glycol  17 g Oral QPM  . polyvinyl alcohol  1 drop Both Eyes Daily  . propranolol  20 mg Oral Daily  . sodium chloride flush  3 mL Intravenous Q12H  . sodium chloride flush  3 mL Intravenous Q12H   Continuous Infusions: . sodium chloride       LOS: 2 days      Georgette Shell, MD [facility]  If 7PM-7AM, please contact night-coverage [email or web address] Password [facility] 02/27/2017, 12:07 PM

## 2017-02-27 NOTE — Progress Notes (Signed)
MD states to keep pt NPO.

## 2017-02-28 LAB — LUPUS ANTICOAGULANT PANEL
DRVVT: 37.3 s (ref 0.0–47.0)
PTT Lupus Anticoagulant: 50.8 s (ref 0.0–51.9)

## 2017-02-28 LAB — BETA-2-GLYCOPROTEIN I ABS, IGG/M/A
Beta-2 Glyco I IgG: <9 GPI IgG units (ref 0–20)
Beta-2-Glycoprotein I IgA: <9 GPI IgA units (ref 0–25)
Beta-2-Glycoprotein I IgM: <9 GPI IgM units (ref 0–32)

## 2017-02-28 LAB — CARDIOLIPIN ANTIBODIES, IGG, IGM, IGA
Anticardiolipin IgA: <9 APL U/mL (ref 0–11)
Anticardiolipin IgG: <9 GPL U/mL (ref 0–14)
Anticardiolipin IgM: 10 MPL U/mL (ref 0–12)

## 2017-02-28 LAB — PROTEIN C, TOTAL: Protein C, Total: 116 % (ref 60–150)

## 2017-02-28 LAB — PROTEIN S ACTIVITY: Protein S Activity: 94 % (ref 63–140)

## 2017-02-28 LAB — OCCULT BLOOD X 1 CARD TO LAB, STOOL: Fecal Occult Bld: NEGATIVE

## 2017-02-28 LAB — PROTEIN S, TOTAL: Protein S Ag, Total: 130 % (ref 60–150)

## 2017-02-28 LAB — HOMOCYSTEINE: Homocysteine: 14.3 umol/L (ref 0.0–15.0)

## 2017-02-28 LAB — PROTEIN C ACTIVITY: Protein C Activity: 156 % (ref 73–180)

## 2017-02-28 NOTE — Progress Notes (Signed)
Progress Note  Patient Name: [name] Date of Encounter: 02/28/2017  Primary Cardiologist: Cristopher Peru, MD , Wynonia Lawman  Subjective   62 year old female with a history of congestive heart failure in the past.  She has an AICD.  Her ejection fraction has normalized.  She has a known history of remote paroxysmal atrial fibrillation.  She presented with a splenic infarct.  The ICD has been interrogated and showed only has a minute or 2 of atrial fibrillation on a very rare basis.  The A. fib burden does not seem to be enough to explain her splenic from embolus.  She is scheduled for transesophageal echocardiogram tomorrow.  Inpatient Medications    Scheduled Meds: . amitriptyline  25 mg Oral QHS  . diclofenac sodium  2 g Topical Daily  . diphenhydrAMINE  25 mg Oral QHS  . enoxaparin (LOVENOX) injection  40 mg Subcutaneous Q24H  . gabapentin  600 mg Oral BID  . levothyroxine  100 mcg Oral QAC breakfast  . Melatonin  6 mg Oral QHS  . polyethylene glycol  17 g Oral QPM  . polyvinyl alcohol  1 drop Both Eyes Daily  . propranolol  10 mg Oral Daily  . sodium chloride flush  3 mL Intravenous Q12H  . sodium chloride flush  3 mL Intravenous Q12H   Continuous Infusions: . sodium chloride     PRN Meds: sodium chloride, acetaminophen, bisacodyl, eletriptan, HYDROcodone-acetaminophen, ondansetron (ZOFRAN) IV, pantoprazole, senna-docusate, sodium chloride flush   Vital Signs    Vitals:   02/27/17 1400 02/27/17 1920 02/28/17 0356 02/28/17 0932  BP: 98/64 (!) 101/51 (!) 91/56 97/68  Pulse: 87 85 75 98  Resp: 20 20 20    Temp: 98.2 F (36.8 C) 98 F (36.7 C) 97.7 F (36.5 C)   TempSrc: Oral Oral Oral   SpO2: 100% 96% 97%   Weight:   162 lb 12.8 oz (73.8 kg)   Height:        Intake/Output Summary (Last 24 hours) at 02/28/2017 0945 Last data filed at 02/28/2017 0936 Gross per 24 hour  Intake 415 ml  Output 250 ml  Net 165 ml   Filed Weights   02/26/17 0400 02/27/17 0513  02/28/17 0356  Weight: 162 lb 8 oz (73.7 kg) 162 lb 14.4 oz (73.9 kg) 162 lb 12.8 oz (73.8 kg)    Telemetry     NSR  - Personally Reviewed  ECG     NSR  - Personally Reviewed  Physical Exam   GEN: No acute distress.   Neck: No JVD Cardiac: RRR,   Mid systolic click  Respiratory: Clear to auscultation bilaterally. GI: Soft, nontender, non-distended  MS: No edema; No deformity. Neuro:  Nonfocal  Psych: Normal affect   Labs    Chemistry Recent Labs  Lab 02/25/17 1736 02/26/17 0733 02/27/17 0619  NA 139 140 140  K 3.8 3.8 3.7  CL 103 104 102  CO2 27 26 26   GLUCOSE 103* 109* 102*  BUN 19 15 17   CREATININE 1.01* 0.90 1.00  CALCIUM 9.0 8.7* 9.1  PROT 7.2  --  6.1*  ALBUMIN 3.9  --  3.2*  AST 31  --  30  ALT 32  --  30  ALKPHOS 76  --  71  BILITOT 0.6  --  0.5  GFRNONAA 58* >60 59*  GFRAA >60 >60 >60  ANIONGAP 9 10 12      Hematology Recent Labs  Lab 02/25/17 1736 02/26/17 1287 02/27/17 8676  WBC 11.6* 8.9 11.9*  RBC 4.80 4.60 4.35  HGB 14.9 14.3 13.5  HCT 44.0 43.0 41.4  MCV 91.7 93.5 95.2  MCH 31.0 31.1 31.0  MCHC 33.9 33.3 32.6  RDW 13.3 13.7 13.8  PLT 310 281 306    Cardiac Enzymes Recent Labs  Lab 02/25/17 1736  TROPONINI <0.03   No results for input(s): TROPIPOC in the last 168 hours.   BNP Recent Labs  Lab 02/25/17 1736  BNP 18.5     DDimer No results for input(s): DDIMER in the last 168 hours.   Radiology    No results found.  Cardiac Studies     Patient Profile     62 y.o. female with hx of PAF ( very short episodes ) who was admitted with a splenic infarct.  Assessment & Plan    1.  Paroxysmal atrial fibrillation: Patient has had some documented episodes of PAF.  These have only lasted for 1 or 2 minutes and the thinking is that this is not long enough to necessarily cause thrombus formation.  She is scheduled for transesophageal echo tomorrow to evaluate her further for the possibility of a left atrial thrombus and  possibly an ASD/PFO.  For questions or updates, please contact [place] Please consult [email or web address] for contact info under Cardiology/STEMI.      Signed, Mertie Moores, MD  02/28/2017, 9:45 AM

## 2017-02-28 NOTE — H&P (View-Only) (Signed)
Progress Note  Patient Name: [name] Date of Encounter: 02/28/2017  Primary Cardiologist: Cristopher Peru, MD , Wynonia Lawman  Subjective   62 year old female with a history of congestive heart failure in the past.  She has an AICD.  Her ejection fraction has normalized.  She has a known history of remote paroxysmal atrial fibrillation.  She presented with a splenic infarct.  The ICD has been interrogated and showed only has a minute or 2 of atrial fibrillation on a very rare basis.  The A. fib burden does not seem to be enough to explain her splenic from embolus.  She is scheduled for transesophageal echocardiogram tomorrow.  Inpatient Medications    Scheduled Meds: . amitriptyline  25 mg Oral QHS  . diclofenac sodium  2 g Topical Daily  . diphenhydrAMINE  25 mg Oral QHS  . enoxaparin (LOVENOX) injection  40 mg Subcutaneous Q24H  . gabapentin  600 mg Oral BID  . levothyroxine  100 mcg Oral QAC breakfast  . Melatonin  6 mg Oral QHS  . polyethylene glycol  17 g Oral QPM  . polyvinyl alcohol  1 drop Both Eyes Daily  . propranolol  10 mg Oral Daily  . sodium chloride flush  3 mL Intravenous Q12H  . sodium chloride flush  3 mL Intravenous Q12H   Continuous Infusions: . sodium chloride     PRN Meds: sodium chloride, acetaminophen, bisacodyl, eletriptan, HYDROcodone-acetaminophen, ondansetron (ZOFRAN) IV, pantoprazole, senna-docusate, sodium chloride flush   Vital Signs    Vitals:   02/27/17 1400 02/27/17 1920 02/28/17 0356 02/28/17 0932  BP: 98/64 (!) 101/51 (!) 91/56 97/68  Pulse: 87 85 75 98  Resp: 20 20 20    Temp: 98.2 F (36.8 C) 98 F (36.7 C) 97.7 F (36.5 C)   TempSrc: Oral Oral Oral   SpO2: 100% 96% 97%   Weight:   162 lb 12.8 oz (73.8 kg)   Height:        Intake/Output Summary (Last 24 hours) at 02/28/2017 0945 Last data filed at 02/28/2017 0936 Gross per 24 hour  Intake 415 ml  Output 250 ml  Net 165 ml   Filed Weights   02/26/17 0400 02/27/17 0513  02/28/17 0356  Weight: 162 lb 8 oz (73.7 kg) 162 lb 14.4 oz (73.9 kg) 162 lb 12.8 oz (73.8 kg)    Telemetry     NSR  - Personally Reviewed  ECG     NSR  - Personally Reviewed  Physical Exam   GEN: No acute distress.   Neck: No JVD Cardiac: RRR,   Mid systolic click  Respiratory: Clear to auscultation bilaterally. GI: Soft, nontender, non-distended  MS: No edema; No deformity. Neuro:  Nonfocal  Psych: Normal affect   Labs    Chemistry Recent Labs  Lab 02/25/17 1736 02/26/17 0733 02/27/17 0619  NA 139 140 140  K 3.8 3.8 3.7  CL 103 104 102  CO2 27 26 26   GLUCOSE 103* 109* 102*  BUN 19 15 17   CREATININE 1.01* 0.90 1.00  CALCIUM 9.0 8.7* 9.1  PROT 7.2  --  6.1*  ALBUMIN 3.9  --  3.2*  AST 31  --  30  ALT 32  --  30  ALKPHOS 76  --  71  BILITOT 0.6  --  0.5  GFRNONAA 58* >60 59*  GFRAA >60 >60 >60  ANIONGAP 9 10 12      Hematology Recent Labs  Lab 02/25/17 1736 02/26/17 7494 02/27/17 4967  WBC 11.6* 8.9 11.9*  RBC 4.80 4.60 4.35  HGB 14.9 14.3 13.5  HCT 44.0 43.0 41.4  MCV 91.7 93.5 95.2  MCH 31.0 31.1 31.0  MCHC 33.9 33.3 32.6  RDW 13.3 13.7 13.8  PLT 310 281 306    Cardiac Enzymes Recent Labs  Lab 02/25/17 1736  TROPONINI <0.03   No results for input(s): TROPIPOC in the last 168 hours.   BNP Recent Labs  Lab 02/25/17 1736  BNP 18.5     DDimer No results for input(s): DDIMER in the last 168 hours.   Radiology    No results found.  Cardiac Studies     Patient Profile     62 y.o. female with hx of PAF ( very short episodes ) who was admitted with a splenic infarct.  Assessment & Plan    1.  Paroxysmal atrial fibrillation: Patient has had some documented episodes of PAF.  These have only lasted for 1 or 2 minutes and the thinking is that this is not long enough to necessarily cause thrombus formation.  She is scheduled for transesophageal echo tomorrow to evaluate her further for the possibility of a left atrial thrombus and  possibly an ASD/PFO.  For questions or updates, please contact [place] Please consult [email or web address] for contact info under Cardiology/STEMI.      Signed, Mertie Moores, MD  02/28/2017, 9:45 AM

## 2017-02-28 NOTE — Progress Notes (Signed)
PROGRESS NOTE    [name]  KGM:[id number] DOB: 06/19/1955 DOA: 02/25/2017 PCP: Glenford Bayley, DO  Brief Narrative: 62 y.o.femalewith medical history significant forparoxysmal atrial fibrillation not on anticoagulation, history of systolic CHF with AICD and normal EF on most recent echo, hypothyroidism, and hypertension, now presenting to the emergency department for evaluation of severe abdominal patient reports that she had been in her usual state until noting the onset of pain in the left upper abdomen and left flank approximately 1 week ago. Since that time, symptoms have progressively worsened and the pain has become more generalized. She denies fevers or chills, denies chest pain, denies palpitations, denies headache, change in vision or hearing, or focal numbness or weakness.  [facility]:Upon arrival to the ED, patient is found to be afebrile, saturating well on room air, blood pressure 103/57, and vitals otherwise normal. EKG features a paced rhythm, chest x-rays negative for acute cardiopulmonary disease, CBC features a mild leukocytosis, chemistry panel is unremarkable, lactic acid is normal, troponin is undetectable, and urinalysis is unremarkable. Blood cultures were collected in the emergency department and the patient was provided with analgesics. She remains hemodynamically stable, no apparent respiratory distress, and she will be admitted to the telemetry unit for ongoing evaluation and management of splenic infarct  02/26/17-patient complains of pain in the left side of her abdomen 7 out of 10. She is allergic to codeine fentanyl and morphine and she does not want to take Dilaudid or hydrocodone at this time. She prefers to just keep taking Tylenol. Denies any nausea vomiting diarrhea. No fever or chills  02/28/2017 patient admitted to2/ 16 with abdominal pain found to have splenic infarct.  She has a history of paroxysmal atrial fibrillation.   She is followed by cardiology.  She is waiting to have a TEE done 03/01/2017.  Further plans per cardiology.  Hypercoagulable workup has been sent.   Assessment & Plan:   Principal Problem:   Splenic infarction Active Problems:   Hypothyroidism   History of migraine headaches   AF (paroxysmal atrial fibrillation) (HCC)   AICD (automatic cardioverter/defibrillator) present   Benign essential HTN   Splenic infarct  1.Splenic infarct -Presents with severe abdominal pain, mainly LUQ and left flank -Found to have splenic infarct on CT -She has hx of PAF without anticoagulation and thromboembolic etiology considered -No other findings to suggest endocarditis -Blood cultures collected in ED, echo ordered, continue cardiac monitoring, supportive care with analgesia -Hypercoagulable workup ordered.  Patient not started on anticoagulation yet.  2.Paroxysmal atrial fibrillation -In a sinus rhythm on admission -CHADS-VASc may only be 2 (gender, HTN); there was a possible TIA historically as well -Follows with cardiology, not anticoagulated -Splenic infarct could be related, will continue cardiac monitoring, interrogate pacer.  Appreciate cardiology input.  Patient to have TEE done on Wednesday to rule out PFO. 3.Hx of CHF with AICD -Normal EF and no diastolic dysfunction on most recent echo from November '18 -Appears euvolemic -Diuretics held on admission for BP on low side  4.Hypertension -BP has been on low side, but stable -Hold diuretics initially -Decrease Inderal to 20 mg daily  5.Hypothyroidism -Continue Synthroid      DVT prophylaxis:  Code Status:  Family Communication Disposition Plan:  Consultants:  Procedures: Antimicrobials:   Subjective:  Objective: Vitals:   02/27/17 1400 02/27/17 1920 02/28/17 0356 02/28/17 0932  BP: 98/64 (!) 101/51 (!) 91/56 97/68  Pulse: 87 85 75 98  Resp: 20 20 20  Temp: 98.2 F (36.8  C) 98 F (36.7 C) 97.7 F (36.5 C)   TempSrc: Oral Oral Oral   SpO2: 100% 96% 97%   Weight:   73.8 kg (162 lb 12.8 oz)   Height:        Intake/Output Summary (Last 24 hours) at 02/28/2017 1245 Last data filed at 02/28/2017 0936 Gross per 24 hour  Intake 415 ml  Output 250 ml  Net 165 ml   Filed Weights   02/26/17 0400 02/27/17 0513 02/28/17 0356  Weight: 73.7 kg (162 lb 8 oz) 73.9 kg (162 lb 14.4 oz) 73.8 kg (162 lb 12.8 oz)    Examination:  General exam: Appears calm and comfortable  Respiratory system: Clear to auscultation. Respiratory effort normal. Cardiovascular system: S1 & S2 heard, RRR. No JVD, murmurs, rubs, gallops or clicks. No pedal edema. Gastrointestinal system: Abdomen is nondistended, soft and nontender. No organomegaly or masses felt. Normal bowel sounds heard. Central nervous system: Alert and oriented. No focal neurological deficits. Extremities: Symmetric 5 x 5 power. Skin: No rashes, lesions or ulcers Psychiatry: Judgement and insight appear normal. Mood & affect appropriate.     Data Reviewed: I have personally reviewed following labs and imaging studies  CBC: Recent Labs  Lab 02/25/17 1736 02/26/17 0733 02/27/17 0619  WBC 11.6* 8.9 11.9*  NEUTROABS  --   --  7.9*  HGB 14.9 14.3 13.5  HCT 44.0 43.0 41.4  MCV 91.7 93.5 95.2  PLT 310 281 408   Basic Metabolic Panel: Recent Labs  Lab 02/25/17 1736 02/26/17 0733 02/27/17 0619  NA 139 140 140  K 3.8 3.8 3.7  CL 103 104 102  CO2 27 26 26   GLUCOSE 103* 109* 102*  BUN 19 15 17   CREATININE 1.01* 0.90 1.00  CALCIUM 9.0 8.7* 9.1   GFR: Estimated Creatinine Clearance: 56 mL/min (by C-G formula based on SCr of 1 mg/dL). Liver Function Tests: Recent Labs  Lab 02/25/17 1736 02/27/17 0619  AST 31 30  ALT 32 30  ALKPHOS 76 71  BILITOT 0.6 0.5  PROT 7.2 6.1*  ALBUMIN 3.9 3.2*   Recent Labs  Lab 02/25/17 1736  LIPASE 31   No results for input(s): AMMONIA in the last 168  hours. Coagulation Profile: No results for input(s): INR, PROTIME in the last 168 hours. Cardiac Enzymes: Recent Labs  Lab 02/25/17 1736  TROPONINI <0.03   BNP (last 3 results) No results for input(s): PROBNP in the last 8760 hours. HbA1C: No results for input(s): HGBA1C in the last 72 hours. CBG: No results for input(s): GLUCAP in the last 168 hours. Lipid Profile: No results for input(s): CHOL, HDL, LDLCALC, TRIG, CHOLHDL, LDLDIRECT in the last 72 hours. Thyroid Function Tests: No results for input(s): TSH, T4TOTAL, FREET4, T3FREE, THYROIDAB in the last 72 hours. Anemia Panel: No results for input(s): VITAMINB12, FOLATE, FERRITIN, TIBC, IRON, RETICCTPCT in the last 72 hours. Sepsis Labs: Recent Labs  Lab 02/25/17 1750  LATICACIDVEN 0.86    Recent Results (from the past 240 hour(s))  Blood culture (routine x 2)     Status: None (Preliminary result)   Collection Time: 02/25/17  8:50 PM  Result Value Ref Range Status   Specimen Description   Final    BLOOD RIGHT ANTECUBITAL Performed at [facility], [address] [place], [place] [address]    Special Requests   Final    BOTTLES DRAWN AEROBIC AND ANAEROBIC Blood Culture adequate volume Performed at [facility]  [address], [address], [place], [place] [address]    Culture   Final    NO GROWTH 2 DAYS Performed at [facility], [place] [address], [place], [place] [address]    Report Status PENDING  Incomplete  Blood culture (routine x 2)     Status: None (Preliminary result)   Collection Time: 02/25/17  9:00 PM  Result Value Ref Range Status   Specimen Description   Final    BLOOD LEFT ANTECUBITAL Performed at [facility] [place] [facility], [address] [place], [place] [address]    Special Requests   Final    BOTTLES DRAWN AEROBIC AND ANAEROBIC Blood Culture results may not be optimal due to an inadequate volume of blood received in culture bottles Performed at [facility], [address] [place], [place] [address]    Culture   Final    NO GROWTH 2 DAYS Performed at [facility], [place] [address], [place], [place] [address]    Report Status PENDING  Incomplete         Radiology Studies: No results found.      Scheduled Meds: . amitriptyline  25 mg Oral QHS  . diclofenac sodium  2 g Topical Daily  . diphenhydrAMINE  25 mg Oral QHS  . enoxaparin (LOVENOX) injection  40 mg Subcutaneous Q24H  . gabapentin  600 mg Oral BID  . levothyroxine  100 mcg Oral QAC breakfast  . Melatonin  6 mg Oral QHS  . polyethylene glycol  17 g Oral QPM  . polyvinyl alcohol  1 drop Both Eyes Daily  . propranolol  10 mg Oral Daily  . sodium chloride flush  3 mL Intravenous Q12H  . sodium chloride flush  3 mL Intravenous Q12H   Continuous Infusions: . sodium chloride       LOS: 3 days     Georgette Shell, MD [facility] If 7PM-7AM, please contact night-coverage [email or web address] Password [facility] 02/28/2017, 12:45 PM

## 2017-02-28 NOTE — Plan of Care (Signed)
  Clinical Measurements: Ability to maintain clinical measurements within normal limits will improve 02/28/2017 0953 - Not Progressing by Imagene Gurney, RN

## 2017-03-01 ENCOUNTER — Inpatient Hospital Stay (HOSPITAL_COMMUNITY): Payer: Medicare Other

## 2017-03-01 ENCOUNTER — Inpatient Hospital Stay (HOSPITAL_COMMUNITY): Payer: Medicare Other | Admitting: Anesthesiology

## 2017-03-01 ENCOUNTER — Encounter (HOSPITAL_COMMUNITY): Payer: Self-pay | Admitting: Cardiovascular Disease

## 2017-03-01 ENCOUNTER — Encounter (HOSPITAL_COMMUNITY)
Admission: EM | Disposition: A | Discharge status: Home / Self Care | Payer: Self-pay | Source: Home / Self Care | Attending: Internal Medicine

## 2017-03-01 DIAGNOSIS — D735 Infarction of spleen: Secondary | ICD-10-CM

## 2017-03-01 DIAGNOSIS — I351 Nonrheumatic aortic (valve) insufficiency: Secondary | ICD-10-CM

## 2017-03-01 HISTORY — PX: TEE WITHOUT CARDIOVERSION: SHX5443

## 2017-03-01 LAB — ECHO TEE
Reg peak vel: 217 cm/s
TR max vel: 217 cm/s

## 2017-03-01 SURGERY — ECHOCARDIOGRAM, TRANSESOPHAGEAL
Anesthesia: Monitor Anesthesia Care

## 2017-03-01 MED ORDER — PROPOFOL 10 MG/ML IV BOLUS
INTRAVENOUS | Status: DC | PRN
  Administered 2017-03-01: 20 mg via INTRAVENOUS
  Administered 2017-03-01: 10 mg via INTRAVENOUS
  Administered 2017-03-01: 20 mg via INTRAVENOUS

## 2017-03-01 MED ORDER — MIDAZOLAM HCL 5 MG/ML IJ SOLN
INTRAMUSCULAR | Status: AC
  Filled 2017-03-01: qty 2

## 2017-03-01 MED ORDER — FENTANYL CITRATE (PF) 100 MCG/2ML IJ SOLN
INTRAMUSCULAR | Status: AC
  Filled 2017-03-01: qty 2

## 2017-03-01 MED ORDER — PROPOFOL 500 MG/50ML IV EMUL
INTRAVENOUS | Status: DC | PRN
  Administered 2017-03-01: 100 ug/kg/min via INTRAVENOUS

## 2017-03-01 MED ORDER — BUTAMBEN-TETRACAINE-BENZOCAINE 2-2-14 % EX AERO
INHALATION_SPRAY | CUTANEOUS | Status: DC | PRN
  Administered 2017-03-01: 2 via TOPICAL

## 2017-03-01 MED ORDER — LACTATED RINGERS IV SOLN
INTRAVENOUS | Status: AC | PRN
  Administered 2017-03-01: 1000 mL via INTRAVENOUS

## 2017-03-01 NOTE — Interval H&P Note (Signed)
History and Physical Interval Note:  03/01/2017 8:19 AM  [name]  has presented today for surgery, with the diagnosis of SPLENIC INFARCTION  The various methods of treatment have been discussed with the patient and family. After consideration of risks, benefits and other options for treatment, the patient has consented to  Procedure(s): TRANSESOPHAGEAL ECHOCARDIOGRAM (TEE) (N/A) as a surgical intervention .  The patient's history has been reviewed, patient examined, no change in status, stable for surgery.  I have reviewed the patient's chart and labs.  Questions were answered to the patient's satisfaction.     Skeet Latch, MD

## 2017-03-01 NOTE — Transfer of Care (Signed)
Immediate Anesthesia Transfer of Care Note  Patient: [name]  Procedure(s) Performed: TRANSESOPHAGEAL ECHOCARDIOGRAM (TEE) (N/A )  Patient Location: Endoscopy Unit  Anesthesia Type:MAC  Level of Consciousness: awake, oriented and patient cooperative  Airway & Oxygen Therapy: Patient Spontanous Breathing and Patient connected to nasal cannula oxygen  Post-op Assessment: Report given to RN and Post -op Vital signs reviewed and stable  Post vital signs: Reviewed  Last Vitals:  Vitals:   03/01/17 0823 03/01/17 0936  BP: (!) 121/39 (!) 99/51  Pulse:  83  Resp: 19 17  Temp: 36.9 C 36.6 C  SpO2: 100% 99%    Last Pain:  Vitals:   03/01/17 0936  TempSrc: Oral  PainSc:       Patients Stated Pain Goal: 0 (19/62/22 9798)  Complications: No apparent anesthesia complications

## 2017-03-01 NOTE — Progress Notes (Addendum)
Progress Note  Patient Name: [name] Date of Encounter: 03/01/2017  Primary Cardiologist: Cristopher Peru, MD , [place] Lawman  Subjective   States TEE went well.  Denies shortness of breath or chest pain.  Inpatient Medications    Scheduled Meds: . amitriptyline  25 mg Oral QHS  . diclofenac sodium  2 g Topical Daily  . diphenhydrAMINE  25 mg Oral QHS  . enoxaparin (LOVENOX) injection  40 mg Subcutaneous Q24H  . gabapentin  600 mg Oral BID  . levothyroxine  100 mcg Oral QAC breakfast  . Melatonin  6 mg Oral QHS  . polyethylene glycol  17 g Oral QPM  . polyvinyl alcohol  1 drop Both Eyes Daily  . propranolol  10 mg Oral Daily  . sodium chloride flush  3 mL Intravenous Q12H  . sodium chloride flush  3 mL Intravenous Q12H   Continuous Infusions: . sodium chloride     PRN Meds: sodium chloride, acetaminophen, bisacodyl, eletriptan, HYDROcodone-acetaminophen, ondansetron (ZOFRAN) IV, pantoprazole, senna-docusate, sodium chloride flush   Vital Signs    Vitals:   02/28/17 1957 03/01/17 0506 03/01/17 0823 03/01/17 0936  BP: (!) 99/55 (!) 102/53 (!) 121/39 (!) 99/51  Pulse: 77 78  83  Resp: 20 20 19 17   Temp: (!) 97.3 F (36.3 C) 97.8 F (36.6 C) 98.5 F (36.9 C) 97.8 F (36.6 C)  TempSrc: Oral Oral Oral Oral  SpO2: 100% 97% 100% 99%  Weight:  74.5 kg (164 lb 4.8 oz) 74.4 kg (164 lb)   Height:   5\' 7"  (1.702 m)     Intake/Output Summary (Last 24 hours) at 03/01/2017 1020 Last data filed at 03/01/2017 0930 Gross per 24 hour  Intake 850 ml  Output 300 ml  Net 550 ml   Filed Weights   02/28/17 0356 03/01/17 0506 03/01/17 0823  Weight: 73.8 kg (162 lb 12.8 oz) 74.5 kg (164 lb 4.8 oz) 74.4 kg (164 lb)    Telemetry     NSR, NSVT - Personally Reviewed  ECG     NSR  - Personally Reviewed  Physical Exam   GEN: No acute distress.   Neck: No JVD Cardiac: RRR,   Mid systolic click  Respiratory: Clear to auscultation bilaterally. GI: Soft, nontender,  non-distended  MS: No edema; No deformity. Neuro:  Nonfocal  Psych: Normal affect   Labs    Chemistry Recent Labs  Lab 02/25/17 1736 02/26/17 0733 02/27/17 0619  NA 139 140 140  K 3.8 3.8 3.7  CL 103 104 102  CO2 27 26 26   GLUCOSE 103* 109* 102*  BUN 19 15 17   CREATININE 1.01* 0.90 1.00  CALCIUM 9.0 8.7* 9.1  PROT 7.2  --  6.1*  ALBUMIN 3.9  --  3.2*  AST 31  --  30  ALT 32  --  30  ALKPHOS 76  --  71  BILITOT 0.6  --  0.5  GFRNONAA 58* >60 59*  GFRAA >60 >60 >60  ANIONGAP 9 10 12      Hematology Recent Labs  Lab 02/25/17 1736 02/26/17 0733 02/27/17 0619  WBC 11.6* 8.9 11.9*  RBC 4.80 4.60 4.35  HGB 14.9 14.3 13.5  HCT 44.0 43.0 41.4  MCV 91.7 93.5 95.2  MCH 31.0 31.1 31.0  MCHC 33.9 33.3 32.6  RDW 13.3 13.7 13.8  PLT 310 281 306    Cardiac Enzymes Recent Labs  Lab 02/25/17 1736  TROPONINI <0.03   No results for input(s): TROPIPOC in  the last 168 hours.   BNP Recent Labs  Lab 02/25/17 1736  BNP 18.5     DDimer No results for input(s): DDIMER in the last 168 hours.   Radiology    No results found.  Cardiac Studies     Patient Profile     62 year old female with a history of congestive heart failure in the past.  She has an AICD.  Her ejection fraction has normalized.  She has a known history of remote paroxysmal atrial fibrillation.  She presented with a splenic infarct.  Assessment & Plan    1.  Paroxysmal atrial fibrillation Patient has had some documented episodes of PAF.  These have only lasted for 1 or 2 minutes and not likely necessarily to cause thrombus formation. She had a TEE today that showed LVEF 60-65%, Mild AR and TR, Trivial PR and MR.  No LA/LAA thrombus or mass.  Saline microcavitation study was mildly positive with abdominal pressure but not at rest indicating possible shunting with high right sided pressure.  May need to consider lower extremity dopplers to assess dvts since there is possible cardiac shunt.       For  questions or updates, please contact [place] Please consult [email or web address] for contact info under Cardiology/STEMI.      Signed, Boyd Kerbs, DO  03/01/2017, 10:20 AM     Attending Note:   The patient was seen and examined.  Agree with assessment and plan as noted above.  Changes made to the above note as needed.  Patient seen and independently examined with  Kalman Shan , DO .   We discussed all aspects of the encounter. I agree with the assessment and plan as stated above.  1.  Splenic embolus: The patient was found to have a patent foramen ovale on transesophageal echo. She did not have a left atrial appendage thrombus.  We have ordered venous duplex scan of her lower extremities.  If we find that she has DVT then we will start long-term anticoagulation.  In the absence of venous thrombus, it is difficult to know whether the PFO had anything to do with the splenic embolus.     I have spent a total of 40 minutes with patient reviewing hospital  notes , telemetry, EKGs, labs and examining patient as well as establishing an assessment and plan that was discussed with the patient. > 50% of time was spent in direct patient care.   Thayer Headings, Brooke Bonito., MD, [facility] 03/01/2017, 2:29 PM [address]  [place] Pager [phone]

## 2017-03-01 NOTE — Care Management Important Message (Signed)
Important Message  Patient Details  Name: [name] MRN: [id number] Date of Birth: 09-07-1955   Medicare Important Message Given:  Yes    Elyanna Wallick 03/01/2017, 1:22 PM

## 2017-03-01 NOTE — CV Procedure (Signed)
Brief TEE Note  LVEF 60-65% Mild AR and TR Trivial PR and MR No LA/LAA thrombus or mass Saline microcavitation study was mildly positive with abdominal pressure but not at rest indicating possible shunting with high right sided pressure.  For additional details see full report.  Janayia Burggraf C. Oval Linsey, MD, [facility] 03/01/2017 9:32 AM

## 2017-03-01 NOTE — Progress Notes (Signed)
  Echocardiogram Echocardiogram Transesophageal has been performed.  Darlina Sicilian M 03/01/2017, 9:46 AM

## 2017-03-01 NOTE — Anesthesia Preprocedure Evaluation (Addendum)
Anesthesia Evaluation  Patient identified by MRN, date of birth, ID band Patient awake    Reviewed: Allergy & Precautions, H&P , NPO status , Patient's Chart, lab work & pertinent test results  Airway Mallampati: II  TM Distance: <3 FB Neck ROM: full    Dental  (+) Teeth Intact, Dental Advisory Given   Pulmonary neg pulmonary ROS,    breath sounds clear to auscultation       Cardiovascular hypertension, + Peripheral Vascular Disease and +CHF  + dysrhythmias Atrial Fibrillation + pacemaker + Cardiac Defibrillator  Rhythm:Regular Rate:Normal     Neuro/Psych  Headaches, Anxiety  Neuromuscular disease    GI/Hepatic GERD  ,  Endo/Other  Hypothyroidism Hyperthyroidism   Renal/GU      Musculoskeletal  (+) Arthritis ,   Abdominal   Peds  Hematology   Anesthesia Other Findings   Reproductive/Obstetrics                            Anesthesia Physical Anesthesia Plan  ASA: III  Anesthesia Plan: MAC   Post-op Pain Management:    Induction: Intravenous  PONV Risk Score and Plan: 2 and Treatment may vary due to age or medical condition and Propofol infusion  Airway Management Planned: Nasal Cannula  Additional Equipment:   Intra-op Plan:   Post-operative Plan:   Informed Consent: I have reviewed the patients History and Physical, chart, labs and discussed the procedure including the risks, benefits and alternatives for the proposed anesthesia with the patient or authorized representative who has indicated his/her understanding and acceptance.     Plan Discussed with: CRNA and Anesthesiologist  Anesthesia Plan Comments:        Anesthesia Quick Evaluation

## 2017-03-01 NOTE — Discharge Summary (Signed)
Physician Discharge Summary  [name] YNW:[id number] DOB: 03/05/1955 DOA: 02/25/2017  PCP: Glenford Bayley, DO  Admit date: 02/25/2017 Discharge date: 03/01/2017  Admitted From:home Disposition: home Recommendations for Outpatient Follow-up:  1. Follow up with PCP in 1-2 weeks 2. Please obtain BMP/CBC in one week Home Health none Equipment/Devices:none Discharge Condition:stable CODE STATUS:full Diet recommendation:cardiac Brief/Interim Summary:  62 y.o.femalewith medical history significant forparoxysmal atrial fibrillation not on anticoagulation, history of systolic CHF with AICD and normal EF on most recent echo, hypothyroidism, and hypertension, now presenting to the emergency department for evaluation of severe abdominal patient reports that she had been in her usual state until noting the onset of pain in the left upper abdomen and left flank approximately 1 week ago. Since that time, symptoms have progressively worsened and the pain has become more generalized. She denies fevers or chills, denies chest pain, denies palpitations, denies headache, change in vision or hearing, or focal numbness or weakness.  [facility]:Upon arrival to the ED, patient is found to be afebrile, saturating well on room air, blood pressure 103/57, and vitals otherwise normal. EKG features a paced rhythm, chest x-rays negative for acute cardiopulmonary disease, CBC features a mild leukocytosis, chemistry panel is unremarkable, lactic acid is normal, troponin is undetectable, and urinalysis is unremarkable. Blood cultures were collected in the emergency department and the patient was provided with analgesics. She remains hemodynamically stable, no apparent respiratory distress, and she will be admitted to the telemetry unit for ongoing evaluation and management of splenic infarct  02/26/17-patient complains of pain in the left side of her abdomen 7 out of 10. She is allergic to  codeine fentanyl and morphine and she does not want to take Dilaudid or hydrocodone at this time. She prefers to just keep taking Tylenol. Denies any nausea vomiting diarrhea. No fever or chills  02/28/2017 patient admitted to2/ 16 with abdominal pain found to have splenic infarct.  She has a history of paroxysmal atrial fibrillation.  She is followed by cardiology.  She is waiting to have a TEE done 03/01/2017.  Further plans per cardiology.  Hypercoagulable workup has been sent    Discharge Diagnoses:  Principal Problem:   Splenic infarction Active Problems:   Hypothyroidism   History of migraine headaches   AF (paroxysmal atrial fibrillation) (HCC)   AICD (automatic cardioverter/defibrillator) present   Benign essential HTN   Splenic infarct  1.Splenic infarct-had TEE today abnormal.await lower extremity doppler. -Presents with severe abdominal pain, mainly LUQ and left flank -Found to have splenic infarct on CT -She has hx of PAF without anticoagulation and thromboembolic etiology considered -No other findings to suggest endocarditis -Blood cultures collected in ED, echo ordered, continue cardiac monitoring, supportive care with analgesia -Hypercoagulable workup ordered. Patient not started on anticoagulation yet.  2.Paroxysmal atrial fibrillation -In a sinus rhythm on admission -CHADS-VASc may only be 2 (gender, HTN); there was a possible TIA historically as well -Follows with cardiology, not anticoagulated -Splenic infarct could be related, will continue cardiac monitoring, interrogate pacer.Appreciate cardiology input. Patient to have TEE done on Wednesday to rule out PFO. 3.Hx of CHF with AICD -Normal EF and no diastolic dysfunction on most recent echo from November '18 -Appears euvolemic -Diuretics held on admission for BP on low side  4.Hypertension -BP has been on low side, but stable -Hold diuretics initially -Decrease  Inderal to 20 mg daily  5.Hypothyroidism -Continue Synthroid    Discharge Instructions   Allergies as of 03/01/2017  Reactions   Other Nausea And Vomiting, Other (See Comments)   Most narcotic pain meds cause N/V and CHEST PAIN; needs an antiemetic to tolerate Also, patient was diagnosed with Raynaud's disease and was told to NOT place IV(s) in either hand because of poor circulation   Tape Other (See Comments)   PATIENT'S SKIN IS VERY THIN; IT TEARS, BRUISES, AND BLEEDS VERY EASILY (PLEASE USE COBAN WRAP, IF POSSIBLE!!)   Codeine Other (See Comments)   Chest pain   Fentanyl Other (See Comments)   Bad dreams   Morphine And Related Nausea And Vomiting   Robaxin [methocarbamol] Other (See Comments)   Chest pain      Medication List    STOP taking these medications   acetaminophen 650 MG CR tablet Commonly known as:  TYLENOL   furosemide 40 MG tablet Commonly known as:  LASIX   spironolactone 25 MG tablet Commonly known as:  ALDACTONE     TAKE these medications   amitriptyline 25 MG tablet Commonly known as:  ELAVIL Take 45m by mouth every night   B-12 COMPLIANCE INJECTION 1000 MCG/ML Kit Generic drug:  Cyanocobalamin Inject 1,000 mcg every 30 (thirty) days as directed.   baclofen 10 MG tablet Commonly known as:  LIORESAL Take 10 mg by mouth at bedtime.   diclofenac sodium 1 % Gel Commonly known as:  VOLTAREN Apply 2 g topically daily. TO AFFECTED AREAS   diphenhydrAMINE 25 MG tablet Commonly known as:  BENADRYL Take 25 mg at bedtime by mouth.   eletriptan 40 MG tablet Commonly known as:  RELPAX Take 40 mg by mouth daily as needed for migraine. May repeat in 2 hours if necessary   fluorouracil 5 % cream Commonly known as:  EFUDEX Apply to affected areas of the face every other night   fluticasone 50 MCG/ACT nasal spray Commonly known as:  FLONASE Place 1 spray into both nostrils daily as needed for allergies or rhinitis.   gabapentin 600  MG tablet Commonly known as:  NEURONTIN Take 1 tablet (600 mg total) by mouth 3 (three) times daily. What changed:  when to take this   levothyroxine 100 MCG tablet Commonly known as:  SYNTHROID, LEVOTHROID Take 100 mcg daily before breakfast by mouth.   LUBRICATING EYE DROPS OP Place 1 drop into both eyes 2 (two) times daily.   Melatonin 5 MG Tabs Take 5 mg at bedtime by mouth.   omeprazole 20 MG tablet Commonly known as:  PRILOSEC OTC Take 20 mg daily as needed by mouth (acid reflux).   polyethylene glycol packet Commonly known as:  MIRALAX / GLYCOLAX Take 17 g every evening by mouth.   propranolol 60 MG tablet Commonly known as:  INDERAL Take 60 mg by mouth daily.      Follow-up Information    TEvans Lance MD Follow up.   Specialty:  Cardiology Contact information: [address] [phone]        LRikki SpearingP, DO Follow up.   Specialty:  Family Medicine Contact information: [phone]         Allergies  Allergen Reactions  . Other Nausea And Vomiting and Other (See Comments)    Most narcotic pain meds cause N/V and CHEST PAIN; needs an antiemetic to tolerate  Also, patient was diagnosed with Raynaud's disease and was told to NOT place IV(s) in either hand because of poor circulation  .  Tape Other (See Comments)    PATIENT'S SKIN IS VERY THIN; IT TEARS, BRUISES, AND BLEEDS VERY EASILY (PLEASE USE COBAN WRAP, IF POSSIBLE!!)  . Codeine Other (See Comments)    Chest pain  . Fentanyl Other (See Comments)    Bad dreams  . Morphine And Related Nausea And Vomiting  . Robaxin [Methocarbamol] Other (See Comments)    Chest pain    Consultations:  cardiology   Procedures/Studies: Dg Chest 2 View  Result Date: 02/25/2017 CLINICAL DATA:  Pain around pacemaker EXAM: CHEST  2 VIEW COMPARISON:  01/21/2011 FINDINGS: Lungs are hyperexpanded. The lungs are clear without focal  pneumonia, edema, pneumothorax or pleural effusion. Battery pack for left pacer/AICD is exchanged in the interval. Thoracolumbar scoliosis with thoracolumbar fusion hardware again noted. Bones are diffusely demineralized. IMPRESSION: No active cardiopulmonary disease. Electronically Signed   By: Misty Stanley M.D.   On: 02/25/2017 17:38   Ct Abdomen Pelvis W Contrast  Result Date: 02/25/2017 CLINICAL DATA:  Left lower quadrant abdominal pain. Decreased appetite. EXAM: CT ABDOMEN AND PELVIS WITH CONTRAST TECHNIQUE: Multidetector CT imaging of the abdomen and pelvis was performed using the standard protocol following bolus administration of intravenous contrast. CONTRAST:  171m ISOVUE-300 IOPAMIDOL (ISOVUE-300) INJECTION 61% COMPARISON:  None. FINDINGS: Lower chest: Minimal ground-glass in the right base on series 4, image 5 could represent atelectasis or a subtle infectious process. No other changes in the lung bases. Hepatobiliary: Hepatic steatosis. Previous cholecystectomy. Patent portal vein. Pancreas: Unremarkable. No pancreatic ductal dilatation or surrounding inflammatory changes. Spleen: There is a wedge-shaped band of decreased attenuation in the spleen which is a new finding since 2017 consistent with a splenic infarct. The remainder of the spleen is normal. Adrenals/Urinary Tract: Tiny renal lesions are likely cysts but too small to characterize. No ureterectasis, ureteral stone, or bladder abnormality. Adrenal glands are normal. Stomach/Bowel: The stomach and small bowel are normal. Moderate fecal loading throughout much of the colon. The appendix is normal. Vascular/Lymphatic: Mild atherosclerotic change in the abdominal aorta which is nonaneurysmal. No adenopathy. Reproductive: Status post hysterectomy. No adnexal masses. Other: No abdominal wall hernia or abnormality. No abdominopelvic ascites. Musculoskeletal: Postsurgical changes in the spine are stable. IMPRESSION: 1. There is a splenic infarct  which was not seen in August 2017. While age indeterminate, an acute infarct is not excluded. 2. Minimal ground-glass in the right lung base may represent atelectasis or a subtle infectious process. 3. Mild atherosclerotic change in the abdominal aorta. 4. Moderate fecal loading in the colon. Electronically Signed   By: DDorise BullionIII M.D   On: 02/25/2017 19:28    (Echo, Carotid, EGD, Colonoscopy, ERCP)    Subjective:   Discharge Exam: Vitals:   03/01/17 0936 03/01/17 1153  BP: (!) 99/51 (!) 111/49  Pulse: 83 79  Resp: 17 20  Temp: 97.8 F (36.6 C) 97.9 F (36.6 C)  SpO2: 99% 100%   Vitals:   03/01/17 0506 03/01/17 0823 03/01/17 0936 03/01/17 1153  BP: (!) 102/53 (!) 121/39 (!) 99/51 (!) 111/49  Pulse: 78  83 79  Resp: '20 19 17 20  ' Temp: 97.8 F (36.6 C) 98.5 F (36.9 C) 97.8 F (36.6 C) 97.9 F (36.6 C)  TempSrc: Oral Oral Oral Oral  SpO2: 97% 100% 99% 100%  Weight: 74.5 kg (164 lb 4.8 oz) 74.4 kg (164 lb)    Height:  '5\' 7"'  (1.702 m)      General: Pt is alert, awake, not in acute distress Cardiovascular: RRR, S1/S2 +,  no rubs, no gallops Respiratory: CTA bilaterally, no wheezing, no rhonchi Abdominal: Soft, NT, ND, bowel sounds + Extremities: no edema, no cyanosis    The results of significant diagnostics from this hospitalization (including imaging, microbiology, ancillary and laboratory) are listed below for reference.     Microbiology: Recent Results (from the past 240 hour(s))  Blood culture (routine x 2)     Status: None (Preliminary result)   Collection Time: 02/25/17  8:50 PM  Result Value Ref Range Status   Specimen Description   Final    BLOOD RIGHT ANTECUBITAL Performed at [facility], [address] [place], [place] [address]    Special Requests   Final    BOTTLES DRAWN AEROBIC AND ANAEROBIC Blood Culture adequate volume Performed at [facility], [address] [place], [place] [address]    Culture   Final    NO  GROWTH 3 DAYS Performed at [facility], [place] [address] [place], [place] [address]    Report Status PENDING  Incomplete  Blood culture (routine x 2)     Status: None (Preliminary result)   Collection Time: 02/25/17  9:00 PM  Result Value Ref Range Status   Specimen Description   Final    BLOOD LEFT ANTECUBITAL Performed at [facility], [address] [place], [place] [address]    Special Requests   Final    BOTTLES DRAWN AEROBIC AND ANAEROBIC Blood Culture results may not be optimal due to an inadequate volume of blood received in culture bottles Performed at [facility], [address] [place], [place] [address]    Culture   Final    NO GROWTH 3 DAYS Performed at [facility], [place] [address] [place],  [address]    Report Status PENDING  Incomplete     Labs: BNP (last 3 results) Recent Labs    02/25/17 1736  BNP 41.9   Basic Metabolic Panel: Recent Labs  Lab 02/25/17 1736 02/26/17 0733 02/27/17 0619  NA 139 140 140  K 3.8 3.8 3.7  CL 103 104 102  CO2 '27 26 26  ' GLUCOSE 103* 109* 102*  BUN '19 15 17  ' CREATININE 1.01* 0.90 1.00  CALCIUM 9.0 8.7* 9.1   Liver Function Tests: Recent Labs  Lab 02/25/17 1736 02/27/17 0619  AST 31 30  ALT 32 30  ALKPHOS 76 71  BILITOT 0.6 0.5  PROT 7.2 6.1*  ALBUMIN 3.9 3.2*   Recent Labs  Lab 02/25/17 1736  LIPASE 31   No results for input(s): AMMONIA in the last 168 hours. CBC: Recent Labs  Lab 02/25/17 1736 02/26/17 0733 02/27/17 0619  WBC 11.6* 8.9 11.9*  NEUTROABS  --   --  7.9*  HGB 14.9 14.3 13.5  HCT 44.0 43.0 41.4  MCV 91.7 93.5 95.2  PLT 310 281 306   Cardiac Enzymes: Recent Labs  Lab 02/25/17 1736  TROPONINI <0.03   BNP: Invalid input(s): POCBNP CBG: No results for input(s): GLUCAP in the last 168 hours. D-Dimer No results for input(s): DDIMER in the last 72 hours. Hgb A1c No results for input(s): HGBA1C in the last 72 hours. Lipid Profile No  results for input(s): CHOL, HDL, LDLCALC, TRIG, CHOLHDL, LDLDIRECT in the last 72 hours. Thyroid function studies No results for input(s): TSH, T4TOTAL, T3FREE, THYROIDAB in the last 72 hours.  Invalid input(s): FREET3 Anemia work up No results for input(s): VITAMINB12, FOLATE, FERRITIN,  TIBC, IRON, RETICCTPCT in the last 72 hours. Urinalysis    Component Value Date/Time   COLORURINE YELLOW 02/25/2017 1736   APPEARANCEUR CLEAR 02/25/2017 1736   LABSPEC 1.010 02/25/2017 1736   PHURINE 5.5 02/25/2017 1736   GLUCOSEU NEGATIVE 02/25/2017 1736   HGBUR MODERATE (A) 02/25/2017 1736   BILIRUBINUR NEGATIVE 02/25/2017 1736   KETONESUR NEGATIVE 02/25/2017 1736   PROTEINUR NEGATIVE 02/25/2017 1736   UROBILINOGEN 0.2 03/26/2009 0927   NITRITE NEGATIVE 02/25/2017 1736   LEUKOCYTESUR TRACE (A) 02/25/2017 1736   Sepsis Labs Invalid input(s): PROCALCITONIN,  WBC,  LACTICIDVEN Microbiology Recent Results (from the past 240 hour(s))  Blood culture (routine x 2)     Status: None (Preliminary result)   Collection Time: 02/25/17  8:50 PM  Result Value Ref Range Status   Specimen Description   Final    BLOOD RIGHT ANTECUBITAL Performed at [facility], [address] [place], [place] [address]    Special Requests   Final    BOTTLES DRAWN AEROBIC AND ANAEROBIC Blood Culture adequate volume Performed at [facility], [address] [place], [place] [address]    Culture   Final    NO GROWTH 3 DAYS Performed at [facility], [place] [address] [place], [place] [address]    Report Status PENDING  Incomplete  Blood culture (routine x 2)     Status: None (Preliminary result)   Collection Time: 02/25/17  9:00 PM  Result Value Ref Range Status   Specimen Description   Final    BLOOD LEFT ANTECUBITAL Performed at [facility], [address] [place], [place] [address]    Special Requests   Final    BOTTLES DRAWN AEROBIC AND ANAEROBIC Blood Culture results may  not be optimal due to an inadequate volume of blood received in culture bottles Performed at [facility], [address] [place], [place] [address]    Culture   Final    NO GROWTH 3 DAYS Performed at [facility], [place] [address], [place], [place] [address]    Report Status PENDING  Incomplete     Time coordinating discharge: Over 30 minutes  SIGNED:   Georgette Shell, MD  [facility] 03/01/2017, 12:25 PM Pager   If 7PM-7AM, please contact night-coverage [email or web address] Password [email or web address]

## 2017-03-01 NOTE — Progress Notes (Signed)
Bilateral lower extremity venous duplex completed. No evidence of DVT, superficial thrombosis, or Baker's cyst. Toma Copier, RVS 03/01/2017 5:00 PM

## 2017-03-01 NOTE — Anesthesia Procedure Notes (Signed)
Procedure Name: MAC Date/Time: 03/01/2017 9:05 AM Performed by: Jenne Campus, CRNA Pre-anesthesia Checklist: Patient identified, Emergency Drugs available, Suction available, Patient being monitored and Timeout performed Oxygen Delivery Method: Nasal cannula

## 2017-03-02 ENCOUNTER — Encounter: Payer: Self-pay | Admitting: Cardiology

## 2017-03-02 ENCOUNTER — Ambulatory Visit (INDEPENDENT_AMBULATORY_CARE_PROVIDER_SITE_OTHER): Payer: Self-pay | Admitting: *Deleted

## 2017-03-02 ENCOUNTER — Encounter: Payer: Medicare Other | Admitting: Internal Medicine

## 2017-03-02 DIAGNOSIS — I428 Other cardiomyopathies: Secondary | ICD-10-CM

## 2017-03-02 LAB — CULTURE, BLOOD (ROUTINE X 2)
Culture: NO GROWTH
Culture: NO GROWTH
Special Requests: ADEQUATE

## 2017-03-02 NOTE — Anesthesia Postprocedure Evaluation (Signed)
Anesthesia Post Note  Patient: [name]  Procedure(s) Performed: TRANSESOPHAGEAL ECHOCARDIOGRAM (TEE) (N/A )     Patient location during evaluation: PACU Anesthesia Type: MAC Level of consciousness: awake and alert Pain management: pain level controlled Vital Signs Assessment: post-procedure vital signs reviewed and stable Respiratory status: spontaneous breathing, nonlabored ventilation, respiratory function stable and patient connected to nasal cannula oxygen Cardiovascular status: stable and blood pressure returned to baseline Postop Assessment: no apparent nausea or vomiting Anesthetic complications: no    Last Vitals:  Vitals:   03/01/17 0936 03/01/17 1153  BP: (!) 99/51 (!) 111/49  Pulse: 83 79  Resp: 17 20  Temp: 36.6 C 36.6 C  SpO2: 99% 100%    Last Pain:  Vitals:   03/01/17 1505  TempSrc:   PainSc: 0-No pain                 Antoin Dargis S

## 2017-03-02 NOTE — Progress Notes (Signed)
Remote pacemaker transmission.   

## 2017-03-03 LAB — FACTOR 5 LEIDEN

## 2017-03-06 LAB — CUP PACEART REMOTE DEVICE CHECK
Battery Remaining Longevity: 137 mo
Battery Voltage: 3.14 V
Brady Statistic AP VP Percent: 0.04 %
Brady Statistic AP VS Percent: 0.01 %
Brady Statistic AS VP Percent: 98.57 %
Brady Statistic AS VS Percent: 1.37 %
Brady Statistic RA Percent Paced: 0.06 %
Brady Statistic RV Percent Paced: 3.93 %
Date Time Interrogation Session: 20190221024110
Implantable Lead Implant Date: 20130110
Implantable Lead Implant Date: 20130110
Implantable Lead Implant Date: 20130110
Implantable Lead Location: 753858
Implantable Lead Location: 753859
Implantable Lead Location: 753860
Implantable Lead Model: 4196
Implantable Lead Model: 5076
Implantable Lead Model: 6935
Implantable Pulse Generator Implant Date: 20181115
Lead Channel Impedance Value: 1140 Ohm
Lead Channel Impedance Value: 342 Ohm
Lead Channel Impedance Value: 437 Ohm
Lead Channel Impedance Value: 513 Ohm
Lead Channel Impedance Value: 551 Ohm
Lead Channel Impedance Value: 570 Ohm
Lead Channel Impedance Value: 646 Ohm
Lead Channel Impedance Value: 722 Ohm
Lead Channel Impedance Value: 836 Ohm
Lead Channel Pacing Threshold Amplitude: 0.625 V
Lead Channel Pacing Threshold Amplitude: 1.25 V
Lead Channel Pacing Threshold Amplitude: 2.375 V
Lead Channel Pacing Threshold Pulse Width: 0.4 ms
Lead Channel Pacing Threshold Pulse Width: 0.4 ms
Lead Channel Pacing Threshold Pulse Width: 0.8 ms
Lead Channel Sensing Intrinsic Amplitude: 0.75 mV
Lead Channel Sensing Intrinsic Amplitude: 0.75 mV
Lead Channel Sensing Intrinsic Amplitude: 14.25 mV
Lead Channel Sensing Intrinsic Amplitude: 14.25 mV
Lead Channel Setting Pacing Amplitude: 1.75 V
Lead Channel Setting Pacing Amplitude: 2 V
Lead Channel Setting Pacing Amplitude: 5 V
Lead Channel Setting Pacing Pulse Width: 0.8 ms
Lead Channel Setting Pacing Pulse Width: 1 ms
Lead Channel Setting Sensing Sensitivity: 0.9 mV

## 2017-03-06 LAB — PROTHROMBIN GENE MUTATION

## 2017-03-10 ENCOUNTER — Encounter: Payer: Self-pay | Admitting: Cardiology

## 2017-03-21 NOTE — Progress Notes (Signed)
Cardiology Office Note Date:  03/23/2017  Patient ID:  [name], DOB February 19, 1955, MRN [id number] PCP:  Glenford Bayley, DO  Cardiologist:  Dr. Wynonia Lawman Electrophysiologist: Dr. Lovena Le    Chief Complaint: post hospital  History of Present Illness: [name] is a 62 y.o. female with history of NICM w/CRT-D >> CRT-P with normalization of EF, chronic CHF (systolic), LBBB, GERD, HTN, thyroid disease, essential tremor, Reynaud's disease, scoliosis, there has been question of hx of AFib back in 2010 pehaps occured peri-operatively, never started on a/c.   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  Her device was interrogated noting 1-2 minute episode of AFib, this very uncertain if such short AF episode could have caused an embolic event, she underwent TEE without thrombus and EF remained preserved.  Report noted, "patent foramen ovale is likely. There was no right to left flow at rest but saline microcavitation study was positive with abdominal pressure.  The test was repeated to ensure that there was no extracardiac shunt and was negative."  LE venous dopplers were negative for DVT.    She saw Dr. Charm Rings last week and was resumed on diuretic w/ aldactone 12,.59m BID and started on Eliquis 550mBID.  She has noted a small bruise on her R hand that she knows she bumped at the gym.  No bleeding or signs of bleeding otherwise.  She reports a little ankle edema though has since been started on the aldactone.  Otherwise no cardiac concerns.  No CP or SOB, no near syncope or syncope.  She remains with persistent L flank/side discomfort, saw GI last week as well.  She reports discussed this, though unclear as to what her expectations should be in regards to this.  She has not been back to the gym since her hospital stay, though prior that was routinely doing  water aerobics with good exertional capacity.  Device information: MDT, CRT-P, 11/24/16 (original device (leads)) was a CRT-D implanted 2013  Past Medical History:  Diagnosis Date  . AF (paroxysmal atrial fibrillation) ([facility]  . Arthritis   . Biventricular cardiac pacemaker in situ    a. prior CRT-D in 2013 with downgrade to CRT-Pacemaker only in 11/2016.  . Marland Kitchenhronic systolic CHF (congestive heart failure) ([facility]  . Complication of anesthesia    aspiration  . Essential tremor   . Fibroids   . GERD (gastroesophageal reflux disease)    otc  . Hypertension   . Hyperthyroidism   . Hypothyroidism   . LBBB (left bundle branch block)    conduction disorder  . Migraines   . Mild aortic insufficiency   . NICM (nonischemic cardiomyopathy) ([facility]   a. 2012: normal coronaries, EF 25-30%. b. improved to 55% 11/2016.  . Osteoporosis   . Pleural effusion, right    thoracentesis 02/09/09   . Raynaud's disease   . Scoliosis   . 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 ARTERY BIOPSY  12/29/2011   Procedure: MINOR BIOPSY TEMPORAL ARTERY;  Surgeon: BeEdward JollyMD;  [place] Service: General;  Laterality: Right;  Right temporal artery biopsy  . BACK SURGERY  12/31/2009   "for flat back syndrome; fused bottom of my back"  . BI-VENTRICULAR IMPLANTABLE  CARDIOVERTER DEFIBRILLATOR Left 01/20/2011   Procedure: BI-VENTRICULAR IMPLANTABLE CARDIOVERTER DEFIBRILLATOR  (CRT-D);  Surgeon: Evans Lance, MD;  [facility];  Service: Cardiovascular;  Laterality: Left;  . BIV ICD GENERATOR CHANGEOUT N/A 11/24/2016   Procedure: BIV ICD GENERATOR CHANGEOUT;  Surgeon: Evans Lance, MD;  Location: [place] CV LAB;  Service: Cardiovascular;  Laterality: N/A;  . CHOLECYSTECTOMY  ~1996  . FOOT NEUROMA SURGERY  ~ 2003   right  . ROTATOR CUFF REPAIR  ~ 2009   left  . SHOULDER  ARTHROSCOPY W/ ROTATOR CUFF REPAIR  09/21/2011  . SKIN CANCER EXCISION     nose, forehead, left leg  . TEE WITHOUT CARDIOVERSION N/A 03/01/2017   Procedure: TRANSESOPHAGEAL ECHOCARDIOGRAM (TEE);  Surgeon: Skeet Latch, MD;  Location: [place];  Service: Cardiovascular;  Laterality: N/A;  . THYROID LOBECTOMY  ~ 2006   right  . TUBAL LIGATION  ~ 1983  . VAGINAL HYSTERECTOMY  ~ 2009  . VESICOVAGINAL FISTULA CLOSURE W/ TAH      Current Outpatient Medications  Medication Sig Dispense Refill  . amitriptyline (ELAVIL) 25 MG tablet Take 35m by mouth every night  5  . baclofen (LIORESAL) 10 MG tablet Take 10 mg by mouth at bedtime.  1  . Carboxymethylcellul-Glycerin (LUBRICATING EYE DROPS OP) Place 1 drop into both eyes 2 (two) times daily.     . Cyanocobalamin (B-12 COMPLIANCE INJECTION) 1000 MCG/ML KIT Inject 1,000 mcg every 30 (thirty) days as directed.    . diclofenac sodium (VOLTAREN) 1 % GEL Apply 2 g topically daily. TO AFFECTED AREAS  3  . diphenhydrAMINE (BENADRYL) 25 MG tablet Take 25 mg at bedtime by mouth.    . eletriptan (RELPAX) 40 MG tablet Take 40 mg by mouth daily as needed for migraine. May repeat in 2 hours if necessary    . fluorouracil (EFUDEX) 5 % cream Apply to affected areas of the face every other night    . fluticasone (FLONASE) 50 MCG/ACT nasal spray Place 1 spray into both nostrils daily as needed for allergies or rhinitis.    .Marland Kitchengabapentin (NEURONTIN) 600 MG tablet Take 1 tablet (600 mg total) by mouth 3 (three) times daily. (Patient taking differently: Take 600 mg 2 (two) times daily by mouth. ) 270 tablet 4  . levothyroxine (SYNTHROID, LEVOTHROID) 100 MCG tablet Take 100 mcg daily before breakfast by mouth.    . Melatonin 5 MG TABS Take 5 mg at bedtime by mouth.    .Marland Kitchenomeprazole (PRILOSEC OTC) 20 MG tablet Take 20 mg daily as needed by mouth (acid reflux).    . polyethylene glycol (MIRALAX / GLYCOLAX) packet Take 17 g every evening by mouth.    . propranolol  (INDERAL) 60 MG tablet Take 60 mg by mouth daily.     No current facility-administered medications for this visit.     Allergies:   Other; Tape; Codeine; Fentanyl; Morphine and related; and Robaxin [methocarbamol]   Social History:  The patient  reports that  has never smoked. she has never used smokeless tobacco. She reports that she does not drink alcohol or use drugs.   Family History:  The patient's family history includes Atrial fibrillation in her sister; Heart disease in her mother, sister, and sister; Heart disease (age of onset: 922 in her father; Heart failure (age of onset: 871 in her mother; Hypertension in her mother; Macular degeneration in her father; Stroke in her mother.  ROS:  Please see the history of present  illness.  All other systems are reviewed and otherwise negative.   PHYSICAL EXAM:  VS:  Ht '5\' 7"'  (1.702 m)   Wt 167 lb 3.2 oz (75.8 kg)   BMI 26.19 kg/m  BMI: Body mass index is 26.19 kg/m. Well nourished, well developed, in no acute distress  HEENT: normocephalic, atraumatic  Neck: no JVD, carotid bruits or masses Cardiac:  RRR; no significant murmurs, no rubs, or gallops Lungs:  CTA b/l, no wheezing, rhonchi or rales  Abd: soft, nontender MS: no deformity or atrophy Ext: trace edema  Skin: warm and dry, no rash Neuro:  No gross deficits appreciated Psych: euthymic mood, full affect  ICD site is stable, no tethering or discomfort   EKG: not done today Device interrogation done today and reviewed by myself: battery is stable, RA/LV lead measurements are good and c/w previous.  RV sensing/impedence ar stable, RV Thresholds up from October (new generator), though not new since her last programmer check (in-hospital).  She is 98.8% LV only pacing, only 1% BiVe.  OptiVol is high, no arrhythmias. RV pacing output was reprogrammed to fixed output 4V/1.66m  03/01/17: TEE Study Conclusions - Left ventricle: Systolic function was normal. Wall motion was    normal; there were no regional wall motion abnormalities. - Aortic valve: There was mild regurgitation. - Mitral valve: There was trivial regurgitation. - Left atrium: No evidence of thrombus in the atrial cavity or   appendage. No evidence of thrombus in the atrial cavity or   appendage. - Right ventricle: The cavity size was normal. Wall thickness was   normal. Systolic function was normal. - Right atrium: No evidence of thrombus in the atrial cavity or   appendage. - Atrial septum: A patent foramen ovale is likely. There was no   right to left flow at rest but saline microcavitation study was   positive with abdominal pressure. The test was repeated to ensure   that there was no extracardiac shunt and was negative. - Tricuspid valve: There was trivial regurgitation.  Recent Labs: 02/25/2017: B Natriuretic Peptide 18.5 02/27/2017: ALT 30; BUN 17; Creatinine, Ser 1.00; Hemoglobin 13.5; Platelets 306; Potassium 3.7; Sodium 140  No results found for requested labs within last 8760 hours.   CrCl cannot be calculated (Patient's most recent lab result is older than the maximum 21 days allowed.).   Wt Readings from Last 3 Encounters:  03/23/17 167 lb 3.2 oz (75.8 kg)  03/01/17 164 lb (74.4 kg)  12/22/16 161 lb (73 kg)     Other studies reviewed: Additional studies/records reviewed today include: summarized above  ASSESSMENT AND PLAN:  1. CRT-P     Findings as noted above, RV thresholds up, sensing/impedance are stable, reprogrammed as noted     1% RV pacing  2. HTN     Relative hypotension, looks OK today  3. Splenic infarct, 02/2017     Device check in-hospital with 1-217m of AF only noted     She was not started on a/c at her hospital stay     In f/u with Dr. TiWynonia Lawmanast week started on Eliquis  4. NICM w/improved LVEF     Fluid OL noted by device, started on aldactone last week with Dr. TiWynonia LawmanI will make Dr. TaLovena Leware of device finidngs, plan continued 3 month remotes  and 1 year in-clinic otherwise, sooner if needed once reviewed by Dr. TaLovena Le She will c/w Dr. TiWynonia Lawmanor cardiac care/management  Recommend she call GI MD to  get a better understanding of her L flank discomfort and expected course.    Disposition: as above    Current medicines are reviewed at length with the patient today.  The patient did not have any concerns regarding medicines.  Venetia Night, PA-C 03/23/2017 8:57 AM     [address] [place] [address] [phone] (office)  [phone] (fax)

## 2017-03-23 ENCOUNTER — Ambulatory Visit: Payer: Medicare Other | Admitting: Physician Assistant

## 2017-03-23 ENCOUNTER — Encounter: Payer: Self-pay | Admitting: Physician Assistant

## 2017-03-23 VITALS — BP 90/64 | HR 69 | Ht 67.0 in | Wt 167.2 lb

## 2017-03-23 DIAGNOSIS — I480 Paroxysmal atrial fibrillation: Secondary | ICD-10-CM | POA: Diagnosis not present

## 2017-03-23 DIAGNOSIS — I428 Other cardiomyopathies: Secondary | ICD-10-CM | POA: Diagnosis not present

## 2017-03-23 DIAGNOSIS — I10 Essential (primary) hypertension: Secondary | ICD-10-CM | POA: Diagnosis not present

## 2017-03-23 DIAGNOSIS — Z950 Presence of cardiac pacemaker: Secondary | ICD-10-CM | POA: Diagnosis not present

## 2017-03-23 NOTE — Patient Instructions (Addendum)
Medication Instructions:    Your physician recommends that you schedule a follow-up appointment in:    If you need a refill on your cardiac medications before your next appointment, please call your pharmacy.  Labwork: NONE ORDERED  TODAY    Testing/Procedures: NONE ORDERED  TODAY    Follow-Up:  Your physician wants you to follow-up in: [place] will receive a reminder letter in the mail two months in advance. If you don't receive a letter, please call our office to schedule the follow-up appointment.   Remote monitoring is used to monitor your Pacemaker of ICD from home. This monitoring reduces the number of office visits required to check your device to one time per year. It allows [place] to keep an eye on the functioning of your device to ensure it is working properly. You are scheduled for a device check from home on .  06-22-17  You may send your transmission at any time that day. If you have a wireless device, the transmission will be sent automatically. After your physician reviews your transmission, you will receive a postcard with your next transmission date.     Any Other Special Instructions Will Be Listed Below (If Applicable).

## 2017-06-22 ENCOUNTER — Encounter: Payer: Medicare Other | Admitting: *Deleted

## 2017-10-12 DIAGNOSIS — R300 Dysuria: Secondary | ICD-10-CM | POA: Diagnosis not present

## 2017-10-23 ENCOUNTER — Encounter: Payer: Self-pay | Admitting: Cardiology

## 2017-10-23 DIAGNOSIS — Z7901 Long term (current) use of anticoagulants: Secondary | ICD-10-CM | POA: Insufficient documentation

## 2017-10-23 NOTE — Progress Notes (Signed)
[name] F  Date of visit:  03/10/2017 DOB:  1955/12/19    Age:  62 yrs. Medical record number:  [id number]     Account number:  [id number] Primary Care Provider: [facility] ____________________________ CURRENT DIAGNOSES  1. Cardiomyopathy, unspecified  2. Left bundle-branch block  3. Presence of cardiac pacemaker  4. Nonrheumatic aortic valve disorder, unspecified  5. Long term (current) use of anticoagulants  6. Hypothyroidism  7. Infarction Of Spleen  8. Paroxysmal atrial fibrillation ____________________________ ALLERGIES  Codeine, Intolerance-unknown  Fentanyl, Intolerance-unknown  Morphine, Vomiting ____________________________ MEDICATIONS  1. acetaminophen 325 mg capsule, PRN  2. amitriptyline 25 mg tablet, 1 1/2  to 2 tablets at HS  3. baclofen 10 mg tablet, 1 tablet Q 8 hrs PRN  4. Benadryl Allergy 25 mg tablet, QHS  5. Calcium 500 500 mg calcium (1,250 mg) tablet, BID  6. cyanocobalamin (vit B-12) 1,000 mcg/mL injection solution, Q 4 weeks  7. Eliquis 5 mg tablet, BID  8. fexofenadine 180 mg tablet, 1 p.o. daily PRN  9. gabapentin 600 mg tablet, TID  10. levothyroxine 100 mcg tablet, 1 p.o. daily  11. Metamucil (sugar) oral powder, PRN  12. mupirocin 2 % topical ointment, Take as directed  13. Premarin 0.625 mg/gram vaginal cream, PRN  14. propranolol 60 mg tablet, 1 p.o. daily  15. Relpax 40 mg tablet, PRN  16. spironolactone 25 mg tablet, 1/2 tab daily  17. Vitamin D3 1,000 unit tablet, BID  18. Voltaren 1 % topical gel, Take as directed ____________________________ HISTORY OF PRESENT ILLNESS Patient returns for cardiac followup. Since she was previously here she underwent upgrade to a biventricular pacemaker since her EF improved. She recently was hospitalized with abdominal pain and was found to have a splenic infarct. While in the hospital she eventually had a TEE that showed a very small patent foramen ovale with abdominal correction her  she did not have any intracardiac thrombus and did not have DVT. She was discharged on no anticoagulation and there has been a question about whether to have anticoagulation or not. She has had some vague episodes of paroxysmal atrial fibrillation but the electrophysiology doctor did not feel it was enough to account for presentation of thrombus. She is anxious about having another clot. She denies angina or shortness of breath and has no PND or orthopnea. 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 Lovena Le January/2013;  Cardiology Procedures-Noninvasive:  echocardiogram August 2017, TEE February 2019;  Cardiac Cath Results:  normal coronary arteries;  LVEF of 60% documented via echocardiogram on 03/01/2017,   ____________________________ CARDIO-PULMONARY TEST DATES EKG Date:  09/06/2016;   Cardiac Cath Date:  02/11/2010;  Nuclear Study Date:  10/02/2002;  Echocardiography Date: 03/01/2017;  Chest Xray Date: 02/25/2017;  CT Scan Date:  02/08/2010   ____________________________ FAMILY HISTORY Father -- Father dead, Death due to natural cause Mother -- Mother dead, CVA Sister -- Sister alive with problem, Atrial fibrillation, Coronary Artery Disease Sister -- Sister alive and well Sister -- Sister alive and well Sister -- Sister  alive and  well ____________________________ SOCIAL HISTORY Alcohol Use:  does not use alcohol;  Smoking:  does not smoke;  Diet:  regular diet;  Lifestyle:  married;  Exercise:  exercises regularly;  Occupation:  1[employer] and [employer];  Residence:  lives with husband;   ____________________________ REVIEW OF SYSTEMS General:  feels well, no change in exercise tolerance., malaise and fatigue  Integumentary:rosacea, easy bruising Eyes: wears eye glasses/contact lenses Respiratory: denies dyspnea, cough, wheezing or hemoptysis. Cardiovascular:  please review HPI Abdominal: denies dyspepsia, GI bleeding, constipation, or diarrhea Musculoskeletal:  mild chronic back pain,  Neurological:  severe migraine headaches around once /month, peripheral neuropathy  ____________________________ PHYSICAL EXAMINATION VITAL SIGNS  Blood Pressure:  94/64 Sitting, Left arm, regular cuff  , 90/70 Standing, Left arm and regular cuff   Pulse:  60/min. Weight:  166.00 lbs. Height:  67.00"BMI: 26  Constitutional:  pleasant white female, in no acute distress Skin:  warm and dry to touch, no apparent skin lesions, or masses noted. Head:  normocephalic, normal hair pattern, no masses or tenderness Neck:  supple, no masses, thyromegaly, JVD. Carotid pulses are full and equal bilaterally without bruits. Chest:  clear to auscultation, healed ICD incision in the left pectoral area Cardiac:  regular rhythm, normal S1 and S2, No S3 or S4, no murmurs, gallops or rubs detected. Peripheral Pulses:  the femoral,dorsalis pedis, and posterior tibial pulses are full and equal bilaterally with no bruits auscultated. Extremities & Back:  Prior scar from back surgery Neurological:  mild tremor of head ____________________________ MOST RECENT LIPID PANEL 03/22/16  CHOL TOTL 190 mg/dl, LDL 111 NM, HDL 60 mg/dl, TRIGLYCER 95 mg/dl and CHOL/HDL 3.2 (Calc) ____________________________ IMPRESSIONS/PLAN 1. Recent splenic infarct of  uncertain etiology-it was very difficult to determine whether or not to anticoagulate her. She does have paroxysmal atrial fibrillation has a CHA2DS2VASC score of 3. We discussed all of her hospitalization and pros and cons of anticoagulation including risks of bleeding versus progression of future splenic infarcts or other emboli. At the end of the day she felt that she would have piece of mind she was anticoagulated so we started Eliquis 5 mg twice daily  2. History of cardiomyopathy resolved  3. Functioning biventricular pacemaker 4. Aortic regurgitation  Recommendations:  Anticoagulation education given discussion of Eliquis. Followup in 6 months continue remote pacemaker monitoring. Will restart spironolactone. ____________________________ TODAYS ORDERS  1. Return Visit: 6 months                       ____________________________ Cardiology Physician:  Kerry Hough MD [facility]

## 2017-11-09 ENCOUNTER — Telehealth: Payer: Self-pay

## 2017-11-09 NOTE — Telephone Encounter (Signed)
Called patient to complete annual [employer] survey.  

## 2017-11-13 ENCOUNTER — Encounter: Payer: Self-pay | Admitting: Cardiology

## 2017-11-13 ENCOUNTER — Ambulatory Visit: Payer: Medicare Other | Admitting: Cardiology

## 2017-11-13 VITALS — BP 100/70 | HR 58 | Ht 67.5 in | Wt 165.2 lb

## 2017-11-13 DIAGNOSIS — I10 Essential (primary) hypertension: Secondary | ICD-10-CM

## 2017-11-13 DIAGNOSIS — I480 Paroxysmal atrial fibrillation: Secondary | ICD-10-CM | POA: Diagnosis not present

## 2017-11-13 DIAGNOSIS — Z7901 Long term (current) use of anticoagulants: Secondary | ICD-10-CM

## 2017-11-13 DIAGNOSIS — I428 Other cardiomyopathies: Secondary | ICD-10-CM | POA: Diagnosis not present

## 2017-11-13 DIAGNOSIS — D735 Infarction of spleen: Secondary | ICD-10-CM

## 2017-11-13 NOTE — Patient Instructions (Signed)
Medication Instructions:  Your Physician recommend you continue on your current medication as directed.    If you need a refill on your cardiac medications before your next appointment, please call your pharmacy.   Lab work: None   Testing/Procedures: None  Follow-Up: At [facility], you and your health needs are our priority.  As part of our continuing mission to provide you with exceptional heart care, we have created designated Provider Care Teams.  These Care Teams include your primary Cardiologist (physician) and Advanced Practice Providers (APPs -  Physician Assistants and Nurse Practitioners) who all work together to provide you with the care you need, when you need it. You will need a follow up appointment in 6 months.  Please call our office 2 months in advance to schedule this appointment.  You may see Dr. Christopher or one of the following Advanced Practice Providers on your designated Care Team:   Rhonda Barrett, PA-C . Kathryn Lawrence, DNP, ANP  Any Other Special Instructions Will Be Listed Below (If Applicable).    

## 2017-11-13 NOTE — Progress Notes (Signed)
Cardiology Office Note:    Date:  11/13/2017   ID:  Moana DANETTA PROM, DOB 1955-12-10, MRN [id number]  PCP:  Glenford Bayley, DO  Cardiologist:  Buford Dresser, MD PhD (Dr. Wynonia Lawman) EP: Dr. Lovena Le  Referring MD: Glenford Bayley, DO   CC: "make sure heart and pacemaker are ok"  History of Present Illness:    Telissa F Defoor is a 62 y.o. female with a hx of LBBB, NICM s/p CRT-D-->P with normalization of EF,  who is seen in follow up (new patient to me, follows with Dr. Wynonia Lawman) for the evaluation and management of anticoagulation and history of NICM. She was found to have splenic infarct in 02/2017; only noted to have 1-2 min of afib on her device. TEE noted possible PFO. Dr. Wynonia Lawman started her on apixaban. He also started spironolactone at last visit for elevated Optivol readings.  Patient concerns: wants to make sure heart and pacemaker are doing ok. Worried that she might have gained weight. Noted that O2 level was low on pulse ox at her recent PCP visit. (Checked in office today, 99%).   No chest pain, syncope, shortness of breath at rest, PND, orthopnea, palpitations. Does have mild DOE with climbing stairs, walking up a hill. Pushes her dogs in a stroller, goes to water aerobics when she doesn't have a UTI, tolerates these activities well. Has had mild bruising but no other major issues with anticoagulation.   We reviewed medications, diet/lifestyle recommendations, and her cardiac history in depth today.   Past Medical History:  Diagnosis Date  . AF (paroxysmal atrial fibrillation) ([place])   . Arthritis   . Biventricular cardiac pacemaker in situ    a. prior CRT-D in 2013 with downgrade to CRT-Pacemaker only in 11/2016.  Marland Kitchen Chronic systolic CHF (congestive heart failure) ([place])   . Complication of anesthesia    aspiration  . Essential tremor   . Fibroids   . GERD (gastroesophageal reflux disease)    otc  . Hypertension   . Hyperthyroidism   . Hypothyroidism   . LBBB (left bundle  branch block)    conduction disorder  . Migraines   . Mild aortic insufficiency   . NICM (nonischemic cardiomyopathy) ([place])    a. 2012: normal coronaries, EF 25-30%. b. improved to 55% 11/2016.  . Osteoporosis   . Pleural effusion, right    thoracentesis 02/09/09   . Raynaud's disease   . Scoliosis   . 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 ARTERY BIOPSY  12/29/2011   Procedure: MINOR BIOPSY TEMPORAL ARTERY;  Surgeon: Edward Jolly, MD;  Location: [facility];  Service: General;  Laterality: Right;  Right temporal artery biopsy  . BACK SURGERY  12/31/2009   "for flat back syndrome; fused bottom of my back"  . BI-VENTRICULAR IMPLANTABLE CARDIOVERTER DEFIBRILLATOR Left 01/20/2011   Procedure: BI-VENTRICULAR IMPLANTABLE CARDIOVERTER DEFIBRILLATOR  (CRT-D);  Surgeon: Evans Lance, MD;  Location: [facility] CATH LAB;  Service: Cardiovascular;  Laterality: Left;  . BIV ICD GENERATOR CHANGEOUT N/A 11/24/2016   Procedure: BIV ICD GENERATOR CHANGEOUT;  Surgeon: Evans Lance, MD;  Location: [facility] CV LAB;  Service: Cardiovascular;  Laterality: N/A;  . CHOLECYSTECTOMY  ~1996  . FOOT NEUROMA SURGERY  ~ 2003   right  . ROTATOR CUFF REPAIR  ~ 2009   left  . SHOULDER ARTHROSCOPY W/ ROTATOR CUFF REPAIR  09/21/2011  . SKIN CANCER EXCISION     nose, forehead, left leg  . TEE WITHOUT CARDIOVERSION N/A 03/01/2017   Procedure: TRANSESOPHAGEAL ECHOCARDIOGRAM (TEE);  Surgeon: Skeet Latch, MD;  Location: [facility];  Service: Cardiovascular;  Laterality: N/A;  . THYROID LOBECTOMY  ~ 2006   right  . TUBAL LIGATION  ~ 1983  . VAGINAL HYSTERECTOMY  ~ 2009  . VESICOVAGINAL FISTULA CLOSURE W/ TAH      Current Medications: Current Outpatient Medications on File Prior to Visit  Medication Sig  . amitriptyline (ELAVIL) 25 MG tablet Take 68m by mouth every night  . baclofen (LIORESAL) 10 MG tablet  Take 10 mg by mouth at bedtime.  . Carboxymethylcellul-Glycerin (LUBRICATING EYE DROPS OP) Place 1 drop into both eyes 2 (two) times daily.   . Cyanocobalamin (B-12 COMPLIANCE INJECTION) 1000 MCG/ML KIT Inject 1,000 mcg every 30 (thirty) days as directed.  . diclofenac sodium (VOLTAREN) 1 % GEL Apply 2 g topically daily. TO AFFECTED AREAS  . eletriptan (RELPAX) 40 MG tablet Take 40 mg by mouth daily as needed for migraine. May repeat in 2 hours if necessary  . ELIQUIS 5 MG TABS tablet Take 5 mg by mouth 2 (two) times daily.  . fluticasone (FLONASE) 50 MCG/ACT nasal spray Place 1 spray into both nostrils daily as needed for allergies or rhinitis.  .Marland Kitchengabapentin (NEURONTIN) 600 MG tablet Take 1 tablet (600 mg total) by mouth 3 (three) times daily. (Patient taking differently: Take 600 mg 2 (two) times daily by mouth. )  . levothyroxine (SYNTHROID, LEVOTHROID) 100 MCG tablet Take 100 mcg daily before breakfast by mouth.  . Melatonin 5 MG TABS Take 5 mg at bedtime by mouth.  . methocarbamol (ROBAXIN) 500 MG tablet Take 500 mg by mouth every 8 (eight) hours as needed.  . nitrofurantoin, macrocrystal-monohydrate, (MACROBID) 100 MG capsule   . polyethylene glycol (MIRALAX / GLYCOLAX) packet Take 17 g every evening by mouth.  . propranolol (INDERAL) 60 MG tablet Take 60 mg by mouth daily.  .Marland Kitchenspironolactone (ALDACTONE) 25 MG tablet Take 12.5 mg by mouth 2 (two) times daily.  . diphenhydrAMINE (BENADRYL) 25 MG tablet Take 25 mg at bedtime by mouth.  . fluorouracil (EFUDEX) 5 % cream Apply to affected areas of the face every other night  . omeprazole (PRILOSEC OTC) 20 MG tablet Take 20 mg daily as needed by mouth (acid reflux).   No current facility-administered medications on file prior to visit.      Allergies:   Other; Tape; Codeine; Fentanyl; Morphine and related; and Robaxin [methocarbamol]   Social History   Socioeconomic History  . Marital status: Married    Spouse name: MRonalee Belts . Number of  children: 1  . Years of education: MA  . Highest education level: Not on file  Occupational History  . Occupation: TPharmacist, hospital   Comment: n/a  Social Needs  . Financial resource strain: Not on file  . Food insecurity:    Worry: Not on file    Inability: Not on file  . Transportation needs:    Medical: Not on file    Non-medical: Not on file  Tobacco Use  . Smoking status: Never Smoker  . Smokeless tobacco: Never Used  Substance and Sexual Activity  . Alcohol use: No  . Drug use: No  . Sexual activity: Yes  Lifestyle  . Physical activity:    Days per week: Not on file    Minutes per session: Not on file  .  Stress: Not on file  Relationships  . Social connections:    Talks on phone: Not on file    Gets together: Not on file    Attends religious service: Not on file    Active member of club or organization: Not on file    Attends meetings of clubs or organizations: Not on file    Relationship status: Not on file  Other Topics Concern  . Not on file  Social History Narrative   Patient lives at home with family.   Caffeine Use: 1-2 cups daily     Family History: The patient's family history includes Atrial fibrillation in her sister; Heart disease in her mother, sister, and sister; Heart disease (age of onset: 53) in her father; Heart failure (age of onset: 55) in her mother; Hypertension in her mother; Macular degeneration in her father; Stroke in her mother.  ROS:   Please see the history of present illness.  Additional pertinent ROS:  Constitutional: Negative for chills, fever, night sweats, unintentional weight loss  HENT: Negative for ear pain and hearing loss.   Eyes: Negative for loss of vision and eye pain.  Respiratory: Negative for cough, sputum, shortness of breath, wheezing.   Cardiovascular: Negative for chest pain, palpitations , PND, orthopnea, lower extremity edema and claudication.  Gastrointestinal: Negative for abdominal pain, melena, and hematochezia.    Genitourinary: Negative for dysuria and hematuria.  Musculoskeletal: Negative for falls and myalgias.  Skin: Negative for itching and rash.  Neurological: Negative for focal weakness, focal sensory changes and loss of consciousness.  Endo/Heme/Allergies: Does not bruise/bleed easily.    EKGs/Labs/Other Studies Reviewed:    The following studies were reviewed today: TEE 02/16/17 - Left ventricle: Systolic function was normal. Wall motion was   normal; there were no regional wall motion abnormalities. - Aortic valve: There was mild regurgitation. - Mitral valve: There was trivial regurgitation. - Left atrium: No evidence of thrombus in the atrial cavity or   appendage. No evidence of thrombus in the atrial cavity or   appendage. - Right ventricle: The cavity size was normal. Wall thickness was   normal. Systolic function was normal. - Right atrium: No evidence of thrombus in the atrial cavity or   appendage. - Atrial septum: A patent foramen ovale is likely. There was no   right to left flow at rest but saline microcavitation study was   positive with abdominal pressure. The test was repeated to ensure   that there was no extracardiac shunt and was negative. - Tricuspid valve: There was trivial regurgitation.  TTE Feb 16, 2017 Study Conclusions  - Left ventricle: The cavity size was normal. Wall thickness was   normal. Systolic function was normal. The estimated ejection   fraction was in the range of 60% to 65%. Wall motion was normal;   there were no regional wall motion abnormalities. Doppler   parameters are consistent with abnormal left ventricular   relaxation (grade 1 diastolic dysfunction). The E/e&' ratio is <8,   suggesting normal LV filling pressure. - Aortic valve: Sclerosis without stenosis. There was trivial   regurgitation. - Left atrium: The atrium was normal in size. - Tricuspid valve: There was trivial regurgitation. - Pulmonary arteries: PA peak pressure: 30 mm  Hg (S). - Inferior vena cava: The vessel was normal in size. The   respirophasic diameter changes were in the normal range (>= 50%),   consistent with normal central venous pressure.  Impressions:  - Compared to a prior study in  2018, the LVEF is higher at 60-65%   and there is now grade 1 DD with normal LV filling pressure.   EKG:  EKG is ordered today.  The ekg ordered today demonstrates atrial sensed, biventricularly paced rhythm  Recent Labs: 02/25/2017: B Natriuretic Peptide 18.5 02/27/2017: ALT 30; BUN 17; Creatinine, Ser 1.00; Hemoglobin 13.5; Platelets 306; Potassium 3.7; Sodium 140  Recent Lipid Panel    Component Value Date/Time   CHOL  02/11/2010 0555    103        ATP III CLASSIFICATION:  <200     mg/dL   Desirable  200-239  mg/dL   Borderline High  >=240    mg/dL   High          TRIG 85 02/11/2010 0555   HDL 42 02/11/2010 0555   CHOLHDL 2.5 02/11/2010 0555   VLDL 17 02/11/2010 0555   LDLCALC  02/11/2010 0555    44        Total Cholesterol/HDL:CHD Risk Coronary Heart Disease Risk Table                     Men   Women  1/2 Average Risk   3.4   3.3  Average Risk       5.0   4.4  2 X Average Risk   9.6   7.1  3 X Average Risk  23.4   11.0        Use the calculated Patient Ratio above and the CHD Risk Table to determine the patient's CHD Risk.        ATP III CLASSIFICATION (LDL):  <100     mg/dL   Optimal  100-129  mg/dL   Near or Above                    Optimal  130-159  mg/dL   Borderline  160-189  mg/dL   High  >190     mg/dL   Very High    Physical Exam:    VS:  BP 100/70   Pulse (!) 58   Ht 5' 7.5" (1.715 m)   Wt 165 lb 3.2 oz (74.9 kg)   SpO2 99%   BMI 25.49 kg/m     Wt Readings from Last 3 Encounters:  11/13/17 165 lb 3.2 oz (74.9 kg)  03/23/17 167 lb 3.2 oz (75.8 kg)  03/01/17 164 lb (74.4 kg)     GEN: Well nourished, well developed in no acute distress HEENT: Normal NECK: JVD 8 cm, increased to 10 cm with hepatic pressure but no  sustained HJR; No carotid bruits LYMPHATICS: No lymphadenopathy CARDIAC: regular rhythm, normal S1 and S2, no murmurs, rubs, gallops. Radial and DP pulses 2+ bilaterally. RESPIRATORY:  Clear to auscultation without rales, wheezing or rhonchi  ABDOMEN: Soft, non-tender, non-distended MUSCULOSKELETAL:  No edema; No deformity  SKIN: Warm and dry NEUROLOGIC:  Alert and oriented x 3. Fine tremor noted in face bilaterally PSYCHIATRIC:  Normal to somewhat flat affect   ASSESSMENT:    1. Nonischemic cardiomyopathy (HCC)   2. Paroxysmal atrial fibrillation ([place])   3. Benign essential HTN   4. Long term current use of anticoagulant therapy   5. Splenic infarction    PLAN:    1. Nonischemic cardiomyopathy:  -asymptomatic and nearly euvolemic today (mild increase with hepatic pressure)  -started on spironolactone previously for elevated optivol readings, tolerating well  -no BP room for additional medications  -EF normalized with CRT  2. Hypertension: now runs on the low side, on only 12.5 mg spironolactone  3. Splenic infarction, with rare atrial fib seen at the time, on apixaban CHA2DS2/VAS Stroke Risk Points  =3. It was never fully determined what caused her splenic infarction, as she had a possible PFO on TEE but also had only very brief afib seen on her device. Dr. Wynonia Lawman previously started apixaban, and she is tolerating this without issue. She prefers to be on anticoagulation vs. Risking another embolic event given the uncertain cause.    Plan for follow up: 6 mos or sooner PRN.  Medication Adjustments/Labs and Tests Ordered: Current medicines are reviewed at length with the patient today.  Concerns regarding medicines are outlined above.  Orders Placed This Encounter  Procedures  . EKG 12-Lead   No orders of the defined types were placed in this encounter.   Patient Instructions  Medication Instructions:  Your Physician recommend you continue on your current medication as  directed.    If you need a refill on your cardiac medications before your next appointment, please call your pharmacy.   Lab work: None  Testing/Procedures: None  Follow-Up: At [facility], you and your health needs are our priority.  As part of our continuing mission to provide you with exceptional heart care, we have created designated Provider Care Teams.  These Care Teams include your primary Cardiologist (physician) and Advanced Practice Providers (APPs -  Physician Assistants and Nurse Practitioners) who all work together to provide you with the care you need, when you need it. You will need a follow up appointment in 6 months.  Please call our office 2 months in advance to schedule this appointment.  You may see Dr. Harrell Gave or one of the following Advanced Practice Providers on your designated Care Team:   Rosaria Ferries, PA-C . Jory Sims, DNP, ANP  Any Other Special Instructions Will Be Listed Below (If Applicable).       Signed, Buford Dresser, MD PhD 11/13/2017 5:37 PM    [place]

## 2017-11-14 ENCOUNTER — Other Ambulatory Visit: Payer: Self-pay | Admitting: Internal Medicine

## 2017-11-28 ENCOUNTER — Telehealth: Payer: Self-pay

## 2017-11-28 ENCOUNTER — Ambulatory Visit (INDEPENDENT_AMBULATORY_CARE_PROVIDER_SITE_OTHER): Payer: Medicare Other

## 2017-11-28 DIAGNOSIS — I428 Other cardiomyopathies: Secondary | ICD-10-CM

## 2017-11-28 NOTE — Telephone Encounter (Signed)
I spoke with the patient about this.

## 2017-11-29 NOTE — Progress Notes (Signed)
Remote pacemaker transmission.   

## 2018-01-24 LAB — CUP PACEART REMOTE DEVICE CHECK
Battery Remaining Longevity: 113 mo
Battery Voltage: 3.03 V
Brady Statistic AP VP Percent: 2.85 %
Brady Statistic AP VS Percent: 0.08 %
Brady Statistic AS VP Percent: 95.39 %
Brady Statistic AS VS Percent: 1.69 %
Brady Statistic RA Percent Paced: 2.92 %
Brady Statistic RV Percent Paced: 0.75 %
Date Time Interrogation Session: 20191119222109
Implantable Lead Implant Date: 20130110
Implantable Lead Implant Date: 20130110
Implantable Lead Implant Date: 20130110
Implantable Lead Location: 753858
Implantable Lead Location: 753859
Implantable Lead Location: 753860
Implantable Lead Model: 4196
Implantable Lead Model: 5076
Implantable Lead Model: 6935
Implantable Pulse Generator Implant Date: 20181115
Lead Channel Impedance Value: 1235 Ohm
Lead Channel Impedance Value: 361 Ohm
Lead Channel Impedance Value: 437 Ohm
Lead Channel Impedance Value: 608 Ohm
Lead Channel Impedance Value: 665 Ohm
Lead Channel Impedance Value: 779 Ohm
Lead Channel Impedance Value: 779 Ohm
Lead Channel Impedance Value: 798 Ohm
Lead Channel Impedance Value: 874 Ohm
Lead Channel Pacing Threshold Amplitude: 0.75 V
Lead Channel Pacing Threshold Amplitude: 1.375 V
Lead Channel Pacing Threshold Amplitude: 2.5 V
Lead Channel Pacing Threshold Pulse Width: 0.4 ms
Lead Channel Pacing Threshold Pulse Width: 0.4 ms
Lead Channel Pacing Threshold Pulse Width: 0.8 ms
Lead Channel Sensing Intrinsic Amplitude: 0.875 mV
Lead Channel Sensing Intrinsic Amplitude: 0.875 mV
Lead Channel Sensing Intrinsic Amplitude: 19.125 mV
Lead Channel Sensing Intrinsic Amplitude: 19.125 mV
Lead Channel Setting Pacing Amplitude: 2 V
Lead Channel Setting Pacing Amplitude: 2.5 V
Lead Channel Setting Pacing Amplitude: 4 V
Lead Channel Setting Pacing Pulse Width: 0.8 ms
Lead Channel Setting Pacing Pulse Width: 1 ms
Lead Channel Setting Sensing Sensitivity: 0.9 mV

## 2018-02-27 ENCOUNTER — Ambulatory Visit (INDEPENDENT_AMBULATORY_CARE_PROVIDER_SITE_OTHER): Payer: Medicare Other

## 2018-02-27 DIAGNOSIS — I480 Paroxysmal atrial fibrillation: Secondary | ICD-10-CM | POA: Diagnosis not present

## 2018-02-27 DIAGNOSIS — Z950 Presence of cardiac pacemaker: Secondary | ICD-10-CM

## 2018-02-28 LAB — CUP PACEART REMOTE DEVICE CHECK
Battery Remaining Longevity: 123 mo
Battery Voltage: 3.02 V
Brady Statistic AP VP Percent: 0.89 %
Brady Statistic AP VS Percent: 0.03 %
Brady Statistic AS VP Percent: 97.37 %
Brady Statistic AS VS Percent: 1.71 %
Brady Statistic RA Percent Paced: 0.93 %
Brady Statistic RV Percent Paced: 1.1 %
Date Time Interrogation Session: 20200219095326
Implantable Lead Implant Date: 20130110
Implantable Lead Implant Date: 20130110
Implantable Lead Implant Date: 20130110
Implantable Lead Location: 753858
Implantable Lead Location: 753859
Implantable Lead Location: 753860
Implantable Lead Model: 4196
Implantable Lead Model: 5076
Implantable Lead Model: 6935
Implantable Pulse Generator Implant Date: 20181115
Lead Channel Impedance Value: 1292 Ohm
Lead Channel Impedance Value: 342 Ohm
Lead Channel Impedance Value: 418 Ohm
Lead Channel Impedance Value: 513 Ohm
Lead Channel Impedance Value: 532 Ohm
Lead Channel Impedance Value: 589 Ohm
Lead Channel Impedance Value: 665 Ohm
Lead Channel Impedance Value: 836 Ohm
Lead Channel Impedance Value: 912 Ohm
Lead Channel Pacing Threshold Amplitude: 0.625 V
Lead Channel Pacing Threshold Amplitude: 1.375 V
Lead Channel Pacing Threshold Amplitude: 2.375 V
Lead Channel Pacing Threshold Pulse Width: 0.4 ms
Lead Channel Pacing Threshold Pulse Width: 0.4 ms
Lead Channel Pacing Threshold Pulse Width: 0.8 ms
Lead Channel Sensing Intrinsic Amplitude: 0.625 mV
Lead Channel Sensing Intrinsic Amplitude: 0.625 mV
Lead Channel Sensing Intrinsic Amplitude: 15.875 mV
Lead Channel Sensing Intrinsic Amplitude: 15.875 mV
Lead Channel Setting Pacing Amplitude: 2 V
Lead Channel Setting Pacing Amplitude: 2 V
Lead Channel Setting Pacing Amplitude: 4 V
Lead Channel Setting Pacing Pulse Width: 0.8 ms
Lead Channel Setting Pacing Pulse Width: 1 ms
Lead Channel Setting Sensing Sensitivity: 0.9 mV

## 2018-03-07 NOTE — Progress Notes (Signed)
Remote pacemaker transmission.   

## 2018-03-12 ENCOUNTER — Encounter: Payer: Self-pay | Admitting: Cardiology

## 2018-03-16 ENCOUNTER — Telehealth: Payer: Self-pay | Admitting: Cardiology

## 2018-03-16 MED ORDER — SPIRONOLACTONE 25 MG PO TABS: 12.5000 mg | ORAL_TABLET | Freq: Two times a day (BID) | ORAL | 2 refills | Status: DC

## 2018-03-16 NOTE — Telephone Encounter (Signed)
Rx(s) sent to pharmacy electronically.  

## 2018-03-16 NOTE — Telephone Encounter (Signed)
New message    *STAT* If patient is at the pharmacy, call can be transferred to refill team.   1. Which medications need to be refilled? (please list name of each medication and dose if known) spironolactone (ALDACTONE) 25 MG tablet  2. Which pharmacy/location (including street and city if local pharmacy) is medication to be sent to?[id number] - [place], [place] - [address]  3. Do they need a 30 day or 90 day supply? [place]

## 2018-04-06 ENCOUNTER — Telehealth: Payer: Self-pay | Admitting: Internal Medicine

## 2018-04-06 NOTE — Telephone Encounter (Signed)
Patient returned you call. 

## 2018-04-06 NOTE — Telephone Encounter (Signed)
Follow up    Pt returned call in regards to visit on 03.31.20. Pt was signed up for MyChart, consent was sent about video visit, and patient walked through and was able to see and have audio on Webex.

## 2018-04-06 NOTE — Telephone Encounter (Signed)
New message   Left message on phone for patient to call back regarding appt with Dr. Lovena Le on Tuesday 03.31.20.

## 2018-04-10 ENCOUNTER — Telehealth (INDEPENDENT_AMBULATORY_CARE_PROVIDER_SITE_OTHER): Payer: Medicare Other | Admitting: Internal Medicine

## 2018-04-10 ENCOUNTER — Telehealth: Payer: Self-pay | Admitting: Internal Medicine

## 2018-04-10 ENCOUNTER — Other Ambulatory Visit: Payer: Self-pay

## 2018-04-10 DIAGNOSIS — I5022 Chronic systolic (congestive) heart failure: Secondary | ICD-10-CM

## 2018-04-10 DIAGNOSIS — I480 Paroxysmal atrial fibrillation: Secondary | ICD-10-CM

## 2018-04-10 DIAGNOSIS — Z950 Presence of cardiac pacemaker: Secondary | ICD-10-CM | POA: Diagnosis not present

## 2018-04-10 NOTE — Telephone Encounter (Signed)
Pt changing to telephone visit per request.

## 2018-04-10 NOTE — Progress Notes (Signed)
Electrophysiology TeleHealth Note   Due to national recommendations of social distancing due to [place] 19, an audio telehealth visit is felt to be most appropriate for this patient at this time.  See MyChart message from today for the patient's consent to telehealth for [facility].   Date:  04/10/2018   ID:  CHEYNE BUNGERT, DOB November 13, 1955, MRN [id number]  Location: patient's home  Provider location: [address] [place]  Evaluation Performed: Follow-up visit  PCP:  Glenford Bayley, DO  Cardiologist:  Dr. Harrell Gave Electrophysiologist:  Dr Lovena Le  Chief Complaint:  "I feel good"  History of Present Illness:    Jennavie F Pasqua is a 63 y.o. female who presents via audio/video conferencing for a telehealth visit today. She has a h/o chronic systolic heart failure with normalization of her LV function with biv pacing. In the interim, she has begun walking over a mile a day. Today, she denies symptoms of palpitations, chest pain, shortness of breath, lower extremity edema, dizziness, presyncope, or syncope.  The patient is otherwise without complaint today.  The patient denies symptoms of fevers, chills, cough, or new SOB worrisome for COVID 19.  Past Medical History:  Diagnosis Date  . AF (paroxysmal atrial fibrillation) ([place])   . Arthritis   . Biventricular cardiac pacemaker in situ    a. prior CRT-D in 2013 with downgrade to CRT-Pacemaker only in 11/2016.  Marland Kitchen Chronic systolic CHF (congestive heart failure) ([place])   . Complication of anesthesia    aspiration  . Essential tremor   . Fibroids   . GERD (gastroesophageal reflux disease)    otc  . Hypertension   . Hyperthyroidism   . Hypothyroidism   . LBBB (left bundle branch block)    conduction disorder  . Migraines   . Mild aortic insufficiency   . NICM (nonischemic cardiomyopathy) ([place])    a. 2012: normal coronaries, EF 25-30%. b. improved to 55% 11/2016.  . Osteoporosis   . Pleural effusion, right    thoracentesis 02/09/09   . Raynaud's disease   . Scoliosis   . 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 ARTERY BIOPSY  12/29/2011   Procedure: MINOR BIOPSY TEMPORAL ARTERY;  Surgeon: Edward Jolly, MD;  Location: [facility];  Service: General;  Laterality: Right;  Right temporal artery biopsy  . BACK SURGERY  12/31/2009   "for flat back syndrome; fused bottom of my back"  . BI-VENTRICULAR IMPLANTABLE CARDIOVERTER DEFIBRILLATOR Left 01/20/2011   Procedure: BI-VENTRICULAR IMPLANTABLE CARDIOVERTER DEFIBRILLATOR  (CRT-D);  Surgeon: Evans Lance, MD;  Location[facility] C[facility];  Service: Cardiovascular;  Laterality: Left;  . BIV ICD GENERATOR CHANGEOUT N/A 11/24/2016   Procedure: BIV ICD GENERATOR CHANGEOUT;  Surgeon: Evans Lance, MD;  Location: [facility] CV LAB;  Service: Cardiovascular;  Laterality: N/A;  . CHOLECYSTECTOMY  ~1996  . FOOT NEUROMA SURGERY  ~ 2003   right  . ROTATOR CUFF REPAIR  ~ 2009   left  . SHOULDER ARTHROSCOPY W/ ROTATOR CUFF REPAIR  09/21/2011  . SKIN CANCER EXCISION     nose, forehead, left leg  . TEE WITHOUT CARDIOVERSION N/A 03/01/2017   Procedure: TRANSESOPHAGEAL ECHOCARDIOGRAM (TEE);  Surgeon: Skeet Latch, MD;  Location: [facility];  Service: Cardiovascular;  Laterality: N/A;  . THYROID LOBECTOMY  ~ 2006   right  . TUBAL LIGATION  ~  [place]  ~ 2009  . VESICOVAGINAL FISTULA CLOSURE W/ TAH      Current Outpatient Medications  Medication Sig Dispense Refill  . amitriptyline (ELAVIL) 25 MG tablet Take 83m by mouth every night  5  . baclofen (LIORESAL) 10 MG tablet Take 10 mg by mouth at bedtime.  1  . Carboxymethylcellul-Glycerin (LUBRICATING EYE DROPS OP) Place 1 drop into both eyes 2 (two) times daily.     . Cyanocobalamin (B-12 COMPLIANCE INJECTION) 1000 MCG/ML KIT Inject 1,000 mcg every 30 (thirty) days as directed.    .  diclofenac sodium (VOLTAREN) 1 % GEL Apply 2 g topically daily. TO AFFECTED AREAS  3  . diphenhydrAMINE (BENADRYL) 25 MG tablet Take 25 mg at bedtime by mouth.    . eletriptan (RELPAX) 40 MG tablet Take 40 mg by mouth daily as needed for migraine. May repeat in 2 hours if necessary    . ELIQUIS 5 MG TABS tablet Take 5 mg by mouth 2 (two) times daily.  12  . fluorouracil (EFUDEX) 5 % cream Apply to affected areas of the face every other night    . fluticasone (FLONASE) 50 MCG/ACT nasal spray Place 1 spray into both nostrils daily as needed for allergies or rhinitis.    .Marland Kitchengabapentin (NEURONTIN) 600 MG tablet Take 1 tablet (600 mg total) by mouth 3 (three) times daily. (Patient taking differently: Take 600 mg 2 (two) times daily by mouth. ) 270 tablet 4  . levothyroxine (SYNTHROID, LEVOTHROID) 100 MCG tablet Take 100 mcg daily before breakfast by mouth.    . Melatonin 5 MG TABS Take 5 mg at bedtime by mouth.    . methocarbamol (ROBAXIN) 500 MG tablet Take 500 mg by mouth every 8 (eight) hours as needed.  1  . nitrofurantoin, macrocrystal-monohydrate, (MACROBID) 100 MG capsule     . omeprazole (PRILOSEC OTC) 20 MG tablet Take 20 mg daily as needed by mouth (acid reflux).    . polyethylene glycol (MIRALAX / GLYCOLAX) packet Take 17 g every evening by mouth.    . propranolol (INDERAL) 60 MG tablet Take 60 mg by mouth daily.    .Marland Kitchenspironolactone (ALDACTONE) 25 MG tablet Take 0.5 tablets (12.5 mg total) by mouth 2 (two) times daily. 90 tablet 2   No current facility-administered medications for this visit.     Allergies:   Other; Tape; Codeine; Fentanyl; Morphine and related; and Robaxin [methocarbamol]   Social History:  The patient  reports that she has never smoked. She has never used smokeless tobacco. She reports that she does not drink alcohol or use drugs.   Family History:  The patient's  family history includes Atrial fibrillation in her sister; Heart disease in her mother, sister, and sister;  Heart disease (age of onset: 956 in her father; Heart failure (age of onset: 862 in her mother; Hypertension in her mother; Macular degeneration in her father; Stroke in her mother.   ROS:  Please see the history of present illness.   All other systems are personally reviewed and negative.    Exam:    Vital Signs:  There were no vitals taken for this visit.  Well appearing, alert and conversant, regular work of breathing,  good skin color Eyes- anicteric, neuro- grossly intact, skin- no apparent rash or lesions or cyanosis, mouth- oral mucosa is pink   Labs/Other Tests and Data Reviewed:    Recent Labs: No results found for requested labs within last 8760 hours.  Wt Readings from Last 3 Encounters:  11/13/17 165 lb 3.2 oz (74.9 kg)  03/23/17 167 lb 3.2 oz (75.8 kg)  03/01/17 164 lb (74.4 kg)     Other studies personally reviewed  Last device remote is reviewed from [employer] PDF dated 02/10/2018 which reveals normal device function, no arrhythmias  And 10 years of battery longevity   ASSESSMENT & PLAN:    1.  Chronic systolic heart failure - she continues to do well with normalization of her LV function. She is biv pacing. She will undergo watchful waiting. 2. Biv PPM - her medtronic biv ppm is working normally 3. Overweight - she is encouraged to lose 10 lbs.  4. COVID 19 screen The patient denies symptoms of COVID 19 at this time.  The importance of social distancing was discussed today.  Follow-up:  6 months Next remote: 05/2018  Current medicines are reviewed at length with the patient today.   The patient does not have concerns regarding her medicines.  The following changes were made today:  none  Labs/ tests ordered today include: none No orders of the defined types were placed in this encounter.    Patient Risk:  after full review of this patients clinical status, I feel that they are at moderate risk at this time.  Today, I have spent 22 minutes with the patient  with telehealth technology discussing her problems above .    Signed, Cristopher Peru, MD  04/10/2018 2:29 PM     [place] [address] [place] [address] [phone] (office) [phone] (fax)

## 2018-04-10 NOTE — Telephone Encounter (Signed)
New Message   Patient has 2:30 Evisit appointment and having a hard time getting on would like the nurse to give her a call.

## 2018-04-11 ENCOUNTER — Other Ambulatory Visit: Payer: Self-pay | Admitting: Cardiology

## 2018-04-11 MED ORDER — ELIQUIS 5 MG PO TABS: 5.0000 mg | ORAL_TABLET | Freq: Two times a day (BID) | ORAL | 6 refills | Status: DC

## 2018-04-11 NOTE — Addendum Note (Signed)
Addended by: Waylan Rocher on: 04/11/2018 02:46 PM   Modules accepted: Orders

## 2018-04-11 NOTE — Telephone Encounter (Signed)
°*  STAT* If patient is at the pharmacy, call can be transferred to refill team.   1. Which medications need to be refilled? (please list name of each medication and dose if known) new prescription for Eliquis 5 mg  2. Which pharmacy/location (including street and city if local pharmacy) is medication to be sent to? [facility] [phone]  3. Do they need a 30 day or 90 day supply? 180 and refills

## 2018-05-16 ENCOUNTER — Telehealth: Payer: Self-pay

## 2018-05-16 NOTE — Telephone Encounter (Signed)
Attempted to contact pt to change appointment to virtual visit. Left message to call back. 

## 2018-05-17 NOTE — Telephone Encounter (Signed)
Attempted to contact patient to switch her to a virtual visit. No answer. Second attempt made to reach her. Appointment cancelled.  Left detailed message advising her to reschedule her appointment.

## 2018-05-18 ENCOUNTER — Ambulatory Visit: Payer: Medicare Other | Admitting: Cardiology

## 2018-05-21 ENCOUNTER — Telehealth: Payer: Self-pay | Admitting: *Deleted

## 2018-05-21 NOTE — Telephone Encounter (Signed)
A message was left, re: follow up visit. 

## 2018-05-21 NOTE — Telephone Encounter (Signed)
New Message ° ° °Patient returning your call. °

## 2018-05-29 ENCOUNTER — Ambulatory Visit (INDEPENDENT_AMBULATORY_CARE_PROVIDER_SITE_OTHER): Payer: Medicare Other | Admitting: *Deleted

## 2018-05-29 ENCOUNTER — Other Ambulatory Visit: Payer: Self-pay

## 2018-05-29 DIAGNOSIS — I480 Paroxysmal atrial fibrillation: Secondary | ICD-10-CM

## 2018-05-30 LAB — CUP PACEART REMOTE DEVICE CHECK
Battery Remaining Longevity: 105 mo
Battery Voltage: 3.01 V
Brady Statistic AP VP Percent: 0.89 %
Brady Statistic AP VS Percent: 0.03 %
Brady Statistic AS VP Percent: 97.37 %
Brady Statistic AS VS Percent: 1.71 %
Brady Statistic RA Percent Paced: 0.94 %
Brady Statistic RV Percent Paced: 0.68 %
Date Time Interrogation Session: 20200519123120
Implantable Lead Implant Date: 20130110
Implantable Lead Implant Date: 20130110
Implantable Lead Implant Date: 20130110
Implantable Lead Location: 753858
Implantable Lead Location: 753859
Implantable Lead Location: 753860
Implantable Lead Model: 4196
Implantable Lead Model: 5076
Implantable Lead Model: 6935
Implantable Pulse Generator Implant Date: 20181115
Lead Channel Impedance Value: 1292 Ohm
Lead Channel Impedance Value: 323 Ohm
Lead Channel Impedance Value: 342 Ohm
Lead Channel Impedance Value: 342 Ohm
Lead Channel Impedance Value: 418 Ohm
Lead Channel Impedance Value: 589 Ohm
Lead Channel Impedance Value: 646 Ohm
Lead Channel Impedance Value: 836 Ohm
Lead Channel Impedance Value: 874 Ohm
Lead Channel Pacing Threshold Amplitude: 0.625 V
Lead Channel Pacing Threshold Amplitude: 1.5 V
Lead Channel Pacing Threshold Amplitude: 2.25 V
Lead Channel Pacing Threshold Pulse Width: 0.4 ms
Lead Channel Pacing Threshold Pulse Width: 0.4 ms
Lead Channel Pacing Threshold Pulse Width: 0.8 ms
Lead Channel Sensing Intrinsic Amplitude: 1 mV
Lead Channel Sensing Intrinsic Amplitude: 1 mV
Lead Channel Sensing Intrinsic Amplitude: 12.75 mV
Lead Channel Sensing Intrinsic Amplitude: 12.75 mV
Lead Channel Setting Pacing Amplitude: 2 V
Lead Channel Setting Pacing Amplitude: 2.5 V
Lead Channel Setting Pacing Amplitude: 4 V
Lead Channel Setting Pacing Pulse Width: 0.8 ms
Lead Channel Setting Pacing Pulse Width: 1 ms
Lead Channel Setting Sensing Sensitivity: 0.9 mV

## 2018-06-07 ENCOUNTER — Encounter: Payer: Self-pay | Admitting: Cardiology

## 2018-06-07 NOTE — Progress Notes (Signed)
Remote pacemaker transmission.   

## 2018-06-21 ENCOUNTER — Telehealth: Payer: Self-pay | Admitting: Cardiology

## 2018-06-21 NOTE — Telephone Encounter (Signed)
She had a normal coronary cath in 2013 with Dr. Wynonia Lawman, but her symptoms are concerning. Her blood pressure is lower than usual (though she does run low). It may be pill dysphagia with the bactrim, but I cannot tell her for sure that this isn't her heart. If the pain is significant and not improving, she may need to be checked with ECG and bloodwork in the ER to make sure it isn't her heart. I am rounding this week and not in the office, but if she would like to see me sooner I can do a virtual visit with her next week. Thank you!

## 2018-06-21 NOTE — Telephone Encounter (Signed)
Spoke with pt, she reports feeling some better. She put a heating pad to her neck and has been sipping on warm fluid. Patient voiced understanding to go to ER if her symptoms do not improve. She was offered a virtual visit next week but she declined and wants to wait to be seen in the office.

## 2018-06-21 NOTE — Telephone Encounter (Signed)
Spoke with pt, she woke this with a knot in her throat and a feeling like something pressing down on her upper chest. She put biofreeze on the area and took tums with no relief. She denies SOB, bp 83/50 this morning and pulse is regular. She denies swelling and states it feels like something is stuck in her throat. She did take a bactrum this morning for a UTI. She has a follow up 07-10-2018 with dr Harrell Gave. Will forward to dr Harrell Gave to review and advise.

## 2018-06-21 NOTE — Telephone Encounter (Signed)
Pt c/o of Chest Pain: STAT if CP now or developed within 24 hours  1. Are you having CP right now? yes  2. Are you experiencing any other symptoms (ex. SOB, nausea, vomiting, sweating)? NO  3. How long have you been experiencing CP? 2 - 3 hours  4. Is your CP continuous or coming and going? cont inus   5. Have you taken Nitroglycerin? no ?

## 2018-07-09 ENCOUNTER — Telehealth: Payer: Self-pay

## 2018-07-09 NOTE — Telephone Encounter (Signed)
Called pt to inform that Dr. Harrell Gave will be in office tomorrow in case pt would like to be seen in person. Left message to call back.

## 2018-07-10 ENCOUNTER — Telehealth (INDEPENDENT_AMBULATORY_CARE_PROVIDER_SITE_OTHER): Payer: Medicare Other | Admitting: Cardiology

## 2018-07-10 VITALS — BP 99/66 | HR 56 | Ht 67.5 in | Wt 162.0 lb

## 2018-07-10 DIAGNOSIS — I10 Essential (primary) hypertension: Secondary | ICD-10-CM

## 2018-07-10 DIAGNOSIS — I480 Paroxysmal atrial fibrillation: Secondary | ICD-10-CM

## 2018-07-10 DIAGNOSIS — I428 Other cardiomyopathies: Secondary | ICD-10-CM | POA: Diagnosis not present

## 2018-07-10 DIAGNOSIS — Z7189 Other specified counseling: Secondary | ICD-10-CM | POA: Diagnosis not present

## 2018-07-10 NOTE — Progress Notes (Signed)
Virtual Visit via Telephone Note   This visit type was conducted due to national recommendations for restrictions regarding the COVID-19 Pandemic (e.g. social distancing) in an effort to limit this patient's exposure and mitigate transmission in our community.  Due to her co-morbid illnesses, this patient is at least at moderate risk for complications without adequate follow up.  This format is felt to be most appropriate for this patient at this time.  The patient did not have access to video technology/had technical difficulties with video requiring transitioning to audio format only (telephone).  All issues noted in this document were discussed and addressed.  No physical exam could be performed with this format.  Please refer to the patient's chart for her  consent to telehealth for [facility].   Date:  07/10/2018   ID:  [name], DOB 20-May-1955, MRN [id number]  Patient Location: Home Provider Location: Office  PCP:  Glenford Bayley, DO  Cardiologist:  Buford Dresser, MD  Electrophysiologist:  Cristopher Peru, MD   Evaluation Performed:  Follow-Up Visit  Chief Complaint:  Follow up  History of Present Illness:    [name] is a 63 y.o. female with a hx of LBBB, NICM s/p CRT-D-->P with normalization of EF, paroxysmal atrial fibrillation.   The patient does not have symptoms concerning for COVID-19 infection (fever, chills, cough, or new shortness of breath).   Today:  Overall she is doing well. She denies any typical angina but does note that she was on bactrim, had a pill get stuck in her throat, and had continuous throat/upper chest pain for two days afterward (occurred in 06/2018).  Her migraines are improving now that she is on injections.  Denies shortness of breath at rest or with minimal exertion, but does note feeling winded going uphill. She is working to lose weight (currently 162 lbs and stable), no increased weight. She sometimes notes that her ankles  feel tight at the end of the day, but she has not had significant swelling. She is able to walk 1.5-2 miles every day.  She may potentially need an MRI for her severe chronic neck pain/known spondylosis. She is asking if she should contact Dr. Tanna Furry office regarding her CRT device.   Past Medical History:  Diagnosis Date   AF (paroxysmal atrial fibrillation) (HCC)    Arthritis    Biventricular cardiac pacemaker in situ    a. prior CRT-D in 2013 with downgrade to CRT-Pacemaker only in 11/2016.   Chronic systolic CHF (congestive heart failure) (HCC)    Complication of anesthesia    aspiration   Essential tremor    Fibroids    GERD (gastroesophageal reflux disease)    otc   Hypertension    Hyperthyroidism    Hypothyroidism    LBBB (left bundle branch block)    conduction disorder   Migraines    Mild aortic insufficiency    NICM (nonischemic cardiomyopathy) ([place])    a. 2012: normal coronaries, EF 25-30%. b. improved to 55% 11/2016.   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, forehead, & left leg"   Past Surgical History:  Procedure Laterality Date   ARTERY BIOPSY  12/29/2011   Procedure: MINOR BIOPSY TEMPORAL ARTERY;  Surgeon: Edward Jolly, MD;  Location: [place];  Service: General;  Laterality: Right;  Right temporal  artery biopsy   BACK SURGERY  12/31/2009   "for flat back syndrome; fused bottom of my back"   BI-VENTRICULAR IMPLANTABLE CARDIOVERTER DEFIBRILLATOR Left 01/20/2011   Procedure: BI-VENTRICULAR IMPLANTABLE CARDIOVERTER DEFIBRILLATOR  (CRT-D);  Surgeon: Evans Lance, MD;  Location: [facility] CATH LAB;  Service: Cardiovascular;  Laterality: Left;   BIV ICD GENERATOR CHANGEOUT N/A 11/24/2016   Procedure: BIV ICD GENERATOR CHANGEOUT;  Surgeon: Evans Lance, MD;  Location: [place] CV LAB;  Service: Cardiovascular;   Laterality: N/A;   CHOLECYSTECTOMY  ~1996   FOOT NEUROMA SURGERY  ~ 2003   right   ROTATOR CUFF REPAIR  ~ 2009   left   SHOULDER ARTHROSCOPY W/ ROTATOR CUFF REPAIR  09/21/2011   SKIN CANCER EXCISION     nose, forehead, left leg   TEE WITHOUT CARDIOVERSION N/A 03/01/2017   Procedure: TRANSESOPHAGEAL ECHOCARDIOGRAM (TEE);  Surgeon: Skeet Latch, MD;  Location: [place];  Service: Cardiovascular;  Laterality: N/A;   THYROID LOBECTOMY  ~ 2006   right   TUBAL LIGATION  ~ [place]  ~ 2009   VESICOVAGINAL FISTULA CLOSURE W/ TAH       Current Meds  Medication Sig   AJOVY 225 MG/1.5ML SOAJ Inject 225 mg into the skin every 30 (thirty) days.   amitriptyline (ELAVIL) 25 MG tablet Take 47m by mouth every night   CALCIUM PO Take 2 tablets by mouth daily.   Carboxymethylcellul-Glycerin (LUBRICATING EYE DROPS OP) Place 1 drop into both eyes 2 (two) times daily.    Cyanocobalamin (B-12 COMPLIANCE INJECTION) 1000 MCG/ML KIT Inject 1,000 mcg every 30 (thirty) days as directed.   diphenhydrAMINE (BENADRYL) 25 MG tablet Take 25 mg at bedtime by mouth.   eletriptan (RELPAX) 40 MG tablet Take 40 mg by mouth daily as needed for migraine. May repeat in 2 hours if necessary   ELIQUIS 5 MG TABS tablet Take 1 tablet (5 mg total) by mouth 2 (two) times daily.   fluticasone (FLONASE) 50 MCG/ACT nasal spray Place 1 spray into both nostrils daily as needed for allergies or rhinitis.   gabapentin (NEURONTIN) 600 MG tablet Take 1 tablet (600 mg total) by mouth 3 (three) times daily. (Patient taking differently: Take 600 mg 2 (two) times daily by mouth. )   levothyroxine (SYNTHROID, LEVOTHROID) 100 MCG tablet Take 100 mcg daily before breakfast by mouth.   Melatonin 3 MG TABS Take 9 mg by mouth at bedtime.    montelukast (SINGULAIR) 10 MG tablet Take 10 mg by mouth daily.   polyethylene glycol (MIRALAX / GLYCOLAX) packet Take 17 g every evening by mouth.    propranolol (INDERAL) 60 MG tablet Take 60 mg by mouth daily.   spironolactone (ALDACTONE) 25 MG tablet Take 0.5 tablets (12.5 mg total) by mouth 2 (two) times daily.   VITAMIN D PO Take 2 capsules by mouth daily.     Allergies:   Other, Tape, Codeine, Fentanyl, Morphine and related, and Robaxin [methocarbamol]   Social History   Tobacco Use   Smoking status: Never Smoker   Smokeless tobacco: Never Used  Substance Use Topics   Alcohol use: No   Drug use: No     Family Hx: The patient's family history includes Atrial fibrillation in her sister; Heart disease in her mother, sister, and sister; Heart disease (age of onset: 910 in her father; Heart failure (age of onset: 852 in her mother; Hypertension in her mother; Macular degeneration in her father; Stroke in her mother.  ROS:   Please see the history of present illness.    Constitutional: Negative for chills, fever, night sweats, unintentional weight loss  HENT: Negative for ear pain and hearing loss.   Eyes: Negative for loss of vision and eye pain.  Respiratory: Negative for cough, sputum, wheezing.   Cardiovascular: See HPI. Gastrointestinal: Negative for abdominal pain, melena, and hematochezia.  Genitourinary: Negative for dysuria and hematuria.  Musculoskeletal: Negative for falls and myalgias.  Skin: Negative for itching and rash.  Neurological: Negative for focal weakness, focal sensory changes and loss of consciousness.  Endo/Heme/Allergies: Does not bruise/bleed easily.  All other systems reviewed and are negative.   Prior CV studies:   The following studies were reviewed today: TEE 03/12/2017 - Left ventricle: Systolic function was normal. Wall motion was normal; there were no regional wall motion abnormalities. - Aortic valve: There was mild regurgitation. - Mitral valve: There was trivial regurgitation. - Left atrium: No evidence of thrombus in the atrial cavity or appendage. No evidence of thrombus in  the atrial cavity or appendage. - Right ventricle: The cavity size was normal. Wall thickness was normal. Systolic function was normal. - Right atrium: No evidence of thrombus in the atrial cavity or appendage. - Atrial septum: A patent foramen ovale is likely. There was no right to left flow at rest but saline microcavitation study was positive with abdominal pressure. The test was repeated to ensure that there was no extracardiac shunt and was negative. - Tricuspid valve: There was trivial regurgitation.  TTE March 12, 2017 Study Conclusions  - Left ventricle: The cavity size was normal. Wall thickness was normal. Systolic function was normal. The estimated ejection fraction was in the range of 60% to 65%. Wall motion was normal; there were no regional wall motion abnormalities. Doppler parameters are consistent with abnormal left ventricular relaxation (grade 1 diastolic dysfunction). The E/e&' ratio is <8, suggesting normal LV filling pressure. - Aortic valve: Sclerosis without stenosis. There was trivial regurgitation. - Left atrium: The atrium was normal in size. - Tricuspid valve: There was trivial regurgitation. - Pulmonary arteries: PA peak pressure: 30 mm Hg (S). - Inferior vena cava: The vessel was normal in size. The respirophasic diameter changes were in the normal range (>= 50%), consistent with normal central venous pressure.  Impressions:  - Compared to a prior study in 2018, the LVEF is higher at 60-65% and there is now grade 1 DD with normal LV filling pressure.  Labs/Other Tests and Data Reviewed:    EKG:  An ECG dated 11/13/17 was personally reviewed today and demonstrated:  atrial sensed, biV paced rhythm  Recent Labs: No results found for requested labs within last 8760 hours.   Recent Lipid Panel Lab Results  Component Value Date/Time   CHOL  02/11/2010 05:55 AM    103        ATP III CLASSIFICATION:  <200     mg/dL    Desirable  200-239  mg/dL   Borderline High  >=240    mg/dL   High          TRIG 85 02/11/2010 05:55 AM   HDL 42 02/11/2010 05:55 AM   CHOLHDL 2.5 02/11/2010 05:55 AM   LDLCALC  02/11/2010 05:55 AM    44        Total Cholesterol/HDL:CHD Risk Coronary Heart Disease Risk Table                     Men   Women  1/2  Average Risk   3.4   3.3  Average Risk       5.0   4.4  2 X Average Risk   9.6   7.1  3 X Average Risk  23.4   11.0        Use the calculated Patient Ratio above and the CHD Risk Table to determine the patient's CHD Risk.        ATP III CLASSIFICATION (LDL):  <100     mg/dL   Optimal  100-129  mg/dL   Near or Above                    Optimal  130-159  mg/dL   Borderline  160-189  mg/dL   High  >190     mg/dL   Very High    Wt Readings from Last 3 Encounters:  07/10/18 162 lb (73.5 kg)  11/13/17 165 lb 3.2 oz (74.9 kg)  03/23/17 167 lb 3.2 oz (75.8 kg)     Objective:    Vital Signs:  BP 99/66    Pulse (!) 56    Ht 5' 7.5" (1.715 m)    Wt 162 lb (73.5 kg)    BMI 25.00 kg/m    Speaking comfortably on the phone, in no acute distress  ASSESSMENT & PLAN:    Nonischemic cardiomyopathy, LBBB: now EF normalized s/p CRT, follows with Dr. Lovena Le -started on spironolactone previously for elevated optivol readings, tolerating well -on propranolol 60 mg daily -if she is planned for MRI, agree that she should contact the device clinic at [facility]/Dr. Taylor's office so that they can program it appropriately prior to scan.  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  -It was never fully determined what caused her splenic infarction, as she had a possible PFO on TEE but also had only very brief afib seen on her device. Dr. Wynonia Lawman previously started apixaban, and she is tolerating this without issue. She prefers to be  on anticoagulation vs. Risking another embolic event given the uncertain cause.  COVID-19 Education: The signs and symptoms of COVID-19 were discussed with the patient and how to seek care for testing (follow up with PCP or arrange E-visit).  The importance of social distancing was discussed today.  Time:   Today, I have spent 17 minutes with the patient with telehealth technology discussing the above problems.     Medication Adjustments/Labs and Tests Ordered: Current medicines are reviewed at length with the patient today.  Concerns regarding medicines are outlined above.   Patient Instructions  Medication Instructions:  Your Physician recommend you continue on your current medication as directed.    If you need a refill on your cardiac medications before your next appointment, please call your pharmacy.   Lab work: None  Testing/Procedures: None  Follow-Up: At [facility], you and your health needs are our priority.  As part of our continuing mission to provide you with exceptional heart care, we have created designated Provider Care Teams.  These Care Teams include your primary Cardiologist (physician) and Advanced Practice Providers (APPs -  Physician Assistants and Nurse Practitioners) who all work together to provide you with the care you need, when you need it. You will need a follow up appointment in 6 months.  Please call our office 2 months in advance to schedule this appointment.  You may see Dr. Harrell Gave or one of the following Advanced Practice Providers on your designated Care Team:   Rosaria Ferries, PA-C  Jory Sims, DNP, ANP  *    Signed, Buford Dresser, MD  07/10/2018 11:57 PM    [place]

## 2018-07-10 NOTE — Patient Instructions (Signed)
Medication Instructions:  Your Physician recommend you continue on your current medication as directed.    If you need a refill on your cardiac medications before your next appointment, please call your pharmacy.   Lab work: None  Testing/Procedures: None  Follow-Up: At [facility], you and your health needs are our priority.  As part of our continuing mission to provide you with exceptional heart care, we have created designated Provider Care Teams.  These Care Teams include your primary Cardiologist (physician) and Advanced Practice Providers (APPs -  Physician Assistants and Nurse Practitioners) who all work together to provide you with the care you need, when you need it. You will need a follow up appointment in 6 months.  Please call our office 2 months in advance to schedule this appointment.  You may see Dr. Christopher or one of the following Advanced Practice Providers on your designated Care Team:   Rhonda Barrett, PA-C . Kathryn Lawrence, DNP, ANP     

## 2018-07-16 ENCOUNTER — Encounter: Payer: Self-pay | Admitting: Cardiology

## 2018-08-20 ENCOUNTER — Other Ambulatory Visit: Payer: Self-pay

## 2018-08-28 ENCOUNTER — Ambulatory Visit (INDEPENDENT_AMBULATORY_CARE_PROVIDER_SITE_OTHER): Payer: Medicare Other | Admitting: *Deleted

## 2018-08-28 DIAGNOSIS — I480 Paroxysmal atrial fibrillation: Secondary | ICD-10-CM | POA: Diagnosis not present

## 2018-08-29 LAB — CUP PACEART REMOTE DEVICE CHECK
Battery Remaining Longevity: 92 mo
Battery Voltage: 3 V
Brady Statistic AP VP Percent: 0.84 %
Brady Statistic AP VS Percent: 0.03 %
Brady Statistic AS VP Percent: 97.42 %
Brady Statistic AS VS Percent: 1.71 %
Brady Statistic RA Percent Paced: 0.89 %
Brady Statistic RV Percent Paced: 2.24 %
Date Time Interrogation Session: 20200818045631
Implantable Lead Implant Date: 20130110
Implantable Lead Implant Date: 20130110
Implantable Lead Implant Date: 20130110
Implantable Lead Location: 753858
Implantable Lead Location: 753859
Implantable Lead Location: 753860
Implantable Lead Model: 4196
Implantable Lead Model: 5076
Implantable Lead Model: 6935
Implantable Pulse Generator Implant Date: 20181115
Lead Channel Impedance Value: 1311 Ohm
Lead Channel Impedance Value: 399 Ohm
Lead Channel Impedance Value: 475 Ohm
Lead Channel Impedance Value: 551 Ohm
Lead Channel Impedance Value: 684 Ohm
Lead Channel Impedance Value: 798 Ohm
Lead Channel Impedance Value: 836 Ohm
Lead Channel Impedance Value: 874 Ohm
Lead Channel Impedance Value: 988 Ohm
Lead Channel Pacing Threshold Amplitude: 0.625 V
Lead Channel Pacing Threshold Amplitude: 1.625 V
Lead Channel Pacing Threshold Amplitude: 2.5 V
Lead Channel Pacing Threshold Pulse Width: 0.4 ms
Lead Channel Pacing Threshold Pulse Width: 0.4 ms
Lead Channel Pacing Threshold Pulse Width: 0.8 ms
Lead Channel Sensing Intrinsic Amplitude: 0.75 mV
Lead Channel Sensing Intrinsic Amplitude: 0.75 mV
Lead Channel Sensing Intrinsic Amplitude: 18.75 mV
Lead Channel Sensing Intrinsic Amplitude: 18.75 mV
Lead Channel Setting Pacing Amplitude: 2 V
Lead Channel Setting Pacing Amplitude: 2.75 V
Lead Channel Setting Pacing Amplitude: 4 V
Lead Channel Setting Pacing Pulse Width: 0.8 ms
Lead Channel Setting Pacing Pulse Width: 1 ms
Lead Channel Setting Sensing Sensitivity: 0.9 mV

## 2018-09-06 ENCOUNTER — Encounter: Payer: Self-pay | Admitting: Cardiology

## 2018-09-06 NOTE — Progress Notes (Signed)
Remote pacemaker transmission.   

## 2018-09-09 ENCOUNTER — Emergency Department (HOSPITAL_BASED_OUTPATIENT_CLINIC_OR_DEPARTMENT_OTHER)
Admission: EM | Admit: 2018-09-09 | Discharge: 2018-09-09 | Disposition: A | Discharge status: Home / Self Care | Payer: Medicare Other | Attending: Emergency Medicine | Admitting: Emergency Medicine

## 2018-09-09 ENCOUNTER — Other Ambulatory Visit: Payer: Self-pay

## 2018-09-09 ENCOUNTER — Encounter (HOSPITAL_BASED_OUTPATIENT_CLINIC_OR_DEPARTMENT_OTHER): Payer: Self-pay

## 2018-09-09 DIAGNOSIS — Y9389 Activity, other specified: Secondary | ICD-10-CM | POA: Insufficient documentation

## 2018-09-09 DIAGNOSIS — Y999 Unspecified external cause status: Secondary | ICD-10-CM | POA: Diagnosis not present

## 2018-09-09 DIAGNOSIS — S81811A Laceration without foreign body, right lower leg, initial encounter: Secondary | ICD-10-CM | POA: Diagnosis present

## 2018-09-09 DIAGNOSIS — I5022 Chronic systolic (congestive) heart failure: Secondary | ICD-10-CM | POA: Insufficient documentation

## 2018-09-09 DIAGNOSIS — I110 Hypertensive heart disease with heart failure: Secondary | ICD-10-CM | POA: Diagnosis not present

## 2018-09-09 DIAGNOSIS — Z85828 Personal history of other malignant neoplasm of skin: Secondary | ICD-10-CM | POA: Insufficient documentation

## 2018-09-09 DIAGNOSIS — W228XXA Striking against or struck by other objects, initial encounter: Secondary | ICD-10-CM | POA: Insufficient documentation

## 2018-09-09 DIAGNOSIS — E039 Hypothyroidism, unspecified: Secondary | ICD-10-CM | POA: Insufficient documentation

## 2018-09-09 DIAGNOSIS — Z79899 Other long term (current) drug therapy: Secondary | ICD-10-CM | POA: Diagnosis not present

## 2018-09-09 DIAGNOSIS — Y92810 Car as the place of occurrence of the external cause: Secondary | ICD-10-CM | POA: Insufficient documentation

## 2018-09-09 DIAGNOSIS — Z950 Presence of cardiac pacemaker: Secondary | ICD-10-CM | POA: Insufficient documentation

## 2018-09-09 DIAGNOSIS — Z7901 Long term (current) use of anticoagulants: Secondary | ICD-10-CM | POA: Insufficient documentation

## 2018-09-09 MED ORDER — LIDOCAINE-EPINEPHRINE-TETRACAINE (LET) SOLUTION
3.0000 mL | Freq: Once | NASAL | Status: AC
  Administered 2018-09-09: 3 mL via TOPICAL
  Filled 2018-09-09: qty 3

## 2018-09-09 MED ORDER — ALUM & MAG HYDROXIDE-SIMETH 200-200-20 MG/5ML PO SUSP
ORAL | Status: AC
  Filled 2018-09-09: qty 30

## 2018-09-09 MED ORDER — LIDOCAINE VISCOUS HCL 2 % MT SOLN
OROMUCOSAL | Status: AC
  Filled 2018-09-09: qty 15

## 2018-09-09 NOTE — ED Provider Notes (Signed)
[facility] EMERGENCY DEPARTMENT Provider Note   CSN: [id number] Arrival date & time: 09/09/18  2005     History   Chief Complaint Chief Complaint  Patient presents with  . Leg Injury    HPI [name] is a 63 y.o. female with history of A. fib currently on Eliquis, hypertension, pacemaker, Raynaud's, essential tremor presents today for skin tear of the right lower leg.  Patient reports that she was stepping out of her vehicle 2 hours prior to arrival today when she closed the door too quickly striking the right lower leg with the corner of the door.  Patient reports that she had immediate sharp sensation moderate intensity that has gradually improved since onset, nonradiating.  Bleeding was controlled prior to arrival with direct pressure.  She denies any other injury or concerns today.  Reports tetanus shot up-to-date within 1 year.     HPI  Past Medical History:  Diagnosis Date  . AF (paroxysmal atrial fibrillation) ([place])   . Arthritis   . Biventricular cardiac pacemaker in situ    a. prior CRT-D in 2013 with downgrade to CRT-Pacemaker only in 11/2016.  Marland Kitchen Chronic systolic CHF (congestive heart failure) ([place])   . Complication of anesthesia    aspiration  . Essential tremor   . Fibroids   . GERD (gastroesophageal reflux disease)    otc  . Hypertension   . Hyperthyroidism   . Hypothyroidism   . LBBB (left bundle branch block)    conduction disorder  . Migraines   . Mild aortic insufficiency   . NICM (nonischemic cardiomyopathy) ([place])    a. 2012: normal coronaries, EF 25-30%. b. improved to 55% 11/2016.  . Osteoporosis   . Pleural effusion, right    thoracentesis 02/09/09   . Raynaud's disease   . Scoliosis   . 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 Long term current use of anticoagulant therapy 10/23/2017  . 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 Trigeminal neuralgia  12/14/2012  . Insomnia, persistent 11/03/2011  . AICD (automatic cardioverter/defibrillator) present   . Aortic regurgitation   . Nonischemic cardiomyopathy ([place]) 01/21/2011  . Esophagitis, reflux 08/24/2010  . Idiopathic peripheral neuropathy 08/24/2010  . Paroxysmal atrial fibrillation ([place]) 02/23/2010  . Hypothyroidism   . History of migraine headaches   . LBBB   . Scoliosis     Class: Chronic  . Lumbar canal stenosis 12/25/2009  . Anxiety state 07/24/2008  . Avitaminosis D 03/21/2008  . Atypical migraine 01/30/2008  . Benign essential HTN 01/30/2008  . Paroxysmal digital cyanosis 02/15/2007    Past Surgical History:  Procedure Laterality Date  . ARTERY BIOPSY  12/29/2011   Procedure: MINOR BIOPSY TEMPORAL ARTERY;  Surgeon: Edward Jolly, MD;  Location: [facility];  Service: General;  Laterality: Right;  Right temporal artery biopsy  . BACK SURGERY  12/31/2009   "for flat back syndrome; fused bottom of my back"  . BI-VENTRICULAR IMPLANTABLE CARDIOVERTER DEFIBRILLATOR Left 01/20/2011   Procedure: BI-VENTRICULAR IMPLANTABLE CARDIOVERTER DEFIBRILLATOR  (CRT-D);  Surgeon: Evans Lance, MD;  Location: [facility] CATH LAB;  Service: Cardiovascular;  Laterality: Left;  . BIV ICD GENERATOR CHANGEOUT N/A 11/24/2016   Procedure: BIV ICD GENERATOR CHANGEOUT;  Surgeon: Evans Lance, MD;  Location: [facility] CV LAB;  Service: Cardiovascular;  Laterality: N/A;  . CHOLECYSTECTOMY  ~1996  . FOOT NEUROMA SURGERY  ~  2003   right  . ROTATOR CUFF REPAIR  ~ 2009   left  . SHOULDER ARTHROSCOPY W/ ROTATOR CUFF REPAIR  09/21/2011  . SKIN CANCER EXCISION     nose, forehead, left leg  . TEE WITHOUT CARDIOVERSION N/A 03/01/2017   Procedure: TRANSESOPHAGEAL ECHOCARDIOGRAM (TEE);  Surgeon: Skeet Latch, MD;  Location: [facility];  Service: Cardiovascular;  Laterality: N/A;  . THYROID LOBECTOMY  ~ 2006   right  . TUBAL LIGATION  ~ 1983  . VAGINAL HYSTERECTOMY  ~ 2009  .  VESICOVAGINAL FISTULA CLOSURE W/ TAH       OB History   No obstetric history on file.      Home Medications    Prior to Admission medications   Medication Sig Start Date End Date Taking? Authorizing Provider  amitriptyline (ELAVIL) 25 MG tablet Take 82m by mouth every night 09/10/16  Yes [provider]  CALCIUM PO Take 2 tablets by mouth daily.   Yes [provider]  Carboxymethylcellul-Glycerin (LUBRICATING EYE DROPS OP) Place 1 drop into both eyes 2 (two) times daily.    Yes [provider]  Cyanocobalamin (B-12 COMPLIANCE INJECTION) 1000 MCG/ML KIT Inject 1,000 mcg every 30 (thirty) days as directed.   Yes [provider]  eletriptan (RELPAX) 40 MG tablet Take 40 mg by mouth daily as needed for migraine. May repeat in 2 hours if necessary   Yes [provider]  ELIQUIS 5 MG TABS tablet Take 1 tablet (5 mg total) by mouth 2 (two) times daily. 04/11/18  Yes TEvans Lance MD  fluticasone ([facility] 50 MCG/ACT nasal spray Place 1 spray into both nostrils daily as needed for allergies or rhinitis.   Yes [provider]  gabapentin (NEURONTIN) 600 MG tablet Take 1 tablet (600 mg total) by mouth 3 (three) times daily. Patient taking differently: Take 600 mg 2 (two) times daily by mouth.  12/14/12  Yes Penumalli, VEarlean Polka MD  levothyroxine (SYNTHROID, LEVOTHROID) 100 MCG tablet Take 100 mcg daily before breakfast by mouth.   Yes [provider]  Melatonin 3 MG TABS Take 9 mg by mouth at bedtime.    Yes [provider]  montelukast (SINGULAIR) 10 MG tablet Take 10 mg by mouth daily. 07/05/18  Yes [provider]  polyethylene glycol (MIRALAX / GLYCOLAX) packet Take 17 g every evening by mouth.   Yes [provider]  propranolol (INDERAL) 60 MG tablet Take 60 mg by mouth daily.   Yes [provider]  spironolactone (ALDACTONE) 25 MG tablet Take 0.5 tablets (12.5 mg total) by mouth 2 (two) times  daily. 03/16/18  Yes CBuford Dresser MD  VITAMIN D PO Take 2 capsules by mouth daily.   Yes [provider]  AJOVY 225 MG/1.5ML SOAJ Inject 225 mg into the skin every 30 (thirty) days. 06/11/18   [provider]  diphenhydrAMINE (BENADRYL) 25 MG tablet Take 25 mg at bedtime by mouth.    [provider]    Family History Family History  Problem Relation Age of Onset  . Heart disease Mother   . Heart failure Mother 888      CHF  . Stroke Mother   . Hypertension Mother   . Heart disease Father 93 . Macular degeneration Father   . Heart disease Sister   . Atrial fibrillation Sister   . Heart disease Sister     Social History Social History   Tobacco Use  . Smoking status: Never  Smoker  . Smokeless tobacco: Never Used  Substance Use Topics  . Alcohol use: No  . Drug use: No     Allergies   Other, Tape, Codeine, Fentanyl, Morphine and related, and Robaxin [methocarbamol]   Review of Systems Review of Systems Ten systems are reviewed and are negative for acute change except as noted in the HPI  Physical Exam Updated Vital Signs BP (!) 105/52 (BP Location: Right Arm)   Pulse (!) 55   Temp 97.8 F (36.6 C) (Oral)   Resp 18   Ht 5' 7.5" (1.715 m)   Wt 71.2 kg   SpO2 98%   BMI 24.23 kg/m   Physical Exam Constitutional:      General: She is not in acute distress.    Appearance: Normal appearance. She is well-developed. She is not ill-appearing or diaphoretic.  HENT:     Head: Normocephalic and atraumatic.     Right Ear: External ear normal.     Left Ear: External ear normal.     Nose: Nose normal.  Eyes:     General: Vision grossly intact. Gaze aligned appropriately.     Pupils: Pupils are equal, round, and reactive to light.  Neck:     Musculoskeletal: Normal range of motion.     Trachea: Trachea and phonation normal. No tracheal deviation.  Pulmonary:     Effort: Pulmonary effort is normal. No respiratory distress.   Abdominal:     General: There is no distension.     Palpations: Abdomen is soft.     Tenderness: There is no abdominal tenderness. There is no guarding or rebound.  Musculoskeletal: Normal range of motion.  Skin:    General: Skin is warm and dry.          Comments: 3 cm x 3 cm arrow shaped skin tear of the right posterior lower leg.  Neurological:     Mental Status: She is alert.     GCS: GCS eye subscore is 4. GCS verbal subscore is 5. GCS motor subscore is 6.     Comments: Speech is clear and goal oriented, follows commands Major Cranial nerves without deficit, no facial droop Moves extremities without ataxia, coordination intact  Psychiatric:        Behavior: Behavior normal.      ED Treatments / Results  Labs (all labs ordered are listed, but only abnormal results are displayed) Labs Reviewed - No data to display  EKG None  Radiology No results found.  Procedures .Marland KitchenLaceration Repair  Date/Time: 09/09/2018 10:19 PM Performed by: Deliah Boston, PA-C Authorized by: Deliah Boston, PA-C   Consent:    Consent obtained:  Verbal   Consent given by:  Patient   Risks discussed:  Infection, need for additional repair, nerve damage, pain, poor cosmetic result, poor wound healing, retained foreign body, tendon damage and vascular damage Anesthesia (see MAR for exact dosages):    Anesthesia method:  Topical application   Topical anesthetic:  LET Laceration details:    Location:  Leg   Leg location:  R lower leg   Length (cm):  6   Depth (mm):  2 Repair type:    Repair type:  Simple Pre-procedure details:    Preparation:  Patient was prepped and draped in usual sterile fashion Exploration:    Hemostasis achieved with:  LET and direct pressure   Wound exploration: wound explored through full range of motion and entire depth of wound probed and visualized  Wound extent: no foreign bodies/material noted, no muscle damage noted, no nerve damage noted, no  tendon damage noted, no underlying fracture noted and no vascular damage noted     Contaminated: no   Treatment:    Area cleansed with:  Saline   Amount of cleaning:  Standard   Irrigation method:  Pressure wash Skin repair:    Repair method:  Steri-Strips (Derma-Clips, not Steri-Strips)   Number of Steri-Strips:  3 Approximation:    Approximation:  Close Post-procedure details:    Dressing:  Non-adherent dressing and sterile dressing   Patient tolerance of procedure:  Tolerated well, no immediate complications   (including critical care time)  Medications Ordered in ED Medications  lidocaine-EPINEPHrine-tetracaine (LET) solution (3 mLs Topical Given 09/09/18 2110)     Initial Impression / Assessment and Plan / ED Course  I have reviewed the triage vital signs and the nursing notes.  Pertinent labs & imaging results that were available during my care of the patient were reviewed by me and considered in my medical decision making (see chart for details).    Shelsey F Mullett is a 63 y.o. female who presents to ED for skin tear of the right lower extremity.  Tetanus up-to-date within 1 year.  Wound thoroughly cleaned in ED today. Wound explored and bottom of wound seen in a bloodless field. Laceration repaired as dictated above.   No antibiotics indicated at this time.  Patient made aware of potential for poor healing of a skin tear and she states understanding.  Patient has regular follow-up appointment PCP in 2 days, I have encouraged her to go to this appointment and during that time have a wound check on her right lower extremity.  Derma-clips to be removed and approximately 1 week by PCP or urgent care.  Patient counseled on home wound care. Patient was urged to return to the Emergency Department for worsening pain, swelling, expanding erythema especially if it streaks away from the affected area, fever, or for any additional concerns.  At this time there does not appear to be  any evidence of an acute emergency medical condition and the patient appears stable for discharge with appropriate outpatient follow up. Diagnosis was discussed with patient who verbalizes understanding of care plan and is agreeable to discharge. I have discussed return precautions with patient and husband who verbalizes understanding of return precautions. Patient encouraged to follow-up with their PCP. All questions answered.  Patient's case discussed with Dr. Maryan Rued who agrees with plan to discharge with follow-up.   Note: Portions of this report may have been transcribed using voice recognition software. Every effort was made to ensure accuracy; however, inadvertent computerized transcription errors may still be present. Final Clinical Impressions(s) / ED Diagnoses   Final diagnoses:  Skin tear of right lower leg without complication, initial encounter    ED Discharge Orders    None       Gari Crown 09/09/18 2222    Blanchie Dessert, MD 09/10/18 2041

## 2018-09-09 NOTE — ED Notes (Signed)
ED Provider at bedside. 

## 2018-09-09 NOTE — Discharge Instructions (Addendum)
You have been diagnosed today with skin tear of the right lower extremity.  At this time there does not appear to be the presence of an emergent medical condition, however there is always the potential for conditions to change. Please read and follow the below instructions.  Please return to the Emergency Department immediately for any new or worsening symptoms. Please be sure to follow up with your Primary Care Provider within one week regarding your visit today; please call their office to schedule an appointment even if you are feeling better for a follow-up visit. Please go to your primary care doctor's appointment in 2 days as scheduled for wound recheck.  Get help right away if: You have a red streak going away from your wound. The edges of the wound open up and separate. Your wound is bleeding, and the bleeding does not stop with gentle pressure. You have a rash. You faint. You have trouble breathing. You have any new/concerning or worsening symptoms  Please read the additional information packets attached to your discharge summary.  Do not take your medicine if  develop an itchy rash, swelling in your mouth or lips, or difficulty breathing; call 911 and seek immediate emergency medical attention if this occurs.

## 2018-09-09 NOTE — ED Triage Notes (Signed)
Pt hit her leg with a car door and has a skin tear to posterior leg. Bleeding controlled at this time. Pt is taking Elaquist. Bandage applied to leg in triage.

## 2018-10-02 ENCOUNTER — Telehealth: Payer: Self-pay | Admitting: Cardiology

## 2018-10-02 NOTE — Telephone Encounter (Signed)
It does not sound cardiac in nature. She is on apixaban for her afib, and this would make a blood clot very unlikely. I cannot clearly tell her what the cause might be. I'd recommend she see her PCP to evaluated what else might be causing her to have the pain. There are many things that can cause this type of pain, and it may be best to have her PCP see her in person to evaluate and do labs if needed. Thanks.

## 2018-10-02 NOTE — Telephone Encounter (Signed)
New Message    Patient states for about a week she's had a pain under her left arm in her armpit.  Patient states it hurst in the am when she gets out of bed and she feels it throughout the day.  Please call patient to advise. ?

## 2018-10-02 NOTE — Telephone Encounter (Signed)
Patient states about a year ago she had this same pain and it was a blood clot in her spleen and she would like to know if she should go back to the ER to be evaluated or what she should do right now. Patient denies chest pain, SOB, or swelling. She states she does not think it is cardiac related, but it feels the same as a year ago- and has been causing her pain for about a week.   Patient states BP and HR were fine last week at the doctor, and has had no other issues with those. She states it normally stays low. Patient denies palpitations, patient has appointment on Monday to check her pacemaker, she would just like to know what she should do in this case by Dr.Christopher. Advised I could route a message over to her for her opinion.

## 2018-10-02 NOTE — Telephone Encounter (Signed)
Called patient, advised of note from MD. Patient verbalized understanding.  

## 2018-10-08 ENCOUNTER — Ambulatory Visit (INDEPENDENT_AMBULATORY_CARE_PROVIDER_SITE_OTHER): Payer: Medicare Other | Admitting: Internal Medicine

## 2018-10-08 ENCOUNTER — Other Ambulatory Visit: Payer: Self-pay

## 2018-10-08 ENCOUNTER — Encounter: Payer: Self-pay | Admitting: Internal Medicine

## 2018-10-08 VITALS — BP 116/62 | HR 57 | Ht 67.5 in | Wt 155.6 lb

## 2018-10-08 DIAGNOSIS — I447 Left bundle-branch block, unspecified: Secondary | ICD-10-CM

## 2018-10-08 DIAGNOSIS — Z950 Presence of cardiac pacemaker: Secondary | ICD-10-CM | POA: Insufficient documentation

## 2018-10-08 DIAGNOSIS — I428 Other cardiomyopathies: Secondary | ICD-10-CM

## 2018-10-08 LAB — CUP PACEART INCLINIC DEVICE CHECK
Battery Remaining Longevity: 97 mo
Battery Voltage: 3 V
Brady Statistic AP VP Percent: 1.3 %
Brady Statistic AP VS Percent: 0.04 %
Brady Statistic AS VP Percent: 96.95 %
Brady Statistic AS VS Percent: 1.7 %
Brady Statistic RA Percent Paced: 1.36 %
Brady Statistic RV Percent Paced: 1.32 %
Date Time Interrogation Session: 20200928181202
Implantable Lead Implant Date: 20130110
Implantable Lead Implant Date: 20130110
Implantable Lead Implant Date: 20130110
Implantable Lead Location: 753858
Implantable Lead Location: 753859
Implantable Lead Location: 753860
Implantable Lead Model: 4196
Implantable Lead Model: 5076
Implantable Lead Model: 6935
Implantable Pulse Generator Implant Date: 20181115
Lead Channel Impedance Value: 1083 Ohm
Lead Channel Impedance Value: 1482 Ohm
Lead Channel Impedance Value: 380 Ohm
Lead Channel Impedance Value: 456 Ohm
Lead Channel Impedance Value: 684 Ohm
Lead Channel Impedance Value: 760 Ohm
Lead Channel Impedance Value: 779 Ohm
Lead Channel Impedance Value: 798 Ohm
Lead Channel Impedance Value: 950 Ohm
Lead Channel Pacing Threshold Amplitude: 0.75 V
Lead Channel Pacing Threshold Amplitude: 1.5 V
Lead Channel Pacing Threshold Amplitude: 2.25 V
Lead Channel Pacing Threshold Pulse Width: 0.4 ms
Lead Channel Pacing Threshold Pulse Width: 0.8 ms
Lead Channel Pacing Threshold Pulse Width: 1 ms
Lead Channel Sensing Intrinsic Amplitude: 19.75 mV
Lead Channel Sensing Intrinsic Amplitude: 2.125 mV
Lead Channel Setting Pacing Amplitude: 2 V
Lead Channel Setting Pacing Amplitude: 2.75 V
Lead Channel Setting Pacing Amplitude: 4 V
Lead Channel Setting Pacing Pulse Width: 0.8 ms
Lead Channel Setting Pacing Pulse Width: 1 ms
Lead Channel Setting Sensing Sensitivity: 0.9 mV

## 2018-10-08 NOTE — Patient Instructions (Signed)
Medication Instructions:  Your physician recommends that you continue on your current medications as directed. Please refer to the Current Medication list given to you today.  Labwork: None ordered.  Testing/Procedures: None ordered.  Follow-Up: Your physician wants you to follow-up in: one year with Dr. Lovena Le.   You will receive a reminder letter in the mail two months in advance. If you don't receive a letter, please call our office to schedule the follow-up appointment.  Remote monitoring is used to monitor your Pacemaker from home. This monitoring reduces the number of office visits required to check your device to one time per year. It allows [place] to keep an eye on the functioning of your device to ensure it is working properly. You are scheduled for a device check from home on 11/27/2018. You may send your transmission at any time that day. If you have a wireless device, the transmission will be sent automatically. After your physician reviews your transmission, you will receive a postcard with your next transmission date.  Any Other Special Instructions Will Be Listed Below (If Applicable).  If you need a refill on your cardiac medications before your next appointment, please call your pharmacy.

## 2018-10-08 NOTE — Progress Notes (Signed)
HPI Ms. Convery returns today for followup. She is a pleasant 63 yo woman with a h/o PAF, HTN,  Chronic systolic heart failure who has lost 10 lbs since her last visit. She has begun walking and has not had palpitations. No edema. No syncope.  Allergies  Allergen Reactions  . Other Nausea And Vomiting and Other (See Comments)    Most narcotic pain meds cause N/V and CHEST PAIN; needs an antiemetic to tolerate  Also, patient was diagnosed with Raynaud's disease and was told to NOT place IV(s) in either hand because of poor circulation  . Tape Other (See Comments)    PATIENT'S SKIN IS VERY THIN; IT TEARS, BRUISES, AND BLEEDS VERY EASILY (PLEASE USE COBAN WRAP, IF POSSIBLE!!)  . Codeine Other (See Comments)    Chest pain  . Fentanyl Other (See Comments)    Bad dreams  . Morphine And Related Nausea And Vomiting  . Robaxin [Methocarbamol] Other (See Comments)    Chest pain     Current Outpatient Medications  Medication Sig Dispense Refill  . amitriptyline (ELAVIL) 25 MG tablet Take 59m by mouth every night  5  . CALCIUM PO Take 2 tablets by mouth daily.    . Carboxymethylcellul-Glycerin (LUBRICATING EYE DROPS OP) Place 1 drop into both eyes 2 (two) times daily.     . Cyanocobalamin (B-12 COMPLIANCE INJECTION) 1000 MCG/ML KIT Inject 1,000 mcg every 30 (thirty) days as directed.    . eletriptan (RELPAX) 40 MG tablet Take 40 mg by mouth daily as needed for migraine. May repeat in 2 hours if necessary    . ELIQUIS 5 MG TABS tablet Take 1 tablet (5 mg total) by mouth 2 (two) times daily. 60 tablet 6  . gabapentin (NEURONTIN) 600 MG tablet Take 600 mg by mouth 2 (two) times daily.    .Marland Kitchenlevothyroxine (SYNTHROID, LEVOTHROID) 100 MCG tablet Take 100 mcg daily before breakfast by mouth.    . Melatonin 3 MG TABS Take 9 mg by mouth at bedtime.     . montelukast (SINGULAIR) 10 MG tablet Take 10 mg by mouth daily.    . polyethylene glycol (MIRALAX / GLYCOLAX) packet Take 17 g every evening by  mouth.    . propranolol (INDERAL) 60 MG tablet Take 60 mg by mouth daily.    .Marland Kitchenspironolactone (ALDACTONE) 25 MG tablet Take 0.5 tablets (12.5 mg total) by mouth 2 (two) times daily. 90 tablet 2  . triamcinolone cream (KENALOG) 0.1 % 1 APPLICATION ONCE A DAY EXTERNALLY 30 DAYS    . VITAMIN D PO Take 2 capsules by mouth daily.     No current facility-administered medications for this visit.      Past Medical History:  Diagnosis Date  . [place]) [place]   . Arthritis   . Biventricular cardiac pacemaker in situ    a. prior CRT-D in 2013 with downgrade to CRT-Pacemaker only in 11/2016.  .Marland KitchenChronic systolic CHF (congestive heart failure) ([place]   . Complication of anesthesia    aspiration  . Essential tremor   . Fibroids   . GERD (gastroesophageal reflux disease)    otc  . Hypertension   . Hyperthyroidism   . Hypothyroidism   . LBBB (left bundle branch block)    conduction disorder  . Migraines   . Mild aortic insufficiency   . NICM (nonischemic cardiomyopathy) ([place]    a. 2012: normal coronaries, EF 25-30%. b. improved to 55% 11/2016.  .Marland Kitchen  Osteoporosis   . Pleural effusion, right    thoracentesis 02/09/09   . Raynaud's disease   . Scoliosis   . 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   Past Surgical History:  Procedure Laterality Date  . ARTERY BIOPSY  12/29/2011   Procedure: MINOR BIOPSY TEMPORAL ARTERY;  Surgeon: Edward Jolly, MD;  Location: [facility];  Service: General;  Laterality: Right;  Right temporal artery biopsy  . BACK SURGERY  12/31/2009   "for flat back syndrome; fused bottom of my back"  . BI-VENTRICULAR IMPLANTABLE CARDIOVERTER DEFIBRILLATOR Left 01/20/2011   Procedure: BI-VENTRICULAR IMPLANTABLE CARDIOVERTER DEFIBRILLATOR  (CRT-D);  Surgeon: Evans Lance, MD;  Location: [facility] CATH LAB;  Service: Cardiovascular;   Laterality: Left;  . BIV ICD GENERATOR CHANGEOUT N/A 11/24/2016   Procedure: BIV ICD GENERATOR CHANGEOUT;  Surgeon: Evans Lance, MD;  Location: [facility] CV LAB;  Service: Cardiovascular;  Laterality: N/A;  . CHOLECYSTECTOMY  ~1996  . FOOT NEUROMA SURGERY  ~ 2003   right  . ROTATOR CUFF REPAIR  ~ 2009   left  . SHOULDER ARTHROSCOPY W/ ROTATOR CUFF REPAIR  09/21/2011  . SKIN CANCER EXCISION     nose, forehead, left leg  . TEE WITHOUT CARDIOVERSION N/A 03/01/2017   Procedure: TRANSESOPHAGEAL ECHOCARDIOGRAM (TEE);  Surgeon: Skeet Latch, MD;  Location: [facility];  Service: Cardiovascular;  Laterality: N/A;  . THYROID LOBECTOMY  ~ 2006   right  . TUBAL LIGATION  ~ 1983  . VAGINAL HYSTERECTOMY  ~ 2009  . VESICOVAGINAL FISTULA CLOSURE W/ TAH       Family History  Problem Relation Age of Onset  . Heart disease Mother   . Heart failure Mother 8       CHF  . Stroke Mother   . Hypertension Mother   . Heart disease Father 3  . Macular degeneration Father   . Heart disease Sister   . Atrial fibrillation Sister   . Heart disease Sister      Social History   Socioeconomic History  . Marital status: Married    Spouse name: Ronalee Belts  . Number of children: 1  . Years of education: MA  . Highest education level: Not on file  Occupational History  . Occupation: Pharmacist, hospital    Comment: n/a  Social Needs  . Financial resource strain: Not on file  . Food insecurity    Worry: Not on file    Inability: Not on file  . Transportation needs    Medical: Not on file    Non-medical: Not on file  Tobacco Use  . Smoking status: Never Smoker  . Smokeless tobacco: Never Used  Substance and Sexual Activity  . Alcohol use: No  . Drug use: No  . Sexual activity: Yes  Lifestyle  . Physical activity    Days per week: Not on file    Minutes per session: Not on file  . Stress: Not on file  Relationships  . Social Herbalist on phone: Not on file    Gets together: Not on  file    Attends religious service: Not on file    Active member of club or organization: Not on file    Attends meetings of clubs or organizations: Not on file    Relationship status: Not on file  . Intimate partner  violence    Fear of current or ex partner: Not on file    Emotionally abused: Not on file    Physically abused: Not on file    Forced sexual activity: Not on file  Other Topics Concern  . Not on file  Social History Narrative   Patient lives at home with family.   Caffeine Use: 1-2 cups daily     BP 116/62   Pulse (!) 57   Ht 5' 7.5" (1.715 m)   Wt 155 lb 9.6 oz (70.6 kg)   SpO2 96%   BMI 24.01 kg/m   Physical Exam:  Well appearing NAD HEENT: Unremarkable Neck:  No JVD, no thyromegally Lymphatics:  No adenopathy Back:  No CVA tenderness Lungs:  Clear with no wheezes HEART:  Regular rate rhythm, no murmurs, no rubs, no clicks Abd:  soft, positive bowel sounds, no organomegally, no rebound, no guarding Ext:  2 plus pulses, no edema, no cyanosis, no clubbing Skin:  No rashes no nodules Neuro:  CN II through XII intact, motor grossly intact  EKG - nsr with biv pacing  DEVICE  Normal device function.  See PaceArt for details.   Assess/Plan: 1. Chronic systolic heart failure - her symptoms are class 1. She will continue her current meds. 2. Peripheral neuropathy - this is what limits her most. She will continue to walk. 3. PPM - her Medtronic Biv PPM is working normally.   Mikle Bosworth.D.

## 2018-10-29 ENCOUNTER — Other Ambulatory Visit: Payer: Self-pay | Admitting: Family Medicine

## 2018-10-29 DIAGNOSIS — M810 Age-related osteoporosis without current pathological fracture: Secondary | ICD-10-CM

## 2018-11-27 ENCOUNTER — Ambulatory Visit (INDEPENDENT_AMBULATORY_CARE_PROVIDER_SITE_OTHER): Payer: Medicare Other | Admitting: *Deleted

## 2018-11-27 DIAGNOSIS — I447 Left bundle-branch block, unspecified: Secondary | ICD-10-CM

## 2018-11-27 DIAGNOSIS — I480 Paroxysmal atrial fibrillation: Secondary | ICD-10-CM

## 2018-11-28 LAB — CUP PACEART REMOTE DEVICE CHECK
Battery Remaining Longevity: 100 mo
Battery Voltage: 3 V
Brady Statistic AP VP Percent: 1.59 %
Brady Statistic AP VS Percent: 0.05 %
Brady Statistic AS VP Percent: 96.66 %
Brady Statistic AS VS Percent: 1.7 %
Brady Statistic RA Percent Paced: 1.64 %
Brady Statistic RV Percent Paced: 0.88 %
Date Time Interrogation Session: 20201117014522
Implantable Lead Implant Date: 20130110
Implantable Lead Implant Date: 20130110
Implantable Lead Implant Date: 20130110
Implantable Lead Location: 753858
Implantable Lead Location: 753859
Implantable Lead Location: 753860
Implantable Lead Model: 4196
Implantable Lead Model: 5076
Implantable Lead Model: 6935
Implantable Pulse Generator Implant Date: 20181115
Lead Channel Impedance Value: 1368 Ohm
Lead Channel Impedance Value: 380 Ohm
Lead Channel Impedance Value: 437 Ohm
Lead Channel Impedance Value: 608 Ohm
Lead Channel Impedance Value: 646 Ohm
Lead Channel Impedance Value: 665 Ohm
Lead Channel Impedance Value: 703 Ohm
Lead Channel Impedance Value: 893 Ohm
Lead Channel Impedance Value: 988 Ohm
Lead Channel Pacing Threshold Amplitude: 0.625 V
Lead Channel Pacing Threshold Amplitude: 1.625 V
Lead Channel Pacing Threshold Amplitude: 1.875 V
Lead Channel Pacing Threshold Pulse Width: 0.4 ms
Lead Channel Pacing Threshold Pulse Width: 0.4 ms
Lead Channel Pacing Threshold Pulse Width: 0.8 ms
Lead Channel Sensing Intrinsic Amplitude: 0.625 mV
Lead Channel Sensing Intrinsic Amplitude: 0.625 mV
Lead Channel Sensing Intrinsic Amplitude: 18.125 mV
Lead Channel Sensing Intrinsic Amplitude: 18.125 mV
Lead Channel Setting Pacing Amplitude: 2 V
Lead Channel Setting Pacing Amplitude: 2.5 V
Lead Channel Setting Pacing Amplitude: 4 V
Lead Channel Setting Pacing Pulse Width: 0.8 ms
Lead Channel Setting Pacing Pulse Width: 1 ms
Lead Channel Setting Sensing Sensitivity: 0.9 mV

## 2018-12-05 ENCOUNTER — Other Ambulatory Visit: Payer: Self-pay | Admitting: Cardiology

## 2018-12-25 NOTE — Progress Notes (Signed)
Remote pacemaker transmission.   

## 2019-01-14 ENCOUNTER — Encounter: Payer: Self-pay | Admitting: Cardiology

## 2019-01-14 ENCOUNTER — Other Ambulatory Visit: Payer: Self-pay

## 2019-01-14 ENCOUNTER — Encounter (INDEPENDENT_AMBULATORY_CARE_PROVIDER_SITE_OTHER): Payer: Self-pay

## 2019-01-14 ENCOUNTER — Ambulatory Visit (INDEPENDENT_AMBULATORY_CARE_PROVIDER_SITE_OTHER): Payer: Medicare PPO | Admitting: Cardiology

## 2019-01-14 VITALS — BP 104/63 | HR 49 | Ht 67.0 in | Wt 160.0 lb

## 2019-01-14 DIAGNOSIS — I428 Other cardiomyopathies: Secondary | ICD-10-CM | POA: Diagnosis not present

## 2019-01-14 DIAGNOSIS — Z950 Presence of cardiac pacemaker: Secondary | ICD-10-CM

## 2019-01-14 DIAGNOSIS — Z7901 Long term (current) use of anticoagulants: Secondary | ICD-10-CM

## 2019-01-14 DIAGNOSIS — I447 Left bundle-branch block, unspecified: Secondary | ICD-10-CM | POA: Diagnosis not present

## 2019-01-14 DIAGNOSIS — I10 Essential (primary) hypertension: Secondary | ICD-10-CM

## 2019-01-14 DIAGNOSIS — I480 Paroxysmal atrial fibrillation: Secondary | ICD-10-CM

## 2019-01-14 NOTE — Patient Instructions (Signed)

## 2019-01-14 NOTE — Progress Notes (Signed)
Cardiology Office Note:    Date:  01/14/2019   ID:  GIFT RUECKERT, DOB 10-Jun-1955, MRN [id number]  PCP:  [name], MD  Cardiologist:  Buford Dresser, MD  Referring MD: [name], MD   CC: follow up  History of Present Illness:    [name] is a 64 y.o. female with a hx of hx of LBBB, NICM s/p CRT-D-->Pwith normalization of EF, paroxysmal atrial fibrillation.   Today: Has significant bruising on apixaban, asking about dropping dose. Discussed criteria for 2.5 mg BID dose, which she does not meet. No GI or GU bleeding, though has been having more frequent UTIs. Has not discussed with her PCP, as she was recently changed to a new provider.  Has worked on weight loss, down to 155 lbs. Congratulated on this.   Denies chest pain, shortness of breath at rest or with normal exertion. No PND, orthopnea, LE edema or unexpected weight gain. No syncope or palpitations. No dizziness/lightheadedness, but has rare episodes of extreme carsickness that require her to have someone else come and pick her up. She rarely drives because of this. Recommended to discuss whether something like meclizine would help.  Asking about cutting back medications. Reviewed spironolatone and propranolol today. She is hesitant to change these; spironolactone has cut back on her LE edema (now none), propranolol also helps with her tremor.    Past Medical History:  Diagnosis Date  . AF (paroxysmal atrial fibrillation) ([place])   . Arthritis   . Biventricular cardiac pacemaker in situ    a. prior CRT-D in 2013 with downgrade to CRT-Pacemaker only in 11/2016.  Marland Kitchen Chronic systolic CHF (congestive heart failure) ([place])   . Complication of anesthesia    aspiration  . Essential tremor   . Fibroids   . GERD (gastroesophageal reflux disease)    otc  . Hypertension   . Hyperthyroidism   . Hypothyroidism   . LBBB (left bundle branch block)    conduction disorder  . Migraines   . Mild aortic  insufficiency   . NICM (nonischemic cardiomyopathy) ([place])    a. 2012: normal coronaries, EF 25-30%. b. improved to 55% 11/2016.  . Osteoporosis   . Pleural effusion, right    thoracentesis 02/09/09   . Raynaud's disease   . Scoliosis   . 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 ARTERY BIOPSY  12/29/2011   Procedure: MINOR BIOPSY TEMPORAL ARTERY;  Surgeon: Edward Jolly, MD;  Location: [facility];  Service: General;  Laterality: Right;  Right temporal artery biopsy  . BACK SURGERY  12/31/2009   "for flat back syndrome; fused bottom of my back"  . BI-VENTRICULAR IMPLANTABLE CARDIOVERTER DEFIBRILLATOR Left 01/20/2011   Procedure: BI-VENTRICULAR IMPLANTABLE CARDIOVERTER DEFIBRILLATOR  (CRT-D);  Surgeon: Evans Lance, MD;  Location: [facility] CATH LAB;  Service: Cardiovascular;  Laterality: Left;  . BIV ICD GENERATOR CHANGEOUT N/A 11/24/2016   Procedure: BIV ICD GENERATOR CHANGEOUT;  Surgeon: Evans Lance, MD;  Location: [place] [facility] CV LAB;  Service: Cardiovascular;  Laterality: N/A;  . CHOLECYSTECTOMY  ~1996  . FOOT NEUROMA SURGERY  ~ 2003   right  . ROTATOR CUFF REPAIR  ~ 2009   left  . SHOULDER ARTHROSCOPY W/ ROTATOR CUFF REPAIR  09/21/2011  . SKIN CANCER EXCISION     nose, forehead, left leg  . TEE WITHOUT CARDIOVERSION N/A 03/01/2017   Procedure:  TRANSESOPHAGEAL ECHOCARDIOGRAM (TEE);  Surgeon: Skeet Latch, MD;  Location: [facility];  Service: Cardiovascular;  Laterality: N/A;  . THYROID LOBECTOMY  ~ 2006   right  . TUBAL LIGATION  ~ 1983  . VAGINAL HYSTERECTOMY  ~ 2009  . VESICOVAGINAL FISTULA CLOSURE W/ TAH      Current Medications: Current Outpatient Medications on File Prior to Visit  Medication Sig  . amitriptyline (ELAVIL) 25 MG tablet Take 21m by mouth every night  . CALCIUM PO Take 2 tablets by mouth daily.  . Carboxymethylcellul-Glycerin (LUBRICATING EYE DROPS OP)  Place 1 drop into both eyes 2 (two) times daily.   . Cyanocobalamin (B-12 COMPLIANCE INJECTION) 1000 MCG/ML KIT Inject 1,000 mcg every 30 (thirty) days as directed.  . eletriptan (RELPAX) 40 MG tablet Take 40 mg by mouth daily as needed for migraine. May repeat in 2 hours if necessary  . ELIQUIS 5 MG TABS tablet Take 1 tablet (5 mg total) by mouth 2 (two) times daily.  .Marland Kitchengabapentin (NEURONTIN) 600 MG tablet Take 600 mg by mouth 2 (two) times daily.  .Marland Kitchenlevothyroxine (SYNTHROID, LEVOTHROID) 100 MCG tablet Take 100 mcg daily before breakfast by mouth.  . Melatonin 3 MG TABS Take 9 mg by mouth at bedtime.   . montelukast (SINGULAIR) 10 MG tablet Take 10 mg by mouth daily.  . polyethylene glycol (MIRALAX / GLYCOLAX) packet Take 17 g every evening by mouth.  . propranolol (INDERAL) 60 MG tablet Take 60 mg by mouth daily.  .Marland Kitchenspironolactone (ALDACTONE) 25 MG tablet TAKE 0.5 TABLETS (12.5 MG TOTAL) BY MOUTH 2 (TWO) TIMES DAILY.  .Marland Kitchentriamcinolone cream (KENALOG) 0.1 % as needed.   .Marland KitchenVITAMIN D PO Take 2 capsules by mouth daily.   No current facility-administered medications on file prior to visit.     Allergies:   Other, Tape, Codeine, Fentanyl, Morphine and related, and Robaxin [methocarbamol]   Social History   Tobacco Use  . Smoking status: Never Smoker  . Smokeless tobacco: Never Used  Substance Use Topics  . Alcohol use: No  . Drug use: No    Family History: family history includes Atrial fibrillation in her sister; Heart disease in her mother, sister, and sister; Heart disease (age of onset: 991 in her father; Heart failure (age of onset: 863 in her mother; Hypertension in her mother; Macular degeneration in her father; Stroke in her mother.  ROS:   Please see the history of present illness.  Additional pertinent ROS: Constitutional: Negative for chills, fever, night sweats, unintentional weight loss  HENT: Negative for ear pain and hearing loss.   Eyes: Negative for loss of vision and  eye pain.  Respiratory: Negative for cough, sputum, wheezing.   Cardiovascular: See HPI. Gastrointestinal: Negative for abdominal pain, melena, and hematochezia.  Genitourinary: Negative for dysuria and hematuria.  Musculoskeletal: Negative for falls and myalgias.  Skin: Negative for itching and rash.  Neurological: Negative for focal weakness, focal sensory changes and loss of consciousness.  Endo/Heme/Allergies: Does bruise/bleed easily.    EKGs/Labs/Other Studies Reviewed:    The following studies were reviewed today: TEE 22019/02/12- Left ventricle: Systolic function was normal. Wall motion was normal; there were no regional wall motion abnormalities. - Aortic valve: There was mild regurgitation. - Mitral valve: There was trivial regurgitation. - Left atrium: No evidence of thrombus in the atrial cavity or appendage. No evidence of thrombus in the atrial cavity or appendage. - Right ventricle: The cavity size was normal. Wall thickness was normal.  Systolic function was normal. - Right atrium: No evidence of thrombus in the atrial cavity or appendage. - Atrial septum: A patent foramen ovale is likely. There was no right to left flow at rest but saline microcavitation study was positive with abdominal pressure. The test was repeated to ensure that there was no extracardiac shunt and was negative. - Tricuspid valve: There was trivial regurgitation.  TTE 02/2017 Study Conclusions  - Left ventricle: The cavity size was normal. Wall thickness was normal. Systolic function was normal. The estimated ejection fraction was in the range of 60% to 65%. Wall motion was normal; there were no regional wall motion abnormalities. Doppler parameters are consistent with abnormal left ventricular relaxation (grade 1 diastolic dysfunction). The E/e&' ratio is <8, suggesting normal LV filling pressure. - Aortic valve: Sclerosis without stenosis. There was  trivial regurgitation. - Left atrium: The atrium was normal in size. - Tricuspid valve: There was trivial regurgitation. - Pulmonary arteries: PA peak pressure: 30 mm Hg (S). - Inferior vena cava: The vessel was normal in size. The respirophasic diameter changes were in the normal range (>= 50%), consistent with normal central venous pressure.  Impressions:  - Compared to a prior study in 2018, the LVEF is higher at 60-65% and there is now grade 1 DD with normal LV filling pressure.  EKG:  EKG is personally reviewed.  The ekg ordered 10/08/18 demonstrates atrial-sensed, ventricular-paced rhythm  Recent Labs: No results found for requested labs within last 8760 hours.  Recent Lipid Panel    Component Value Date/Time   CHOL  02/11/2010 0555    103        ATP III CLASSIFICATION:  <200     mg/dL   Desirable  200-239  mg/dL   Borderline High  >=240    mg/dL   High          TRIG 85 02/11/2010 0555   HDL 42 02/11/2010 0555   CHOLHDL 2.5 02/11/2010 0555   VLDL 17 02/11/2010 0555   LDLCALC  02/11/2010 0555    44        Total Cholesterol/HDL:CHD Risk Coronary Heart Disease Risk Table                     Men   Women  1/2 Average Risk   3.4   3.3  Average Risk       5.0   4.4  2 X Average Risk   9.6   7.1  3 X Average Risk  23.4   11.0        Use the calculated Patient Ratio above and the CHD Risk Table to determine the patient's CHD Risk.        ATP III CLASSIFICATION (LDL):  <100     mg/dL   Optimal  100-129  mg/dL   Near or Above                    Optimal  130-159  mg/dL   Borderline  160-189  mg/dL   High  >190     mg/dL   Very High    Physical Exam:    VS:  BP 104/63   Pulse (!) 49   Ht '5\' 7"'  (1.702 m)   Wt 160 lb (72.6 kg)   BMI 25.06 kg/m     Wt Readings from Last 3 Encounters:  01/14/19 160 lb (72.6 kg)  10/08/18 155 lb 9.6 oz (70.6 kg)  09/09/18 157 lb (  71.2 kg)    GEN: Well nourished, well developed in no acute distress HEENT: Normal,  moist mucous membranes NECK: No JVD CARDIAC: regular rhythm, normal S1 and S2, no rubs or gallops. No murmurs. VASCULAR: Radial and DP pulses 2+ bilaterally. No carotid bruits RESPIRATORY:  Clear to auscultation without rales, wheezing or rhonchi  ABDOMEN: Soft, non-tender, non-distended MUSCULOSKELETAL:  Ambulates independently SKIN: Warm and dry, no edema NEUROLOGIC:  Alert and oriented x 3. No focal neuro deficits noted. PSYCHIATRIC:  Normal affect    ASSESSMENT:    1. Nonischemic cardiomyopathy ([place])   2. Left bundle branch block   3. Biventricular cardiac pacemaker in situ   4. Long term current use of anticoagulant therapy   5. AF (paroxysmal atrial fibrillation) ([place])   6. Essential hypertension    PLAN:    Nonischemic cardiomyopathy, LBBB: now EF normalized s/p CRT, follows with Dr. Lovena Le -euvolemic, NYHA class 1 -spironolactone has eliminated her intermittent LE edema -on propranolol 60 mg daily -we discussed medications at length today. She would like to cut back, but when we discussed options (namely propranolol and spironolactone), she was concerned that cutting back may eliminate the benefits she has received from these medications. After shared decision making, no changes made.  Hypertension: now running low normal, though denies any symptoms of hypotension. See above re: medication discussion.  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  -It was never fully determined what caused her splenic infarction, as she had a possible PFO on TEE but also had only very brief afib seen on her device. Dr. Wynonia Lawman previously started apixaban, and she is tolerating this without issue. She prefers to be on anticoagulation vs. Risking another embolic event given the uncertain cause. -did discuss today that she does not meet criteria for reduced dose apixaban at this time. Does bruise easily but no significant  blood loss  Cardiac risk counseling and prevention recommendations: -recommend heart healthy/Mediterranean diet, with whole grains, fruits, vegetable, fish, lean meats, nuts, and olive oil. Limit salt. -recommend moderate walking, 3-5 times/week for 30-50 minutes each session. Aim for at least 150 minutes.week. Goal should be pace of 3 miles/hours, or walking 1.5 miles in 30 minutes -recommend avoidance of tobacco products. Avoid excess alcohol.  Plan for follow up: 6 mos or sooner PRN  Total time of encounter: 35 minutes total time of encounter, including 25 minutes spent in face-to-face patient care. This time includes coordination of care and counseling regarding medication management decisions. Remainder of non-face-to-face time involved reviewing chart documents/testing relevant to the patient encounter and documentation in the medical record.  Buford Dresser, MD, PhD [place]  [facility]   Medication Adjustments/Labs and Tests Ordered: Current medicines are reviewed at length with the patient today.  Concerns regarding medicines are outlined above.  No orders of the defined types were placed in this encounter.  No orders of the defined types were placed in this encounter.   Patient Instructions  Medication Instructions:  Your Physician recommend you continue on your current medication as directed.    *If you need a refill on your cardiac medications before your next appointment, please call your pharmacy*  Lab Work: None  Testing/Procedures: None  Follow-Up: At [facility], you and your health needs are our priority.  As part of our continuing mission to provide you with exceptional heart care, we have created designated Provider Care Teams.  These Care Teams include your primary  Cardiologist (physician) and Advanced Practice Providers (APPs -  Physician Assistants and Nurse Practitioners) who all work together to provide you with the care you need, when you  need it.  Your next appointment:   6 month(s)  The format for your next appointment:   In Person  Provider:   Buford Dresser, MD      Signed, Buford Dresser, MD PhD 01/14/2019 11:00 PM    [place]

## 2019-02-26 ENCOUNTER — Ambulatory Visit (INDEPENDENT_AMBULATORY_CARE_PROVIDER_SITE_OTHER): Payer: Medicare PPO | Admitting: *Deleted

## 2019-02-26 DIAGNOSIS — I447 Left bundle-branch block, unspecified: Secondary | ICD-10-CM

## 2019-02-26 LAB — CUP PACEART REMOTE DEVICE CHECK
Battery Remaining Longevity: 113 mo
Battery Voltage: 2.99 V
Brady Statistic AP VP Percent: 0.5 %
Brady Statistic AP VS Percent: 0.02 %
Brady Statistic AS VP Percent: 97.75 %
Brady Statistic AS VS Percent: 1.73 %
Brady Statistic RA Percent Paced: 0.54 %
Brady Statistic RV Percent Paced: 2.46 %
Date Time Interrogation Session: 20210215221047
Implantable Lead Implant Date: 20130110
Implantable Lead Implant Date: 20130110
Implantable Lead Implant Date: 20130110
Implantable Lead Location: 753858
Implantable Lead Location: 753859
Implantable Lead Location: 753860
Implantable Lead Model: 4196
Implantable Lead Model: 5076
Implantable Lead Model: 6935
Implantable Pulse Generator Implant Date: 20181115
Lead Channel Impedance Value: 1007 Ohm
Lead Channel Impedance Value: 1368 Ohm
Lead Channel Impedance Value: 361 Ohm
Lead Channel Impedance Value: 437 Ohm
Lead Channel Impedance Value: 646 Ohm
Lead Channel Impedance Value: 684 Ohm
Lead Channel Impedance Value: 703 Ohm
Lead Channel Impedance Value: 703 Ohm
Lead Channel Impedance Value: 874 Ohm
Lead Channel Pacing Threshold Amplitude: 0.75 V
Lead Channel Pacing Threshold Amplitude: 1.5 V
Lead Channel Pacing Threshold Amplitude: 2.5 V
Lead Channel Pacing Threshold Pulse Width: 0.4 ms
Lead Channel Pacing Threshold Pulse Width: 0.4 ms
Lead Channel Pacing Threshold Pulse Width: 0.8 ms
Lead Channel Sensing Intrinsic Amplitude: 0.875 mV
Lead Channel Sensing Intrinsic Amplitude: 0.875 mV
Lead Channel Sensing Intrinsic Amplitude: 18.125 mV
Lead Channel Sensing Intrinsic Amplitude: 18.125 mV
Lead Channel Setting Pacing Amplitude: 2 V
Lead Channel Setting Pacing Amplitude: 2 V
Lead Channel Setting Pacing Amplitude: 4 V
Lead Channel Setting Pacing Pulse Width: 0.8 ms
Lead Channel Setting Pacing Pulse Width: 1 ms
Lead Channel Setting Sensing Sensitivity: 0.9 mV

## 2019-02-27 NOTE — Progress Notes (Signed)
PPM Remote  

## 2019-03-11 DIAGNOSIS — E890 Postprocedural hypothyroidism: Secondary | ICD-10-CM | POA: Diagnosis not present

## 2019-03-11 DIAGNOSIS — R4789 Other speech disturbances: Secondary | ICD-10-CM | POA: Diagnosis not present

## 2019-03-11 DIAGNOSIS — R413 Other amnesia: Secondary | ICD-10-CM | POA: Diagnosis not present

## 2019-03-13 ENCOUNTER — Telehealth: Payer: Self-pay | Admitting: Cardiology

## 2019-03-13 NOTE — Telephone Encounter (Signed)
New Message:   Pt said she have had both of her COVID Vaccines.  She wants to know if Dr Harrell Gave think it will be alright for her go back to church in person?

## 2019-03-13 NOTE — Telephone Encounter (Signed)
It is a very personal choice--there isn't a lot of data yet on when it is safe to resume normal activities. If there is a way to attend church socially distanced and with masks on, that is probably the safest option.

## 2019-03-13 NOTE — Telephone Encounter (Signed)
Pt updated and verbalized understanding.  

## 2019-03-18 DIAGNOSIS — H04123 Dry eye syndrome of bilateral lacrimal glands: Secondary | ICD-10-CM | POA: Diagnosis not present

## 2019-03-19 DIAGNOSIS — D692 Other nonthrombocytopenic purpura: Secondary | ICD-10-CM | POA: Diagnosis not present

## 2019-03-20 DIAGNOSIS — N760 Acute vaginitis: Secondary | ICD-10-CM | POA: Diagnosis not present

## 2019-03-20 DIAGNOSIS — R350 Frequency of micturition: Secondary | ICD-10-CM | POA: Diagnosis not present

## 2019-04-24 DIAGNOSIS — L84 Corns and callosities: Secondary | ICD-10-CM | POA: Diagnosis not present

## 2019-04-24 DIAGNOSIS — L603 Nail dystrophy: Secondary | ICD-10-CM | POA: Diagnosis not present

## 2019-04-24 DIAGNOSIS — I739 Peripheral vascular disease, unspecified: Secondary | ICD-10-CM | POA: Diagnosis not present

## 2019-04-30 ENCOUNTER — Other Ambulatory Visit: Payer: Self-pay | Admitting: Internal Medicine

## 2019-04-30 NOTE — Telephone Encounter (Signed)
Eliquis 5mg  refill request received. Patient is 64 years old, weight-72.6kg, Crea-1.00 on 03/11/2019 via [facility] at [place] PCP, Diagnosis-Afib, and last seen by Dr. Buford Dresser on 01/14/2019. Dose is appropriate based on dosing criteria. Will send in refill to requested pharmacy.

## 2019-05-08 ENCOUNTER — Encounter: Payer: Self-pay | Admitting: *Deleted

## 2019-05-08 DIAGNOSIS — Z23 Encounter for immunization: Secondary | ICD-10-CM | POA: Diagnosis not present

## 2019-05-08 DIAGNOSIS — R300 Dysuria: Secondary | ICD-10-CM | POA: Diagnosis not present

## 2019-05-09 ENCOUNTER — Ambulatory Visit: Payer: Medicare PPO | Admitting: Physician Assistant

## 2019-05-28 ENCOUNTER — Ambulatory Visit (INDEPENDENT_AMBULATORY_CARE_PROVIDER_SITE_OTHER): Payer: Medicare PPO | Admitting: *Deleted

## 2019-05-28 DIAGNOSIS — I447 Left bundle-branch block, unspecified: Secondary | ICD-10-CM

## 2019-05-28 DIAGNOSIS — I428 Other cardiomyopathies: Secondary | ICD-10-CM | POA: Diagnosis not present

## 2019-05-28 LAB — CUP PACEART REMOTE DEVICE CHECK
Battery Remaining Longevity: 108 mo
Battery Voltage: 2.99 V
Brady Statistic AP VP Percent: 1.33 %
Brady Statistic AP VS Percent: 0.04 %
Brady Statistic AS VP Percent: 96.92 %
Brady Statistic AS VS Percent: 1.71 %
Brady Statistic RA Percent Paced: 1.38 %
Brady Statistic RV Percent Paced: 4.24 %
Date Time Interrogation Session: 20210517203313
Implantable Lead Implant Date: 20130110
Implantable Lead Implant Date: 20130110
Implantable Lead Implant Date: 20130110
Implantable Lead Location: 753858
Implantable Lead Location: 753859
Implantable Lead Location: 753860
Implantable Lead Model: 4196
Implantable Lead Model: 5076
Implantable Lead Model: 6935
Implantable Pulse Generator Implant Date: 20181115
Lead Channel Impedance Value: 1349 Ohm
Lead Channel Impedance Value: 361 Ohm
Lead Channel Impedance Value: 418 Ohm
Lead Channel Impedance Value: 589 Ohm
Lead Channel Impedance Value: 703 Ohm
Lead Channel Impedance Value: 722 Ohm
Lead Channel Impedance Value: 741 Ohm
Lead Channel Impedance Value: 855 Ohm
Lead Channel Impedance Value: 969 Ohm
Lead Channel Pacing Threshold Amplitude: 0.625 V
Lead Channel Pacing Threshold Amplitude: 1.375 V
Lead Channel Pacing Threshold Amplitude: 2.5 V
Lead Channel Pacing Threshold Pulse Width: 0.4 ms
Lead Channel Pacing Threshold Pulse Width: 0.4 ms
Lead Channel Pacing Threshold Pulse Width: 0.8 ms
Lead Channel Sensing Intrinsic Amplitude: 0.5 mV
Lead Channel Sensing Intrinsic Amplitude: 0.5 mV
Lead Channel Sensing Intrinsic Amplitude: 18.25 mV
Lead Channel Sensing Intrinsic Amplitude: 18.25 mV
Lead Channel Setting Pacing Amplitude: 2 V
Lead Channel Setting Pacing Amplitude: 2 V
Lead Channel Setting Pacing Amplitude: 4 V
Lead Channel Setting Pacing Pulse Width: 0.8 ms
Lead Channel Setting Pacing Pulse Width: 1 ms
Lead Channel Setting Sensing Sensitivity: 0.9 mV

## 2019-05-30 DIAGNOSIS — G250 Essential tremor: Secondary | ICD-10-CM | POA: Diagnosis not present

## 2019-05-30 DIAGNOSIS — R519 Headache, unspecified: Secondary | ICD-10-CM | POA: Diagnosis not present

## 2019-05-30 DIAGNOSIS — M542 Cervicalgia: Secondary | ICD-10-CM | POA: Diagnosis not present

## 2019-05-30 DIAGNOSIS — G603 Idiopathic progressive neuropathy: Secondary | ICD-10-CM | POA: Diagnosis not present

## 2019-05-30 DIAGNOSIS — G5603 Carpal tunnel syndrome, bilateral upper limbs: Secondary | ICD-10-CM | POA: Diagnosis not present

## 2019-05-30 DIAGNOSIS — G518 Other disorders of facial nerve: Secondary | ICD-10-CM | POA: Diagnosis not present

## 2019-05-30 DIAGNOSIS — M5417 Radiculopathy, lumbosacral region: Secondary | ICD-10-CM | POA: Diagnosis not present

## 2019-05-30 DIAGNOSIS — G245 Blepharospasm: Secondary | ICD-10-CM | POA: Diagnosis not present

## 2019-05-30 DIAGNOSIS — M5412 Radiculopathy, cervical region: Secondary | ICD-10-CM | POA: Diagnosis not present

## 2019-05-30 NOTE — Progress Notes (Signed)
Remote pacemaker transmission.   

## 2019-06-03 ENCOUNTER — Encounter: Payer: Self-pay | Admitting: *Deleted

## 2019-06-06 ENCOUNTER — Other Ambulatory Visit: Payer: Self-pay | Admitting: Physician Assistant

## 2019-06-06 ENCOUNTER — Other Ambulatory Visit: Payer: Self-pay

## 2019-06-06 ENCOUNTER — Ambulatory Visit: Payer: Medicare PPO | Admitting: Physician Assistant

## 2019-06-06 ENCOUNTER — Encounter: Payer: Self-pay | Admitting: Physician Assistant

## 2019-06-06 DIAGNOSIS — Z86007 Personal history of in-situ neoplasm of skin: Secondary | ICD-10-CM | POA: Diagnosis not present

## 2019-06-06 DIAGNOSIS — L719 Rosacea, unspecified: Secondary | ICD-10-CM | POA: Diagnosis not present

## 2019-06-06 DIAGNOSIS — S30860A Insect bite (nonvenomous) of lower back and pelvis, initial encounter: Secondary | ICD-10-CM

## 2019-06-06 DIAGNOSIS — S20462A Insect bite (nonvenomous) of left back wall of thorax, initial encounter: Secondary | ICD-10-CM | POA: Diagnosis not present

## 2019-06-06 DIAGNOSIS — W57XXXA Bitten or stung by nonvenomous insect and other nonvenomous arthropods, initial encounter: Secondary | ICD-10-CM

## 2019-06-06 DIAGNOSIS — Z85828 Personal history of other malignant neoplasm of skin: Secondary | ICD-10-CM

## 2019-06-06 MED ORDER — SOOLANTRA 1 % EX CREA: 1.0000 "application " | TOPICAL_CREAM | Freq: Every day | CUTANEOUS | 2 refills | Status: DC

## 2019-06-06 NOTE — Progress Notes (Signed)
   Follow up Visit  Subjective  [name] is a 64 y.o. female who presents for the following: Rosacea (face - used medicine in past (can't remember name) but not using anything currently). Rosacea on face is worse. Has previously used metrocream and metrolotion with Dr. Tonia Brooms and Rhofade with me. She is having redness and bumps when she wakes up in the morning. She also has a new bump on her back she would like looked at.  Objective  Well appearing patient in no apparent distress; mood and affect are within normal limits.  Face and back examined. Relevant physical exam findings are noted in the Assessment and Plan.   Objective  Head - Anterior (Face): Centrifacial erythema with or without papules/pustules.   Objective  Left Upper Back: Large grey tick attached with small area of surrounding erythema.   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  Skin / nail biopsy - Left Upper Back Type of biopsy: tangential   Informed consent: discussed and consent obtained   Timeout: patient name, date of birth, surgical site, and procedure verified   Procedure prep:  Patient was prepped and draped in usual sterile fashion (Non sterile) Prep type:  Chlorhexidine Anesthesia: the lesion was anesthetized in a standard fashion   Anesthetic:  1% lidocaine w/ epinephrine 1-100,000 local infiltration Instrument used: flexible razor blade   Outcome: patient tolerated procedure well   Post-procedure details: wound care instructions given     Additional details:  This was done to remove entire tick. Not for pathology.

## 2019-06-06 NOTE — Telephone Encounter (Signed)
PRIOR AUTHORIZATION FOR SOOLANTRA DONE ON COVER MY MEDS AND APPROVED UNTIL 01/10/2020. PA# [id number].

## 2019-06-06 NOTE — Telephone Encounter (Signed)
She has done both metrocream and metrolotion in the past.

## 2019-06-06 NOTE — Patient Instructions (Signed)

## 2019-06-07 ENCOUNTER — Telehealth: Payer: Self-pay

## 2019-06-07 NOTE — Telephone Encounter (Signed)
[place] 1% CREAM IS APPROVED UNTIL 01/10/2020 MEMBER ID [id number]

## 2019-06-11 DIAGNOSIS — N3020 Other chronic cystitis without hematuria: Secondary | ICD-10-CM | POA: Diagnosis not present

## 2019-06-12 DIAGNOSIS — E538 Deficiency of other specified B group vitamins: Secondary | ICD-10-CM | POA: Diagnosis not present

## 2019-06-13 DIAGNOSIS — L03116 Cellulitis of left lower limb: Secondary | ICD-10-CM | POA: Diagnosis not present

## 2019-07-08 DIAGNOSIS — N898 Other specified noninflammatory disorders of vagina: Secondary | ICD-10-CM | POA: Diagnosis not present

## 2019-07-08 DIAGNOSIS — R309 Painful micturition, unspecified: Secondary | ICD-10-CM | POA: Diagnosis not present

## 2019-07-08 DIAGNOSIS — R300 Dysuria: Secondary | ICD-10-CM | POA: Diagnosis not present

## 2019-07-13 DIAGNOSIS — S90511A Abrasion, right ankle, initial encounter: Secondary | ICD-10-CM | POA: Diagnosis not present

## 2019-07-13 DIAGNOSIS — W548XXA Other contact with dog, initial encounter: Secondary | ICD-10-CM | POA: Diagnosis not present

## 2019-07-16 DIAGNOSIS — E538 Deficiency of other specified B group vitamins: Secondary | ICD-10-CM | POA: Diagnosis not present

## 2019-07-18 DIAGNOSIS — H04123 Dry eye syndrome of bilateral lacrimal glands: Secondary | ICD-10-CM | POA: Diagnosis not present

## 2019-07-23 DIAGNOSIS — I739 Peripheral vascular disease, unspecified: Secondary | ICD-10-CM | POA: Diagnosis not present

## 2019-07-23 DIAGNOSIS — L603 Nail dystrophy: Secondary | ICD-10-CM | POA: Diagnosis not present

## 2019-07-23 DIAGNOSIS — L84 Corns and callosities: Secondary | ICD-10-CM | POA: Diagnosis not present

## 2019-07-24 ENCOUNTER — Ambulatory Visit: Payer: Medicare PPO | Admitting: Physician Assistant

## 2019-07-24 ENCOUNTER — Other Ambulatory Visit: Payer: Self-pay

## 2019-07-24 ENCOUNTER — Encounter: Payer: Self-pay | Admitting: Physician Assistant

## 2019-07-24 DIAGNOSIS — L719 Rosacea, unspecified: Secondary | ICD-10-CM

## 2019-07-24 DIAGNOSIS — Z85828 Personal history of other malignant neoplasm of skin: Secondary | ICD-10-CM | POA: Diagnosis not present

## 2019-07-24 DIAGNOSIS — Z86007 Personal history of in-situ neoplasm of skin: Secondary | ICD-10-CM

## 2019-07-24 DIAGNOSIS — L570 Actinic keratosis: Secondary | ICD-10-CM

## 2019-07-24 NOTE — Progress Notes (Addendum)
   Follow up Visit  Subjective  [name] is a 64 y.o. female who presents for the following: Rosacea (IVERMECTIN X 1 MONTH NO CHANGE). She feels the senile purpura on her arms has gotten better as she has been wearing padded sleeves to protect her arms. Rosacea has not improved with soolantra cream. She has been using it for 1 month. Left temple gets a thick spot that she pulls off and it comes back.    Objective  Well appearing patient in no apparent distress; mood and affect are within normal limits.  A focused examination was performed including face, arms. Relevant physical exam findings are noted in the Assessment and Plan.   Objective  Left Supraclavicular Area, Left Temple (2): Erythematous patches with gritty scale.  Objective  Right Alar Crease: Overall her cheek erythema does seem to have improved. She has two small pink papules that have come up on her right cheek and she does have some telangiectasia that I have told her the cream most likely will not resolve.  Assessment & Plan  AK (actinic keratosis) (3) Left Temple (2); Left Supraclavicular Area  Destruction of lesion - Left Supraclavicular Area, Left Temple Complexity: simple   Destruction method: cryotherapy   Informed consent: discussed and consent obtained   Timeout:  patient name, date of birth, surgical site, and procedure verified Lesion destroyed using liquid nitrogen: Yes   Outcome: patient tolerated procedure well with no complications    Rosacea Right Alar Crease  Continue Ivermectin at night. Sample of synalar cream given for her to put on bumps on right cheek in the morning. Call if they dont resolve or get worse. We also discussed laser for the telangiectasia.  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

## 2019-08-01 ENCOUNTER — Encounter: Payer: Self-pay | Admitting: Cardiology

## 2019-08-01 ENCOUNTER — Other Ambulatory Visit: Payer: Self-pay

## 2019-08-01 ENCOUNTER — Ambulatory Visit: Payer: Medicare PPO | Admitting: Cardiology

## 2019-08-01 VITALS — BP 92/72 | HR 73 | Ht 67.0 in | Wt 164.0 lb

## 2019-08-01 DIAGNOSIS — I447 Left bundle-branch block, unspecified: Secondary | ICD-10-CM

## 2019-08-01 DIAGNOSIS — I480 Paroxysmal atrial fibrillation: Secondary | ICD-10-CM

## 2019-08-01 DIAGNOSIS — I428 Other cardiomyopathies: Secondary | ICD-10-CM | POA: Diagnosis not present

## 2019-08-01 DIAGNOSIS — Z7901 Long term (current) use of anticoagulants: Secondary | ICD-10-CM | POA: Diagnosis not present

## 2019-08-01 DIAGNOSIS — R600 Localized edema: Secondary | ICD-10-CM

## 2019-08-01 NOTE — Patient Instructions (Addendum)
Medication Instructions:  No changes *If you need a refill on your cardiac medications before your next appointment, please call your pharmacy*   Lab Work: Not needed If you have labs (blood work) drawn today and your tests are completely normal, you will receive your results only by: Marland Kitchen MyChart Message (if you have MyChart) OR . A paper copy in the mail If you have any lab test that is abnormal or we need to change your treatment, we will call you to review the results.  Other Instructions -counseled on how to check blood pressure:  -sit comfortably in a chair, feet uncrossed and flat on floor, for 5-10 minutes  -arm ideally should rest at the level of the heart. However, arm should be relaxed and not tense (for example, do not hold the arm up unsupported)  -avoid exercise, caffeine, and tobacco for at least 30 minutes prior to BP reading  -don't take BP cuff reading over clothes (always place on skin directly)  -I prefer to know how well the medication is working, so I would like you to take your readings 1-2 hours after taking your blood pressure medication if possible  Testing/Procedures: Will be schedule at [facility] has requested that you have an echocardiogram. Echocardiography is a painless test that uses sound waves to create images of your heart. It provides your doctor with information about the size and shape of your heart and how well your heart's chambers and valves are working. This procedure takes approximately one hour. There are no restrictions for this procedure.     Follow-Up: At [facility], you and your health needs are our priority.  As part of our continuing mission to provide you with exceptional heart care, we have created designated Provider Care Teams.  These Care Teams include your primary Cardiologist (physician) and Advanced Practice Providers (APPs -  Physician Assistants and Nurse Practitioners) who all work  together to provide you with the care you need, when you need it.  We recommend signing up for the patient portal called "MyChart".  Sign up information is provided on this After Visit Summary.  MyChart is used to connect with patients for Virtual Visits (Telemedicine).  Patients are able to view lab/test results, encounter notes, upcoming appointments, etc.  Non-urgent messages can be sent to your provider as well.   To learn more about what you can do with MyChart, go to [email or web address].    Your next appointment:   4 to 6 week(s)  The format for your next appointment:   virtual or in office  Provider:   Buford Dresser, MD

## 2019-08-01 NOTE — Progress Notes (Signed)
Cardiology Office Note:    Date:  08/01/2019   ID:  [name], DOB 1955/03/26, MRN [id number]  PCP:  [name], MD  Cardiologist:  Buford Dresser, MD  Referring MD: [name], MD   CC: follow up  History of Present Illness:    [name] is a 64 y.o. female with a hx of hx of LBBB, NICM s/p CRT-D-->Pwith normalization of EF, paroxysmal atrial fibrillation.   Today: Blood pressure today is low; she has been gradually downtrending based on available readings here. Doesn't check home Bps. Has taken morning medications today. Feels listless, takes her a long time to get things done around the house.  She has been feeling lethargic lately, legs swollen by the end of the day but improved by the morning. Weight is up, which she feels is due to diet. She is feeling bad as Dr. Lovena Le asked her to lose weight to get to 150 lbs. She got to 155 lbs, but then weight went back up, and she is worried about disappointing him.  She notes that both her mother and her sister have issues with leg swelling/weeping legs. We discussed venous insufficiency today, she will try compression stockings and see if it helps.  Denies chest pain, shortness of breath at rest or with normal exertion. Only SOB with walking inclines. No PND, orthopnea. No syncope or palpitations. Rare brief strange feelings in her chest, not a pain, random, not associated with exercise. Bleeding with cuts but no other bleeding issues.  Discussed echo, home blood pressures readings. She does have a BP cuff, husband can help her.  Past Medical History:  Diagnosis Date  . AF (paroxysmal atrial fibrillation) ([place])   . Arthritis   . Basal cell carcinoma 11/11/1998   left forehead-tx dr Tonia Brooms  . BCC (basal cell carcinoma) 03/11/2003   left distal lower leg ankle-tx dr Tonia Brooms  . BCC (basal cell carcinoma) 11/19/2009   left anterior lat distal lower leg  . Biventricular cardiac pacemaker in situ    a.  prior CRT-D in 2013 with downgrade to CRT-Pacemaker only in 11/2016.  Marland Kitchen Chronic systolic CHF (congestive heart failure) ([place])   . Complication of anesthesia    aspiration  . Essential tremor   . Fibroids   . GERD (gastroesophageal reflux disease)    otc  . Hypertension   . Hyperthyroidism   . Hypothyroidism   . LBBB (left bundle branch block)    conduction disorder  . Migraines   . Mild aortic insufficiency   . NICM (nonischemic cardiomyopathy) ([place])    a. 2012: normal coronaries, EF 25-30%. b. improved to 55% 11/2016.  . Osteoporosis   . Pleural effusion, right    thoracentesis 02/09/09   . Raynaud's disease   . SCC (squamous cell carcinoma) 07/27/2009   left preauricular-dr gruber  . SCC (squamous cell carcinoma) 08/20/2018   right upper lip-mohs Dr.mitkov  . Scoliosis   . Skin cancer    "squamous cell on nose, forehead, & left leg"  . Squamous cell carcinoma of skin 07/14/2004   right dorsal lateral upper nose-mohs    Past Surgical History:  Procedure Laterality Date  . ARTERY BIOPSY  12/29/2011   Procedure: MINOR BIOPSY TEMPORAL ARTERY;  Surgeon: Edward Jolly, MD;  Location: [facility];  Service: General;  Laterality: Right;  Right temporal artery biopsy  . BACK SURGERY  12/31/2009   "for flat back syndrome; fused bottom of my back"  . BI-VENTRICULAR IMPLANTABLE  CARDIOVERTER DEFIBRILLATOR Left 01/20/2011   Procedure: BI-VENTRICULAR IMPLANTABLE CARDIOVERTER DEFIBRILLATOR  (CRT-D);  Surgeon: Evans Lance, MD;  [facility] C[facility] LAB;  Service: Cardiovascular;  Laterality: Left;  . BIV ICD GENERATOR CHANGEOUT N/A 11/24/2016   Procedure: BIV ICD GENERATOR CHANGEOUT;  Surgeon: Evans Lance, MD;  Location: [facility] CV LAB;  Service: Cardiovascular;  Laterality: N/A;  . CHOLECYSTECTOMY  ~1996  . FOOT NEUROMA SURGERY  ~ 2003   right  . ROTATOR CUFF REPAIR  ~ 2009   left  . SHOULDER ARTHROSCOPY W/ ROTATOR CUFF REPAIR  09/21/2011  . SKIN CANCER  EXCISION     nose, forehead, left leg  . TEE WITHOUT CARDIOVERSION N/A 03/01/2017   Procedure: TRANSESOPHAGEAL ECHOCARDIOGRAM (TEE);  Surgeon: Skeet Latch, MD;  Location: [facility];  Service: Cardiovascular;  Laterality: N/A;  . THYROID LOBECTOMY  ~ 2006   right  . TUBAL LIGATION  ~ 1983  . VAGINAL HYSTERECTOMY  ~ 2009  . VESICOVAGINAL FISTULA CLOSURE W/ TAH      Current Medications: Current Outpatient Medications on File Prior to Visit  Medication Sig  . amitriptyline (ELAVIL) 25 MG tablet Take 81m by mouth every night  . CALCIUM PO Take 2 tablets by mouth daily.  . Carboxymethylcellul-Glycerin (LUBRICATING EYE DROPS OP) Place 1 drop into both eyes 2 (two) times daily.   . Cyanocobalamin (B-12 COMPLIANCE INJECTION) 1000 MCG/ML KIT Inject 1,000 mcg every 30 (thirty) days as directed.  . eletriptan (RELPAX) 40 MG tablet Take 40 mg by mouth daily as needed for migraine. May repeat in 2 hours if necessary  . ELIQUIS 5 MG TABS tablet TAKE 1 TABLET BY MOUTH TWICE A DAY  . gabapentin (NEURONTIN) 600 MG tablet Take 600 mg by mouth 2 (two) times daily.  .Marland Kitchenlevothyroxine (SYNTHROID, LEVOTHROID) 100 MCG tablet Take 100 mcg daily before breakfast by mouth.  . Melatonin 3 MG TABS Take 9 mg by mouth at bedtime.   . montelukast (SINGULAIR) 10 MG tablet Take 10 mg by mouth daily.  . polyethylene glycol (MIRALAX / GLYCOLAX) packet Take 17 g every evening by mouth.  . propranolol (INDERAL) 60 MG tablet Take 60 mg by mouth daily.  .Marland Kitchenspironolactone (ALDACTONE) 25 MG tablet TAKE 0.5 TABLETS (12.5 MG TOTAL) BY MOUTH 2 (TWO) TIMES DAILY.  .Marland KitchenVITAMIN D PO Take 2 capsules by mouth daily.   No current facility-administered medications on file prior to visit.     Allergies:   Other, Tape, Codeine, Fentanyl, Morphine and related, and Robaxin [methocarbamol]   Social History   Tobacco Use  . Smoking status: Never Smoker  . Smokeless tobacco: Never Used  Vaping Use  . Vaping Use: Never used   Substance Use Topics  . Alcohol use: No  . Drug use: No    Family History: family history includes Atrial fibrillation in her sister; Heart disease in her mother, sister, and sister; Heart disease (age of onset: 964 in her father; Heart failure (age of onset: 836 in her mother; Hypertension in her mother; Macular degeneration in her father; Stroke in her mother.  ROS:   Please see the history of present illness.  Additional pertinent ROS otherwise unremarkable.  EKGs/Labs/Other Studies Reviewed:    The following studies were reviewed today: TEE 203-07-2017- Left ventricle: Systolic function was normal. Wall motion was normal; there were no regional wall motion abnormalities. - Aortic valve: There was mild regurgitation. - Mitral valve: There was trivial regurgitation. - Left atrium: No evidence of  thrombus in the atrial cavity or appendage. No evidence of thrombus in the atrial cavity or appendage. - Right ventricle: The cavity size was normal. Wall thickness was normal. Systolic function was normal. - Right atrium: No evidence of thrombus in the atrial cavity or appendage. - Atrial septum: A patent foramen ovale is likely. There was no right to left flow at rest but saline microcavitation study was positive with abdominal pressure. The test was repeated to ensure that there was no extracardiac shunt and was negative. - Tricuspid valve: There was trivial regurgitation.  TTE 02/2017 Study Conclusions  - Left ventricle: The cavity size was normal. Wall thickness was normal. Systolic function was normal. The estimated ejection fraction was in the range of 60% to 65%. Wall motion was normal; there were no regional wall motion abnormalities. Doppler parameters are consistent with abnormal left ventricular relaxation (grade 1 diastolic dysfunction). The E/e&' ratio is <8, suggesting normal LV filling pressure. - Aortic valve: Sclerosis without stenosis.  There was trivial regurgitation. - Left atrium: The atrium was normal in size. - Tricuspid valve: There was trivial regurgitation. - Pulmonary arteries: PA peak pressure: 30 mm Hg (S). - Inferior vena cava: The vessel was normal in size. The respirophasic diameter changes were in the normal range (>= 50%), consistent with normal central venous pressure.  Impressions:  - Compared to a prior study in 2018, the LVEF is higher at 60-65% and there is now grade 1 DD with normal LV filling pressure.  EKG:  EKG is personally reviewed.  The ekg ordered today demonstrates atrial-sensed, ventricular-paced rhythm at 73 bpm  Recent Labs: No results found for requested labs within last 8760 hours.  Recent Lipid Panel    Component Value Date/Time   CHOL  02/11/2010 0555    103        ATP III CLASSIFICATION:  <200     mg/dL   Desirable  200-239  mg/dL   Borderline High  >=240    mg/dL   High          TRIG 85 02/11/2010 0555   HDL 42 02/11/2010 0555   CHOLHDL 2.5 02/11/2010 0555   VLDL 17 02/11/2010 0555   LDLCALC  02/11/2010 0555    44        Total Cholesterol/HDL:CHD Risk Coronary Heart Disease Risk Table                     Men   Women  1/2 Average Risk   3.4   3.3  Average Risk       5.0   4.4  2 X Average Risk   9.6   7.1  3 X Average Risk  23.4   11.0        Use the calculated Patient Ratio above and the CHD Risk Table to determine the patient's CHD Risk.        ATP III CLASSIFICATION (LDL):  <100     mg/dL   Optimal  100-129  mg/dL   Near or Above                    Optimal  130-159  mg/dL   Borderline  160-189  mg/dL   High  >190     mg/dL   Very High    Physical Exam:    VS:  BP 92/72   Pulse 73   Ht '5\' 7"'  (1.702 m)   Wt 164 lb (74.4 kg)  SpO2 94%   BMI 25.69 kg/m     Wt Readings from Last 3 Encounters:  08/01/19 164 lb (74.4 kg)  01/14/19 160 lb (72.6 kg)  10/08/18 155 lb 9.6 oz (70.6 kg)    GEN: Well nourished, well developed in no acute  distress HEENT: Normal, moist mucous membranes NECK: No JVD CARDIAC: regular rhythm, normal S1 and S2, no rubs or gallops. No murmur. VASCULAR: Radial and DP pulses 2+ bilaterally. No carotid bruits RESPIRATORY:  Clear to auscultation without rales, wheezing or rhonchi  ABDOMEN: Soft, non-tender, non-distended MUSCULOSKELETAL:  Ambulates independently SKIN: Warm and dry, trace bilateral LE edema NEUROLOGIC:  Alert and oriented x 3. No focal neuro deficits noted. PSYCHIATRIC:  Normal affect   ASSESSMENT:    1. Bilateral leg edema   2. Nonischemic cardiomyopathy ([place])   3. Left bundle branch block   4. Long term current use of anticoagulant therapy   5. AF (paroxysmal atrial fibrillation) ([facility])    PLAN:    Nonischemic cardiomyopathy, LBBB: EF normalized s/p CRT, follows with Dr. Lovena Le -no JVD, but trace LE edema, fatigue, SOB with inclines. NYHA class II-III -spironolactone, see below -on propranolol 60 mg daily. We have discussed cutting this back in the past due to low BP, but it helps her tremor, and she would like to continue if possible -given worsening symptoms, will check echo.  Bilateral LE edema: -had been well controlled on spironolactone, but now recurring. -has prominent veins, family history of venous insufficiency -will rule out worsening EF as cause, but if unchanged suspect this may be venous insufficiency -discussed elevation, compression stockings  Hypertension:  -has been downtrending. I am concerned that this may be part of her symptoms. She is very hesistant to stop/cut back on propranolol or spironolactone. 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  -It was never fully determined what caused her splenic infarction, as she had a possible PFO on TEE but also had only very brief afib seen on her device. Dr. Wynonia Lawman previously started  apixaban, and she is tolerating this without issue. She prefers to be on anticoagulation vs. Risking another embolic event given the uncertain cause.  Cardiac risk counseling and prevention recommendations: -recommend heart healthy/Mediterranean diet, with whole grains, fruits, vegetable, fish, lean meats, nuts, and olive oil. Limit salt. -recommend moderate walking, 3-5 times/week for 30-50 minutes each session. Aim for at least 150 minutes.week. Goal should be pace of 3 miles/hours, or walking 1.5 miles in 30 minutes -recommend avoidance of tobacco products. Avoid excess alcohol.  Plan for follow up: 4-6 weeks  Buford Dresser, MD, PhD [place]  [facility]   Medication Adjustments/Labs and Tests Ordered: Current medicines are reviewed at length with the patient today.  Concerns regarding medicines are outlined above.  Orders Placed This Encounter  Procedures  . EKG 12-Lead  . ECHOCARDIOGRAM COMPLETE   No orders of the defined types were placed in this encounter.   Patient Instructions  Medication Instructions:  No changes *If you need a refill on your cardiac medications before your next appointment, please call your pharmacy*   Lab Work: Not needed If you have labs (blood work) drawn today and your tests are completely normal, you will receive your results only by: Marland Kitchen MyChart Message (if you have MyChart) OR . A paper copy in the mail If you have any lab test that is abnormal  or we need to change your treatment, we will call you to review the results.  Other Instructions -counseled on how to check blood pressure:  -sit comfortably in a chair, feet uncrossed and flat on floor, for 5-10 minutes  -arm ideally should rest at the level of the heart. However, arm should be relaxed and not tense (for example, do not hold the arm up unsupported)  -avoid exercise, caffeine, and tobacco for at least 30 minutes prior to BP reading  -don't take BP cuff reading over clothes  (always place on skin directly)  -I prefer to know how well the medication is working, so I would like you to take your readings 1-2 hours after taking your blood pressure medication if possible  Testing/Procedures: Will be schedule at [facility] has requested that you have an echocardiogram. Echocardiography is a painless test that uses sound waves to create images of your heart. It provides your doctor with information about the size and shape of your heart and how well your heart's chambers and valves are working. This procedure takes approximately one hour. There are no restrictions for this procedure.     Follow-Up: At [facility], you and your health needs are our priority.  As part of our continuing mission to provide you with exceptional heart care, we have created designated Provider Care Teams.  These Care Teams include your primary Cardiologist (physician) and Advanced Practice Providers (APPs -  Physician Assistants and Nurse Practitioners) who all work together to provide you with the care you need, when you need it.  We recommend signing up for the patient portal called "MyChart".  Sign up information is provided on this After Visit Summary.  MyChart is used to connect with patients for Virtual Visits (Telemedicine).  Patients are able to view lab/test results, encounter notes, upcoming appointments, etc.  Non-urgent messages can be sent to your provider as well.   To learn more about what you can do with MyChart, go to [email or web address].    Your next appointment:   4 to 6 week(s)  The format for your next appointment:   virtual or in office  Provider:   Buford Dresser, MD       Signed, Buford Dresser, MD PhD 08/01/2019 8:45 AM    [place]

## 2019-08-05 DIAGNOSIS — L900 Lichen sclerosus et atrophicus: Secondary | ICD-10-CM | POA: Diagnosis not present

## 2019-08-05 DIAGNOSIS — N958 Other specified menopausal and perimenopausal disorders: Secondary | ICD-10-CM | POA: Diagnosis not present

## 2019-08-07 DIAGNOSIS — M542 Cervicalgia: Secondary | ICD-10-CM | POA: Diagnosis not present

## 2019-08-07 DIAGNOSIS — M4692 Unspecified inflammatory spondylopathy, cervical region: Secondary | ICD-10-CM | POA: Diagnosis not present

## 2019-08-13 DIAGNOSIS — M542 Cervicalgia: Secondary | ICD-10-CM | POA: Diagnosis not present

## 2019-08-14 ENCOUNTER — Ambulatory Visit (HOSPITAL_COMMUNITY): Payer: Medicare PPO | Attending: Cardiovascular Disease

## 2019-08-14 ENCOUNTER — Other Ambulatory Visit: Payer: Self-pay

## 2019-08-14 DIAGNOSIS — I428 Other cardiomyopathies: Secondary | ICD-10-CM

## 2019-08-14 DIAGNOSIS — R600 Localized edema: Secondary | ICD-10-CM | POA: Insufficient documentation

## 2019-08-14 DIAGNOSIS — I480 Paroxysmal atrial fibrillation: Secondary | ICD-10-CM | POA: Diagnosis not present

## 2019-08-14 LAB — ECHOCARDIOGRAM COMPLETE
Area-P 1/2: 3.72 cm2
P 1/2 time: 475 msec
S' Lateral: 2.5 cm

## 2019-08-15 DIAGNOSIS — E538 Deficiency of other specified B group vitamins: Secondary | ICD-10-CM | POA: Diagnosis not present

## 2019-08-27 ENCOUNTER — Ambulatory Visit (INDEPENDENT_AMBULATORY_CARE_PROVIDER_SITE_OTHER): Payer: Medicare PPO | Admitting: *Deleted

## 2019-08-27 DIAGNOSIS — I428 Other cardiomyopathies: Secondary | ICD-10-CM

## 2019-08-27 LAB — CUP PACEART REMOTE DEVICE CHECK
Battery Remaining Longevity: 82 mo
Battery Voltage: 2.99 V
Brady Statistic AP VP Percent: 1.06 %
Brady Statistic AP VS Percent: 0.04 %
Brady Statistic AS VP Percent: 97.2 %
Brady Statistic AS VS Percent: 1.71 %
Brady Statistic RA Percent Paced: 1.1 %
Brady Statistic RV Percent Paced: 3.8 %
Date Time Interrogation Session: 20210816230511
Implantable Lead Implant Date: 20130110
Implantable Lead Implant Date: 20130110
Implantable Lead Implant Date: 20130110
Implantable Lead Location: 753858
Implantable Lead Location: 753859
Implantable Lead Location: 753860
Implantable Lead Model: 4196
Implantable Lead Model: 5076
Implantable Lead Model: 6935
Implantable Pulse Generator Implant Date: 20181115
Lead Channel Impedance Value: 1330 Ohm
Lead Channel Impedance Value: 361 Ohm
Lead Channel Impedance Value: 418 Ohm
Lead Channel Impedance Value: 589 Ohm
Lead Channel Impedance Value: 703 Ohm
Lead Channel Impedance Value: 836 Ohm
Lead Channel Impedance Value: 855 Ohm
Lead Channel Impedance Value: 855 Ohm
Lead Channel Impedance Value: 969 Ohm
Lead Channel Pacing Threshold Amplitude: 0.625 V
Lead Channel Pacing Threshold Amplitude: 1.5 V
Lead Channel Pacing Threshold Amplitude: 2.375 V
Lead Channel Pacing Threshold Pulse Width: 0.4 ms
Lead Channel Pacing Threshold Pulse Width: 0.4 ms
Lead Channel Pacing Threshold Pulse Width: 0.8 ms
Lead Channel Sensing Intrinsic Amplitude: 1.125 mV
Lead Channel Sensing Intrinsic Amplitude: 1.125 mV
Lead Channel Sensing Intrinsic Amplitude: 17 mV
Lead Channel Sensing Intrinsic Amplitude: 17 mV
Lead Channel Setting Pacing Amplitude: 2 V
Lead Channel Setting Pacing Amplitude: 2.75 V
Lead Channel Setting Pacing Amplitude: 4 V
Lead Channel Setting Pacing Pulse Width: 0.8 ms
Lead Channel Setting Pacing Pulse Width: 1 ms
Lead Channel Setting Sensing Sensitivity: 0.9 mV

## 2019-08-28 ENCOUNTER — Other Ambulatory Visit (HOSPITAL_COMMUNITY): Payer: Self-pay | Admitting: Family Medicine

## 2019-08-28 ENCOUNTER — Other Ambulatory Visit: Payer: Self-pay | Admitting: Family Medicine

## 2019-08-28 DIAGNOSIS — M542 Cervicalgia: Secondary | ICD-10-CM

## 2019-08-29 DIAGNOSIS — M542 Cervicalgia: Secondary | ICD-10-CM | POA: Diagnosis not present

## 2019-08-29 NOTE — Progress Notes (Signed)
Remote pacemaker transmission.   

## 2019-09-03 DIAGNOSIS — M542 Cervicalgia: Secondary | ICD-10-CM | POA: Diagnosis not present

## 2019-09-09 ENCOUNTER — Ambulatory Visit (HOSPITAL_COMMUNITY)
Admission: RE | Admit: 2019-09-09 | Discharge: 2019-09-09 | Disposition: A | Discharge status: Home / Self Care | Payer: Medicare PPO | Source: Ambulatory Visit | Attending: Family Medicine | Admitting: Family Medicine

## 2019-09-09 ENCOUNTER — Other Ambulatory Visit: Payer: Self-pay

## 2019-09-09 DIAGNOSIS — M542 Cervicalgia: Secondary | ICD-10-CM | POA: Diagnosis not present

## 2019-09-09 NOTE — Progress Notes (Signed)
Per below order,  "Device system confirmed to be MRI conditional  implant date > 6 weeks ago  no evidence of abandoned or epicardial leads in review of most recent CXR  interrogation from today deone by industry and reported verbally  Chronically elevated RV thresholds, unchanged, patient not dependent AS/VS underlying 50's  Change device settings for MRI to  DOO at 75 bpm  Program device back to pre-MRI settings after completion of exam, and send transmission"  [employer] rep changed patient settings. I will monitor patient during MRI and will program device back to pre-MRI settings after completion of exam and send transmission.

## 2019-09-10 DIAGNOSIS — M542 Cervicalgia: Secondary | ICD-10-CM | POA: Diagnosis not present

## 2019-09-11 DIAGNOSIS — N819 Female genital prolapse, unspecified: Secondary | ICD-10-CM | POA: Diagnosis not present

## 2019-09-11 DIAGNOSIS — R319 Hematuria, unspecified: Secondary | ICD-10-CM | POA: Diagnosis not present

## 2019-09-11 DIAGNOSIS — R309 Painful micturition, unspecified: Secondary | ICD-10-CM | POA: Diagnosis not present

## 2019-09-11 DIAGNOSIS — N390 Urinary tract infection, site not specified: Secondary | ICD-10-CM | POA: Diagnosis not present

## 2019-09-11 DIAGNOSIS — N760 Acute vaginitis: Secondary | ICD-10-CM | POA: Diagnosis not present

## 2019-09-12 ENCOUNTER — Ambulatory Visit: Payer: Medicare PPO | Admitting: Physician Assistant

## 2019-09-12 ENCOUNTER — Ambulatory Visit: Payer: Medicare PPO | Admitting: Cardiology

## 2019-09-12 ENCOUNTER — Other Ambulatory Visit: Payer: Self-pay | Admitting: Cardiology

## 2019-09-13 DIAGNOSIS — M542 Cervicalgia: Secondary | ICD-10-CM | POA: Diagnosis not present

## 2019-09-19 ENCOUNTER — Telehealth (INDEPENDENT_AMBULATORY_CARE_PROVIDER_SITE_OTHER): Payer: Medicare PPO | Admitting: Cardiology

## 2019-09-19 VITALS — BP 109/69 | HR 96 | Ht 67.0 in | Wt 160.0 lb

## 2019-09-19 DIAGNOSIS — G603 Idiopathic progressive neuropathy: Secondary | ICD-10-CM | POA: Diagnosis not present

## 2019-09-19 DIAGNOSIS — G5603 Carpal tunnel syndrome, bilateral upper limbs: Secondary | ICD-10-CM | POA: Diagnosis not present

## 2019-09-19 DIAGNOSIS — I480 Paroxysmal atrial fibrillation: Secondary | ICD-10-CM | POA: Diagnosis not present

## 2019-09-19 DIAGNOSIS — I428 Other cardiomyopathies: Secondary | ICD-10-CM | POA: Diagnosis not present

## 2019-09-19 DIAGNOSIS — M542 Cervicalgia: Secondary | ICD-10-CM | POA: Diagnosis not present

## 2019-09-19 DIAGNOSIS — Z7901 Long term (current) use of anticoagulants: Secondary | ICD-10-CM

## 2019-09-19 DIAGNOSIS — M545 Low back pain: Secondary | ICD-10-CM | POA: Diagnosis not present

## 2019-09-19 DIAGNOSIS — I10 Essential (primary) hypertension: Secondary | ICD-10-CM | POA: Diagnosis not present

## 2019-09-19 DIAGNOSIS — R600 Localized edema: Secondary | ICD-10-CM | POA: Diagnosis not present

## 2019-09-19 DIAGNOSIS — I447 Left bundle-branch block, unspecified: Secondary | ICD-10-CM

## 2019-09-19 NOTE — Progress Notes (Signed)
Virtual Visit via Telephone Note   This visit type was conducted due to national recommendations for restrictions regarding the COVID-19 Pandemic (e.g. social distancing) in an effort to limit this patient's exposure and mitigate transmission in our community.  Due to her co-morbid illnesses, this patient is at least at moderate risk for complications without adequate follow up.  This format is felt to be most appropriate for this patient at this time.  The patient did not have access to video technology/had technical difficulties with video requiring transitioning to audio format only (telephone).  All issues noted in this document were discussed and addressed.  No physical exam could be performed with this format.  Please refer to the patient's chart for her  consent to telehealth for [facility].   The patient was identified using 2 identifiers.  Patient Location: Home Provider Location: Home Office  Date:  09/19/2019   ID:  [name], DOB [birth year], MRN [id number]  PCP:  [facility] [place]  Cardiologist:  Buford Dresser, MD  Referring MD: [name], MD   CC: follow up  History of Present Illness:    [name] is a 64 y.o. female with a hx of hx of LBBB, NICM s/p CRT-D-->Pwith normalization of EF, paroxysmal atrial fibrillation.   Today: Feet are no longer swelling, elevating feet helps significantly. Blood pressures have been 98-113/60s.  Breathing is better, able to walk. Can walk up a hill now.  Has questions about gabapentin, vitamins. Also asking about stimulants in weight loss. Discussed this to the extent of my knowledge but recommended she discuss with her PCP as well.  Denies chest pain, shortness of breath at rest. No PND, orthopnea. No syncope or palpitations.  Past Medical History:  Diagnosis Date  . AF (paroxysmal atrial fibrillation) ([place])   . Arthritis   . Basal cell carcinoma 11/11/1998   left forehead-tx dr  Tonia Brooms  . BCC (basal cell carcinoma) 03/11/2003   left distal lower leg ankle-tx dr Tonia Brooms  . BCC (basal cell carcinoma) 11/19/2009   left anterior lat distal lower leg  . Biventricular cardiac pacemaker in situ    a. prior CRT-D in 2013 with downgrade to CRT-Pacemaker only in 11/2016.  Marland Kitchen Chronic systolic CHF (congestive heart failure) ([place])   . Complication of anesthesia    aspiration  . Essential tremor   . Fibroids   . GERD (gastroesophageal reflux disease)    otc  . Hypertension   . Hyperthyroidism   . Hypothyroidism   . LBBB (left bundle branch block)    conduction disorder  . Migraines   . Mild aortic insufficiency   . NICM (nonischemic cardiomyopathy) ([place])    a. 2012: normal coronaries, EF 25-30%. b. improved to 55% 11/2016.  . Osteoporosis   . Pleural effusion, right    thoracentesis 02/09/09   . Raynaud's disease   . SCC (squamous cell carcinoma) 07/27/2009   left preauricular-dr gruber  . SCC (squamous cell carcinoma) 08/20/2018   right upper lip-mohs Dr.mitkov  . Scoliosis   . Skin cancer    "squamous cell on nose, forehead, & left leg"  . Squamous cell carcinoma of skin 07/14/2004   right dorsal lateral upper nose-mohs    Past Surgical History:  Procedure Laterality Date  . ARTERY BIOPSY  12/29/2011   Procedure: MINOR BIOPSY TEMPORAL ARTERY;  Surgeon: Edward Jolly, MD;  Location: [facility];  Service: General;  Laterality: Right;  Right temporal artery  biopsy  . BACK SURGERY  12/31/2009   "for flat back syndrome; fused bottom of my back"  . BI-VENTRICULAR IMPLANTABLE CARDIOVERTER DEFIBRILLATOR Left 01/20/2011   Procedure: BI-VENTRICULAR IMPLANTABLE CARDIOVERTER DEFIBRILLATOR  (CRT-D);  Surgeon: Evans Lance, MD;  Location: [facility] CATH LAB;  Service: Cardiovascular;  Laterality: Left;  . BIV ICD GENERATOR CHANGEOUT N/A 11/24/2016   Procedure: BIV ICD GENERATOR CHANGEOUT;  Surgeon: Evans Lance, MD;  Location: [facility] CV LAB;   Service: Cardiovascular;  Laterality: N/A;  . CHOLECYSTECTOMY  ~1996  . FOOT NEUROMA SURGERY  ~ 2003   right  . ROTATOR CUFF REPAIR  ~ 2009   left  . SHOULDER ARTHROSCOPY W/ ROTATOR CUFF REPAIR  09/21/2011  . SKIN CANCER EXCISION     nose, forehead, left leg  . TEE WITHOUT CARDIOVERSION N/A 03/01/2017   Procedure: TRANSESOPHAGEAL ECHOCARDIOGRAM (TEE);  Surgeon: Skeet Latch, MD;  Location: [facility];  Service: Cardiovascular;  Laterality: N/A;  . THYROID LOBECTOMY  ~ 2006   right  . TUBAL LIGATION  ~ 1983  . VAGINAL HYSTERECTOMY  ~ 2009  . VESICOVAGINAL FISTULA CLOSURE W/ TAH      Current Medications: Current Outpatient Medications on File Prior to Visit  Medication Sig  . amitriptyline (ELAVIL) 25 MG tablet Take 51m by mouth every night  . CALCIUM PO Take 2 tablets by mouth daily.  . Carboxymethylcellul-Glycerin (LUBRICATING EYE DROPS OP) Place 1 drop into both eyes 2 (two) times daily.   . Cyanocobalamin (B-12 COMPLIANCE INJECTION) 1000 MCG/ML KIT Inject 1,000 mcg every 30 (thirty) days as directed.  . eletriptan (RELPAX) 40 MG tablet Take 40 mg by mouth daily as needed for migraine. May repeat in 2 hours if necessary  . ELIQUIS 5 MG TABS tablet TAKE 1 TABLET BY MOUTH TWICE A DAY  . gabapentin (NEURONTIN) 600 MG tablet Take 600 mg by mouth 2 (two) times daily.  .Marland Kitchenlevothyroxine (SYNTHROID, LEVOTHROID) 100 MCG tablet Take 100 mcg daily before breakfast by mouth.  . Melatonin 3 MG TABS Take 9 mg by mouth at bedtime.   . montelukast (SINGULAIR) 10 MG tablet Take 10 mg by mouth daily.  . polyethylene glycol (MIRALAX / GLYCOLAX) packet Take 17 g every evening by mouth.  . propranolol (INDERAL) 60 MG tablet Take 60 mg by mouth daily.  .Marland Kitchenspironolactone (ALDACTONE) 25 MG tablet TAKE 1/2 TABLET BY MOUTH TWICE A DAY  . VITAMIN D PO Take 2 capsules by mouth daily.   No current facility-administered medications on file prior to visit.     Allergies:   Other, Tape, Codeine,  Fentanyl, Morphine and related, and Robaxin [methocarbamol]   Social History   Tobacco Use  . Smoking status: Never Smoker  . Smokeless tobacco: Never Used  Vaping Use  . Vaping Use: Never used  Substance Use Topics  . Alcohol use: No  . Drug use: No    Family History: family history includes Atrial fibrillation in her sister; Heart disease in her mother, sister, and sister; Heart disease (age of onset: 959 in her father; Heart failure (age of onset: 883 in her mother; Hypertension in her mother; Macular degeneration in her father; Stroke in her mother.  ROS:   Please see the history of present illness.  Additional pertinent ROS otherwise unremarkable.  EKGs/Labs/Other Studies Reviewed:    The following studies were reviewed today: Echo 08/14/19 1. Left ventricular ejection fraction, by estimation, is 50 to 55%. The  left ventricle has low normal function. The  left ventricle has no regional  wall motion abnormalities. Left ventricular diastolic parameters are  consistent with Grade I diastolic  dysfunction (impaired relaxation). The average left ventricular global  longitudinal strain is -25.1 %.  2. Right ventricular systolic function is normal. The right ventricular  size is normal. There is normal pulmonary artery systolic pressure.  3. The mitral valve is normal in structure. No evidence of mitral valve  regurgitation. No evidence of mitral stenosis.  4. The aortic valve is normal in structure. Aortic valve regurgitation is  mild. No aortic stenosis is present.  5. Aortic root/ascending aorta has been repaired/replaced. 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   Comparison(s): 02/26/17 EF 60-65%. PA pressure 71mHg.   TEE 02/2017 - Left ventricle: Systolic function was normal. Wall motion was normal; there were no regional wall motion abnormalities. - Aortic valve: There was mild regurgitation. - Mitral valve: There was trivial  regurgitation. - Left atrium: No evidence of thrombus in the atrial cavity or appendage. No evidence of thrombus in the atrial cavity or appendage. - Right ventricle: The cavity size was normal. Wall thickness was normal. Systolic function was normal. - Right atrium: No evidence of thrombus in the atrial cavity or appendage. - Atrial septum: A patent foramen ovale is likely. There was no right to left flow at rest but saline microcavitation study was positive with abdominal pressure. The test was repeated to ensure that there was no extracardiac shunt and was negative. - Tricuspid valve: There was trivial regurgitation.  TTE 02/2017 Study Conclusions  - Left ventricle: The cavity size was normal. Wall thickness was normal. Systolic function was normal. The estimated ejection fraction was in the range of 60% to 65%. Wall motion was normal; there were no regional wall motion abnormalities. Doppler parameters are consistent with abnormal left ventricular relaxation (grade 1 diastolic dysfunction). The E/e&' ratio is <8, suggesting normal LV filling pressure. - Aortic valve: Sclerosis without stenosis. There was trivial regurgitation. - Left atrium: The atrium was normal in size. - Tricuspid valve: There was trivial regurgitation. - Pulmonary arteries: PA peak pressure: 30 mm Hg (S). - Inferior vena cava: The vessel was normal in size. The respirophasic diameter changes were in the normal range (>= 50%), consistent with normal central venous pressure.  Impressions:  - Compared to a prior study in 2018, the LVEF is higher at 60-65% and there is now grade 1 DD with normal LV filling pressure.  EKG:  EKG is personally reviewed.  The ekg ordered 08/01/19 demonstrates atrial-sensed, ventricular-paced rhythm at 73 bpm  Recent Labs: No results found for requested labs within last 8760 hours.  Recent Lipid Panel    Component Value Date/Time   CHOL   02/11/2010 0555    103        ATP III CLASSIFICATION:  <200     mg/dL   Desirable  200-239  mg/dL   Borderline High  >=240    mg/dL   High          TRIG 85 02/11/2010 0555   HDL 42 02/11/2010 0555   CHOLHDL 2.5 02/11/2010 0555   VLDL 17 02/11/2010 0555   LDLCALC  02/11/2010 0555    44        Total Cholesterol/HDL:CHD Risk Coronary Heart Disease Risk Table                     Men   Women  1/2 Average Risk  3.4   3.3  Average Risk       5.0   4.4  2 X Average Risk   9.6   7.1  3 X Average Risk  23.4   11.0        Use the calculated Patient Ratio above and the CHD Risk Table to determine the patient's CHD Risk.        ATP III CLASSIFICATION (LDL):  <100     mg/dL   Optimal  100-129  mg/dL   Near or Above                    Optimal  130-159  mg/dL   Borderline  160-189  mg/dL   High  >190     mg/dL   Very High    Physical Exam:    VS:  BP 109/69   Pulse 96   Ht '5\' 7"'  (1.702 m)   Wt 160 lb (72.6 kg)   BMI 25.06 kg/m     Wt Readings from Last 3 Encounters:  09/19/19 160 lb (72.6 kg)  08/01/19 164 lb (74.4 kg)  01/14/19 160 lb (72.6 kg)    Speaking comfortably on the phone, no audible wheezing In no acute distress Alert and oriented Normal affect Normal speech  ASSESSMENT:    1. Nonischemic cardiomyopathy ([place])   2. Left bundle branch block   3. Bilateral leg edema   4. AF (paroxysmal atrial fibrillation) ([place])   5. Long term current use of anticoagulant therapy   6. Essential hypertension    PLAN:    Nonischemic cardiomyopathy, LBBB: EF normalized s/p CRT, follows with Dr. Lovena Le -symptoms improved, now NYHA class II -continue spironolactone -on propranolol 60 mg daily. We have discussed cutting this back in the past due to low BP, but it helps her tremor, and she would like to continue if possible -reviewed echo results today. EF 50-55%  Bilateral LE edema: -reports improvement with elevation, compression stockings -echo as above  Hypertension:   -better compared to prior hypotension. Continue to monitor.  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  -It was never fully determined what caused her splenic infarction, as she had a possible PFO on TEE but also had only very brief afib seen on her device. Dr. Wynonia Lawman previously started apixaban, and she is tolerating this without issue. She prefers to be on anticoagulation vs. Risking another embolic event given the uncertain cause.  Cardiac risk counseling and prevention recommendations: -recommend heart healthy/Mediterranean diet, with whole grains, fruits, vegetable, fish, lean meats, nuts, and olive oil. Limit salt. -recommend moderate walking, 3-5 times/week for 30-50 minutes each session. Aim for at least 150 minutes.week. Goal should be pace of 3 miles/hours, or walking 1.5 miles in 30 minutes -recommend avoidance of tobacco products. Avoid excess alcohol.  Plan for follow up: 6 mos  Today, I have spent 9 minutes with the patient with telehealth technology discussing the above problems.  Additional time spent in chart review, documentation, and communication.  Buford Dresser, MD, PhD [place]  [facility]   Medication Adjustments/Labs and Tests Ordered: Current medicines are reviewed at length with the patient today.  Concerns regarding medicines are outlined above.  No orders of the defined types were placed in this encounter.  No orders of the defined types were placed in this encounter.   Patient Instructions  Medication Instructions:  Your Physician recommend you continue on your  current medication as directed.    *If you need a refill on your cardiac medications before your next appointment, please call your pharmacy*   Lab Work: None ordered   Testing/Procedures: None ordered    Follow-Up: At [facility], you and your health needs are our priority.  As part of our  continuing mission to provide you with exceptional heart care, we have created designated Provider Care Teams.  These Care Teams include your primary Cardiologist (physician) and Advanced Practice Providers (APPs -  Physician Assistants and Nurse Practitioners) who all work together to provide you with the care you need, when you need it.  We recommend signing up for the patient portal called "MyChart".  Sign up information is provided on this After Visit Summary.  MyChart is used to connect with patients for Virtual Visits (Telemedicine).  Patients are able to view lab/test results, encounter notes, upcoming appointments, etc.  Non-urgent messages can be sent to your provider as well.   To learn more about what you can do with MyChart, go to [email or web address].    Your next appointment:   6 month(s)  The format for your next appointment:   In Person  Provider:   Buford Dresser, MD      Signed, Buford Dresser, MD PhD 09/19/2019  [place]

## 2019-09-19 NOTE — Patient Instructions (Signed)

## 2019-09-20 DIAGNOSIS — M542 Cervicalgia: Secondary | ICD-10-CM | POA: Diagnosis not present

## 2019-09-25 DIAGNOSIS — R300 Dysuria: Secondary | ICD-10-CM | POA: Diagnosis not present

## 2019-09-30 DIAGNOSIS — E538 Deficiency of other specified B group vitamins: Secondary | ICD-10-CM | POA: Diagnosis not present

## 2019-09-30 DIAGNOSIS — S81819A Laceration without foreign body, unspecified lower leg, initial encounter: Secondary | ICD-10-CM | POA: Diagnosis not present

## 2019-10-27 ENCOUNTER — Encounter: Payer: Self-pay | Admitting: Cardiology

## 2019-10-30 DIAGNOSIS — L84 Corns and callosities: Secondary | ICD-10-CM | POA: Diagnosis not present

## 2019-10-30 DIAGNOSIS — I739 Peripheral vascular disease, unspecified: Secondary | ICD-10-CM | POA: Diagnosis not present

## 2019-10-30 DIAGNOSIS — L603 Nail dystrophy: Secondary | ICD-10-CM | POA: Diagnosis not present

## 2019-11-01 DIAGNOSIS — H04123 Dry eye syndrome of bilateral lacrimal glands: Secondary | ICD-10-CM | POA: Diagnosis not present

## 2019-11-05 ENCOUNTER — Other Ambulatory Visit: Payer: Self-pay

## 2019-11-05 ENCOUNTER — Ambulatory Visit: Payer: Medicare PPO | Admitting: Plastic Surgery

## 2019-11-05 ENCOUNTER — Encounter: Payer: Self-pay | Admitting: Plastic Surgery

## 2019-11-05 VITALS — BP 100/64 | HR 60 | Temp 97.9°F | Ht 67.0 in | Wt 162.0 lb

## 2019-11-05 DIAGNOSIS — L719 Rosacea, unspecified: Secondary | ICD-10-CM | POA: Insufficient documentation

## 2019-11-05 DIAGNOSIS — Z7901 Long term (current) use of anticoagulants: Secondary | ICD-10-CM

## 2019-11-05 DIAGNOSIS — I480 Paroxysmal atrial fibrillation: Secondary | ICD-10-CM | POA: Diagnosis not present

## 2019-11-05 DIAGNOSIS — L905 Scar conditions and fibrosis of skin: Secondary | ICD-10-CM | POA: Insufficient documentation

## 2019-11-05 DIAGNOSIS — G500 Trigeminal neuralgia: Secondary | ICD-10-CM

## 2019-11-05 NOTE — Progress Notes (Signed)
Patient ID: [name], female    DOB: [birth year], 64 y.o.   MRN: [id number]   Chief Complaint  Patient presents with  . Consult  . Skin Problem    The patient is a 64 year old female here for an evaluation of her face. She had excision of a basal cell carcinoma of her right upper lip. This was almost a year ago. She has a little bit of asymmetry and pulling of the right upper lip in the superior direction. She understands that Dr. Garth Schlatter would like to do a Z-plasty. She would like to avoid surgery if possible. She also describes what sounds like dry eye syndrome and rosacea. We talked about the options she would like to be as minimally invasive as possible. The scar is in the longitudinal direction of her right midfoot area and tracks 3 cm superiorly from the red border of her left.   Review of Systems  Constitutional: Negative.   HENT: Negative.   Eyes: Negative.   Respiratory: Negative.  Negative for cough.   Cardiovascular: Negative.   Gastrointestinal: Negative.  Negative for abdominal pain.  Endocrine: Negative.   Genitourinary: Negative.   Musculoskeletal: Negative.     Past Medical History:  Diagnosis Date  . AF (paroxysmal atrial fibrillation) ([place])   . Arthritis   . Basal cell carcinoma 11/11/1998   left forehead-tx dr Tonia Brooms  . BCC (basal cell carcinoma) 03/11/2003   left distal lower leg ankle-tx dr Tonia Brooms  . BCC (basal cell carcinoma) 11/19/2009   left anterior lat distal lower leg  . Biventricular cardiac pacemaker in situ    a. prior CRT-D in 2013 with downgrade to CRT-Pacemaker only in 11/2016.  Marland Kitchen Chronic systolic CHF (congestive heart failure) ([place])   . Complication of anesthesia    aspiration  . Essential tremor   . Fibroids   . GERD (gastroesophageal reflux disease)    otc  . Hypertension   . Hyperthyroidism   . Hypothyroidism   . LBBB (left bundle branch block)    conduction disorder  . Migraines   . Mild aortic insufficiency   . NICM  (nonischemic cardiomyopathy) ([place])    a. 2012: normal coronaries, EF 25-30%. b. improved to 55% 11/2016.  . Osteoporosis   . Pleural effusion, right    thoracentesis 02/09/09   . Raynaud's disease   . SCC (squamous cell carcinoma) 07/27/2009   left preauricular-dr gruber  . SCC (squamous cell carcinoma) 08/20/2018   right upper lip-mohs Dr.mitkov  . Scoliosis   . Skin cancer    "squamous cell on nose, forehead, & left leg"  . Squamous cell carcinoma of skin 07/14/2004   right dorsal lateral upper nose-mohs    Past Surgical History:  Procedure Laterality Date  . ARTERY BIOPSY  12/29/2011   Procedure: MINOR BIOPSY TEMPORAL ARTERY;  Surgeon: Edward Jolly, MD;  Location: [facility];  Service: General;  Laterality: Right;  Right temporal artery biopsy  . BACK SURGERY  12/31/2009   "for flat back syndrome; fused bottom of my back"  . BI-VENTRICULAR IMPLANTABLE CARDIOVERTER DEFIBRILLATOR Left 01/20/2011   Procedure: BI-VENTRICULAR IMPLANTABLE CARDIOVERTER DEFIBRILLATOR  (CRT-D);  Surgeon: Evans Lance, MD;  Location: [facility] CATH LAB;  Service: Cardiovascular;  Laterality: Left;  . BIV ICD GENERATOR CHANGEOUT N/A 11/24/2016   Procedure: BIV ICD GENERATOR CHANGEOUT;  Surgeon: Evans Lance, MD;  Location: [facility] CV LAB;  Service: Cardiovascular;  Laterality: N/A;  . CHOLECYSTECTOMY  ~1996  .  FOOT NEUROMA SURGERY  ~ 2003   right  . ROTATOR CUFF REPAIR  ~ 2009   left  . SHOULDER ARTHROSCOPY W/ ROTATOR CUFF REPAIR  09/21/2011  . SKIN CANCER EXCISION     nose, forehead, left leg  . TEE WITHOUT CARDIOVERSION N/A 03/01/2017   Procedure: TRANSESOPHAGEAL ECHOCARDIOGRAM (TEE);  Surgeon: Skeet Latch, MD;  Location: [facility];  Service: Cardiovascular;  Laterality: N/A;  . THYROID LOBECTOMY  ~ 2006   right  . TUBAL LIGATION  ~ 1983  . VAGINAL HYSTERECTOMY  ~ 2009  . VESICOVAGINAL FISTULA CLOSURE W/ TAH        Current Outpatient Medications:  .  amitriptyline  (ELAVIL) 25 MG tablet, Take 30m by mouth every night, Disp: , Rfl: 5 .  CALCIUM PO, Take 2 tablets by mouth daily., Disp: , Rfl:  .  Carboxymethylcellul-Glycerin (LUBRICATING EYE DROPS OP), Place 1 drop into both eyes 2 (two) times daily. , Disp: , Rfl:  .  Cyanocobalamin (B-12 COMPLIANCE INJECTION) 1000 MCG/ML KIT, Inject 1,000 mcg every 30 (thirty) days as directed., Disp: , Rfl:  .  eletriptan (RELPAX) 40 MG tablet, Take 40 mg by mouth daily as needed for migraine. May repeat in 2 hours if necessary, Disp: , Rfl:  .  ELIQUIS 5 MG TABS tablet, TAKE 1 TABLET BY MOUTH TWICE A DAY, Disp: 60 tablet, Rfl: 8 .  gabapentin (NEURONTIN) 600 MG tablet, Take 600 mg by mouth 2 (two) times daily., Disp: , Rfl:  .  levothyroxine (SYNTHROID, LEVOTHROID) 100 MCG tablet, Take 100 mcg daily before breakfast by mouth., Disp: , Rfl:  .  Melatonin 3 MG TABS, Take 9 mg by mouth at bedtime. , Disp: , Rfl:  .  montelukast (SINGULAIR) 10 MG tablet, Take 10 mg by mouth daily., Disp: , Rfl:  .  polyethylene glycol (MIRALAX / GLYCOLAX) packet, Take 17 g every evening by mouth., Disp: , Rfl:  .  propranolol (INDERAL) 60 MG tablet, Take 60 mg by mouth daily., Disp: , Rfl:  .  spironolactone (ALDACTONE) 25 MG tablet, TAKE 1/2 TABLET BY MOUTH TWICE A DAY, Disp: 90 tablet, Rfl: 3 .  VITAMIN D PO, Take 2 capsules by mouth daily., Disp: , Rfl:    Objective:   Vitals:   11/05/19 1030  BP: 100/64  Pulse: 60  Temp: 97.9 F (36.6 C)  SpO2: 97%    Physical Exam Vitals and nursing note reviewed.  Constitutional:      Appearance: Normal appearance.  HENT:     Head: Normocephalic and atraumatic.  Cardiovascular:     Rate and Rhythm: Normal rate.     Pulses: Normal pulses.  Pulmonary:     Effort: Pulmonary effort is normal.  Skin:    General: Skin is warm.     Capillary Refill: Capillary refill takes less than 2 seconds.  Neurological:     General: No focal deficit present.     Mental Status: She is alert and  oriented to person, place, and time.  Psychiatric:        Mood and Affect: Mood normal.        Behavior: Behavior normal.        Thought Content: Thought content normal.     Assessment & Plan:  Paroxysmal atrial fibrillation (HCC)  Trigeminal neuralgia  Long term current use of anticoagulant therapy  Rosacea  Scar  Recommend continuing massage to the area. We can try to see if the laser will help loosen and lighten  up the scar. Ultimately she may need a Z-plasty. She would like to try the laser because she also has dry eyes due to rosacea and this might be helpful. She is certainly willing to try. I think that it is a reasonable thing to do. We will provide her with a quote and options for starting her laser treatment.  Pictures were obtained of the patient and placed in the chart with the patient's or guardian's permission.   [place], DO

## 2019-11-06 DIAGNOSIS — E538 Deficiency of other specified B group vitamins: Secondary | ICD-10-CM | POA: Diagnosis not present

## 2019-11-06 DIAGNOSIS — Z23 Encounter for immunization: Secondary | ICD-10-CM | POA: Diagnosis not present

## 2019-11-11 DIAGNOSIS — N8111 Cystocele, midline: Secondary | ICD-10-CM | POA: Diagnosis not present

## 2019-11-11 DIAGNOSIS — Z780 Asymptomatic menopausal state: Secondary | ICD-10-CM | POA: Diagnosis not present

## 2019-11-18 DIAGNOSIS — D692 Other nonthrombocytopenic purpura: Secondary | ICD-10-CM | POA: Diagnosis not present

## 2019-11-18 DIAGNOSIS — G6289 Other specified polyneuropathies: Secondary | ICD-10-CM | POA: Diagnosis not present

## 2019-11-26 ENCOUNTER — Ambulatory Visit (INDEPENDENT_AMBULATORY_CARE_PROVIDER_SITE_OTHER): Payer: Medicare PPO

## 2019-11-26 DIAGNOSIS — I428 Other cardiomyopathies: Secondary | ICD-10-CM

## 2019-11-26 LAB — CUP PACEART REMOTE DEVICE CHECK
Battery Remaining Longevity: 82 mo
Battery Voltage: 2.98 V
Brady Statistic AP VP Percent: 2.42 %
Brady Statistic AP VS Percent: 0.07 %
Brady Statistic AS VP Percent: 95.81 %
Brady Statistic AS VS Percent: 1.7 %
Brady Statistic RA Percent Paced: 2.49 %
Brady Statistic RV Percent Paced: 5.74 %
Date Time Interrogation Session: 20211116080154
Implantable Lead Implant Date: 20130110
Implantable Lead Implant Date: 20130110
Implantable Lead Implant Date: 20130110
Implantable Lead Location: 753858
Implantable Lead Location: 753859
Implantable Lead Location: 753860
Implantable Lead Model: 4196
Implantable Lead Model: 5076
Implantable Lead Model: 6935
Implantable Pulse Generator Implant Date: 20181115
Lead Channel Impedance Value: 1007 Ohm
Lead Channel Impedance Value: 1425 Ohm
Lead Channel Impedance Value: 342 Ohm
Lead Channel Impedance Value: 418 Ohm
Lead Channel Impedance Value: 665 Ohm
Lead Channel Impedance Value: 741 Ohm
Lead Channel Impedance Value: 741 Ohm
Lead Channel Impedance Value: 779 Ohm
Lead Channel Impedance Value: 893 Ohm
Lead Channel Pacing Threshold Amplitude: 0.625 V
Lead Channel Pacing Threshold Amplitude: 1.625 V
Lead Channel Pacing Threshold Amplitude: 2.5 V
Lead Channel Pacing Threshold Pulse Width: 0.4 ms
Lead Channel Pacing Threshold Pulse Width: 0.4 ms
Lead Channel Pacing Threshold Pulse Width: 0.8 ms
Lead Channel Sensing Intrinsic Amplitude: 0.875 mV
Lead Channel Sensing Intrinsic Amplitude: 0.875 mV
Lead Channel Sensing Intrinsic Amplitude: 17.625 mV
Lead Channel Sensing Intrinsic Amplitude: 17.625 mV
Lead Channel Setting Pacing Amplitude: 2 V
Lead Channel Setting Pacing Amplitude: 2.75 V
Lead Channel Setting Pacing Amplitude: 4 V
Lead Channel Setting Pacing Pulse Width: 0.8 ms
Lead Channel Setting Pacing Pulse Width: 1 ms
Lead Channel Setting Sensing Sensitivity: 0.9 mV

## 2019-11-27 DIAGNOSIS — Z1231 Encounter for screening mammogram for malignant neoplasm of breast: Secondary | ICD-10-CM | POA: Diagnosis not present

## 2019-11-28 NOTE — Progress Notes (Signed)
Remote pacemaker transmission.   

## 2019-11-29 DIAGNOSIS — E538 Deficiency of other specified B group vitamins: Secondary | ICD-10-CM | POA: Diagnosis not present

## 2019-12-03 ENCOUNTER — Telehealth: Payer: Self-pay | Admitting: Cardiology

## 2019-12-03 DIAGNOSIS — R928 Other abnormal and inconclusive findings on diagnostic imaging of breast: Secondary | ICD-10-CM | POA: Diagnosis not present

## 2019-12-03 DIAGNOSIS — R921 Mammographic calcification found on diagnostic imaging of breast: Secondary | ICD-10-CM | POA: Diagnosis not present

## 2019-12-03 DIAGNOSIS — R922 Inconclusive mammogram: Secondary | ICD-10-CM | POA: Diagnosis not present

## 2019-12-03 NOTE — Telephone Encounter (Signed)
   [facility] Pre-operative Risk Assessment    HEARTCARE STAFF: - Please ensure there is not already an duplicate clearance open for this procedure. - Under Visit Info/Reason for Call, type in Other and utilize the format Clearance MM/DD/YY or Clearance TBD. Do not use dashes or single digits. - If request is for dental extraction, please clarify the # of teeth to be extracted.  Request for surgical clearance:  1. What type of surgery is being performed? Needle core breast biopsy   2. When is this surgery scheduled? 12/12/2019   3. What type of clearance is required (medical clearance vs. Pharmacy clearance to hold med vs. Both)? Pharmacy  4. Are there any medications that need to be held prior to surgery and how long? Hold eliquis 4-5 days  5. Practice name and name of physician performing surgery? [facility], Dr. Isaiah Blakes  6. What is the office phone number? [phone]   7.   What is the office fax number? [phone]  8.   Anesthesia type (None, local, MAC, general) ? none   [name] 12/03/2019, 4:46 PM  _________________________________________________________________   (provider comments below)

## 2019-12-03 NOTE — Telephone Encounter (Signed)
Pharmacy, can you please comment on how long patient can hold Eliquis for upcoming biopsy?  Thank you!

## 2019-12-04 NOTE — Telephone Encounter (Signed)
Patient with diagnosis of afib and splenic infarction on Eliquis for anticoagulation.    Procedure: Needle core breast biopsy  Date of procedure: 12/12/2019    CHA2DS2-VASc Score = 4  This indicates a 4.8% annual risk of stroke. The patient's score is based upon: CHF History: 1 HTN History: 1 Diabetes History: 0 Stroke History: 0 Vascular Disease History: 0 Age Score: 1 Gender Score: 1      CrCl 53.8 ml/min  MD is requesting a 4-5 day hold. Standard hold for a biopsy is 2 days. Pt has normal renal function. Since MD is requesting a longer hold, and pt has a hx of splenic infarct- I will send to Dr. Harrell Gave for her input.

## 2019-12-04 NOTE — Telephone Encounter (Signed)
   Primary Cardiologist: Buford Dresser, MD  Chart reviewed as part of pre-operative protocol coverage. Patient is scheduled for a needle core breast biopsy on 12/12/2019 and we were asked for our recommendations for holding Eliquis.   I spoke with Dr. Harrell Gave who stated it is OK to hold Eliquis for 4-5 days as requested for biopsy. This should be restarted as soon as possible following procedure.  I will route this recommendation to the requesting party via Epic fax function and remove from pre-op pool.  Please call with questions.  Darreld Mclean, PA-C 12/04/2019, 11:16 AM

## 2019-12-12 ENCOUNTER — Other Ambulatory Visit: Payer: Self-pay

## 2019-12-12 DIAGNOSIS — C50919 Malignant neoplasm of unspecified site of unspecified female breast: Secondary | ICD-10-CM

## 2019-12-12 DIAGNOSIS — R921 Mammographic calcification found on diagnostic imaging of breast: Secondary | ICD-10-CM | POA: Diagnosis not present

## 2019-12-12 DIAGNOSIS — D0512 Intraductal carcinoma in situ of left breast: Secondary | ICD-10-CM | POA: Diagnosis not present

## 2019-12-12 HISTORY — DX: Malignant neoplasm of unspecified site of unspecified female breast: C50.919

## 2019-12-23 ENCOUNTER — Ambulatory Visit: Payer: Self-pay | Admitting: Surgery

## 2019-12-23 DIAGNOSIS — D0512 Intraductal carcinoma in situ of left breast: Secondary | ICD-10-CM

## 2019-12-23 NOTE — H&P (View-Only) (Signed)
History of Present Illness [name]. Dana Duesing Schmidt; 12/23/2019 6:55 PM) The patient is a 64 year old female who presents with breast cancer. PCP - Dr. Aura Schmidt Films - [name]  This is a 64 year old female with multiple medical issues who presents after recent mammogram revealed some suspicious findings in the left upper outer quadrant at 2:00 posterior depth. She underwent stereotactic biopsy of this area. Pathology confirmed ductal carcinoma in situ, high-grade with necrosis and calcifications. On the biopsy this measures 0.6 cm in greatest linear dimension. Focal microinvasion cannot be ruled out on the biopsy. ER 15% positive PR negative she is referred to [place] for evaluation.   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 Foot Surgery Right. Gallbladder Surgery - Laparoscopic Hysterectomy (not due to cancer) - Complete Shoulder Surgery Right. Spinal Surgery - Lower Back Spinal Surgery Midback Thyroid Surgery  Diagnostic Studies History [name], [place]; 12/23/2019 11:29 AM) Colonoscopy 1-5 years ago Mammogram within last year Pap Smear 1-5 years ago  Allergies [name], CMA; 12/23/2019 11:30 AM) Codeine Phosphate *ANALGESICS - OPIOID* Morphine Sulfate (Bulk) *ANALGESICS - OPIOID* fentaNYL *ANALGESICS - OPIOID* Allergies Reconciled  Medication History [name], CMA; 12/23/2019 11:33 AM) Amitriptyline HCl (25MG  Tablet, Oral) Active. Eletriptan Hydrobromide (40MG  Tablet, Oral) Active. Eliquis (5MG  Tablet, Oral) Active. Estradiol (0.1MG /GM Cream, Vaginal) Active. Fish Oil (Oral) Specific strength unknown - Active. Gabapentin (600MG  Tablet, Oral) Active. Levothyroxine Sodium (100MCG Tablet, Oral) Active. Melatonin (10MG  Capsule, Oral) Active. MiraLax (17GM  Packet, Oral) Active. Propranolol HCl (60MG  Tablet, Oral) Active. Spironolactone (25MG  Tablet, Oral) Active. Restasis (0.05% Emulsion, Ophthalmic) Active. Loratadine (10MG  Tablet, Oral) Active. Acetaminophen (500MG  Capsule, Oral) Active. Vitamin C (1 (one) Oral) Specific strength unknown - Active. Calcium/Vitamin D/Minerals (600-200MG -UNIT Tablet, 1 (one) Oral) Active. Medications Reconciled  Social History [name], [place]; 12/23/2019 11:29 AM) Caffeine use Carbonated beverages, Tea. No alcohol use No drug use Tobacco use Never smoker.  Family History [name], [place]; 12/23/2019 11:29 AM) Anesthetic complications Mother, Sister. Arthritis Mother, Sister. Depression Sister. Diabetes Mellitus Sister. Heart Disease Father, Mother. Hypertension Mother, Sister. Migraine Headache Father, Sister. Prostate Cancer Father. Thyroid problems Sister.  Pregnancy / Birth History [name], [place]; 12/23/2019 11:29 AM) Age at menarche 53 years. Age of menopause 51-55 Contraceptive History Oral contraceptives. Gravida 1 Irregular periods Maternal age 48-30 Para 1  Other Problems [name]. Dana Salada, Schmidt; 12/23/2019 6:56 PM) Anxiety Disorder Atrial Fibrillation Back Pain Breast Cancer Cancer Cholelithiasis General anesthesia - complications Hemorrhoids High blood pressure Migraine Headache Thyroid Disease     Review of Systems [name] CMA; 12/23/2019 11:29 AM) General Not Present- Appetite Loss, Chills, Fatigue, Fever, Night Sweats, Weight Gain and Weight Loss. Skin Present- Dryness. Not Present- Change in Wart/Mole, Hives, Jaundice, New Lesions, Non-Healing Wounds, Rash and Ulcer. HEENT Present- Seasonal Allergies and Wears glasses/contact lenses. Not Present- Earache, Hearing Loss, Hoarseness, Nose Bleed, Oral Ulcers, Ringing in the Ears, Sinus Pain, Sore Throat, Visual Disturbances and Yellow Eyes. Respiratory Not  Present- Bloody sputum, Chronic Cough, Difficulty Breathing, Snoring and Wheezing. Breast Not Present- Breast Mass, Breast Pain, Nipple Discharge and Skin Changes. Cardiovascular Not Present- Chest Pain, Difficulty Breathing Lying Down, Leg Cramps, Palpitations, Rapid Heart Rate, Shortness of Breath and Swelling of Extremities. Gastrointestinal Not Present- Abdominal Pain, Bloating, Bloody Stool, Change in Bowel Habits, Chronic diarrhea, Constipation, Difficulty Swallowing, Excessive gas, Gets full  quickly at meals, Hemorrhoids, Indigestion, Nausea, Rectal Pain and Vomiting. Female Genitourinary Not Present- Frequency, Nocturia, Painful Urination, Pelvic Pain and Urgency. Neurological Present- Headaches. Not Present- Decreased Memory, Fainting, Numbness, Seizures, Tingling, Tremor, Trouble walking and Weakness. Psychiatric Not Present- Anxiety, Bipolar, Change in Sleep Pattern, Depression, Fearful and Frequent crying. Endocrine Not Present- Cold Intolerance, Excessive Hunger, Hair Changes, Heat Intolerance, Hot flashes and New Diabetes. Hematology Present- Blood Thinners and Easy Bruising. Not Present- Excessive bleeding, Gland problems, HIV and Persistent Infections.  Vitals [name] CMA; 12/23/2019 11:34 AM) 12/23/2019 11:33 AM Weight: 162.25 lb Height: 67in Body Surface Area: 1.85 m Body Mass Index: 25.41 kg/m  Temp.: 97.3F  Pulse: 51 (Regular)  P.OX: 94% (Room air) BP: 110/80(Sitting, Left Arm, Standard)        Physical Exam Dana Key K. Iyonnah Ferrante Schmidt; 12/23/2019 6:57 PM)  The physical exam findings are as follows: Note:Constitutional: WDWN in NAD, conversant, no obvious deformities; resting comfortably Eyes: Pupils equal, round; sclera anicteric; moist conjunctiva; no lid lag HENT: Oral mucosa moist; good dentition Neck: No masses palpated, trachea midline; no thyromegaly Breasts: symmetric; slight bruising in left upper outer quadrant; palpable hematoma under the  biopsy site; no nipple changes; no axillary lymphadenopathy; no right breast masses Lungs: CTA bilaterally; normal respiratory effort CV: Regular rate and rhythm; no murmurs; extremities well-perfused with no edema Abd: +bowel sounds, soft, non-tender, no palpable organomegaly; no palpable hernias Musc: Normal gait; no apparent clubbing or cyanosis in extremities Lymphatic: No palpable cervical or axillary lymphadenopathy Skin: Warm, dry; no sign of jaundice Psychiatric - alert and oriented x 4; calm mood and affect    Assessment & Plan Dana Key K. Maylen Waltermire Schmidt; 12/23/2019 6:48 PM)  DUCTAL CARCINOMA IN SITU (DCIS) OF LEFT BREAST (D05.12)  Current Plans Schedule for Surgery - Left radioactive seed localized lumpectomy. The surgical procedure has been discussed with the patient. Potential risks, benefits, alternative treatments, and expected outcomes have been explained. All of the patient's questions at this time have been answered. The likelihood of reaching the patient's treatment goal is good. The patient understand the proposed surgical procedure and wishes to proceed.  Cardiac clearance first Hold Eliquis 5 days prior to surgery   [name]. Georgette Dover, Schmidt, [facility] Surgery  General/ Trauma Surgery   12/23/2019 6:57 PM

## 2019-12-23 NOTE — H&P (Signed)
History of Present Illness [name]. Dana Goheen MD; 12/23/2019 6:55 PM) The patient is a 64 year old female who presents with breast cancer. PCP - Dr. Aura Schmidt Films - [name]  This is a 64 year old female with multiple medical issues who presents after recent mammogram revealed some suspicious findings in the left upper outer quadrant at 2:00 posterior depth. She underwent stereotactic biopsy of this area. Pathology confirmed ductal carcinoma in situ, high-grade with necrosis and calcifications. On the biopsy this measures 0.6 cm in greatest linear dimension. Focal microinvasion cannot be ruled out on the biopsy. ER 15% positive PR negative she is referred to [place] for evaluation.   Problem List/Past Medical Dana Key K. 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 Foot Surgery Right. Gallbladder Surgery - Laparoscopic Hysterectomy (not due to cancer) - Complete Shoulder Surgery Right. Spinal Surgery - Lower Back Spinal Surgery Midback Thyroid Surgery  Diagnostic Studies History [name], [place]; 12/23/2019 11:29 AM) Colonoscopy 1-5 years ago Mammogram within last year Pap Smear 1-5 years ago  Allergies [name], CMA; 12/23/2019 11:30 AM) Codeine Phosphate *ANALGESICS - OPIOID* Morphine Sulfate (Bulk) *ANALGESICS - OPIOID* fentaNYL *ANALGESICS - OPIOID* Allergies Reconciled  Medication History [name], CMA; 12/23/2019 11:33 AM) Amitriptyline HCl (25MG  Tablet, Oral) Active. Eletriptan Hydrobromide (40MG  Tablet, Oral) Active. Eliquis (5MG  Tablet, Oral) Active. Estradiol (0.1MG /GM Cream, Vaginal) Active. Fish Oil (Oral) Specific strength unknown - Active. Gabapentin (600MG  Tablet, Oral) Active. Levothyroxine Sodium (100MCG Tablet, Oral) Active. Melatonin (10MG  Capsule, Oral) Active. MiraLax (17GM  Packet, Oral) Active. Propranolol HCl (60MG  Tablet, Oral) Active. Spironolactone (25MG  Tablet, Oral) Active. Restasis (0.05% Emulsion, Ophthalmic) Active. Loratadine (10MG  Tablet, Oral) Active. Acetaminophen (500MG  Capsule, Oral) Active. Vitamin C (1 (one) Oral) Specific strength unknown - Active. Calcium/Vitamin D/Minerals (600-200MG -UNIT Tablet, 1 (one) Oral) Active. Medications Reconciled  Social History [name], [place]; 12/23/2019 11:29 AM) Caffeine use Carbonated beverages, Tea. No alcohol use No drug use Tobacco use Never smoker.  Family History [name], [place]; 12/23/2019 11:29 AM) Anesthetic complications Mother, Sister. Arthritis Mother, Sister. Depression Sister. Diabetes Mellitus Sister. Heart Disease Father, Mother. Hypertension Mother, Sister. Migraine Headache Father, Sister. Prostate Cancer Father. Thyroid problems Sister.  Pregnancy / Birth History [name], [place]; 12/23/2019 11:29 AM) Age at menarche 36 years. Age of menopause 51-55 Contraceptive History Oral contraceptives. Gravida 1 Irregular periods Maternal age 37-30 Para 1  Other Problems [name]. Dana Braunschwe[place], MD; 12/23/2019 6:56 PM) Anxiety Disorder Atrial Fibrillation Back Pain Breast Cancer Cancer Cholelithiasis General anesthesia - complications Hemorrhoids High blood pressure Migraine Headache Thyroid Disease     Review of Systems [name] CMA; 12/23/2019 11:29 AM) General Not Present- Appetite Loss, Chills, Fatigue, Fever, Night Sweats, Weight Gain and Weight Loss. Skin Present- Dryness. Not Present- Change in Wart/Mole, Hives, Jaundice, New Lesions, Non-Healing Wounds, Rash and Ulcer. HEENT Present- Seasonal Allergies and Wears glasses/contact lenses. Not Present- Earache, Hearing Loss, Hoarseness, Nose Bleed, Oral Ulcers, Ringing in the Ears, Sinus Pain, Sore Throat, Visual Disturbances and Yellow Eyes. Respiratory Not  Present- Bloody sputum, Chronic Cough, Difficulty Breathing, Snoring and Wheezing. Breast Not Present- Breast Mass, Breast Pain, Nipple Discharge and Skin Changes. Cardiovascular Not Present- Chest Pain, Difficulty Breathing Lying Down, Leg Cramps, Palpitations, Rapid Heart Rate, Shortness of Breath and Swelling of Extremities. Gastrointestinal Not Present- Abdominal Pain, Bloating, Bloody Stool, Change in Bowel Habits, Chronic diarrhea, Constipation, Difficulty Swallowing, Excessive gas, Gets full  quickly at meals, Hemorrhoids, Indigestion, Nausea, Rectal Pain and Vomiting. Female Genitourinary Not Present- Frequency, Nocturia, Painful Urination, Pelvic Pain and Urgency. Neurological Present- Headaches. Not Present- Decreased Memory, Fainting, Numbness, Seizures, Tingling, Tremor, Trouble walking and Weakness. Psychiatric Not Present- Anxiety, Bipolar, Change in Sleep Pattern, Depression, Fearful and Frequent crying. Endocrine Not Present- Cold Intolerance, Excessive Hunger, Hair Changes, Heat Intolerance, Hot flashes and New Diabetes. Hematology Present- Blood Thinners and Easy Bruising. Not Present- Excessive bleeding, Gland problems, HIV and Persistent Infections.  Vitals [name] CMA; 12/23/2019 11:34 AM) 12/23/2019 11:33 AM Weight: 162.25 lb Height: 67in Body Surface Area: 1.85 m Body Mass Index: 25.41 kg/m  Temp.: 97.69F  Pulse: 51 (Regular)  P.OX: 94% (Room air) BP: 110/80(Sitting, Left Arm, Standard)        Physical Exam Dana Key K. Fleetwood Pierron MD; 12/23/2019 6:57 PM)  The physical exam findings are as follows: Note:Constitutional: WDWN in NAD, conversant, no obvious deformities; resting comfortably Eyes: Pupils equal, round; sclera anicteric; moist conjunctiva; no lid lag HENT: Oral mucosa moist; good dentition Neck: No masses palpated, trachea midline; no thyromegaly Breasts: symmetric; slight bruising in left upper outer quadrant; palpable hematoma under the  biopsy site; no nipple changes; no axillary lymphadenopathy; no right breast masses Lungs: CTA bilaterally; normal respiratory effort CV: Regular rate and rhythm; no murmurs; extremities well-perfused with no edema Abd: +bowel sounds, soft, non-tender, no palpable organomegaly; no palpable hernias Musc: Normal gait; no apparent clubbing or cyanosis in extremities Lymphatic: No palpable cervical or axillary lymphadenopathy Skin: Warm, dry; no sign of jaundice Psychiatric - alert and oriented x 4; calm mood and affect    Assessment & Plan Dana Key K. Avanti Jetter MD; 12/23/2019 6:48 PM)  DUCTAL CARCINOMA IN SITU (DCIS) OF LEFT BREAST (D05.12)  Current Plans Schedule for Surgery - Left radioactive seed localized lumpectomy. The surgical procedure has been discussed with the patient. Potential risks, benefits, alternative treatments, and expected outcomes have been explained. All of the patient's questions at this time have been answered. The likelihood of reaching the patient's treatment goal is good. The patient understand the proposed surgical procedure and wishes to proceed.  Cardiac clearance first Hold Eliquis 5 days prior to surgery   [name]. Georgette Dover, MD, [facility] Surgery  General/ Trauma Surgery   12/23/2019 6:57 PM

## 2019-12-24 ENCOUNTER — Other Ambulatory Visit: Payer: Medicare PPO | Admitting: Plastic Surgery

## 2019-12-26 ENCOUNTER — Encounter: Payer: Self-pay | Admitting: Hematology and Oncology

## 2019-12-26 ENCOUNTER — Telehealth: Payer: Self-pay | Admitting: Hematology and Oncology

## 2019-12-26 NOTE — Telephone Encounter (Signed)
Received a new patient referral from Dr. Gershon Crane for dx of breast cancer. Ms. Snooks has been cld and scheduled to see Dr. Lindi Adie on 12/23 at 1pm. Pt aware to arrive 30 minuets early.

## 2019-12-27 ENCOUNTER — Telehealth: Payer: Self-pay | Admitting: *Deleted

## 2019-12-27 NOTE — Telephone Encounter (Addendum)
Patient with diagnosis of afib on Eliquis for anticoagulation.    Procedure: left breast lumpectomy Date of procedure: TBD  CHA2DS2-VASc Score = 4  This indicates a 4.8% annual risk of stroke. The patient's score is based upon: CHF History: Yes HTN History: Yes Diabetes History: No Stroke History: No Vascular Disease History: No Age Score: 1 Gender Score: 1   Also has history of splenic infarct in 2019  CrCl 1mL/min Platelet count 306K in 2019  Per office protocol, patient can hold Eliquis for 2-3 days prior to procedure.

## 2019-12-27 NOTE — Telephone Encounter (Signed)
   [facility] Pre-operative Risk Assessment    HEARTCARE STAFF: - Please ensure there is not already an duplicate clearance open for this procedure. - Under Visit Info/Reason for Call, type in Other and utilize the format Clearance MM/DD/YY or Clearance TBD. Do not use dashes or single digits. - If request is for dental extraction, please clarify the # of teeth to be extracted.  Request for surgical clearance:  1. What type of surgery is being performed? LEFT BREASTLUMPECTOMY   2. When is this surgery scheduled? TBD   3. What type of clearance is required (medical clearance vs. Pharmacy clearance to hold med vs. Both)? BOTH  4. Are there any medications that need to be held prior to surgery and how long? ELIQUIS   5. Practice name and name of physician performing surgery? [facility]; DR. Rodman Key TSUEI   6. What is the office phone number? [phone]   7.   What is the office fax number? [place]: Mammie Lorenzo, LPN  8.   Anesthesia type (None, local, MAC, general) ? GENERAL   Julaine Hua 12/27/2019, 3:50 PM  _________________________________________________________________   (provider comments below)

## 2019-12-27 NOTE — Telephone Encounter (Signed)
Clinical pharmacist to address Eliquis

## 2019-12-28 NOTE — Telephone Encounter (Signed)
   Primary Cardiologist: Buford Dresser, MD  Chart reviewed as part of pre-operative protocol coverage. Patient was contacted 12/28/2019 in reference to pre-operative risk assessment for pending surgery as outlined below.  [name] was last seen on 08/01/2019 by Dr. Harrell Schmidt.  Since that day, [name] has done well without chest pain or shortness of breath. She has a history of nonischemic cardiomyopathy with normal coronary artery on cath 2012. EF improved with medical therapy. Echocardiogram obtained on 08/14/2019 continue to show normal EF.   Therefore, based on ACC/AHA guidelines, the patient would be at acceptable risk for the planned procedure without further cardiovascular testing.   See below for recommendation by our clinical pharmacist regarding Eliquis  The patient was advised that if she develops new symptoms prior to surgery to contact our office to arrange for a follow-up visit, and she verbalized understanding.  I will route this recommendation to the requesting party via Epic fax function and remove from pre-op pool. Please call with questions.  [place], [place] 12/28/2019, 3:08 PM

## 2019-12-30 ENCOUNTER — Inpatient Hospital Stay
Admission: RE | Admit: 2019-12-30 | Discharge: 2019-12-30 | Disposition: A | Discharge status: Home / Self Care | Payer: Self-pay | Source: Ambulatory Visit | Attending: Radiation Oncology | Admitting: Radiation Oncology

## 2019-12-30 ENCOUNTER — Other Ambulatory Visit: Payer: Self-pay | Admitting: Radiation Oncology

## 2019-12-30 DIAGNOSIS — D0512 Intraductal carcinoma in situ of left breast: Secondary | ICD-10-CM

## 2019-12-30 DIAGNOSIS — E538 Deficiency of other specified B group vitamins: Secondary | ICD-10-CM | POA: Diagnosis not present

## 2020-01-01 NOTE — Progress Notes (Signed)
[facility] CONSULT NOTE  Patient Care Team: [place], [place] as PCP - General Lovena Le, Champ Mungo, MD as PCP - Electrophysiology (Cardiology) Buford Dresser, MD as PCP - Cardiology (Cardiology)  CHIEF COMPLAINTS/PURPOSE OF CONSULTATION:  Newly diagnosed breast cancer  HISTORY OF PRESENTING ILLNESS:  [name] 64 y.o. female is here because of recent diagnosis of ductal carcinoma in situ of the left breast. Screening mammogram on 11/27/19 showed left breast calcifications. 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 She previously underwent a left breast biopsy of the 2:00 position in 2011. She presents to the clinic today for initial evaluation and discussion of treatment options.   I reviewed her records extensively and collaborated the history with the patient.  SUMMARY OF ONCOLOGIC HISTORY: Oncology History  Ductal carcinoma in situ (DCIS) of left breast  12/12/2019 Initial Diagnosis   Screening mammogram on 11/27/19 showed left breast calcifications. 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 She previously underwent a left breast biopsy of the 2:00 position in 2011.     MEDICAL HISTORY:  Past Medical History:  Diagnosis Date  . AF (paroxysmal atrial fibrillation) ([place])   . Arthritis   . Basal cell carcinoma 11/11/1998   left forehead-tx dr Tonia Brooms  . BCC (basal cell carcinoma) 03/11/2003   left distal lower leg ankle-tx dr Tonia Brooms  . BCC (basal cell carcinoma) 11/19/2009   left anterior lat distal lower leg  . Biventricular cardiac pacemaker in situ    a. prior CRT-D in 2013 with downgrade to CRT-Pacemaker only in 11/2016.  Marland Kitchen Chronic systolic CHF (congestive heart failure) ([place])   . Complication of anesthesia    aspiration  . Essential  tremor   . Fibroids   . GERD (gastroesophageal reflux disease)    otc  . Hypertension   . Hyperthyroidism   . Hypothyroidism   . LBBB (left bundle branch block)    conduction disorder  . Migraines   . Mild aortic insufficiency   . NICM (nonischemic cardiomyopathy) ([place])    a. 2012: normal coronaries, EF 25-30%. b. improved to 55% 11/2016.  . Osteoporosis   . Pleural effusion, right    thoracentesis 02/09/09   . Raynaud's disease   . SCC (squamous cell carcinoma) 07/27/2009   left preauricular-dr gruber  . SCC (squamous cell carcinoma) 08/20/2018   right upper lip-mohs Dr.mitkov  . Scoliosis   . Skin cancer    "squamous cell on nose, forehead, & left leg"  . 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 ARTERY BIOPSY  12/29/2011   Procedure: MINOR BIOPSY TEMPORAL ARTERY;  Surgeon: Edward Jolly, MD;  Location: [facility];  Service: General;  Laterality: Right;  Right temporal artery biopsy  . BACK SURGERY  12/31/2009   "for flat back syndrome; fused bottom of my back"  . BI-VENTRICULAR IMPLANTABLE CARDIOVERTER DEFIBRILLATOR Left 01/20/2011   Procedure: BI-VENTRICULAR IMPLANTABLE CARDIOVERTER DEFIBRILLATOR  (CRT-D);  Surgeon: Evans Lance, MD;  Location: [facility] CATH LAB;  Service: Cardiovascular;  Laterality: Left;  . BIV ICD GENERATOR CHANGEOUT N/A 11/24/2016   Procedure: BIV ICD GENERATOR CHANGEOUT;  Surgeon: Evans Lance, MD;  Location:  [facility] INVASIVE CV LAB;  Service: Cardiovascular;  Laterality: N/A;  . CHOLECYSTECTOMY  ~1996  . FOOT NEUROMA SURGERY  ~ 2003   right  . ROTATOR CUFF REPAIR  ~ 2009   left  . SHOULDER ARTHROSCOPY W/ ROTATOR CUFF REPAIR  09/21/2011  . SKIN CANCER EXCISION     nose, forehead, left leg  . TEE WITHOUT CARDIOVERSION N/A 03/01/2017   Procedure: TRANSESOPHAGEAL ECHOCARDIOGRAM (TEE);  Surgeon: Skeet Latch, MD;   Location: [facility];  Service: Cardiovascular;  Laterality: N/A;  . THYROID LOBECTOMY  ~ 2006   right  . TUBAL LIGATION  ~ 1983  . VAGINAL HYSTERECTOMY  ~ 2009  . VESICOVAGINAL FISTULA CLOSURE W/ TAH      SOCIAL HISTORY: Social History   Socioeconomic History  . Marital status: Married    Spouse name: Ronalee Belts  . Number of children: 1  . Years of education: MA  . Highest education level: Not on file  Occupational History  . Occupation: Pharmacist, hospital    Comment: n/a  Tobacco Use  . Smoking status: Never Smoker  . Smokeless tobacco: Never Used  Vaping Use  . Vaping Use: Never used  Substance and Sexual Activity  . Alcohol use: No  . Drug use: No  . Sexual activity: Yes  Other Topics Concern  . Not on file  Social History Narrative   Patient lives at home with family.   Caffeine Use: 1-2 cups daily   Social Determinants of Health   Financial Resource Strain: Not on file  Food Insecurity: Not on file  Transportation Needs: Not on file  Physical Activity: Not on file  Stress: Not on file  Social Connections: Not on file  Intimate Partner Violence: Not on file    FAMILY HISTORY: Family History  Problem Relation Age of Onset  . Heart disease Mother   . Heart failure Mother 52       CHF  . Stroke Mother   . Hypertension Mother   . Heart disease Father 83  . Macular degeneration Father   . Heart disease Sister   . Atrial fibrillation Sister   . Heart disease Sister     ALLERGIES:  is allergic to other, tape, codeine, fentanyl, morphine and related, and robaxin [methocarbamol].  MEDICATIONS:  Current Outpatient Medications  Medication Sig Dispense Refill  . amitriptyline (ELAVIL) 25 MG tablet Take $RemoveBef'25mg'obkatgKAke$  by mouth every night  5  . CALCIUM PO Take 2 tablets by mouth daily.    . Carboxymethylcellul-Glycerin (LUBRICATING EYE DROPS OP) Place 1 drop into both eyes 2 (two) times daily.     . Cyanocobalamin (B-12 COMPLIANCE INJECTION) 1000 MCG/ML KIT Inject 1,000 mcg every  30 (thirty) days as directed.    . eletriptan (RELPAX) 40 MG tablet Take 40 mg by mouth daily as needed for migraine. May repeat in 2 hours if necessary    . ELIQUIS 5 MG TABS tablet TAKE 1 TABLET BY MOUTH TWICE A DAY 60 tablet 8  . gabapentin (NEURONTIN) 600 MG tablet Take 600 mg by mouth 2 (two) times daily.    Marland Kitchen levothyroxine (SYNTHROID, LEVOTHROID) 100 MCG tablet Take 100 mcg daily before breakfast by mouth.    . Melatonin 3 MG TABS Take 9 mg by mouth at bedtime.     . montelukast (SINGULAIR) 10 MG tablet Take 10 mg by mouth daily.    . polyethylene glycol (MIRALAX / GLYCOLAX) packet Take 17 g every evening by mouth.    . propranolol (INDERAL) 60  MG tablet Take 60 mg by mouth daily.    Marland Kitchen spironolactone (ALDACTONE) 25 MG tablet TAKE 1/2 TABLET BY MOUTH TWICE A DAY 90 tablet 3  . VITAMIN D PO Take 2 capsules by mouth daily.     No current facility-administered medications for this visit.    REVIEW OF SYSTEMS:   Constitutional: Denies fevers, chills or abnormal night sweats All other systems were reviewed with the patient and are negative.  PHYSICAL EXAMINATION: ECOG PERFORMANCE STATUS: 1 - Symptomatic but completely ambulatory  Vitals:   01/02/20 1315  BP: (!) 126/49  Pulse: (!) 55  Resp: 18  Temp: 97.9 F (36.6 C)  SpO2: 100%   Filed Weights   01/02/20 1315  Weight: 161 lb 11.2 oz (73.3 kg)    GENERAL:alert, no distress and comfortable  LABORATORY DATA:  I have reviewed the data as listed Lab Results  Component Value Date   WBC 11.9 (H) 02/27/2017   HGB 13.5 02/27/2017   HCT 41.4 02/27/2017   MCV 95.2 02/27/2017   PLT 306 02/27/2017   Lab Results  Component Value Date   NA 140 02/27/2017   K 3.7 02/27/2017   CL 102 02/27/2017   CO2 26 02/27/2017    RADIOGRAPHIC STUDIES: I have personally reviewed the radiological reports and agreed with the findings in the report.  ASSESSMENT AND PLAN:  Ductal carcinoma in situ (DCIS) of left breast 12/12/2019:Screening  mammogram on 11/27/19 showed left breast calcifications. 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 She previously underwent a left breast biopsy of the 2:00 position in 2011.  Pathology review: I discussed with the patient the difference between DCIS and invasive breast cancer. It is considered a precancerous lesion. DCIS is classified as a 0. It is generally detected through mammograms as calcifications. We discussed the significance of grades and its impact on prognosis. We also discussed the importance of ER and PR receptors and their implications to adjuvant treatment options. Prognosis of DCIS dependence on grade, comedo necrosis. It is anticipated that if not treated, 20-30% of DCIS can develop into invasive breast cancer.  Recommendation: 1. Breast conserving surgery 2. Followed by adjuvant radiation therapy (not very excited about radiation therapy but I convinced her to have a conversation with radiation oncology) 3. 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  We will review the bone density test prior to starting her treatment.  Anastrozole counseling:We discussed the risks and benefits of anti-estrogen therapy with aromatase inhibitors. These include but not limited to insomnia, hot flashes, mood changes, vaginal dryness, bone density loss, and weight gain. We strongly believe that the benefits far outweigh the risks. Patient understands these risks and consented to starting treatment. Planned treatment duration is 7 years.  .  Surgery is expected to take place 01/22/2020.  I will see her back 01/29/2020  Return to clinic after surgery to discuss the final pathology report and come up with an adjuvant treatment plan.  All questions were  answered. The patient knows to call the clinic with any problems, questions or concerns.   Rulon Eisenmenger, MD, MPH 01/02/2020    I, Molly Dorshimer, am acting as scribe for Nicholas Lose, MD.  I have reviewed the above documentation for accuracy and completeness, and  I agree with the above.       

## 2020-01-02 ENCOUNTER — Other Ambulatory Visit: Payer: Self-pay

## 2020-01-02 ENCOUNTER — Inpatient Hospital Stay: Payer: Medicare PPO | Attending: Hematology and Oncology | Admitting: Hematology and Oncology

## 2020-01-02 DIAGNOSIS — Z170 Estrogen receptor positive status [ER+]: Secondary | ICD-10-CM

## 2020-01-02 DIAGNOSIS — Z8589 Personal history of malignant neoplasm of other organs and systems: Secondary | ICD-10-CM | POA: Diagnosis not present

## 2020-01-02 DIAGNOSIS — D0512 Intraductal carcinoma in situ of left breast: Secondary | ICD-10-CM | POA: Diagnosis not present

## 2020-01-02 DIAGNOSIS — Z7901 Long term (current) use of anticoagulants: Secondary | ICD-10-CM

## 2020-01-02 DIAGNOSIS — Z85828 Personal history of other malignant neoplasm of skin: Secondary | ICD-10-CM | POA: Diagnosis not present

## 2020-01-02 DIAGNOSIS — Z86718 Personal history of other venous thrombosis and embolism: Secondary | ICD-10-CM

## 2020-01-02 NOTE — Assessment & Plan Note (Signed)
12/12/2019:Screening mammogram on 11/27/19 showed left breast calcifications. 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 She previously underwent a left breast biopsy of the 2:00 position in 2011.  Pathology review: I discussed with the patient the difference between DCIS and invasive breast cancer. It is considered a precancerous lesion. DCIS is classified as a 0. It is generally detected through mammograms as calcifications. We discussed the significance of grades and its impact on prognosis. We also discussed the importance of ER and PR receptors and their implications to adjuvant treatment options. Prognosis of DCIS dependence on grade, comedo necrosis. It is anticipated that if not treated, 20-30% of DCIS can develop into invasive breast cancer.  Recommendation: 1. Breast conserving surgery 2. Followed by adjuvant radiation therapy 3. Followed by antiestrogen therapy with tamoxifen 5 years  Tamoxifen counseling: We discussed the risks and benefits of tamoxifen. These include but not limited to insomnia, hot flashes, mood changes, vaginal dryness, and weight gain. Although rare, serious side effects including endometrial cancer, risk of blood clots were also discussed. We strongly believe that the benefits far outweigh the risks. Patient understands these risks and consented to starting treatment. Planned treatment duration is 5 years.  Return to clinic after surgery to discuss the final pathology report and come up with an adjuvant treatment plan.

## 2020-01-06 ENCOUNTER — Encounter: Payer: Self-pay | Admitting: Licensed Clinical Social Worker

## 2020-01-06 ENCOUNTER — Telehealth: Payer: Self-pay | Admitting: Hematology and Oncology

## 2020-01-06 ENCOUNTER — Encounter: Payer: Self-pay | Admitting: *Deleted

## 2020-01-06 NOTE — Progress Notes (Signed)
[facility] Clinical Social Work  Initial Assessment   [name] is a 64 y.o. year old female contacted by phone. Clinical Social Work was referred by new patient protocol for assessment of psychosocial needs.   SDOH (Social Determinants of Health) assessments performed: Yes SDOH Interventions   Flowsheet Row Most Recent Value  SDOH Interventions   Food Insecurity Interventions Intervention Not Indicated  Financial Strain Interventions Intervention Not Indicated  Housing Interventions Intervention Not Indicated  Transportation Interventions Intervention Not Indicated      Distress Screen completed: No No flowsheet data found.    Family/Social Information:  . Housing Arrangement: patient lives with husband . Family members/support persons in your life? Family- husband, son, son's girlfriend . Transportation concerns: no  . Employment: Retired Pharmacist, hospital. Income source: Retirement and husband's employment . Financial concerns: No o Type of concern: None . Food access concerns: no . Religious or spiritual practice: yes, strong faith . Medication Concerns: no  . Services Currently in place:  n/a  Coping/ Adjustment to diagnosis: . Patient understands treatment plan and what happens next? Yes. Wondering what insurance will cover- recommended contacting insurance plan . Concerns about diagnosis and/or treatment: I'm not especially worried about anything . Patient reported stressors: none . Current coping skills/ strengths: Capable of independent living, Religious Affiliation and Supportive family/friends    SUMMARY: Current SDOH Barriers:  . No significant SDOH barriers noted today  Clinical Social Work Clinical Goal(s):  Marland Kitchen No social work goals currently  Interventions: . Discussed common feeling and emotions when being diagnosed with cancer, and the importance of support during treatment . Informed patient of the support team roles and support services at [facility] . Provided CSW  contact information and encouraged patient to call with any questions or concerns   Follow Up Plan: Patient will contact CSW with any support or resource needs Patient verbalizes understanding of plan: Yes    Christeen Douglas , LCSW

## 2020-01-06 NOTE — Telephone Encounter (Signed)
Scheduled follow-up appointment per 12/23 los. Patient is aware.

## 2020-01-13 NOTE — Progress Notes (Signed)
Location of Breast Cancer: Left Breast- Upper outer quadrant  Did patient present with symptoms (if so, please note symptoms) or was this found on screening mammography?: Routine mammogram.  Patient did not notice any changes with the breast.  Mammogram 11/27/2019: Left Breast calcifications.  Histology per Pathology Report: Left Breast 12/12/2019  Receptor Status: ER(+15%), PR (-0%), Her2-neu (), Ki-()    Past/Anticipated interventions by surgeon, if any: Dr. Georgette Dover 12/23/2019 -Left radioactive seed localized lumpectomy 01/22/2020  Past/Anticipated interventions by medical oncology, if any: Chemotherapy  Dr. Lindi Adie 01/02/2020 Recommendation: 1. Breast conserving surgery 2. Followed by adjuvant radiation therapy (not very excited about radiation therapy but I convinced her to have a conversation with radiation oncology) 3. 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  We will review the bone density test prior to starting her treatment. -Follow-up appointment 01/29/2020  Lymphedema issues, if any:  No  Pain issues, if any:  Has a lot of pain at the incision site.  SAFETY ISSUES:  Prior radiation? No  Pacemaker/ICD? Yes, Dr. Lovena Le inserted the pacer.  Dr. Harrell Gave is her Cardiologist.  Possible current pregnancy? Hysterectomy  Is the patient on methotrexate? No  Current Complaints / other details:      Cori Razor, RN 01/13/2020,12:09 PM

## 2020-01-14 ENCOUNTER — Ambulatory Visit
Admission: RE | Admit: 2020-01-14 | Discharge: 2020-01-14 | Disposition: A | Discharge status: Home / Self Care | Payer: Medicare PPO | Source: Ambulatory Visit | Attending: Radiation Oncology | Admitting: Radiation Oncology

## 2020-01-14 ENCOUNTER — Encounter: Payer: Self-pay | Admitting: Radiation Oncology

## 2020-01-14 ENCOUNTER — Other Ambulatory Visit: Payer: Self-pay

## 2020-01-14 VITALS — BP 122/48 | HR 56 | Temp 97.0°F | Resp 18 | Ht 67.0 in | Wt 163.2 lb

## 2020-01-14 DIAGNOSIS — Z7901 Long term (current) use of anticoagulants: Secondary | ICD-10-CM | POA: Diagnosis not present

## 2020-01-14 DIAGNOSIS — I4891 Unspecified atrial fibrillation: Secondary | ICD-10-CM | POA: Diagnosis not present

## 2020-01-14 DIAGNOSIS — M810 Age-related osteoporosis without current pathological fracture: Secondary | ICD-10-CM | POA: Insufficient documentation

## 2020-01-14 DIAGNOSIS — E039 Hypothyroidism, unspecified: Secondary | ICD-10-CM | POA: Insufficient documentation

## 2020-01-14 DIAGNOSIS — Z85828 Personal history of other malignant neoplasm of skin: Secondary | ICD-10-CM | POA: Diagnosis not present

## 2020-01-14 DIAGNOSIS — M129 Arthropathy, unspecified: Secondary | ICD-10-CM | POA: Diagnosis not present

## 2020-01-14 DIAGNOSIS — D0512 Intraductal carcinoma in situ of left breast: Secondary | ICD-10-CM | POA: Diagnosis not present

## 2020-01-14 DIAGNOSIS — G250 Essential tremor: Secondary | ICD-10-CM | POA: Diagnosis not present

## 2020-01-14 DIAGNOSIS — I5022 Chronic systolic (congestive) heart failure: Secondary | ICD-10-CM | POA: Diagnosis not present

## 2020-01-14 DIAGNOSIS — I110 Hypertensive heart disease with heart failure: Secondary | ICD-10-CM | POA: Insufficient documentation

## 2020-01-14 DIAGNOSIS — Z170 Estrogen receptor positive status [ER+]: Secondary | ICD-10-CM | POA: Diagnosis not present

## 2020-01-14 DIAGNOSIS — Z79899 Other long term (current) drug therapy: Secondary | ICD-10-CM | POA: Diagnosis not present

## 2020-01-14 DIAGNOSIS — I7300 Raynaud's syndrome without gangrene: Secondary | ICD-10-CM | POA: Diagnosis not present

## 2020-01-14 DIAGNOSIS — K219 Gastro-esophageal reflux disease without esophagitis: Secondary | ICD-10-CM | POA: Insufficient documentation

## 2020-01-15 ENCOUNTER — Encounter (HOSPITAL_BASED_OUTPATIENT_CLINIC_OR_DEPARTMENT_OTHER): Payer: Self-pay | Admitting: Surgery

## 2020-01-15 ENCOUNTER — Other Ambulatory Visit: Payer: Self-pay

## 2020-01-15 ENCOUNTER — Encounter: Payer: Self-pay | Admitting: Internal Medicine

## 2020-01-15 NOTE — Progress Notes (Signed)
PERIOPERATIVE PRESCRIPTION FOR IMPLANTED CARDIAC DEVICE PROGRAMMING   Patient Information: Name: [name], [name]  DOB: 1955-02-17   MRN: [id number]   Planned Procedure: Left breast lumpectomy with seed placement  Surgeon: Dr. Corliss Skains  Date of Procedure: 01/22/2020  Cautery will be used.  Position during surgery: supine   Please send documentation back to:  [facility] (Fax # [phone])   Ilean China, RN  01/15/2020 12:45 PM      Device Information:   Clinic EP Physician:   Lewayne Bunting, MD Device Type:  Pacemaker Manufacturer and Phone #:  [employer]tronic: [id number] Pacemaker Dependent?:  No Date of Last Device Check:       Normal Device Function?:  Yes  - Known high RV reading.   Electrophysiologist's Recommendations:    Have magnet available.  Provide continuous ECG monitoring when magnet is used or reprogramming is to be performed.   Procedure may interfere with device function.  Magnet should be placed over device during procedure.  Per Device Clinic Standing Orders, Uvaldo Rising  01/15/2020 4:19 PM

## 2020-01-15 NOTE — Progress Notes (Signed)
Radiation Oncology         [phone] ________________________________  Name: [name]        MRN: [id number]  Date of Service: 01/14/2020 DOB: 04-07-1955  VE:[place], [place]  Tsuei, Matthew, MD     REFERRING PHYSICIAN: Donnie Mesa, MD   DIAGNOSIS: The encounter diagnosis was Ductal carcinoma in situ (DCIS) of left breast.   HISTORY OF PRESENT ILLNESS: [name] is a 65 y.o. female seen for a new diagnosis of left breast cancer. The patient was noted to have screening mammogram which revealed calcifications in a linear pattern.  She underwent biopsy on 12/12/2019 which revealed high-grade DCIS with associated necrosis focal microinvasion could not be ruled out.  Her cancer was ER positive PR negative.  She is scheduled to undergo lumpectomy on 01/22/2019 2 with Dr. Georgette Dover, and has met with Dr. Lindi Adie.  She is seen today to discuss the options of adjuvant radiation as a part of her treatment plan as well.    PREVIOUS RADIATION THERAPY: No   PAST MEDICAL HISTORY:  Past Medical History:  Diagnosis Date  . AF (paroxysmal atrial fibrillation) ([place])   . Arthritis   . Basal cell carcinoma 11/11/1998   left forehead-tx dr Tonia Brooms  . BCC (basal cell carcinoma) 03/11/2003   left distal lower leg ankle-tx dr Tonia Brooms  . BCC (basal cell carcinoma) 11/19/2009   left anterior lat distal lower leg  . Biventricular cardiac pacemaker in situ    a. prior CRT-D in 2013 with downgrade to CRT-Pacemaker only in 11/2016.  . Breast cancer ([place]) 12/12/2019  . Chronic systolic CHF (congestive heart failure) ([place])   . Complication of anesthesia    aspiration  . Essential tremor   . Fibroids   . GERD (gastroesophageal reflux disease)    otc  . Hypertension   . Hyperthyroidism   . Hypothyroidism   . LBBB (left bundle branch block)    conduction disorder  . Migraines   . Mild aortic insufficiency   . NICM (nonischemic cardiomyopathy) ([place])    a. 2012: normal  coronaries, EF 25-30%. b. improved to 55% 11/2016.  . Osteoporosis   . Pleural effusion, right    thoracentesis 02/09/09   . Raynaud's disease   . SCC (squamous cell carcinoma) 07/27/2009   left preauricular-dr gruber  . SCC (squamous cell carcinoma) 08/20/2018   right upper lip-mohs Dr.mitkov  . Scoliosis   . Skin cancer    "squamous cell on nose, forehead, & left leg"  . Squamous cell carcinoma of skin 07/14/2004   right dorsal lateral upper nose-mohs       PAST SURGICAL HISTORY: Past Surgical History:  Procedure Laterality Date  . ARTERY BIOPSY  12/29/2011   Procedure: MINOR BIOPSY TEMPORAL ARTERY;  Surgeon: Edward Jolly, MD;  Location: [place];  Service: General;  Laterality: Right;  Right temporal artery biopsy  . BACK SURGERY  12/31/2009   "for flat back syndrome; fused bottom of my back"  . BI-VENTRICULAR IMPLANTABLE CARDIOVERTER DEFIBRILLATOR Left 01/20/2011   Procedure: BI-VENTRICULAR IMPLANTABLE CARDIOVERTER DEFIBRILLATOR  (CRT-D);  Surgeon: Evans Lance, MD;  Location[facility] C[facility] LAB;  Service: Cardiovascular;  Laterality: Left;  . BIV ICD GENERATOR CHANGEOUT N/A 11/24/2016   Procedure: BIV ICD GENERATOR CHANGEOUT;  Surgeon: Evans Lance, MD;  Location: [facility] [place] CV LAB;  Service: Cardiovascular;  Laterality: N/A;  . CHOLECYSTECTOMY  ~1996  . FOOT NEUROMA SURGERY  ~ 2003  right  . ROTATOR CUFF REPAIR  ~ 2009   left  . SHOULDER ARTHROSCOPY W/ ROTATOR CUFF REPAIR  09/21/2011  . SKIN CANCER EXCISION     nose, forehead, left leg  . TEE WITHOUT CARDIOVERSION N/A 03/01/2017   Procedure: TRANSESOPHAGEAL ECHOCARDIOGRAM (TEE);  Surgeon: Skeet Latch, MD;  Location: [place];  Service: Cardiovascular;  Laterality: N/A;  . THYROID LOBECTOMY  ~ 2006   right  . TUBAL LIGATION  ~ 1983  . VAGINAL HYSTERECTOMY  ~ 2009  . VESICOVAGINAL FISTULA CLOSURE W/ TAH       FAMILY HISTORY:  Family History  Problem Relation Age of Onset  . Heart  disease Mother   . Heart failure Mother 60       CHF  . Stroke Mother   . Hypertension Mother   . Heart disease Father 74  . Macular degeneration Father   . Heart disease Sister   . Atrial fibrillation Sister   . Heart disease Sister      SOCIAL HISTORY:  reports that she has never smoked. She has never used smokeless tobacco. She reports that she does not drink alcohol and does not use drugs.   ALLERGIES: Other, Tape, Codeine, Fentanyl, Morphine and related, and Robaxin [methocarbamol]   MEDICATIONS:  Current Outpatient Medications  Medication Sig Dispense Refill  . amitriptyline (ELAVIL) 25 MG tablet Take 78m by mouth every night  5  . CALCIUM PO Take 2 tablets by mouth daily.    . Carboxymethylcellul-Glycerin (LUBRICATING EYE DROPS OP) Place 1 drop into both eyes 2 (two) times daily.     . Cyanocobalamin 1000 MCG/ML KIT Inject 1,000 mcg every 30 (thirty) days as directed.    . eletriptan (RELPAX) 40 MG tablet Take 40 mg by mouth daily as needed for migraine. May repeat in 2 hours if necessary    . ELIQUIS 5 MG TABS tablet TAKE 1 TABLET BY MOUTH TWICE A DAY 60 tablet 8  . gabapentin (NEURONTIN) 600 MG tablet Take 600 mg by mouth 2 (two) times daily.    .Marland Kitchenlevothyroxine (SYNTHROID, LEVOTHROID) 100 MCG tablet Take 100 mcg daily before breakfast by mouth.    . Melatonin 3 MG TABS Take 9 mg by mouth at bedtime.     . montelukast (SINGULAIR) 10 MG tablet Take 10 mg by mouth daily.    . Omega-3 Fatty Acids (FISH OIL) 1000 MG CAPS 1 capsule    . polyethylene glycol (MIRALAX / GLYCOLAX) packet Take 17 g every evening by mouth.    . propranolol (INDERAL) 60 MG tablet Take 60 mg by mouth daily.    .Marland Kitchenspironolactone (ALDACTONE) 25 MG tablet TAKE 1/2 TABLET BY MOUTH TWICE A DAY 90 tablet 3  . VITAMIN D PO Take 2 capsules by mouth daily.     No current facility-administered medications for this encounter.     REVIEW OF SYSTEMS: On review of systems, the patient reports that she is doing  well overall.  She denies any specific complaints of the breast.  She is interested in moving forward quickly, and is looking forward to having this treatment behind her.    PHYSICAL EXAM:  Wt Readings from Last 3 Encounters:  01/14/20 163 lb 4 oz (74 kg)  01/02/20 161 lb 11.2 oz (73.3 kg)  11/05/19 162 lb (73.5 kg)   Temp Readings from Last 3 Encounters:  01/14/20 (!) 97 F (36.1 C) (Temporal)  01/02/20 97.9 F (36.6 C) (Tympanic)  11/05/19 97.9 F (36.6 C) (  Oral)   BP Readings from Last 3 Encounters:  01/14/20 (!) 122/48  01/02/20 (!) 126/49  11/05/19 100/64   Pulse Readings from Last 3 Encounters:  01/14/20 (!) 56  01/02/20 (!) 55  11/05/19 60    In general this is a well appearing Caucasian female in no acute distress. She's alert and oriented x4 and appropriate throughout the examination. Cardiopulmonary assessment is negative for acute distress and she exhibits normal effort. Bilateral breast exam is deferred.    ECOG = 0  0 - Asymptomatic (Fully active, able to carry on all predisease activities without restriction)  1 - Symptomatic but completely ambulatory (Restricted in physically strenuous activity but ambulatory and able to carry out work of a light or sedentary nature. For example, light housework, office work)  2 - Symptomatic, <50% in bed during the day (Ambulatory and capable of all self care but unable to carry out any work activities. Up and about more than 50% of waking hours)  3 - Symptomatic, >50% in bed, but not bedbound (Capable of only limited self-care, confined to bed or chair 50% or more of waking hours)  4 - Bedbound (Completely disabled. Cannot carry on any self-care. Totally confined to bed or chair)  5 - Death   Eustace Pen MM, Creech RH, Tormey DC, et al. [phone]). "Toxicity and response criteria of the [facility] Group". [place] Oncol. 5 (6): 649-55    LABORATORY DATA:  Lab Results  Component Value Date   WBC 11.9 (H)  02/27/2017   HGB 13.5 02/27/2017   HCT 41.4 02/27/2017   MCV 95.2 02/27/2017   PLT 306 02/27/2017   Lab Results  Component Value Date   NA 140 02/27/2017   K 3.7 02/27/2017   CL 102 02/27/2017   CO2 26 02/27/2017   Lab Results  Component Value Date   ALT 30 02/27/2017   AST 30 02/27/2017   ALKPHOS 71 02/27/2017   BILITOT 0.5 02/27/2017      RADIOGRAPHY: No results found.     IMPRESSION/PLAN: 1. High-grade ER positive DCIS of the left breast.Dr. Lisbeth Renshaw discusses the pathology findings and reviews the nature of early stage left breast disease. The consensus from the breast conference includes breast conservation with lumpectomy.  Dr. Lisbeth Renshaw discusses the rationale for external radiotherapy to the breast  to reduce risks of local recurrence followed by antiestrogen therapy. We discussed the risks, benefits, short, and long term effects of radiotherapy, as well as the curative intent, and the patient is interested in proceeding.  After looking at her films again however Dr. Lisbeth Renshaw has reviewed that the ICD appears to be in the field of where radiation will be given and would either need to be relocated to the right chest in order to administer radiation, or we could further discuss options of trying to obtain wider margins and forego radiotherapy.  The patient is however interested in proceeding and is interested in being very aggressive with her treatment plan and would prefer to have her ICD removed and to proceed with radiotherapy Dr. Lisbeth Renshaw discusses the delivery and logistics of radiotherapy and anticipates a course of 4 weeks of radiotherapy. We will see her back a few weeks after surgery to discuss the simulation process and anticipate we starting radiotherapy about 4-6 weeks after surgery.  2. In situ ICD.  We will reach out to Dr. Lovena Le and Dr. Harrell Gave about the possibility of relocating her ICD as it is quite low in the left  chest and would overlap with her radiotherapy fields.  Dr.  Lisbeth Renshaw discussed however that if significantly wide margins were obtained with surgery, radiotherapy may be less prioritized if her ICD cannot be removed.  The patient is motivated however to be able to proceed with radiation and if cardiology is in agreement would like this moved to the right side.  In a visit lasting 60 minutes, greater than 50% of the time was spent face to face reviewing her case, as well as in preparation of, discussing, and coordinating the patient's care.  The above documentation reflects my direct findings during this shared patient visit. Please see the separate note by Dr. Lisbeth Renshaw on this date for the remainder of the patient's plan of care.    Carola Rhine, PAC

## 2020-01-17 ENCOUNTER — Telehealth: Payer: Self-pay

## 2020-01-17 DIAGNOSIS — M542 Cervicalgia: Secondary | ICD-10-CM | POA: Diagnosis not present

## 2020-01-17 DIAGNOSIS — M546 Pain in thoracic spine: Secondary | ICD-10-CM | POA: Diagnosis not present

## 2020-01-17 DIAGNOSIS — G8929 Other chronic pain: Secondary | ICD-10-CM | POA: Diagnosis not present

## 2020-01-17 DIAGNOSIS — M5030 Other cervical disc degeneration, unspecified cervical region: Secondary | ICD-10-CM | POA: Diagnosis not present

## 2020-01-17 NOTE — Telephone Encounter (Signed)
Documentation received requesting clearance for Radiation related to implanted cardiac device.   Pt will be having left breast radiation.  Device is currently located on left chest.  Spoke with Dr. Lovena Le, he requests that patient be brought in to clinic for assessment and determination of whether device will need to be moved.    Left message for patient to callback.

## 2020-01-18 ENCOUNTER — Inpatient Hospital Stay (HOSPITAL_COMMUNITY): Admission: RE | Admit: 2020-01-18 | Payer: Medicare PPO | Source: Ambulatory Visit

## 2020-01-20 ENCOUNTER — Encounter (HOSPITAL_BASED_OUTPATIENT_CLINIC_OR_DEPARTMENT_OTHER)
Admission: RE | Admit: 2020-01-20 | Discharge: 2020-01-20 | Disposition: A | Discharge status: Home / Self Care | Payer: Medicare PPO | Source: Ambulatory Visit | Attending: Surgery | Admitting: Surgery

## 2020-01-20 ENCOUNTER — Other Ambulatory Visit (HOSPITAL_COMMUNITY)
Admission: RE | Admit: 2020-01-20 | Discharge: 2020-01-20 | Disposition: A | Discharge status: Home / Self Care | Payer: Medicare PPO | Source: Ambulatory Visit | Attending: Surgery | Admitting: Surgery

## 2020-01-20 DIAGNOSIS — Z01812 Encounter for preprocedural laboratory examination: Secondary | ICD-10-CM | POA: Insufficient documentation

## 2020-01-20 DIAGNOSIS — Z20822 Contact with and (suspected) exposure to covid-19: Secondary | ICD-10-CM | POA: Insufficient documentation

## 2020-01-20 LAB — BASIC METABOLIC PANEL
Anion gap: 10 (ref 5–15)
BUN: 21 mg/dL (ref 8–23)
CO2: 27 mmol/L (ref 22–32)
Calcium: 9.5 mg/dL (ref 8.9–10.3)
Chloride: 100 mmol/L (ref 98–111)
Creatinine, Ser: 0.98 mg/dL (ref 0.44–1.00)
GFR, Estimated: >60 mL/min (ref >60)
Glucose, Bld: 98 mg/dL (ref 70–99)
Potassium: 4.3 mmol/L (ref 3.5–5.1)
Sodium: 137 mmol/L (ref 135–145)

## 2020-01-20 LAB — SARS CORONAVIRUS 2 (TAT 6-24 HRS): SARS Coronavirus 2: NEGATIVE

## 2020-01-20 NOTE — Progress Notes (Signed)

## 2020-01-20 NOTE — Progress Notes (Signed)
Anesthesia consult per Dr. Sabra Heck, due to patient stated history of aspiration no ERAS day of surgery per Dr. Sabra Heck. Instructed pt to be NPO after midnight. Pt verbalized understanding.

## 2020-01-21 DIAGNOSIS — R921 Mammographic calcification found on diagnostic imaging of breast: Secondary | ICD-10-CM | POA: Diagnosis not present

## 2020-01-21 NOTE — Telephone Encounter (Signed)
Scheduled for f/u with Dr Lovena Le 01/29/20 in [place].Left message on cell phone per DPR with appointment date, time, and [place] location. Device Clinic # provided for any questions concerning appointment.

## 2020-01-22 ENCOUNTER — Encounter (HOSPITAL_BASED_OUTPATIENT_CLINIC_OR_DEPARTMENT_OTHER): Payer: Self-pay | Admitting: Surgery

## 2020-01-22 ENCOUNTER — Other Ambulatory Visit: Payer: Self-pay

## 2020-01-22 ENCOUNTER — Ambulatory Visit (HOSPITAL_BASED_OUTPATIENT_CLINIC_OR_DEPARTMENT_OTHER): Payer: Medicare PPO | Admitting: Anesthesiology

## 2020-01-22 ENCOUNTER — Ambulatory Visit (HOSPITAL_BASED_OUTPATIENT_CLINIC_OR_DEPARTMENT_OTHER)
Admission: RE | Admit: 2020-01-22 | Discharge: 2020-01-22 | Disposition: A | Discharge status: Home / Self Care | Payer: Medicare PPO | Attending: Surgery | Admitting: Surgery

## 2020-01-22 ENCOUNTER — Encounter (HOSPITAL_BASED_OUTPATIENT_CLINIC_OR_DEPARTMENT_OTHER)
Admission: RE | Disposition: A | Discharge status: Home / Self Care | Payer: Self-pay | Source: Home / Self Care | Attending: Surgery

## 2020-01-22 DIAGNOSIS — Z833 Family history of diabetes mellitus: Secondary | ICD-10-CM | POA: Insufficient documentation

## 2020-01-22 DIAGNOSIS — N6489 Other specified disorders of breast: Secondary | ICD-10-CM | POA: Diagnosis not present

## 2020-01-22 DIAGNOSIS — Z7901 Long term (current) use of anticoagulants: Secondary | ICD-10-CM | POA: Insufficient documentation

## 2020-01-22 DIAGNOSIS — Z79899 Other long term (current) drug therapy: Secondary | ICD-10-CM | POA: Insufficient documentation

## 2020-01-22 DIAGNOSIS — Z8249 Family history of ischemic heart disease and other diseases of the circulatory system: Secondary | ICD-10-CM | POA: Diagnosis not present

## 2020-01-22 DIAGNOSIS — Z8042 Family history of malignant neoplasm of prostate: Secondary | ICD-10-CM | POA: Diagnosis not present

## 2020-01-22 DIAGNOSIS — Z885 Allergy status to narcotic agent status: Secondary | ICD-10-CM | POA: Diagnosis not present

## 2020-01-22 DIAGNOSIS — D0512 Intraductal carcinoma in situ of left breast: Secondary | ICD-10-CM | POA: Diagnosis not present

## 2020-01-22 DIAGNOSIS — I4891 Unspecified atrial fibrillation: Secondary | ICD-10-CM | POA: Diagnosis not present

## 2020-01-22 DIAGNOSIS — Z8349 Family history of other endocrine, nutritional and metabolic diseases: Secondary | ICD-10-CM | POA: Insufficient documentation

## 2020-01-22 DIAGNOSIS — Z8261 Family history of arthritis: Secondary | ICD-10-CM | POA: Insufficient documentation

## 2020-01-22 DIAGNOSIS — I480 Paroxysmal atrial fibrillation: Secondary | ICD-10-CM | POA: Diagnosis not present

## 2020-01-22 DIAGNOSIS — Z820 Family history of epilepsy and other diseases of the nervous system: Secondary | ICD-10-CM | POA: Insufficient documentation

## 2020-01-22 DIAGNOSIS — I447 Left bundle-branch block, unspecified: Secondary | ICD-10-CM | POA: Diagnosis not present

## 2020-01-22 DIAGNOSIS — C50912 Malignant neoplasm of unspecified site of left female breast: Secondary | ICD-10-CM | POA: Diagnosis not present

## 2020-01-22 DIAGNOSIS — Z170 Estrogen receptor positive status [ER+]: Secondary | ICD-10-CM | POA: Insufficient documentation

## 2020-01-22 DIAGNOSIS — N6022 Fibroadenosis of left breast: Secondary | ICD-10-CM | POA: Diagnosis not present

## 2020-01-22 DIAGNOSIS — E039 Hypothyroidism, unspecified: Secondary | ICD-10-CM | POA: Diagnosis not present

## 2020-01-22 DIAGNOSIS — Z888 Allergy status to other drugs, medicaments and biological substances status: Secondary | ICD-10-CM | POA: Insufficient documentation

## 2020-01-22 HISTORY — DX: Raynaud's syndrome without gangrene: I73.00

## 2020-01-22 HISTORY — PX: BREAST LUMPECTOMY WITH RADIOACTIVE SEED LOCALIZATION: SHX6424

## 2020-01-22 SURGERY — BREAST LUMPECTOMY WITH RADIOACTIVE SEED LOCALIZATION
Anesthesia: General | Site: Breast | Laterality: Left

## 2020-01-22 MED ORDER — CHLORHEXIDINE GLUCONATE CLOTH 2 % EX PADS: 6.0000 | MEDICATED_PAD | Freq: Once | CUTANEOUS | Status: DC

## 2020-01-22 MED ORDER — ACETAMINOPHEN 500 MG PO TABS
ORAL_TABLET | ORAL | Status: AC
  Filled 2020-01-22: qty 2

## 2020-01-22 MED ORDER — ONDANSETRON HCL 4 MG/2ML IJ SOLN
INTRAMUSCULAR | Status: DC | PRN
  Administered 2020-01-22: 4 mg via INTRAVENOUS

## 2020-01-22 MED ORDER — BUPIVACAINE-EPINEPHRINE 0.25% -1:200000 IJ SOLN
INTRAMUSCULAR | Status: DC | PRN
  Administered 2020-01-22: 15 mL

## 2020-01-22 MED ORDER — ONDANSETRON HCL 4 MG/2ML IJ SOLN
INTRAMUSCULAR | Status: AC
  Filled 2020-01-22: qty 2

## 2020-01-22 MED ORDER — PHENYLEPHRINE 40 MCG/ML (10ML) SYRINGE FOR IV PUSH (FOR BLOOD PRESSURE SUPPORT)
PREFILLED_SYRINGE | INTRAVENOUS | Status: AC
  Filled 2020-01-22: qty 10

## 2020-01-22 MED ORDER — PHENYLEPHRINE HCL (PRESSORS) 10 MG/ML IV SOLN
INTRAVENOUS | Status: DC | PRN
  Administered 2020-01-22: 80 ug via INTRAVENOUS
  Administered 2020-01-22: 120 ug via INTRAVENOUS
  Administered 2020-01-22: 40 ug via INTRAVENOUS

## 2020-01-22 MED ORDER — MEPERIDINE HCL 25 MG/ML IJ SOLN: 6.2500 mg | INTRAMUSCULAR | Status: DC | PRN

## 2020-01-22 MED ORDER — LIDOCAINE 2% (20 MG/ML) 5 ML SYRINGE
INTRAMUSCULAR | Status: AC
  Filled 2020-01-22: qty 5

## 2020-01-22 MED ORDER — OXYCODONE HCL 5 MG/5ML PO SOLN: 5.0000 mg | Freq: Once | ORAL | Status: DC | PRN

## 2020-01-22 MED ORDER — ACETAMINOPHEN 325 MG PO TABS: 325.0000 mg | ORAL_TABLET | ORAL | Status: DC | PRN

## 2020-01-22 MED ORDER — ACETAMINOPHEN 500 MG PO TABS
1000.0000 mg | ORAL_TABLET | ORAL | Status: AC
  Administered 2020-01-22: 1000 mg via ORAL

## 2020-01-22 MED ORDER — DEXAMETHASONE SODIUM PHOSPHATE 10 MG/ML IJ SOLN
INTRAMUSCULAR | Status: DC | PRN
  Administered 2020-01-22: 5 mg via INTRAVENOUS

## 2020-01-22 MED ORDER — PROPOFOL 10 MG/ML IV BOLUS
INTRAVENOUS | Status: AC
  Filled 2020-01-22: qty 20

## 2020-01-22 MED ORDER — MIDAZOLAM HCL 5 MG/5ML IJ SOLN
INTRAMUSCULAR | Status: DC | PRN
  Administered 2020-01-22: 2 mg via INTRAVENOUS

## 2020-01-22 MED ORDER — PROPOFOL 500 MG/50ML IV EMUL
INTRAVENOUS | Status: DC | PRN
  Administered 2020-01-22: 150 ug/kg/min via INTRAVENOUS

## 2020-01-22 MED ORDER — PROPOFOL 10 MG/ML IV BOLUS
INTRAVENOUS | Status: DC | PRN
  Administered 2020-01-22: 140 mg via INTRAVENOUS

## 2020-01-22 MED ORDER — LIDOCAINE HCL (CARDIAC) PF 100 MG/5ML IV SOSY
PREFILLED_SYRINGE | INTRAVENOUS | Status: DC | PRN
  Administered 2020-01-22: 60 mg via INTRAVENOUS

## 2020-01-22 MED ORDER — LACTATED RINGERS IV SOLN: INTRAVENOUS | Status: DC

## 2020-01-22 MED ORDER — ONDANSETRON HCL 4 MG/2ML IJ SOLN: 4.0000 mg | Freq: Once | INTRAMUSCULAR | Status: DC | PRN

## 2020-01-22 MED ORDER — CEFAZOLIN SODIUM-DEXTROSE 2-4 GM/100ML-% IV SOLN
2.0000 g | INTRAVENOUS | Status: AC
  Administered 2020-01-22: 2 g via INTRAVENOUS

## 2020-01-22 MED ORDER — OXYCODONE HCL 5 MG PO TABS: 5.0000 mg | ORAL_TABLET | Freq: Once | ORAL | Status: DC | PRN

## 2020-01-22 MED ORDER — FENTANYL CITRATE (PF) 100 MCG/2ML IJ SOLN: 25.0000 ug | INTRAMUSCULAR | Status: DC | PRN

## 2020-01-22 MED ORDER — DEXAMETHASONE SODIUM PHOSPHATE 10 MG/ML IJ SOLN
INTRAMUSCULAR | Status: AC
  Filled 2020-01-22: qty 1

## 2020-01-22 MED ORDER — MIDAZOLAM HCL 2 MG/2ML IJ SOLN
INTRAMUSCULAR | Status: AC
  Filled 2020-01-22: qty 2

## 2020-01-22 MED ORDER — ACETAMINOPHEN 160 MG/5ML PO SOLN: 325.0000 mg | ORAL | Status: DC | PRN

## 2020-01-22 MED ORDER — CEFAZOLIN SODIUM-DEXTROSE 2-4 GM/100ML-% IV SOLN
INTRAVENOUS | Status: AC
  Filled 2020-01-22: qty 100

## 2020-01-22 SURGICAL SUPPLY — 49 items
APL PRP STRL LF DISP 70% ISPRP (MISCELLANEOUS) ×1
APL SKNCLS STERI-STRIP NONHPOA (GAUZE/BANDAGES/DRESSINGS) ×1
APPLIER CLIP 9.375 MED OPEN (MISCELLANEOUS) ×2
APR CLP MED 9.3 20 MLT OPN (MISCELLANEOUS) ×1
BENZOIN TINCTURE PRP APPL 2/3 (GAUZE/BANDAGES/DRESSINGS) ×2 IMPLANT
BLADE HEX COATED 2.75 (ELECTRODE) ×2 IMPLANT
BLADE SURG 15 STRL LF DISP TIS (BLADE) ×1 IMPLANT
BLADE SURG 15 STRL SS (BLADE) ×2
CANISTER SUC SOCK COL 7IN (MISCELLANEOUS) IMPLANT
CANISTER SUCT 1200ML W/VALVE (MISCELLANEOUS) IMPLANT
CHLORAPREP W/TINT 26 (MISCELLANEOUS) ×2 IMPLANT
CLIP APPLIE 9.375 MED OPEN (MISCELLANEOUS) ×1 IMPLANT
COVER BACK TABLE 60X90IN (DRAPES) ×2 IMPLANT
COVER MAYO STAND STRL (DRAPES) ×2 IMPLANT
COVER PROBE W GEL 5X96 (DRAPES) ×2 IMPLANT
COVER WAND RF STERILE (DRAPES) IMPLANT
DECANTER SPIKE VIAL GLASS SM (MISCELLANEOUS) IMPLANT
DRAPE LAPAROTOMY 100X72 PEDS (DRAPES) ×2 IMPLANT
DRAPE UTILITY XL STRL (DRAPES) ×2 IMPLANT
DRSG TEGADERM 4X4.75 (GAUZE/BANDAGES/DRESSINGS) ×2 IMPLANT
ELECT REM PT RETURN 9FT ADLT (ELECTROSURGICAL) ×2
ELECTRODE REM PT RTRN 9FT ADLT (ELECTROSURGICAL) ×1 IMPLANT
GAUZE SPONGE 4X4 12PLY STRL LF (GAUZE/BANDAGES/DRESSINGS) IMPLANT
GLOVE SURG ENC MOIS LTX SZ7 (GLOVE) ×2 IMPLANT
GLOVE SURG UNDER POLY LF SZ7.5 (GLOVE) ×2 IMPLANT
GOWN STRL REUS W/ TWL LRG LVL3 (GOWN DISPOSABLE) ×3 IMPLANT
GOWN STRL REUS W/TWL LRG LVL3 (GOWN DISPOSABLE) ×6
ILLUMINATOR WAVEGUIDE N/F (MISCELLANEOUS) IMPLANT
KIT MARKER MARGIN INK (KITS) ×2 IMPLANT
LIGHT WAVEGUIDE WIDE FLAT (MISCELLANEOUS) IMPLANT
NEEDLE HYPO 25X1 1.5 SAFETY (NEEDLE) ×2 IMPLANT
NS IRRIG 1000ML POUR BTL (IV SOLUTION) ×2 IMPLANT
PACK BASIN DAY SURGERY FS (CUSTOM PROCEDURE TRAY) ×2 IMPLANT
PENCIL SMOKE EVACUATOR (MISCELLANEOUS) ×2 IMPLANT
SLEEVE SCD COMPRESS KNEE MED (MISCELLANEOUS) ×2 IMPLANT
SPONGE GAUZE 2X2 8PLY STRL LF (GAUZE/BANDAGES/DRESSINGS) IMPLANT
SPONGE LAP 18X18 RF (DISPOSABLE) IMPLANT
SPONGE LAP 4X18 RFD (DISPOSABLE) ×2 IMPLANT
STRIP CLOSURE SKIN 1/2X4 (GAUZE/BANDAGES/DRESSINGS) ×2 IMPLANT
SUT MON AB 4-0 PC3 18 (SUTURE) ×2 IMPLANT
SUT SILK 2 0 SH (SUTURE) IMPLANT
SUT VIC AB 3-0 SH 27 (SUTURE) ×2
SUT VIC AB 3-0 SH 27X BRD (SUTURE) ×1 IMPLANT
SYR BULB EAR ULCER 3OZ GRN STR (SYRINGE) IMPLANT
SYR CONTROL 10ML LL (SYRINGE) ×2 IMPLANT
TOWEL GREEN STERILE FF (TOWEL DISPOSABLE) ×2 IMPLANT
TRAY FAXITRON CT DISP (TRAY / TRAY PROCEDURE) ×2 IMPLANT
TUBE CONNECTING 20X1/4 (TUBING) IMPLANT
YANKAUER SUCT BULB TIP NO VENT (SUCTIONS) IMPLANT

## 2020-01-22 NOTE — Discharge Instructions (Signed)
[place] Office Phone Number [phone]  BREAST BIOPSY/ PARTIAL MASTECTOMY: POST OP INSTRUCTIONS  Always review your discharge instruction sheet given to you by the facility where your surgery was performed.  IF YOU HAVE DISABILITY OR FAMILY LEAVE FORMS, YOU MUST BRING THEM TO THE OFFICE FOR PROCESSING.  DO NOT GIVE THEM TO YOUR DOCTOR.  1. A prescription for pain medication may be given to you upon discharge.  Take your pain medication as prescribed, if needed.  If narcotic pain medicine is not needed, then you may take acetaminophen (Tylenol) or ibuprofen (Advil) as needed. 2. Take your usually prescribed medications unless otherwise directed 3. If you need a refill on your pain medication, please contact your pharmacy.  They will contact our office to request authorization.  Prescriptions will not be filled after 5pm or on week-ends. 4. You should eat very light the first 24 hours after surgery, such as soup, crackers, pudding, etc.  Resume your normal diet the day after surgery. 5. Most patients will experience some swelling and bruising in the breast.  Ice packs and a good support bra will help.  Swelling and bruising can take several days to resolve.  6. It is common to experience some constipation if taking pain medication after surgery.  Increasing fluid intake and taking a stool softener will usually help or prevent this problem from occurring.  A mild laxative (Milk of Magnesia or Miralax) should be taken according to package directions if there are no bowel movements after 48 hours. 7. Unless discharge instructions indicate otherwise, you may remove your bandages 24-48 hours after surgery, and you may shower at that time.  You may have steri-strips (small skin tapes) in place directly over the incision.  These strips should be left on the skin for 7-10 days.  If your surgeon used skin glue on the incision, you may shower in 24 hours.  The glue will flake off over the  next 2-3 weeks.  Any sutures or staples will be removed at the office during your follow-up visit. 8. ACTIVITIES:  You may resume regular daily activities (gradually increasing) beginning the next day.  Wearing a good support bra or sports bra minimizes pain and swelling.  You may have sexual intercourse when it is comfortable. a. You may drive when you no longer are taking prescription pain medication, you can comfortably wear a seatbelt, and you can safely maneuver your car and apply brakes. b. RETURN TO WORK:  ______________________________________________________________________________________ 9. You should see your doctor in the office for a follow-up appointment approximately two weeks after your surgery.  Your doctor's nurse will typically make your follow-up appointment when she calls you with your pathology report.  Expect your pathology report 2-3 business days after your surgery.  You may call to check if you do not hear from [place] after three days. 10. OTHER INSTRUCTIONS: _______________________________________________________________________________________________ _____________________________________________________________________________________________________________________________________ _____________________________________________________________________________________________________________________________________ _____________________________________________________________________________________________________________________________________  WHEN TO CALL YOUR DOCTOR: 1. Fever over 101.0 2. Nausea and/or vomiting. 3. Extreme swelling or bruising. 4. Continued bleeding from incision. 5. Increased pain, redness, or drainage from the incision.  The clinic staff is available to answer your questions during regular business hours.  Please don't hesitate to call and ask to speak to one of the nurses for clinical concerns.  If you have a medical emergency, go to the nearest  emergency room or call 911.  A surgeon from [facility] is always on call at the hospital.  For further questions, please visit [email or web address]  Post Anesthesia Home Care Instructions  Activity: Get plenty of rest for the remainder of the day. A responsible individual must stay with you for 24 hours following the procedure.  For the next 24 hours, DO NOT: -Drive a car -Paediatric nurse -Drink alcoholic beverages -Take any medication unless instructed by your physician -Make any legal decisions or sign important papers.  Meals: Start with liquid foods such as gelatin or soup. Progress to regular foods as tolerated. Avoid greasy, spicy, heavy foods. If nausea and/or vomiting occur, drink only clear liquids until the nausea and/or vomiting subsides. Call your physician if vomiting continues.  Special Instructions/Symptoms: Your throat may feel dry or sore from the anesthesia or the breathing tube placed in your throat during surgery. If this causes discomfort, gargle with warm salt water. The discomfort should disappear within 24 hours.  If you had a scopolamine patch placed behind your ear for the management of post- operative nausea and/or vomiting:  1. The medication in the patch is effective for 72 hours, after which it should be removed.  Wrap patch in a tissue and discard in the trash. Wash hands thoroughly with soap and water. 2. You may remove the patch earlier than 72 hours if you experience unpleasant side effects which may include dry mouth, dizziness or visual disturbances. 3. Avoid touching the patch. Wash your hands with soap and water after contact with the patch.    No Tylenol until 6:00PM.

## 2020-01-22 NOTE — Interval H&P Note (Signed)
History and Physical Interval Note:  01/22/2020 1:02 PM  [name]  has presented today for surgery, with the diagnosis of LEFT BREAST DUCTAL CARCINOMA IN SITU.  The various methods of treatment have been discussed with the patient and family. After consideration of risks, benefits and other options for treatment, the patient has consented to  Procedure(s) with comments: LEFT BREAST LUMPECTOMY WITH RADIOACTIVE SEED LOCALIZATION (Left) - LMA as a surgical intervention.  The patient's history has been reviewed, patient examined, no change in status, stable for surgery.  I have reviewed the patient's chart and labs.  Questions were answered to the patient's satisfaction.     Maia Petties

## 2020-01-22 NOTE — Anesthesia Procedure Notes (Signed)
Procedure Name: LMA Insertion Date/Time: 01/22/2020 1:31 PM Performed by: Lavonia Dana, CRNA Pre-anesthesia Checklist: Patient identified, Emergency Drugs available, Suction available and Patient being monitored Patient Re-evaluated:Patient Re-evaluated prior to induction Oxygen Delivery Method: Circle system utilized Preoxygenation: Pre-oxygenation with 100% oxygen Induction Type: IV induction Ventilation: Mask ventilation without difficulty LMA: LMA inserted LMA Size: 4.0 Number of attempts: 1 Airway Equipment and Method: Bite block Placement Confirmation: positive ETCO2 Tube secured with: Tape Dental Injury: Teeth and Oropharynx as per pre-operative assessment

## 2020-01-22 NOTE — Op Note (Signed)
Pre-op Diagnosis:  Left breast DCIS Post-op Diagnosis: same Procedure:  Left radioactive seed localized lumpectomy Surgeon:  Laymond Postle K. Resident:  Dr. Berniece Salines I was personally present during the key and critical portions of this procedure and immediately available throughout the entire procedure, as documented in my operative note.  Anesthesia:  GEN - LMA Indications:  This is a 65 year old female with multiple medical issues who presents after recent mammogram revealed some suspicious findings in the left upper outer quadrant at 2:00 posterior depth. She underwent stereotactic biopsy of this area. Pathology confirmed ductal carcinoma in situ, high-grade with necrosis and calcifications. On the biopsy this measures 0.6 cm in greatest linear dimension.ER 15% positive PR negative.  She presents now for lumpectomy.  Radioactive seed was placed yesterday by Radiology.    Description of procedure: The patient is brought to the operating room placed in supine position on the operating room table. After an adequate level of general anesthesia was obtained, her left  breast was prepped with ChloraPrep and draped in sterile fashion. A timeout was taken to ensure the proper patient and proper procedure. We interrogated the breast with the neoprobe. We made a transverse incision laterally over the area of activity after infiltrating with 0.25% Marcaine. Dissection was carried down in the breast tissue with cautery. We used the neoprobe to guide [place] towards the radioactive seed. We excised an area of tissue around the radioactive seed 1.5 cm in diameter. The specimen was removed and was oriented with a paint kit. Specimen mammogram showed the radioactive seed as well as the biopsy clip within the specimen. This was sent for pathologic examination. There is no residual radioactivity within the biopsy cavity. We inspected carefully for hemostasis. The wound was thoroughly irrigated. The wound was closed  with a deep layer of 3-0 Vicryl and a subcuticular layer of 4-0 Monocryl. Benzoin Steri-Strips were applied. The patient was then extubated and brought to the recovery room in stable condition. All sponge, instrument, and needle counts are correct.  Imogene Burn. Georgette Dover, MD, [facility] Surgery  General/ Trauma Surgery  01/22/2020 2:03 PM

## 2020-01-22 NOTE — Transfer of Care (Signed)
Immediate Anesthesia Transfer of Care Note  Patient: [name]  Procedure(s) Performed: LEFT BREAST LUMPECTOMY WITH RADIOACTIVE SEED LOCALIZATION (Left Breast)  Patient Location: PACU  Anesthesia Type:General  Level of Consciousness: drowsy  Airway & Oxygen Therapy: Patient Spontanous Breathing and Patient connected to face mask oxygen  Post-op Assessment: Report given to RN and Post -op Vital signs reviewed and stable  Post vital signs: Reviewed and stable  Last Vitals:  Vitals Value Taken Time  BP 95/54 01/22/20 1418  Temp    Pulse 61 01/22/20 1419  Resp 17 01/22/20 1419  SpO2 100 % 01/22/20 1419  Vitals shown include unvalidated device data.  Last Pain:  Vitals:   01/22/20 1203  TempSrc: Oral  PainSc: 0-No pain         Complications: No complications documented.

## 2020-01-22 NOTE — Anesthesia Postprocedure Evaluation (Signed)
Anesthesia Post Note  Patient: [name]  Procedure(s) Performed: LEFT BREAST LUMPECTOMY WITH RADIOACTIVE SEED LOCALIZATION (Left Breast)     Patient location during evaluation: PACU Anesthesia Type: General Level of consciousness: awake and alert Pain management: pain level controlled Vital Signs Assessment: post-procedure vital signs reviewed and stable Respiratory status: spontaneous breathing, nonlabored ventilation, respiratory function stable and patient connected to nasal cannula oxygen Cardiovascular status: blood pressure returned to baseline and stable Postop Assessment: no apparent nausea or vomiting Anesthetic complications: no   No complications documented.  Last Vitals:  Vitals:   01/22/20 1416 01/22/20 1418  BP: (!) 89/53 (!) 95/54  Pulse: (!) 56 (!) 59  Resp: 20 18  Temp:    SpO2: 98% 100%    Last Pain:  Vitals:   01/22/20 1203  TempSrc: Oral  PainSc: 0-No pain                 Kamonte Mcmichen

## 2020-01-22 NOTE — Anesthesia Preprocedure Evaluation (Addendum)
Anesthesia Evaluation  Patient identified by MRN, date of birth, ID band Patient awake    Reviewed: Allergy & Precautions, H&P , NPO status , Patient's Chart, lab work & pertinent test results  History of Anesthesia Complications (+) PONV and history of anesthetic complications  Airway Mallampati: II  TM Distance: <3 FB Neck ROM: full    Dental  (+) Teeth Intact, Dental Advisory Given   Pulmonary neg pulmonary ROS,    breath sounds clear to auscultation       Cardiovascular hypertension, Pt. on medications + Peripheral Vascular Disease and +CHF  + dysrhythmias Atrial Fibrillation + pacemaker + Cardiac Defibrillator  Rhythm:Regular Rate:Normal  ECHO 21 1. Left ventricular ejection fraction, by estimation, is 50 to 55%. The left ventricle has low normal function. The left ventricle has no regional wall motion abnormalities. Left ventricular diastolic parameters are consistent with Grade I diastolic dysfunction (impaired relaxation). The average left ventricular global longitudinal strain is -25.1 %. 2. Right ventricular systolic function is normal. The right ventricular size is normal. There is normal pulmonary artery systolic pressure. 3. The mitral valve is normal in structure. No evidence of mitral valve regurgitation. No evidence of mitral stenosis. 4. The aortic valve is normal in structure. Aortic valve regurgitation is mild. No aortic stenosis is present. 5. Aortic root/ascending aorta has been repaired/replaced. There is borderline dilatation of the ascending aorta measuring 37 mm. Comparison(s): 02/26/17 EF 60-65%. PA pressure 80mmHg.   Neuro/Psych  Headaches, Anxiety  Neuromuscular disease    GI/Hepatic GERD  ,  Endo/Other  Hypothyroidism Hyperthyroidism   Renal/GU      Musculoskeletal  (+) Arthritis ,   Abdominal   Peds  Hematology   Anesthesia Other Findings   Reproductive/Obstetrics                              Anesthesia Physical  Anesthesia Plan  ASA: III  Anesthesia Plan: General   Post-op Pain Management:    Induction: Intravenous  PONV Risk Score and Plan: 3 and Treatment may vary due to age or medical condition, Ondansetron, Dexamethasone and TIVA  Airway Management Planned: Oral ETT and LMA  Additional Equipment: None  Intra-op Plan:   Post-operative Plan: Extubation in OR  Informed Consent: I have reviewed the patients History and Physical, chart, labs and discussed the procedure including the risks, benefits and alternatives for the proposed anesthesia with the patient or authorized representative who has indicated his/her understanding and acceptance.       Plan Discussed with: CRNA and Anesthesiologist  Anesthesia Plan Comments: (  )       Anesthesia Quick Evaluation

## 2020-01-23 ENCOUNTER — Encounter (HOSPITAL_BASED_OUTPATIENT_CLINIC_OR_DEPARTMENT_OTHER): Payer: Self-pay | Admitting: Surgery

## 2020-01-23 ENCOUNTER — Other Ambulatory Visit: Payer: Self-pay | Admitting: Cardiology

## 2020-01-27 ENCOUNTER — Other Ambulatory Visit: Payer: Self-pay

## 2020-01-28 ENCOUNTER — Encounter: Payer: Self-pay | Admitting: *Deleted

## 2020-01-29 ENCOUNTER — Other Ambulatory Visit: Payer: Self-pay

## 2020-01-29 ENCOUNTER — Ambulatory Visit (INDEPENDENT_AMBULATORY_CARE_PROVIDER_SITE_OTHER): Payer: Medicare PPO | Admitting: Internal Medicine

## 2020-01-29 ENCOUNTER — Encounter: Payer: Self-pay | Admitting: Internal Medicine

## 2020-01-29 VITALS — BP 96/64 | HR 58 | Ht 67.5 in | Wt 162.0 lb

## 2020-01-29 DIAGNOSIS — I428 Other cardiomyopathies: Secondary | ICD-10-CM

## 2020-01-29 LAB — CUP PACEART INCLINIC DEVICE CHECK
Brady Statistic RA Percent Paced: 1.6 %
Brady Statistic RV Percent Paced: 94.3 %
Date Time Interrogation Session: 20220119152038
Implantable Lead Implant Date: 20130110
Implantable Lead Implant Date: 20130110
Implantable Lead Implant Date: 20130110
Implantable Lead Location: 753858
Implantable Lead Location: 753859
Implantable Lead Location: 753860
Implantable Lead Model: 4196
Implantable Lead Model: 5076
Implantable Lead Model: 6935
Implantable Pulse Generator Implant Date: 20181115
Lead Channel Pacing Threshold Amplitude: 0.75 V
Lead Channel Pacing Threshold Amplitude: 1.5 V
Lead Channel Pacing Threshold Amplitude: 2.25 V
Lead Channel Pacing Threshold Pulse Width: 0.4 ms
Lead Channel Pacing Threshold Pulse Width: 0.8 ms
Lead Channel Pacing Threshold Pulse Width: 1 ms
Lead Channel Sensing Intrinsic Amplitude: 1.5 mV
Lead Channel Sensing Intrinsic Amplitude: 18.5 mV

## 2020-01-29 LAB — SURGICAL PATHOLOGY

## 2020-01-29 NOTE — Patient Instructions (Signed)
Medication Instructions:  Your physician recommends that you continue on your current medications as directed. Please refer to the Current Medication list given to you today.  *If you need a refill on your cardiac medications before your next appointment, please call your pharmacy*   Lab Work: NONE   If you have labs (blood work) drawn today and your tests are completely normal, you will receive your results only by: . MyChart Message (if you have MyChart) OR . A paper copy in the mail If you have any lab test that is abnormal or we need to change your treatment, we will call you to review the results.   Testing/Procedures: NONE    Follow-Up: At [facility], you and your health needs are our priority.  As part of our continuing mission to provide you with exceptional heart care, we have created designated Provider Care Teams.  These Care Teams include your primary Cardiologist (physician) and Advanced Practice Providers (APPs -  Physician Assistants and Nurse Practitioners) who all work together to provide you with the care you need, when you need it.  We recommend signing up for the patient portal called "MyChart".  Sign up information is provided on this After Visit Summary.  MyChart is used to connect with patients for Virtual Visits (Telemedicine).  Patients are able to view lab/test results, encounter notes, upcoming appointments, etc.  Non-urgent messages can be sent to your provider as well.   To learn more about what you can do with MyChart, go to [email or web address].    Your next appointment:   1 year(s)  The format for your next appointment:   In Person  Provider:   Gregg Taylor, MD   Other Instructions Thank you for choosing [facility]!    

## 2020-01-29 NOTE — Progress Notes (Signed)
HPI [name] returns today for followup. She is a pleasant 65 yo woman with a h/o non-ischemic CM, LBBB, s/p BIV PPM insertion. She had her device last placed under the left pectoralis muscle. She has been diagnosed with breast CA and undergone left breast tumor excision by Dr. Georgette Schmidt. She is pending chemo/XRT and it is unclear if she will need her device moved. She has otherwise done well.  Allergies  Allergen Reactions  . Other Nausea And Vomiting and Other (See Comments)    Most narcotic pain meds cause N/V and CHEST PAIN; needs an antiemetic to tolerate  Also, patient was diagnosed with Raynaud's disease and was told to NOT place IV(s) in either hand because of poor circulation  . Tape Other (See Comments)    PATIENT'S SKIN IS VERY THIN; IT TEARS, BRUISES, AND BLEEDS VERY EASILY (PLEASE USE COBAN WRAP, IF POSSIBLE!!)  . Codeine Other (See Comments)    Chest pain  . Fentanyl Other (See Comments)    Bad dreams  . Morphine And Related Nausea And Vomiting  . Robaxin [Methocarbamol] Other (See Comments)    Chest pain     Current Outpatient Medications  Medication Sig Dispense Refill  . amitriptyline (ELAVIL) 25 MG tablet Take 22m by mouth every night  5  . CALCIUM PO Take 2 tablets by mouth daily.    . Carboxymethylcellul-Glycerin (LUBRICATING EYE DROPS OP) Place 1 drop into both eyes 2 (two) times daily.     . Cyanocobalamin 1000 MCG/ML KIT Inject 1,000 mcg every 30 (thirty) days as directed.    . cyclobenzaprine (FLEXERIL) 10 MG tablet 1 tablet    . eletriptan (RELPAX) 40 MG tablet Take 40 mg by mouth daily as needed for migraine. May repeat in 2 hours if necessary    . ELIQUIS 5 MG TABS tablet TAKE 1 TABLET BY MOUTH TWICE A DAY 180 tablet 1  . gabapentin (NEURONTIN) 600 MG tablet Take 600 mg by mouth 2 (two) times daily.    .Marland Kitchenlevothyroxine (SYNTHROID, LEVOTHROID) 100 MCG tablet Take 100 mcg daily before breakfast by mouth.    . Melatonin 3 MG TABS Take 9 mg by mouth at  bedtime.     . montelukast (SINGULAIR) 10 MG tablet Take 10 mg by mouth daily.    . Omega-3 Fatty Acids (FISH OIL) 1000 MG CAPS 1 capsule    . polyethylene glycol (MIRALAX / GLYCOLAX) packet Take 17 g every evening by mouth.    . propranolol (INDERAL) 60 MG tablet Take 60 mg by mouth daily.    .Marland Kitchenspironolactone (ALDACTONE) 25 MG tablet TAKE 1/2 TABLET BY MOUTH TWICE A DAY 90 tablet 3  . VITAMIN D PO Take 2 capsules by mouth daily.    . Menthol (BIOFREEZE) 10 % AERO      No current facility-administered medications for this visit.     Past Medical History:  Diagnosis Date  . [place]   . Arthritis   . Basal cell carcinoma 11/11/1998   left forehead-tx dr gTonia Schmidt . BCC (basal cell carcinoma) 03/11/2003   left distal lower leg ankle-tx dr gTonia Schmidt . BCC (basal cell carcinoma) 11/19/2009   left anterior lat distal lower leg  . Biventricular cardiac pacemaker in situ    a. prior CRT-D in 2013 with downgrade to CRT-Pacemaker only in 11/2016.  . Breast cancer ([place] 12/12/[place]  . Chronic systolic CHF (congestive heart failure) ([place]   . Complication of  anesthesia    aspiration  . Essential tremor   . Fibroids   . GERD (gastroesophageal reflux disease)    otc  . Hypertension   . Hyperthyroidism   . Hypothyroidism   . LBBB (left bundle branch block)    conduction disorder  . Migraines   . Mild aortic insufficiency   . NICM (nonischemic cardiomyopathy) ([place])    a. 2012: normal coronaries, EF 25-30%. b. improved to 55% 11/2016.  . Osteoporosis   . Pleural effusion, right    thoracentesis 02/09/09   . PONV (postoperative nausea and vomiting)   . Raynaud disease   . Raynaud's disease   . SCC (squamous cell carcinoma) 07/27/2009   left preauricular-dr [name]  . SCC (squamous cell carcinoma) 08/20/2018   right upper lip-[name] [name]  . Scoliosis   . Skin cancer    "squamous cell on nose, forehead, & left leg"  . Squamous cell carcinoma of skin  07/14/2004   right dorsal lateral upper nose-[name]    ROS:   All systems reviewed and negative except as noted in the HPI.   Past Surgical History:  Procedure Laterality Date  . ARTERY BIOPSY  12/29/2011   Procedure: MINOR BIOPSY TEMPORAL ARTERY;  Surgeon: Dana Jolly, MD;  Location: [place];  Service: General;  Laterality: Right;  Right temporal artery biopsy  . BACK SURGERY  12/31/2009   "for flat back syndrome; fused bottom of my back"  . BI-VENTRICULAR IMPLANTABLE CARDIOVERTER DEFIBRILLATOR Left 01/20/2011   Procedure: BI-VENTRICULAR IMPLANTABLE CARDIOVERTER DEFIBRILLATOR  (CRT-D);  Surgeon: Dana Lance, MD;  Location: [facility] CATH LAB;  Service: Cardiovascular;  Laterality: Left;  . BIV ICD GENERATOR CHANGEOUT N/A 11/24/2016   Procedure: BIV ICD GENERATOR CHANGEOUT;  Surgeon: Dana Lance, MD;  Location: [place] CV LAB;  Service: Cardiovascular;  Laterality: N/A;  . BREAST LUMPECTOMY WITH RADIOACTIVE SEED LOCALIZATION Left 01/22/2020   Procedure: LEFT BREAST LUMPECTOMY WITH RADIOACTIVE SEED LOCALIZATION;  Surgeon: Dana Mesa, MD;  Location: [place];  Service: General;  Laterality: Left;  LMA  . CHOLECYSTECTOMY  ~1996  . FOOT NEUROMA SURGERY  ~ 2003   right  . ROTATOR CUFF REPAIR  ~ 2009   left  . SHOULDER ARTHROSCOPY W/ ROTATOR CUFF REPAIR  09/21/2011  . SKIN CANCER EXCISION     nose, forehead, left leg  . TEE WITHOUT CARDIOVERSION N/A 03/01/2017   Procedure: TRANSESOPHAGEAL ECHOCARDIOGRAM (TEE);  Surgeon: Dana Latch, MD;  Location: [place];  Service: Cardiovascular;  Laterality: N/A;  . THYROID LOBECTOMY  ~ 2006   right  . TUBAL LIGATION  ~ 1983  . VAGINAL HYSTERECTOMY  ~ 2009  . VESICOVAGINAL FISTULA CLOSURE W/ TAH       Family History  Problem Relation Age of Onset  . Heart disease Mother   . Heart failure Mother 31       CHF  . Stroke Mother   . Hypertension Mother   . Heart disease Father 4  .  Macular degeneration Father   . Heart disease Sister   . Atrial fibrillation Sister   . Heart disease Sister      Social History   Socioeconomic History  . Marital status: Married    Spouse name: [name]  . Number of children: 1  . Years of education: MA  . Highest education level: Not on file  Occupational History  . Occupation: Pharmacist, hospital    Comment: n/a  Tobacco Use  . Smoking status: Never  Smoker  . Smokeless tobacco: Never Used  Vaping Use  . Vaping Use: Never used  Substance and Sexual Activity  . Alcohol use: No  . Drug use: No  . Sexual activity: Yes  Other Topics Concern  . Not on file  Social History Narrative   Patient lives at home with family.   Caffeine Use: 1-2 cups daily   Social Determinants of Health   Financial Resource Strain: Low Risk   . Difficulty of Paying Living Expenses: Not hard at all  Food Insecurity: No Food Insecurity  . Worried About Charity fundraiser in the Last Year: Never true  . Ran Out of Food in the Last Year: Never true  Transportation Needs: No Transportation Needs  . Lack of Transportation (Medical): No  . Lack of Transportation (Non-Medical): No  Physical Activity: Not on file  Stress: Not on file  Social Connections: Not on file  Intimate Partner Violence: Not on file     BP 96/64   Pulse (!) 58   Ht 5' 7.5" (1.715 m)   Wt 162 lb (73.5 kg)   SpO2 96%   BMI 25.00 kg/m   Physical Exam:  Well appearing NAD HEENT: Unremarkable Neck:  No JVD, no thyromegally Lymphatics:  No adenopathy Back:  No CVA tenderness Lungs:  Clear HEART:  Regular rate rhythm, no murmurs, no rubs, no clicks Abd:  soft, positive bowel sounds, no organomegally, no rebound, no guarding Ext:  2 plus pulses, no edema, no cyanosis, no clubbing Skin:  No rashes no nodules Neuro:  CN II through XII intact, motor grossly intact  EKG - none  DEVICE  Normal device function.  See PaceArt for details.   Assess/Plan: 1. Chronic systolic heart  failure - her symptoms are well compensated class 2A. She will continue her current meds. 2. Biv PPM - her device has over 6 years of longevity. We will recheck. 3. Breast CA - it is unclear as to whether her PPM needs to be moved. I prefer if at all possible not to move as the surgical risk is not trivial. She has had 2 prior surgeries and her infection risk is not insignificant and if she got an infection he entire system would then require extraction. I would favor monitoring the device after each XRT treatment. I plan to reach to her CA center MD's to discuss the options.  Carleene Overlie Leza Apsey,MD

## 2020-01-30 ENCOUNTER — Encounter: Payer: Self-pay | Admitting: *Deleted

## 2020-01-31 DIAGNOSIS — E538 Deficiency of other specified B group vitamins: Secondary | ICD-10-CM | POA: Diagnosis not present

## 2020-02-02 NOTE — Progress Notes (Signed)
Patient Care Team: [place], [place] as PCP - General Evans Lance, MD as PCP - Electrophysiology (Cardiology) Buford Dresser, MD as PCP - Cardiology (Cardiology)  DIAGNOSIS:    ICD-10-CM   1. Ductal carcinoma in situ (DCIS) of left breast  D05.12     SUMMARY OF ONCOLOGIC HISTORY: Oncology History  Ductal carcinoma in situ (DCIS) of left breast  12/12/2019 Initial Diagnosis   Screening mammogram on 11/27/19 showed left breast calcifications. 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 She previously underwent a left breast biopsy of the 2:00 position in 2011.   01/22/2020 Surgery   Left lumpectomy ([facility]i): high grade DCIS, 1.6cm.     CHIEF COMPLIANT: Follow-up of left breast DCIS s/p lumpectomy   INTERVAL HISTORY: [name] is a 65 y.o. with above-mentioned history of left breast DCIS. She underwent a left lumpectomy on 01/22/20 with Dr. Georgette Dover for which pathology showed high grade DCIS, 1.6cm. She presents to the clinic today to review the pathology report and discuss further treatment.  Mild soreness from recent surgery but otherwise doing quite well.  ALLERGIES:  is allergic to other, tape, codeine, fentanyl, morphine and related, and robaxin [methocarbamol].  MEDICATIONS:  Current Outpatient Medications  Medication Sig Dispense Refill  . amitriptyline (ELAVIL) 25 MG tablet Take 80m by mouth every night  5  . CALCIUM PO Take 2 tablets by mouth daily.    . Carboxymethylcellul-Glycerin (LUBRICATING EYE DROPS OP) Place 1 drop into both eyes 2 (two) times daily.     . Cyanocobalamin 1000 MCG/ML KIT Inject 1,000 mcg every 30 (thirty) days as directed.    . cyclobenzaprine (FLEXERIL) 10 MG tablet 1 tablet    . eletriptan (RELPAX) 40 MG tablet Take 40 mg by mouth daily as needed for migraine. May repeat in 2 hours if necessary    . ELIQUIS 5 MG TABS  tablet TAKE 1 TABLET BY MOUTH TWICE A DAY 180 tablet 1  . gabapentin (NEURONTIN) 600 MG tablet Take 600 mg by mouth 2 (two) times daily.    .Marland Kitchenlevothyroxine (SYNTHROID, LEVOTHROID) 100 MCG tablet Take 100 mcg daily before breakfast by mouth.    . Melatonin 3 MG TABS Take 9 mg by mouth at bedtime.     . Menthol (BIOFREEZE) 10 % AERO     . montelukast (SINGULAIR) 10 MG tablet Take 10 mg by mouth daily.    . Omega-3 Fatty Acids (FISH OIL) 1000 MG CAPS 1 capsule    . polyethylene glycol (MIRALAX / GLYCOLAX) packet Take 17 g every evening by mouth.    . propranolol (INDERAL) 60 MG tablet Take 60 mg by mouth daily.    .Marland Kitchenspironolactone (ALDACTONE) 25 MG tablet TAKE 1/2 TABLET BY MOUTH TWICE A DAY 90 tablet 3  . VITAMIN D PO Take 2 capsules by mouth daily.     No current facility-administered medications for this visit.    PHYSICAL EXAMINATION: ECOG PERFORMANCE STATUS: 1 - Symptomatic but completely ambulatory  Vitals:   02/03/20 1138  BP: (!) 116/58  Pulse: 61  Resp: 17  Temp: (!) 97.5 F (36.4 C)  SpO2: 98%   Filed Weights   02/03/20 1138  Weight: 163 lb (73.9 kg)      LABORATORY DATA:  I have reviewed the data as listed CMP Latest Ref Rng & Units 01/20/2020 02/27/2017 02/26/2017  Glucose  70 - 99 mg/dL 98 102(H) 109(H)  BUN 8 - 23 mg/dL '21 17 15  ' Creatinine 0.44 - 1.00 mg/dL 0.98 1.00 0.90  Sodium 135 - 145 mmol/L 137 140 140  Potassium 3.5 - 5.1 mmol/L 4.3 3.7 3.8  Chloride 98 - 111 mmol/L 100 102 104  CO2 22 - 32 mmol/L '27 26 26  ' Calcium 8.9 - 10.3 mg/dL 9.5 9.1 8.7(L)  Total Protein 6.5 - 8.1 g/dL - 6.1(L) -  Total Bilirubin 0.3 - 1.2 mg/dL - 0.5 -  Alkaline Phos 38 - 126 U/L - 71 -  AST 15 - 41 U/L - 30 -  ALT 14 - 54 U/L - 30 -    Lab Results  Component Value Date   WBC 11.9 (H) 02/27/2017   HGB 13.5 02/27/2017   HCT 41.4 02/27/2017   MCV 95.2 02/27/2017   PLT 306 02/27/2017   NEUTROABS 7.9 (H) 02/27/2017    ASSESSMENT & PLAN:  Ductal carcinoma in situ (DCIS)  of left breast 01/22/2020:Left lumpectomy ([facility]i): high grade DCIS, 1.6cm.  ER 15% weak, PR 0%, broadly less than 1 mm from medial/superior margin and less than 1 mm from inferior margin, focally less than 1 mm from anterior margin  Pathology counseling: I discussed the final pathology report of the patient provided  a copy of this report. I discussed the margins . We also discussed the final staging along with previously performed ER/PR  Testing.  Recommendation: 1.  She discussed regarding the margins with Dr. Georgette Dover and he felt that negative margins are adequate. 2. adjuvant radiation 3.  Follow-up adjuvant antiestrogen therapy with tamoxifen x5 years She uses vaginal estrogen cream to prevent UTIs.  I instructed her that it would be okay to use that.  Return to clinic after radiation to start antiestrogen therapy      No orders of the defined types were placed in this encounter.  The patient has a good understanding of the overall plan. she agrees with it. she will call with any problems that may develop before the next visit here.  Total time spent: 30 mins including face to face time and time spent for planning, charting and coordination of care  Nicholas Lose, MD 02/03/2020  I, Cloyde Reams Dorshimer, am acting as scribe for Dr. Nicholas Lose.  I have reviewed the above documentation for accuracy and completeness, and I agree with the above.

## 2020-02-03 ENCOUNTER — Inpatient Hospital Stay: Payer: Medicare PPO | Attending: Hematology and Oncology | Admitting: Hematology and Oncology

## 2020-02-03 ENCOUNTER — Other Ambulatory Visit: Payer: Self-pay

## 2020-02-03 DIAGNOSIS — D0512 Intraductal carcinoma in situ of left breast: Secondary | ICD-10-CM | POA: Diagnosis not present

## 2020-02-03 DIAGNOSIS — Z170 Estrogen receptor positive status [ER+]: Secondary | ICD-10-CM | POA: Diagnosis not present

## 2020-02-03 NOTE — Assessment & Plan Note (Signed)
01/22/2020:Left lumpectomy (Ts[facility]i): high grade DCIS, 1.6cm.  ER 15% weak, PR 0%, broadly less than 1 mm from medial/superior margin and less than 1 mm from inferior margin, focally less than 1 mm from anterior margin  Pathology counseling: I discussed the final pathology report of the patient provided  a copy of this report. I discussed the margins . We also discussed the final staging along with previously performed ER/PR  Testing.  Recommendation: 1.  Reexcision of margins that are very close 2. adjuvant radiation 3.  Follow-up adjuvant antiestrogen therapy with tamoxifen x5 years  Return to clinic after radiation to start antiestrogen therapy

## 2020-02-04 ENCOUNTER — Encounter: Payer: Self-pay | Admitting: *Deleted

## 2020-02-04 ENCOUNTER — Other Ambulatory Visit: Payer: Self-pay | Admitting: *Deleted

## 2020-02-04 DIAGNOSIS — D0512 Intraductal carcinoma in situ of left breast: Secondary | ICD-10-CM

## 2020-02-06 DIAGNOSIS — R300 Dysuria: Secondary | ICD-10-CM | POA: Diagnosis not present

## 2020-02-06 DIAGNOSIS — R350 Frequency of micturition: Secondary | ICD-10-CM | POA: Diagnosis not present

## 2020-02-06 DIAGNOSIS — R399 Unspecified symptoms and signs involving the genitourinary system: Secondary | ICD-10-CM | POA: Diagnosis not present

## 2020-02-06 DIAGNOSIS — N3000 Acute cystitis without hematuria: Secondary | ICD-10-CM | POA: Diagnosis not present

## 2020-02-06 DIAGNOSIS — K649 Unspecified hemorrhoids: Secondary | ICD-10-CM | POA: Diagnosis not present

## 2020-02-10 ENCOUNTER — Encounter: Payer: Self-pay | Admitting: *Deleted

## 2020-02-11 ENCOUNTER — Other Ambulatory Visit: Payer: Medicare PPO | Admitting: Plastic Surgery

## 2020-02-12 ENCOUNTER — Encounter: Payer: Self-pay | Admitting: Radiation Oncology

## 2020-02-12 ENCOUNTER — Other Ambulatory Visit: Payer: Self-pay

## 2020-02-12 ENCOUNTER — Telehealth: Payer: Self-pay

## 2020-02-12 NOTE — Telephone Encounter (Signed)
Spoke with patient in regards to telephone appointment with Shona Simpson PA on 02/19/20 @ 10:30am. Patient verbalized understanding of appointment date and time. Meaningful use questions were reviewed. TM

## 2020-02-14 ENCOUNTER — Ambulatory Visit: Payer: Self-pay | Admitting: Surgery

## 2020-02-14 NOTE — H&P (Signed)
History of Present Illness Imogene Burn. Edder Bellanca MD; 02/14/2020 10:39 AM) The patient is a 65 year old female who presents with breast cancer. PCP - Dr. [name] Films - [name]  This is a 65 year old female with multiple medical issues who presents after recent mammogram revealed some suspicious findings in the left upper outer quadrant at 2:00 posterior depth. She underwent stereotactic biopsy of this area. Pathology confirmed ductal carcinoma in situ, high-grade with necrosis and calcifications. On the biopsy this measures 0.6 cm in greatest linear dimension. Focal microinvasion cannot be ruled out on the biopsy. ER 15% positive PR negative. On 01/22/20, she underwent left radioactive seed localized lumpectomy in the left lateral breast. Pathology showed DCIS high-grade. The tumor measures 1.6 cm. The DCIS is broadly less than 1 mm from the medial/superior margin as well as the inferior margin. It is focally less than 1 mm from the anterior margin. She has had a consultation with oncology and is scheduled to see radiation oncology next week. She comes in for her first postoperative visit.  She is quite concerned about the close broad margins I would like to discuss this further. Otherwise she is not having any pain or discomfort associated with surgery.    Problem List/Past Medical 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 Amit Leece, MD; 02/14/2020 10:39 AM) Breast Biopsy Left. Foot Surgery Right. Gallbladder Surgery - Laparoscopic Hysterectomy (not due to cancer) - Complete Shoulder Surgery Right. Spinal Surgery - Lower Back Spinal Surgery Midback Thyroid Surgery  Diagnostic Studies History Imogene Burn. Kahne Helfand, MD; 02/14/2020 10:39 AM) Colonoscopy 1-5 years ago Mammogram within last year Pap Smear 1-5 years ago  Allergies Janeann Forehand, CNA; 02/14/2020 9:21 AM) Codeine Phosphate  *ANALGESICS - OPIOID* Morphine Sulfate (Bulk) *ANALGESICS - OPIOID* fentaNYL *ANALGESICS - OPIOID* Allergies Reconciled  Medication History Janeann Forehand, CNA; 02/14/2020 9:21 AM) Amoxicillin (125MG  Tablet Chewable, Oral) Active. Amoxicillin (250MG  Capsule, Oral) Active. Amitriptyline HCl (25MG  Tablet, Oral) Active. Eletriptan Hydrobromide (40MG  Tablet, Oral) Active. Eliquis (5MG  Tablet, Oral) Active. Estradiol (0.1MG /GM Cream, Vaginal) Active. Fish Oil (Oral) Specific strength unknown - Active. Gabapentin (600MG  Tablet, Oral) Active. Levothyroxine Sodium (100MCG Tablet, Oral) Active. Melatonin (10MG  Capsule, Oral) Active. MiraLax (17GM Packet, Oral) Active. Propranolol HCl (60MG  Tablet, Oral) Active. Spironolactone (25MG  Tablet, Oral) Active. Restasis (0.05% Emulsion, Ophthalmic) Active. Loratadine (10MG  Tablet, Oral) Active. Acetaminophen (500MG  Capsule, Oral) Active. Vitamin C (1 (one) Oral) Specific strength unknown - Active. Calcium/Vitamin D/Minerals (600-200MG -UNIT Tablet, 1 (one) Oral) Active. Medications Reconciled  Social History Imogene Burn. Oluwasemilore Pascuzzi, MD; 02/14/2020 10:39 AM) Caffeine use Carbonated beverages, Tea. No alcohol use No drug use Tobacco use Never smoker.  Family History Imogene Burn. Crystelle Ferrufino, MD; 02/14/2020 10:39 AM) Anesthetic complications Mother, Sister. Arthritis Mother, Sister. Depression Sister. Diabetes Mellitus Sister. Heart Disease Father, Mother. Hypertension Mother, Sister. Migraine Headache Father, Sister. Prostate Cancer Father. Thyroid problems Sister.  Pregnancy / Birth History Imogene Burn. Neda Willenbring, MD; 02/14/2020 10:39 AM) Age at menarche 21 years. Age of menopause 51-55 Contraceptive History Oral contraceptives. Gravida 1 Irregular periods Maternal age 23-30 Para 1  Other Problems Imogene Burn. Regie Bunner, MD; 02/14/2020 10:39 AM) Anxiety Disorder Atrial Fibrillation Back Pain Breast  Cancer Cancer Cholelithiasis General anesthesia - complications Hemorrhoids High blood pressure Migraine Headache Thyroid Disease     Physical Exam Rodman Key K. Lanyiah Brix MD; 02/14/2020 10:40 AM)  The physical exam findings are as follows: Note:Her left breast incision is healing well.  The anterior edge of the incision has some superficial skin irritation but no sign of cellulitis or drainage.    Assessment & Plan Imogene Burn. 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 The surgical procedure has been discussed with the patient. Potential risks, benefits, alternative treatments, and expected outcomes have been explained. All of the patient's questions at this time have been answered. The likelihood of reaching the patient's treatment goal is good. The patient understand the proposed surgical procedure and wishes to proceed. Note:After further discussion with the patient, she is quite uncomfortable with the broad close margins. This is despite the fact that she will receive radiation therapy. I think it is quite reasonable to proceed with reexcision of these margins. She may proceed with her radiation oncology consultation next week but they will likely have to delay treatment until after the reexcision.  Imogene Burn. Georgette Dover, MD, [facility]  General/ Trauma Surgery   02/14/2020 10:41 AM

## 2020-02-14 NOTE — H&P (View-Only) (Signed)
History of Present Illness [name]. Dana Deller MD; 02/14/2020 10:39 AM) The patient is a 65 year old female who presents with breast cancer. PCP - Dr. [name] Films - [name]  This is a 65 year old female with multiple medical issues who presents after recent mammogram revealed some suspicious findings in the left upper outer quadrant at 2:00 posterior depth. She underwent stereotactic biopsy of this area. Pathology confirmed ductal carcinoma in situ, high-grade with necrosis and calcifications. On the biopsy this measures 0.6 cm in greatest linear dimension. Focal microinvasion cannot be ruled out on the biopsy. ER 15% positive PR negative. On 01/22/20, she underwent left radioactive seed localized lumpectomy in the left lateral breast. Pathology showed DCIS high-grade. The tumor measures 1.6 cm. The DCIS is broadly less than 1 mm from the medial/superior margin as well as the inferior margin. It is focally less than 1 mm from the anterior margin. She has had a consultation with oncology and is scheduled to see radiation oncology next week. She comes in for her first postoperative visit.  She is quite concerned about the close broad margins I would like to discuss this further. Otherwise she is not having any pain or discomfort associated with surgery.    Problem List/Past Medical Dana Key K. 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 Leylanie Woodmansee, MD; 02/14/2020 10:39 AM) Breast Biopsy Left. Foot Surgery Right. Gallbladder Surgery - Laparoscopic Hysterectomy (not due to cancer) - Complete Shoulder Surgery Right. Spinal Surgery - Lower Back Spinal Surgery Midback Thyroid Surgery  Diagnostic Studies History [name]. Dana Spindel, MD; 02/14/2020 10:39 AM) Colonoscopy 1-5 years ago Mammogram within last year Pap Smear 1-5 years ago  Allergies Dana Forehand, CNA; 02/14/2020 9:21 AM) Codeine Phosphate  *ANALGESICS - OPIOID* Morphine Sulfate (Bulk) *ANALGESICS - OPIOID* fentaNYL *ANALGESICS - OPIOID* Allergies Reconciled  Medication History Dana Forehand, CNA; 02/14/2020 9:21 AM) Amoxicillin (125MG  Tablet Chewable, Oral) Active. Amoxicillin (250MG  Capsule, Oral) Active. Amitriptyline HCl (25MG  Tablet, Oral) Active. Eletriptan Hydrobromide (40MG  Tablet, Oral) Active. Eliquis (5MG  Tablet, Oral) Active. Estradiol (0.1MG /GM Cream, Vaginal) Active. Fish Oil (Oral) Specific strength unknown - Active. Gabapentin (600MG  Tablet, Oral) Active. Levothyroxine Sodium (100MCG Tablet, Oral) Active. Melatonin (10MG  Capsule, Oral) Active. MiraLax (17GM Packet, Oral) Active. Propranolol HCl (60MG  Tablet, Oral) Active. Spironolactone (25MG  Tablet, Oral) Active. Restasis (0.05% Emulsion, Ophthalmic) Active. Loratadine (10MG  Tablet, Oral) Active. Acetaminophen (500MG  Capsule, Oral) Active. Vitamin C (1 (one) Oral) Specific strength unknown - Active. Calcium/Vitamin D/Minerals (600-200MG -UNIT Tablet, 1 (one) Oral) Active. Medications Reconciled  Social History [name]. Dana Kerney, MD; 02/14/2020 10:39 AM) Caffeine use Carbonated beverages, Tea. No alcohol use No drug use Tobacco use Never smoker.  Family History [name]. Dana Posa, MD; 02/14/2020 10:39 AM) Anesthetic complications Mother, Sister. Arthritis Mother, Sister. Depression Sister. Diabetes Mellitus Sister. Heart Disease Father, Mother. Hypertension Mother, Sister. Migraine Headache Father, Sister. Prostate Cancer Father. Thyroid problems Sister.  Pregnancy / Birth History [name]. Dana Clagg, MD; 02/14/2020 10:39 AM) Age at menarche 21 years. Age of menopause 51-55 Contraceptive History Oral contraceptives. Gravida 1 Irregular periods Maternal age 23-30 Para 1  Other Problems [name]. Dana Cordell, MD; 02/14/2020 10:39 AM) Anxiety Disorder Atrial Fibrillation Back Pain Breast  Cancer Cancer Cholelithiasis General anesthesia - complications Hemorrhoids High blood pressure Migraine Headache Thyroid Disease     Physical Exam Dana Key K. Shreena Baines MD; 02/14/2020 10:40 AM)  The physical exam findings are as follows: Note:Her left breast incision is healing well.  The anterior edge of the incision has some superficial skin irritation but no sign of cellulitis or drainage.    Assessment & Plan ([name] K. 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 The surgical procedure has been discussed with the patient. Potential risks, benefits, alternative treatments, and expected outcomes have been explained. All of the patient's questions at this time have been answered. The likelihood of reaching the patient's treatment goal is good. The patient understand the proposed surgical procedure and wishes to proceed. Note:After further discussion with the patient, she is quite uncomfortable with the broad close margins. This is despite the fact that she will receive radiation therapy. I think it is quite reasonable to proceed with reexcision of these margins. She may proceed with her radiation oncology consultation next week but they will likely have to delay treatment until after the reexcision.  [name] K. Dana Nieman, MD, FACS [facility]  General/ Trauma Surgery   02/14/2020 10:41 AM  

## 2020-02-18 ENCOUNTER — Encounter: Payer: Self-pay | Admitting: *Deleted

## 2020-02-18 DIAGNOSIS — Z780 Asymptomatic menopausal state: Secondary | ICD-10-CM | POA: Diagnosis not present

## 2020-02-18 DIAGNOSIS — C50919 Malignant neoplasm of unspecified site of unspecified female breast: Secondary | ICD-10-CM | POA: Insufficient documentation

## 2020-02-19 ENCOUNTER — Ambulatory Visit
Admission: RE | Admit: 2020-02-19 | Discharge: 2020-02-19 | Disposition: A | Discharge status: Home / Self Care | Payer: Medicare PPO | Source: Ambulatory Visit | Attending: Radiation Oncology | Admitting: Radiation Oncology

## 2020-02-19 ENCOUNTER — Ambulatory Visit: Payer: Medicare PPO

## 2020-02-19 DIAGNOSIS — D0512 Intraductal carcinoma in situ of left breast: Secondary | ICD-10-CM

## 2020-02-20 ENCOUNTER — Ambulatory Visit: Payer: Medicare PPO | Admitting: Radiation Oncology

## 2020-02-25 ENCOUNTER — Ambulatory Visit (INDEPENDENT_AMBULATORY_CARE_PROVIDER_SITE_OTHER): Payer: Medicare PPO

## 2020-02-25 ENCOUNTER — Encounter (HOSPITAL_BASED_OUTPATIENT_CLINIC_OR_DEPARTMENT_OTHER): Payer: Self-pay | Admitting: Surgery

## 2020-02-25 ENCOUNTER — Other Ambulatory Visit: Payer: Self-pay

## 2020-02-25 DIAGNOSIS — I428 Other cardiomyopathies: Secondary | ICD-10-CM

## 2020-02-25 LAB — CUP PACEART REMOTE DEVICE CHECK
Battery Remaining Longevity: 76 mo
Battery Voltage: 2.97 V
Brady Statistic AP VP Percent: 0.05 %
Brady Statistic AP VS Percent: 0.02 %
Brady Statistic AS VP Percent: 98.21 %
Brady Statistic AS VS Percent: 1.72 %
Brady Statistic RA Percent Paced: 0.07 %
Brady Statistic RV Percent Paced: 5.84 %
Date Time Interrogation Session: 20220214213344
Implantable Lead Implant Date: 20130110
Implantable Lead Implant Date: 20130110
Implantable Lead Implant Date: 20130110
Implantable Lead Location: 753858
Implantable Lead Location: 753859
Implantable Lead Location: 753860
Implantable Lead Model: 4196
Implantable Lead Model: 5076
Implantable Lead Model: 6935
Implantable Pulse Generator Implant Date: 20181115
Lead Channel Impedance Value: 1273 Ohm
Lead Channel Impedance Value: 323 Ohm
Lead Channel Impedance Value: 399 Ohm
Lead Channel Impedance Value: 570 Ohm
Lead Channel Impedance Value: 665 Ohm
Lead Channel Impedance Value: 703 Ohm
Lead Channel Impedance Value: 741 Ohm
Lead Channel Impedance Value: 817 Ohm
Lead Channel Impedance Value: 912 Ohm
Lead Channel Pacing Threshold Amplitude: 0.625 V
Lead Channel Pacing Threshold Amplitude: 1.75 V
Lead Channel Pacing Threshold Amplitude: 2.375 V
Lead Channel Pacing Threshold Pulse Width: 0.4 ms
Lead Channel Pacing Threshold Pulse Width: 0.4 ms
Lead Channel Pacing Threshold Pulse Width: 0.8 ms
Lead Channel Sensing Intrinsic Amplitude: 0.875 mV
Lead Channel Sensing Intrinsic Amplitude: 0.875 mV
Lead Channel Sensing Intrinsic Amplitude: 19.125 mV
Lead Channel Sensing Intrinsic Amplitude: 19.125 mV
Lead Channel Setting Pacing Amplitude: 2 V
Lead Channel Setting Pacing Amplitude: 2.75 V
Lead Channel Setting Pacing Amplitude: 4 V
Lead Channel Setting Pacing Pulse Width: 0.8 ms
Lead Channel Setting Pacing Pulse Width: 1 ms
Lead Channel Setting Sensing Sensitivity: 0.9 mV

## 2020-02-26 ENCOUNTER — Encounter: Payer: Self-pay | Admitting: Internal Medicine

## 2020-02-26 DIAGNOSIS — M542 Cervicalgia: Secondary | ICD-10-CM | POA: Diagnosis not present

## 2020-02-26 DIAGNOSIS — G894 Chronic pain syndrome: Secondary | ICD-10-CM | POA: Diagnosis not present

## 2020-02-26 DIAGNOSIS — M47812 Spondylosis without myelopathy or radiculopathy, cervical region: Secondary | ICD-10-CM | POA: Diagnosis not present

## 2020-02-26 DIAGNOSIS — Z79891 Long term (current) use of opiate analgesic: Secondary | ICD-10-CM | POA: Diagnosis not present

## 2020-02-26 DIAGNOSIS — G4486 Cervicogenic headache: Secondary | ICD-10-CM | POA: Diagnosis not present

## 2020-02-26 NOTE — Progress Notes (Signed)
PERIOPERATIVE PRESCRIPTION FOR IMPLANTED CARDIAC DEVICE PROGRAMMING   Patient Information: Name: [name], [name]  DOB: [birth year]  MRN: @    Planned Procedure: breast lumpectomy re-excision  Surgeon: Georgette Dover  Date of Procedure: 03/03/2020  Cautery will be used.  Position during surgery: supine   Please send documentation back to:  [place] (Fax # [phone])   Marian Sorrow, RN  02/25/2020 12:10 PM   Device Information:   Clinic EP Physician:   Cristopher Peru, MD Device Type:  Pacemaker Manufacturer and Phone #:  [employer]: [phone] Pacemaker Dependent?:  No Date of Last Device Check:  02/24/20        Normal Device Function?:  Yes     Electrophysiologist's Recommendations:    Have magnet available.  Provide continuous ECG monitoring when magnet is used or reprogramming is to be performed.   Procedure will likely interfere with device function.  Device should be programmed:  Asynchronous pacing during procedure and returned to normal programming after procedure  Per Device Clinic Standing Orders, Drake Leach  02/26/2020 10:31 AM

## 2020-02-27 DIAGNOSIS — M71571 Other bursitis, not elsewhere classified, right ankle and foot: Secondary | ICD-10-CM | POA: Diagnosis not present

## 2020-02-27 DIAGNOSIS — M2041 Other hammer toe(s) (acquired), right foot: Secondary | ICD-10-CM | POA: Diagnosis not present

## 2020-02-28 ENCOUNTER — Encounter (HOSPITAL_BASED_OUTPATIENT_CLINIC_OR_DEPARTMENT_OTHER)
Admission: RE | Admit: 2020-02-28 | Discharge: 2020-02-28 | Disposition: A | Discharge status: Home / Self Care | Payer: Medicare PPO | Source: Ambulatory Visit | Attending: Surgery | Admitting: Surgery

## 2020-02-28 ENCOUNTER — Other Ambulatory Visit (HOSPITAL_COMMUNITY)
Admission: RE | Admit: 2020-02-28 | Discharge: 2020-02-28 | Disposition: A | Discharge status: Home / Self Care | Payer: Medicare PPO | Source: Ambulatory Visit | Attending: Surgery | Admitting: Surgery

## 2020-02-28 DIAGNOSIS — Z20822 Contact with and (suspected) exposure to covid-19: Secondary | ICD-10-CM | POA: Diagnosis not present

## 2020-02-28 DIAGNOSIS — Z01812 Encounter for preprocedural laboratory examination: Secondary | ICD-10-CM | POA: Insufficient documentation

## 2020-02-28 DIAGNOSIS — E538 Deficiency of other specified B group vitamins: Secondary | ICD-10-CM | POA: Diagnosis not present

## 2020-02-28 LAB — BASIC METABOLIC PANEL
Anion gap: 9 (ref 5–15)
BUN: 18 mg/dL (ref 8–23)
CO2: 26 mmol/L (ref 22–32)
Calcium: 9.5 mg/dL (ref 8.9–10.3)
Chloride: 103 mmol/L (ref 98–111)
Creatinine, Ser: 0.93 mg/dL (ref 0.44–1.00)
GFR, Estimated: >60 mL/min (ref >60)
Glucose, Bld: 97 mg/dL (ref 70–99)
Potassium: 4.4 mmol/L (ref 3.5–5.1)
Sodium: 138 mmol/L (ref 135–145)

## 2020-02-28 LAB — SARS CORONAVIRUS 2 (TAT 6-24 HRS): SARS Coronavirus 2: NEGATIVE

## 2020-02-28 NOTE — Progress Notes (Signed)

## 2020-03-03 ENCOUNTER — Other Ambulatory Visit: Payer: Self-pay

## 2020-03-03 ENCOUNTER — Encounter (HOSPITAL_BASED_OUTPATIENT_CLINIC_OR_DEPARTMENT_OTHER)
Admission: RE | Disposition: A | Discharge status: Home / Self Care | Payer: Self-pay | Source: Home / Self Care | Attending: Surgery

## 2020-03-03 ENCOUNTER — Ambulatory Visit (HOSPITAL_BASED_OUTPATIENT_CLINIC_OR_DEPARTMENT_OTHER): Payer: Medicare PPO | Admitting: Certified Registered Nurse Anesthetist

## 2020-03-03 ENCOUNTER — Encounter (HOSPITAL_BASED_OUTPATIENT_CLINIC_OR_DEPARTMENT_OTHER): Payer: Self-pay | Admitting: Surgery

## 2020-03-03 ENCOUNTER — Ambulatory Visit (HOSPITAL_BASED_OUTPATIENT_CLINIC_OR_DEPARTMENT_OTHER)
Admission: RE | Admit: 2020-03-03 | Discharge: 2020-03-03 | Disposition: A | Discharge status: Home / Self Care | Payer: Medicare PPO | Attending: Surgery | Admitting: Surgery

## 2020-03-03 DIAGNOSIS — D0512 Intraductal carcinoma in situ of left breast: Secondary | ICD-10-CM | POA: Diagnosis not present

## 2020-03-03 DIAGNOSIS — Z79890 Hormone replacement therapy (postmenopausal): Secondary | ICD-10-CM | POA: Diagnosis not present

## 2020-03-03 DIAGNOSIS — I447 Left bundle-branch block, unspecified: Secondary | ICD-10-CM | POA: Diagnosis not present

## 2020-03-03 DIAGNOSIS — I480 Paroxysmal atrial fibrillation: Secondary | ICD-10-CM | POA: Diagnosis not present

## 2020-03-03 DIAGNOSIS — Z885 Allergy status to narcotic agent status: Secondary | ICD-10-CM | POA: Diagnosis not present

## 2020-03-03 DIAGNOSIS — Z792 Long term (current) use of antibiotics: Secondary | ICD-10-CM | POA: Diagnosis not present

## 2020-03-03 DIAGNOSIS — Z7901 Long term (current) use of anticoagulants: Secondary | ICD-10-CM | POA: Diagnosis not present

## 2020-03-03 DIAGNOSIS — Z79899 Other long term (current) drug therapy: Secondary | ICD-10-CM | POA: Insufficient documentation

## 2020-03-03 DIAGNOSIS — E039 Hypothyroidism, unspecified: Secondary | ICD-10-CM | POA: Diagnosis not present

## 2020-03-03 HISTORY — PX: RE-EXCISION OF BREAST LUMPECTOMY: SHX6048

## 2020-03-03 SURGERY — EXCISION, LESION, BREAST
Anesthesia: General | Site: Breast | Laterality: Left

## 2020-03-03 MED ORDER — PHENYLEPHRINE 40 MCG/ML (10ML) SYRINGE FOR IV PUSH (FOR BLOOD PRESSURE SUPPORT)
PREFILLED_SYRINGE | INTRAVENOUS | Status: AC
  Filled 2020-03-03: qty 10

## 2020-03-03 MED ORDER — PROPOFOL 500 MG/50ML IV EMUL
INTRAVENOUS | Status: AC
  Filled 2020-03-03: qty 50

## 2020-03-03 MED ORDER — LIDOCAINE 2% (20 MG/ML) 5 ML SYRINGE
INTRAMUSCULAR | Status: AC
  Filled 2020-03-03: qty 5

## 2020-03-03 MED ORDER — FENTANYL CITRATE (PF) 100 MCG/2ML IJ SOLN
INTRAMUSCULAR | Status: AC
  Filled 2020-03-03: qty 2

## 2020-03-03 MED ORDER — ACETAMINOPHEN 500 MG PO TABS
ORAL_TABLET | ORAL | Status: AC
  Filled 2020-03-03: qty 2

## 2020-03-03 MED ORDER — PROPOFOL 10 MG/ML IV BOLUS
INTRAVENOUS | Status: AC
  Filled 2020-03-03: qty 20

## 2020-03-03 MED ORDER — MIDAZOLAM HCL 2 MG/2ML IJ SOLN
INTRAMUSCULAR | Status: DC | PRN
  Administered 2020-03-03: 2 mg via INTRAVENOUS

## 2020-03-03 MED ORDER — CHLORHEXIDINE GLUCONATE CLOTH 2 % EX PADS: 6.0000 | MEDICATED_PAD | Freq: Once | CUTANEOUS | Status: DC

## 2020-03-03 MED ORDER — AMISULPRIDE (ANTIEMETIC) 5 MG/2ML IV SOLN: 10.0000 mg | Freq: Once | INTRAVENOUS | Status: DC | PRN

## 2020-03-03 MED ORDER — PROMETHAZINE HCL 25 MG/ML IJ SOLN: 6.2500 mg | INTRAMUSCULAR | Status: DC | PRN

## 2020-03-03 MED ORDER — PROPOFOL 10 MG/ML IV BOLUS
INTRAVENOUS | Status: DC | PRN
  Administered 2020-03-03: 150 mg via INTRAVENOUS
  Administered 2020-03-03: 50 mg via INTRAVENOUS

## 2020-03-03 MED ORDER — LIDOCAINE HCL (CARDIAC) PF 100 MG/5ML IV SOSY
PREFILLED_SYRINGE | INTRAVENOUS | Status: DC | PRN
  Administered 2020-03-03: 60 mg via INTRATRACHEAL

## 2020-03-03 MED ORDER — DEXAMETHASONE SODIUM PHOSPHATE 10 MG/ML IJ SOLN
INTRAMUSCULAR | Status: DC | PRN
  Administered 2020-03-03: 8 mg via INTRAVENOUS

## 2020-03-03 MED ORDER — CEFAZOLIN SODIUM-DEXTROSE 2-4 GM/100ML-% IV SOLN
INTRAVENOUS | Status: AC
  Filled 2020-03-03: qty 100

## 2020-03-03 MED ORDER — PHENYLEPHRINE HCL (PRESSORS) 10 MG/ML IV SOLN
INTRAVENOUS | Status: DC | PRN
  Administered 2020-03-03: 120 ug via INTRAVENOUS
  Administered 2020-03-03 (×2): 80 ug via INTRAVENOUS

## 2020-03-03 MED ORDER — OXYCODONE HCL 5 MG/5ML PO SOLN: 5.0000 mg | Freq: Once | ORAL | Status: DC | PRN

## 2020-03-03 MED ORDER — CEFAZOLIN SODIUM-DEXTROSE 2-4 GM/100ML-% IV SOLN
2.0000 g | INTRAVENOUS | Status: AC
  Administered 2020-03-03: 2 g via INTRAVENOUS

## 2020-03-03 MED ORDER — PROPOFOL 500 MG/50ML IV EMUL
INTRAVENOUS | Status: DC | PRN
  Administered 2020-03-03: 125 ug/kg/min via INTRAVENOUS

## 2020-03-03 MED ORDER — ONDANSETRON HCL 4 MG/2ML IJ SOLN
INTRAMUSCULAR | Status: DC | PRN
  Administered 2020-03-03: 4 mg via INTRAVENOUS

## 2020-03-03 MED ORDER — ACETAMINOPHEN 500 MG PO TABS
1000.0000 mg | ORAL_TABLET | ORAL | Status: AC
  Administered 2020-03-03: 1000 mg via ORAL

## 2020-03-03 MED ORDER — MIDAZOLAM HCL 2 MG/2ML IJ SOLN
INTRAMUSCULAR | Status: AC
  Filled 2020-03-03: qty 2

## 2020-03-03 MED ORDER — LACTATED RINGERS IV SOLN: INTRAVENOUS | Status: DC

## 2020-03-03 MED ORDER — ONDANSETRON HCL 4 MG/2ML IJ SOLN
INTRAMUSCULAR | Status: AC
  Filled 2020-03-03: qty 2

## 2020-03-03 MED ORDER — OXYCODONE HCL 5 MG PO TABS: 5.0000 mg | ORAL_TABLET | Freq: Once | ORAL | Status: DC | PRN

## 2020-03-03 MED ORDER — HYDROMORPHONE HCL 1 MG/ML IJ SOLN: 0.2500 mg | INTRAMUSCULAR | Status: DC | PRN

## 2020-03-03 MED ORDER — LACTATED RINGERS IV SOLN: INTRAVENOUS | Status: DC | PRN

## 2020-03-03 MED ORDER — KETOROLAC TROMETHAMINE 30 MG/ML IJ SOLN: 30.0000 mg | Freq: Once | INTRAMUSCULAR | Status: DC | PRN

## 2020-03-03 MED ORDER — DEXAMETHASONE SODIUM PHOSPHATE 10 MG/ML IJ SOLN
INTRAMUSCULAR | Status: AC
  Filled 2020-03-03: qty 1

## 2020-03-03 MED ORDER — BUPIVACAINE-EPINEPHRINE 0.25% -1:200000 IJ SOLN
INTRAMUSCULAR | Status: DC | PRN
  Administered 2020-03-03: 10 mL

## 2020-03-03 SURGICAL SUPPLY — 41 items
BENZOIN TINCTURE PRP APPL 2/3 (GAUZE/BANDAGES/DRESSINGS) ×2 IMPLANT
BLADE HEX COATED 2.75 (ELECTRODE) ×2 IMPLANT
BLADE SURG 15 STRL LF DISP TIS (BLADE) ×1 IMPLANT
BLADE SURG 15 STRL SS (BLADE) ×2
CANISTER SUCT 1200ML W/VALVE (MISCELLANEOUS) ×2 IMPLANT
CHLORAPREP W/TINT 26 (MISCELLANEOUS) ×2 IMPLANT
COVER BACK TABLE 60X90IN (DRAPES) ×2 IMPLANT
COVER MAYO STAND STRL (DRAPES) ×2 IMPLANT
DECANTER SPIKE VIAL GLASS SM (MISCELLANEOUS) ×2 IMPLANT
DRAPE LAPAROTOMY 100X72 PEDS (DRAPES) ×2 IMPLANT
DRAPE UTILITY XL STRL (DRAPES) ×2 IMPLANT
DRSG TEGADERM 4X4.75 (GAUZE/BANDAGES/DRESSINGS) ×2 IMPLANT
ELECT REM PT RETURN 9FT ADLT (ELECTROSURGICAL) ×2
ELECTRODE REM PT RTRN 9FT ADLT (ELECTROSURGICAL) ×1 IMPLANT
GAUZE SPONGE 4X4 12PLY STRL LF (GAUZE/BANDAGES/DRESSINGS) ×2 IMPLANT
GLOVE SURG ENC MOIS LTX SZ6.5 (GLOVE) ×2 IMPLANT
GLOVE SURG ENC MOIS LTX SZ7 (GLOVE) ×2 IMPLANT
GLOVE SURG UNDER POLY LF SZ7 (GLOVE) ×2 IMPLANT
GLOVE SURG UNDER POLY LF SZ7.5 (GLOVE) ×2 IMPLANT
GOWN STRL REUS W/ TWL LRG LVL3 (GOWN DISPOSABLE) ×2 IMPLANT
GOWN STRL REUS W/TWL LRG LVL3 (GOWN DISPOSABLE) ×4
KIT MARKER MARGIN INK (KITS) ×2 IMPLANT
NEEDLE HYPO 25X1 1.5 SAFETY (NEEDLE) ×2 IMPLANT
NS IRRIG 1000ML POUR BTL (IV SOLUTION) ×2 IMPLANT
PACK BASIN DAY SURGERY FS (CUSTOM PROCEDURE TRAY) ×2 IMPLANT
PENCIL SMOKE EVACUATOR (MISCELLANEOUS) ×2 IMPLANT
SLEEVE SCD COMPRESS KNEE MED (MISCELLANEOUS) ×2 IMPLANT
SPONGE GAUZE 2X2 8PLY STRL LF (GAUZE/BANDAGES/DRESSINGS) IMPLANT
SPONGE LAP 4X18 RFD (DISPOSABLE) ×2 IMPLANT
STRIP CLOSURE SKIN 1/2X4 (GAUZE/BANDAGES/DRESSINGS) ×2 IMPLANT
SUT CHROMIC 3 0 SH 27 (SUTURE) IMPLANT
SUT MON AB 4-0 PC3 18 (SUTURE) ×2 IMPLANT
SUT SILK 2 0 SH (SUTURE) IMPLANT
SUT VIC AB 3-0 SH 27 (SUTURE) ×2
SUT VIC AB 3-0 SH 27X BRD (SUTURE) ×1 IMPLANT
SYR BULB EAR ULCER 3OZ GRN STR (SYRINGE) ×2 IMPLANT
SYR CONTROL 10ML LL (SYRINGE) ×2 IMPLANT
TOWEL GREEN STERILE FF (TOWEL DISPOSABLE) ×2 IMPLANT
TRAY FAXITRON CT DISP (TRAY / TRAY PROCEDURE) IMPLANT
TUBE CONNECTING 20X1/4 (TUBING) ×2 IMPLANT
YANKAUER SUCT BULB TIP NO VENT (SUCTIONS) ×2 IMPLANT

## 2020-03-03 NOTE — Anesthesia Procedure Notes (Signed)
Procedure Name: LMA Insertion Date/Time: 03/03/2020 7:40 AM Performed by: Kathryne Hitch, CRNA Pre-anesthesia Checklist: Patient identified, Emergency Drugs available, Suction available and Patient being monitored Patient Re-evaluated:Patient Re-evaluated prior to induction Oxygen Delivery Method: Circle system utilized Preoxygenation: Pre-oxygenation with 100% oxygen Induction Type: IV induction Ventilation: Mask ventilation without difficulty LMA: LMA inserted LMA Size: 4.0 Number of attempts: 1 Placement Confirmation: positive ETCO2 Tube secured with: Tape Dental Injury: Teeth and Oropharynx as per pre-operative assessment

## 2020-03-03 NOTE — Op Note (Signed)
Preop diagnosis: Left breast ductal carcinoma in situ with close margins Postop diagnosis: Same Procedure performed: Reexcision of left lumpectomy site medial, inferior, and anterior margins Surgeon:Maliea Grandmaison K Monserrat Vidaurri Anesthesia: General Indications: This is a 65 year old female who recently underwent left radioactive seed localized lumpectomy for DCIS.  The tumor was larger than expected.  The DCIS was less than 1 mm from the superior portion of the medial margin, the inferior margin, and focally less than 1 mm from the anterior margin.  After further discussion, the patient has opted for reexcision of these margins.  Description of procedure: The patient is brought to the operating room placed in the supine position on the operating room table.  After adequate level of general anesthesia was obtained, her left lateral breast was prepped with ChloraPrep draped in sterile fashion.  A timeout was taken to ensure the proper patient and proper procedure.  We infiltrated her previous incision with 0.25% Marcaine with epinephrine.  We opened the previous incision.  We dissected into the biopsy cavity and evacuated the seroma.  I then reexcised the entire medial margin.  We also excised the inferior margin, taking an additional 1 cm of breast tissue.  I also excised another centimeter of anterior margin.  All of the specimens were oriented with the paint kit.  These were sent for pathologic examination.  The wound was irrigated and inspected for hemostasis.  We closed with 3-0 Vicryl and 4-0 Monocryl.  Benzoin and Steri-Strips were applied.  The patient was then extubated brought to the recovery room in stable condition.  All sponge, instrument, and needle counts are correct.  Imogene Burn. Georgette Dover, MD, [facility]  General/ Trauma Surgery   03/03/2020 8:14 AM

## 2020-03-03 NOTE — Discharge Instructions (Signed)
[place] Office Phone Number [phone]  BREAST BIOPSY/ PARTIAL MASTECTOMY: POST OP INSTRUCTIONS  Always review your discharge instruction sheet given to you by the facility where your surgery was performed.  IF YOU HAVE DISABILITY OR FAMILY LEAVE FORMS, YOU MUST BRING THEM TO THE OFFICE FOR PROCESSING.  DO NOT GIVE THEM TO YOUR DOCTOR.  1. A prescription for pain medication may be given to you upon discharge.  Take your pain medication as prescribed, if needed.  If narcotic pain medicine is not needed, then you may take acetaminophen (Tylenol) or ibuprofen (Advil) as needed. 2. Take your usually prescribed medications unless otherwise directed 3. If you need a refill on your pain medication, please contact your pharmacy.  They will contact our office to request authorization.  Prescriptions will not be filled after 5pm or on week-ends. 4. You should eat very light the first 24 hours after surgery, such as soup, crackers, pudding, etc.  Resume your normal diet the day after surgery. 5. Most patients will experience some swelling and bruising in the breast.  Ice packs and a good support bra will help.  Swelling and bruising can take several days to resolve.  6. It is common to experience some constipation if taking pain medication after surgery.  Increasing fluid intake and taking a stool softener will usually help or prevent this problem from occurring.  A mild laxative (Milk of Magnesia or Miralax) should be taken according to package directions if there are no bowel movements after 48 hours. 7. Unless discharge instructions indicate otherwise, you may remove your bandages 24-48 hours after surgery, and you may shower at that time.  You may have steri-strips (small skin tapes) in place directly over the incision.  These strips should be left on the skin for 7-10 days.  If your surgeon used skin glue on the incision, you may shower in 24 hours.  The glue will flake off over the  next 2-3 weeks.  Any sutures or staples will be removed at the office during your follow-up visit. 8. ACTIVITIES:  You may resume regular daily activities (gradually increasing) beginning the next day.  Wearing a good support bra or sports bra minimizes pain and swelling.  You may have sexual intercourse when it is comfortable. a. You may drive when you no longer are taking prescription pain medication, you can comfortably wear a seatbelt, and you can safely maneuver your car and apply brakes. b. RETURN TO WORK:  ______________________________________________________________________________________ 9. You should see your doctor in the office for a follow-up appointment approximately two weeks after your surgery.  Your doctor's nurse will typically make your follow-up appointment when she calls you with your pathology report.  Expect your pathology report 2-3 business days after your surgery.  You may call to check if you do not hear from [place] after three days. 10. OTHER INSTRUCTIONS: _______________________________________________________________________________________________ _____________________________________________________________________________________________________________________________________ _____________________________________________________________________________________________________________________________________ _____________________________________________________________________________________________________________________________________  WHEN TO CALL YOUR DOCTOR: 1. Fever over 101.0 2. Nausea and/or vomiting. 3. Extreme swelling or bruising. 4. Continued bleeding from incision. 5. Increased pain, redness, or drainage from the incision.  The clinic staff is available to answer your questions during regular business hours.  Please don't hesitate to call and ask to speak to one of the nurses for clinical concerns.  If you have a medical emergency, go to the nearest  emergency room or call 911.  A surgeon from [facility] is always on call at the hospital.  For further questions, please visit [email or web address]  Post Anesthesia Home Care Instructions  Activity: Get plenty of rest for the remainder of the day. A responsible individual must stay with you for 24 hours following the procedure.  For the next 24 hours, DO NOT: -Drive a car -Paediatric nurse -Drink alcoholic beverages -Take any medication unless instructed by your physician -Make any legal decisions or sign important papers.  Meals: Start with liquid foods such as gelatin or soup. Progress to regular foods as tolerated. Avoid greasy, spicy, heavy foods. If nausea and/or vomiting occur, drink only clear liquids until the nausea and/or vomiting subsides. Call your physician if vomiting continues.  Special Instructions/Symptoms: Your throat may feel dry or sore from the anesthesia or the breathing tube placed in your throat during surgery. If this causes discomfort, gargle with warm salt water. The discomfort should disappear within 24 hours.  If you had a scopolamine patch placed behind your ear for the management of post- operative nausea and/or vomiting:  1. The medication in the patch is effective for 72 hours, after which it should be removed.  Wrap patch in a tissue and discard in the trash. Wash hands thoroughly with soap and water. 2. You may remove the patch earlier than 72 hours if you experience unpleasant side effects which may include dry mouth, dizziness or visual disturbances. 3. Avoid touching the patch. Wash your hands with soap and water after contact with the patch.     Do not take Tylenol before 12:40pm today, if needed.

## 2020-03-03 NOTE — Anesthesia Postprocedure Evaluation (Signed)
Anesthesia Post Note  Patient: [name]  Procedure(s) Performed: RE-EXCISION MARGINS OF LEFT BREAST LUMPECTOMY (Left Breast)     Patient location during evaluation: PACU Anesthesia Type: General Level of consciousness: awake and alert, oriented and patient cooperative Pain management: pain level controlled Vital Signs Assessment: post-procedure vital signs reviewed and stable Respiratory status: spontaneous breathing, nonlabored ventilation and respiratory function stable Cardiovascular status: blood pressure returned to baseline and stable Postop Assessment: no apparent nausea or vomiting Anesthetic complications: no   No complications documented.  Last Vitals:  Vitals:   03/03/20 0830 03/03/20 0845  BP: (!) 89/58 100/61  Pulse: (!) 55 (!) 55  Resp: 16 17  Temp:    SpO2: 99% 100%    Last Pain:  Vitals:   03/03/20 0845  TempSrc:   PainSc: 0-No pain                 Pervis Hocking

## 2020-03-03 NOTE — Progress Notes (Signed)
Remote pacemaker transmission.   

## 2020-03-03 NOTE — Transfer of Care (Signed)
Immediate Anesthesia Transfer of Care Note  Patient: [name]  Procedure(s) Performed: RE-EXCISION MARGINS OF LEFT BREAST LUMPECTOMY (Left Breast)  Patient Location: PACU  Anesthesia Type:General  Level of Consciousness: drowsy and patient cooperative  Airway & Oxygen Therapy: Patient Spontanous Breathing and Patient connected to face mask oxygen  Post-op Assessment: Report given to RN and Post -op Vital signs reviewed and stable  Post vital signs: Reviewed and stable  Last Vitals:  Vitals Value Taken Time  BP 77/47 03/03/20 0812  Temp    Pulse 59 03/03/20 0813  Resp 20 03/03/20 0813  SpO2 97 % 03/03/20 0813  Vitals shown include unvalidated device data.  Last Pain:  Vitals:   03/03/20 0636  TempSrc: Oral  PainSc: 0-No pain         Complications: No complications documented.

## 2020-03-03 NOTE — Anesthesia Preprocedure Evaluation (Addendum)
Anesthesia Evaluation  Patient identified by MRN, date of birth, ID band Patient awake    Reviewed: Allergy & Precautions, H&P , NPO status , Patient's Chart, lab work & pertinent test results, reviewed documented beta blocker date and time   History of Anesthesia Complications (+) PONV and history of anesthetic complications (hx aspiration)  Airway Mallampati: III  TM Distance: >3 FB Neck ROM: Full    Dental no notable dental hx. (+) Teeth Intact, Dental Advisory Given   Pulmonary neg pulmonary ROS,    Pulmonary exam normal breath sounds clear to auscultation       Cardiovascular hypertension, Pt. on home beta blockers and Pt. on medications +CHF (grade 1 diastolic dysfunction , hx NICM with LVEF 25% but has improved)  Normal cardiovascular exam+ dysrhythmias (eliquis) Atrial Fibrillation + pacemaker + Cardiac Defibrillator  Rhythm:Regular Rate:Normal  Echo 2021: 1. Left ventricular ejection fraction, by estimation, is 50 to 55%. The left ventricle has low normal function. The left ventricle has no regional wall motion abnormalities. Left ventricular diastolic parameters are consistent with Grade I diastolic dysfunction (impaired relaxation). The average left ventricular global longitudinal strain is -25.1 %.  2. Right ventricular systolic function is normal. The right ventricular size is normal. There is normal pulmonary artery systolic pressure.  3. The mitral valve is normal in structure. No evidence of mitral valve regurgitation. No evidence of mitral stenosis.  4. The aortic valve is normal in structure. Aortic valve regurgitation is  mild. No aortic stenosis is present.  5. Aortic root/ascending aorta has been repaired/replaced. 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    Neuro/Psych  Headaches, PSYCHIATRIC DISORDERS Anxiety    GI/Hepatic Neg liver ROS, GERD  Medicated and Controlled,   Endo/Other  Hypothyroidism   Renal/GU negative Renal ROS  Female GU complaint     Musculoskeletal  (+) Arthritis , Osteoarthritis,    Abdominal   Peds negative pediatric ROS (+)  Hematology negative hematology ROS (+)   Anesthesia Other Findings L breast DCIS  Reproductive/Obstetrics negative OB ROS                           Anesthesia Physical Anesthesia Plan  ASA: III  Anesthesia Plan: General   Post-op Pain Management:    Induction: Intravenous  PONV Risk Score and Plan: 4 or greater and Ondansetron, Dexamethasone, Midazolam, Treatment may vary due to age or medical condition, Scopolamine patch - Pre-op, Propofol infusion and TIVA  Airway Management Planned: LMA  Additional Equipment: None  Intra-op Plan:   Post-operative Plan: Extubation in OR  Informed Consent: I have reviewed the patients History and Physical, chart, labs and discussed the procedure including the risks, benefits and alternatives for the proposed anesthesia with the patient or authorized representative who has indicated his/her understanding and acceptance.     Dental advisory given  Plan Discussed with: CRNA  Anesthesia Plan Comments: (Multiple allergies to pain medications  Needs antiemetic w/ pain meds)        Anesthesia Quick Evaluation

## 2020-03-03 NOTE — Interval H&P Note (Signed)
History and Physical Interval Note:  03/03/2020 7:06 AM  [name]  has presented today for surgery, with the diagnosis of DUCTAL CARCINOMA IN SITU- LEFT BREAST.  The various methods of treatment have been discussed with the patient and family. After consideration of risks, benefits and other options for treatment, the patient has consented to  Procedure(s) with comments: RE-EXCISION OF LEFT BREAST LUMPECTOMY MEDIAL AND INFERIOR MARGINS (Left) - LMA as a surgical intervention.  The patient's history has been reviewed, patient examined, no change in status, stable for surgery.  I have reviewed the patient's chart and labs.  Questions were answered to the patient's satisfaction.     Maia Petties

## 2020-03-04 ENCOUNTER — Encounter (HOSPITAL_BASED_OUTPATIENT_CLINIC_OR_DEPARTMENT_OTHER): Payer: Self-pay | Admitting: Surgery

## 2020-03-04 LAB — SURGICAL PATHOLOGY

## 2020-03-09 ENCOUNTER — Encounter: Payer: Self-pay | Admitting: *Deleted

## 2020-03-18 ENCOUNTER — Ambulatory Visit: Payer: Medicare PPO | Admitting: Cardiology

## 2020-03-25 ENCOUNTER — Ambulatory Visit
Admission: RE | Admit: 2020-03-25 | Discharge: 2020-03-25 | Disposition: A | Discharge status: Home / Self Care | Payer: Medicare PPO | Source: Ambulatory Visit | Attending: Radiation Oncology | Admitting: Radiation Oncology

## 2020-03-25 ENCOUNTER — Ambulatory Visit: Payer: Medicare PPO | Admitting: Radiation Oncology

## 2020-03-25 ENCOUNTER — Encounter: Payer: Self-pay | Admitting: Radiation Oncology

## 2020-03-25 ENCOUNTER — Other Ambulatory Visit: Payer: Self-pay

## 2020-03-25 VITALS — BP 109/58 | HR 61 | Temp 96.8°F | Resp 18 | Ht 67.0 in | Wt 163.4 lb

## 2020-03-25 DIAGNOSIS — Z85828 Personal history of other malignant neoplasm of skin: Secondary | ICD-10-CM | POA: Diagnosis not present

## 2020-03-25 DIAGNOSIS — I5022 Chronic systolic (congestive) heart failure: Secondary | ICD-10-CM | POA: Insufficient documentation

## 2020-03-25 DIAGNOSIS — Z7901 Long term (current) use of anticoagulants: Secondary | ICD-10-CM | POA: Insufficient documentation

## 2020-03-25 DIAGNOSIS — I7300 Raynaud's syndrome without gangrene: Secondary | ICD-10-CM | POA: Diagnosis not present

## 2020-03-25 DIAGNOSIS — I480 Paroxysmal atrial fibrillation: Secondary | ICD-10-CM | POA: Diagnosis not present

## 2020-03-25 DIAGNOSIS — Z170 Estrogen receptor positive status [ER+]: Secondary | ICD-10-CM | POA: Insufficient documentation

## 2020-03-25 DIAGNOSIS — M129 Arthropathy, unspecified: Secondary | ICD-10-CM | POA: Diagnosis not present

## 2020-03-25 DIAGNOSIS — Z79899 Other long term (current) drug therapy: Secondary | ICD-10-CM | POA: Insufficient documentation

## 2020-03-25 DIAGNOSIS — I110 Hypertensive heart disease with heart failure: Secondary | ICD-10-CM | POA: Diagnosis not present

## 2020-03-25 DIAGNOSIS — E039 Hypothyroidism, unspecified: Secondary | ICD-10-CM | POA: Insufficient documentation

## 2020-03-25 DIAGNOSIS — I10 Essential (primary) hypertension: Secondary | ICD-10-CM | POA: Diagnosis not present

## 2020-03-25 DIAGNOSIS — M810 Age-related osteoporosis without current pathological fracture: Secondary | ICD-10-CM | POA: Diagnosis not present

## 2020-03-25 DIAGNOSIS — K219 Gastro-esophageal reflux disease without esophagitis: Secondary | ICD-10-CM | POA: Insufficient documentation

## 2020-03-25 DIAGNOSIS — D0512 Intraductal carcinoma in situ of left breast: Secondary | ICD-10-CM | POA: Insufficient documentation

## 2020-03-25 NOTE — Progress Notes (Signed)
Radiation Oncology         [phone] ________________________________  Name: [name]        MRN: [id number]  Date of Service: 03/25/2020 DOB: Jul 08, 1955  JK:DTOIZ, [name], Dana Dicker, MD     REFERRING PHYSICIAN: Nicholas Lose, MD   DIAGNOSIS: The encounter diagnosis was Ductal carcinoma in situ (DCIS) of left breast.   HISTORY OF PRESENT ILLNESS: [name] is a 65 y.o. female seen for a diagnosis of left breast cancer. The patient was noted to have screening mammogram which revealed calcifications in a linear pattern.  She underwent biopsy on 12/12/2019 which revealed high-grade DCIS with associated necrosis focal microinvasion could not be ruled out.  Her cancer was ER positive PR negative.  She is scheduled to undergo lumpectomy on 01/22/2019 2 with Dr. Georgette Schmidt, and has met with Dr. Lindi Schmidt.  She is seen today to discuss the options of adjuvant radiation as a part of her treatment plan as well. Following her last visit we reached out to cardiology, they prefer not to relocate her ICD but for monitoring following.  We are trying to coordinate with them to make sure that the appropriate monitoring they recommend is followed.  Since her last visit however she did undergo lumpectomy on 01/22/2020 showing 1.6 cm of high-grade DCIS, the DCIS broadly was less than 1 mm from the medial slip/superior margin, broadly less than 1 mm from the inferior margin, and focally less than 1 mm from the anterior margin with this, she underwent re-excision of her margins on 03/03/2020 which revealed no residual DCIS in the left medial margin or in the left anterior margin.  Focal residual DCIS was seen 2 mm from the superior lateral margin and 4 mm from the inferior margin.  Following her last visit we reached out to cardiology, they prefer not to relocate her ICD but for monitoring following.  We are trying to coordinate with them to make sure that the appropriate monitoring they  recommend is followed.  Since her last visit however she did undergo lumpectomy on 01/22/2020 showing 1.6 cm of high-grade DCIS, the DCIS broadly was less than 1 mm from the medial slip/superior margin, broadly less than 1 mm from the inferior margin, and focally less than 1 mm from the anterior margin with this, she underwent re-excision of her margins on 03/03/2020 which revealed no residual DCIS in the left medial margin or in the left anterior margin.  Focal residual DCIS was seen 2 mm from the superior lateral margin and 4 mm from the inferior margin.  Given these findings she is seen today to discuss treatment recommendations for adjuvant therapy.    PREVIOUS RADIATION THERAPY: No   PAST MEDICAL HISTORY:  Past Medical History:  Diagnosis Date  . AF (paroxysmal atrial fibrillation) ([place])   . Arthritis   . Basal cell carcinoma 11/11/1998   left forehead-tx dr [name]  . BCC (basal cell carcinoma) 03/11/2003   left distal lower leg ankle-tx dr [name]  . BCC (basal cell carcinoma) 11/19/2009   left anterior lat distal lower leg  . Biventricular cardiac pacemaker in situ    a. prior CRT-D in 2013 with downgrade to CRT-Pacemaker only in 11/2016.  . Breast cancer ([place]) 12/12/2019  . Chronic systolic CHF (congestive heart failure) ([place])   . Complication of anesthesia    aspiration  . Essential tremor   . Fibroids   . GERD (gastroesophageal reflux disease)  otc  . Hypertension   . Hyperthyroidism   . Hypothyroidism   . LBBB (left bundle branch block)    conduction disorder  . Migraines   . Mild aortic insufficiency   . NICM (nonischemic cardiomyopathy) ([place])    a. 2012: normal coronaries, EF 25-30%. b. improved to 55% 11/2016.  . Osteoporosis   . Pleural effusion, right    thoracentesis 02/09/09   . PONV (postoperative nausea and vomiting)   . Raynaud disease   . Raynaud's disease   . SCC (squamous cell carcinoma) 07/27/2009   left preauricular-dr [name]  . SCC (squamous cell  carcinoma) 08/20/2018   right upper lip-mohs [name]  . Scoliosis   . Skin cancer    "squamous cell on nose, forehead, & left leg"  . Squamous cell carcinoma of skin 07/14/2004   right dorsal lateral upper nose-mohs       PAST SURGICAL HISTORY: Past Surgical History:  Procedure Laterality Date  . ARTERY BIOPSY  12/29/2011   Procedure: MINOR BIOPSY TEMPORAL ARTERY;  Surgeon: Dana Jolly, MD;  Location: [facility];  Service: General;  Laterality: Right;  Right temporal artery biopsy  . BACK SURGERY  12/31/2009   "for flat back syndrome; fused bottom of my back"  . BI-VENTRICULAR IMPLANTABLE CARDIOVERTER DEFIBRILLATOR Left 01/20/2011   Procedure: BI-VENTRICULAR IMPLANTABLE CARDIOVERTER DEFIBRILLATOR  (CRT-D);  Surgeon: Dana Lance, MD;  Location[facility];  Service: Cardiovascular;  Laterality: Left;  . BIV ICD GENERATOR CHANGEOUT N/A 11/24/2016   Procedure: BIV ICD GENERATOR CHANGEOUT;  Surgeon: Dana Lance, MD;  Location: [facility] [place] CV LAB;  Service: Cardiovascular;  Laterality: N/A;  . BREAST LUMPECTOMY WITH RADIOACTIVE SEED LOCALIZATION Left 01/22/2020   Procedure: LEFT BREAST LUMPECTOMY WITH RADIOACTIVE SEED LOCALIZATION;  Surgeon: Dana Mesa, MD;  Location: [facility];  Service: General;  Laterality: Left;  LMA  . CHOLECYSTECTOMY  ~1996  . FOOT NEUROMA SURGERY  ~ 2003   right  . RE-EXCISION OF BREAST LUMPECTOMY Left 03/03/2020   Procedure: RE-EXCISION MARGINS OF LEFT BREAST LUMPECTOMY;  Surgeon: Dana Mesa, MD;  Location: [place];  Service: General;  Laterality: Left;  . ROTATOR CUFF REPAIR  ~ 2009   left  . SHOULDER ARTHROSCOPY W/ ROTATOR CUFF REPAIR  09/21/2011  . SKIN CANCER EXCISION     nose, forehead, left leg  . TEE WITHOUT CARDIOVERSION N/A 03/01/2017   Procedure: TRANSESOPHAGEAL ECHOCARDIOGRAM (TEE);  Surgeon: Dana Latch, MD;  Location: [facility];  Service: Cardiovascular;  Laterality: N/A;   . THYROID LOBECTOMY  ~ 2006   right  . TUBAL LIGATION  ~ 1983  . VAGINAL HYSTERECTOMY  ~ 2009  . VESICOVAGINAL FISTULA CLOSURE W/ TAH       FAMILY HISTORY:  Family History  Problem Relation Age of Onset  . Heart disease Mother   . Heart failure Mother 80       CHF  . Stroke Mother   . Hypertension Mother   . Heart disease Father 82  . Macular degeneration Father   . Heart disease Sister   . Atrial fibrillation Sister   . Heart disease Sister      SOCIAL HISTORY:  reports that she has never smoked. She has never used smokeless tobacco. She reports that she does not drink alcohol and does not use drugs. The patient is married and lives in [place]. She is a retired Automotive engineer.   ALLERGIES: Other, Tape, Codeine, Fentanyl, Morphine and related,  and Robaxin [methocarbamol]   MEDICATIONS:  Current Outpatient Medications  Medication Sig Dispense Refill  . amitriptyline (ELAVIL) 25 MG tablet Take 44m by mouth every night  5  . CALCIUM PO Take 2 tablets by mouth daily.    . Carboxymethylcellul-Glycerin (LUBRICATING EYE DROPS OP) Place 1 drop into both eyes 2 (two) times daily.     . Cyanocobalamin 1000 MCG/ML KIT Inject 1,000 mcg every 30 (thirty) days as directed.    . cyclobenzaprine (FLEXERIL) 10 MG tablet 1 tablet    . eletriptan (RELPAX) 40 MG tablet Take 40 mg by mouth daily as needed for migraine. May repeat in 2 hours if necessary    . ELIQUIS 5 MG TABS tablet TAKE 1 TABLET BY MOUTH TWICE A DAY 180 tablet 1  . gabapentin (NEURONTIN) 600 MG tablet Take 600 mg by mouth 2 (two) times daily.    .Marland Kitchenlevothyroxine (SYNTHROID, LEVOTHROID) 100 MCG tablet Take 100 mcg daily before breakfast by mouth.    . Melatonin 3 MG TABS Take 9 mg by mouth at bedtime.     . Menthol (BIOFREEZE) 10 % AERO     . montelukast (SINGULAIR) 10 MG tablet Take 10 mg by mouth daily.    . Omega-3 Fatty Acids (FISH OIL) 1000 MG CAPS 1 capsule    . polyethylene glycol (MIRALAX / GLYCOLAX)  packet Take 17 g every evening by mouth.    . propranolol (INDERAL) 60 MG tablet Take 60 mg by mouth daily.    .Marland Kitchenspironolactone (ALDACTONE) 25 MG tablet TAKE 1/2 TABLET BY MOUTH TWICE A DAY 90 tablet 3  . VITAMIN D PO Take 2 capsules by mouth daily.     No current facility-administered medications for this encounter.     REVIEW OF SYSTEMS: On review of systems, the patient reports that she is doing well overall.  She denies any concerns other than some soreness of her breast since her most recent surgery.  PHYSICAL EXAM:  Wt Readings from Last 3 Encounters:  03/03/20 164 lb 10.9 oz (74.7 kg)  02/03/20 163 lb (73.9 kg)  01/29/20 162 lb (73.5 kg)   Temp Readings from Last 3 Encounters:  03/03/20 (P) 97.7 F (36.5 C)  02/03/20 (!) 97.5 F (36.4 C) (Temporal)  01/22/20 (!) 97 F (36.1 C)   BP Readings from Last 3 Encounters:  03/03/20 (!) 92/58  02/03/20 (!) 116/58  01/29/20 96/64   Pulse Readings from Last 3 Encounters:  03/03/20 (!) 54  02/03/20 61  01/29/20 (!) 58    In general this is a well appearing Caucasian female in no acute distress. She's alert and oriented x4 and appropriate throughout the examination. Cardiopulmonary assessment is negative for acute distress and she exhibits normal effort. Bilateral breast exam is deferred.    ECOG = 0  0 - Asymptomatic (Fully active, able to carry on all predisease activities without restriction)  1 - Symptomatic but completely ambulatory (Restricted in physically strenuous activity but ambulatory and able to carry out work of a light or sedentary nature. For example, light housework, office work)  2 - Symptomatic, <50% in bed during the day (Ambulatory and capable of all self care but unable to carry out any work activities. Up and about more than 50% of waking hours)  3 - Symptomatic, >50% in bed, but not bedbound (Capable of only limited self-care, confined to bed or chair 50% or more of waking hours)  4 - Bedbound  (Completely disabled. Cannot carry on any  self-care. Totally confined to bed or chair)  5 - Death   Eustace Pen MM, Creech RH, Tormey DC, et al. [phone]). "Toxicity and response criteria of the [facility]". [place] Oncol. 5 (6): 649-55    LABORATORY DATA:  Lab Results  Component Value Date   WBC 11.9 (H) 02/27/2017   HGB 13.5 02/27/2017   HCT 41.4 02/27/2017   MCV 95.2 02/27/2017   PLT 306 02/27/2017   Lab Results  Component Value Date   NA 138 02/28/2020   K 4.4 02/28/2020   CL 103 02/28/2020   CO2 26 02/28/2020   Lab Results  Component Value Date   ALT 30 02/27/2017   AST 30 02/27/2017   ALKPHOS 71 02/27/2017   BILITOT 0.5 02/27/2017      RADIOGRAPHY: CUP PACEART REMOTE DEVICE CHECK  Result Date: 02/25/2020 Scheduled remote reviewed. Normal device function.  Known chronic "high" RV thresholds. Next remote 91 days- JBox, RN/CVRS      IMPRESSION/PLAN: 1. 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  Dr. Lisbeth Renshaw discusses the final pathology findings and reviews the nature of early stage left breast disease. While Dr. Lisbeth Renshaw would prefer to proceed with radiotherapy, it is not felt that removing her ICD is a good option. Rather, parameters for using a magnet over the device and close post treatment monitoring have been recommended. Dr. Lisbeth Renshaw discusses the delivery and logistics of radiotherapy and anticipates a course of 4 weeks of radiotherapy. Written consent is obtained and placed in the chart, a copy was provided to the patient. She will simulate this morning.  2. In situ ICD.  We reached out to Dr. Lovena Le and he favored using the magnet to turn off her device during treatment, and monitoring following treatment. We're in the midst of contacting the medtr[employer] rep to coordinate for this.  In a visit lasting 45 minutes, greater than 50% of the time was spent face to face reviewing her case, as well as in preparation of, discussing, and coordinating the  patient's care.  The above documentation reflects my direct findings during this shared patient visit. Please see the separate note by Dr. Lisbeth Renshaw on this date for the remainder of the patient's plan of care.    Carola Rhine, PAC

## 2020-03-27 ENCOUNTER — Ambulatory Visit
Admission: RE | Admit: 2020-03-27 | Discharge: 2020-03-27 | Disposition: A | Discharge status: Home / Self Care | Payer: Medicare PPO | Source: Ambulatory Visit | Attending: Radiation Oncology | Admitting: Radiation Oncology

## 2020-03-27 DIAGNOSIS — Z510 Encounter for antineoplastic radiation therapy: Secondary | ICD-10-CM | POA: Insufficient documentation

## 2020-03-27 DIAGNOSIS — D0512 Intraductal carcinoma in situ of left breast: Secondary | ICD-10-CM | POA: Diagnosis not present

## 2020-03-28 DIAGNOSIS — R300 Dysuria: Secondary | ICD-10-CM | POA: Diagnosis not present

## 2020-03-28 DIAGNOSIS — N3000 Acute cystitis without hematuria: Secondary | ICD-10-CM | POA: Diagnosis not present

## 2020-03-31 ENCOUNTER — Encounter: Payer: Self-pay | Admitting: *Deleted

## 2020-04-01 ENCOUNTER — Telehealth: Payer: Self-pay | Admitting: Hematology and Oncology

## 2020-04-01 NOTE — Telephone Encounter (Signed)
Scheduled appt per 3/22 sch msg. Called pt, no answer. Left msg with appt date and time.

## 2020-04-02 DIAGNOSIS — D0512 Intraductal carcinoma in situ of left breast: Secondary | ICD-10-CM | POA: Diagnosis not present

## 2020-04-02 DIAGNOSIS — Z510 Encounter for antineoplastic radiation therapy: Secondary | ICD-10-CM | POA: Diagnosis not present

## 2020-04-06 ENCOUNTER — Ambulatory Visit
Admission: RE | Admit: 2020-04-06 | Discharge: 2020-04-06 | Disposition: A | Discharge status: Home / Self Care | Payer: Medicare PPO | Source: Ambulatory Visit | Attending: Radiation Oncology | Admitting: Radiation Oncology

## 2020-04-06 ENCOUNTER — Other Ambulatory Visit: Payer: Self-pay

## 2020-04-06 DIAGNOSIS — D0512 Intraductal carcinoma in situ of left breast: Secondary | ICD-10-CM | POA: Diagnosis not present

## 2020-04-06 DIAGNOSIS — Z510 Encounter for antineoplastic radiation therapy: Secondary | ICD-10-CM | POA: Diagnosis not present

## 2020-04-07 ENCOUNTER — Ambulatory Visit
Admission: RE | Admit: 2020-04-07 | Discharge: 2020-04-07 | Disposition: A | Discharge status: Home / Self Care | Payer: Medicare PPO | Source: Ambulatory Visit | Attending: Radiation Oncology | Admitting: Radiation Oncology

## 2020-04-07 DIAGNOSIS — E538 Deficiency of other specified B group vitamins: Secondary | ICD-10-CM | POA: Diagnosis not present

## 2020-04-07 DIAGNOSIS — D0512 Intraductal carcinoma in situ of left breast: Secondary | ICD-10-CM | POA: Diagnosis not present

## 2020-04-07 DIAGNOSIS — Z510 Encounter for antineoplastic radiation therapy: Secondary | ICD-10-CM | POA: Diagnosis not present

## 2020-04-08 ENCOUNTER — Other Ambulatory Visit: Payer: Self-pay

## 2020-04-08 ENCOUNTER — Telehealth: Payer: Self-pay | Admitting: Internal Medicine

## 2020-04-08 ENCOUNTER — Telehealth: Payer: Self-pay

## 2020-04-08 ENCOUNTER — Ambulatory Visit
Admission: RE | Admit: 2020-04-08 | Discharge: 2020-04-08 | Disposition: A | Discharge status: Home / Self Care | Payer: Medicare PPO | Source: Ambulatory Visit | Attending: Radiation Oncology | Admitting: Radiation Oncology

## 2020-04-08 DIAGNOSIS — D0512 Intraductal carcinoma in situ of left breast: Secondary | ICD-10-CM | POA: Diagnosis not present

## 2020-04-08 DIAGNOSIS — Z510 Encounter for antineoplastic radiation therapy: Secondary | ICD-10-CM | POA: Diagnosis not present

## 2020-04-08 NOTE — Telephone Encounter (Signed)
Patient called back and was adamant about sending transmissions daily about radiation per a letter she has from Dr. Lovena [name]. Although not seen in the chart which patient is aware she is still going to send them daily.

## 2020-04-08 NOTE — Telephone Encounter (Signed)
Patient called and said that she wanted to talk with Dr. Lovena Le or his nurse about her pacemaker

## 2020-04-08 NOTE — Telephone Encounter (Signed)
Patient received a letter about monitoring her device after each radiation treatment. No letter was found in her chart. Patient states the letter is at her house, she will look at it and contact who sent the letter about what is needed. Advised to call back with further questions or concerns.

## 2020-04-08 NOTE — Telephone Encounter (Signed)
Called to advise paper we will need to see a copy of the paper to be able to advise. States she will try to fax the letter later today. Fax number provided to patient.

## 2020-04-09 ENCOUNTER — Ambulatory Visit
Admission: RE | Admit: 2020-04-09 | Discharge: 2020-04-09 | Disposition: A | Discharge status: Home / Self Care | Payer: Medicare PPO | Source: Ambulatory Visit | Attending: Radiation Oncology | Admitting: Radiation Oncology

## 2020-04-09 ENCOUNTER — Other Ambulatory Visit: Payer: Self-pay

## 2020-04-09 DIAGNOSIS — D0512 Intraductal carcinoma in situ of left breast: Secondary | ICD-10-CM | POA: Diagnosis not present

## 2020-04-09 DIAGNOSIS — Z510 Encounter for antineoplastic radiation therapy: Secondary | ICD-10-CM | POA: Diagnosis not present

## 2020-04-09 MED ORDER — ALRA NON-METALLIC DEODORANT (RAD-ONC)
1.0000 "application " | Freq: Once | TOPICAL | Status: AC
  Administered 2020-04-09: 1 via TOPICAL

## 2020-04-09 MED ORDER — RADIAPLEXRX EX GEL: Freq: Two times a day (BID) | CUTANEOUS | Status: DC

## 2020-04-09 NOTE — Progress Notes (Signed)

## 2020-04-10 ENCOUNTER — Other Ambulatory Visit: Payer: Self-pay

## 2020-04-10 ENCOUNTER — Ambulatory Visit: Payer: Medicare PPO | Admitting: Cardiology

## 2020-04-10 ENCOUNTER — Ambulatory Visit
Admission: RE | Admit: 2020-04-10 | Discharge: 2020-04-10 | Disposition: A | Discharge status: Home / Self Care | Payer: Medicare PPO | Source: Ambulatory Visit | Attending: Radiation Oncology | Admitting: Radiation Oncology

## 2020-04-10 VITALS — BP 92/58 | HR 58 | Ht 67.0 in | Wt 152.2 lb

## 2020-04-10 DIAGNOSIS — Z950 Presence of cardiac pacemaker: Secondary | ICD-10-CM

## 2020-04-10 DIAGNOSIS — D0512 Intraductal carcinoma in situ of left breast: Secondary | ICD-10-CM | POA: Insufficient documentation

## 2020-04-10 DIAGNOSIS — I959 Hypotension, unspecified: Secondary | ICD-10-CM

## 2020-04-10 DIAGNOSIS — Z7901 Long term (current) use of anticoagulants: Secondary | ICD-10-CM

## 2020-04-10 DIAGNOSIS — I480 Paroxysmal atrial fibrillation: Secondary | ICD-10-CM

## 2020-04-10 DIAGNOSIS — I447 Left bundle-branch block, unspecified: Secondary | ICD-10-CM

## 2020-04-10 DIAGNOSIS — I428 Other cardiomyopathies: Secondary | ICD-10-CM

## 2020-04-10 MED ORDER — SPIRONOLACTONE 25 MG PO TABS: 12.5000 mg | ORAL_TABLET | Freq: Every day | ORAL | 3 refills | Status: DC

## 2020-04-10 NOTE — Patient Instructions (Addendum)
Medication Instructions:  Cut back to 1/2 tab spironolactone once a day in the morning. If you have dizziness/lightheadedness, check your blood pressure. If less than 100 on the top number, stop spironolactone entirely.  *If you need a refill on your cardiac medications before your next appointment, please call your pharmacy*   Lab Work: None   Testing/Procedures: None   Follow-Up: At [facility], you and your health needs are our priority.  As part of our continuing mission to provide you with exceptional heart care, we have created designated Provider Care Teams.  These Care Teams include your primary Cardiologist (physician) and Advanced Practice Providers (APPs -  Physician Assistants and Nurse Practitioners) who all work together to provide you with the care you need, when you need it.  We recommend signing up for the patient portal called "MyChart".  Sign up information is provided on this After Visit Summary.  MyChart is used to connect with patients for Virtual Visits (Telemedicine).  Patients are able to view lab/test results, encounter notes, upcoming appointments, etc.  Non-urgent messages can be sent to your provider as well.   To learn more about what you can do with MyChart, go to [email or web address].    Your next appointment:   3 month(s) @ [address] [place], [place] [address]   The format for your next appointment:   In Person  Provider:   Buford Dresser, MD

## 2020-04-10 NOTE — Progress Notes (Signed)
Cardiology Office Note:    Date:  04/10/2020   ID:  TALISHIA BETZLER, DOB 1955/07/08, MRN [id number]  PCP:  [facility] [place]  Cardiologist:  Buford Dresser, MD  Referring MD: [facility]*   CC: follow up  History of Present Illness:    [name] is a 65 y.o. female with a hx of hx of LBBB, NICM s/p CRT-D-->P with normalization of EF, paroxysmal atrial fibrillation.   Today: Struggling with her cancer treatments, getting radiation now. Feels fatigued but has appetite, feels it might be "too good." Husband has a strong appetite, feels that she is eating too much. Her weight is actually down from prior. BMI 23.8 today. She reports it is running closer to 162 lbs. Tries to walk 1-2 miles/day with her dog.   No BP checks at home or at radiation. No symptoms. Has ranged in this 25K-539J systolic in the past. Has mild LE edema at the end of the day, wears compression stockings. Has SOB with walking up hills or pushing herself. On propranolol for tremor. Takes 1/2 tab spironolactone BID. Discussed cutting this back today.  Denies chest pain, shortness of breath at rest or with normal exertion. No PND, orthopnea, change in LE edema or unexpected weight gain. No syncope or palpitations.  Past Medical History:  Diagnosis Date   AF (paroxysmal atrial fibrillation) ([place])    Arthritis    Basal cell carcinoma 11/11/1998   left forehead-tx dr Tonia Brooms   [facility] (basal cell carcinoma) 03/11/2003   left distal lower leg ankle-tx dr Tonia Brooms   [facility] (basal cell carcinoma) 11/19/2009   left anterior lat distal lower leg   Biventricular cardiac pacemaker in situ    a. prior CRT-D in 2013 with downgrade to CRT-Pacemaker only in 11/2016.   Breast cancer ([place]) 67/34/1937   Chronic systolic CHF (congestive heart failure) (HCC)    Complication of anesthesia    aspiration   Essential tremor    Fibroids    GERD (gastroesophageal reflux disease)    otc   Hypertension     Hyperthyroidism    Hypothyroidism    LBBB (left bundle branch block)    conduction disorder   Migraines    Mild aortic insufficiency    NICM (nonischemic cardiomyopathy) ([place])    a. 2012: normal coronaries, EF 25-30%. b. improved to 55% 11/2016.   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   right dorsal lateral upper nose-mohs    Past Surgical History:  Procedure Laterality Date   ARTERY BIOPSY  12/29/2011   Procedure: MINOR BIOPSY TEMPORAL ARTERY;  Surgeon: Edward Jolly, MD;  Location: [place];  Service: General;  Laterality: Right;  Right temporal artery biopsy   BACK SURGERY  12/31/2009   "for flat back syndrome; fused bottom of my back"   BI-VENTRICULAR IMPLANTABLE CARDIOVERTER DEFIBRILLATOR Left 01/20/2011   Procedure: BI-VENTRICULAR IMPLANTABLE CARDIOVERTER DEFIBRILLATOR  (CRT-D);  Surgeon: Evans Lance, MD;  Location: [facility] CATH LAB;  Service: Cardiovascular;  Laterality: Left;  BIV ICD GENERATOR CHANGEOUT N/A 11/24/2016   Procedure: BIV ICD GENERATOR CHANGEOUT;  Surgeon: Evans Lance, MD;  Location: [place] [facility] [place] CV LAB;  Service: Cardiovascular;  Laterality: N/A;   BREAST LUMPECTOMY WITH RADIOACTIVE SEED LOCALIZATION Left 01/22/2020   Procedure: LEFT BREAST LUMPECTOMY WITH RADIOACTIVE SEED LOCALIZATION;  Surgeon: Donnie Mesa, MD;  Location: [place];  Service: General;  Laterality: Left;  LMA   CHOLECYSTECTOMY  ~1996   FOOT NEUROMA SURGERY  ~ 2003   right   RE-EXCISION OF BREAST LUMPECTOMY Left 03/03/2020   Procedure: RE-EXCISION MARGINS OF LEFT  BREAST LUMPECTOMY;  Surgeon: Donnie Mesa, MD;  Location: [place];  Service: General;  Laterality: Left;   ROTATOR CUFF REPAIR  ~ 2009   left   SHOULDER ARTHROSCOPY W/ ROTATOR CUFF REPAIR  09/21/2011   SKIN CANCER EXCISION     nose, forehead, left leg   TEE WITHOUT CARDIOVERSION N/A 03/01/2017   Procedure: TRANSESOPHAGEAL ECHOCARDIOGRAM (TEE);  Surgeon: Skeet Latch, MD;  Location: [place];  Service: Cardiovascular;  Laterality: N/A;   THYROID LOBECTOMY  ~ 2006   right   TUBAL LIGATION  ~ [place]  ~ 2009   VESICOVAGINAL FISTULA CLOSURE W/ TAH      Current Medications: Current Outpatient Medications on File Prior to Visit  Medication Sig   amitriptyline (ELAVIL) 25 MG tablet Take 40m by mouth every night   CALCIUM PO Take 2 tablets by mouth daily.   Carboxymethylcellul-Glycerin (LUBRICATING EYE DROPS OP) Place 1 drop into both eyes 2 (two) times daily.    Cyanocobalamin 1000 MCG/ML KIT Inject 1,000 mcg every 30 (thirty) days as directed.   cyclobenzaprine (FLEXERIL) 10 MG tablet 1 tablet   eletriptan (RELPAX) 40 MG tablet Take 40 mg by mouth daily as needed for migraine. May repeat in 2 hours if necessary   ELIQUIS 5 MG TABS tablet TAKE 1 TABLET BY MOUTH TWICE A DAY   gabapentin (NEURONTIN) 600 MG tablet Take 600 mg by mouth 2 (two) times daily.   levothyroxine (SYNTHROID, LEVOTHROID) 100 MCG tablet Take 100 mcg daily before breakfast by mouth.   Melatonin 5 MG CAPS Take 5 mg by mouth at bedtime.   Menthol (BIOFREEZE) 10 % AERO    montelukast (SINGULAIR) 10 MG tablet Take 10 mg by mouth daily.   Omega-3 Fatty Acids (FISH OIL) 1000 MG CAPS 1 capsule   polyethylene glycol (MIRALAX / GLYCOLAX) packet Take 17 g every evening by mouth.   propranolol (INDERAL) 60 MG tablet Take 60 mg by mouth daily.   spironolactone (ALDACTONE) 25 MG tablet TAKE 1/2 TABLET BY MOUTH TWICE A DAY   VITAMIN D PO Take 2 capsules by mouth daily.   No current  facility-administered medications on file prior to visit.     Allergies:   Other, Tape, Codeine, Fentanyl, Morphine and related, and Robaxin [methocarbamol]   Social History   Tobacco Use   Smoking status: Never Smoker   Smokeless tobacco: Never Used  Vaping Use   Vaping Use: Never used  Substance Use Topics   Alcohol use: No   Drug use: No    Family History: family history includes Atrial fibrillation in her sister; Heart disease in her mother, sister, and sister; Heart disease (age of onset: 984 in her father; Heart failure (age of onset: 816 in her mother; Hypertension in her mother; Macular degeneration in her father; Stroke in her mother.  ROS:   Please see the history  of present illness.  Additional pertinent ROS otherwise unremarkable.  EKGs/Labs/Other Studies Reviewed:    The following studies were reviewed today: Echo 08/14/19 1. Left ventricular ejection fraction, by estimation, is 50 to 55%. The  left ventricle has low normal function. The left ventricle has no regional  wall motion abnormalities. Left ventricular diastolic parameters are  consistent with Grade I diastolic  dysfunction (impaired relaxation). The average left ventricular global  longitudinal strain is -25.1 %.   2. Right ventricular systolic function is normal. The right ventricular  size is normal. There is normal pulmonary artery systolic pressure.   3. The mitral valve is normal in structure. No evidence of mitral valve  regurgitation. No evidence of mitral stenosis.   4. The aortic valve is normal in structure. Aortic valve regurgitation is  mild. No aortic stenosis is present.   5. Aortic root/ascending aorta has been repaired/replaced. 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   Comparison(s): 02/26/17 EF 60-65%. PA pressure 68mHg.   TEE 02/2017 - Left ventricle: Systolic function was normal. Wall motion was   normal; there were no regional wall motion  abnormalities. - Aortic valve: There was mild regurgitation. - Mitral valve: There was trivial regurgitation. - Left atrium: No evidence of thrombus in the atrial cavity or   appendage. No evidence of thrombus in the atrial cavity or   appendage. - Right ventricle: The cavity size was normal. Wall thickness was   normal. Systolic function was normal. - Right atrium: No evidence of thrombus in the atrial cavity or   appendage. - Atrial septum: A patent foramen ovale is likely. There was no   right to left flow at rest but saline microcavitation study was   positive with abdominal pressure. The test was repeated to ensure   that there was no extracardiac shunt and was negative. - Tricuspid valve: There was trivial regurgitation.   TTE 02/2017 Study Conclusions   - Left ventricle: The cavity size was normal. Wall thickness was   normal. Systolic function was normal. The estimated ejection   fraction was in the range of 60% to 65%. Wall motion was normal;   there were no regional wall motion abnormalities. Doppler   parameters are consistent with abnormal left ventricular   relaxation (grade 1 diastolic dysfunction). The E/e&' ratio is <8,   suggesting normal LV filling pressure. - Aortic valve: Sclerosis without stenosis. There was trivial   regurgitation. - Left atrium: The atrium was normal in size. - Tricuspid valve: There was trivial regurgitation. - Pulmonary arteries: PA peak pressure: 30 mm Hg (S). - Inferior vena cava: The vessel was normal in size. The   respirophasic diameter changes were in the normal range (>= 50%),   consistent with normal central venous pressure.   Impressions:   - Compared to a prior study in 2018, the LVEF is higher at 60-65%   and there is now grade 1 DD with normal LV filling pressure.  EKG:  EKG is personally reviewed.  The ekg ordered 08/01/19 demonstrates atrial-sensed, ventricular-paced rhythm at 73 bpm  Recent Labs: 02/28/2020: BUN 18;  Creatinine, Ser 0.93; Potassium 4.4; Sodium 138  Recent Lipid Panel    Component Value Date/Time   CHOL  02/11/2010 0555    103        ATP III CLASSIFICATION:  <200     mg/dL   Desirable  200-239  mg/dL   Borderline High  >=240    mg/dL  High          TRIG 85 02/11/2010 0555   HDL 42 02/11/2010 0555   CHOLHDL 2.5 02/11/2010 0555   VLDL 17 02/11/2010 0555   LDLCALC  02/11/2010 0555    44        Total Cholesterol/HDL:CHD Risk Coronary Heart Disease Risk Table                     Men   Women  1/2 Average Risk   3.4   3.3  Average Risk       5.0   4.4  2 X Average Risk   9.6   7.1  3 X Average Risk  23.4   11.0        Use the calculated Patient Ratio above and the CHD Risk Table to determine the patient's CHD Risk.        ATP III CLASSIFICATION (LDL):  <100     mg/dL   Optimal  100-129  mg/dL   Near or Above                    Optimal  130-159  mg/dL   Borderline  160-189  mg/dL   High  >190     mg/dL   Very High    Physical Exam:    VS:  BP (!) 92/58   Pulse (!) 58   Ht '5\' 7"'  (1.702 m)   Wt 152 lb 3.2 oz (69 kg)   SpO2 97%   BMI 23.84 kg/m     Wt Readings from Last 3 Encounters:  04/10/20 152 lb 3.2 oz (69 kg)  03/25/20 163 lb 6 oz (74.1 kg)  03/03/20 164 lb 10.9 oz (74.7 kg)    GEN: Well nourished, well developed in no acute distress HEENT: Normal, moist mucous membranes NECK: No JVD CARDIAC: regular rhythm, normal S1 and S2, no rubs or gallops. No murmur. VASCULAR: Radial and DP pulses 2+ bilaterally. No carotid bruits RESPIRATORY:  Clear to auscultation without rales, wheezing or rhonchi  ABDOMEN: Soft, non-tender, non-distended MUSCULOSKELETAL:  Ambulates independently SKIN: Warm and dry, trivial bilateral LE edema NEUROLOGIC:  Alert and oriented x 3. No focal neuro deficits noted. PSYCHIATRIC:  Normal affect    ASSESSMENT:    1. Nonischemic cardiomyopathy ([place])   2. Left bundle branch block   3. AF (paroxysmal atrial fibrillation) ([place])   4.  Long term current use of anticoagulant therapy   5. Biventricular cardiac pacemaker in situ   6. Hypotension, unspecified hypotension type    PLAN:    Nonischemic cardiomyopathy, LBBB: EF normalized s/p CRT, follows with Dr. Lovena Le -symptoms improved, now NYHA class II -continue spironolactone, thought decreasing dose today as below -on propranolol 60 mg daily. We have discussed cutting this back in the past due to low BP, but it helps her tremor, and she would like to continue if possible -echo as above  Bilateral LE edema: -reports improvement with elevation, compression stockings -echo as above   Hypertension/hypotension history:  -now running low/hypotensive, unclear if she is having symptoms from BP or cancer treatments -will cut back spironolactone, monitor symptoms. 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  -It was never fully determined what caused her splenic infarction, as she had a possible PFO on TEE but also had only very brief  afib seen on her device. Dr. Wynonia Lawman previously started apixaban, and she is tolerating this without issue. She prefers to be on anticoagulation vs. Risking another embolic event given the uncertain cause.  Cardiac risk counseling and prevention recommendations: -recommend heart healthy/Mediterranean diet, with whole grains, fruits, vegetable, fish, lean meats, nuts, and olive oil. Limit salt. -recommend moderate walking, 3-5 times/week for 30-50 minutes each session. Aim for at least 150 minutes.week. Goal should be pace of 3 miles/hours, or walking 1.5 miles in 30 minutes -recommend avoidance of tobacco products. Avoid excess alcohol.  Plan for follow up: 3 mos or sooner as needed  Buford Dresser, MD, PhD [place]  [facility]   Medication Adjustments/Labs and Tests  Ordered: Current medicines are reviewed at length with the patient today.  Concerns regarding medicines are outlined above.  No orders of the defined types were placed in this encounter.  Meds ordered this encounter  Medications   spironolactone (ALDACTONE) 25 MG tablet    Sig: Take 0.5 tablets (12.5 mg total) by mouth daily.    Dispense:  45 tablet    Refill:  3    Patient Instructions  Medication Instructions:  Cut back to 1/2 tab spironolactone once a day in the morning. If you have dizziness/lightheadedness, check your blood pressure. If less than 100 on the top number, stop spironolactone entirely.  *If you need a refill on your cardiac medications before your next appointment, please call your pharmacy*   Lab Work: None   Testing/Procedures: None   Follow-Up: At [facility], you and your health needs are our priority.  As part of our continuing mission to provide you with exceptional heart care, we have created designated Provider Care Teams.  These Care Teams include your primary Cardiologist (physician) and Advanced Practice Providers (APPs -  Physician Assistants and Nurse Practitioners) who all work together to provide you with the care you need, when you need it.  We recommend signing up for the patient portal called "MyChart".  Sign up information is provided on this After Visit Summary.  MyChart is used to connect with patients for Virtual Visits (Telemedicine).  Patients are able to view lab/test results, encounter notes, upcoming appointments, etc.  Non-urgent messages can be sent to your provider as well.   To learn more about what you can do with MyChart, go to [email or web address].    Your next appointment:   3 month(s) @ [address] [place], [place] [id number]   The format for your next appointment:   In Person  Provider:   Buford Dresser, MD    Signed, Buford Dresser, MD PhD 04/10/2020  [place]

## 2020-04-13 ENCOUNTER — Other Ambulatory Visit: Payer: Self-pay

## 2020-04-13 ENCOUNTER — Ambulatory Visit
Admission: RE | Admit: 2020-04-13 | Discharge: 2020-04-13 | Disposition: A | Discharge status: Home / Self Care | Payer: Medicare PPO | Source: Ambulatory Visit | Attending: Radiation Oncology | Admitting: Radiation Oncology

## 2020-04-13 DIAGNOSIS — D0512 Intraductal carcinoma in situ of left breast: Secondary | ICD-10-CM | POA: Diagnosis not present

## 2020-04-14 ENCOUNTER — Ambulatory Visit
Admission: RE | Admit: 2020-04-14 | Discharge: 2020-04-14 | Disposition: A | Discharge status: Home / Self Care | Payer: Medicare PPO | Source: Ambulatory Visit | Attending: Radiation Oncology | Admitting: Radiation Oncology

## 2020-04-14 DIAGNOSIS — D0512 Intraductal carcinoma in situ of left breast: Secondary | ICD-10-CM | POA: Diagnosis not present

## 2020-04-15 ENCOUNTER — Ambulatory Visit
Admission: RE | Admit: 2020-04-15 | Discharge: 2020-04-15 | Disposition: A | Discharge status: Home / Self Care | Payer: Medicare PPO | Source: Ambulatory Visit | Attending: Radiation Oncology | Admitting: Radiation Oncology

## 2020-04-15 DIAGNOSIS — D0512 Intraductal carcinoma in situ of left breast: Secondary | ICD-10-CM | POA: Diagnosis not present

## 2020-04-16 ENCOUNTER — Ambulatory Visit
Admission: RE | Admit: 2020-04-16 | Discharge: 2020-04-16 | Disposition: A | Discharge status: Home / Self Care | Payer: Medicare PPO | Source: Ambulatory Visit | Attending: Radiation Oncology | Admitting: Radiation Oncology

## 2020-04-16 ENCOUNTER — Other Ambulatory Visit: Payer: Self-pay

## 2020-04-16 DIAGNOSIS — D0512 Intraductal carcinoma in situ of left breast: Secondary | ICD-10-CM | POA: Diagnosis not present

## 2020-04-17 ENCOUNTER — Ambulatory Visit
Admission: RE | Admit: 2020-04-17 | Discharge: 2020-04-17 | Disposition: A | Discharge status: Home / Self Care | Payer: Medicare PPO | Source: Ambulatory Visit | Attending: Radiation Oncology | Admitting: Radiation Oncology

## 2020-04-17 DIAGNOSIS — D0512 Intraductal carcinoma in situ of left breast: Secondary | ICD-10-CM | POA: Diagnosis not present

## 2020-04-20 ENCOUNTER — Other Ambulatory Visit: Payer: Self-pay

## 2020-04-20 ENCOUNTER — Ambulatory Visit
Admission: RE | Admit: 2020-04-20 | Discharge: 2020-04-20 | Disposition: A | Discharge status: Home / Self Care | Payer: Medicare PPO | Source: Ambulatory Visit | Attending: Radiation Oncology | Admitting: Radiation Oncology

## 2020-04-20 DIAGNOSIS — D0512 Intraductal carcinoma in situ of left breast: Secondary | ICD-10-CM | POA: Diagnosis not present

## 2020-04-21 ENCOUNTER — Ambulatory Visit
Admission: RE | Admit: 2020-04-21 | Discharge: 2020-04-21 | Disposition: A | Discharge status: Home / Self Care | Payer: Medicare PPO | Source: Ambulatory Visit | Attending: Radiation Oncology | Admitting: Radiation Oncology

## 2020-04-21 DIAGNOSIS — D0512 Intraductal carcinoma in situ of left breast: Secondary | ICD-10-CM | POA: Diagnosis not present

## 2020-04-22 ENCOUNTER — Other Ambulatory Visit: Payer: Self-pay

## 2020-04-22 ENCOUNTER — Ambulatory Visit
Admission: RE | Admit: 2020-04-22 | Discharge: 2020-04-22 | Disposition: A | Discharge status: Home / Self Care | Payer: Medicare PPO | Source: Ambulatory Visit | Attending: Radiation Oncology | Admitting: Radiation Oncology

## 2020-04-22 DIAGNOSIS — D0512 Intraductal carcinoma in situ of left breast: Secondary | ICD-10-CM | POA: Diagnosis not present

## 2020-04-23 ENCOUNTER — Ambulatory Visit
Admission: RE | Admit: 2020-04-23 | Discharge: 2020-04-23 | Disposition: A | Discharge status: Home / Self Care | Payer: Medicare PPO | Source: Ambulatory Visit | Attending: Radiation Oncology | Admitting: Radiation Oncology

## 2020-04-23 DIAGNOSIS — D0512 Intraductal carcinoma in situ of left breast: Secondary | ICD-10-CM | POA: Diagnosis not present

## 2020-04-24 ENCOUNTER — Ambulatory Visit: Payer: Medicare PPO | Admitting: Radiation Oncology

## 2020-04-24 ENCOUNTER — Other Ambulatory Visit: Payer: Self-pay

## 2020-04-24 ENCOUNTER — Ambulatory Visit
Admission: RE | Admit: 2020-04-24 | Discharge: 2020-04-24 | Disposition: A | Discharge status: Home / Self Care | Payer: Medicare PPO | Source: Ambulatory Visit | Attending: Radiation Oncology | Admitting: Radiation Oncology

## 2020-04-24 DIAGNOSIS — D0512 Intraductal carcinoma in situ of left breast: Secondary | ICD-10-CM

## 2020-04-24 MED ORDER — RADIAPLEXRX EX GEL: Freq: Once | CUTANEOUS | Status: AC

## 2020-04-27 ENCOUNTER — Encounter: Payer: Self-pay | Admitting: Radiation Oncology

## 2020-04-27 ENCOUNTER — Other Ambulatory Visit: Payer: Self-pay

## 2020-04-27 ENCOUNTER — Ambulatory Visit
Admission: RE | Admit: 2020-04-27 | Discharge: 2020-04-27 | Disposition: A | Discharge status: Home / Self Care | Payer: Medicare PPO | Source: Ambulatory Visit | Attending: Radiation Oncology | Admitting: Radiation Oncology

## 2020-04-27 DIAGNOSIS — D0512 Intraductal carcinoma in situ of left breast: Secondary | ICD-10-CM | POA: Diagnosis not present

## 2020-04-27 NOTE — Progress Notes (Signed)
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  She developed some irritation underneath the breast last week and was planning to use Neosporin with pain relief and use this diffusely underneath the inframammary fold.  Since doing that on Saturday, the area has been more erythematous and itchy.  She was seen in the clinic, she describes the itchiness to be very intense.  We discussed using Benadryl at nighttime if she needs to sleep and has trouble with managing the itchiness.  In evaluating the skin diffuse erythema and some plaques along the inframammary fold are noted.  I suspect that there is some overlap in irritation from the Neosporin with pain relief as well as early signs of yeast.  While there is not obvious debris from Candida, we discussed the use of Mycostatin topical powder, we had this in the clinic and provided this to the patient to use 3 times daily through the end of this week and then twice a day until the rash is gone.  She is to call us if her symptoms do not improve.  I would avoid Neosporin or neomycin-containing antibacterial ointments moving forward as there can be risks of developing neomycin allergies.  She is in agreement and will keep [place] informed of any questions or concerns the rest of the week.

## 2020-04-28 ENCOUNTER — Encounter: Payer: Self-pay | Admitting: *Deleted

## 2020-04-28 ENCOUNTER — Ambulatory Visit
Admission: RE | Admit: 2020-04-28 | Discharge: 2020-04-28 | Disposition: A | Discharge status: Home / Self Care | Payer: Medicare PPO | Source: Ambulatory Visit | Attending: Radiation Oncology | Admitting: Radiation Oncology

## 2020-04-28 DIAGNOSIS — D0512 Intraductal carcinoma in situ of left breast: Secondary | ICD-10-CM | POA: Diagnosis not present

## 2020-04-29 ENCOUNTER — Ambulatory Visit
Admission: RE | Admit: 2020-04-29 | Discharge: 2020-04-29 | Disposition: A | Discharge status: Home / Self Care | Payer: Medicare PPO | Source: Ambulatory Visit | Attending: Radiation Oncology | Admitting: Radiation Oncology

## 2020-04-29 ENCOUNTER — Other Ambulatory Visit: Payer: Self-pay

## 2020-04-29 DIAGNOSIS — D0512 Intraductal carcinoma in situ of left breast: Secondary | ICD-10-CM | POA: Diagnosis not present

## 2020-04-30 ENCOUNTER — Ambulatory Visit
Admission: RE | Admit: 2020-04-30 | Discharge: 2020-04-30 | Disposition: A | Discharge status: Home / Self Care | Payer: Medicare PPO | Source: Ambulatory Visit | Attending: Radiation Oncology | Admitting: Radiation Oncology

## 2020-04-30 DIAGNOSIS — D0512 Intraductal carcinoma in situ of left breast: Secondary | ICD-10-CM | POA: Diagnosis not present

## 2020-05-01 ENCOUNTER — Other Ambulatory Visit: Payer: Self-pay

## 2020-05-01 ENCOUNTER — Ambulatory Visit: Payer: Medicare PPO | Admitting: Internal Medicine

## 2020-05-01 ENCOUNTER — Ambulatory Visit
Admission: RE | Admit: 2020-05-01 | Discharge: 2020-05-01 | Disposition: A | Discharge status: Home / Self Care | Payer: Medicare PPO | Source: Ambulatory Visit | Attending: Radiation Oncology | Admitting: Radiation Oncology

## 2020-05-01 ENCOUNTER — Encounter: Payer: Self-pay | Admitting: Radiation Oncology

## 2020-05-01 ENCOUNTER — Encounter: Payer: Self-pay | Admitting: Internal Medicine

## 2020-05-01 VITALS — BP 90/60 | HR 56 | Ht 67.0 in | Wt 162.0 lb

## 2020-05-01 DIAGNOSIS — I447 Left bundle-branch block, unspecified: Secondary | ICD-10-CM

## 2020-05-01 DIAGNOSIS — Z950 Presence of cardiac pacemaker: Secondary | ICD-10-CM

## 2020-05-01 DIAGNOSIS — I428 Other cardiomyopathies: Secondary | ICD-10-CM | POA: Diagnosis not present

## 2020-05-01 DIAGNOSIS — D0512 Intraductal carcinoma in situ of left breast: Secondary | ICD-10-CM | POA: Diagnosis not present

## 2020-05-01 NOTE — Progress Notes (Signed)
HPI [name] returns today for followup. She is a pleasant 65 yo woman with a h/o non-ischemic CM, LBBB, s/p BIV PPM insertion. She had her device last placed under the left pectoralis muscle. She has been diagnosed with breast CA and undergone left breast tumor excision by Dr. Georgette Dover. She is completing her chemo/XRT treatment today. She has otherwise done well. Her energy level is down but otherwise well.  Allergies  Allergen Reactions  . Other Nausea And Vomiting and Other (See Comments)    Most narcotic pain meds cause N/V and CHEST PAIN; needs an antiemetic to tolerate  Also, patient was diagnosed with Raynaud's disease and was told to NOT place IV(s) in either hand because of poor circulation  . Tape Other (See Comments)    PATIENT'S SKIN IS VERY THIN; IT TEARS, BRUISES, AND BLEEDS VERY EASILY (PLEASE USE COBAN WRAP, IF POSSIBLE!!)  . Codeine Other (See Comments)    Chest pain  . Fentanyl Other (See Comments)    Bad dreams  . Morphine And Related Nausea And Vomiting  . Robaxin [Methocarbamol] Other (See Comments)    Chest pain     Current Outpatient Medications  Medication Sig Dispense Refill  . amitriptyline (ELAVIL) 25 MG tablet Take 35m by mouth every night  5  . CALCIUM PO Take 2 tablets by mouth daily.    . Carboxymethylcellul-Glycerin (LUBRICATING EYE DROPS OP) Place 1 drop into both eyes 2 (two) times daily.     . Cyanocobalamin 1000 MCG/ML KIT Inject 1,000 mcg every 30 (thirty) days as directed.    . cyclobenzaprine (FLEXERIL) 10 MG tablet 1 tablet    . eletriptan (RELPAX) 40 MG tablet Take 40 mg by mouth daily as needed for migraine. May repeat in 2 hours if necessary    . ELIQUIS 5 MG TABS tablet TAKE 1 TABLET BY MOUTH TWICE A DAY 180 tablet 1  . gabapentin (NEURONTIN) 600 MG tablet Take 600 mg by mouth 2 (two) times daily.    .Marland Kitchenlevothyroxine (SYNTHROID, LEVOTHROID) 100 MCG tablet Take 100 mcg daily before breakfast by mouth.    . Melatonin 5 MG CAPS Take 5  mg by mouth at bedtime.    . Menthol (BIOFREEZE) 10 % AERO     . montelukast (SINGULAIR) 10 MG tablet Take 10 mg by mouth daily.    . Omega-3 Fatty Acids (FISH OIL) 1000 MG CAPS 1 capsule    . polyethylene glycol (MIRALAX / GLYCOLAX) packet Take 17 g every evening by mouth.    . propranolol (INDERAL) 60 MG tablet Take 60 mg by mouth daily.    .Marland Kitchenspironolactone (ALDACTONE) 25 MG tablet Take 0.5 tablets (12.5 mg total) by mouth daily. 45 tablet 3  . VITAMIN D PO Take 2 capsules by mouth daily.     No current facility-administered medications for this visit.     Past Medical History:  Diagnosis Date  . AF (paroxysmal atrial fibrillation) ([place]   . Arthritis   . Basal cell carcinoma 11/11/1998   left forehead-tx dr gTonia Brooms . BCC (basal cell carcinoma) 03/11/2003   left distal lower leg ankle-tx dr gTonia Brooms . BCC (basal cell carcinoma) 11/19/2009   left anterior lat distal lower leg  . Biventricular cardiac pacemaker in situ    a. prior CRT-D in 2013 with downgrade to CRT-Pacemaker only in 11/2016.  . [place]12[place]  . Chronic systolic CHF ([place]estive heart failure) [place]   . Complication of  anesthesia    aspiration  . Essential tremor   . Fibroids   . GERD (gastroesophageal reflux disease)    otc  . Hypertension   . Hyperthyroidism   . Hypothyroidism   . LBBB (left bundle branch block)    conduction disorder  . Migraines   . Mild aortic insufficiency   . NICM (nonischemic cardiomyopathy) ([place])    a. 2012: normal coronaries, EF 25-30%. b. improved to 55% 11/2016.  . Osteoporosis   . Pleural effusion, right    thoracentesis 02/09/09   . PONV (postoperative nausea and vomiting)   . Raynaud disease   . Raynaud's disease   . SCC (squamous cell carcinoma) 07/27/2009   left preauricular-dr gruber  . SCC (squamous cell carcinoma) 08/20/2018   right upper lip-mohs Dr.mitkov  . Scoliosis   . Skin cancer    "squamous cell on nose, forehead, & left leg"  . Squamous  cell carcinoma of skin 07/14/2004   right dorsal lateral upper nose-mohs    ROS:   All systems reviewed and negative except as noted in the HPI.   Past Surgical History:  Procedure Laterality Date  . ARTERY BIOPSY  12/29/2011   Procedure: MINOR BIOPSY TEMPORAL ARTERY;  Surgeon: Edward Jolly, MD;  Location: [place];  Service: General;  Laterality: Right;  Right temporal artery biopsy  . BACK SURGERY  12/31/2009   "for flat back syndrome; fused bottom of my back"  . BI-VENTRICULAR IMPLANTABLE CARDIOVERTER DEFIBRILLATOR Left 01/20/2011   Procedure: BI-VENTRICULAR IMPLANTABLE CARDIOVERTER DEFIBRILLATOR  (CRT-D);  Surgeon: Evans Lance, MD;  Location[facility] C[facility] LAB;  Service: Cardiovascular;  Laterality: Left;  . BIV ICD GENERATOR CHANGEOUT N/A 11/24/2016   Procedure: BIV ICD GENERATOR CHANGEOUT;  Surgeon: Evans Lance, MD;  Location: [place] CV LAB;  Service: Cardiovascular;  Laterality: N/A;  . BREAST LUMPECTOMY WITH RADIOACTIVE SEED LOCALIZATION Left 01/22/2020   Procedure: LEFT BREAST LUMPECTOMY WITH RADIOACTIVE SEED LOCALIZATION;  Surgeon: Donnie Mesa, MD;  Location: [place];  Service: General;  Laterality: Left;  LMA  . CHOLECYSTECTOMY  ~1996  . FOOT NEUROMA SURGERY  ~ 2003   right  . RE-EXCISION OF BREAST LUMPECTOMY Left 03/03/2020   Procedure: RE-EXCISION MARGINS OF LEFT BREAST LUMPECTOMY;  Surgeon: Donnie Mesa, MD;  Location: [place];  Service: General;  Laterality: Left;  . ROTATOR CUFF REPAIR  ~ 2009   left  . SHOULDER ARTHROSCOPY W/ ROTATOR CUFF REPAIR  09/21/2011  . SKIN CANCER EXCISION     nose, forehead, left leg  . TEE WITHOUT CARDIOVERSION N/A 03/01/2017   Procedure: TRANSESOPHAGEAL ECHOCARDIOGRAM (TEE);  Surgeon: Skeet Latch, MD;  Location: [place];  Service: Cardiovascular;  Laterality: N/A;  . THYROID LOBECTOMY  ~ 2006   right  . TUBAL LIGATION  ~ 1983  . VAGINAL HYSTERECTOMY  ~ 2009  .  VESICOVAGINAL FISTULA CLOSURE W/ TAH       Family History  Problem Relation Age of Onset  . Heart disease Mother   . Heart failure Mother 38       CHF  . Stroke Mother   . Hypertension Mother   . Heart disease Father 78  . Macular degeneration Father   . Heart disease Sister   . Atrial fibrillation Sister   . Heart disease Sister      Social History   Socioeconomic History  . Marital status: Married    Spouse name: Ronalee Belts  . Number of children: 1  .  Years of education: MA  . Highest education level: Not on file  Occupational History  . Occupation: Pharmacist, hospital    Comment: n/a  Tobacco Use  . Smoking status: Never Smoker  . Smokeless tobacco: Never Used  Vaping Use  . Vaping Use: Never used  Substance and Sexual Activity  . Alcohol use: No  . Drug use: No  . Sexual activity: Yes  Other Topics Concern  . Not on file  Social History Narrative   Patient lives at home with family.   Caffeine Use: 1-2 cups daily   Social Determinants of Health   Financial Resource Strain: Low Risk   . Difficulty of Paying Living Expenses: Not hard at all  Food Insecurity: No Food Insecurity  . Worried About Charity fundraiser in the Last Year: Never true  . Ran Out of Food in the Last Year: Never true  Transportation Needs: No Transportation Needs  . Lack of Transportation (Medical): No  . Lack of Transportation (Non-Medical): No  Physical Activity: Not on file  Stress: Not on file  Social Connections: Not on file  Intimate Partner Violence: Not on file     BP 90/60 (BP Location: Left Arm, Patient Position: Sitting, Cuff Size: Normal)   Pulse (!) 56   Ht '5\' 7"'  (1.702 m)   Wt 162 lb (73.5 kg)   SpO2 97%   BMI 25.37 kg/m   Physical Exam:  Well appearing NAD HEENT: Unremarkable Neck:  No JVD, no thyromegally Lymphatics:  No adenopathy Back:  No CVA tenderness Lungs:  Clear with no wheezes HEART:  Regular rate rhythm, no murmurs, no rubs, no clicks Abd:  soft, positive  bowel sounds, no organomegally, no rebound, no guarding Ext:  2 plus pulses, no edema, no cyanosis, no clubbing Skin:  No rashes no nodules Neuro:  CN II through XII intact, motor grossly intact  EKG - NSR with ventricular pacing  DEVICE  Normal device function.  See PaceArt for details.   Assess/Plan: 1. Chronic systolic heart failure - her symptoms are well compensated class 2A. She will continue her current meds. 2. Biv PPM - her device has over 6 years of longevity. We will recheck. 3. Breast CA - she is s/p chemo/XRT 4. PAF - she is maintaining NSR very nicely.

## 2020-05-01 NOTE — Patient Instructions (Signed)
Medication Instructions:  Your physician recommends that you continue on your current medications as directed. Please refer to the Current Medication list given to you today.  Labwork: None ordered.  Testing/Procedures: None ordered.  Follow-Up: Your physician wants you to follow-up in: one year with Gregg Taylor, MD or one of the following Advanced Practice Providers on your designated Care Team:    Amber Seiler, NP  Renee Ursuy, PA-C  Michael "Andy" Tillery, PA-C  Remote monitoring is used to monitor your Pacemaker from home. This monitoring reduces the number of office visits required to check your device to one time per year. It allows us to keep an eye on the functioning of your device to ensure it is working properly. You are scheduled for a device check from home on 05/26/2020. You may send your transmission at any time that day. If you have a wireless device, the transmission will be sent automatically. After your physician reviews your transmission, you will receive a postcard with your next transmission date.  Any Other Special Instructions Will Be Listed Below (If Applicable).  If you need a refill on your cardiac medications before your next appointment, please call your pharmacy.       

## 2020-05-03 NOTE — Progress Notes (Signed)
Patient Care Team: Sumner, Marshall as PCP - General Evans Lance, MD as PCP - Electrophysiology (Cardiology) Buford Dresser, MD as PCP - Cardiology (Cardiology)  DIAGNOSIS:    ICD-10-CM   1. Ductal carcinoma in situ (DCIS) of left breast  D05.12     SUMMARY OF ONCOLOGIC HISTORY: Oncology History  Ductal carcinoma in situ (DCIS) of left breast  12/12/2019 Initial Diagnosis   Screening mammogram on 11/27/19 showed left breast calcifications. 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 She previously underwent a left breast biopsy of the 2:00 position in 2011.   01/22/2020 Surgery   Left lumpectomy (Tsuei): high grade DCIS, 1.6cm.    Surgery   Re-excision of positive margin (Tsuei): focal residual DCIS 0.2cm from the superior/lateral margin and 0.4cm form the inferior margin.    04/07/2020 - 05/01/2020 Radiation Therapy   Adjuvant radiation     CHIEF COMPLIANT: Follow-up of left breast DCIS to discuss further treatment  INTERVAL HISTORY: [name] is a 65 y.o. with above-mentioned history of left breast DCIS who underwent a left lumpectomy followed by re-excision and completed radiation on 05/01/20. She presents to the clinic today to discuss further treatment.   ALLERGIES:  is allergic to other, tape, codeine, fentanyl, morphine and related, and robaxin [methocarbamol].  MEDICATIONS:  Current Outpatient Medications  Medication Sig Dispense Refill  . anastrozole (ARIMIDEX) 1 MG tablet Take 1 tablet (1 mg total) by mouth daily. 90 tablet 3  . amitriptyline (ELAVIL) 25 MG tablet Take 29m by mouth every night  5  . CALCIUM PO Take 2 tablets by mouth daily.    . Carboxymethylcellul-Glycerin (LUBRICATING EYE DROPS OP) Place 1 drop into both eyes 2 (two) times daily.     . Cyanocobalamin 1000 MCG/ML KIT Inject 1,000 mcg every 30 (thirty) days as  directed.    . cyclobenzaprine (FLEXERIL) 10 MG tablet 1 tablet    . eletriptan (RELPAX) 40 MG tablet Take 40 mg by mouth daily as needed for migraine. May repeat in 2 hours if necessary    . ELIQUIS 5 MG TABS tablet TAKE 1 TABLET BY MOUTH TWICE A DAY 180 tablet 1  . gabapentin (NEURONTIN) 600 MG tablet Take 600 mg by mouth 2 (two) times daily.    .Marland Kitchenlevothyroxine (SYNTHROID, LEVOTHROID) 100 MCG tablet Take 100 mcg daily before breakfast by mouth.    . Melatonin 5 MG CAPS Take 5 mg by mouth at bedtime.    . Menthol (BIOFREEZE) 10 % AERO     . montelukast (SINGULAIR) 10 MG tablet Take 10 mg by mouth daily.    . Omega-3 Fatty Acids (FISH OIL) 1000 MG CAPS 1 capsule    . polyethylene glycol (MIRALAX / GLYCOLAX) packet Take 17 g every evening by mouth.    . propranolol (INDERAL) 60 MG tablet Take 60 mg by mouth daily.    .Marland Kitchenspironolactone (ALDACTONE) 25 MG tablet Take 0.5 tablets (12.5 mg total) by mouth daily. 45 tablet 3  . VITAMIN D PO Take 2 capsules by mouth daily.     No current facility-administered medications for this visit.    PHYSICAL EXAMINATION: ECOG PERFORMANCE STATUS: 2 - Symptomatic, <50% confined to bed  Vitals:   05/04/20 0840  BP: 108/72  Pulse: 68  Resp: 15  Temp: (!) 97 F (36.1 C)  SpO2: 98%   Filed Weights  05/04/20 0840  Weight: 162 lb 3.2 oz (73.6 kg)     LABORATORY DATA:  I have reviewed the data as listed CMP Latest Ref Rng & Units 02/28/2020 01/20/2020 02/27/2017  Glucose 70 - 99 mg/dL 97 98 102(H)  BUN 8 - 23 mg/dL '18 21 17  ' Creatinine 0.44 - 1.00 mg/dL 0.93 0.98 1.00  Sodium 135 - 145 mmol/L 138 137 140  Potassium 3.5 - 5.1 mmol/L 4.4 4.3 3.7  Chloride 98 - 111 mmol/L 103 100 102  CO2 22 - 32 mmol/L '26 27 26  ' Calcium 8.9 - 10.3 mg/dL 9.5 9.5 9.1  Total Protein 6.5 - 8.1 g/dL - - 6.1(L)  Total Bilirubin 0.3 - 1.2 mg/dL - - 0.5  Alkaline Phos 38 - 126 U/L - - 71  AST 15 - 41 U/L - - 30  ALT 14 - 54 U/L - - 30    Lab Results  Component Value  Date   WBC 11.9 (H) 02/27/2017   HGB 13.5 02/27/2017   HCT 41.4 02/27/2017   MCV 95.2 02/27/2017   PLT 306 02/27/2017   NEUTROABS 7.9 (H) 02/27/2017    ASSESSMENT & PLAN:  Ductal carcinoma in situ (DCIS) of left breast 01/22/2020:Left lumpectomy (Tsuei): high grade DCIS, 1.6cm.  ER 15% weak, PR 0%, broadly less than 1 mm from medial/superior margin and less than 1 mm from inferior margin, focally less than 1 mm from anterior margin  Treatment plan: Adjuvant radiation completed 05/01/2020 Adjuvant antiestrogen therapy with anastrozole to start 05/10/2020 (chosen because she has a prior history of splenic infarct)  Anastrozole counseling: We discussed the risks and benefits of anti-estrogen therapy with aromatase inhibitors. These include but not limited to insomnia, hot flashes, mood changes, vaginal dryness, bone density loss, and weight gain. We strongly believe that the benefits far outweigh the risks. Patient understands these risks and consented to starting treatment. Planned treatment duration is 5 years.   History of splenic infarct: We discussed the pros and cons of discontinuing anticoagulation.  She has been on Eliquis for over 3 years.  She really wants to come off of it.  Because it causes significant bruising.  I discussed with her about discontinuing it at this time.  However if she were to develop another blood dyscrasia then she will need to remain on it for life.  She is agreeable with that plan.   Return to clinic in 3 months for survivorship care plan visit (she does not like the word survivor)    No orders of the defined types were placed in this encounter.  The patient has a good understanding of the overall plan. she agrees with it. she will call with any problems that may develop before the next visit here.  Total time spent: 30 mins including face to face time and time spent for planning, charting and coordination of care  Rulon Eisenmenger, MD, MPH 05/04/2020  I,  Molly Dorshimer, am acting as scribe for Dr. Nicholas Lose.  I have reviewed the above documentation for accuracy and completeness, and I agree with the above.

## 2020-05-03 NOTE — Progress Notes (Signed)
  Patient Name: [name] MRN: [id number] DOB: 1955-04-08 Referring Physician: Nicholas Lose (Profile Not Attached) Date of Service: 05/01/2020 [facility], [place]                                                        End Of Treatment Note  Diagnoses: D05.12-Intraductal carcinoma in situ of left breast  Cancer Staging: High-grade ER positive DCIS of the left breast.   Intent: Curative  Radiation Treatment Dates: 04/06/2020 through 05/01/2020 Site Technique Total Dose (Gy) Dose per Fx (Gy) Completed Fx Beam Energies  Breast, Left: Breast_Lt 3D 42.56/42.56 2.66 16/16 6X  Breast, Left: Breast_Lt_Bst 3D 8/8 2 4/4 6X   Narrative: The patient tolerated radiation therapy relatively well. She developed irritation along the inframammary fold and this was either contact dermatitis from neomycin versus early candida. She stopped neosporin, and started miconazole powder. Her skin had improved at the last under treatment visit.  Plan: The patient will receive a call in about one month from the radiation oncology department. She will continue follow up with Dr. Lindi Adie as well.   ________________________________________________    Carola Rhine, [facility]

## 2020-05-04 ENCOUNTER — Inpatient Hospital Stay: Payer: Medicare PPO | Attending: Hematology and Oncology | Admitting: Hematology and Oncology

## 2020-05-04 ENCOUNTER — Telehealth: Payer: Self-pay | Admitting: Internal Medicine

## 2020-05-04 ENCOUNTER — Other Ambulatory Visit: Payer: Self-pay

## 2020-05-04 DIAGNOSIS — D0512 Intraductal carcinoma in situ of left breast: Secondary | ICD-10-CM

## 2020-05-04 DIAGNOSIS — Z923 Personal history of irradiation: Secondary | ICD-10-CM | POA: Diagnosis not present

## 2020-05-04 DIAGNOSIS — Z79811 Long term (current) use of aromatase inhibitors: Secondary | ICD-10-CM | POA: Diagnosis not present

## 2020-05-04 DIAGNOSIS — Z170 Estrogen receptor positive status [ER+]: Secondary | ICD-10-CM | POA: Diagnosis not present

## 2020-05-04 MED ORDER — ANASTROZOLE 1 MG PO TABS: 1.0000 mg | ORAL_TABLET | Freq: Every day | ORAL | 3 refills | Status: DC

## 2020-05-04 NOTE — Telephone Encounter (Signed)
Patient called in to say she is a breast cancer patient and the Dr. Geraldine Solar to take her off the ELIQUIS 5 MG TABS tablet starting May 1st and wants to put on cancer medication call anastrozole. Dr wants patient to take the medication for 5 years. Patient states she is concern about coming off the blood clout medication. Please advise

## 2020-05-04 NOTE — Assessment & Plan Note (Addendum)
01/22/2020:Left lumpectomy (Tsuei): high grade DCIS, 1.6cm.  ER 15% weak, PR 0%, broadly less than 1 mm from medial/superior margin and less than 1 mm from inferior margin, focally less than 1 mm from anterior margin  Treatment plan: Adjuvant radiation completed 05/01/2020 Adjuvant antiestrogen therapy with anastrozole to start 05/10/2020 (chosen because she has a prior history of splenic infarct)  Anastrozole counseling: We discussed the risks and benefits of anti-estrogen therapy with aromatase inhibitors. These include but not limited to insomnia, hot flashes, mood changes, vaginal dryness, bone density loss, and weight gain. We strongly believe that the benefits far outweigh the risks. Patient understands these risks and consented to starting treatment. Planned treatment duration is 5 years.   Return to clinic in 3 months for survivorship care plan visit (she does not like the word survivor)

## 2020-05-06 DIAGNOSIS — M47812 Spondylosis without myelopathy or radiculopathy, cervical region: Secondary | ICD-10-CM | POA: Diagnosis not present

## 2020-05-06 NOTE — Telephone Encounter (Signed)
Returned call to Pt.  Advised Dr. Lovena Le was off this week, but would have him address.  Per Pt-she wants Dr. Harrell Gave to address this question, not Dr. Lovena Le.  She states she needs an answer soon because she needs to start the new medication May 1.   Advised would send to Dr. Harrell Gave for review.

## 2020-05-08 DIAGNOSIS — E538 Deficiency of other specified B group vitamins: Secondary | ICD-10-CM | POA: Diagnosis not present

## 2020-05-08 NOTE — Telephone Encounter (Signed)
[name] is calling back in regards to this due to never hearing back. I advised her Dr. Harrell Gave is out of office due to a family emergency. She requested this be routed to both Dr. Lovena Le and Dr. Harrell Gave to see who can give her a response first due to being advised Dr. Lovena Le is currently out of office as well. Please advise.

## 2020-05-12 ENCOUNTER — Encounter (HOSPITAL_BASED_OUTPATIENT_CLINIC_OR_DEPARTMENT_OTHER): Payer: Self-pay

## 2020-05-13 ENCOUNTER — Telehealth: Payer: Self-pay | Admitting: *Deleted

## 2020-05-13 NOTE — Telephone Encounter (Signed)
   [facility] Pre-operative Risk Assessment    Patient Name: [name]  DOB: [birth year]  MRN: [id number]   HEARTCARE STAFF: - Please ensure there is not already an duplicate clearance open for this procedure. - Under Visit Info/Reason for Call, type in Other and utilize the format Clearance MM/DD/YY or Clearance TBD. Do not use dashes or single digits. - If request is for dental extraction, please clarify the # of teeth to be extracted.  Request for surgical clearance: STAT PER CLEARANCE REQUEST  1. What type of surgery is being performed? CERVICAL FACET INJECTION   2. When is this surgery scheduled? 05/20/20   3. What type of clearance is required (medical clearance vs. Pharmacy clearance to hold med vs. Both)? BOTH  4. Are there any medications that need to be held prior to surgery and how long? ELIQUIS x 5 DAYS PRIOR   5. Practice name and name of physician performing surgery? [facility]; DR. DAVID SPIVEY   6. What is the office phone number? [phone]   7.   What is the office fax number? [phone]  8.   Anesthesia type (None, local, MAC, general) ? TOPICAL   Julaine Hua 05/13/2020, 1:55 PM  _________________________________________________________________   (provider comments below)

## 2020-05-13 NOTE — Telephone Encounter (Signed)
I called to clear the patient for her neck injection and inform her of the eliquis hold. Apparently she had her neck injection last week.   Her more pressing question is oncology started her on anastrozole (arimidex) for 5 years and told her she would need to stop her eliquis for 5 years. She is requesting guidance from Dr. Harrell Gave. Dr. Harrell Gave is unavailable. I will forward to Dr. Lovena Le for updated recommendations.   I will also forward to pharmD to see if there is an alternative [place] she can take with this medication.

## 2020-05-13 NOTE — Telephone Encounter (Signed)
Can you please review request to hold eliquis for 5 days?

## 2020-05-13 NOTE — Telephone Encounter (Signed)
Patient with diagnosis of afib on Eliquis for anticoagulation.    Procedure: CERVICAL FACET INJECTION  Date of procedure: 05/20/20  CHA2DS2-VASc Score = 4  This indicates a 4.8% annual risk of stroke. The patient's score is based upon: CHF History: Yes HTN History: Yes Diabetes History: No Stroke History: No Vascular Disease History: No Age Score: 1 Gender Score: 1     CrCl 57.8 ml/min  Per office protocol, patient can hold Eliquis for 3 days prior to procedure.    MD is requesting a 5 day hold. I see no need to hold greater than 3 days. I will therefore have to defer to MD.

## 2020-05-13 NOTE — Telephone Encounter (Signed)
Per Dr. Jaclyn Prime hold Eliquis for 3 days.

## 2020-05-14 ENCOUNTER — Encounter: Payer: Self-pay | Admitting: Hematology and Oncology

## 2020-05-14 NOTE — Telephone Encounter (Signed)
Per Angie's note below, patient already had neck injection last week. As far as discontinuing Eliquis goes, Dr. Tanna Furry RN responded to patient's MyChart message yesterday 05/13/2020 advising patient that she should NOT stop Eliquis. I did call patient today and re-iterate Dr. Tanna Furry recommendation to continue Eliquis. I also confirmed that patient has already had procedure. Therefore, will remove from pre-op pool.  Darreld Mclean, PA-C 05/14/2020 8:11 AM

## 2020-05-14 NOTE — Telephone Encounter (Signed)
There is no drug interaction with anastrozole. There isnt any reason I can see that she needs to come off Eliquis. From the oncology notes, it sounds like patient doesn't want to take Eliquis due to bruising. He discussed the risks with her about clotting based on her hx of splenic infarct, however did not talk about the stroke risk with her afib.

## 2020-05-14 NOTE — Telephone Encounter (Signed)
Responded via mychart.  Advised per Dr. Jarold Motto is advised to continue Eliquis.

## 2020-05-14 NOTE — Telephone Encounter (Signed)
Warfarin is the only alternative. Bruising is not a reason to stop the eliquis.

## 2020-05-26 ENCOUNTER — Ambulatory Visit (INDEPENDENT_AMBULATORY_CARE_PROVIDER_SITE_OTHER): Payer: Medicare PPO

## 2020-05-26 ENCOUNTER — Telehealth: Payer: Self-pay | Admitting: Emergency Medicine

## 2020-05-26 DIAGNOSIS — I428 Other cardiomyopathies: Secondary | ICD-10-CM | POA: Diagnosis not present

## 2020-05-26 LAB — CUP PACEART REMOTE DEVICE CHECK
Battery Remaining Longevity: 82 mo
Battery Voltage: 2.98 V
Brady Statistic AP VP Percent: 0.7 %
Brady Statistic AP VS Percent: 0.03 %
Brady Statistic AS VP Percent: 97.5 %
Brady Statistic AS VS Percent: 1.77 %
Brady Statistic RA Percent Paced: 0.74 %
Brady Statistic RV Percent Paced: 6.52 %
Date Time Interrogation Session: 20220516222613
Implantable Lead Implant Date: 20130110
Implantable Lead Implant Date: 20130110
Implantable Lead Implant Date: 20130110
Implantable Lead Location: 753858
Implantable Lead Location: 753859
Implantable Lead Location: 753860
Implantable Lead Model: 4196
Implantable Lead Model: 5076
Implantable Lead Model: 6935
Implantable Pulse Generator Implant Date: 20181115
Lead Channel Impedance Value: 1235 Ohm
Lead Channel Impedance Value: 342 Ohm
Lead Channel Impedance Value: 418 Ohm
Lead Channel Impedance Value: 570 Ohm
Lead Channel Impedance Value: 684 Ohm
Lead Channel Impedance Value: 760 Ohm
Lead Channel Impedance Value: 779 Ohm
Lead Channel Impedance Value: 836 Ohm
Lead Channel Impedance Value: 893 Ohm
Lead Channel Pacing Threshold Amplitude: 0.625 V
Lead Channel Pacing Threshold Amplitude: 1.75 V
Lead Channel Pacing Threshold Amplitude: 2.375 V
Lead Channel Pacing Threshold Pulse Width: 0.4 ms
Lead Channel Pacing Threshold Pulse Width: 0.4 ms
Lead Channel Pacing Threshold Pulse Width: 0.8 ms
Lead Channel Sensing Intrinsic Amplitude: 1.125 mV
Lead Channel Sensing Intrinsic Amplitude: 1.125 mV
Lead Channel Sensing Intrinsic Amplitude: 15 mV
Lead Channel Sensing Intrinsic Amplitude: 15 mV
Lead Channel Setting Pacing Amplitude: 2 V
Lead Channel Setting Pacing Amplitude: 2.25 V
Lead Channel Setting Pacing Amplitude: 4 V
Lead Channel Setting Pacing Pulse Width: 0.8 ms
Lead Channel Setting Pacing Pulse Width: 1 ms
Lead Channel Setting Sensing Sensitivity: 0.9 mV

## 2020-05-26 NOTE — Telephone Encounter (Signed)
Low sodium diet. No change change in treatment.

## 2020-05-26 NOTE — Telephone Encounter (Signed)
   Attempted to contact patient in reference to elevated OptiVol for assessment and medication compliance. Patient states that she has had swelling in her ankles since Saturday. Patient states no shortness of breath. She recently started taking Anastrozole 1 mg daily and her spironolactone dosage has changed to 12.5 mg once daily. Patient also states that she has started her water aerobics. Routing to Dr. Lovena Le for review

## 2020-05-27 NOTE — Telephone Encounter (Signed)
Contacted patient and gave her Dr. Tanna Furry recommendations of a low sodium diet.

## 2020-05-28 DIAGNOSIS — M5412 Radiculopathy, cervical region: Secondary | ICD-10-CM | POA: Diagnosis not present

## 2020-05-28 DIAGNOSIS — R300 Dysuria: Secondary | ICD-10-CM | POA: Diagnosis not present

## 2020-05-28 DIAGNOSIS — N390 Urinary tract infection, site not specified: Secondary | ICD-10-CM | POA: Diagnosis not present

## 2020-05-28 DIAGNOSIS — G43909 Migraine, unspecified, not intractable, without status migrainosus: Secondary | ICD-10-CM | POA: Diagnosis not present

## 2020-06-01 ENCOUNTER — Ambulatory Visit
Admission: RE | Admit: 2020-06-01 | Discharge: 2020-06-01 | Disposition: A | Discharge status: Home / Self Care | Payer: Medicare PPO | Source: Ambulatory Visit | Attending: Radiation Oncology | Admitting: Radiation Oncology

## 2020-06-01 DIAGNOSIS — D0512 Intraductal carcinoma in situ of left breast: Secondary | ICD-10-CM

## 2020-06-01 NOTE — Progress Notes (Signed)
  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   Interval Since Last Radiation: 5 weeks   04/06/2020 through 05/01/2020 Site Technique Total Dose (Gy) Dose per Fx (Gy) Completed Fx Beam Energies  Breast, Left: Breast_Lt 3D 42.56/42.56 2.66 16/16 6X  Breast, Left: Breast_Lt_Bst 3D 8/8 2 4/4 6X     Narrative:  The patient was contacted today for routine follow-up. During treatment she did very well with radiotherapy and did not have significant desquamation.   Impression/Plan: 1. 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 I could not reach the patient and left a voicemail instead and on it, discussed that we would be happy to continue to follow her as needed, but she will also continue to follow up with Dr. Lindi Adie in medical oncology. She was counseled on skin care as well as measures to avoid sun exposure to this area.         Carola Rhine, PAC

## 2020-06-08 DIAGNOSIS — L259 Unspecified contact dermatitis, unspecified cause: Secondary | ICD-10-CM | POA: Diagnosis not present

## 2020-06-09 ENCOUNTER — Other Ambulatory Visit: Payer: Self-pay | Admitting: Plastic Surgery

## 2020-06-11 DIAGNOSIS — E538 Deficiency of other specified B group vitamins: Secondary | ICD-10-CM | POA: Diagnosis not present

## 2020-06-11 DIAGNOSIS — E039 Hypothyroidism, unspecified: Secondary | ICD-10-CM | POA: Diagnosis not present

## 2020-06-11 DIAGNOSIS — Z0000 Encounter for general adult medical examination without abnormal findings: Secondary | ICD-10-CM | POA: Diagnosis not present

## 2020-06-11 DIAGNOSIS — G250 Essential tremor: Secondary | ICD-10-CM | POA: Diagnosis not present

## 2020-06-11 DIAGNOSIS — Z136 Encounter for screening for cardiovascular disorders: Secondary | ICD-10-CM | POA: Diagnosis not present

## 2020-06-11 DIAGNOSIS — G629 Polyneuropathy, unspecified: Secondary | ICD-10-CM | POA: Diagnosis not present

## 2020-06-18 NOTE — Progress Notes (Signed)
Remote pacemaker transmission.   

## 2020-06-19 DIAGNOSIS — Z136 Encounter for screening for cardiovascular disorders: Secondary | ICD-10-CM | POA: Diagnosis not present

## 2020-06-19 DIAGNOSIS — G629 Polyneuropathy, unspecified: Secondary | ICD-10-CM | POA: Diagnosis not present

## 2020-06-19 DIAGNOSIS — E039 Hypothyroidism, unspecified: Secondary | ICD-10-CM | POA: Diagnosis not present

## 2020-07-01 DIAGNOSIS — L84 Corns and callosities: Secondary | ICD-10-CM | POA: Diagnosis not present

## 2020-07-01 DIAGNOSIS — L603 Nail dystrophy: Secondary | ICD-10-CM | POA: Diagnosis not present

## 2020-07-01 DIAGNOSIS — I739 Peripheral vascular disease, unspecified: Secondary | ICD-10-CM | POA: Diagnosis not present

## 2020-07-09 ENCOUNTER — Encounter: Payer: Self-pay | Admitting: Cardiology

## 2020-07-14 DIAGNOSIS — E538 Deficiency of other specified B group vitamins: Secondary | ICD-10-CM | POA: Diagnosis not present

## 2020-07-27 ENCOUNTER — Encounter (HOSPITAL_BASED_OUTPATIENT_CLINIC_OR_DEPARTMENT_OTHER): Payer: Self-pay | Admitting: Cardiology

## 2020-07-27 ENCOUNTER — Other Ambulatory Visit: Payer: Self-pay

## 2020-07-27 ENCOUNTER — Ambulatory Visit (HOSPITAL_BASED_OUTPATIENT_CLINIC_OR_DEPARTMENT_OTHER): Payer: Medicare PPO | Admitting: Cardiology

## 2020-07-27 VITALS — BP 130/68 | HR 56 | Ht 67.5 in | Wt 159.0 lb

## 2020-07-27 DIAGNOSIS — R0600 Dyspnea, unspecified: Secondary | ICD-10-CM

## 2020-07-27 DIAGNOSIS — Z7901 Long term (current) use of anticoagulants: Secondary | ICD-10-CM | POA: Diagnosis not present

## 2020-07-27 DIAGNOSIS — I428 Other cardiomyopathies: Secondary | ICD-10-CM

## 2020-07-27 DIAGNOSIS — I480 Paroxysmal atrial fibrillation: Secondary | ICD-10-CM | POA: Diagnosis not present

## 2020-07-27 DIAGNOSIS — Z950 Presence of cardiac pacemaker: Secondary | ICD-10-CM

## 2020-07-27 DIAGNOSIS — R0609 Other forms of dyspnea: Secondary | ICD-10-CM

## 2020-07-27 NOTE — Patient Instructions (Signed)
Medication Instructions:  No changes *If you need a refill on your cardiac medications before your next appointment, please call your pharmacy*   Lab Work: None If you have labs (blood work) drawn today and your tests are completely normal, you will receive your results only by: [place] (if you have MyChart) OR A paper copy in the mail If you have any lab test that is abnormal or we need to change your treatment, we will call you to review the results.   Testing/Procedures: Echocardiogram at [facility] (requested [place] as the tech)   Follow-Up: At [facility], you and your health needs are our priority.  As part of our continuing mission to provide you with exceptional heart care, we have created designated Provider Care Teams.  These Care Teams include your primary Cardiologist (physician) and Advanced Practice Providers (APPs -  Physician Assistants and Nurse Practitioners) who all work together to provide you with the care you need, when you need it.  We recommend signing up for the patient portal called "MyChart".  Sign up information is provided on this After Visit Summary.  MyChart is used to connect with patients for Virtual Visits (Telemedicine).  Patients are able to view lab/test results, encounter notes, upcoming appointments, etc.  Non-urgent messages can be sent to your provider as well.   To learn more about what you can do with MyChart, go to [email or web address].    Your next appointment:   3 month(s)  The format for your next appointment:   In Person  Provider:   Buford Dresser, MD   Other Instructions We will get the echo to make sure the pump function hasn't worsened. The sounds in your lungs sound like atelectasis (small areas of the lungs that are closed). I've included a sheet on how to use an incentive spirometer if you can find one, but in general you can do the exercises in a similar way without a spirometer too.
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
## 2020-07-30 ENCOUNTER — Ambulatory Visit (INDEPENDENT_AMBULATORY_CARE_PROVIDER_SITE_OTHER): Payer: Self-pay | Admitting: Plastic Surgery

## 2020-07-30 ENCOUNTER — Other Ambulatory Visit: Payer: Self-pay

## 2020-07-30 ENCOUNTER — Encounter: Payer: Self-pay | Admitting: Plastic Surgery

## 2020-07-30 DIAGNOSIS — L905 Scar conditions and fibrosis of skin: Secondary | ICD-10-CM

## 2020-07-30 NOTE — Progress Notes (Signed)
[place]  Preoperative Dx: scar and hyperpigmentation of face  Postoperative Dx:  same  Procedure: laser to face / right leg   Anesthesia: none  Description of Procedure:  Risks and complications were explained to the patient. Consent was confirmed and signed. Time out was called and all information was confirmed to be correct. The area  area was prepped with alcohol and wiped dry. The BBL laser was set at 6 J/cm2. The face and right leg were lasered. The patient tolerated the procedure well and there were no complications. The patient is to follow up in 4 weeks.  Pictures were obtained of the patient and placed in the chart with the patient's or guardian's permission.

## 2020-07-31 ENCOUNTER — Other Ambulatory Visit: Payer: Medicare PPO | Admitting: Plastic Surgery

## 2020-08-02 NOTE — Progress Notes (Signed)
SURVIVORSHIP VISIT:  BRIEF ONCOLOGIC HISTORY:  Oncology History  Ductal carcinoma in situ (DCIS) of left breast  12/12/2019 Initial Diagnosis   Screening mammogram on 11/27/19 showed left breast calcifications. 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 She previously underwent a left breast biopsy of the 2:00 position in 2011.   01/22/2020 Surgery   Left lumpectomy ([facility]): high grade DCIS, 1.6cm.    Surgery   Re-excision of positive margin ([place]): focal residual DCIS 0.2cm from the superior/lateral margin and 0.4cm form the inferior margin.    01/22/2020 Cancer Staging   Staging form: Breast, AJCC 8th Edition - Clinical stage from 01/22/2020: Stage 0 (cTis (DCIS), cN0, cM0, ER+, PR-) - Signed by Gardenia Phlegm, NP on 07/28/2020  Stage prefix: Initial diagnosis    01/22/2020 Cancer Staging   Staging form: Breast, AJCC 8th Edition - Pathologic stage from 01/22/2020: Stage 0 (pTis (DCIS), pN0, cM0) - Signed by Gardenia Phlegm, NP on 07/28/2020  Stage prefix: Initial diagnosis    04/07/2020 - 05/01/2020 Radiation Therapy   Adjuvant radiation   05/10/2020 -  Anti-estrogen oral therapy   Anastrozole daily     INTERVAL HISTORY:  Ms. Edinger to review her survivorship care plan detailing her treatment course for breast cancer, as well as monitoring long-term side effects of that treatment, education regarding health maintenance, screening, and overall wellness and health promotion.     Overall, Ms. Bulluck reports feeling quite well.  She is taking Anastrozole daily.  She notes some hot flashes, but these are manageable.  She has vaginal dryness and it is very troublesome for her.  She underwent mona lisa touch with no relief.  She underwent bone density testing in 2020 at [facility].  She notes some fullness in her left breast and axilla and is concerned for recurrence.    REVIEW  OF SYSTEMS:  Review of Systems  Constitutional:  Negative for appetite change, chills, fatigue, fever and unexpected weight change.  HENT:   Negative for hearing loss, lump/mass and trouble swallowing.   Eyes:  Negative for eye problems and icterus.  Respiratory:  Negative for chest tightness, cough and shortness of breath.   Cardiovascular:  Negative for chest pain, leg swelling and palpitations.  Gastrointestinal:  Negative for abdominal distention, abdominal pain, constipation, diarrhea, nausea and vomiting.  Endocrine: Positive for hot flashes.  Genitourinary:  Negative for difficulty urinating.   Musculoskeletal:  Negative for arthralgias.  Skin:  Negative for itching and rash.  Neurological:  Negative for dizziness, extremity weakness, headaches and numbness.  Hematological:  Negative for adenopathy. Does not bruise/bleed easily.  Psychiatric/Behavioral:  Negative for depression. The patient is not nervous/anxious.   Breast: Denies any new nodularity, masses, tenderness, nipple changes, or nipple discharge.      ONCOLOGY TREATMENT TEAM:  1. Surgeon:  Dr. Georgette Dover at [facility] 2. Medical Oncologist: Dr. Lindi Adie  3. Radiation Oncologist: Dr. Lisbeth Renshaw    PAST MEDICAL/SURGICAL HISTORY:  Past Medical History:  Diagnosis Date   AF (paroxysmal atrial fibrillation) ([place])    Arthritis    Basal cell carcinoma 11/11/1998   left forehead-tx dr Tonia Brooms   [facility] (basal cell carcinoma) 03/11/2003   left distal lower leg ankle-tx dr Tonia Brooms   [facility] (basal cell carcinoma) 11/19/2009   left anterior lat distal lower leg   Biventricular cardiac pacemaker in situ    a. prior CRT-D in 2013  with downgrade to CRT-Pacemaker only in 11/2016.   Breast cancer ([place]) 19/37/9024   Chronic systolic CHF (congestive heart failure) (HCC)    Complication of anesthesia    aspiration   Essential tremor    Fibroids    GERD (gastroesophageal reflux disease)    otc   Hypertension    Hyperthyroidism     Hypothyroidism    LBBB (left bundle branch block)    conduction disorder   Migraines    Mild aortic insufficiency    NICM (nonischemic cardiomyopathy) ([place])    a. 2012: normal coronaries, EF 25-30%. b. improved to 55% 11/2016.   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   right dorsal lateral upper nose-mohs   Past Surgical History:  Procedure Laterality Date   ARTERY BIOPSY  12/29/2011   Procedure: MINOR BIOPSY TEMPORAL ARTERY;  Surgeon: Edward Jolly, MD;  Location: [place];  Service: General;  Laterality: Right;  Right temporal artery biopsy   BACK SURGERY  12/31/2009   "for flat back syndrome; fused bottom of my back"   BI-VENTRICULAR IMPLANTABLE CARDIOVERTER DEFIBRILLATOR Left 01/20/2011   Procedure: BI-VENTRICULAR IMPLANTABLE CARDIOVERTER DEFIBRILLATOR  (CRT-D);  Surgeon: Evans Lance, MD;  Location: [facility] CATH LAB;  Service: Cardiovascular;  Laterality: Left;   BIV ICD GENERATOR CHANGEOUT N/A 11/24/2016   Procedure: BIV ICD GENERATOR CHANGEOUT;  Surgeon: Evans Lance, MD;  Location: [place] CV LAB;  Service: Cardiovascular;  Laterality: N/A;   BREAST LUMPECTOMY WITH RADIOACTIVE SEED LOCALIZATION Left 01/22/2020   Procedure: LEFT BREAST LUMPECTOMY WITH RADIOACTIVE SEED LOCALIZATION;  Surgeon: Donnie Mesa, MD;  Location: [place];  Service: General;  Laterality: Left;  LMA   CHOLECYSTECTOMY  ~1996   FOOT NEUROMA SURGERY  ~ 2003   right   RE-EXCISION OF BREAST LUMPECTOMY Left 03/03/2020   Procedure: RE-EXCISION MARGINS OF LEFT BREAST LUMPECTOMY;   Surgeon: Donnie Mesa, MD;  Location: [place];  Service: General;  Laterality: Left;   ROTATOR CUFF REPAIR  ~ 2009   left   SHOULDER ARTHROSCOPY W/ ROTATOR CUFF REPAIR  09/21/2011   SKIN CANCER EXCISION     nose, forehead, left leg   TEE WITHOUT CARDIOVERSION N/A 03/01/2017   Procedure: TRANSESOPHAGEAL ECHOCARDIOGRAM (TEE);  Surgeon: Skeet Latch, MD;  Location: [place];  Service: Cardiovascular;  Laterality: N/A;   THYROID LOBECTOMY  ~ 2006   right   TUBAL LIGATION  ~ [place]  ~ 2009   VESICOVAGINAL FISTULA CLOSURE W/ TAH       ALLERGIES:  Allergies  Allergen Reactions   Other Nausea And Vomiting and Other (See Comments)    Most narcotic pain meds cause N/V and CHEST PAIN; needs an antiemetic to tolerate  Also, patient was diagnosed with Raynaud's disease and was told to NOT place IV(s) in either hand because of poor circulation   Tape Other (See Comments)    PATIENT'S SKIN IS VERY THIN; IT TEARS, BRUISES, AND BLEEDS VERY EASILY (PLEASE USE COBAN WRAP, IF POSSIBLE!!)  Codeine Other (See Comments)    Chest pain   Fentanyl Other (See Comments)    Bad dreams   Morphine And Related Nausea And Vomiting   Robaxin [Methocarbamol] Other (See Comments)    Chest pain     CURRENT MEDICATIONS:  Outpatient Encounter Medications as of 08/03/2020  Medication Sig   acetaminophen (TYLENOL) 325 MG tablet Take 650 mg by mouth as needed.   amitriptyline (ELAVIL) 25 MG tablet Take 60m by mouth every night   anastrozole (ARIMIDEX) 1 MG tablet Take 1 tablet (1 mg total) by mouth daily.   CALCIUM PO Take 2 tablets by mouth daily.   Carboxymethylcellul-Glycerin (LUBRICATING EYE DROPS OP) Place 1 drop into both eyes 2 (two) times daily.    cetirizine (ZYRTEC) 10 MG tablet Take 10 mg by mouth daily.   Cranberry 400 MG CAPS Take 2 capsules by mouth daily in the afternoon.   Cyanocobalamin 1000 MCG/ML KIT Inject 1,000 mcg every 30 (thirty) days as  directed.   cyclobenzaprine (FLEXERIL) 10 MG tablet 1 tablet   eletriptan (RELPAX) 40 MG tablet Take 40 mg by mouth daily as needed for migraine. May repeat in 2 hours if necessary   ELIQUIS 5 MG TABS tablet TAKE 1 TABLET BY MOUTH TWICE A DAY   gabapentin (NEURONTIN) 600 MG tablet Take 600 mg by mouth 2 (two) times daily.   levothyroxine (SYNTHROID, LEVOTHROID) 100 MCG tablet Take 100 mcg daily before breakfast by mouth.   Melatonin 5 MG CAPS Take 5 mg by mouth at bedtime.   Menthol (BIOFREEZE) 10 % AERO    Omega-3 Fatty Acids (FISH OIL) 1000 MG CAPS 1 capsule   polyethylene glycol (MIRALAX / GLYCOLAX) packet Take 17 g every evening by mouth.   propranolol (INDERAL) 60 MG tablet Take 60 mg by mouth daily.   pseudoephedrine (SUDAFED) 30 MG tablet Take 30 mg by mouth as needed for congestion.   spironolactone (ALDACTONE) 25 MG tablet Take 0.5 tablets (12.5 mg total) by mouth daily.   VITAMIN D PO Take 2 capsules by mouth daily.   No facility-administered encounter medications on file as of 08/03/2020.     ONCOLOGIC FAMILY HISTORY:  Family History  Problem Relation Age of Onset   Heart disease Mother    Heart failure Mother 875      CHF   Stroke Mother    Hypertension Mother    Heart disease Father 910  Macular degeneration Father    Heart disease Sister    Atrial fibrillation Sister    Heart disease Sister      GENETIC COUNSELING/TESTING: Not at this time  SOCIAL HISTORY:  Social History   Socioeconomic History   Marital status: Married    Spouse name: MRonalee Belts  Number of children: 1   Years of education: MA   Highest education level: Not on file  Occupational History   Occupation: TPharmacist, hospital   Comment: n/a  Tobacco Use   Smoking status: Never   Smokeless tobacco: Never  Vaping Use   Vaping Use: Never used  Substance and Sexual Activity   Alcohol use: No   Drug use: No   Sexual activity: Yes  Other Topics Concern   Not on file  Social History Narrative   Patient  lives at home with family.   Caffeine Use: 1-2 cups daily   Social Determinants of Health   Financial Resource Strain: Low Risk    Difficulty of Paying Living Expenses: Not hard at all  Food Insecurity: No Food Insecurity   Worried About Charity fundraiser in the Last Year: Never true   Ran Out of Food in the Last Year: Never true  Transportation Needs: No Transportation Needs   Lack of Transportation (Medical): No   Lack of Transportation (Non-Medical): No  Physical Activity: Not on file  Stress: Not on file  Social Connections: Not on file  Intimate Partner Violence: Not on file     OBSERVATIONS/OBJECTIVE:  BP (!) 113/55 (BP Location: Left Arm, Patient Position: Sitting)   Pulse 60   Temp 97.7 F (36.5 C) (Temporal)   Resp 18   Ht 5' 7.5" (1.715 m)   Wt 159 lb 9.6 oz (72.4 kg)   SpO2 99%   BMI 24.63 kg/m  GENERAL: Patient is a well appearing female in no acute distress HEENT:  Sclerae anicteric.  Oropharynx clear and moist. No ulcerations or evidence of oropharyngeal candidiasis. Neck is supple.  NODES:  No cervical, supraclavicular, or axillary lymphadenopathy palpated.  BREAST EXAM:  left breast s/p lumpectomy and radiation, no sign of local recurrence, right breast is benign LUNGS:  Clear to auscultation bilaterally.  No wheezes or rhonchi. HEART:  Regular rate and rhythm. No murmur appreciated. ABDOMEN:  Soft, nontender.  Positive, normoactive bowel sounds. No organomegaly palpated. MSK:  No focal spinal tenderness to palpation. Full range of motion bilaterally in the upper extremities. EXTREMITIES:  No peripheral edema.   SKIN:  Clear with no obvious rashes or skin changes. No nail dyscrasia. NEURO:  Nonfocal. Well oriented.  Appropriate affect.   LABORATORY DATA:  None for this visit.  DIAGNOSTIC IMAGING:  None for this visit.      ASSESSMENT AND PLAN:  Ms.. Kobler is a pleasant 65 y.o. female with Stage 0 left breast DCIS, ER+/PR+, diagnosed in 12/2019,  treated with lumpectomy, adjuvant radiation therapy, and anti-estrogen therapy with Anastrozole beginning in 05/2020.  She presents to the Survivorship Clinic for our initial meeting and routine follow-up post-completion of treatment for breast cancer.    1. Stage 0 left breast cancer:  Ms. Herbig is continuing to recover from definitive treatment for breast cancer. She will follow-up with her medical oncologist, Dr. Lindi Adie in 6 months with history and physical exam per surveillance protocol.  She will continue her anti-estrogen therapy with Anastrozole. Thus far, she is tolerating the Anastrozole well, with minimal side effects. She was instructed to make Dr. Lindi Adie or myself aware if she begins to experience any worsening side effects of the medication and I could see her back in clinic to help manage those side effects, as needed. Her mammogram is due 11/2020; orders placed today. For the pain in her left breast, I placed an order for an ultrasound to evaluate further.  The area is benign on exam.  Today, a comprehensive survivorship care plan and treatment summary was reviewed with the patient today detailing her breast cancer diagnosis, treatment course, potential late/long-term effects of treatment, appropriate follow-up care with recommendations for the future, and patient education resources.  A copy of this summary, along with a letter will be sent to the patient's primary care provider via mail/fax/In Basket message after today's visit.    2. Bone health:  Given Ms. Mckendree's age/history of breast cancer and her current treatment regimen including anti-estrogen therapy with Anastrozole, she is at risk for bone demineralization.  Her last DEXA scan was in 2020, which showed osteopenia with a T score of -2.30 in the left femur.  In the meantime, she was encouraged to increase her consumption of foods rich in calcium, as well as increase her weight-bearing activities.  She was given education on  specific activities to promote bone health.  3. Cancer screening:  Due to Ms. Hedglin's history and her age, she should receive screening for skin cancers, colon cancer, and gynecologic cancers.  The information and recommendations are listed on the patient's comprehensive care plan/treatment summary and were reviewed in detail with the patient.    4. Health maintenance and wellness promotion: Ms. Ivins was encouraged to consume 5-7 servings of fruits and vegetables per day. We reviewed the "Nutrition Rainbow" handout, as well as the handout "Take Control of Your Health and Reduce Your Cancer Risk" from the [employer].  She was also encouraged to engage in moderate to vigorous exercise for 30 minutes per day most days of the week. We discussed the [employer] fitness program, which is designed for cancer survivors to help them become more physically fit after cancer treatments.  She was instructed to limit her alcohol consumption and continue to abstain from tobacco use.     5. Support services/counseling: It is not uncommon for this period of the patient's cancer care trajectory to be one of many emotions and stressors.  We discussed how this can be increasingly difficult during the times of quarantine and social distancing due to the COVID-19 pandemic.   She was given information regarding our available services and encouraged to contact me with any questions or for help enrolling in any of our support group/programs.   6. Vaginal dryness/atrophy: Referral to PT placed for pelvic rehab.     Follow up instructions:    -Return to cancer center in 6 months for f/u with Dr. Lindi Adie  -Mammogram due in 11/2020 -Bone density in 11/2020 -Breast ultrasound -Pelvic rehab PT referral placed -Follow up with surgery in one year   -She is welcome to return back to the Survivorship Clinic at any time; no additional follow-up needed at this time.  -Consider referral back to survivorship as  a long-term survivor for continued surveillance  The patient was provided an opportunity to ask questions and all were answered. The patient agreed with the plan and demonstrated an understanding of the instructions.   Total encounter time: 30 minutes in SCP preparation, chart review, order entry, face to face visit time, and documentation of the encounter.   Wilber Bihari, NP 08/02/20 3:22 PM Medical Oncology and Hematology [facility] [place], [place] [address] Tel. [phone]    Fax. [phone]  *Total Encounter Time as defined by the Centers for Medicare and Medicaid Services includes, in addition to the face-to-face time of a patient visit (documented in the note above) non-face-to-face time: obtaining and reviewing outside history, ordering and reviewing medications, tests or procedures, care coordination (communications with other health care professionals or caregivers) and documentation in the medical record.

## 2020-08-03 ENCOUNTER — Encounter: Payer: Self-pay | Admitting: Adult Health

## 2020-08-03 ENCOUNTER — Other Ambulatory Visit: Payer: Self-pay

## 2020-08-03 ENCOUNTER — Inpatient Hospital Stay: Payer: Medicare PPO | Attending: Adult Health | Admitting: Adult Health

## 2020-08-03 VITALS — BP 113/55 | HR 60 | Temp 97.7°F | Resp 18 | Ht 67.5 in | Wt 159.6 lb

## 2020-08-03 DIAGNOSIS — Z170 Estrogen receptor positive status [ER+]: Secondary | ICD-10-CM | POA: Insufficient documentation

## 2020-08-03 DIAGNOSIS — Z923 Personal history of irradiation: Secondary | ICD-10-CM | POA: Diagnosis not present

## 2020-08-03 DIAGNOSIS — N644 Mastodynia: Secondary | ICD-10-CM | POA: Insufficient documentation

## 2020-08-03 DIAGNOSIS — M8580 Other specified disorders of bone density and structure, unspecified site: Secondary | ICD-10-CM | POA: Diagnosis not present

## 2020-08-03 DIAGNOSIS — D0512 Intraductal carcinoma in situ of left breast: Secondary | ICD-10-CM | POA: Diagnosis not present

## 2020-08-03 DIAGNOSIS — E2839 Other primary ovarian failure: Secondary | ICD-10-CM

## 2020-08-03 DIAGNOSIS — N898 Other specified noninflammatory disorders of vagina: Secondary | ICD-10-CM | POA: Insufficient documentation

## 2020-08-03 DIAGNOSIS — N951 Menopausal and female climacteric states: Secondary | ICD-10-CM | POA: Diagnosis not present

## 2020-08-03 DIAGNOSIS — Z79811 Long term (current) use of aromatase inhibitors: Secondary | ICD-10-CM | POA: Diagnosis not present

## 2020-08-03 NOTE — Progress Notes (Unsigned)
Pt reports to me on Survivorship assessment that she is experiencing great deal of vaginal dryness/discomfort, especially during sexual activities. Advised pt to use water based lubricants during sexual activities, and otherwise use coconut oil for discomfort secondary to vaginal dryness. Pt reports she had MonaLisa Touch and it was extremely painful and caused multiple on-going UTIs.  All was reported to Wilber Bihari, NP.

## 2020-08-06 ENCOUNTER — Encounter: Payer: Medicare PPO | Admitting: Adult Health

## 2020-08-07 ENCOUNTER — Telehealth: Payer: Self-pay | Admitting: *Deleted

## 2020-08-07 NOTE — Telephone Encounter (Signed)
This RN was asked to call pt per her concerns with dyspareunia to discuss and give resources. The pt states she is planning on seeing her GYN soon " and I will have her manage this ".  This RN validated her plan as well as to call if she has further questions or concerns - this RN's name given for return call.

## 2020-08-10 ENCOUNTER — Ambulatory Visit (HOSPITAL_COMMUNITY): Payer: Medicare PPO

## 2020-08-10 ENCOUNTER — Other Ambulatory Visit: Payer: Self-pay

## 2020-08-12 ENCOUNTER — Ambulatory Visit (HOSPITAL_COMMUNITY): Payer: Medicare PPO | Attending: Cardiovascular Disease

## 2020-08-12 ENCOUNTER — Other Ambulatory Visit: Payer: Self-pay

## 2020-08-12 DIAGNOSIS — I428 Other cardiomyopathies: Secondary | ICD-10-CM | POA: Insufficient documentation

## 2020-08-12 DIAGNOSIS — R0600 Dyspnea, unspecified: Secondary | ICD-10-CM | POA: Diagnosis not present

## 2020-08-12 LAB — ECHOCARDIOGRAM COMPLETE
Area-P 1/2: 2.46 cm2
P 1/2 time: 668 msec
S' Lateral: 2 cm

## 2020-08-17 DIAGNOSIS — E538 Deficiency of other specified B group vitamins: Secondary | ICD-10-CM | POA: Diagnosis not present

## 2020-08-20 ENCOUNTER — Other Ambulatory Visit: Payer: Self-pay | Admitting: Cardiology

## 2020-08-20 NOTE — Telephone Encounter (Signed)
Prescription refill request for Eliquis received. Indication:afib Last office visit:christopher 07/27/20 Scr:0.93 02/28/20 Age: 24fWeight:72.4kg

## 2020-08-21 ENCOUNTER — Telehealth: Payer: Self-pay | Admitting: Cardiology

## 2020-08-21 ENCOUNTER — Ambulatory Visit (INDEPENDENT_AMBULATORY_CARE_PROVIDER_SITE_OTHER): Payer: Medicare PPO | Admitting: Plastic Surgery

## 2020-08-21 ENCOUNTER — Encounter: Payer: Self-pay | Admitting: Plastic Surgery

## 2020-08-21 ENCOUNTER — Other Ambulatory Visit: Payer: Self-pay

## 2020-08-21 DIAGNOSIS — I428 Other cardiomyopathies: Secondary | ICD-10-CM

## 2020-08-21 DIAGNOSIS — L905 Scar conditions and fibrosis of skin: Secondary | ICD-10-CM

## 2020-08-21 NOTE — Progress Notes (Signed)
[place]  Preoperative Dx: Scar of upper right lip and hyperpigmentation of right hand  Postoperative Dx:  same  Procedure: laser to right upper lip and right hand  Anesthesia: none  Description of Procedure:  Risks and complications were explained to the patient. Consent was confirmed and signed. Time out was called and all information was confirmed to be correct. The area  area was prepped with alcohol and wiped dry. The BBL laser was set at base for the face and pigment for the hand. The upper lip and hand were lasered. The patient tolerated the procedure well and there were no complications. The patient is to follow up in 4 weeks.

## 2020-08-21 NOTE — Telephone Encounter (Signed)
Patient returning call for echo results. 

## 2020-08-21 NOTE — Telephone Encounter (Signed)
Called patient, LVM to call back for results.  Left call back number .

## 2020-08-24 NOTE — Telephone Encounter (Signed)
Dana Dresser, MD  08/17/2020  8:26 AM EDT     No significant change in her echo. Function is similar. Size of aorta is noted at slightly larger, but this may just be a function of the angle of the picture. Not severe, repeat echo in 1 year to monitor.    Spoke with patient regarding the following results. Patient made aware and patient verbalized understanding.  Order for repeat Echo in one year placed.

## 2020-08-24 NOTE — Telephone Encounter (Signed)
Patient was returning call 

## 2020-08-25 ENCOUNTER — Ambulatory Visit (INDEPENDENT_AMBULATORY_CARE_PROVIDER_SITE_OTHER): Payer: Medicare PPO

## 2020-08-25 DIAGNOSIS — I428 Other cardiomyopathies: Secondary | ICD-10-CM | POA: Diagnosis not present

## 2020-08-25 LAB — CUP PACEART REMOTE DEVICE CHECK
Battery Remaining Longevity: 72 mo
Battery Voltage: 2.97 V
Brady Statistic AP VP Percent: 0.69 %
Brady Statistic AP VS Percent: 0.03 %
Brady Statistic AS VP Percent: 97.49 %
Brady Statistic AS VS Percent: 1.79 %
Brady Statistic RA Percent Paced: 0.72 %
Brady Statistic RV Percent Paced: 7.16 %
Date Time Interrogation Session: 20220815222151
Implantable Lead Implant Date: 20130110
Implantable Lead Implant Date: 20130110
Implantable Lead Implant Date: 20130110
Implantable Lead Location: 753858
Implantable Lead Location: 753859
Implantable Lead Location: 753860
Implantable Lead Model: 4196
Implantable Lead Model: 5076
Implantable Lead Model: 6935
Implantable Pulse Generator Implant Date: 20181115
Lead Channel Impedance Value: 1311 Ohm
Lead Channel Impedance Value: 323 Ohm
Lead Channel Impedance Value: 380 Ohm
Lead Channel Impedance Value: 608 Ohm
Lead Channel Impedance Value: 684 Ohm
Lead Channel Impedance Value: 798 Ohm
Lead Channel Impedance Value: 817 Ohm
Lead Channel Impedance Value: 836 Ohm
Lead Channel Impedance Value: 931 Ohm
Lead Channel Pacing Threshold Amplitude: 0.625 V
Lead Channel Pacing Threshold Amplitude: 1.5 V
Lead Channel Pacing Threshold Amplitude: 2.375 V
Lead Channel Pacing Threshold Pulse Width: 0.4 ms
Lead Channel Pacing Threshold Pulse Width: 0.4 ms
Lead Channel Pacing Threshold Pulse Width: 0.8 ms
Lead Channel Sensing Intrinsic Amplitude: 1.5 mV
Lead Channel Sensing Intrinsic Amplitude: 1.5 mV
Lead Channel Sensing Intrinsic Amplitude: 17.375 mV
Lead Channel Sensing Intrinsic Amplitude: 17.375 mV
Lead Channel Setting Pacing Amplitude: 2 V
Lead Channel Setting Pacing Amplitude: 2.75 V
Lead Channel Setting Pacing Amplitude: 4 V
Lead Channel Setting Pacing Pulse Width: 0.8 ms
Lead Channel Setting Pacing Pulse Width: 1 ms
Lead Channel Setting Sensing Sensitivity: 0.9 mV

## 2020-09-01 ENCOUNTER — Ambulatory Visit (HOSPITAL_BASED_OUTPATIENT_CLINIC_OR_DEPARTMENT_OTHER): Payer: Medicare PPO | Admitting: Obstetrics & Gynecology

## 2020-09-02 ENCOUNTER — Ambulatory Visit (HOSPITAL_BASED_OUTPATIENT_CLINIC_OR_DEPARTMENT_OTHER): Payer: Medicare PPO | Admitting: Obstetrics & Gynecology

## 2020-09-02 ENCOUNTER — Other Ambulatory Visit: Payer: Self-pay

## 2020-09-02 ENCOUNTER — Encounter (HOSPITAL_BASED_OUTPATIENT_CLINIC_OR_DEPARTMENT_OTHER): Payer: Self-pay | Admitting: Obstetrics & Gynecology

## 2020-09-02 VITALS — BP 126/54 | HR 57 | Ht 67.5 in | Wt 160.6 lb

## 2020-09-02 DIAGNOSIS — N390 Urinary tract infection, site not specified: Secondary | ICD-10-CM | POA: Diagnosis not present

## 2020-09-02 DIAGNOSIS — L900 Lichen sclerosus et atrophicus: Secondary | ICD-10-CM

## 2020-09-02 DIAGNOSIS — N952 Postmenopausal atrophic vaginitis: Secondary | ICD-10-CM | POA: Diagnosis not present

## 2020-09-02 DIAGNOSIS — Z90710 Acquired absence of both cervix and uterus: Secondary | ICD-10-CM

## 2020-09-02 DIAGNOSIS — D0512 Intraductal carcinoma in situ of left breast: Secondary | ICD-10-CM

## 2020-09-02 MED ORDER — NITROFURANTOIN MACROCRYSTAL 100 MG PO CAPS: 100.0000 mg | ORAL_CAPSULE | ORAL | 1 refills | Status: DC | PRN

## 2020-09-02 NOTE — Progress Notes (Signed)
65 y.o. G0 Married White or Caucasian female as new patient.  I saw her years ago and did her hysterectomy around 2009.  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  Pathology reviewed.  Lesion was ER +.   Had radiation and is on anastrozole.  Has bone density ordered.  Will have MMG done at the same.  Does have some vaginal bleeding with intercourse.  She has a lot of vaginal dryness as well.  Pt reports hx of lichen sclerosus.  Reviewed all pathology reports and biopsy in 2009 noted at time of hysterectomy.  Does not have any vulvar itching.  Not using anything for topical treatment/maintenance.  Pt reports h/o recurrent UTIs.  Reports she's has six or seven this year.  She saw a urologist at [place].  I do not have any records.  Reports she was advised to take cranberry tablets.  She was referred to [facility] for [facility] touch and had one treatment.  Reports this caused significant Schmidt so didn't have another one done.  Also saw Dr. Jerline Schmidt, [place] at [facility].  Was advised to use vaginal estrogen to help.  Note reviewed.  Also given rx for macrobid postcoitally.  Pt reports very few of her UTIs are related to intercourse so this hasn't been helpful.  Also, with h/o breast disease, just didn't feel comfortable with this suggestion.    No LMP recorded. Patient has had a hysterectomy.          Sexually active: Yes.    H/O STD:  no  Health Maintenance: PCP:  Dana Rota, NP.  Last wellness appt was this summer.  Did blood work at that appt:  yes Vaccines are up to date:  yes Colonoscopy:  pt 5 years ago with Dr. Cristina Schmidt MMG:  11/27/2019 Diagnostic BMD:  11/10/2018, has ordered due to anastrazole use Last pap smear:  04/28/2004 Negative.   H/o abnormal pap smear:  no   reports that she has never smoked. She has never used smokeless tobacco. She reports that she does not drink alcohol and does not use drugs.  Past Medical History:  Diagnosis Date   AF  (paroxysmal atrial fibrillation) ([place])    Arthritis    Basal cell carcinoma 11/11/1998   left forehead-tx dr [name]   [facility] (basal cell carcinoma) 03/11/2003   left distal lower leg ankle-tx dr [name]   [facility] (basal cell carcinoma) 11/19/2009   left anterior lat distal lower leg   Biventricular cardiac pacemaker in situ    a. prior CRT-D in 2013 with downgrade to CRT-Pacemaker only in 11/2016.   Breast cancer ([place]) 53/29/9242   Chronic systolic CHF (congestive heart failure) (HCC)    Complication of anesthesia    aspiration   Essential tremor    Fibroids    GERD (gastroesophageal reflux disease)    otc   Hypertension    Hyperthyroidism    Hypothyroidism    LBBB (left bundle branch block)    conduction disorder   Migraines    Mild aortic insufficiency    NICM (nonischemic cardiomyopathy) ([place])    a. 2012: normal coronaries, EF 25-30%. b. improved to 55% 11/2016.   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/14/2004   right dorsal lateral upper nose-mohs    Past Surgical History:  Procedure Laterality Date   ARTERY BIOPSY  12/29/2011   Procedure: MINOR BIOPSY TEMPORAL ARTERY;  Surgeon: Dana Jolly, MD;  Location: [place];  Service: General;  Laterality: Right;  Right temporal artery biopsy   BACK SURGERY  12/31/2009   "for flat back syndrome; fused bottom of my back"   BI-VENTRICULAR IMPLANTABLE CARDIOVERTER DEFIBRILLATOR Left 01/20/2011   Procedure: BI-VENTRICULAR IMPLANTABLE CARDIOVERTER DEFIBRILLATOR  (CRT-D);  Surgeon: Dana Lance, MD;  Location: [facility] CATH LAB;  Service:  Cardiovascular;  Laterality: Left;   BIV ICD GENERATOR CHANGEOUT N/A 11/24/2016   Procedure: BIV ICD GENERATOR CHANGEOUT;  Surgeon: Dana Lance, MD;  Location: [place] CV LAB;  Service: Cardiovascular;  Laterality: N/A;   BREAST LUMPECTOMY WITH RADIOACTIVE SEED LOCALIZATION Left 01/22/2020   Procedure: LEFT BREAST LUMPECTOMY WITH RADIOACTIVE SEED LOCALIZATION;  Surgeon: Dana Mesa, MD;  Location: [place];  Service: General;  Laterality: Left;  LMA   CHOLECYSTECTOMY  ~1996   FOOT NEUROMA SURGERY  ~ 2003   right   RE-EXCISION OF BREAST LUMPECTOMY Left 03/03/2020   Procedure: RE-EXCISION MARGINS OF LEFT BREAST LUMPECTOMY;  Surgeon: Dana Mesa, MD;  Location: [place];  Service: General;  Laterality: Left;   ROTATOR CUFF REPAIR  ~ 2009   left   SHOULDER ARTHROSCOPY W/ ROTATOR CUFF REPAIR  09/21/2011   SKIN CANCER EXCISION     nose, forehead, left leg   TEE WITHOUT CARDIOVERSION N/A 03/01/2017   Procedure: TRANSESOPHAGEAL ECHOCARDIOGRAM (TEE);  Surgeon: Dana Latch, MD;  Location: [place];  Service: Cardiovascular;  Laterality: N/A;   THYROID LOBECTOMY  ~ 2006   right   TUBAL LIGATION  ~ [place]  ~ 2009   VESICOVAGINAL FISTULA CLOSURE W/ TAH      Current Outpatient Medications  Medication Sig Dispense Refill   acetaminophen (TYLENOL) 325 MG tablet Take 650 mg by mouth as needed.     amitriptyline (ELAVIL) 25 MG tablet Take $RemoveBef'25mg'[id number]$  by mouth every night  5   anastrozole (ARIMIDEX) 1 MG tablet Take 1 tablet (1 mg total) by mouth daily. 90 tablet 3   CALCIUM PO Take 2 tablets by mouth daily.     Carboxymethylcellul-Glycerin (LUBRICATING EYE DROPS OP) Place 1 drop into both eyes 2 (two) times daily.      cetirizine (ZYRTEC) 10 MG tablet Take 10 mg by mouth daily.     Cranberry 400 MG CAPS Take 2 capsules by mouth daily in the afternoon.     Cyanocobalamin 1000 MCG/ML KIT Inject 1,000 mcg every 30 (thirty) days as  directed.     cyclobenzaprine (FLEXERIL) 10 MG tablet 1 tablet     eletriptan (RELPAX) 40 MG tablet Take 40 mg by mouth daily as needed for migraine. May repeat in 2 hours if necessary     ELIQUIS 5 MG TABS tablet TAKE 1 TABLET BY MOUTH TWICE A DAY 180 tablet 1   gabapentin (NEURONTIN) 600 MG tablet Take 600 mg by mouth 2 (two) times daily.     levothyroxine (SYNTHROID, LEVOTHROID) 100 MCG tablet Take 100 mcg daily before breakfast by mouth.     Melatonin 5 MG CAPS Take 5 mg by  mouth at bedtime.     Menthol (BIOFREEZE) 10 % AERO      nitrofurantoin (MACRODANTIN) 100 MG capsule Take 100 mg by mouth as needed (After Sexual Intercourse).     Omega-3 Fatty Acids (FISH OIL) 1000 MG CAPS 1 capsule     polyethylene glycol (MIRALAX / GLYCOLAX) packet Take 17 g every evening by mouth.     propranolol (INDERAL) 60 MG tablet Take 60 mg by mouth daily.     pseudoephedrine (SUDAFED) 30 MG tablet Take 30 mg by mouth as needed for congestion.     spironolactone (ALDACTONE) 25 MG tablet Take 0.5 tablets (12.5 mg total) by mouth daily. 45 tablet 3   VITAMIN D PO Take 2 capsules by mouth daily.     No current facility-administered medications for this visit.    Family History  Problem Relation Age of Onset   Heart disease Mother    Heart failure Mother 68       CHF   Stroke Mother    Hypertension Mother    Heart disease Father 71   Macular degeneration Father    Heart disease Sister    Atrial fibrillation Sister    Heart disease Sister     Review of Systems  Exam:   BP (!) 126/54 (BP Location: Right Arm, Patient Position: Sitting, Cuff Size: Small)   Pulse (!) 57   Ht 5' 7.5" (1.715 m)   Wt 160 lb 9.6 oz (72.8 kg)   BMI 24.78 kg/m   Height: 5' 7.5" (171.5 cm)  General appearance: alert, cooperative and appears stated age Breasts: normal appearance, no masses or tenderness Abdomen: soft, non-tender; bowel sounds normal; no masses,  no organomegaly Lymph nodes: Cervical, supraclavicular,  and axillary nodes normal.  No abnormal inguinal nodes palpated Neurologic: Grossly normal  Pelvic: External genitalia:  no lesions, no visible skin changes, no hypopigmentation              Urethra:  normal appearing urethra with no masses, tenderness or lesions              Bartholins and Skenes: normal                 Vagina: normal appearing vagina with atrophic changes and no discharge, no lesions              Cervix: absent              Pap taken: No. Bimanual Exam:  Uterus:  uterus absent              Adnexa: no mass, fullness, tenderness               Rectovaginal: Confirms               Anus:  normal sphincter tone, no lesions  Chaperone, Octaviano Batty, CMA, was present for exam.  Assessment/Plan: 1. Recurrent UTI - pt is uncomfortable taking any estrogen at this time.  Has failed post coital UTI prophylaxis.  Will try suppressive therapy.  Has tried [employer], seen urology and urogyn. - nitrofurantoin (MACRODANTIN) 100 MG capsule; Take 1 capsule (100 mg total) by mouth as needed (After Sexual Intercourse).  Dispense: 90 capsule; Refill: 1  2. H/O: hysterectomy - 2009, ovaries remain  3. Ductal carcinoma in situ (DCIS) of left breast - s/p lumpectomy x 2, now on anastrozole  4. Lichen sclerosus - not definitive diagnosis with bx in 2009.  No vulvar skin changes today  so no treatment recommended for this  5. Postmenopausal atrophic vaginitis - feel pt will benefit from hyaluronic acid or vit E vaginal suppositories but she is having so many UTIs that I want to get that under better control before having pt start using something vaginally as well.  Pt comfortable with plan. - recheck 3 months

## 2020-09-05 ENCOUNTER — Encounter (HOSPITAL_BASED_OUTPATIENT_CLINIC_OR_DEPARTMENT_OTHER): Payer: Self-pay | Admitting: Obstetrics & Gynecology

## 2020-09-06 ENCOUNTER — Encounter (HOSPITAL_BASED_OUTPATIENT_CLINIC_OR_DEPARTMENT_OTHER): Payer: Self-pay | Admitting: Cardiology

## 2020-09-08 ENCOUNTER — Ambulatory Visit: Payer: Medicare PPO | Admitting: Physician Assistant

## 2020-09-08 ENCOUNTER — Encounter: Payer: Self-pay | Admitting: Physician Assistant

## 2020-09-08 ENCOUNTER — Other Ambulatory Visit: Payer: Self-pay

## 2020-09-08 ENCOUNTER — Other Ambulatory Visit: Payer: Self-pay | Admitting: Physician Assistant

## 2020-09-08 DIAGNOSIS — D485 Neoplasm of uncertain behavior of skin: Secondary | ICD-10-CM

## 2020-09-08 DIAGNOSIS — Z1283 Encounter for screening for malignant neoplasm of skin: Secondary | ICD-10-CM | POA: Diagnosis not present

## 2020-09-08 DIAGNOSIS — L821 Other seborrheic keratosis: Secondary | ICD-10-CM

## 2020-09-08 DIAGNOSIS — Z85828 Personal history of other malignant neoplasm of skin: Secondary | ICD-10-CM | POA: Diagnosis not present

## 2020-09-08 DIAGNOSIS — D0471 Carcinoma in situ of skin of right lower limb, including hip: Secondary | ICD-10-CM

## 2020-09-08 DIAGNOSIS — L570 Actinic keratosis: Secondary | ICD-10-CM | POA: Diagnosis not present

## 2020-09-08 DIAGNOSIS — K130 Diseases of lips: Secondary | ICD-10-CM | POA: Diagnosis not present

## 2020-09-08 MED ORDER — KETOCONAZOLE 2 % EX CREA
TOPICAL_CREAM | CUTANEOUS | 0 refills | Status: DC
Start: 2020-09-08 — End: 2023-01-27

## 2020-09-08 MED ORDER — MUPIROCIN 2 % EX OINT: TOPICAL_OINTMENT | CUTANEOUS | 0 refills | Status: DC

## 2020-09-08 NOTE — Progress Notes (Signed)
   Follow-Up Visit   Subjective  [name] is a 65 y.o. female who presents for the following: Annual Exam (Patient has a list to go over with [facility], patient has history of bcc and scc).   The following portions of the chart were reviewed this encounter and updated as appropriate:  Tobacco  Allergies  Meds  Problems  Med Hx  Surg Hx  Fam Hx      Objective  Well appearing patient in no apparent distress; mood and affect are within normal limits.  A full examination was performed including scalp, head, eyes, ears, nose, lips, neck, chest, axillae, abdomen, back, buttocks, bilateral upper extremities, bilateral lower extremities, hands, feet, fingers, toes, fingernails, and toenails. All findings within normal limits unless otherwise noted below.  Chest (Upper Torso, Anterior) Brown and light colored, crusty plaques.  Left Dorsal Hand (4), Left Superior Helix, Right Shoulder - Anterior Erythematous patches with gritty scale.  Right Lower Leg - Anterior Hyperkeratotic scale with pink base      Left and right Oral Commissure Left side- greater then Right-.Redness and marked inflammation.   Assessment & Plan  Seborrheic keratosis Chest (Upper Torso, Anterior)  observe  AK (actinic keratosis) (6) Right Shoulder - Anterior; Left Dorsal Hand (4); Left Superior Helix  Destruction of lesion - Left Dorsal Hand, Left Superior Helix, Right Shoulder - Anterior Complexity: simple   Destruction method: cryotherapy   Informed consent: discussed and consent obtained   Timeout:  patient name, date of birth, surgical site, and procedure verified Lesion destroyed using liquid nitrogen: Yes   Cryotherapy cycles:  3 Outcome: patient tolerated procedure well with no complications   Post-procedure details: wound care instructions given    Neoplasm of uncertain behavior of skin Right Lower Leg - Anterior  Skin / nail biopsy Type of biopsy: tangential   Informed consent:  discussed and consent obtained   Timeout: patient name, date of birth, surgical site, and procedure verified   Anesthesia: the lesion was anesthetized in a standard fashion   Anesthetic:  1% lidocaine w/ epinephrine 1-100,000 local infiltration Instrument used: flexible razor blade   Hemostasis achieved with: aluminum chloride and electrodesiccation   Outcome: patient tolerated procedure well   Post-procedure details: wound care instructions given    mupirocin ointment (BACTROBAN) 2 % Apply to leg once to twice daily  Specimen 1 - Surgical pathology Differential Diagnosis: bcc scc  Check Margins: No  Angular cheilitis Left and right Oral Commissure  Add OTC hydrocortisone -2-3 times per day.  ketoconazole (NIZORAL) 2 % cream - Left and right Oral Commissure Apply to corner of mouth nightly    I, Marlenne Ridge, PA-C, have reviewed all documentation's for this visit.  The documentation on 09/08/20 for the exam, diagnosis, procedures and orders are all accurate and complete.

## 2020-09-08 NOTE — Patient Instructions (Addendum)

## 2020-09-15 NOTE — Progress Notes (Signed)
Remote pacemaker transmission.   

## 2020-09-16 ENCOUNTER — Telehealth: Payer: Self-pay | Admitting: *Deleted

## 2020-09-16 NOTE — Telephone Encounter (Signed)
Pathology to patient- surgery appointment scheduled with [facility] per patient.

## 2020-09-17 ENCOUNTER — Other Ambulatory Visit: Payer: Self-pay

## 2020-09-17 ENCOUNTER — Ambulatory Visit (INDEPENDENT_AMBULATORY_CARE_PROVIDER_SITE_OTHER): Payer: Medicare PPO | Admitting: Plastic Surgery

## 2020-09-17 ENCOUNTER — Encounter: Payer: Self-pay | Admitting: Plastic Surgery

## 2020-09-17 DIAGNOSIS — Z719 Counseling, unspecified: Secondary | ICD-10-CM | POA: Insufficient documentation

## 2020-09-17 NOTE — Progress Notes (Signed)
[place]  Preoperative Dx: Hyperpigmentation of hands  Postoperative Dx:  same  Procedure: laser to hands  Anesthesia: none  Description of Procedure:  Risks and complications were explained to the patient. Consent was confirmed and signed. Time out was called and all information was confirmed to be correct. The area  area was prepped with alcohol and wiped dry. The BBL laser was set at 7 J/cm2. The dorsal hands were lasered. The patient tolerated the procedure well and there were no complications. The patient is to follow up in 4 weeks.

## 2020-09-18 DIAGNOSIS — H04123 Dry eye syndrome of bilateral lacrimal glands: Secondary | ICD-10-CM | POA: Diagnosis not present

## 2020-09-22 ENCOUNTER — Ambulatory Visit: Payer: Medicare PPO | Admitting: Plastic Surgery

## 2020-09-25 ENCOUNTER — Ambulatory Visit: Payer: Medicare PPO | Admitting: Orthopedic Surgery

## 2020-09-25 ENCOUNTER — Ambulatory Visit (INDEPENDENT_AMBULATORY_CARE_PROVIDER_SITE_OTHER): Payer: Medicare PPO

## 2020-09-25 ENCOUNTER — Other Ambulatory Visit: Payer: Self-pay

## 2020-09-25 DIAGNOSIS — M25512 Pain in left shoulder: Secondary | ICD-10-CM

## 2020-09-25 DIAGNOSIS — Z9889 Other specified postprocedural states: Secondary | ICD-10-CM

## 2020-09-26 ENCOUNTER — Encounter: Payer: Self-pay | Admitting: Orthopedic Surgery

## 2020-09-26 NOTE — Progress Notes (Signed)
Office Visit Note   Patient: [name]           Date of Birth: 24-Mar-1955           MRN: [id number] Visit Date: 09/25/2020 Requested by: [name], NP [place],  [place] [address] PCP: [name], NP  Subjective: Chief Complaint  Patient presents with   Left Shoulder - Pain    HPI: [name] is a 65 y.o. female who presents to the office complaining of left shoulder pain.  Patient states that she has had pain since a fall about 10 years ago.  She localizes pain to the anterior aspect of the shoulder with some radiation into the bicep.  No radicular pain down the arm, numbness/tingling, scapular pain but she does have occasional neck pain.  She has history of bilateral rotator cuff repair.  Right rotator cuff repair was by Dr. Marlou Sa with left rotator cuff repair in 2009 by Dr. Gladstone Lighter.  She complains of pain that is worse with moving her arm out away from her body and trying to lift things away from her body.  She is okay lifting things close to her body.  She wakes with pain 4/7 nights per week on average.  She has a catching and clicking sensation with moving the shoulder.  She finds it difficult to cook, unload her dishwasher, perform household chores due to the pain she has.  She does have no history of diabetes or smoking.  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  She has seen a spine surgeon about her neck and has been told "there is no surgical option"..                ROS: All systems reviewed are negative as they relate to the chief complaint within the history of present illness.  Patient denies fevers or chills.  Assessment & Plan: Visit Diagnoses:  1. Left shoulder pain, unspecified chronicity   2. History of repair of left rotator cuff     Plan: Patient is a 65 year old female who presents complaint of left shoulder pain.  She has history of left shoulder rotator cuff repair a little over a decade ago with persistent pain since a fall about 10  years ago.  This is been progressively worsening and is gotten to the point where she has mechanical symptoms with catching and clicking in the shoulder as well as pain that is waking her up at night more often.  She has asymmetric weakness of the rotator cuff on exam when compared with contralateral shoulder.  Plan to order MRI arthrogram of the left shoulder for further evaluation of potential retear of rotator cuff repair of the left shoulder.  She does have a pacemaker but reports this pacemaker is MRI compatible and she has had a prior MRI with her pacemaker before.  [employer] card was scanned and returned to the patient.  Follow-up after MRI to review results.  Additionally, patient would like to discuss her neck pain at her next appointment.  She has history of cervical spine MRI about 2 years ago that revealed cervical kyphoscoliosis with anterior listhesis at multiple levels as well as moderate foraminal narrowing bilaterally at C5-C6 and C6-C7.  Follow-Up Instructions: No follow-ups on file.   Orders:  Orders Placed This Encounter  Procedures   XR Shoulder Left   MR Shoulder Left w/ contrast   Arthrogram   No orders of the defined types were placed in this encounter.  Procedures: No procedures performed   Clinical Data: No additional findings.  Objective: Vital Signs: There were no vitals taken for this visit.  Physical Exam:  Constitutional: Patient appears well-developed HEENT:  Head: Normocephalic Eyes:EOM are normal Neck: Normal range of motion Cardiovascular: Normal rate Pulmonary/chest: Effort normal Neurologic: Patient is alert Skin: Skin is warm Psychiatric: Patient has normal mood and affect  Ortho Exam: Ortho exam demonstrates right shoulder with 80 degrees external rotation, 90 degrees abduction, 160 degrees forward flexion.  This compared with the left shoulder with 80 degrees external rotation, 90 degrees abduction, 150 degrees forward flexion.   Incisions are well-healed from prior shoulder surgeries.  Crepitus noted anteriorly with passive motion of the left shoulder.  Asymmetric weakness of external rotation and supraspinatus rated 4+/5 compared with the right shoulder rated 5/5.  Excellent subscapularis strength bilaterally.  Negative belly press test.  Negative Hornblower sign.  Tenderness in the bicipital groove of the left shoulder.  Mild tenderness throughout the axial cervical spine.  Negative Spurling sign bilaterally.  5/5 motor strength of bilateral grip strength, finger abduction, pronation/supination, bicep, tricep, deltoid.  Specialty Comments:  No specialty comments available.  Imaging: No results found.   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   Trigeminal neuralgia 12/14/2012   Insomnia, persistent 11/03/2011   AICD (automatic cardioverter/defibrillator) present    Aortic regurgitation    Nonischemic cardiomyopathy (HCC) 01/21/2011   Esophagitis, reflux 08/24/2010   Idiopathic peripheral neuropathy 08/24/2010   Paroxysmal atrial fibrillation ([place]) 02/23/2010   Hypothyroidism    History of migraine headaches    Left bundle branch block    Scoliosis     Class: Chronic   Lumbar canal stenosis 12/25/2009   Anxiety state 07/24/2008   Avitaminosis D 03/21/2008   Atypical migraine 01/30/2008   Benign essential HTN 01/30/2008   Paroxysmal digital cyanosis 02/15/2007   Past Medical History:  Diagnosis Date   AF (paroxysmal atrial fibrillation) ([place])    Arthritis    BCC (basal cell carcinoma) 03/11/2003   left distal lower leg ankle-tx dr Tonia Brooms   Biventricular cardiac pacemaker in situ    a. prior  CRT-D in 2013 with downgrade to CRT-Pacemaker only in 11/2016.   Breast cancer ([place]) [id number]   Chronic systolic CHF (congestive heart failure) (HCC)    Complication of anesthesia    aspiration   Essential tremor    GERD (gastroesophageal reflux disease)    otc   Hypertension    Hypothyroidism    LBBB (left bundle branch block)    conduction disorder   Migraines    Mild aortic insufficiency    NICM (nonischemic cardiomyopathy) ([place])    a. 2012: normal coronaries, EF 25-30%. b. improved to 55% 11/2016.   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   right upper lip-mohs Dr.mitkov   Scoliosis     Family History  Problem Relation Age of Onset   Heart disease Mother    Heart failure Mother 60       CHF   Stroke Mother  Hypertension Mother    Heart disease Father 66   Macular degeneration Father    Heart disease Sister    Atrial fibrillation Sister    Heart disease Sister     Past Surgical History:  Procedure Laterality Date   ARTERY BIOPSY  12/29/2011   Procedure: MINOR BIOPSY TEMPORAL ARTERY;  Surgeon: Edward Jolly, MD;  Location: [facility];  Service: General;  Laterality: Right;  Right temporal artery biopsy   BACK SURGERY  12/31/2009   "for flat back syndrome; fused bottom of my back"   BI-VENTRICULAR IMPLANTABLE CARDIOVERTER DEFIBRILLATOR Left 01/20/2011   Procedure: BI-VENTRICULAR IMPLANTABLE CARDIOVERTER DEFIBRILLATOR  (CRT-D);  Surgeon: Evans Lance, MD;  Location[facility] CATH LAB;  Service: Cardiovascular;  Laterality: Left;   BIV ICD GENERATOR CHANGEOUT N/A 11/24/2016   Procedure: BIV ICD GENERATOR CHANGEOUT;  Surgeon: Evans Lance, MD;  Location: [place] CV LAB;  Service: Cardiovascular;  Laterality: N/A;   BREAST LUMPECTOMY WITH RADIOACTIVE SEED LOCALIZATION Left 01/22/2020   Procedure: LEFT BREAST  LUMPECTOMY WITH RADIOACTIVE SEED LOCALIZATION;  Surgeon: Donnie Mesa, MD;  Location: [place];  Service: General;  Laterality: Left;  LMA   CHOLECYSTECTOMY  ~1996   FOOT NEUROMA SURGERY  ~ 2003   right   RE-EXCISION OF BREAST LUMPECTOMY Left 03/03/2020   Procedure: RE-EXCISION MARGINS OF LEFT BREAST LUMPECTOMY;  Surgeon: Donnie Mesa, MD;  Location: [place];  Service: General;  Laterality: Left;   ROTATOR CUFF REPAIR  ~ 2009   left   SHOULDER ARTHROSCOPY W/ ROTATOR CUFF REPAIR Right 09/21/2011   SKIN CANCER EXCISION     nose, forehead, left leg   TEE WITHOUT CARDIOVERSION N/A 03/01/2017   Procedure: TRANSESOPHAGEAL ECHOCARDIOGRAM (TEE);  Surgeon: Skeet Latch, MD;  Location: [place];  Service: Cardiovascular;  Laterality: N/A;   THYROID LOBECTOMY  ~ 2006   right   TUBAL LIGATION  ~ [place]  ~ 2009   VESICOVAGINAL FISTULA CLOSURE W/ TAH     Social History   Occupational History   Occupation: Pharmacist, hospital    Comment: n/a  Tobacco Use   Smoking status: Never   Smokeless tobacco: Never  Vaping Use   Vaping Use: Never used  Substance and Sexual Activity   Alcohol use: No   Drug use: No   Sexual activity: Yes

## 2020-09-30 DIAGNOSIS — L603 Nail dystrophy: Secondary | ICD-10-CM | POA: Diagnosis not present

## 2020-09-30 DIAGNOSIS — I739 Peripheral vascular disease, unspecified: Secondary | ICD-10-CM | POA: Diagnosis not present

## 2020-09-30 DIAGNOSIS — L84 Corns and callosities: Secondary | ICD-10-CM | POA: Diagnosis not present

## 2020-10-09 DIAGNOSIS — F325 Major depressive disorder, single episode, in full remission: Secondary | ICD-10-CM | POA: Diagnosis not present

## 2020-10-09 DIAGNOSIS — I4891 Unspecified atrial fibrillation: Secondary | ICD-10-CM | POA: Diagnosis not present

## 2020-10-09 DIAGNOSIS — F419 Anxiety disorder, unspecified: Secondary | ICD-10-CM | POA: Diagnosis not present

## 2020-10-09 DIAGNOSIS — I110 Hypertensive heart disease with heart failure: Secondary | ICD-10-CM | POA: Diagnosis not present

## 2020-10-09 DIAGNOSIS — I509 Heart failure, unspecified: Secondary | ICD-10-CM | POA: Diagnosis not present

## 2020-10-09 DIAGNOSIS — I739 Peripheral vascular disease, unspecified: Secondary | ICD-10-CM | POA: Diagnosis not present

## 2020-10-09 DIAGNOSIS — D6869 Other thrombophilia: Secondary | ICD-10-CM | POA: Diagnosis not present

## 2020-10-09 DIAGNOSIS — I748 Embolism and thrombosis of other arteries: Secondary | ICD-10-CM | POA: Diagnosis not present

## 2020-10-09 DIAGNOSIS — E039 Hypothyroidism, unspecified: Secondary | ICD-10-CM | POA: Diagnosis not present

## 2020-10-15 ENCOUNTER — Encounter (HOSPITAL_BASED_OUTPATIENT_CLINIC_OR_DEPARTMENT_OTHER): Payer: Self-pay | Admitting: *Deleted

## 2020-10-21 ENCOUNTER — Encounter (HOSPITAL_BASED_OUTPATIENT_CLINIC_OR_DEPARTMENT_OTHER): Payer: Self-pay | Admitting: Cardiology

## 2020-10-21 ENCOUNTER — Other Ambulatory Visit: Payer: Self-pay

## 2020-10-21 ENCOUNTER — Ambulatory Visit (HOSPITAL_BASED_OUTPATIENT_CLINIC_OR_DEPARTMENT_OTHER): Payer: Medicare PPO | Admitting: Cardiology

## 2020-10-21 VITALS — BP 102/70 | HR 64 | Ht 67.5 in | Wt 161.6 lb

## 2020-10-21 DIAGNOSIS — J9811 Atelectasis: Secondary | ICD-10-CM

## 2020-10-21 DIAGNOSIS — Z950 Presence of cardiac pacemaker: Secondary | ICD-10-CM

## 2020-10-21 DIAGNOSIS — I480 Paroxysmal atrial fibrillation: Secondary | ICD-10-CM | POA: Diagnosis not present

## 2020-10-21 DIAGNOSIS — I428 Other cardiomyopathies: Secondary | ICD-10-CM | POA: Diagnosis not present

## 2020-10-21 DIAGNOSIS — Z7901 Long term (current) use of anticoagulants: Secondary | ICD-10-CM | POA: Diagnosis not present

## 2020-10-21 DIAGNOSIS — R0609 Other forms of dyspnea: Secondary | ICD-10-CM | POA: Diagnosis not present

## 2020-10-21 DIAGNOSIS — R600 Localized edema: Secondary | ICD-10-CM | POA: Diagnosis not present

## 2020-10-21 DIAGNOSIS — I447 Left bundle-branch block, unspecified: Secondary | ICD-10-CM | POA: Diagnosis not present

## 2020-10-21 NOTE — Patient Instructions (Signed)
Medication Instructions:    Your Physician recommend you continue on your current medication as directed.    *If you need a refill on your cardiac medications before your next appointment, please call your pharmacy*   Lab Work: None ordered today   Testing/Procedures: None ordered today   Follow-Up: At [facility], you and your health needs are our priority.  As part of our continuing mission to provide you with exceptional heart care, we have created designated Provider Care Teams.  These Care Teams include your primary Cardiologist (physician) and Advanced Practice Providers (APPs -  Physician Assistants and Nurse Practitioners) who all work together to provide you with the care you need, when you need it.  We recommend signing up for the patient portal called "MyChart".  Sign up information is provided on this After Visit Summary.  MyChart is used to connect with patients for Virtual Visits (Telemedicine).  Patients are able to view lab/test results, encounter notes, upcoming appointments, etc.  Non-urgent messages can be sent to your provider as well.   To learn more about what you can do with MyChart, go to [email or web address].    Your next appointment:   6 month(s)  The format for your next appointment:   In Person  Provider:   Buford Dresser, MD   Other Instructions I will send my note to your primary care--I think lung function tests would be helpful.

## 2020-10-21 NOTE — Progress Notes (Signed)
Cardiology Office Note:    Date:  11/09/2020   ID:  [name], DOB Sep 12, 1955, MRN [id number]  PCP:  [name], NP  Cardiologist:  Buford Dresser, MD  Referring MD: [name], NP   CC: follow up  History of Present Illness:    [name] is a 65 y.o. female with a hx of LBBB, NICM s/p CRT-D-->P with normalization of EF, paroxysmal atrial fibrillation.   Today: Overall she is feeling all right. Since her last visit she continues to become short of breath easily. Her breathing is largely unchanged. When lying down at night or while coughing, she can hear a "whistle" sound when she breathes.  In the past she has never been told that she has asthma. She endorses seasonal allergies, worse at this time of year. Normally she treats her allergies with OTC medication when her prescriptions run out.  Since starting 1/2 tablet of spironolactone she denies any very low blood pressures or associated symptoms.  Recently she started a new nasal spray, which has helped her dry eyes but causes some drainage.  She denies any palpitations, or chest pain. No lightheadedness, headaches, syncope, orthopnea, or PND. Also has no lower extremity edema or exertional symptoms.   Past Medical History:  Diagnosis Date   AF (paroxysmal atrial fibrillation) (HCC)    Arthritis    BCC (basal cell carcinoma) 03/11/2003   left distal lower leg ankle-tx dr Tonia Brooms   Biventricular cardiac pacemaker in situ    a. prior CRT-D in 2013 with downgrade to CRT-Pacemaker only in 11/2016.   Breast cancer ([place]) 50/53/9767   Chronic systolic CHF (congestive heart failure) (HCC)    Complication of anesthesia    aspiration   Essential tremor    GERD (gastroesophageal reflux disease)    otc   Hypertension    Hypothyroidism    LBBB (left bundle branch block)    conduction disorder   Migraines    Mild aortic insufficiency    NICM (nonischemic cardiomyopathy) ([place])    a. 2012: normal  coronaries, EF 25-30%. b. improved to 55% 11/2016.   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   right upper lip-mohs Dr.mitkov   Scoliosis     Past Surgical History:  Procedure Laterality Date   ARTERY BIOPSY  12/29/2011   Procedure: MINOR BIOPSY TEMPORAL ARTERY;  Surgeon: Edward Jolly, MD;  Location: [place];  Service: General;  Laterality: Right;  Right temporal artery biopsy   BACK SURGERY  12/31/2009   "for flat back syndrome; fused bottom of my back"   BI-VENTRICULAR IMPLANTABLE CARDIOVERTER DEFIBRILLATOR Left 01/20/2011   Procedure: BI-VENTRICULAR IMPLANTABLE CARDIOVERTER DEFIBRILLATOR  (CRT-D);  Surgeon: Evans Lance, MD;  Location: [facility];  Service: Cardiovascular;  Laterality: Left;   BIV ICD GENERATOR CHANGEOUT N/A 11/24/2016   Procedure: BIV ICD GENERATOR CHANGEOUT;  Surgeon: Evans Lance, MD;  Location: [place] CV [facility];  Service: Cardiovascular;  Laterality: N/A;   BREAST LUMPECTOMY WITH RADIOACTIVE SEED LOCALIZATION Left 01/22/2020   Procedure: LEFT BREAST LUMPECTOMY WITH RADIOACTIVE SEED LOCALIZATION;  Surgeon: Donnie Mesa, MD;  Location: [place];  Service: General;  Laterality: Left;  LMA   CHOLECYSTECTOMY  ~1996   FOOT NEUROMA SURGERY  ~ 2003   right   RE-EXCISION OF BREAST LUMPECTOMY Left 03/03/2020  Procedure: RE-EXCISION MARGINS OF LEFT BREAST LUMPECTOMY;  Surgeon: Donnie Mesa, MD;  Location: [place];  Service: General;  Laterality: Left;   ROTATOR CUFF REPAIR  ~ 2009   left   SHOULDER ARTHROSCOPY W/ ROTATOR CUFF REPAIR Right 09/21/2011   SKIN CANCER EXCISION     nose, forehead, left leg   TEE WITHOUT CARDIOVERSION N/A 03/01/2017   Procedure: TRANSESOPHAGEAL ECHOCARDIOGRAM (TEE);  Surgeon: Skeet Latch, MD;  Location: [place];  Service:  Cardiovascular;  Laterality: N/A;   THYROID LOBECTOMY  ~ 2006   right   TUBAL LIGATION  ~ [place]  ~ 2009   VESICOVAGINAL FISTULA CLOSURE W/ TAH      Current Medications: Current Outpatient Medications on File Prior to Visit  Medication Sig   acetaminophen (TYLENOL) 325 MG tablet Take 650 mg by mouth as needed.   amitriptyline (ELAVIL) 25 MG tablet Take 59m by mouth every night   anastrozole (ARIMIDEX) 1 MG tablet Take 1 tablet (1 mg total) by mouth daily.   CALCIUM PO Take 2 tablets by mouth daily.   cetirizine (ZYRTEC) 10 MG tablet Take 10 mg by mouth daily.   Cyanocobalamin 1000 MCG/ML KIT Inject 1,000 mcg every 30 (thirty) days as directed.   cyclobenzaprine (FLEXERIL) 10 MG tablet 1 tablet   eletriptan (RELPAX) 40 MG tablet Take 40 mg by mouth daily as needed for migraine. May repeat in 2 hours if necessary   ELIQUIS 5 MG TABS tablet TAKE 1 TABLET BY MOUTH TWICE A DAY   gabapentin (NEURONTIN) 600 MG tablet Take 600 mg by mouth 2 (two) times daily.   ketoconazole (NIZORAL) 2 % cream Apply to corner of mouth nightly   levothyroxine (SYNTHROID, LEVOTHROID) 100 MCG tablet Take 100 mcg daily before breakfast by mouth.   Melatonin 5 MG CAPS Take 5 mg by mouth at bedtime.   Menthol (BIOFREEZE) 10 % AERO    mupirocin ointment (BACTROBAN) 2 % Apply to leg once to twice daily   nitrofurantoin (MACRODANTIN) 100 MG capsule Take 1 capsule (100 mg total) by mouth as needed (After Sexual Intercourse).   Omega-3 Fatty Acids (FISH OIL) 1000 MG CAPS 1 capsule   polyethylene glycol (MIRALAX / GLYCOLAX) packet Take 17 g every evening by mouth.   propranolol (INDERAL) 60 MG tablet Take 60 mg by mouth daily.   pseudoephedrine (SUDAFED) 30 MG tablet Take 30 mg by mouth as needed for congestion.   spironolactone (ALDACTONE) 25 MG tablet Take 0.5 tablets (12.5 mg total) by mouth daily.   Varenicline Tartrate (TYRVAYA) 0.03 MG/ACT SOLN Place into the nose.   VITAMIN D PO Take 2  capsules by mouth daily.   No current facility-administered medications on file prior to visit.     Allergies:   Other, Tape, Codeine, Fentanyl, and Morphine and related   Social History   Tobacco Use   Smoking status: Never   Smokeless tobacco: Never  Vaping Use   Vaping Use: Never used  Substance Use Topics   Alcohol use: No   Drug use: No    Family History: family history includes Atrial fibrillation in her sister; Heart disease in her mother, sister, and sister; Heart disease (age of onset: 971 in her father; Heart failure (age of onset: 85 in her mother; Hypertension in her mother; Macular degeneration in her father; Stroke in her mother.  ROS:   Please see the history of present illness.   (+) Shortness of breath (+) Seasonal allergies  Additional pertinent ROS otherwise unremarkable.  EKGs/Labs/Other Studies Reviewed:    The following studies were reviewed today:  Echo 08/12/2020:   1. Abnormal septal motion EF and GLS stable since 08/14/19 . Left  ventricular ejection fraction, by estimation, is 50 to 55%. The left  ventricle has low normal function. The left ventricle has no regional wall  motion abnormalities. Left ventricular  diastolic parameters were normal. The average left ventricular global  longitudinal strain is -22.9 %. The global longitudinal strain is normal.   2. Right ventricular systolic function is normal. The right ventricular  size is normal. There is normal pulmonary artery systolic pressure.   3. The pericardial effusion is posterior to the left ventricle.   4. The mitral valve is normal in structure. No evidence of mitral valve  regurgitation. No evidence of mitral stenosis.   5. The aortic valve is tricuspid. Aortic valve regurgitation is mild. No  aortic stenosis is present.   6. Aortic dilatation noted. There is mild dilatation of the ascending  aorta, measuring 39 mm.   7. The inferior vena cava is normal in size with greater than 50%   respiratory variability, suggesting right atrial pressure of 3 mmHg.   Comparison(s): 08/14/19 EF 50-55%.   Echo 08/14/19 1. Left ventricular ejection fraction, by estimation, is 50 to 55%. The  left ventricle has low normal function. The left ventricle has no regional  wall motion abnormalities. Left ventricular diastolic parameters are  consistent with Grade I diastolic  dysfunction (impaired relaxation). The average left ventricular global  longitudinal strain is -25.1 %.   2. Right ventricular systolic function is normal. The right ventricular  size is normal. There is normal pulmonary artery systolic pressure.   3. The mitral valve is normal in structure. No evidence of mitral valve  regurgitation. No evidence of mitral stenosis.   4. The aortic valve is normal in structure. Aortic valve regurgitation is  mild. No aortic stenosis is present.   5. Aortic root/ascending aorta has been repaired/replaced. 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   Comparison(s): 02/26/17 EF 60-65%. PA pressure 61mHg.   TEE 02/2017 - Left ventricle: Systolic function was normal. Wall motion was   normal; there were no regional wall motion abnormalities. - Aortic valve: There was mild regurgitation. - Mitral valve: There was trivial regurgitation. - Left atrium: No evidence of thrombus in the atrial cavity or   appendage. No evidence of thrombus in the atrial cavity or   appendage. - Right ventricle: The cavity size was normal. Wall thickness was   normal. Systolic function was normal. - Right atrium: No evidence of thrombus in the atrial cavity or   appendage. - Atrial septum: A patent foramen ovale is likely. There was no   right to left flow at rest but saline microcavitation study was   positive with abdominal pressure. The test was repeated to ensure   that there was no extracardiac shunt and was negative. - Tricuspid valve: There was trivial regurgitation.    TTE 02/2017 Study Conclusions   - Left ventricle: The cavity size was normal. Wall thickness was   normal. Systolic function was normal. The estimated ejection   fraction was in the range of 60% to 65%. Wall motion was normal;   there were no regional wall motion abnormalities. Doppler   parameters are consistent with abnormal left ventricular   relaxation (grade 1 diastolic dysfunction). The E/e&' ratio is <8,   suggesting  normal LV filling pressure. - Aortic valve: Sclerosis without stenosis. There was trivial   regurgitation. - Left atrium: The atrium was normal in size. - Tricuspid valve: There was trivial regurgitation. - Pulmonary arteries: PA peak pressure: 30 mm Hg (S). - Inferior vena cava: The vessel was normal in size. The   respirophasic diameter changes were in the normal range (>= 50%),   consistent with normal central venous pressure.   Impressions:   - Compared to a prior study in 2018, the LVEF is higher at 60-65%   and there is now grade 1 DD with normal LV filling pressure.  EKG:  EKG is personally reviewed.   10/21/2020: not ordered 07/27/2020: atrial-sensed, ventricular-paced rhythm at 56 bpm 08/01/19: atrial-sensed, ventricular-paced rhythm at 73 bpm  Recent Labs: 02/28/2020: BUN 18; Creatinine, Ser 0.93; Potassium 4.4; Sodium 138  Recent Lipid Panel    Component Value Date/Time   CHOL  02/11/2010 0555    103        ATP III CLASSIFICATION:  <200     mg/dL   Desirable  200-239  mg/dL   Borderline High  >=240    mg/dL   High          TRIG 85 02/11/2010 0555   HDL 42 02/11/2010 0555   CHOLHDL 2.5 02/11/2010 0555   VLDL 17 02/11/2010 0555   LDLCALC  02/11/2010 0555    44        Total Cholesterol/HDL:CHD Risk Coronary Heart Disease Risk Table                     Men   Women  1/2 Average Risk   3.4   3.3  Average Risk       5.0   4.4  2 X Average Risk   9.6   7.1  3 X Average Risk  23.4   11.0        Use the calculated Patient Ratio above and the  CHD Risk Table to determine the patient's CHD Risk.        ATP III CLASSIFICATION (LDL):  <100     mg/dL   Optimal  100-129  mg/dL   Near or Above                    Optimal  130-159  mg/dL   Borderline  160-189  mg/dL   High  >190     mg/dL   Very High    Physical Exam:    VS:  BP 102/70   Pulse 64   Ht 5' 7.5" (1.715 m)   Wt 161 lb 9.6 oz (73.3 kg)   SpO2 98%   BMI 24.94 kg/m     Wt Readings from Last 3 Encounters:  10/21/20 161 lb 9.6 oz (73.3 kg)  09/02/20 160 lb 9.6 oz (72.8 kg)  08/03/20 159 lb 9.6 oz (72.4 kg)    GEN: Well nourished, well developed in no acute distress HEENT: Normal, moist mucous membranes NECK: No JVD CARDIAC: regular rhythm, normal S1 and S2, no rubs or gallops. No murmur. VASCULAR: Radial and DP pulses 2+ bilaterally. No carotid bruits RESPIRATORY:  crackles at lung bases consistent with atelectasis, without wheezing or rhonchi  ABDOMEN: Soft, non-tender, non-distended MUSCULOSKELETAL:  Ambulates independently SKIN: Warm and dry, trivial bilateral LE edema NEUROLOGIC:  Alert and oriented x 3. No focal neuro deficits noted. PSYCHIATRIC:  Normal affect    ASSESSMENT:    1. Dyspnea on  exertion   2. Atelectasis, bilateral   3. Nonischemic cardiomyopathy ([place])   4. Biventricular cardiac pacemaker in situ   5. Left bundle branch block   6. Long term current use of anticoagulant therapy   7. Bilateral leg edema   8. AF (paroxysmal atrial fibrillation) (HCC)    PLAN:    Dyspnea on exertion "Whistling" when lying down Seasonal allergies Atelectasis -she appears euvolemic on exam. I cannot appreciate whistling/wheezing today, but there is some atelectasis that improves with deep breathing/cough -discussed she may have some reactive airway with her allergies. Recommended she follow up with her PCP if OTC treatments do not improve her symptoms. Could consider something like singulair  Nonischemic cardiomyopathy, LBBB:  -EF normalized s/p CRT,  follows with Dr. Lovena Le -updated echo 08/12/20 -continue spironolactone -on propranolol 60 mg daily. 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  -It was never fully determined what caused her splenic infarction, as she had a possible PFO on TEE but also had only very brief afib seen on her device. Dr. Wynonia Lawman previously started apixaban, and she is tolerating this without issue. She prefers to be on anticoagulation vs. Risking another embolic event given the uncertain cause.  Cardiac risk counseling and prevention recommendations: -recommend heart healthy/Mediterranean diet, with whole grains, fruits, vegetable, fish, lean meats, nuts, and olive oil. Limit salt. -recommend moderate walking, 3-5 times/week for 30-50 minutes each session. Aim for at least 150 minutes.week. Goal should be pace of 3 miles/hours, or walking 1.5 miles in 30 minutes -recommend avoidance of tobacco products. Avoid excess alcohol.  Plan for follow up: 6 mos or sooner as needed  Buford Dresser, MD, PhD [place]  [facility]   Medication Adjustments/Labs and Tests Ordered: Current medicines are reviewed at length with the patient today.  Concerns regarding medicines are outlined above.  No orders of the defined types were placed in this encounter.   No orders of the defined types were placed in this encounter.   Patient Instructions  Medication Instructions:    Your Physician recommend you continue on your current medication as directed.    *If  you need a refill on your cardiac medications before your next appointment, please call your pharmacy*   Lab Work: None ordered today   Testing/Procedures: None ordered today   Follow-Up: At [facility], you and your health needs are our priority.  As part of our continuing mission to provide you with exceptional heart care, we have created designated Provider Care Teams.  These Care Teams include your primary Cardiologist (physician) and Advanced Practice Providers (APPs -  Physician Assistants and Nurse Practitioners) who all work together to provide you with the care you need, when you need it.  We recommend signing up for the patient portal called "MyChart".  Sign up information is provided on this After Visit Summary.  MyChart is used to connect with patients for Virtual Visits (Telemedicine).  Patients are able to view lab/test results, encounter notes, upcoming appointments, etc.  Non-urgent messages can be sent to your provider as well.   To  learn more about what you can do with MyChart, go to [email or web address].    Your next appointment:   6 month(s)  The format for your next appointment:   In Person  Provider:   Buford Dresser, MD   Other Instructions I will send my note to your primary care--I think lung function tests would be helpful.    I,Mathew Stumpf,acting as a Education administrator for [employer], MD.,have documented all relevant documentation on the behalf of Buford Dresser, MD,as directed by  Buford Dresser, MD while in the presence of Buford Dresser, MD.  I, Buford Dresser, MD, have reviewed all documentation for this visit. The documentation on 11/09/20 for the exam, diagnosis, procedures, and orders are all accurate and complete.   Signed, Buford Dresser, MD PhD 11/09/2020  [facility]

## 2020-11-05 ENCOUNTER — Telehealth: Payer: Self-pay

## 2020-11-05 ENCOUNTER — Ambulatory Visit (HOSPITAL_COMMUNITY): Admission: RE | Admit: 2020-11-05 | Payer: Medicare PPO | Source: Ambulatory Visit

## 2020-11-05 NOTE — Telephone Encounter (Signed)
Incoming call from patient who is extremely upset secondary to her being informed she could not have an MRI for possible left rotator cuff. Patient states she was told she has an "exposed wire". After extensive EMR review, unable to locate any documentation to confirm. Patient now concerned the pain she is having in her left shoulder/breast area is related to "exposed wire". Attempted to provide an abundance or reassurance. Manual transmission received with stable P/R waves, RV threshold chronically high with stable impedances. Informed patient that based on [employer] MRI compatibility, the 6935 lead is not compatible. Patient states she had an MRI in 2021 at [facility] which was confirmed via EMR. There is no documentation that patient was cleared through this office. Call was discontinued by patient. Consulted with Bethann Humble, RN who suggested patient appointment with EP APP to discuss.  Patient called back and offered appointment. States she would like her device tested and discuss MRI with App. She states she also called [employer] after first discussion with this RN and was also told device was not MRI compatible secondary to 6935 lead. Will route to scheduling.

## 2020-11-05 NOTE — Telephone Encounter (Signed)
The patient states she was supposed to have a MRI but it got cancelled because she has a lead that is out of place. She would like to know is that the reason she been having chest pains? She said it comes and goes. She is not currently having chest pains. She would like to know if the lead will have to be fixed? I had the patient send a transmission for the nurse to review. Transmission received.

## 2020-11-06 DIAGNOSIS — R0602 Shortness of breath: Secondary | ICD-10-CM | POA: Diagnosis not present

## 2020-11-06 DIAGNOSIS — Z23 Encounter for immunization: Secondary | ICD-10-CM | POA: Diagnosis not present

## 2020-11-06 DIAGNOSIS — D0512 Intraductal carcinoma in situ of left breast: Secondary | ICD-10-CM | POA: Diagnosis not present

## 2020-11-06 DIAGNOSIS — E538 Deficiency of other specified B group vitamins: Secondary | ICD-10-CM | POA: Diagnosis not present

## 2020-11-07 NOTE — Progress Notes (Signed)
Cardiology Office Note Date:  11/07/2020  Patient ID:  Dana, Schmidt 1955-03-03, MRN [id number] PCP:  [name], NP  Cardiologist:  Dr. Wynonia Lawman >> Dr. Harrell Gave Electrophysiologist: Dr. Lovena Le    Chief Complaint: concerns of her device, cancelled MRI, shoulder pain  History of Present Illness: [name] is a 65 y.o. female with history of NICM w/CRT-D >> CRT-P with normalization of EF, chronic CHF (systolic), LBBB, GERD, HTN, thyroid disease, essential tremor, Reynaud's disease, scoliosis, AFib, PE  She comes in today to be seen for Dr. Lovena Le, last seen by him 05/01/20, she was undergoing XRT and chemo for b breast cancer, less energy with her treatments though otherwise doing well. Felt to be well compensated, device function stable, maintaining SR, no changes were made.  She more recently saw Dr. Harrell Gave July 2022, pt reported generally doing OK though discussed some DOE, flutters now and then, a noisy cough, planned to update her echo.  No medicine changes were .  Saw oncology 11/06/20 completed XRT, maintained on anastrozole, with plans for an annual visit  She was scheduled for a shoulder MRI though cancelled with non-MRI compatible system 2/2 capped HV lead.  TODAY She is generally tired, not much energy She has CP that is ONLY when she coughs. Has had some degree of SOB/DOE for quite some time, and is pending a pulmonary consult, her PMD did a CXR recently has not gotten report of the result NO near syncope or syncope No palpitations Bruises easily on her arms, no bleeding or signs of bleeding Her PMD does her labs   Device information: MDT downgraded to CRT-P, 11/24/16 (placed sub-pec) (original device (leads)) was a CRT-D implanted 2013 Known high RV threshold Programmed LV pacing, adaptive BiVe   Past Medical History:  Diagnosis Date   AF (paroxysmal atrial fibrillation) ([place])    Arthritis    BCC (basal cell carcinoma) 03/11/2003    left distal lower leg ankle-tx dr Tonia Brooms   Biventricular cardiac pacemaker in situ    a. prior CRT-D in 2013 with downgrade to CRT-Pacemaker only in 11/2016.   Breast cancer ([place]) 86/57/8469   Chronic systolic CHF (congestive heart failure) (H[facility])    Complication of anesthesia    aspiration   Essential tremor    GERD (gastroesophageal reflux disease)    otc   Hypertension    Hypothyroidism    LBBB (left bundle branch block)    conduction disorder   Migraines    Mild aortic insufficiency    NICM (nonischemic cardiomyopathy) ([place])    a. 2012: normal coronaries, EF 25-30%. b. improved to 55% 11/2016.   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   right upper lip-mohs Dr.mitkov   Scoliosis     Past Surgical History:  Procedure Laterality Date   ARTERY BIOPSY  12/29/2011   Procedure: MINOR BIOPSY TEMPORAL ARTERY;  Surgeon: Edward Jolly, MD;  Location: [facility];  Service: General;  Laterality: Right;  Right temporal artery biopsy   BACK SURGERY  12/31/2009   "for flat back syndrome; fused bottom of my back"   BI-VENTRICULAR IMPLANTABLE CARDIOVERTER DEFIBRILLATOR Left 01/20/2011   Procedure: BI-VENTRICULAR IMPLANTABLE CARDIOVERTER DEFIBRILLATOR  (CRT-D);  Surgeon: Evans Lance, MD;  Location[facility] C[facility] LAB;  Service: Cardiovascular;  Laterality: Left;   BIV ICD GENERATOR CHANGEOUT N/A  11/24/2016   Procedure: BIV ICD GENERATOR CHANGEOUT;  Surgeon: Evans Lance, MD;  Location: [place] CV LAB;  Service: Cardiovascular;  Laterality: N/A;   BREAST LUMPECTOMY WITH RADIOACTIVE SEED LOCALIZATION Left 01/22/2020   Procedure: LEFT BREAST LUMPECTOMY WITH RADIOACTIVE SEED LOCALIZATION;  Surgeon: Donnie Mesa, MD;  Location: [place];  Service: General;  Laterality: Left;  LMA   CHOLECYSTECTOMY  ~1996   FOOT NEUROMA  SURGERY  ~ 2003   right   RE-EXCISION OF BREAST LUMPECTOMY Left 03/03/2020   Procedure: RE-EXCISION MARGINS OF LEFT BREAST LUMPECTOMY;  Surgeon: Donnie Mesa, MD;  Location: [place];  Service: General;  Laterality: Left;   ROTATOR CUFF REPAIR  ~ 2009   left   SHOULDER ARTHROSCOPY W/ ROTATOR CUFF REPAIR Right 09/21/2011   SKIN CANCER EXCISION     nose, forehead, left leg   TEE WITHOUT CARDIOVERSION N/A 03/01/2017   Procedure: TRANSESOPHAGEAL ECHOCARDIOGRAM (TEE);  Surgeon: Skeet Latch, MD;  Location: [place];  Service: Cardiovascular;  Laterality: N/A;   THYROID LOBECTOMY  ~ 2006   right   TUBAL LIGATION  ~ [place]  ~ 2009   VESICOVAGINAL FISTULA CLOSURE W/ TAH      Current Outpatient Medications  Medication Sig Dispense Refill   acetaminophen (TYLENOL) 325 MG tablet Take 650 mg by mouth as needed.     amitriptyline (ELAVIL) 25 MG tablet Take 39m by mouth every night  5   anastrozole (ARIMIDEX) 1 MG tablet Take 1 tablet (1 mg total) by mouth daily. 90 tablet 3   CALCIUM PO Take 2 tablets by mouth daily.     cetirizine (ZYRTEC) 10 MG tablet Take 10 mg by mouth daily.     Cyanocobalamin 1000 MCG/ML KIT Inject 1,000 mcg every 30 (thirty) days as directed.     cyclobenzaprine (FLEXERIL) 10 MG tablet 1 tablet     eletriptan (RELPAX) 40 MG tablet Take 40 mg by mouth daily as needed for migraine. May repeat in 2 hours if necessary     ELIQUIS 5 MG TABS tablet TAKE 1 TABLET BY MOUTH TWICE A DAY 180 tablet 1   gabapentin (NEURONTIN) 600 MG tablet Take 600 mg by mouth 2 (two) times daily.     ketoconazole (NIZORAL) 2 % cream Apply to corner of mouth nightly 15 g 0   levothyroxine (SYNTHROID, LEVOTHROID) 100 MCG tablet Take 100 mcg daily before breakfast by mouth.     Melatonin 5 MG CAPS Take 5 mg by mouth at bedtime.     Menthol (BIOFREEZE) 10 % AERO      mupirocin ointment (BACTROBAN) 2 % Apply to leg once to twice daily 22 g 0    nitrofurantoin (MACRODANTIN) 100 MG capsule Take 1 capsule (100 mg total) by mouth as needed (After Sexual Intercourse). 90 capsule 1   Omega-3 Fatty Acids (FISH OIL) 1000 MG CAPS 1 capsule     polyethylene glycol (MIRALAX / GLYCOLAX) packet Take 17 g every evening by mouth.     propranolol (INDERAL) 60 MG tablet Take 60 mg by mouth daily.     pseudoephedrine (SUDAFED) 30 MG tablet Take 30 mg by mouth as needed for congestion.     spironolactone (ALDACTONE) 25 MG tablet Take 0.5 tablets (12.5 mg total) by mouth daily. 45 tablet 3   Varenicline Tartrate (TYRVAYA) 0.03 MG/ACT SOLN Place into the nose.     VITAMIN D PO Take 2 capsules by mouth daily.  No current facility-administered medications for this visit.    Allergies:   Other, Tape, Codeine, Fentanyl, and Morphine and related   Social History:  The patient  reports that she has never smoked. She has never used smokeless tobacco. She reports that she does not drink alcohol and does not use drugs.   Family History:  The patient's family history includes Atrial fibrillation in her sister; Heart disease in her mother, sister, and sister; Heart disease (age of onset: 57) in her father; Heart failure (age of onset: 70) in her mother; Hypertension in her mother; Macular degeneration in her father; Stroke in her mother.  ROS:  Please see the history of present illness.  All other systems are reviewed and otherwise negative.   PHYSICAL EXAM:  VS:  There were no vitals taken for this visit. BMI: There is no height or weight on file to calculate BMI. Well nourished, well developed, in no acute distress  HEENT: normocephalic, atraumatic  Neck: no JVD, carotid bruits or masses Cardiac:  RRR; no significant murmurs, no rubs, or gallops Lungs:  CTA b/l, no wheezing, rhonchi or rales  Abd: soft, nontender MS: no deformity or atrophy Ext: no edema  Skin: warm and dry, no rash Neuro:  No gross deficits appreciated Psych: euthymic mood, full  affect   PPM site is stable, no tethering or discomfort   EKG: not done today  Device interrogation done today and reviewed by myself:  Battery and lead measurements are c/w prior Hr RV lead threshold is 3.0/1.0, programmed 4.0/1.0 Programmed LV pacing/adaptive BV She has 97.8 effective pacing, 93% is LV alone, 6.8% BiVe Optivol looks good  08/12/2020: TTE IMPRESSIONS   1. Abnormal septal motion EF and GLS stable since 08/14/19 . Left  ventricular ejection fraction, by estimation, is 50 to 55%. The left  ventricle has low normal function. The left ventricle has no regional wall  motion abnormalities. Left ventricular  diastolic parameters were normal. The average left ventricular global  longitudinal strain is -22.9 %. The global longitudinal strain is normal.   2. Right ventricular systolic function is normal. The right ventricular  size is normal. There is normal pulmonary artery systolic pressure.   3. The pericardial effusion is posterior to the left ventricle.   4. The mitral valve is normal in structure. No evidence of mitral valve  regurgitation. No evidence of mitral stenosis.   5. The aortic valve is tricuspid. Aortic valve regurgitation is mild. No  aortic stenosis is present.   6. Aortic dilatation noted. There is mild dilatation of the ascending  aorta, measuring 39 mm.   7. The inferior vena cava is normal in size with greater than 50%  respiratory variability, suggesting right atrial pressure of 3 mmHg.   Comparison(s): 08/14/19 EF 50-55%.    Echo 08/14/19 1. Left ventricular ejection fraction, by estimation, is 50 to 55%. The  left ventricle has low normal function. The left ventricle has no regional  wall motion abnormalities. Left ventricular diastolic parameters are  consistent with Grade I diastolic  dysfunction (impaired relaxation). The average left ventricular global  longitudinal strain is -25.1 %.   2. Right ventricular systolic function is normal. The  right ventricular  size is normal. There is normal pulmonary artery systolic pressure.   3. The mitral valve is normal in structure. No evidence of mitral valve  regurgitation. No evidence of mitral stenosis.   4. The aortic valve is normal in structure. Aortic valve regurgitation is  mild.  No aortic stenosis is present.   5. Aortic root/ascending aorta has been repaired/replaced. 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   Comparison(s): 02/26/17 EF 60-65%. PA pressure 13mHg.      03/01/17: TEE Study Conclusions - Left ventricle: Systolic function was normal. Wall motion was   normal; there were no regional wall motion abnormalities. - Aortic valve: There was mild regurgitation. - Mitral valve: There was trivial regurgitation. - Left atrium: No evidence of thrombus in the atrial cavity or   appendage. No evidence of thrombus in the atrial cavity or   appendage. - Right ventricle: The cavity size was normal. Wall thickness was   normal. Systolic function was normal. - Right atrium: No evidence of thrombus in the atrial cavity or   appendage. - Atrial septum: A patent foramen ovale is likely. There was no   right to left flow at rest but saline microcavitation study was   positive with abdominal pressure. The test was repeated to ensure   that there was no extracardiac shunt and was negative. - Tricuspid valve: There was trivial regurgitation.  Recent Labs: 02/28/2020: BUN 18; Creatinine, Ser 0.93; Potassium 4.4; Sodium 138  No results found for requested labs within last 8760 hours.   CrCl cannot be calculated (Patient's most recent lab result is older than the maximum 21 days allowed.).   Wt Readings from Last 3 Encounters:  10/21/20 161 lb 9.6 oz (73.3 kg)  09/02/20 160 lb 9.6 oz (72.8 kg)  08/03/20 159 lb 9.6 oz (72.4 kg)     Other studies reviewed: Additional studies/records reviewed today include: summarized above  ASSESSMENT AND  PLAN:  1. CRT-P     Known elevated RV thresholds, stable findings     No programming changes made  Discussed that her capped HV pin makes her system not MRI compatible, she understands  2. HTN     Relative hypotension,      looks OK today  3. Splenic infarct, 02/2017 4. PAFib CHA2DS2Vasc is 6, on Eliquis, appropriately dosed     0 % burden  4. NICM w/improved LVEF     OptiVol looks good     No exam findings of volume OL     C/w Dr. CHarrell Gave    Disposition: remotes as usual and in clinic with EP in a year, sooner if nneeded     Current medicines are reviewed at length with the patient today.  The patient did not have any concerns regarding medicines.  SVenetia Night PA-C 11/07/2020 5:44 PM     [facility] [phone])  [phone](fax)

## 2020-11-09 ENCOUNTER — Other Ambulatory Visit: Payer: Self-pay

## 2020-11-09 ENCOUNTER — Ambulatory Visit: Payer: Medicare PPO | Admitting: Orthopedic Surgery

## 2020-11-09 ENCOUNTER — Encounter (HOSPITAL_BASED_OUTPATIENT_CLINIC_OR_DEPARTMENT_OTHER): Payer: Self-pay | Admitting: Cardiology

## 2020-11-09 DIAGNOSIS — Z9889 Other specified postprocedural states: Secondary | ICD-10-CM

## 2020-11-09 DIAGNOSIS — M25512 Pain in left shoulder: Secondary | ICD-10-CM

## 2020-11-09 NOTE — Progress Notes (Signed)
Patient was to see Dr Marlou Sa today for MRI review. Patient has not had scan because she had a pacemaker and defib. Unable to have MRI despite the fact had scan 2 years ago. Dr Marlou Sa wanted patient to have CT arthrogram. Will you please see about getting her scheduled for scan ASAP?

## 2020-11-10 ENCOUNTER — Ambulatory Visit: Payer: Medicare PPO | Admitting: Physician Assistant

## 2020-11-10 ENCOUNTER — Encounter: Payer: Self-pay | Admitting: Physician Assistant

## 2020-11-10 VITALS — BP 100/60 | HR 61 | Ht 67.5 in | Wt 163.2 lb

## 2020-11-10 DIAGNOSIS — I480 Paroxysmal atrial fibrillation: Secondary | ICD-10-CM | POA: Diagnosis not present

## 2020-11-10 DIAGNOSIS — Z950 Presence of cardiac pacemaker: Secondary | ICD-10-CM | POA: Diagnosis not present

## 2020-11-10 DIAGNOSIS — I10 Essential (primary) hypertension: Secondary | ICD-10-CM | POA: Diagnosis not present

## 2020-11-10 DIAGNOSIS — I428 Other cardiomyopathies: Secondary | ICD-10-CM

## 2020-11-10 LAB — CUP PACEART INCLINIC DEVICE CHECK
Battery Remaining Longevity: 79 mo
Battery Voltage: 2.97 V
Brady Statistic AP VP Percent: 0.53 %
Brady Statistic AP VS Percent: 0.03 %
Brady Statistic AS VP Percent: 97.66 %
Brady Statistic AS VS Percent: 1.78 %
Brady Statistic RA Percent Paced: 0.56 %
Brady Statistic RV Percent Paced: 6.74 %
Date Time Interrogation Session: 20221101124302
Implantable Lead Implant Date: 20130110
Implantable Lead Implant Date: 20130110
Implantable Lead Implant Date: 20130110
Implantable Lead Location: 753858
Implantable Lead Location: 753859
Implantable Lead Location: 753860
Implantable Lead Model: 4196
Implantable Lead Model: 5076
Implantable Lead Model: 6935
Implantable Pulse Generator Implant Date: 20181115
Lead Channel Impedance Value: 1026 Ohm
Lead Channel Impedance Value: 1444 Ohm
Lead Channel Impedance Value: 342 Ohm
Lead Channel Impedance Value: 418 Ohm
Lead Channel Impedance Value: 665 Ohm
Lead Channel Impedance Value: 779 Ohm
Lead Channel Impedance Value: 874 Ohm
Lead Channel Impedance Value: 893 Ohm
Lead Channel Impedance Value: 931 Ohm
Lead Channel Pacing Threshold Amplitude: 0.625 V
Lead Channel Pacing Threshold Amplitude: 1.75 V
Lead Channel Pacing Threshold Amplitude: 2.375 V
Lead Channel Pacing Threshold Pulse Width: 0.4 ms
Lead Channel Pacing Threshold Pulse Width: 0.4 ms
Lead Channel Pacing Threshold Pulse Width: 0.8 ms
Lead Channel Sensing Intrinsic Amplitude: 0.75 mV
Lead Channel Sensing Intrinsic Amplitude: 17 mV
Lead Channel Sensing Intrinsic Amplitude: 18.375 mV
Lead Channel Sensing Intrinsic Amplitude: 2 mV
Lead Channel Setting Pacing Amplitude: 2 V
Lead Channel Setting Pacing Amplitude: 2.25 V
Lead Channel Setting Pacing Amplitude: 4 V
Lead Channel Setting Pacing Pulse Width: 0.8 ms
Lead Channel Setting Pacing Pulse Width: 1 ms
Lead Channel Setting Sensing Sensitivity: 0.9 mV

## 2020-11-10 MED ORDER — PROPRANOLOL HCL 60 MG PO TABS: 60.0000 mg | ORAL_TABLET | Freq: Every day | ORAL | 3 refills | Status: AC

## 2020-11-10 MED ORDER — ELIQUIS 5 MG PO TABS
5.0000 mg | ORAL_TABLET | Freq: Two times a day (BID) | ORAL | 3 refills | Status: DC
Start: 2020-11-10 — End: 2021-10-04

## 2020-11-10 NOTE — Progress Notes (Signed)
Sent message to [facility] with [place] [facility] to see if can get someone to get pt scheduled

## 2020-11-10 NOTE — Patient Instructions (Signed)

## 2020-11-18 ENCOUNTER — Ambulatory Visit (HOSPITAL_BASED_OUTPATIENT_CLINIC_OR_DEPARTMENT_OTHER): Payer: Medicare PPO | Admitting: Obstetrics & Gynecology

## 2020-11-23 ENCOUNTER — Ambulatory Visit
Admission: RE | Admit: 2020-11-23 | Discharge: 2020-11-23 | Disposition: A | Discharge status: Home / Self Care | Payer: Medicare PPO | Source: Ambulatory Visit | Attending: Orthopedic Surgery | Admitting: Orthopedic Surgery

## 2020-11-23 ENCOUNTER — Other Ambulatory Visit: Payer: Self-pay

## 2020-11-23 DIAGNOSIS — Z9889 Other specified postprocedural states: Secondary | ICD-10-CM

## 2020-11-23 DIAGNOSIS — M25512 Pain in left shoulder: Secondary | ICD-10-CM

## 2020-11-23 DIAGNOSIS — M25612 Stiffness of left shoulder, not elsewhere classified: Secondary | ICD-10-CM | POA: Diagnosis not present

## 2020-11-23 DIAGNOSIS — S46012A Strain of muscle(s) and tendon(s) of the rotator cuff of left shoulder, initial encounter: Secondary | ICD-10-CM | POA: Diagnosis not present

## 2020-11-23 MED ORDER — IOPAMIDOL (ISOVUE-M 200) INJECTION 41%
20.0000 mL | Freq: Once | INTRAMUSCULAR | Status: AC
  Administered 2020-11-23: 20 mL via INTRA_ARTICULAR

## 2020-11-24 ENCOUNTER — Ambulatory Visit (INDEPENDENT_AMBULATORY_CARE_PROVIDER_SITE_OTHER): Payer: Medicare PPO

## 2020-11-24 DIAGNOSIS — I428 Other cardiomyopathies: Secondary | ICD-10-CM | POA: Diagnosis not present

## 2020-11-24 LAB — CUP PACEART REMOTE DEVICE CHECK
Battery Remaining Longevity: 77 mo
Battery Voltage: 2.97 V
Brady Statistic AP VP Percent: 0.18 %
Brady Statistic AP VS Percent: 0.02 %
Brady Statistic AS VP Percent: 98.09 %
Brady Statistic AS VS Percent: 1.72 %
Brady Statistic RA Percent Paced: 0.21 %
Brady Statistic RV Percent Paced: 6.66 %
Date Time Interrogation Session: 20221114192200
Implantable Lead Implant Date: 20130110
Implantable Lead Implant Date: 20130110
Implantable Lead Implant Date: 20130110
Implantable Lead Location: 753858
Implantable Lead Location: 753859
Implantable Lead Location: 753860
Implantable Lead Model: 4196
Implantable Lead Model: 5076
Implantable Lead Model: 6935
Implantable Pulse Generator Implant Date: 20181115
Lead Channel Impedance Value: 1368 Ohm
Lead Channel Impedance Value: 304 Ohm
Lead Channel Impedance Value: 380 Ohm
Lead Channel Impedance Value: 589 Ohm
Lead Channel Impedance Value: 741 Ohm
Lead Channel Impedance Value: 798 Ohm
Lead Channel Impedance Value: 836 Ohm
Lead Channel Impedance Value: 855 Ohm
Lead Channel Impedance Value: 969 Ohm
Lead Channel Pacing Threshold Amplitude: 0.625 V
Lead Channel Pacing Threshold Amplitude: 1.5 V
Lead Channel Pacing Threshold Amplitude: 2.375 V
Lead Channel Pacing Threshold Pulse Width: 0.4 ms
Lead Channel Pacing Threshold Pulse Width: 0.4 ms
Lead Channel Pacing Threshold Pulse Width: 0.8 ms
Lead Channel Sensing Intrinsic Amplitude: 0.5 mV
Lead Channel Sensing Intrinsic Amplitude: 0.5 mV
Lead Channel Sensing Intrinsic Amplitude: 18.375 mV
Lead Channel Sensing Intrinsic Amplitude: 18.375 mV
Lead Channel Setting Pacing Amplitude: 2 V
Lead Channel Setting Pacing Amplitude: 2.25 V
Lead Channel Setting Pacing Amplitude: 4 V
Lead Channel Setting Pacing Pulse Width: 0.8 ms
Lead Channel Setting Pacing Pulse Width: 1 ms
Lead Channel Setting Sensing Sensitivity: 0.9 mV

## 2020-11-26 ENCOUNTER — Encounter (HOSPITAL_BASED_OUTPATIENT_CLINIC_OR_DEPARTMENT_OTHER): Payer: Self-pay | Admitting: Obstetrics & Gynecology

## 2020-11-26 ENCOUNTER — Other Ambulatory Visit: Payer: Self-pay

## 2020-11-26 ENCOUNTER — Ambulatory Visit (HOSPITAL_BASED_OUTPATIENT_CLINIC_OR_DEPARTMENT_OTHER): Payer: Medicare PPO | Admitting: Obstetrics & Gynecology

## 2020-11-26 VITALS — BP 117/47 | HR 63 | Ht 67.5 in | Wt 161.6 lb

## 2020-11-26 DIAGNOSIS — N390 Urinary tract infection, site not specified: Secondary | ICD-10-CM

## 2020-11-26 LAB — POCT URINALYSIS DIPSTICK
Bilirubin, UA: NEGATIVE
Glucose, UA: NEGATIVE
Ketones, UA: NEGATIVE
Leukocytes, UA: NEGATIVE
Nitrite, UA: NEGATIVE
Protein, UA: NEGATIVE
Spec Grav, UA: 1.015 (ref 1.010–1.025)
Urobilinogen, UA: 0.2 E.U./dL
pH, UA: 6 (ref 5.0–8.0)

## 2020-11-26 NOTE — Progress Notes (Signed)
GYNECOLOGY  VISIT  CC:   recurrent UTI  HPI: 65 y.o. G0P0000 Married White or Caucasian female here for follow up after being started on macrobid for recurrent UTIs.  Pt has been taking this every day.  She's not had any symptoms of infection or infections since her prior visit.  Is so grateful she's had three months without issues.  She cannot use estrogen products due to breast cancer hx.  We discussed vit E vaginal suppositories and Revaree vaginal suppositories as well.  For now, do not think we should make any changes.  Possibly in the future, we can decrease to every other day or every third day.  Pt aware if has any new symptoms, should increase antibiotic to BID and call office for urine culture.  Requested dip urine today which was negative.  No urine culture will be sent today.  GYNECOLOGIC HISTORY: No LMP recorded. Patient has had a hysterectomy.  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   Trigeminal neuralgia 12/14/2012   Insomnia, persistent 11/03/2011   AICD (automatic cardioverter/defibrillator) present    Aortic regurgitation    Nonischemic cardiomyopathy ([place]) 01/21/2011   Esophagitis, reflux 08/24/2010   Idiopathic peripheral neuropathy 08/24/2010   Paroxysmal atrial fibrillation ([place]) 02/23/2010   Hypothyroidism    History of migraine headaches    Left bundle branch block    Scoliosis     Class: Chronic   Lumbar canal stenosis 12/25/2009   Anxiety state 07/24/2008   Avitaminosis D 03/21/2008   Atypical migraine 01/30/2008   Benign essential HTN 01/30/2008   Paroxysmal digital cyanosis 02/15/2007    Past Medical History:  Diagnosis Date   AF (paroxysmal atrial  fibrillation) ([place])    Arthritis    BCC (basal cell carcinoma) 03/11/2003   left distal lower leg ankle-tx dr Tonia Brooms   Biventricular cardiac pacemaker in situ    a. prior CRT-D in 2013 with downgrade to CRT-Pacemaker only in 11/2016.   Breast cancer ([place]) 90/24/0973   Chronic systolic CHF (congestive heart failure) (H[facility])    Complication of anesthesia    aspiration   Essential tremor    GERD (gastroesophageal reflux disease)    otc   Hypertension    Hypothyroidism    LBBB (left bundle branch block)    conduction disorder   Migraines    Mild aortic insufficiency    NICM (nonischemic cardiomyopathy) ([place])    a. 2012: normal coronaries, EF 25-30%. b. improved to 55% 11/2016.   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   right upper lip-mohs Dr.mitkov   Scoliosis     Past Surgical History:  Procedure Laterality Date   ARTERY BIOPSY  12/29/2011   Procedure: MINOR BIOPSY TEMPORAL ARTERY;  Surgeon: Edward Jolly, MD;  Location: [place];  Service: General;  Laterality: Right;  Right temporal artery biopsy   BACK SURGERY  12/31/2009   "for flat back syndrome; fused bottom of my back"   BI-VENTRICULAR IMPLANTABLE CARDIOVERTER DEFIBRILLATOR Left 01/20/2011   Procedure: BI-VENTRICULAR IMPLANTABLE CARDIOVERTER DEFIBRILLATOR  (CRT-D);  Surgeon: Evans Lance, MD;  Location: [facility] CATH LAB;  Service: Cardiovascular;  Laterality: Left;   BIV ICD GENERATOR CHANGEOUT N/A 11/24/2016   Procedure: BIV ICD GENERATOR CHANGEOUT;  Surgeon: Evans Lance, MD;  Location: [place] CV LAB;  Service: Cardiovascular;  Laterality: N/A;   BREAST LUMPECTOMY WITH RADIOACTIVE SEED LOCALIZATION Left 01/22/2020   Procedure: LEFT BREAST LUMPECTOMY WITH RADIOACTIVE SEED LOCALIZATION;  Surgeon: Donnie Mesa, MD;  Location: [place];   Service: General;  Laterality: Left;  LMA   CHOLECYSTECTOMY  ~1996   FOOT NEUROMA SURGERY  ~ 2003   right   RE-EXCISION OF BREAST LUMPECTOMY Left 03/03/2020   Procedure: RE-EXCISION MARGINS OF LEFT BREAST LUMPECTOMY;  Surgeon: Donnie Mesa, MD;  Location: [place];  Service: General;  Laterality: Left;   ROTATOR CUFF REPAIR  ~ 2009   left   SHOULDER ARTHROSCOPY W/ ROTATOR CUFF REPAIR Right 09/21/2011   SKIN CANCER EXCISION     nose, forehead, left leg   TEE WITHOUT CARDIOVERSION N/A 03/01/2017   Procedure: TRANSESOPHAGEAL ECHOCARDIOGRAM (TEE);  Surgeon: Skeet Latch, MD;  Location: [place];  Service: Cardiovascular;  Laterality: N/A;   THYROID LOBECTOMY  ~ 2006   right   TUBAL LIGATION  ~ [place]  ~ 2009   VESICOVAGINAL FISTULA CLOSURE W/ TAH      MEDS:   Current Outpatient Medications on File Prior to Visit  Medication Sig Dispense Refill   acetaminophen (TYLENOL) 325 MG tablet Take 650 mg by mouth as needed.     amitriptyline (ELAVIL) 25 MG tablet Take 70m by mouth every night  5   anastrozole (ARIMIDEX) 1 MG tablet Take 1 tablet (1 mg total) by mouth daily. 90 tablet 3   CALCIUM PO Take 2 tablets by mouth daily.     cetirizine (ZYRTEC) 10 MG tablet Take 10 mg by mouth daily.     Cyanocobalamin 1000 MCG/ML KIT Inject 1,000 mcg every 30 (thirty) days as directed.     cyclobenzaprine (FLEXERIL) 10 MG tablet 1 tablet     eletriptan (RELPAX) 40 MG tablet Take 40 mg by mouth daily as needed for migraine. May repeat in 2 hours if necessary     ELIQUIS 5 MG TABS tablet Take 1 tablet (5 mg total) by mouth 2 (two) times daily. 180 tablet 3   gabapentin (NEURONTIN) 600 MG tablet Take 600 mg by mouth 2 (two) times daily.     ketoconazole (NIZORAL) 2 % cream Apply to corner of mouth nightly 15 g 0   levothyroxine (SYNTHROID, LEVOTHROID) 100 MCG tablet Take 100 mcg daily before breakfast by mouth.     Melatonin 5 MG CAPS Take 5 mg by mouth at  bedtime.     Menthol (BIOFREEZE) 10 % AERO      mupirocin ointment (BACTROBAN) 2 % Apply to leg once to twice daily 22 g 0   nitrofurantoin (MACRODANTIN) 100 MG capsule Take 1 capsule (100 mg total) by mouth as needed (After Sexual Intercourse). (Patient not taking: Reported on 11/10/2020) 90 capsule 1   nitrofurantoin (MACRODANTIN) 100 MG capsule Take 100 mg by mouth as needed. Per patient taking for UTI     Omega-3 Fatty Acids (FISH OIL) 1000 MG CAPS 1 capsule     polyethylene glycol (MIRALAX / GLYCOLAX) packet Take 17 g every evening by mouth.     propranolol (INDERAL) 60 MG tablet Take 1 tablet (60 mg total) by mouth daily. 90 tablet  3   pseudoephedrine (SUDAFED) 30 MG tablet Take 30 mg by mouth as needed for congestion.     spironolactone (ALDACTONE) 25 MG tablet Take 0.5 tablets (12.5 mg total) by mouth daily. 45 tablet 3   Varenicline Tartrate (TYRVAYA) 0.03 MG/ACT SOLN Place into the nose.     VITAMIN D PO Take 2 capsules by mouth daily.     No current facility-administered medications on file prior to visit.    ALLERGIES: Other, Tape, Codeine, Fentanyl, and Morphine and related  Family History  Problem Relation Age of Onset   Heart disease Mother    Heart failure Mother 20       CHF   Stroke Mother    Hypertension Mother    Heart disease Father 73   Macular degeneration Father    Heart disease Sister    Atrial fibrillation Sister    Heart disease Sister     SH:  married, non smoker  Review of Systems  Constitutional: Negative.   Genitourinary: Negative.    PHYSICAL EXAMINATION:    BP (!) 117/47 (BP Location: Right Arm, Patient Position: Sitting, Cuff Size: Normal)   Pulse 63   Ht 5' 7.5" (1.715 m)   Wt 161 lb 9.6 oz (73.3 kg)   BMI 24.94 kg/m     General appearance: alert, cooperative and appears stated age No other exam performed today  Assessment/Plan: 1. Recurrent UTI - POCT Urinalysis Dipstick - nitrofurantoin (MACRODANTIN) 100 MG capsule; Take 1  capsule (100 mg total) by mouth daily. Take for suppressive therapy to prevent UTI.  Dispense: 90 capsule; Refill: 1  - Recheck 6 months.

## 2020-11-26 NOTE — Patient Instructions (Signed)
Vit E vaginal suppositories ([place])  Revaree, Hyaluronic acid  You can purchase these on [employer]

## 2020-11-29 DIAGNOSIS — F330 Major depressive disorder, recurrent, mild: Secondary | ICD-10-CM | POA: Insufficient documentation

## 2020-11-29 DIAGNOSIS — N952 Postmenopausal atrophic vaginitis: Secondary | ICD-10-CM | POA: Insufficient documentation

## 2020-11-29 DIAGNOSIS — M810 Age-related osteoporosis without current pathological fracture: Secondary | ICD-10-CM | POA: Insufficient documentation

## 2020-11-29 MED ORDER — NITROFURANTOIN MACROCRYSTAL 100 MG PO CAPS
100.0000 mg | ORAL_CAPSULE | Freq: Every day | ORAL | 1 refills | Status: DC
Start: 2020-11-29 — End: 2021-10-05

## 2020-12-02 ENCOUNTER — Ambulatory Visit (INDEPENDENT_AMBULATORY_CARE_PROVIDER_SITE_OTHER): Payer: Medicare PPO | Admitting: Orthopedic Surgery

## 2020-12-02 ENCOUNTER — Other Ambulatory Visit: Payer: Self-pay

## 2020-12-02 ENCOUNTER — Encounter: Payer: Self-pay | Admitting: Orthopedic Surgery

## 2020-12-02 DIAGNOSIS — M75122 Complete rotator cuff tear or rupture of left shoulder, not specified as traumatic: Secondary | ICD-10-CM | POA: Diagnosis not present

## 2020-12-02 DIAGNOSIS — M19012 Primary osteoarthritis, left shoulder: Secondary | ICD-10-CM

## 2020-12-02 DIAGNOSIS — E538 Deficiency of other specified B group vitamins: Secondary | ICD-10-CM | POA: Diagnosis not present

## 2020-12-02 NOTE — Progress Notes (Signed)
Remote pacemaker transmission.   

## 2020-12-06 MED ORDER — LIDOCAINE HCL 1 % IJ SOLN
5.0000 mL | INTRAMUSCULAR | Status: AC | PRN
Start: 2020-12-02 — End: 2020-12-02
  Administered 2020-12-02: 18:00:00 5 mL

## 2020-12-06 MED ORDER — METHYLPREDNISOLONE ACETATE 40 MG/ML IJ SUSP
40.0000 mg | INTRAMUSCULAR | Status: AC | PRN
Start: 2020-12-02 — End: 2020-12-02
  Administered 2020-12-02: 18:00:00 40 mg via INTRA_ARTICULAR

## 2020-12-06 MED ORDER — BUPIVACAINE HCL 0.5 % IJ SOLN
9.0000 mL | INTRAMUSCULAR | Status: AC | PRN
  Administered 2020-12-02: 18:00:00 9 mL via INTRA_ARTICULAR

## 2020-12-06 NOTE — Progress Notes (Signed)
Office Visit Note   Patient: [name]           Date of Birth: 26-Mar-1955           MRN: [id number] Visit Date: 12/02/2020 Requested by: Dana Rota, NP [place],  [place] [address] PCP: Dana Rota, NP  Subjective: Chief Complaint  Patient presents with   Other     Scan review    HPI: [name] is a 65 year old patient with left shoulder pain for 2 years.  Here to review CT scan of the left shoulder which shows focal full-thickness tear of the supraspinatus tendon 1 cm proximal to its insertion with no significant retraction.  No rotator cuff muscle atrophy or fatty infiltration.  Patient states the pain will occasionally wake her from sleep when she turns over.  Pain is a bigger problem than weakness.  Hard for her to reach up above 90 degrees of forward flexion.  Hard for her to get dressed.  Uses Tylenol muscle relaxer and occasional Dilaudid.  She has had issues with her neck in the past as well.  Does have a history of left shoulder surgery in the remote past which required large open incision.  This was done elsewhere..  She is on Eliquis.  History of right shoulder rotator cuff repair done which is doing well.              ROS: All systems reviewed are negative as they relate to the chief complaint within the history of present illness.  Patient denies  fevers or chills.   Assessment & Plan: Visit Diagnoses:  1. Arthritis of left shoulder region     Plan: Impression is left shoulder pain 2 years duration with small rotator cuff tear which is nonretracted.  Going to try an injection in the shoulder to see if that helps.  Wants to avoid surgery for now.  Below shoulder level strengthening exercises encouraged.  Follow-up in 2 to 3 months if no improvement or symptoms worsen.  Follow-Up Instructions: No follow-ups on file.   Orders:  No orders of the defined types were placed in this encounter.  No orders of the defined types were placed in this  encounter.     Procedures: Large Joint Inj: L subacromial bursa on 12/02/2020 6:08 PM Indications: diagnostic evaluation and pain Details: 18 G 1.5 in needle, posterior approach  Arthrogram: No  Medications: 9 mL bupivacaine 0.5 %; 40 mg methylPREDNISolone acetate 40 MG/ML; 5 mL lidocaine 1 % Outcome: tolerated well, no immediate complications Procedure, treatment alternatives, risks and benefits explained, specific risks discussed. Consent was given by the patient. Immediately prior to procedure a time out was called to verify the correct patient, procedure, equipment, support staff and site/side marked as required. Patient was prepped and draped in the usual sterile fashion.      Clinical Data: No additional findings.  Objective: Vital Signs: There were no vitals taken for this visit.  Physical Exam:   Constitutional: Patient appears well-developed HEENT:  Head: Normocephalic Eyes:EOM are normal Neck: Normal range of motion Cardiovascular: Normal rate Pulmonary/chest: Effort normal Neurologic: Patient is alert Skin: Skin is warm Psychiatric: Patient has normal mood and affect   Ortho Exam: Ortho exam demonstrates full active and passive range of motion of the cervical spine.  Left shoulder has good rotator cuff strength demonstrated supraspinatus subscap muscle testing.  Not too much in way of coarse grinding or crepitus with internal and external rotation 9 degrees of abduction.  Passive range of motion is 60/95/160 on that left-hand side with no AC joint tenderness.  Specialty Comments:  No specialty comments available.  Imaging: No results found.   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   Trigeminal neuralgia 12/14/2012   Insomnia, persistent 11/03/2011   AICD (automatic cardioverter/defibrillator) present    Aortic regurgitation    Nonischemic cardiomyopathy ([place]) 01/21/2011   Esophagitis, reflux 08/24/2010   Idiopathic peripheral neuropathy 08/24/2010   Paroxysmal atrial fibrillation ([place]) 02/23/2010   Hypothyroidism    History of migraine headaches    Left bundle branch block    Scoliosis     Class: Chronic   Lumbar canal stenosis 12/25/2009   Anxiety state 07/24/2008   Avitaminosis D 03/21/2008   Atypical migraine 01/30/2008   Benign essential HTN 01/30/2008   Paroxysmal digital cyanosis 02/15/2007   Past Medical History:  Diagnosis Date   AF (paroxysmal atrial fibrillation) ([place])    Arthritis    BCC (basal cell carcinoma) 03/11/2003   left distal lower leg ankle-tx dr Tonia Brooms   Biventricular cardiac pacemaker in situ    a. prior CRT-D in 2013 with downgrade to CRT-Pacemaker only in 11/2016.   Breast cancer ([place]) 81/19/1478   Chronic systolic CHF (congestive heart failure) ([facility])    Complication of anesthesia    aspiration   Essential tremor    GERD (gastroesophageal reflux disease)    otc   Hypertension    Hypothyroidism    LBBB (left bundle branch block)    conduction disorder   Migraines    Mild aortic insufficiency    NICM (nonischemic cardiomyopathy) ([place])    a. 2012: normal coronaries, EF 25-30%. b. improved to 55% 11/2016.   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   right upper lip-mohs Dr.mitkov   Scoliosis     Family History  Problem Relation Age of Onset   Heart disease Mother     Heart failure Mother 72       CHF   Stroke Mother    Hypertension Mother    Heart disease Father 14   Macular degeneration Father    Heart disease Sister    Atrial fibrillation Sister    Heart disease Sister     Past Surgical History:  Procedure Laterality Date   ARTERY BIOPSY  12/29/2011   Procedure: MINOR BIOPSY TEMPORAL ARTERY;  Surgeon: Edward Jolly, MD;  Location: [facility];  Service: General;  Laterality: Right;  Right temporal artery biopsy   BACK SURGERY  12/31/2009   "for flat back syndrome; fused bottom of my back"   BI-VENTRICULAR IMPLANTABLE CARDIOVERTER DEFIBRILLATOR Left 01/20/2011   Procedure: BI-VENTRICULAR IMPLANTABLE CARDIOVERTER DEFIBRILLATOR  (CRT-D);  Surgeon: Evans Lance, MD;  Location: [facility] CATH LAB;  Service: Cardiovascular;  Laterality: Left;   BIV ICD GENERATOR CHANGEOUT N/A 11/24/2016   Procedure: BIV ICD GENERATOR  CHANGEOUT;  Surgeon: Evans Lance, MD;  Location: [facility] CV LAB;  Service: Cardiovascular;  Laterality: N/A;   BREAST LUMPECTOMY WITH RADIOACTIVE SEED LOCALIZATION Left 01/22/2020   Procedure: LEFT BREAST LUMPECTOMY WITH RADIOACTIVE SEED LOCALIZATION;  Surgeon: Donnie Mesa, MD;  Location: [facility];  Service: General;  Laterality: Left;  LMA   CHOLECYSTECTOMY  ~1996   FOOT NEUROMA SURGERY  ~ 2003   right   RE-EXCISION OF BREAST LUMPECTOMY Left 03/03/2020   Procedure: RE-EXCISION MARGINS OF LEFT BREAST LUMPECTOMY;  Surgeon: Donnie Mesa, MD;  Location: [facility];  Service: General;  Laterality: Left;   ROTATOR CUFF REPAIR  ~ 2009   left   SHOULDER ARTHROSCOPY W/ ROTATOR CUFF REPAIR Right 09/21/2011   SKIN CANCER EXCISION     nose, forehead, left leg   TEE WITHOUT CARDIOVERSION N/A 03/01/2017   Procedure: TRANSESOPHAGEAL ECHOCARDIOGRAM (TEE);  Surgeon: Skeet Latch, MD;  Location: [facility];  Service: Cardiovascular;  Laterality: N/A;   THYROID LOBECTOMY  ~ 2006    right   TUBAL LIGATION  ~ [place]  ~ 2009   VESICOVAGINAL FISTULA CLOSURE W/ TAH     Social History   Occupational History   Occupation: Pharmacist, hospital    Comment: n/a  Tobacco Use   Smoking status: Never   Smokeless tobacco: Never  Vaping Use   Vaping Use: Never used  Substance and Sexual Activity   Alcohol use: No   Drug use: No   Sexual activity: Yes

## 2020-12-18 DIAGNOSIS — H04123 Dry eye syndrome of bilateral lacrimal glands: Secondary | ICD-10-CM | POA: Diagnosis not present

## 2020-12-23 DIAGNOSIS — Z780 Asymptomatic menopausal state: Secondary | ICD-10-CM | POA: Diagnosis not present

## 2020-12-23 DIAGNOSIS — M85851 Other specified disorders of bone density and structure, right thigh: Secondary | ICD-10-CM | POA: Diagnosis not present

## 2020-12-23 DIAGNOSIS — R922 Inconclusive mammogram: Secondary | ICD-10-CM | POA: Diagnosis not present

## 2020-12-23 DIAGNOSIS — R928 Other abnormal and inconclusive findings on diagnostic imaging of breast: Secondary | ICD-10-CM | POA: Diagnosis not present

## 2020-12-23 DIAGNOSIS — M85852 Other specified disorders of bone density and structure, left thigh: Secondary | ICD-10-CM | POA: Diagnosis not present

## 2020-12-23 DIAGNOSIS — M810 Age-related osteoporosis without current pathological fracture: Secondary | ICD-10-CM | POA: Diagnosis not present

## 2020-12-30 ENCOUNTER — Encounter: Payer: Self-pay | Admitting: Physician Assistant

## 2020-12-30 ENCOUNTER — Other Ambulatory Visit: Payer: Self-pay

## 2020-12-30 ENCOUNTER — Ambulatory Visit (INDEPENDENT_AMBULATORY_CARE_PROVIDER_SITE_OTHER): Payer: Medicare PPO | Admitting: Physician Assistant

## 2020-12-30 DIAGNOSIS — C44722 Squamous cell carcinoma of skin of right lower limb, including hip: Secondary | ICD-10-CM | POA: Diagnosis not present

## 2020-12-30 NOTE — Patient Instructions (Signed)

## 2021-01-18 ENCOUNTER — Encounter: Payer: Self-pay | Admitting: Physician Assistant

## 2021-01-18 NOTE — Progress Notes (Signed)
° °  Follow-Up Visit   Subjective  [name] is a 66 y.o. female who presents for the following: Procedure (Right lower leg-anterior-cis x 1).   The following portions of the chart were reviewed this encounter and updated as appropriate:  Tobacco   Allergies   Meds   Problems   Med Hx   Surg Hx   Fam Hx       Objective  Well appearing patient in no apparent distress; mood and affect are within normal limits.  A focused examination was performed including lower right extremity. Relevant physical exam findings are noted in the Assessment and Plan.  Right Lower Leg - Anterior Scaly pink papule or plaque.    Assessment & Plan  SCC (squamous cell carcinoma), leg, right Right Lower Leg - Anterior  Destruction of lesion Complexity: simple   Destruction method: electrodesiccation and curettage   Informed consent: discussed and consent obtained   Timeout:  patient name, date of birth, surgical site, and procedure verified Anesthesia: the lesion was anesthetized in a standard fashion   Anesthetic:  1% lidocaine w/ epinephrine 1-100,000 local infiltration Curettage performed in three different directions: Yes   Electrodesiccation performed over the curetted area: Yes   Curettage cycles:  3 Final wound size (cm):  1.6 Hemostasis achieved with:  ferric subsulfate Outcome: patient tolerated procedure well with no complications   Additional details:  Wound innoculated with 5 fluorouracil solution.    I, Orell Hurtado, PA-C, have reviewed all documentation's for this visit.  The documentation on 01/18/21 for the exam, diagnosis, procedures and orders are all accurate and complete.

## 2021-02-03 ENCOUNTER — Ambulatory Visit: Payer: Medicare PPO | Admitting: Hematology and Oncology

## 2021-02-03 NOTE — Progress Notes (Signed)
Patient Care Team: [name], NP as PCP - General (Nurse Practitioner) Evans Lance, MD as PCP - Electrophysiology (Cardiology) Buford Dresser, MD as PCP - Cardiology (Cardiology) Nicholas Lose, MD as Consulting Physician (Hematology and Oncology) Kyung Rudd, MD as Consulting Physician (Radiation Oncology) Donnie Mesa, MD as Consulting Physician (General Surgery) Megan Salon, MD as Consulting Physician (Gynecology) Warren Danes, PA-C as Physician Assistant (Dermatology)  DIAGNOSIS:    ICD-10-CM   1. Ductal carcinoma in situ (DCIS) of left breast  D05.12       SUMMARY OF ONCOLOGIC HISTORY: Oncology History  Ductal carcinoma in situ (DCIS) of left breast  12/12/2019 Initial Diagnosis   Screening mammogram on 11/27/19 showed left breast calcifications. 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 She previously underwent a left breast biopsy of the 2:00 position in 2011.   01/22/2020 Surgery   Left lumpectomy ([facility]): high grade DCIS, 1.6cm.    Surgery   Re-excision of positive margin ([facility]): focal residual DCIS 0.2cm from the superior/lateral margin and 0.4cm form the inferior margin.    01/22/2020 Cancer Staging   Staging form: Breast, AJCC 8th Edition - Clinical stage from 01/22/2020: Stage 0 (cTis (DCIS), cN0, cM0, ER+, PR-) - Signed by Gardenia Phlegm, NP on 07/28/2020 Stage prefix: Initial diagnosis    01/22/2020 Cancer Staging   Staging form: Breast, AJCC 8th Edition - Pathologic stage from 01/22/2020: Stage 0 (pTis (DCIS), pN0, cM0) - Signed by Gardenia Phlegm, NP on 07/28/2020 Stage prefix: Initial diagnosis    04/07/2020 - 05/01/2020 Radiation Therapy   Adjuvant radiation   05/10/2020 -  Anti-estrogen oral therapy   Anastrozole daily     CHIEF COMPLIANT: Follow-up of left breast DCIS   INTERVAL HISTORY: [name] is a  66 y.o. with above-mentioned history of left breast DCIS who underwent a left lumpectomy followed by re-excision and completed radiation on 05/01/20, currently on anastrozole. Mammogram on 12/23/2020 showed no evidence of malignancy. She presents to the clinic today for follow-up.   ALLERGIES:  is allergic to other, tape, codeine, fentanyl, and morphine and related.  MEDICATIONS:  Current Outpatient Medications  Medication Sig Dispense Refill   acetaminophen (TYLENOL) 325 MG tablet Take 650 mg by mouth as needed.     amitriptyline (ELAVIL) 25 MG tablet Take 20m by mouth every night  5   anastrozole (ARIMIDEX) 1 MG tablet Take 1 tablet (1 mg total) by mouth daily. 90 tablet 3   CALCIUM PO Take 2 tablets by mouth daily.     cetirizine (ZYRTEC) 10 MG tablet Take 10 mg by mouth daily.     Cyanocobalamin 1000 MCG/ML KIT Inject 1,000 mcg every 30 (thirty) days as directed.     cyclobenzaprine (FLEXERIL) 10 MG tablet 1 tablet     eletriptan (RELPAX) 40 MG tablet Take 40 mg by mouth daily as needed for migraine. May repeat in 2 hours if necessary     ELIQUIS 5 MG TABS tablet Take 1 tablet (5 mg total) by mouth 2 (two) times daily. 180 tablet 3   gabapentin (NEURONTIN) 600 MG tablet Take 600 mg by mouth 2 (two) times daily.     ketoconazole (NIZORAL) 2 % cream Apply to corner of mouth nightly 15 g 0   levothyroxine (SYNTHROID, LEVOTHROID) 100 MCG tablet Take 100 mcg daily before breakfast by mouth.     Melatonin 5  MG CAPS Take 5 mg by mouth at bedtime.     Menthol (BIOFREEZE) 10 % AERO      mupirocin ointment (BACTROBAN) 2 % Apply to leg once to twice daily 22 g 0   nitrofurantoin (MACRODANTIN) 100 MG capsule Take 1 capsule (100 mg total) by mouth daily. Take for suppressive therapy to prevent UTI. 90 capsule 1   Omega-3 Fatty Acids (FISH OIL) 1000 MG CAPS 1 capsule     polyethylene glycol (MIRALAX / GLYCOLAX) packet Take 17 g every evening by mouth.     propranolol (INDERAL) 60 MG tablet Take 1  tablet (60 mg total) by mouth daily. 90 tablet 3   pseudoephedrine (SUDAFED) 30 MG tablet Take 30 mg by mouth as needed for congestion.     spironolactone (ALDACTONE) 25 MG tablet Take 0.5 tablets (12.5 mg total) by mouth daily. 45 tablet 3   Varenicline Tartrate (TYRVAYA) 0.03 MG/ACT SOLN Place into the nose.     VITAMIN D PO Take 2 capsules by mouth daily.     No current facility-administered medications for this visit.    PHYSICAL EXAMINATION: ECOG PERFORMANCE STATUS: 1 - Symptomatic but completely ambulatory  There were no vitals filed for this visit. There were no vitals filed for this visit.  BREAST: No palpable masses or nodules in either right or left breasts. No palpable axillary supraclavicular or infraclavicular adenopathy no breast tenderness or nipple discharge. (exam performed in the presence of a chaperone)  LABORATORY DATA:  I have reviewed the data as listed CMP Latest Ref Rng & Units 02/28/2020 01/20/2020 02/27/2017  Glucose 70 - 99 mg/dL 97 98 102(H)  BUN 8 - 23 mg/dL '18 21 17  ' Creatinine 0.44 - 1.00 mg/dL 0.93 0.98 1.00  Sodium 135 - 145 mmol/L 138 137 140  Potassium 3.5 - 5.1 mmol/L 4.4 4.3 3.7  Chloride 98 - 111 mmol/L 103 100 102  CO2 22 - 32 mmol/L '26 27 26  ' Calcium 8.9 - 10.3 mg/dL 9.5 9.5 9.1  Total Protein 6.5 - 8.1 g/dL - - 6.1(L)  Total Bilirubin 0.3 - 1.2 mg/dL - - 0.5  Alkaline Phos 38 - 126 U/L - - 71  AST 15 - 41 U/L - - 30  ALT 14 - 54 U/L - - 30    Lab Results  Component Value Date   WBC 11.9 (H) 02/27/2017   HGB 13.5 02/27/2017   HCT 41.4 02/27/2017   MCV 95.2 02/27/2017   PLT 306 02/27/2017   NEUTROABS 7.9 (H) 02/27/2017    ASSESSMENT & PLAN:  Ductal carcinoma in situ (DCIS) of left breast 01/22/2020:Left lumpectomy ([facility]): high grade DCIS, 1.6cm.  ER 15% weak, PR 0%, broadly less than 1 mm from medial/superior margin and less than 1 mm from inferior margin, focally less than 1 mm from anterior margin   Treatment plan: Adjuvant  radiation completed 05/01/2020 Adjuvant antiestrogen therapy with anastrozole to start 05/10/2020 (chosen because she has a prior history of splenic infarct)   Anastrozole toxicities: Denies any adverse effects to anastrozole.  Breast cancer surveillance: 1.  Breast exam 02/04/2021: Benign 2. Mammogram 12/23/2020 at [facility]: Benign breast density category C 3.  Bone density 12/23/2020 at [facility]: T score -2.5: Osteoporosis  Osteoporosis: Recommended calcium vitamin D and bisphosphonates After going through all the options she decided to do Reclast.  We will set her up for annual Reclast.  I discussed with her that we may do Reclast 2 years narrow and then give her 2-year  break.  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 Benay Spice at the [facility] because all of her physicians are at the [facility] facility. We will also set her up for the Reclast at that facility  Patient will follow up with Dr. Benay Spice for her annual checkups    No orders of the defined types were placed in this encounter.  The patient has a good understanding of the overall plan. she agrees with it. she will call with any problems that may develop before the next visit here.  Total time spent: 20 mins including face to face time and time spent for planning, charting and coordination of care  Rulon Eisenmenger, MD, MPH 02/04/2021  I, Thana Ates, am acting as scribe for Dr. Nicholas Lose.  I have reviewed the above documentation for accuracy and completeness, and I agree with the above.

## 2021-02-04 ENCOUNTER — Inpatient Hospital Stay: Payer: Medicare PPO | Attending: Hematology and Oncology | Admitting: Hematology and Oncology

## 2021-02-04 ENCOUNTER — Inpatient Hospital Stay: Payer: Medicare PPO

## 2021-02-04 ENCOUNTER — Other Ambulatory Visit: Payer: Self-pay

## 2021-02-04 DIAGNOSIS — Z79811 Long term (current) use of aromatase inhibitors: Secondary | ICD-10-CM | POA: Diagnosis not present

## 2021-02-04 DIAGNOSIS — M810 Age-related osteoporosis without current pathological fracture: Secondary | ICD-10-CM | POA: Insufficient documentation

## 2021-02-04 DIAGNOSIS — Z923 Personal history of irradiation: Secondary | ICD-10-CM | POA: Insufficient documentation

## 2021-02-04 DIAGNOSIS — D0512 Intraductal carcinoma in situ of left breast: Secondary | ICD-10-CM

## 2021-02-04 DIAGNOSIS — Z862 Personal history of diseases of the blood and blood-forming organs and certain disorders involving the immune mechanism: Secondary | ICD-10-CM | POA: Diagnosis not present

## 2021-02-04 DIAGNOSIS — Z7901 Long term (current) use of anticoagulants: Secondary | ICD-10-CM | POA: Diagnosis not present

## 2021-02-04 LAB — CBC WITH DIFFERENTIAL (CANCER CENTER ONLY)
Abs Immature Granulocytes: 0.03 10*3/uL (ref 0.00–0.07)
Basophils Absolute: 0.1 10*3/uL (ref 0.0–0.1)
Basophils Relative: 1 %
Eosinophils Absolute: 0.4 10*3/uL (ref 0.0–0.5)
Eosinophils Relative: 4 %
HCT: 40.4 % (ref 36.0–46.0)
Hemoglobin: 13.5 g/dL (ref 12.0–15.0)
Immature Granulocytes: 0 %
Lymphocytes Relative: 23 %
Lymphs Abs: 2.1 10*3/uL (ref 0.7–4.0)
MCH: 29.7 pg (ref 26.0–34.0)
MCHC: 33.4 g/dL (ref 30.0–36.0)
MCV: 89 fL (ref 80.0–100.0)
Monocytes Absolute: 0.9 10*3/uL (ref 0.1–1.0)
Monocytes Relative: 10 %
Neutro Abs: 5.5 10*3/uL (ref 1.7–7.7)
Neutrophils Relative %: 62 %
Platelet Count: 316 10*3/uL (ref 150–400)
RBC: 4.54 MIL/uL (ref 3.87–5.11)
RDW: 12.9 % (ref 11.5–15.5)
WBC Count: 8.9 10*3/uL (ref 4.0–10.5)
nRBC: 0 % (ref 0.0–0.2)

## 2021-02-04 LAB — CMP (CANCER CENTER ONLY)
ALT: 25 U/L (ref 0–44)
AST: 28 U/L (ref 15–41)
Albumin: 4 g/dL (ref 3.5–5.0)
Alkaline Phosphatase: 78 U/L (ref 38–126)
Anion gap: 5 (ref 5–15)
BUN: 22 mg/dL (ref 8–23)
CO2: 31 mmol/L (ref 22–32)
Calcium: 9.6 mg/dL (ref 8.9–10.3)
Chloride: 105 mmol/L (ref 98–111)
Creatinine: 0.99 mg/dL (ref 0.44–1.00)
GFR, Estimated: >60 mL/min (ref >60)
Glucose, Bld: 100 mg/dL — ABNORMAL HIGH (ref 70–99)
Potassium: 4.3 mmol/L (ref 3.5–5.1)
Sodium: 141 mmol/L (ref 135–145)
Total Bilirubin: 0.4 mg/dL (ref 0.3–1.2)
Total Protein: 7 g/dL (ref 6.5–8.1)

## 2021-02-04 MED ORDER — ANASTROZOLE 1 MG PO TABS: 1.0000 mg | ORAL_TABLET | Freq: Every day | ORAL | 3 refills | Status: DC

## 2021-02-04 NOTE — Assessment & Plan Note (Signed)
01/22/2020:Left lumpectomy ([facility]): high grade DCIS, 1.6cm.ER 15% weak, PR 0%, broadly less than 1 mm from medial/superior margin and less than 1 mm from inferior margin, focally less than 1 mm from anterior margin  Treatment plan: Adjuvant radiation completed 05/01/2020 Adjuvant antiestrogen therapy with anastrozole to start 05/10/2020 (chosen because she has a prior history of splenic infarct)  Anastrozole toxicities:  Breast cancer surveillance: 1.  Breast exam 02/04/2021: Benign 2. Mammogram 12/23/2020 at [facility]: Benign breast density category C 3.  Bone density 12/23/2020 at [facility]: T score -2.5: Osteoporosis  Osteoporosis: Recommended calcium vitamin D and bisphosphonates History of splenic infarct: Completed 3 years of Eliquis April 2022  Return to clinic in 1 year for follow-up

## 2021-02-05 DIAGNOSIS — E538 Deficiency of other specified B group vitamins: Secondary | ICD-10-CM | POA: Diagnosis not present

## 2021-02-09 ENCOUNTER — Other Ambulatory Visit: Payer: Self-pay | Admitting: Hematology and Oncology

## 2021-02-15 ENCOUNTER — Other Ambulatory Visit: Payer: Self-pay | Admitting: Hematology and Oncology

## 2021-02-18 ENCOUNTER — Inpatient Hospital Stay: Payer: Medicare PPO | Attending: Hematology and Oncology

## 2021-02-18 ENCOUNTER — Other Ambulatory Visit: Payer: Self-pay

## 2021-02-18 VITALS — BP 91/52 | HR 54 | Temp 98.0°F | Resp 18 | Ht 67.5 in | Wt 159.2 lb

## 2021-02-18 DIAGNOSIS — M810 Age-related osteoporosis without current pathological fracture: Secondary | ICD-10-CM | POA: Insufficient documentation

## 2021-02-18 DIAGNOSIS — D0512 Intraductal carcinoma in situ of left breast: Secondary | ICD-10-CM | POA: Insufficient documentation

## 2021-02-18 MED ORDER — SODIUM CHLORIDE 0.9 % IV SOLN: Freq: Once | INTRAVENOUS | Status: AC

## 2021-02-18 MED ORDER — ZOLEDRONIC ACID 4 MG/100ML IV SOLN
4.0000 mg | Freq: Once | INTRAVENOUS | Status: AC
  Administered 2021-02-18: 4 mg via INTRAVENOUS
  Filled 2021-02-18: qty 100

## 2021-02-18 NOTE — Patient Instructions (Signed)

## 2021-02-23 ENCOUNTER — Ambulatory Visit (INDEPENDENT_AMBULATORY_CARE_PROVIDER_SITE_OTHER): Payer: Medicare PPO

## 2021-02-23 DIAGNOSIS — I428 Other cardiomyopathies: Secondary | ICD-10-CM

## 2021-02-23 LAB — CUP PACEART REMOTE DEVICE CHECK
Battery Remaining Longevity: 73 mo
Battery Voltage: 2.97 V
Brady Statistic AP VP Percent: 2.57 %
Brady Statistic AP VS Percent: 0.07 %
Brady Statistic AS VP Percent: 95.48 %
Brady Statistic AS VS Percent: 1.88 %
Brady Statistic RA Percent Paced: 2.66 %
Brady Statistic RV Percent Paced: 5.25 %
Date Time Interrogation Session: 20230214033316
Implantable Lead Implant Date: 20130110
Implantable Lead Implant Date: 20130110
Implantable Lead Implant Date: 20130110
Implantable Lead Location: 753858
Implantable Lead Location: 753859
Implantable Lead Location: 753860
Implantable Lead Model: 4196
Implantable Lead Model: 5076
Implantable Lead Model: 6935
Implantable Pulse Generator Implant Date: 20181115
Lead Channel Impedance Value: 1330 Ohm
Lead Channel Impedance Value: 342 Ohm
Lead Channel Impedance Value: 418 Ohm
Lead Channel Impedance Value: 627 Ohm
Lead Channel Impedance Value: 741 Ohm
Lead Channel Impedance Value: 760 Ohm
Lead Channel Impedance Value: 779 Ohm
Lead Channel Impedance Value: 798 Ohm
Lead Channel Impedance Value: 912 Ohm
Lead Channel Pacing Threshold Amplitude: 0.625 V
Lead Channel Pacing Threshold Amplitude: 1.125 V
Lead Channel Pacing Threshold Amplitude: 2.375 V
Lead Channel Pacing Threshold Pulse Width: 0.4 ms
Lead Channel Pacing Threshold Pulse Width: 0.4 ms
Lead Channel Pacing Threshold Pulse Width: 0.8 ms
Lead Channel Sensing Intrinsic Amplitude: 1.125 mV
Lead Channel Sensing Intrinsic Amplitude: 1.125 mV
Lead Channel Sensing Intrinsic Amplitude: 17 mV
Lead Channel Sensing Intrinsic Amplitude: 17 mV
Lead Channel Setting Pacing Amplitude: 2 V
Lead Channel Setting Pacing Amplitude: 2.25 V
Lead Channel Setting Pacing Amplitude: 4 V
Lead Channel Setting Pacing Pulse Width: 0.8 ms
Lead Channel Setting Pacing Pulse Width: 1 ms
Lead Channel Setting Sensing Sensitivity: 0.9 mV

## 2021-03-01 NOTE — Progress Notes (Signed)
Remote pacemaker transmission.   

## 2021-03-12 DIAGNOSIS — E538 Deficiency of other specified B group vitamins: Secondary | ICD-10-CM | POA: Diagnosis not present

## 2021-03-15 DIAGNOSIS — M25572 Pain in left ankle and joints of left foot: Secondary | ICD-10-CM | POA: Diagnosis not present

## 2021-03-15 DIAGNOSIS — M21612 Bunion of left foot: Secondary | ICD-10-CM | POA: Diagnosis not present

## 2021-03-15 DIAGNOSIS — M7752 Other enthesopathy of left foot: Secondary | ICD-10-CM | POA: Diagnosis not present

## 2021-03-29 DIAGNOSIS — M25572 Pain in left ankle and joints of left foot: Secondary | ICD-10-CM | POA: Diagnosis not present

## 2021-03-29 DIAGNOSIS — M7752 Other enthesopathy of left foot: Secondary | ICD-10-CM | POA: Diagnosis not present

## 2021-04-07 ENCOUNTER — Encounter: Payer: Self-pay | Admitting: Internal Medicine

## 2021-04-07 ENCOUNTER — Ambulatory Visit (INDEPENDENT_AMBULATORY_CARE_PROVIDER_SITE_OTHER): Payer: Medicare PPO

## 2021-04-07 ENCOUNTER — Ambulatory Visit: Payer: Medicare PPO | Admitting: Internal Medicine

## 2021-04-07 ENCOUNTER — Other Ambulatory Visit: Payer: Self-pay

## 2021-04-07 DIAGNOSIS — R0602 Shortness of breath: Secondary | ICD-10-CM | POA: Diagnosis not present

## 2021-04-07 DIAGNOSIS — R0609 Other forms of dyspnea: Secondary | ICD-10-CM | POA: Insufficient documentation

## 2021-04-07 DIAGNOSIS — R059 Cough, unspecified: Secondary | ICD-10-CM | POA: Diagnosis not present

## 2021-04-07 LAB — BASIC METABOLIC PANEL
BUN: 26 mg/dL — ABNORMAL HIGH (ref 6–23)
CO2: 29 mEq/L (ref 19–32)
Calcium: 9.9 mg/dL (ref 8.4–10.5)
Chloride: 104 mEq/L (ref 96–112)
Creatinine, Ser: 0.98 mg/dL (ref 0.40–1.20)
GFR: 59.74 mL/min — ABNORMAL LOW (ref >60.00)
Glucose, Bld: 95 mg/dL (ref 70–99)
Potassium: 4.7 mEq/L (ref 3.5–5.1)
Sodium: 139 mEq/L (ref 135–145)

## 2021-04-07 LAB — SEDIMENTATION RATE: Sed Rate: 18 mm/hr (ref 0–30)

## 2021-04-07 LAB — CBC WITH DIFFERENTIAL/PLATELET
Basophils Absolute: 0.1 10*3/uL (ref 0.0–0.1)
Basophils Relative: 0.6 % (ref 0.0–3.0)
Eosinophils Absolute: 0.2 10*3/uL (ref 0.0–0.7)
Eosinophils Relative: 2.3 % (ref 0.0–5.0)
HCT: 42 % (ref 36.0–46.0)
Hemoglobin: 14 g/dL (ref 12.0–15.0)
Lymphocytes Relative: 19.8 % (ref 12.0–46.0)
Lymphs Abs: 1.9 10*3/uL (ref 0.7–4.0)
MCHC: 33.4 g/dL (ref 30.0–36.0)
MCV: 90.6 fl (ref 78.0–100.0)
Monocytes Absolute: 0.9 10*3/uL (ref 0.1–1.0)
Monocytes Relative: 9.8 % (ref 3.0–12.0)
Neutro Abs: 6.4 10*3/uL (ref 1.4–7.7)
Neutrophils Relative %: 67.5 % (ref 43.0–77.0)
Platelets: 312 10*3/uL (ref 150.0–400.0)
RBC: 4.63 Mil/uL (ref 3.87–5.11)
RDW: 13 % (ref 11.5–15.5)
WBC: 9.5 10*3/uL (ref 4.0–10.5)

## 2021-04-07 LAB — BRAIN NATRIURETIC PEPTIDE: Pro B Natriuretic peptide (BNP): 28 pg/mL (ref 0.0–100.0)

## 2021-04-07 LAB — TSH: TSH: 2.48 u[IU]/mL (ref 0.35–5.50)

## 2021-04-07 MED ORDER — PANTOPRAZOLE SODIUM 40 MG PO TBEC: 40.0000 mg | DELAYED_RELEASE_TABLET | Freq: Every day | ORAL | 2 refills | Status: DC

## 2021-04-07 MED ORDER — FAMOTIDINE 20 MG PO TABS: ORAL_TABLET | ORAL | 11 refills | Status: DC

## 2021-04-07 NOTE — Patient Instructions (Signed)
Pantoprazole (protonix) 40 mg   Take  30-60 min before first meal of the day and Pepcid (famotidine)  20 mg after supper until return to office - this is the best way to tell whether stomach acid is contributing to your problem.   ? ?GERD (REFLUX)  is an extremely common cause of respiratory symptoms just like yours , many times with no obvious heartburn at all.  ? ? It can be treated with medication, but also with lifestyle changes including elevation of the head of your bed (ideally with 6 -8inch blocks under the headboard of your bed),  Smoking cessation, avoidance of late meals, excessive alcohol, and avoid fatty foods, chocolate, peppermint, colas, red wine, and acidic juices such as orange juice.  ?NO MINT OR MENTHOL PRODUCTS SO NO COUGH DROPS  ?USE SUGARLESS CANDY INSTEAD (Jolley ranchers or Stover's or Life Savers) or even ice chips will also do - the key is to swallow to prevent all throat clearing. ?NO OIL BASED VITAMINS - use powdered substitutes.  Avoid fish oil when coughing.  ? ?Please remember to go to the lab and x-ray department  for your tests - we will call you with the results when they are available. ?     ? ?Keep walking as much as possible to maintain fitness  ? ? ?Please schedule a follow up office visit in 6 weeks, call sooner if needed  ?

## 2021-04-07 NOTE — Progress Notes (Addendum)
? ?[name], female    DOB: 07-11-1955     MRN: [id number] ? ? ?Brief patient profile:  ?63 yowf never smoker referred to pulmonary clinic 04/07/2021 by [name] for cough and doe ? Onset 08/2020 ?  ? ?Seen for asp perioperatively in 2012  ? ?History of Present Illness  ?04/07/2021  Pulmonary/ 1st office eval/[name] on macrodantin  ?Chief Complaint  ?Patient presents with  ? Consult  ?  SOB for 6-7 months, has gotten worse.  She is trying to walk, she cannot talk while she walks.  She has to stop while going up a hill and if she gets in a hurry, she gets out of breath.  ?Dyspnea:  walks dog regularly and now going uphill or using voice or rushing sob  ?Does [employer] shopping fine slower than other people ?Cough: new with onset of doe assoc rattle, non productive assoc dysphagia  ?Sleep: nasal congestion  ?SABA use: none ? ?No obvious day to day or daytime variability or assoc excess/ purulent sputum or mucus plugs or hemoptysis or cp or chest tightness, subjective wheeze or overt sinus or hb symptoms.  ? ?  Also denies any obvious fluctuation of symptoms with weather or environmental changes or other aggravating or alleviating factors except as outlined above  ? ?No unusual exposure hx or h/o childhood pna/ asthma or knowledge of premature birth. ? ?Current Allergies, Complete Past Medical History, Past Surgical History, Family History, and Social History were reviewed in [employer] record. ? ?ROS  The following are not active complaints unless bolded ?Hoarseness, sore throat, dysphagia, dental problems, itching, sneezing,  nasal congestion or discharge of excess mucus or purulent secretions, ear ache,   fever, chills, sweats, unintended wt loss or wt gain, classically pleuritic or exertional cp,  orthopnea pnd or arm/hand swelling  or leg swelling, presyncope, palpitations, abdominal pain, anorexia, nausea, vomiting, diarrhea  or change in bowel habits or change in bladder  habits, change in stools or change in urine, dysuria, hematuria,  rash, arthralgias, visual complaints, headache, numbness, weakness or ataxia or problems with walking or coordination,  change in mood or  memory. ?      ?   ? ?Past Medical History:  ?Diagnosis Date  ? AF (paroxysmal atrial fibrillation) ([place])   ? Arthritis   ? BCC (basal cell carcinoma) 03/11/2003  ? left distal lower leg ankle-tx dr Tonia Brooms  ? Biventricular cardiac pacemaker in situ   ? a. prior CRT-D in 2013 with downgrade to CRT-Pacemaker only in 11/2016.  ? Breast cancer ([place]) 12/12/2019  ? Chronic systolic CHF (congestive heart failure) ([place])   ? Complication of anesthesia   ? aspiration  ? Essential tremor   ? GERD (gastroesophageal reflux disease)   ? otc  ? Hypertension   ? Hypothyroidism   ? LBBB (left bundle branch block)   ? conduction disorder  ? Migraines   ? Mild aortic insufficiency   ? NICM (nonischemic cardiomyopathy) ([place])   ? a. 2012: normal coronaries, EF 25-30%. b. improved to 55% 11/2016.  ? Osteoporosis   ? Pleural effusion, right   ? thoracentesis 02/09/09   ? PONV (postoperative nausea and vomiting)   ? Raynaud's disease   ? SCC (squamous cell carcinoma) 08/20/2018  ? right upper lip-mohs [place]am[place]kov  ? SCC (squamous cell carcinoma) 09/08/2020  ? in situ- right lower leg-anterior (CX35FU)  ? Scoliosis   ? ? ?Outpatient Medications Prior to Visit  ?Medication Sig Dispense Refill  ?  acetaminophen (TYLENOL) 325 MG tablet Take 650 mg by mouth as needed.    ? amitriptyline (ELAVIL) 25 MG tablet Take 68m by mouth every night  5  ? anastrozole (ARIMIDEX) 1 MG tablet Take 1 tablet (1 mg total) by mouth daily. 90 tablet 3  ? CALCIUM PO Take 2 tablets by mouth daily.    ? cetirizine (ZYRTEC) 10 MG tablet Take 10 mg by mouth daily.    ? Cyanocobalamin 1000 MCG/ML KIT Inject 1,000 mcg every 30 (thirty) days as directed.    ? ELIQUIS 5 MG TABS tablet Take 1 tablet (5 mg total) by mouth 2 (two) times daily. 180 tablet 3  ? gabapentin  (NEURONTIN) 600 MG tablet Take 600 mg by mouth 2 (two) times daily.    ? levothyroxine (SYNTHROID, LEVOTHROID) 100 MCG tablet Take 100 mcg daily before breakfast by mouth.    ? Melatonin 5 MG CAPS Take 5 mg by mouth at bedtime.    ? mupirocin ointment (BACTROBAN) 2 % Apply to leg once to twice daily 22 g 0  ? nitrofurantoin (MACRODANTIN) 100 MG capsule Take 1 capsule (100 mg total) by mouth daily. Take for suppressive therapy to prevent UTI. 90 capsule 1  ? Omega-3 Fatty Acids (FISH OIL) 1000 MG CAPS 1 capsule    ? polyethylene glycol (MIRALAX / GLYCOLAX) packet Take 17 g every evening by mouth.    ? propranolol (INDERAL) 60 MG tablet Take 1 tablet (60 mg total) by mouth daily. 90 tablet 3  ? spironolactone (ALDACTONE) 25 MG tablet Take 0.5 tablets (12.5 mg total) by mouth daily. 45 tablet 3  ? Varenicline Tartrate (TYRVAYA) 0.03 MG/ACT SOLN Place into the nose.    ? VITAMIN D PO Take 2 capsules by mouth daily.    ? cyclobenzaprine (FLEXERIL) 10 MG tablet 1 tablet (Patient not taking: Reported on 04/07/2021)    ? eletriptan (RELPAX) 40 MG tablet Take 40 mg by mouth daily as needed for migraine. May repeat in 2 hours if necessary (Patient not taking: Reported on 04/07/2021)    ? ketoconazole (NIZORAL) 2 % cream Apply to corner of mouth nightly (Patient not taking: Reported on 04/07/2021) 15 g 0  ? Menthol (BIOFREEZE) 10 % AERO  (Patient not taking: Reported on 04/07/2021)    ? pseudoephedrine (SUDAFED) 30 MG tablet Take 30 mg by mouth as needed for congestion. (Patient not taking: Reported on 04/07/2021)    ? ?No facility-administered medications prior to visit.  ? ? ? ?Objective:  ?  ? ?BP 104/68 (BP Location: Left Arm, Patient Position: Sitting, Cuff Size: Normal)   Pulse (!) 56   Temp 97.6 ?F (36.4 ?C) (Oral)   Ht 5' 7.5" (1.715 m)   Wt 156 lb 12.8 oz (71.1 kg)   SpO2 97%   BMI 24.20 kg/m?  ? ?SpO2: 97 % ? ?Amb wf nad ? ? HEENT : pt wearing mask not removed for exam due to covid -19 concerns.  ? ? ?NECK :   without JVD/Nodes/TM/ nl carotid upstrokes bilaterally ? ? ?LUNGS: no acc muscle use,  Nl contour chest which is clear to A and P bilaterally without cough on insp or exp maneuvers ? ? ?CV:  RRR  no s3 or murmur or increase in P2, and no edema  ? ?ABD:  soft and nontender with nl inspiratory excursion in the supine position. No bruits or organomegaly appreciated, bowel sounds nl ? ?MS:  Nl gait/ ext warm without deformities, calf tenderness, cyanosis or  clubbing ?No obvious joint restrictions  ? ?SKIN: warm and dry without lesions   ? ?NEURO:  alert, approp, nl sensorium with  no motor or cerebellar deficits apparent.  ? ? ?CXR PA and Lateral:   04/07/2021 :    ?I personally reviewed images and agree with radiology impression as follows:    ?There are no new infiltrates or signs of pulmonary edema. ? ?Labs ordered/ reviewed:  ? ? ?  Chemistry   ?   ?Component Value Date/Time  ? NA 139 04/07/2021 1149  ? NA 142 11/16/2016 1031  ? K 4.7 04/07/2021 1149  ? CL 104 04/07/2021 1149  ? CO2 29 04/07/2021 1149  ? BUN 26 (H) 04/07/2021 1149  ? BUN 25 11/16/2016 1031  ? CREATININE 0.98 04/07/2021 1149  ? CREATININE 0.99 02/04/2021 1428  ?    ?Component Value Date/Time  ? CALCIUM 9.9 04/07/2021 1149  ? ALKPHOS 78 02/04/2021 1428  ? AST 28 02/04/2021 1428  ? ALT 25 02/04/2021 1428  ? BILITOT 0.4 02/04/2021 1428  ?  ? ?  ? ?Lab Results  ?Component Value Date  ? WBC 9.5 04/07/2021  ? HGB 14.0 04/07/2021  ? HCT 42.0 04/07/2021  ? MCV 90.6 04/07/2021  ? PLT 312.0 04/07/2021  ?     EOS                                                              0.2                                   04/07/2021  ?  ? ?Lab Results  ?Component Value Date  ? DDIMER 0.45 04/07/2021  ?  ?  ?Lab Results  ?Component Value Date  ? TSH 2.48 04/07/2021  ?  ? ?Lab Results  ?Component Value Date  ? PROBNP 28.0 04/07/2021  ?  ? ?  ?Lab Results  ?Component Value Date  ? ESRSEDRATE 18 04/07/2021  ? ESRSEDRATE 55 (H) 02/12/2010  ? ESRSEDRATE 36 (H) 06/11/2008  ?  ?    ? ? ?   ?Assessment  ? ?DOE (dyspnea on exertion) ?Onset ? 08/2020  ?- Echo 08/12/20 ok x mild AR with nl LA ?- 04/07/2021   Walked on RA  x  3  lap(s) =  approx 750  ft  @ mod to fast pace, stopped due to end of study with lowe

## 2021-04-08 LAB — IGE: IgE (Immunoglobulin E), Serum: 7 kU/L (ref <=114)

## 2021-04-08 LAB — D-DIMER, QUANTITATIVE: D-Dimer, Quant: 0.45 mcg/mL FEU (ref <0.50)

## 2021-04-09 NOTE — Assessment & Plan Note (Addendum)
Onset ? 08/2020  ?- Echo 08/12/20 ok x mild AR with nl LA ?- 04/07/2021   Walked on RA  x  3  lap(s) =  approx 750  ft  @ mod to fast pace, stopped due to end of study with lowest 02 sats 96% and mild sob   ? ?Symptoms are markedly disproportionate to objective findings and not clear to what extent this is actually a pulmonary  problem but pt does appear to have difficult to sort out respiratory symptoms of unknown origin for which  DDX  = almost all start with A and  include Adherence, Ace Inhibitors, Acid Reflux, Active Sinus Disease, Alpha 1 Antitripsin deficiency, Anxiety masquerading as Airways dz,  ABPA,  Allergy(esp in young), Aspiration (esp in elderly), Adverse effects of meds,  Active smoking or Vaping, A bunch of PE's/clot burden (a few small clots can't cause this syndrome unless there is already severe underlying pulm or vascular dz with poor reserve),  Anemia or thyroid disorder, plus two Bs  = Bronchiectasis and Beta blocker use..and one C= CHF  ?  ? ?Adherence is always the initial "prime suspect" and is a multilayered concern that requires a "trust but verify" approach in every patient - starting with knowing how to use medications, especially inhalers, correctly, keeping up with refills and understanding the fundamental difference between maintenance and prns vs those medications only taken for a very short course and then stopped and not refilled.  ? ?? Acid (or non-acid) GERD > always difficult to exclude as up to 75% of pts in some series report no assoc GI/ Heartburn symptoms> rec max (24h)  acid suppression and diet restrictions/ reviewed and instructions given in writing.  ? ?? Anemia / thyroid dz > ruled out  ? ?? Allergy/asthma  >  Nl IgE Eos nl but note on inderal for tremor so hard to exclude completely  ? ?? Anxiety/depression/ deconditioning > usually at the bottom of this list of usual suspects but should be much higher on this pt's based on H and P and note already on psychotropics and  may interfere with adherence and also interpretation of response or lack thereof to symptom management which can be quite subjective. ? ?? A bunch of PEs > on eliquis since prior to onset so unlikely  ? ?? chf > mild cm on cxr but echo ok 08/2020 x mild MR with Nl LA and  bnp quite low   ? ?rec sub max ex building to 30 min daily and f/u in 6 weeks > cpst next step if not improving  ? ? ?Medical decision making was a high level of complexity in this case because of a chronic condition/diagnosis with severe exacerbation progression side effects of treatment  requiring extra time for  H and P, chart review, counseling, and generating customized AVS unique to this office visit and next day charting for pt not seen in over 3 years. ?  ?Each maintenance medication was reviewed in detail including emphasizing most importantly the difference between maintenance and prns and under what circumstances the prns are to be triggered using an action plan format where appropriate. Please see avs for details which were reviewed in writing by both me and my nurse and patient given a written copy highlighted where appropriate with yellow highlighter for the patient's continued care at home along with an updated version of their medications.  Patient was asked to maintain medication reconciliation by comparing this list to the actual medications  being used at home and to contact this office right away if there is a conflict or discrepancy.   ? ?     ?  ?  ?       ?

## 2021-04-10 ENCOUNTER — Encounter: Payer: Self-pay | Admitting: Internal Medicine

## 2021-04-12 DIAGNOSIS — M71571 Other bursitis, not elsewhere classified, right ankle and foot: Secondary | ICD-10-CM | POA: Diagnosis not present

## 2021-04-12 DIAGNOSIS — M2041 Other hammer toe(s) (acquired), right foot: Secondary | ICD-10-CM | POA: Diagnosis not present

## 2021-04-13 DIAGNOSIS — E538 Deficiency of other specified B group vitamins: Secondary | ICD-10-CM | POA: Diagnosis not present

## 2021-04-20 ENCOUNTER — Encounter: Payer: Self-pay | Admitting: Hematology and Oncology

## 2021-04-21 ENCOUNTER — Encounter: Payer: Self-pay | Admitting: Hematology and Oncology

## 2021-04-23 ENCOUNTER — Other Ambulatory Visit: Payer: Self-pay | Admitting: Podiatry

## 2021-04-23 ENCOUNTER — Ambulatory Visit: Payer: Medicare PPO | Admitting: Podiatry

## 2021-04-23 ENCOUNTER — Ambulatory Visit (INDEPENDENT_AMBULATORY_CARE_PROVIDER_SITE_OTHER): Payer: Medicare PPO

## 2021-04-23 DIAGNOSIS — M79675 Pain in left toe(s): Secondary | ICD-10-CM

## 2021-04-23 DIAGNOSIS — M2041 Other hammer toe(s) (acquired), right foot: Secondary | ICD-10-CM

## 2021-04-23 DIAGNOSIS — M205X2 Other deformities of toe(s) (acquired), left foot: Secondary | ICD-10-CM

## 2021-04-23 DIAGNOSIS — M79674 Pain in right toe(s): Secondary | ICD-10-CM

## 2021-04-23 DIAGNOSIS — B351 Tinea unguium: Secondary | ICD-10-CM

## 2021-04-27 NOTE — Progress Notes (Signed)
?  Subjective:  ?Patient ID: [name], female    DOB: [birth year],  MRN: [id number] ? ?Chief Complaint  ?Patient presents with  ? Nail Problem  ? Hammer Toe  ? ?66 y.o. female returns for the above complaint.  Patient presents with thickened elongated dystrophic toenails x10 mild pain on palpation.  Patient would like for me to debride down.  She is not able to do it herself.  She denies any other acute complaints. ? ?Objective:  ?There were no vitals filed for this visit. ?Podiatric Exam: ?Vascular: dorsalis pedis and posterior tibial pulses are palpable bilateral. Capillary return is immediate. Temperature gradient is WNL. Skin turgor WNL  ?Sensorium: Normal Semmes Weinstein monofilament test. Normal tactile sensation bilaterally. ?Nail Exam: Pt has thick disfigured discolored nails with subungual debris noted bilateral entire nail hallux through fifth toenails.  Pain on palpation to the nails. ?Ulcer Exam: There is no evidence of ulcer or pre-ulcerative changes or infection. ?Orthopedic Exam: Muscle tone and strength are WNL. No limitations in general ROM. No crepitus or effusions noted.  Left hallux limitus noted.  Pain on palpation mildly. ?Skin: No Porokeratosis. No infection or ulcers ? ? ? ?Assessment & Plan:  ? ?1. Hallux limitus of left foot   ?2. Pain due to onychomycosis of toenails of both feet   ? ? ?Patient was evaluated and treated and all questions answered. ? ?Onychomycosis with pain  ?-Nails palliatively debrided as below. ?-Educated on self-care ? ?Procedure: Nail Debridement ?Rationale: pain  ?Type of Debridement: manual, sharp debridement. ?Instrumentation: Nail nipper, rotary burr. ?Number of Nails: 10 ? ?Procedures and Treatment: Consent by patient was obtained for treatment procedures. The patient understood the discussion of treatment and procedures well. All questions were answered thoroughly reviewed. Debridement of mycotic and hypertrophic toenails, 1 through 5 bilateral and  clearing of subungual debris. No ulceration, no infection noted.  ?Return Visit-Office Procedure: Patient instructed to return to the office for a follow up visit 3 months for continued evaluation and treatment. ? ?Boneta Lucks, DPM ?  ? ?No follow-ups on file. ? ?

## 2021-04-29 ENCOUNTER — Encounter (HOSPITAL_COMMUNITY): Payer: Self-pay

## 2021-05-06 ENCOUNTER — Encounter: Payer: Self-pay | Admitting: Physician Assistant

## 2021-05-06 ENCOUNTER — Ambulatory Visit: Payer: Medicare PPO | Admitting: Physician Assistant

## 2021-05-06 DIAGNOSIS — L820 Inflamed seborrheic keratosis: Secondary | ICD-10-CM | POA: Diagnosis not present

## 2021-05-06 DIAGNOSIS — L570 Actinic keratosis: Secondary | ICD-10-CM | POA: Diagnosis not present

## 2021-05-12 DIAGNOSIS — E538 Deficiency of other specified B group vitamins: Secondary | ICD-10-CM | POA: Diagnosis not present

## 2021-05-13 DIAGNOSIS — F5101 Primary insomnia: Secondary | ICD-10-CM | POA: Diagnosis not present

## 2021-05-13 DIAGNOSIS — R251 Tremor, unspecified: Secondary | ICD-10-CM | POA: Diagnosis not present

## 2021-05-13 DIAGNOSIS — G603 Idiopathic progressive neuropathy: Secondary | ICD-10-CM | POA: Diagnosis not present

## 2021-05-13 DIAGNOSIS — M5417 Radiculopathy, lumbosacral region: Secondary | ICD-10-CM | POA: Diagnosis not present

## 2021-05-13 DIAGNOSIS — M5412 Radiculopathy, cervical region: Secondary | ICD-10-CM | POA: Diagnosis not present

## 2021-05-13 DIAGNOSIS — G518 Other disorders of facial nerve: Secondary | ICD-10-CM | POA: Diagnosis not present

## 2021-05-17 ENCOUNTER — Other Ambulatory Visit: Payer: Self-pay | Admitting: Cardiology

## 2021-05-17 ENCOUNTER — Encounter: Payer: Self-pay | Admitting: Physician Assistant

## 2021-05-17 NOTE — Progress Notes (Signed)
? ?  Follow-Up Visit ?  ?Subjective  ?[name] is a 66 y.o. female who presents for the following: Skin Problem (Patient here today for lesion on her left shin that comes and goes x 1 month no bleeding, per patient it does have a burning sensation. Check lesion on left posterior neck x 1 year that doesn't go away, per patient if she picks at the lesion it does bleed. ). ? ? ?The following portions of the chart were reviewed this encounter and updated as appropriate:  Tobacco  Allergies  Meds  Problems  Med Hx  Surg Hx  Fam Hx   ?  ? ?Objective  ?Well appearing patient in no apparent distress; mood and affect are within normal limits. ? ?A full examination was performed including scalp, head, eyes, ears, nose, lips, neck, chest, axillae, abdomen, back, buttocks, bilateral upper extremities, bilateral lower extremities, hands, feet, fingers, toes, fingernails, and toenails. All findings within normal limits unless otherwise noted below. ? ?Dorsum of Nose (2), Left Lower Leg - Anterior, Neck - Posterior (3), Right Lower Leg - Anterior, Right Upper Back ?Erythematous patches with gritty scale. ? ?Left Forehead, Right Temple ?Stuck-on, waxy papules and plaques.  ? ? ?Assessment & Plan  ?AK (actinic keratosis) (8) ?Neck - Posterior (3); Left Lower Leg - Anterior; Right Lower Leg - Anterior; Right Upper Back; Dorsum of Nose (2) ? ?Destruction of lesion - Dorsum of Nose, Left Lower Leg - Anterior, Neck - Posterior, Right Lower Leg - Anterior, Right Upper Back ?Complexity: simple   ?Destruction method: cryotherapy   ?Informed consent: discussed and consent obtained   ?Timeout:  patient name, date of birth, surgical site, and procedure verified ?Lesion destroyed using liquid nitrogen: Yes   ?Cryotherapy cycles:  3 ?Outcome: patient tolerated procedure well with no complications   ? ?Seborrheic keratosis, inflamed (2) ?Left Forehead; Right Temple ? ?Destruction of lesion - Left Forehead, Right Temple ?Complexity:  simple   ?Destruction method: cryotherapy   ?Informed consent: discussed and consent obtained   ?Timeout:  patient name, date of birth, surgical site, and procedure verified ?Lesion destroyed using liquid nitrogen: Yes   ?Cryotherapy cycles:  3 ?Outcome: patient tolerated procedure well with no complications   ? ?Notalgia paresthetica- shins- intermittent. No treatment.  ? ?I, Dana Frakes, PA-C, have reviewed all documentation's for this visit.  The documentation on 05/17/21 for the exam, diagnosis, procedures and orders are all accurate and complete. ?

## 2021-05-18 NOTE — Telephone Encounter (Signed)
Rx(s) sent to pharmacy electronically.  

## 2021-05-25 ENCOUNTER — Ambulatory Visit (INDEPENDENT_AMBULATORY_CARE_PROVIDER_SITE_OTHER): Payer: Medicare PPO

## 2021-05-25 DIAGNOSIS — I428 Other cardiomyopathies: Secondary | ICD-10-CM | POA: Diagnosis not present

## 2021-05-25 LAB — CUP PACEART REMOTE DEVICE CHECK
Battery Remaining Longevity: 69 mo
Battery Voltage: 2.97 V
Brady Statistic AP VP Percent: 3.13 %
Brady Statistic AP VS Percent: 0.08 %
Brady Statistic AS VP Percent: 95.06 %
Brady Statistic AS VS Percent: 1.72 %
Brady Statistic RA Percent Paced: 3.22 %
Brady Statistic RV Percent Paced: 5.3 %
Date Time Interrogation Session: 20230515212805
Implantable Lead Implant Date: 20130110
Implantable Lead Implant Date: 20130110
Implantable Lead Implant Date: 20130110
Implantable Lead Location: 753858
Implantable Lead Location: 753859
Implantable Lead Location: 753860
Implantable Lead Model: 4196
Implantable Lead Model: 5076
Implantable Lead Model: 6935
Implantable Pulse Generator Implant Date: 20181115
Lead Channel Impedance Value: 1311 Ohm
Lead Channel Impedance Value: 342 Ohm
Lead Channel Impedance Value: 399 Ohm
Lead Channel Impedance Value: 589 Ohm
Lead Channel Impedance Value: 703 Ohm
Lead Channel Impedance Value: 779 Ohm
Lead Channel Impedance Value: 798 Ohm
Lead Channel Impedance Value: 817 Ohm
Lead Channel Impedance Value: 912 Ohm
Lead Channel Pacing Threshold Amplitude: 0.5 V
Lead Channel Pacing Threshold Amplitude: 1.25 V
Lead Channel Pacing Threshold Amplitude: 2.375 V
Lead Channel Pacing Threshold Pulse Width: 0.4 ms
Lead Channel Pacing Threshold Pulse Width: 0.4 ms
Lead Channel Pacing Threshold Pulse Width: 0.8 ms
Lead Channel Sensing Intrinsic Amplitude: 0.875 mV
Lead Channel Sensing Intrinsic Amplitude: 0.875 mV
Lead Channel Sensing Intrinsic Amplitude: 14.875 mV
Lead Channel Sensing Intrinsic Amplitude: 14.875 mV
Lead Channel Setting Pacing Amplitude: 2 V
Lead Channel Setting Pacing Amplitude: 2.25 V
Lead Channel Setting Pacing Amplitude: 4 V
Lead Channel Setting Pacing Pulse Width: 0.8 ms
Lead Channel Setting Pacing Pulse Width: 1 ms
Lead Channel Setting Sensing Sensitivity: 0.9 mV

## 2021-05-31 ENCOUNTER — Ambulatory Visit: Payer: Medicare PPO | Admitting: Internal Medicine

## 2021-06-02 ENCOUNTER — Encounter (HOSPITAL_BASED_OUTPATIENT_CLINIC_OR_DEPARTMENT_OTHER): Payer: Self-pay | Admitting: Cardiology

## 2021-06-02 ENCOUNTER — Ambulatory Visit (HOSPITAL_BASED_OUTPATIENT_CLINIC_OR_DEPARTMENT_OTHER): Payer: Medicare PPO | Admitting: Cardiology

## 2021-06-02 VITALS — BP 108/70 | HR 49 | Ht 67.5 in | Wt 153.8 lb

## 2021-06-02 DIAGNOSIS — R0609 Other forms of dyspnea: Secondary | ICD-10-CM

## 2021-06-02 DIAGNOSIS — Z950 Presence of cardiac pacemaker: Secondary | ICD-10-CM | POA: Diagnosis not present

## 2021-06-02 DIAGNOSIS — Z7901 Long term (current) use of anticoagulants: Secondary | ICD-10-CM

## 2021-06-02 DIAGNOSIS — I10 Essential (primary) hypertension: Secondary | ICD-10-CM | POA: Diagnosis not present

## 2021-06-02 DIAGNOSIS — I428 Other cardiomyopathies: Secondary | ICD-10-CM

## 2021-06-02 DIAGNOSIS — I480 Paroxysmal atrial fibrillation: Secondary | ICD-10-CM

## 2021-06-02 NOTE — Progress Notes (Signed)
Cardiology Office Note:    Date:  06/02/2021   ID:  [name], DOB Jul 02, 1955, MRN [id number]  PCP:  [name], NP  Cardiologist:  Buford Dresser, MD  Referring MD: [name], NP   CC: follow up  History of Present Illness:    [name] is a 66 y.o. female with a hx of LBBB, NICM s/p CRT-D-->P with normalization of EF, paroxysmal atrial fibrillation.   Today: Overall doing well. Has chronic constipation, uses miralax nightly. Stools are hard even with this. Discussed bowel regimens today. No hematochezia or melena.  Breathing is improved. Walks at least a mile every day with her dog.   Feels afib occasionally at night, can last 5-10 mins. Tolerating apixaban.   Has been on slimfast, has lost >10 lbs. BMI 23 today. Doing this to support her husband's weight loss.   Denies chest pain, shortness of breath at rest or with normal exertion. No PND, orthopnea, LE edema or unexpected weight gain. No syncope or palpitations.   Past Medical History:  Diagnosis Date   AF (paroxysmal atrial fibrillation) (HCC)    Arthritis    BCC (basal cell carcinoma) 03/11/2003   left distal lower leg ankle-tx dr Tonia Brooms   Biventricular cardiac pacemaker in situ    a. prior CRT-D in 2013 with downgrade to CRT-Pacemaker only in 11/2016.   Breast cancer ([place]) 26/71/2458   Chronic systolic CHF (congestive heart failure) (HCC)    Complication of anesthesia    aspiration   Essential tremor    GERD (gastroesophageal reflux disease)    otc   Hypertension    Hypothyroidism    LBBB (left bundle branch block)    conduction disorder   Migraines    Mild aortic insufficiency    NICM (nonischemic cardiomyopathy) ([place])    a. 2012: normal coronaries, EF 25-30%. b. improved to 55% 11/2016.   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   right  upper lip-mohs Dr.mitkov   SCC (squamous cell carcinoma) 09/08/2020   in situ- right lower leg-anterior ([id number])   Scoliosis     Past Surgical History:  Procedure Laterality Date   ARTERY BIOPSY  12/29/2011   Procedure: MINOR BIOPSY TEMPORAL ARTERY;  Surgeon: Edward Jolly, MD;  Location: [facility];  Service: General;  Laterality: Right;  Right temporal artery biopsy   BACK SURGERY  12/31/2009   "for flat back syndrome; fused bottom of my back"   BI-VENTRICULAR IMPLANTABLE CARDIOVERTER DEFIBRILLATOR Left 01/20/2011   Procedure: BI-VENTRICULAR IMPLANTABLE CARDIOVERTER DEFIBRILLATOR  (CRT-D);  Surgeon: Evans Lance, MD;  Location[facility] CATH LAB;  Service: Cardiovascular;  Laterality: Left;   BIV ICD GENERATOR CHANGEOUT N/A 11/24/2016   Procedure: BIV ICD GENERATOR CHANGEOUT;  Surgeon: Evans Lance, MD;  Location: [place] CV LAB;  Service: Cardiovascular;  Laterality: N/A;   BREAST LUMPECTOMY WITH RADIOACTIVE SEED LOCALIZATION Left 01/22/2020   Procedure: LEFT BREAST LUMPECTOMY WITH RADIOACTIVE SEED LOCALIZATION;  Surgeon: Donnie Mesa, MD;  Location: [place];  Service: General;  Laterality: Left;  LMA   CHOLECYSTECTOMY  ~1996   FOOT NEUROMA SURGERY  ~ 2003   right   RE-EXCISION OF BREAST LUMPECTOMY Left 03/03/2020   Procedure: RE-EXCISION MARGINS OF LEFT BREAST LUMPECTOMY;  Surgeon: Donnie Mesa, MD;  Location: [place];  Service: General;  Laterality: Left;   ROTATOR CUFF REPAIR  ~ 2009   left   SHOULDER ARTHROSCOPY W/ ROTATOR CUFF REPAIR Right 09/21/2011   SKIN CANCER EXCISION     nose, forehead, left leg   TEE WITHOUT CARDIOVERSION N/A 03/01/2017   Procedure: TRANSESOPHAGEAL ECHOCARDIOGRAM (TEE);  Surgeon: Skeet Latch, MD;  Location: [facility];  Service: Cardiovascular;  Laterality: N/A;   THYROID LOBECTOMY  ~ 2006   right   TUBAL LIGATION  ~ [place]  ~ 2009   VESICOVAGINAL FISTULA  CLOSURE W/ TAH      Current Medications: Current Outpatient Medications on File Prior to Visit  Medication Sig   acetaminophen (TYLENOL) 325 MG tablet Take 650 mg by mouth as needed.   amitriptyline (ELAVIL) 25 MG tablet Take 44m by mouth every night   anastrozole (ARIMIDEX) 1 MG tablet Take 1 tablet (1 mg total) by mouth daily.   CALCIUM PO Take 2 tablets by mouth daily.   cetirizine (ZYRTEC) 10 MG tablet Take 10 mg by mouth daily.   Cyanocobalamin 1000 MCG/ML KIT Inject 1,000 mcg every 30 (thirty) days as directed.   cyclobenzaprine (FLEXERIL) 10 MG tablet    eletriptan (RELPAX) 40 MG tablet Take 40 mg by mouth daily as needed for migraine. May repeat in 2 hours if necessary   ELIQUIS 5 MG TABS tablet Take 1 tablet (5 mg total) by mouth 2 (two) times daily.   famotidine (PEPCID) 20 MG tablet One after supper   gabapentin (NEURONTIN) 600 MG tablet Take 600 mg by mouth 2 (two) times daily.   ketoconazole (NIZORAL) 2 % cream Apply to corner of mouth nightly   levothyroxine (SYNTHROID, LEVOTHROID) 100 MCG tablet Take 100 mcg daily before breakfast by mouth.   Melatonin 5 MG CAPS Take 5 mg by mouth at bedtime.   Menthol (BIOFREEZE) 10 % AERO    nitrofurantoin (MACRODANTIN) 100 MG capsule Take 1 capsule (100 mg total) by mouth daily. Take for suppressive therapy to prevent UTI.   Omega-3 Fatty Acids (FISH OIL) 1000 MG CAPS 1 capsule   polyethylene glycol (MIRALAX / GLYCOLAX) packet Take 17 g every evening by mouth.   propranolol (INDERAL) 60 MG tablet Take 1 tablet (60 mg total) by mouth daily.   spironolactone (ALDACTONE) 25 MG tablet TAKE 1/2 TABLET BY MOUTH EVERY DAY   Varenicline Tartrate (TYRVAYA) 0.03 MG/ACT SOLN Place into the nose.   VITAMIN D PO Take 2 capsules by mouth daily.   No current facility-administered medications on file prior to visit.     Allergies:   Other, Tape, Codeine, Fentanyl, and Morphine and related   Social History   Tobacco Use   Smoking status: Never     Passive exposure: Never   Smokeless tobacco: Never  Vaping Use   Vaping Use: Never used  Substance Use Topics   Alcohol use: No   Drug use: No    Family History: family history includes Atrial fibrillation in her sister; Heart disease in her mother, sister, and sister; Heart disease (age of onset: 931 in her father; Heart failure (age of onset: 835 in her mother; Hypertension in her mother; Macular degeneration in her father; Stroke in her mother.  ROS:   Please see the history of present illness.   Additional pertinent ROS otherwise unremarkable.  EKGs/Labs/Other Studies Reviewed:    The following studies were reviewed today:  Echo 08/12/2020:   1. Abnormal septal motion EF and GLS stable since 08/14/19 . Left  ventricular ejection fraction, by estimation, is 50 to 55%. The left  ventricle has low normal function. The left ventricle has no regional wall  motion abnormalities. Left ventricular  diastolic parameters were normal. The average left ventricular global  longitudinal strain is -22.9 %. The global longitudinal strain is normal.   2. Right ventricular systolic function is normal. The right ventricular  size is normal. There is normal pulmonary artery systolic pressure.   3. The pericardial effusion is posterior to the left ventricle.   4. The mitral valve is normal in structure. No evidence of mitral valve  regurgitation. No evidence of mitral stenosis.   5. The aortic valve is tricuspid. Aortic valve regurgitation is mild. No  aortic stenosis is present.   6. Aortic dilatation noted. There is mild dilatation of the ascending  aorta, measuring 39 mm.   7. The inferior vena cava is normal in size with greater than 50%  respiratory variability, suggesting right atrial pressure of 3 mmHg.   Comparison(s): 08/14/19 EF 50-55%.   Echo 08/14/19 1. Left ventricular ejection fraction, by estimation, is 50 to 55%. The  left ventricle has low normal function. The left ventricle  has no regional  wall motion abnormalities. Left ventricular diastolic parameters are  consistent with Grade I diastolic  dysfunction (impaired relaxation). The average left ventricular global  longitudinal strain is -25.1 %.   2. Right ventricular systolic function is normal. The right ventricular  size is normal. There is normal pulmonary artery systolic pressure.   3. The mitral valve is normal in structure. No evidence of mitral valve  regurgitation. No evidence of mitral stenosis.   4. The aortic valve is normal in structure. Aortic valve regurgitation is  mild. No aortic stenosis is present.   5. Aortic root/ascending aorta has been repaired/replaced. 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   Comparison(s): 02/26/17 EF 60-65%. PA pressure 36mHg.   TEE 02/2017 - Left ventricle: Systolic function was normal. Wall motion was   normal; there were no regional wall motion abnormalities. - Aortic valve: There was mild regurgitation. - Mitral valve: There was trivial regurgitation. - Left atrium: No evidence of thrombus in the atrial cavity or   appendage. No evidence of thrombus in the atrial cavity or   appendage. - Right ventricle: The cavity size was normal. Wall thickness was   normal. Systolic function was normal. - Right atrium: No evidence of thrombus in the atrial cavity or   appendage. - Atrial septum: A patent foramen ovale is likely. There was no   right to left flow at rest but saline microcavitation study was   positive with abdominal pressure. The test was repeated to ensure   that there was no extracardiac shunt and was negative. - Tricuspid valve: There was trivial regurgitation.   TTE 02/2017 Study Conclusions   - Left ventricle: The cavity size was normal. Wall thickness was   normal. Systolic function was normal. The estimated ejection   fraction was in the range of 60% to 65%. Wall motion was normal;   there were no regional  wall motion abnormalities. Doppler   parameters are consistent with abnormal left ventricular   relaxation (grade 1 diastolic dysfunction). The E/e&' ratio is <8,   suggesting normal LV filling pressure. - Aortic valve: Sclerosis without stenosis. There was trivial   regurgitation. - Left atrium: The atrium was normal in size. - Tricuspid valve: There was trivial regurgitation. - Pulmonary arteries: PA  peak pressure: 30 mm Hg (S). - Inferior vena cava: The vessel was normal in size. The   respirophasic diameter changes were in the normal range (>= 50%),   consistent with normal central venous pressure.   Impressions:   - Compared to a prior study in 2018, the LVEF is higher at 60-65%   and there is now grade 1 DD with normal LV filling pressure.  EKG:  EKG is personally reviewed.   06/02/21: AV dual paced rhythm at 49 bpm 07/27/2020: atrial-sensed, ventricular-paced rhythm at 56 bpm 08/01/19: atrial-sensed, ventricular-paced rhythm at 73 bpm  Recent Labs: 02/04/2021: ALT 25 04/07/2021: BUN 26; Creatinine, Ser 0.98; Hemoglobin 14.0; Platelets 312.0; Potassium 4.7; Pro B Natriuretic peptide (BNP) 28.0; Sodium 139; TSH 2.48  Recent Lipid Panel    Component Value Date/Time   CHOL  02/11/2010 0555    103        ATP III CLASSIFICATION:  <200     mg/dL   Desirable  200-239  mg/dL   Borderline High  >=240    mg/dL   High          TRIG 85 02/11/2010 0555   HDL 42 02/11/2010 0555   CHOLHDL 2.5 02/11/2010 0555   VLDL 17 02/11/2010 0555   LDLCALC  02/11/2010 0555    44        Total Cholesterol/HDL:CHD Risk Coronary Heart Disease Risk Table                     Men   Women  1/2 Average Risk   3.4   3.3  Average Risk       5.0   4.4  2 X Average Risk   9.6   7.1  3 X Average Risk  23.4   11.0        Use the calculated Patient Ratio above and the CHD Risk Table to determine the patient's CHD Risk.        ATP III CLASSIFICATION (LDL):  <100     mg/dL   Optimal  100-129  mg/dL   Near  or Above                    Optimal  130-159  mg/dL   Borderline  160-189  mg/dL   High  >190     mg/dL   Very High    Physical Exam:    VS:  BP 108/70 (BP Location: Right Arm, Patient Position: Sitting, Cuff Size: Normal)   Pulse (!) 49   Ht 5' 7.5" (1.715 m)   Wt 153 lb 12.8 oz (69.8 kg)   BMI 23.73 kg/m     Wt Readings from Last 3 Encounters:  06/02/21 153 lb 12.8 oz (69.8 kg)  04/07/21 156 lb 12.8 oz (71.1 kg)  02/18/21 159 lb 3.2 oz (72.2 kg)    GEN: Well nourished, well developed in no acute distress HEENT: Normal, moist mucous membranes NECK: No JVD CARDIAC: regular rhythm, normal S1 and S2, no rubs or gallops. No murmur. VASCULAR: Radial and DP pulses 2+ bilaterally. No carotid bruits RESPIRATORY:  Clear to auscultation without rales, wheezing or rhonchi  ABDOMEN: Soft, non-tender, non-distended MUSCULOSKELETAL:  Ambulates independently SKIN: Warm and dry, no edema NEUROLOGIC:  Alert and oriented x 3. No focal neuro deficits noted. PSYCHIATRIC:  Normal affect    ASSESSMENT:    1. Paroxysmal atrial fibrillation (HCC)   2. Nonischemic cardiomyopathy ([place])   3. Biventricular cardiac pacemaker  in situ   4. Dyspnea on exertion   5. Primary hypertension   6. Long term current use of anticoagulant therapy     PLAN:    Dyspnea on exertion -improved with current regimen from pulmonology  Nonischemic cardiomyopathy, LBBB:  -EF normalized s/p CRT, follows with Dr. Lovena Le -continue spironolactone -on propranolol 60 mg daily. 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  -It was never fully determined what caused her splenic  infarction, as she had a possible PFO on TEE but also had only very brief afib seen on her device. Dr. Wynonia Lawman previously started apixaban, and she is tolerating this without issue. She prefers to be on anticoagulation vs. Risking another embolic event given the uncertain cause.  Cardiac risk counseling and prevention recommendations: -recommend heart healthy/Mediterranean diet, with whole grains, fruits, vegetable, fish, lean meats, nuts, and olive oil. Limit salt. -recommend moderate walking, 3-5 times/week for 30-50 minutes each session. Aim for at least 150 minutes.week. Goal should be pace of 3 miles/hours, or walking 1.5 miles in 30 minutes -recommend avoidance of tobacco products. Avoid excess alcohol.  Plan for follow up: 6 mos or sooner as needed  Buford Dresser, MD, PhD [place]  [facility]   Medication Adjustments/Labs and Tests Ordered: Current medicines are reviewed at length with the patient today.  Concerns regarding medicines are outlined above.  Orders Placed This Encounter  Procedures   EKG 12-Lead   No orders of the defined types were placed in this encounter.  Patient Instructions  Medication Instructions:  Your Physician recommend you continue on your current medication as directed.    *If you need a refill on your cardiac medications before your next appointment, please call your pharmacy*   Lab Work: None ordered today   Testing/Procedures: None ordered today   Follow-Up: At [facility], you and your health needs are our priority.  As part of our continuing mission to provide you with exceptional heart care, we have created designated Provider Care Teams.  These Care Teams include your primary Cardiologist (physician) and Advanced Practice Providers (APPs -  Physician Assistants and Nurse Practitioners) who all work together to provide you with the care you need, when you need it.  We recommend signing up for the patient portal called  "MyChart".  Sign up information is provided on this After Visit Summary.  MyChart is used to connect with patients for Virtual Visits (Telemedicine).  Patients are able to view lab/test results, encounter notes, upcoming appointments, etc.  Non-urgent messages can be sent to your provider as well.   To learn more about what you can do with MyChart, go to [email or web address].    Your next appointment:   6 month(s)  The format for your next appointment:   In Person  Provider:   Buford Dresser, MD{   Important Information About Sugar          Signed, Buford Dresser, MD PhD 06/02/2021  [place]

## 2021-06-02 NOTE — Patient Instructions (Signed)
Medication Instructions:  ?Your Physician recommend you continue on your current medication as directed.   ? ?*If you need a refill on your cardiac medications before your next appointment, please call your pharmacy* ? ? ?Lab Work: ?None ordered today ? ? ?Testing/Procedures: ?None ordered today ? ? ?Follow-Up: ?At [facility], you and your health needs are our priority.  As part of our continuing mission to provide you with exceptional heart care, we have created designated Provider Care Teams.  These Care Teams include your primary Cardiologist (physician) and Advanced Practice Providers (APPs -  Physician Assistants and Nurse Practitioners) who all work together to provide you with the care you need, when you need it. ? ?We recommend signing up for the patient portal called "MyChart".  Sign up information is provided on this After Visit Summary.  MyChart is used to connect with patients for Virtual Visits (Telemedicine).  Patients are able to view lab/test results, encounter notes, upcoming appointments, etc.  Non-urgent messages can be sent to your provider as well.   ?To learn more about what you can do with MyChart, go to [email or web address].   ? ?Your next appointment:   ?6 month(s) ? ?The format for your next appointment:   ?In Person ? ?Provider:   ?Bridgette Christopher, MD{ ? ? ?Important Information About Sugar ? ? ? ? ? ? ?

## 2021-06-10 NOTE — Progress Notes (Signed)
Remote pacemaker transmission.   

## 2021-06-11 ENCOUNTER — Telehealth (HOSPITAL_BASED_OUTPATIENT_CLINIC_OR_DEPARTMENT_OTHER): Payer: Self-pay | Admitting: Cardiology

## 2021-06-11 NOTE — Telephone Encounter (Signed)
Left message for patient to call back  

## 2021-06-11 NOTE — Telephone Encounter (Signed)
Pt c/o swelling: STAT is pt has developed SOB within 24 hours  If swelling, where is the swelling located? Feet and ankles  How much weight have you gained and in what time span? Not sure. Patient does not have a scale   Have you gained 3 pounds in a day or 5 pounds in a week?   Do you have a log of your daily weights (if so, list)? no  Are you currently taking a fluid pill? no  Are you currently SOB? no  Have you traveled recently?  Patient was on vacation in the [place] the past week. The patient is on her way home as she called

## 2021-06-14 NOTE — Telephone Encounter (Signed)
3rd call attempt, no answer, LM, removing from triage basket.

## 2021-06-14 NOTE — Telephone Encounter (Signed)
Left message for patient to call back  

## 2021-06-16 DIAGNOSIS — Z136 Encounter for screening for cardiovascular disorders: Secondary | ICD-10-CM | POA: Diagnosis not present

## 2021-06-16 DIAGNOSIS — E538 Deficiency of other specified B group vitamins: Secondary | ICD-10-CM | POA: Diagnosis not present

## 2021-06-16 DIAGNOSIS — Z23 Encounter for immunization: Secondary | ICD-10-CM | POA: Diagnosis not present

## 2021-06-16 DIAGNOSIS — Z0000 Encounter for general adult medical examination without abnormal findings: Secondary | ICD-10-CM | POA: Diagnosis not present

## 2021-06-16 DIAGNOSIS — I10 Essential (primary) hypertension: Secondary | ICD-10-CM | POA: Diagnosis not present

## 2021-06-16 DIAGNOSIS — E890 Postprocedural hypothyroidism: Secondary | ICD-10-CM | POA: Diagnosis not present

## 2021-06-18 ENCOUNTER — Ambulatory Visit: Payer: Medicare PPO | Admitting: Podiatry

## 2021-06-18 ENCOUNTER — Ambulatory Visit: Payer: Medicare PPO

## 2021-06-18 DIAGNOSIS — M24574 Contracture, right foot: Secondary | ICD-10-CM | POA: Diagnosis not present

## 2021-06-18 DIAGNOSIS — M216X1 Other acquired deformities of right foot: Secondary | ICD-10-CM

## 2021-06-18 DIAGNOSIS — Z01818 Encounter for other preprocedural examination: Secondary | ICD-10-CM | POA: Diagnosis not present

## 2021-06-18 DIAGNOSIS — M2041 Other hammer toe(s) (acquired), right foot: Secondary | ICD-10-CM | POA: Diagnosis not present

## 2021-06-18 DIAGNOSIS — M205X2 Other deformities of toe(s) (acquired), left foot: Secondary | ICD-10-CM

## 2021-06-21 ENCOUNTER — Telehealth: Payer: Self-pay | Admitting: Cardiology

## 2021-06-21 NOTE — Telephone Encounter (Signed)
   Pre-operative Risk Assessment    Patient Name: [name]  DOB: 24-Nov-1955 MRN: [id number]      Request for Surgical Clearance    Procedure:   Hammer toe with Capsulotomy   Right Foot 2,3, and 4th Toe  Date of Surgery:  Clearance 08/02/21                                 Surgeon:  Dr. Boneta Lucks Surgeon's Group or Practice Name:  [facility] Phone number:  [phone] Fax number:  [phone]   Type of Clearance Requested:   - Medical  - Pharmacy:  Hold        Type of Anesthesia:   Choice   Additional requests/questions:  Please advise surgeon/provider what medications should be held.  Signed, Belisicia T Harris   06/21/2021, 3:16 PM

## 2021-06-23 NOTE — Progress Notes (Signed)
Subjective:  Patient ID: [name], female    DOB: 05/09/1955,  MRN: [id number]  Chief Complaint  Patient presents with   Foot Pain    66 y.o. female presents with the above complaint.  Patient presents with complaint of right second third and fourth hammertoe contractures.  Patient states is painful to touch has progressive gotten worse.  She has tried conservative care including padding shoe gear modification protecting offloading none of which has helped.  She would like to discuss surgical options at this time.  She states that her right side is the worst side and would like to focus on that.  Pain scale is 7 out of 10 hurts with ambulation   Review of Systems: Negative except as noted in the HPI. Denies N/V/F/Ch.  Past Medical History:  Diagnosis Date   AF (paroxysmal atrial fibrillation) (HCC)    Arthritis    BCC (basal cell carcinoma) 03/11/2003   left distal lower leg ankle-tx dr Tonia Brooms   Biventricular cardiac pacemaker in situ    a. prior CRT-D in 2013 with downgrade to CRT-Pacemaker only in 11/2016.   Breast cancer ([place]) 41/93/7902   Chronic systolic CHF (congestive heart failure) (HCC)    Complication of anesthesia    aspiration   Essential tremor    GERD (gastroesophageal reflux disease)    otc   Hypertension    Hypothyroidism    LBBB (left bundle branch block)    conduction disorder   Migraines    Mild aortic insufficiency    NICM (nonischemic cardiomyopathy) ([place])    a. 2012: normal coronaries, EF 25-30%. b. improved to 55% 11/2016.   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   in situ- right lower leg-anterior (CX35FU)   Scoliosis     Current Outpatient Medications:    acetaminophen (TYLENOL) 325 MG tablet, Take 650 mg by mouth as needed.,  Disp: , Rfl:    amitriptyline (ELAVIL) 25 MG tablet, Take 54m by mouth every night, Disp: , Rfl: 5   anastrozole (ARIMIDEX) 1 MG tablet, Take 1 tablet (1 mg total) by mouth daily., Disp: 90 tablet, Rfl: 3   CALCIUM PO, Take 2 tablets by mouth daily., Disp: , Rfl:    cetirizine (ZYRTEC) 10 MG tablet, Take 10 mg by mouth daily., Disp: , Rfl:    Cyanocobalamin 1000 MCG/ML KIT, Inject 1,000 mcg every 30 (thirty) days as directed., Disp: , Rfl:    cyclobenzaprine (FLEXERIL) 10 MG tablet, , Disp: , Rfl:    eletriptan (RELPAX) 40 MG tablet, Take 40 mg by mouth daily as needed for migraine. May repeat in 2 hours if necessary, Disp: , Rfl:    ELIQUIS 5 MG TABS tablet, Take 1 tablet (5 mg total) by mouth 2 (two) times daily., Disp: 180 tablet, Rfl: 3   famotidine (PEPCID) 20 MG tablet, One after supper, Disp: 30 tablet, Rfl: 11   gabapentin (NEURONTIN) 600 MG tablet, Take 600 mg by mouth 2 (two) times daily., Disp: , Rfl:    ketoconazole (NIZORAL) 2 % cream, Apply to corner of mouth nightly, Disp: 15 g, Rfl: 0   levothyroxine (SYNTHROID, LEVOTHROID) 100 MCG tablet, Take 100 mcg daily before breakfast by mouth., Disp: , Rfl:    Melatonin 5 MG CAPS, Take 5  mg by mouth at bedtime., Disp: , Rfl:    Menthol (BIOFREEZE) 10 % AERO, , Disp: , Rfl:    nitrofurantoin (MACRODANTIN) 100 MG capsule, Take 1 capsule (100 mg total) by mouth daily. Take for suppressive therapy to prevent UTI., Disp: 90 capsule, Rfl: 1   Omega-3 Fatty Acids (FISH OIL) 1000 MG CAPS, 1 capsule, Disp: , Rfl:    polyethylene glycol (MIRALAX / GLYCOLAX) packet, Take 17 g every evening by mouth., Disp: , Rfl:    propranolol (INDERAL) 60 MG tablet, Take 1 tablet (60 mg total) by mouth daily., Disp: 90 tablet, Rfl: 3   spironolactone (ALDACTONE) 25 MG tablet, TAKE 1/2 TABLET BY MOUTH EVERY DAY, Disp: 45 tablet, Rfl: 1   Varenicline Tartrate (TYRVAYA) 0.03 MG/ACT SOLN, Place into the nose., Disp: , Rfl:    VITAMIN D PO, Take 2 capsules by mouth  daily., Disp: , Rfl:   Social History   Tobacco Use  Smoking Status Never   Passive exposure: Never  Smokeless Tobacco Never    Allergies  Allergen Reactions   Other Nausea And Vomiting and Other (See Comments)    Most narcotic pain meds cause N/V and CHEST PAIN; needs an antiemetic to tolerate  Also, patient was diagnosed with Raynaud's disease and was told to NOT place IV(s) in either hand because of poor circulation   Tape Other (See Comments)    PATIENT'S SKIN IS VERY THIN; IT TEARS, BRUISES, AND BLEEDS VERY EASILY (PLEASE USE COBAN WRAP, IF POSSIBLE!!)   Codeine Other (See Comments)    Chest pain   Fentanyl Other (See Comments)    Bad dreams   Morphine And Related Nausea And Vomiting   Objective:  There were no vitals filed for this visit. There is no height or weight on file to calculate BMI. Constitutional Well developed. Well nourished.  Vascular Dorsalis pedis pulses palpable bilaterally. Posterior tibial pulses palpable bilaterally. Capillary refill normal to all digits.  No cyanosis or clubbing noted. Pedal hair growth normal.  Neurologic Normal speech. Oriented to person, place, and time. Epicritic sensation to light touch grossly present bilaterally.  Dermatologic Nails well groomed and normal in appearance. No open wounds. No skin lesions.  Orthopedic: Right second third and fourth digit semiflexible hammertoe contracture noted with contracture of the metatarsophalangeal joint of second third and fourth with plantarflexed right second and third.  Pain on palpation to the sites.   Radiographs: 3 views of skeletally mature adult right foot:Hammertoe contracture of second third and fourth digit noted.  Plantarflexed second and third metatarsal.  Metatarsal parabola shows some shortening of the second as well as a third.  Fourth and fifth metatarsal head are within normal limits some hallux limitus noted.  Midfoot arthritis noted.  Ankle arthritis noted.  No other  bony abnormalities noted Assessment:   1. Hammertoe of right foot   2. Joint contracture of foot, right   3. Plantar flexed metatarsal, right   4. Encounter for preoperative examination for general surgical procedure    Plan:  Patient was evaluated and treated and all questions answered.  Right second third and fourth digit hammertoe contracture with underlying joint contracture as well as plantarflexed right second and third metatarsal -All questions and concerns were discussed with the patient in extensive detail -I explained to the patient that she will benefit from arthroplasty of right second third and fourth digit with capsulotomy of that each respective metatarsophalangeal joint with Weil osteotomy of the second and third with fixation.  I discussed with the patient in extensive detail discussed my preoperative intraoperative postoperative findings and nature she states understanding would like to proceed with surgery. -Informed surgical risk consent was reviewed and read aloud to the patient.  I reviewed the films.  I have discussed my findings with the patient in great detail.  I have discussed all risks including but not limited to infection, stiffness, scarring, limp, disability, deformity, damage to blood vessels and nerves, numbness, poor healing, need for braces, arthritis, chronic pain, amputation, death.  All benefits and realistic expectations discussed in great detail.  I have made no promises as to the outcome.  I have provided realistic expectations.  I have offered the patient a 2nd opinion, which they have declined and assured me they preferred to proceed despite the risks   No follow-ups on file.

## 2021-06-24 DIAGNOSIS — H5213 Myopia, bilateral: Secondary | ICD-10-CM | POA: Diagnosis not present

## 2021-06-24 DIAGNOSIS — H04123 Dry eye syndrome of bilateral lacrimal glands: Secondary | ICD-10-CM | POA: Diagnosis not present

## 2021-06-24 NOTE — Telephone Encounter (Signed)
Primary Cardiologist:Bridgette Harrell Gave, MD  Chart reviewed as part of pre-operative protocol coverage. Because of Ane F Chlebowski's past medical history and time since last visit, he/she will require a virtual visit/telephone call in order to better assess preoperative cardiovascular risk. Due to a call to our office with concerns regarding leg edema since last ov with Dr. Harrell Gave on 5/24.   Pre-op covering staff: - Please contact patient, obtain consent, and schedule appointment   Request regarding holding anticoagulant has been addressed by Salli Real, NP-C    06/24/2021, 11:19 AM [place] [address], [address] Office [phone] Fax [phone]

## 2021-06-24 NOTE — Telephone Encounter (Signed)
Patient with diagnosis of atrial fibrillation on Eliquis for anticoagulation.    Procedure: hammer toe with capsulotomy, right foot toes 2,3 and 4 Date of procedure: 08/02/21   CHA2DS2-VASc Score = 4   This indicates a 4.8% annual risk of stroke. The patient's score is based upon: CHF History: 1 HTN History: 1 Diabetes History: 0 Stroke History: 0 Vascular Disease History: 0 Age Score: 1 Gender Score: 1    CrCl 55 Platelet count 312  Per office protocol, patient can hold Eliquis for 2 days prior to procedure.   Patient will not need bridging with Lovenox (enoxaparin) around procedure.

## 2021-06-25 NOTE — Telephone Encounter (Signed)
Left message for the pt to call the office to schedule a tele pre op appt 

## 2021-06-28 ENCOUNTER — Telehealth: Payer: Self-pay | Admitting: *Deleted

## 2021-06-28 DIAGNOSIS — F411 Generalized anxiety disorder: Secondary | ICD-10-CM | POA: Diagnosis not present

## 2021-06-28 NOTE — Telephone Encounter (Signed)
06/28/21  6:14 PM Note S/w the pt about tele pre op appt. Pt agreeable to plan of care, she tells me her surgery is post poned until October 2023. We have scheduled a tele pre op appt 09/27/21. Med rec and consent are done. Pt tells me she has some swelling in her ankles. See separate phone note about swelling.     Med rec and consent are done.     Patient Consent for Virtual Visit        [name] has provided verbal consent on 06/28/2021 for a virtual visit (video or telephone).   CONSENT FOR VIRTUAL VISIT FOR:  [name]  By participating in this virtual visit I agree to the following:  I hereby voluntarily request, consent and authorize [facility] and its employed or contracted physicians, physician assistants, nurse practitioners or other licensed health care professionals (the Practitioner), to provide me with telemedicine health care services (the "Services") as deemed necessary by the treating Practitioner. I acknowledge and consent to receive the Services by the Practitioner via telemedicine. I understand that the telemedicine visit will involve communicating with the Practitioner through live audiovisual communication technology and the disclosure of certain medical information by electronic transmission. I acknowledge that I have been given the opportunity to request an in-person assessment or other available alternative prior to the telemedicine visit and am voluntarily participating in the telemedicine visit.  I understand that I have the right to withhold or withdraw my consent to the use of telemedicine in the course of my care at any time, without affecting my right to future care or treatment, and that the Practitioner or I may terminate the telemedicine visit at any time. I understand that I have the right to inspect all information obtained and/or recorded in the course of the telemedicine visit and may receive copies of available information for a  reasonable fee.  I understand that some of the potential risks of receiving the Services via telemedicine include:  Delay or interruption in medical evaluation due to technological equipment failure or disruption; Information transmitted may not be sufficient (e.g. poor resolution of images) to allow for appropriate medical decision making by the Practitioner; and/or  In rare instances, security protocols could fail, causing a breach of personal health information.  Furthermore, I acknowledge that it is my responsibility to provide information about my medical history, conditions and care that is complete and accurate to the best of my ability. I acknowledge that Practitioner's advice, recommendations, and/or decision may be based on factors not within their control, such as incomplete or inaccurate data provided by me or distortions of diagnostic images or specimens that may result from electronic transmissions. I understand that the practice of medicine is not an exact science and that Practitioner makes no warranties or guarantees regarding treatment outcomes. I acknowledge that a copy of this consent can be made available to me via my patient portal ([facility]), or I can request a printed copy by calling the office of [facility].    I understand that my insurance will be billed for this visit.   I have read or had this consent read to me. I understand the contents of this consent, which adequately explains the benefits and risks of the Services being provided via telemedicine.  I have been provided ample opportunity to ask questions regarding this consent and the Services and have had my questions answered to my satisfaction. I give my informed consent  for the services to be provided through the use of telemedicine in my medical care

## 2021-06-28 NOTE — Telephone Encounter (Signed)
S/w the pt about tele pre op appt. Pt agreeable to plan of care, she tells me her surgery is post poned until October 2023. We have scheduled a tele pre op appt 09/27/21. Med rec and consent are done. Pt tells me she has some swelling in her ankles. See separate phone note about swelling.     I assured her that I will have Dr. Buford Dresser and her nurse call her tomorrow to f/u on swelling. Pt asked to call her in the afternoon tomorrow.

## 2021-06-30 ENCOUNTER — Ambulatory Visit: Payer: Medicare PPO | Admitting: Physician Assistant

## 2021-06-30 ENCOUNTER — Telehealth (HOSPITAL_BASED_OUTPATIENT_CLINIC_OR_DEPARTMENT_OTHER): Payer: Self-pay

## 2021-06-30 NOTE — Telephone Encounter (Signed)
-  Received message stating pt requested a call regarding leg swelling -Left message to call back

## 2021-07-03 ENCOUNTER — Other Ambulatory Visit: Payer: Self-pay | Admitting: Internal Medicine

## 2021-07-09 DIAGNOSIS — R233 Spontaneous ecchymoses: Secondary | ICD-10-CM | POA: Diagnosis not present

## 2021-07-09 NOTE — Telephone Encounter (Signed)
Left message to call back  

## 2021-07-23 DIAGNOSIS — F411 Generalized anxiety disorder: Secondary | ICD-10-CM | POA: Diagnosis not present

## 2021-07-23 DIAGNOSIS — G43909 Migraine, unspecified, not intractable, without status migrainosus: Secondary | ICD-10-CM | POA: Diagnosis not present

## 2021-07-23 DIAGNOSIS — R21 Rash and other nonspecific skin eruption: Secondary | ICD-10-CM | POA: Diagnosis not present

## 2021-07-30 ENCOUNTER — Ambulatory Visit (HOSPITAL_BASED_OUTPATIENT_CLINIC_OR_DEPARTMENT_OTHER): Payer: Medicare PPO | Admitting: Family

## 2021-07-31 DIAGNOSIS — L03115 Cellulitis of right lower limb: Secondary | ICD-10-CM | POA: Diagnosis not present

## 2021-07-31 DIAGNOSIS — S81811A Laceration without foreign body, right lower leg, initial encounter: Secondary | ICD-10-CM | POA: Diagnosis not present

## 2021-08-02 NOTE — Progress Notes (Unsigned)
Cardiology Office Note:    Date:  08/03/2021   ID:  [name], DOB 1955/12/17, MRN [id number]  PCP:  Stamey, Girtha Rm, [place] Prov[facility] Cardiologist:  Buford Dresser, MD Electrophysiologist:  Cristopher Peru, MD     Referring MD: [name], NP    CC: recent significant bruising while on Lexapro and Eliquis  History of Present Illness:    [name] is a 66 y.o. female with a hx of the following:   Hypertension Nonischemic cardiomyopathy, s/p CRT-D -> P with normalization of EF Paroxysmal A-fib Aortic regurgitation Left bundle branch block Paroxysmal digital cyanosis Hypothyroidism Anxiety Mild recurrent major depression Breast cancer  She was last seen by cardiology on Jun 02, 2021 by Dr. Harrell Gave and was found to be doing well from a cardiac perspective.  She was walking at least a mile every day and had lost over 10 pounds on Slim fast.  She denied any shortness of breath, chest pain, lower extremity swelling, orthopnea, PND, syncope, palpitations, or unexpected weight gain.  She is followed by EP.   Today she presents to the office stating she is having problems with her medicine.  Says she was recently started on Lexapro for her anxiety.  However after starting Lexapro she noticed significant bruising bilaterally on her arms. Her PCP saw that Lexapro was interacting with Eliquis. Has off Lexapro and has found that her bruising has improved a little. Presents to the office today because she wants to know if there is another drug to take besides Eliquis.  Previously been followed by hematology.  Denies any falls, injuries, hemoptysis, melena, hematuria, chest pain, syncope, presyncope, orthopnea, PND, or increased swelling.  States for the past year she has had intermittent palpitations that she only notices when she lays on her left side at night and does not seem to be bothersome to her.  Only has some shortness of breath with  exertion that has been intermittent, she says this remains the same since her last office visit.  Has seen pulmonology in the past.  Since Christmas she has lost 15 pounds by doing Slim fast twice a day and 1 meal a day dieting.  She is working on increasing her exercise activity.  Denies any other acute concerns or complaints today.  Past Medical History:  Diagnosis Date   AF (paroxysmal atrial fibrillation) (HCC)    Arthritis    BCC (basal cell carcinoma) 03/11/2003   left distal lower leg ankle-tx dr Tonia Brooms   Biventricular cardiac pacemaker in situ    a. prior CRT-D in 2013 with downgrade to CRT-Pacemaker only in 11/2016.   Breast cancer ([place]) 78/93/8101   Chronic systolic CHF (congestive heart failure) (HCC)    Complication of anesthesia    aspiration   Essential tremor    GERD (gastroesophageal reflux disease)    otc   Hypertension    Hypothyroidism    LBBB (left bundle branch block)    conduction disorder   Migraines    Mild aortic insufficiency    NICM (nonischemic cardiomyopathy) ([place])    a. 2012: normal coronaries, EF 25-30%. b. improved to 55% 11/2016.   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   in situ- right lower leg-anterior ([id number])  Scoliosis     Past Surgical History:  Procedure Laterality Date   ARTERY BIOPSY  12/29/2011   Procedure: MINOR BIOPSY TEMPORAL ARTERY;  Surgeon: Edward Jolly, MD;  Location: [place];  Service: General;  Laterality: Right;  Right temporal artery biopsy   BACK SURGERY  12/31/2009   "for flat back syndrome; fused bottom of my back"   BI-VENTRICULAR IMPLANTABLE CARDIOVERTER DEFIBRILLATOR Left 01/20/2011   Procedure: BI-VENTRICULAR IMPLANTABLE CARDIOVERTER DEFIBRILLATOR  (CRT-D);  Surgeon: Evans Lance, MD;  Location: [facility] CATH LAB;  Service: Cardiovascular;  Laterality: Left;   BIV ICD GENERATOR CHANGEOUT N/A 11/24/2016   Procedure: BIV ICD GENERATOR CHANGEOUT;  Surgeon: Evans Lance, MD;  Location: [place] CV LAB;  Service: Cardiovascular;  Laterality: N/A;   BREAST LUMPECTOMY WITH RADIOACTIVE SEED LOCALIZATION Left 01/22/2020   Procedure: LEFT BREAST LUMPECTOMY WITH RADIOACTIVE SEED LOCALIZATION;  Surgeon: Donnie Mesa, MD;  Location: [place];  Service: General;  Laterality: Left;  LMA   CHOLECYSTECTOMY  ~1996   FOOT NEUROMA SURGERY  ~ 2003   right   RE-EXCISION OF BREAST LUMPECTOMY Left 03/03/2020   Procedure: RE-EXCISION MARGINS OF LEFT BREAST LUMPECTOMY;  Surgeon: Donnie Mesa, MD;  Location: [place];  Service: General;  Laterality: Left;   ROTATOR CUFF REPAIR  ~ 2009   left   SHOULDER ARTHROSCOPY W/ ROTATOR CUFF REPAIR Right 09/21/2011   SKIN CANCER EXCISION     nose, forehead, left leg   TEE WITHOUT CARDIOVERSION N/A 03/01/2017   Procedure: TRANSESOPHAGEAL ECHOCARDIOGRAM (TEE);  Surgeon: Skeet Latch, MD;  Location: [place];  Service: Cardiovascular;  Laterality: N/A;   THYROID LOBECTOMY  ~ 2006   right   TUBAL LIGATION  ~ [place]  ~ 2009   VESICOVAGINAL FISTULA CLOSURE W/ TAH      Current Medications: Current Meds  Medication Sig   acetaminophen (TYLENOL) 325 MG tablet Take 650 mg by mouth as needed.   amitriptyline (ELAVIL) 25 MG tablet Take 94m by mouth every night   anastrozole (ARIMIDEX) 1 MG tablet Take 1 tablet (1 mg total) by mouth daily.   CALCIUM PO Take 2 tablets by mouth daily.   cetirizine (ZYRTEC) 10 MG tablet Take 10 mg by mouth daily.   Cyanocobalamin 1000 MCG/ML KIT Inject 1,000 mcg every 30 (thirty) days as directed.   cyclobenzaprine (FLEXERIL) 10 MG tablet    eletriptan (RELPAX) 40 MG tablet Take 40 mg by mouth daily as needed for migraine. May repeat in 2 hours if  necessary   ELIQUIS 5 MG TABS tablet Take 1 tablet (5 mg total) by mouth 2 (two) times daily.   gabapentin (NEURONTIN) 600 MG tablet Take 600 mg by mouth 2 (two) times daily.   ketoconazole (NIZORAL) 2 % cream Apply to corner of mouth nightly   levothyroxine (SYNTHROID, LEVOTHROID) 100 MCG tablet Take 100 mcg daily before breakfast by mouth.   Melatonin 5 MG CAPS Take 5 mg by mouth at bedtime.   Menthol (BIOFREEZE) 10 % AERO    nitrofurantoin (MACRODANTIN) 100 MG capsule Take 1 capsule (100 mg total) by mouth daily. Take for suppressive therapy to prevent UTI.   Omega-3 Fatty Acids (FISH OIL) 1000 MG CAPS 1 capsule   polyethylene glycol (MIRALAX / GLYCOLAX) packet Take 17 g every evening by mouth.   propranolol (INDERAL) 60 MG tablet Take 1 tablet (60 mg total) by mouth daily.   spironolactone (ALDACTONE) 25 MG tablet TAKE  1/2 TABLET BY MOUTH EVERY DAY   Varenicline Tartrate (TYRVAYA) 0.03 MG/ACT SOLN Place into the nose.   VITAMIN D PO Take 2 capsules by mouth daily.     Allergies:   Other, Tape, Codeine, Fentanyl, and Morphine and related   Social History   Socioeconomic History   Marital status: Married    Spouse name: Ronalee Belts   Number of children: 1   Years of education: MA   Highest education level: Not on file  Occupational History   Occupation: Pharmacist, hospital    Comment: n/a  Tobacco Use   Smoking status: Never    Passive exposure: Never   Smokeless tobacco: Never  Vaping Use   Vaping Use: Never used  Substance and Sexual Activity   Alcohol use: No   Drug use: No   Sexual activity: Yes  Other Topics Concern   Not on file  Social History Narrative   Patient lives at home with family.   Caffeine Use: 1-2 cups daily   Social Determinants of Health   Financial Resource Strain: Low Risk  (01/06/2020)   Overall Financial Resource Strain (CARDIA)    Difficulty of Paying Living Expenses: Not hard at all  Food Insecurity: No Food Insecurity (01/06/2020)   Hunger Vital Sign     Worried About Running Out of Food in the Last Year: Never true    Ran Out of Food in the Last Year: Never true  Transportation Needs: No Transportation Needs (01/06/2020)   PRAPARE - Hydrologist (Medical): No    Lack of Transportation (Non-Medical): No  Physical Activity: Not on file  Stress: Not on file  Social Connections: Not on file     Family History: The patient's family history includes Atrial fibrillation in her sister; Heart disease in her mother, sister, and sister; Heart disease (age of onset: 40) in her father; Heart failure (age of onset: 88) in her mother; Hypertension in her mother; Macular degeneration in her father; Stroke in her mother.  ROS:   Review of Systems  Constitutional:  Negative for chills, diaphoresis, fever, malaise/fatigue and weight loss.  HENT:  Positive for ear pain. Negative for congestion, ear discharge, hearing loss, nosebleeds, sinus pain, sore throat and tinnitus.        Recent right ear pain.   Eyes:  Negative for blurred vision, double vision, photophobia, pain, discharge and redness.  Respiratory:  Positive for shortness of breath. Negative for cough, hemoptysis, sputum production, wheezing and stridor.        DOE, stable since last visit.   Cardiovascular:  Positive for palpitations. Negative for chest pain, orthopnea, claudication, leg swelling and PND.       See HPI.  Gastrointestinal:  Negative for abdominal pain, blood in stool, constipation, diarrhea, heartburn, nausea and vomiting.  Genitourinary:  Negative for dysuria, flank pain, frequency, hematuria and urgency.  Musculoskeletal:  Negative for back pain, falls, joint pain, myalgias and neck pain.  Skin:  Negative for itching and rash.  Neurological:  Negative for dizziness, tingling, tremors, sensory change, speech change, focal weakness, seizures, loss of consciousness, weakness and headaches.  Endo/Heme/Allergies:  Negative for environmental allergies and  polydipsia. Bruises/bleeds easily.  Psychiatric/Behavioral:  Negative for depression, hallucinations, memory loss, substance abuse and suicidal ideas. The patient is nervous/anxious. The patient does not have insomnia.    Please see the history of present illness.    All other systems reviewed and are negative.  EKGs/Labs/Other Studies Reviewed:  The following studies were reviewed today:   EKG:  EKG is not ordered today.    2D echo on August 12, 2020: 1.  Abnormal septal motion EF and GLS stable since August 14, 2019.  Left ventricular ejection fraction, by estimation, is 50 to 55%.  The left ventricle has low normal function.  The left ventricle has no regional wall motion abnormalities.  Left ventricular diastolic parameters were normal.  The average left ventricular global longitudinal strain is -22.9%.  The global longitudinal strain is normal. 2.  Right ventricular systolic function is normal.  The right ventricular size is normal.  There is normal pulmonary artery systolic pressure. 3.  The pericardial effusion is posterior to the left ventricle. 4.  The mitral valve is normal in structure.  No evidence of mitral valve regurgitation.  No evidence of mitral stenosis. 5.  The aortic valve is tricuspid. Aortic valve regurgitation is mild.  No aortic stenosis is present. 6.  Aortic dilatation noted.  There is mild dilatation of the ascending aorta, measuring 39 mm. 7.  The inferior vena cava is normal in size with greater than 50% respiratory variability, suggesting right arterial pressure of 3 mmHg.  Recommendation repeat echo in 1 year to monitor size of aorta.  Vascular ultrasound lower extremity venous bilateral on March 01, 2017: Right: There is no evidence of DVT in the lower extremity.  There is no evidence of superficial venous thrombosis.  There is no evidence of a Baker's cyst. Left: There is no evidence of DVT in the left lower extremity.  There is no evidence of  superficial venous thrombosis.  There is no evidence of  a Baker's cyst.  Recent Labs: 02/04/2021: ALT 25 04/07/2021: BUN 26; Creatinine, Ser 0.98; Hemoglobin 14.0; Platelets 312.0; Potassium 4.7; Pro B Natriuretic peptide (BNP) 28.0; Sodium 139; TSH 2.48  Recent Lipid Panel    Component Value Date/Time   CHOL  02/11/2010 0555    103        ATP III CLASSIFICATION:  <200     mg/dL   Desirable  200-239  mg/dL   Borderline High  >=240    mg/dL   High          TRIG 85 02/11/2010 0555   HDL 42 02/11/2010 0555   CHOLHDL 2.5 02/11/2010 0555   VLDL 17 02/11/2010 0555   LDLCALC  02/11/2010 0555    44        Total Cholesterol/HDL:CHD Risk Coronary Heart Disease Risk Table                     Men   Women  1/2 Average Risk   3.4   3.3  Average Risk       5.0   4.4  2 X Average Risk   9.6   7.1  3 X Average Risk  23.4   11.0        Use the calculated Patient Ratio above and the CHD Risk Table to determine the patient's CHD Risk.        ATP III CLASSIFICATION (LDL):  <100     mg/dL   Optimal  100-129  mg/dL   Near or Above                    Optimal  130-159  mg/dL   Borderline  160-189  mg/dL   High  >190     mg/dL   Very High  Risk Assessment/Calculations:    CHA2DS2-VASc Score = 4   This indicates a 4.8% annual risk of stroke. The patient's score is based upon: CHF History: 1 HTN History: 1 Diabetes History: 0 Stroke History: 0 Vascular Disease History: 0 Age Score: 1 Gender Score: 1          Physical Exam:    VS:  BP 100/80   Pulse (!) 58   Ht 5' 7.5" (1.715 m)   Wt 153 lb (69.4 kg)   BMI 23.61 kg/m     Wt Readings from Last 3 Encounters:  08/03/21 153 lb (69.4 kg)  06/02/21 153 lb 12.8 oz (69.8 kg)  04/07/21 156 lb 12.8 oz (71.1 kg)     GEN: Well nourished, well developed in no acute distress HEENT: Normal NECK: No JVD; No carotid bruits CARDIAC: RRR, no murmurs, rubs, gallops RESPIRATORY:  Clear and diminished to auscultation without rales,  wheezing or rhonchi  ABDOMEN: Soft, non-tender, non-distended MUSCULOSKELETAL:  Trace nonpitting edema along BLE; No deformity  SKIN: Thin skin, generalized scattered bruising and skin tear with steristrips along RLE. Skin tear along medial left lower leg that is healing.  NEUROLOGIC:  Alert and oriented x 3 PSYCHIATRIC:  Normal affect   ASSESSMENT:    1. Bruising   2. PAF (paroxysmal atrial fibrillation) ([place])   3. Long term current use of anticoagulant therapy   4. Essential hypertension, benign   5. Biventricular cardiac pacemaker in situ   6. Dilatation of aorta (HCC)    PLAN:    In order of problems listed above:  Bruising - acute, improving Consulted clinical Pharm.D. today and there is increased bleeding risk between Lexapro and Eliquis.  Continue Eliquis 5 mg twice daily for Paroxysmal A-fib and she is on the appropriate dosage for this.  Will obtain a CBC today.  Continue to follow-up with hematology.  I would recommend that if she starts another SSRI for anxiety in the future a CBC will be checked within 1 month.   Paroxysmal A-fib, on long term anticoagulation - chronic, stable Chronic and stable.  No recent worsening palpitations.  CHA2DS2-VASc score is 4 therefore continue Eliquis 5 mg twice daily. Obtain a CBC as stated above.  3. HTN - chronic, stable BP on exam today is 100/80 and rechecked was 102/62.  States she has had recent low blood pressure readings at her appointment visits around 66-294 systolic.  Asymptomatic with this. Discussed to monitor BP at home at least 2 hours after medications and sitting for 5-10 minutes. Told her to log blood pressures and send me her readings in 2 weeks via MyChart. If she develops worsening hypotension, would consider decreasing Propranolol dosage to 40 mg tablet daily.  4. BiV PPM - chronic, stable Most recent remote device check was stable.  Continue to follow-up with the EP.   5. Dilatation of the aorta - chronic,  stable Recent echo on August 12, 2020 revealed mild dilatation of the ascending aorta, measuring 39 mm.  Dr. Harrell Gave recommended to follow-up in 1 year.  Repeat 2D echocardiogram on August 19, 2021.   6. Anxiety - acute, stable Has not been on Lexapro due to recent bruising. PCP to manage anxiety. Offered counseling services and she declined. Continue to follow with PCP.  Recommend that if she starts another SSRI to get another CBC within 1 month after starting.  7. Disposition: F/U with Dr. Harrell Gave in November 2023 as scheduled or sooner if anything changes.   Medication Adjustments/Labs  and Tests Ordered: Current medicines are reviewed at length with the patient today.  Concerns regarding medicines are outlined above.  Orders Placed This Encounter  Procedures   CBC   No orders of the defined types were placed in this encounter.   Patient Instructions  Medication Instructions:  Your Physician recommend you continue on your current medication as directed.    *If you need a refill on your cardiac medications before your next appointment, please call your pharmacy*   Lab Work: Your physician recommends that you return for lab work today- CBC  If you have labs (blood work) drawn today and your tests are completely normal, you will receive your results only by: MyChart Message (if you have MyChart) OR A paper copy in the mail If you have any lab test that is abnormal or we need to change your treatment, we will call you to review the results.  Follow-Up: At [facility], you and your health needs are our priority.  As part of our continuing mission to provide you with exceptional heart care, we have created designated Provider Care Teams.  These Care Teams include your primary Cardiologist (physician) and Advanced Practice Providers (APPs -  Physician Assistants and Nurse Practitioners) who all work together to provide you with the care you need, when you need it.  We recommend  signing up for the patient portal called "MyChart".  Sign up information is provided on this After Visit Summary.  MyChart is used to connect with patients for Virtual Visits (Telemedicine).  Patients are able to view lab/test results, encounter notes, upcoming appointments, etc.  Non-urgent messages can be sent to your provider as well.   To learn more about what you can do with MyChart, go to [email or web address].    Your next appointment:   Follow up with Dr. Harrell Gave as scheduled   Continue to follow with hematology and primary care provider    Other Instructions Please keep monitoring blood pressure and send a mychart message in 2 weeks with blood pressure log   Important Information About Sugar         Signed, Finis Bud, NP  08/03/2021 4:31 PM    [place]

## 2021-08-03 ENCOUNTER — Ambulatory Visit (HOSPITAL_BASED_OUTPATIENT_CLINIC_OR_DEPARTMENT_OTHER): Payer: Medicare PPO | Admitting: Nurse Practitioner

## 2021-08-03 ENCOUNTER — Encounter (HOSPITAL_BASED_OUTPATIENT_CLINIC_OR_DEPARTMENT_OTHER): Payer: Self-pay | Admitting: Nurse Practitioner

## 2021-08-03 VITALS — BP 100/80 | HR 58 | Ht 67.5 in | Wt 153.0 lb

## 2021-08-03 DIAGNOSIS — F419 Anxiety disorder, unspecified: Secondary | ICD-10-CM

## 2021-08-03 DIAGNOSIS — I480 Paroxysmal atrial fibrillation: Secondary | ICD-10-CM | POA: Diagnosis not present

## 2021-08-03 DIAGNOSIS — I10 Essential (primary) hypertension: Secondary | ICD-10-CM

## 2021-08-03 DIAGNOSIS — I77819 Aortic ectasia, unspecified site: Secondary | ICD-10-CM | POA: Diagnosis not present

## 2021-08-03 DIAGNOSIS — T148XXA Other injury of unspecified body region, initial encounter: Secondary | ICD-10-CM

## 2021-08-03 DIAGNOSIS — Z7901 Long term (current) use of anticoagulants: Secondary | ICD-10-CM | POA: Diagnosis not present

## 2021-08-03 DIAGNOSIS — Z950 Presence of cardiac pacemaker: Secondary | ICD-10-CM | POA: Diagnosis not present

## 2021-08-03 DIAGNOSIS — I428 Other cardiomyopathies: Secondary | ICD-10-CM

## 2021-08-03 NOTE — Patient Instructions (Signed)
Medication Instructions:  Your Physician recommend you continue on your current medication as directed.    *If you need a refill on your cardiac medications before your next appointment, please call your pharmacy*   Lab Work: Your physician recommends that you return for lab work today- CBC  If you have labs (blood work) drawn today and your tests are completely normal, you will receive your results only by: MyChart Message (if you have MyChart) OR A paper copy in the mail If you have any lab test that is abnormal or we need to change your treatment, we will call you to review the results.  Follow-Up: At [facility], you and your health needs are our priority.  As part of our continuing mission to provide you with exceptional heart care, we have created designated Provider Care Teams.  These Care Teams include your primary Cardiologist (physician) and Advanced Practice Providers (APPs -  Physician Assistants and Nurse Practitioners) who all work together to provide you with the care you need, when you need it.  We recommend signing up for the patient portal called "MyChart".  Sign up information is provided on this After Visit Summary.  MyChart is used to connect with patients for Virtual Visits (Telemedicine).  Patients are able to view lab/test results, encounter notes, upcoming appointments, etc.  Non-urgent messages can be sent to your provider as well.   To learn more about what you can do with MyChart, go to [email or web address].    Your next appointment:   Follow up with Dr. Harrell Gave as scheduled   Continue to follow with hematology and primary care provider    Other Instructions Please keep monitoring blood pressure and send a mychart message in 2 weeks with blood pressure log   Important Information About Sugar

## 2021-08-05 DIAGNOSIS — R251 Tremor, unspecified: Secondary | ICD-10-CM | POA: Diagnosis not present

## 2021-08-05 DIAGNOSIS — M542 Cervicalgia: Secondary | ICD-10-CM | POA: Diagnosis not present

## 2021-08-05 DIAGNOSIS — G43909 Migraine, unspecified, not intractable, without status migrainosus: Secondary | ICD-10-CM | POA: Diagnosis not present

## 2021-08-05 DIAGNOSIS — F419 Anxiety disorder, unspecified: Secondary | ICD-10-CM | POA: Diagnosis not present

## 2021-08-05 DIAGNOSIS — F5101 Primary insomnia: Secondary | ICD-10-CM | POA: Diagnosis not present

## 2021-08-05 DIAGNOSIS — M545 Low back pain, unspecified: Secondary | ICD-10-CM | POA: Diagnosis not present

## 2021-08-11 ENCOUNTER — Encounter: Payer: Medicare PPO | Admitting: Podiatry

## 2021-08-17 ENCOUNTER — Encounter (HOSPITAL_BASED_OUTPATIENT_CLINIC_OR_DEPARTMENT_OTHER): Payer: Self-pay

## 2021-08-19 ENCOUNTER — Ambulatory Visit (HOSPITAL_COMMUNITY): Payer: Medicare PPO | Attending: Internal Medicine

## 2021-08-19 DIAGNOSIS — I428 Other cardiomyopathies: Secondary | ICD-10-CM | POA: Diagnosis not present

## 2021-08-19 LAB — ECHOCARDIOGRAM COMPLETE
Area-P 1/2: 3.03 cm2
P 1/2 time: 698 msec
S' Lateral: 2.7 cm

## 2021-08-20 DIAGNOSIS — F418 Other specified anxiety disorders: Secondary | ICD-10-CM | POA: Diagnosis not present

## 2021-08-20 DIAGNOSIS — G43909 Migraine, unspecified, not intractable, without status migrainosus: Secondary | ICD-10-CM | POA: Diagnosis not present

## 2021-08-20 DIAGNOSIS — E538 Deficiency of other specified B group vitamins: Secondary | ICD-10-CM | POA: Diagnosis not present

## 2021-08-23 ENCOUNTER — Ambulatory Visit (HOSPITAL_BASED_OUTPATIENT_CLINIC_OR_DEPARTMENT_OTHER): Payer: Medicare PPO | Admitting: Cardiology

## 2021-08-23 ENCOUNTER — Encounter (HOSPITAL_BASED_OUTPATIENT_CLINIC_OR_DEPARTMENT_OTHER): Payer: Self-pay | Admitting: Cardiology

## 2021-08-23 VITALS — BP 100/70 | HR 54 | Ht 67.5 in | Wt 156.0 lb

## 2021-08-23 DIAGNOSIS — Z950 Presence of cardiac pacemaker: Secondary | ICD-10-CM

## 2021-08-23 DIAGNOSIS — Z7901 Long term (current) use of anticoagulants: Secondary | ICD-10-CM | POA: Diagnosis not present

## 2021-08-23 DIAGNOSIS — I10 Essential (primary) hypertension: Secondary | ICD-10-CM | POA: Diagnosis not present

## 2021-08-23 DIAGNOSIS — I480 Paroxysmal atrial fibrillation: Secondary | ICD-10-CM | POA: Diagnosis not present

## 2021-08-23 DIAGNOSIS — Z01810 Encounter for preprocedural cardiovascular examination: Secondary | ICD-10-CM

## 2021-08-23 DIAGNOSIS — I872 Venous insufficiency (chronic) (peripheral): Secondary | ICD-10-CM | POA: Diagnosis not present

## 2021-08-23 NOTE — Patient Instructions (Signed)

## 2021-08-23 NOTE — Progress Notes (Signed)
Cardiology Office Note:    Date:  08/23/2021   ID:  [name], DOB Sep 24, 1955, MRN [id number]  PCP:  Sheran Spine, FNP  Cardiologist:  Buford Dresser, MD  Referring MD: [name], NP   CC: follow up  History of Present Illness:    [name] is a 66 y.o. female with a hx of LBBB, NICM s/p CRT-D-->P with normalization of EF, paroxysmal atrial fibrillation.   At her last appointment, she was overall doing well aside from suffering chronic constipation, used miralax nightly. Stools were hard even with this. Discussed bowel regimens. Felt afib occasionally at night, can last 5-10 mins. Tolerating apixaban. Had been on slimfast, lost >10 lbs. BMI 23 at that visit. Doing this to support her husband's weight loss.   She followed up with Finis Bud, NP on 08/03/2021, recently had new significant bruising on her bilateral arms which she felt was due to being started on Lexapro for her anxiety. Her PCP noted that Lexapro was interacting with Eliquis. Her bruising improved somewhat after discontinuing Lexapro. She confirmed recent low blood pressure readings at her appointment visits around 38-756 systolic, but she was asymptomatic.  Today: She notes that her surgery for hammer toe with capsulotomy, (right foot toes 2, 3, and 4), has been moved up to 09/06/21 and she requests preoperative clearance. She denies any chest pain/tightness, shortness of breath, or syncope. The most strenuous activity in the past month has been walking her dog, who likes to pull her along. Lately she has noticed more difficulty with sitting down and standing up from chairs. Once out of the chair she ambulates without difficulty.   Overall she has been feeling better since her medication changes. However, she is also concerned about her ankles, which appear swollen and discolored. On her right shin she has a laceration wound caused by her dog.   We reviewed the results of her recent  echocardiogram in detail.  She denies any palpitations, chest pain, or shortness of breath. No lightheadedness, headaches, syncope, orthopnea, or PND.   Past Medical History:  Diagnosis Date   AF (paroxysmal atrial fibrillation) (HCC)    Arthritis    BCC (basal cell carcinoma) 03/11/2003   left distal lower leg ankle-tx dr Tonia Brooms   Biventricular cardiac pacemaker in situ    a. prior CRT-D in 2013 with downgrade to CRT-Pacemaker only in 11/2016.   Breast cancer ([place]) 43/32/9518   Chronic systolic CHF (congestive heart failure) (HCC)    Complication of anesthesia    aspiration   Essential tremor    GERD (gastroesophageal reflux disease)    otc   Hypertension    Hypothyroidism    LBBB (left bundle branch block)    conduction disorder   Migraines    Mild aortic insufficiency    NICM (nonischemic cardiomyopathy) ([place])    a. 2012: normal coronaries, EF 25-30%. b. improved to 55% 11/2016.   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   in situ- right lower leg-anterior ([id number])   Scoliosis     Past Surgical History:  Procedure Laterality Date   ARTERY BIOPSY  12/29/2011   Procedure: MINOR BIOPSY TEMPORAL ARTERY;  Surgeon: Edward Jolly, MD;  Location: [place];  Service: General;  Laterality:  Right;  Right temporal artery biopsy   BACK SURGERY  12/31/2009   "for flat back syndrome; fused bottom of my back"   BI-VENTRICULAR IMPLANTABLE CARDIOVERTER DEFIBRILLATOR Left 01/20/2011   Procedure: BI-VENTRICULAR IMPLANTABLE CARDIOVERTER DEFIBRILLATOR  (CRT-D);  Surgeon: Evans Lance, MD;  Location: [facility] C[facility] LAB;  Service: Cardiovascular;  Laterality: Left;   BIV ICD GENERATOR CHANGEOUT N/A 11/24/2016   Procedure: BIV ICD GENERATOR CHANGEOUT;  Surgeon:  Evans Lance, MD;  Location: [place] CV LAB;  Service: Cardiovascular;  Laterality: N/A;   BREAST LUMPECTOMY WITH RADIOACTIVE SEED LOCALIZATION Left 01/22/2020   Procedure: LEFT BREAST LUMPECTOMY WITH RADIOACTIVE SEED LOCALIZATION;  Surgeon: Donnie Mesa, MD;  Location: [place];  Service: General;  Laterality: Left;  LMA   CHOLECYSTECTOMY  ~1996   FOOT NEUROMA SURGERY  ~ 2003   right   RE-EXCISION OF BREAST LUMPECTOMY Left 03/03/2020   Procedure: RE-EXCISION MARGINS OF LEFT BREAST LUMPECTOMY;  Surgeon: Donnie Mesa, MD;  Location: [place];  Service: General;  Laterality: Left;   ROTATOR CUFF REPAIR  ~ 2009   left   SHOULDER ARTHROSCOPY W/ ROTATOR CUFF REPAIR Right 09/21/2011   SKIN CANCER EXCISION     nose, forehead, left leg   TEE WITHOUT CARDIOVERSION N/A 03/01/2017   Procedure: TRANSESOPHAGEAL ECHOCARDIOGRAM (TEE);  Surgeon: Skeet Latch, MD;  Location: [place];  Service: Cardiovascular;  Laterality: N/A;   THYROID LOBECTOMY  ~ 2006   right   TUBAL LIGATION  ~ [place]  ~ 2009   VESICOVAGINAL FISTULA CLOSURE W/ TAH      Current Medications: Current Outpatient Medications on File Prior to Visit  Medication Sig   acetaminophen (TYLENOL) 325 MG tablet Take 650 mg by mouth as needed.   ALPRAZolam (XANAX) 0.5 MG tablet Take 0.5 mg by mouth 3 (three) times daily as needed for anxiety.   amitriptyline (ELAVIL) 25 MG tablet Take 71m by mouth every night   anastrozole (ARIMIDEX) 1 MG tablet Take 1 tablet (1 mg total) by mouth daily.   CALCIUM PO Take 2 tablets by mouth daily.   cetirizine (ZYRTEC) 10 MG tablet Take 10 mg by mouth daily.   Cyanocobalamin 1000 MCG/ML KIT Inject 1,000 mcg every 30 (thirty) days as directed.   cyclobenzaprine (FLEXERIL) 10 MG tablet    eletriptan (RELPAX) 40 MG tablet Take 40 mg by mouth daily as needed for migraine. May repeat in 2 hours if necessary   ELIQUIS 5 MG TABS tablet Take  1 tablet (5 mg total) by mouth 2 (two) times daily.   gabapentin (NEURONTIN) 600 MG tablet Take 600 mg by mouth 2 (two) times daily.   ketoconazole (NIZORAL) 2 % cream Apply to corner of mouth nightly   levothyroxine (SYNTHROID, LEVOTHROID) 100 MCG tablet Take 100 mcg daily before breakfast by mouth.   Melatonin 5 MG CAPS Take 5 mg by mouth at bedtime.   Menthol (BIOFREEZE) 10 % AERO    nitrofurantoin (MACRODANTIN) 100 MG capsule Take 1 capsule (100 mg total) by mouth daily. Take for suppressive therapy to prevent UTI.   Omega-3 Fatty Acids (FISH OIL) 1000 MG CAPS 1 capsule   polyethylene glycol (MIRALAX / GLYCOLAX) packet Take 17 g every evening by mouth.   propranolol (INDERAL) 60 MG tablet Take 1 tablet (60 mg total) by mouth daily.   spironolactone (ALDACTONE) 25 MG tablet TAKE 1/2 TABLET BY MOUTH EVERY DAY   Varenicline Tartrate (TYRVAYA) 0.03 MG/ACT SOLN Place into  the nose.   VITAMIN D PO Take 2 capsules by mouth daily.   No current facility-administered medications on file prior to visit.     Allergies:   Other, Tape, Codeine, Fentanyl, Lexapro [escitalopram], and Morphine and related   Social History   Tobacco Use   Smoking status: Never    Passive exposure: Never   Smokeless tobacco: Never  Vaping Use   Vaping Use: Never used  Substance Use Topics   Alcohol use: No   Drug use: No    Family History: family history includes Atrial fibrillation in her sister; Heart disease in her mother, sister, and sister; Heart disease (age of onset: 7) in her father; Heart failure (age of onset: 37) in her mother; Hypertension in her mother; Macular degeneration in her father; Stroke in her mother.  ROS:   Please see the history of present illness.   (+) Bilateral LE edema (+) Skin discoloration of bilateral LE (+) Laceration wound of right shin Additional pertinent ROS otherwise unremarkable.  EKGs/Labs/Other Studies Reviewed:    The following studies were reviewed  today:  Echo  08/19/2021: 1. Left ventricular ejection fraction, by estimation, is 60 to 65%. The  left ventricle has normal function. The left ventricle has no regional  wall motion abnormalities. Left ventricular diastolic parameters are  consistent with Grade I diastolic  dysfunction (impaired relaxation). The average left ventricular global  longitudinal strain is -23.2 %. The global longitudinal strain is normal.   2. Right ventricular systolic function is normal. The right ventricular  size is normal. There is normal pulmonary artery systolic pressure. The  estimated right ventricular systolic pressure is 38.1 mmHg.   3. A small pericardial effusion is present. The pericardial effusion is  posterior to the left ventricle.   4. The mitral valve is grossly normal. Trivial mitral valve  regurgitation.   5. The aortic valve is tricuspid. Aortic valve regurgitation is mild.  Aortic valve sclerosis/calcification is present, without any evidence of  aortic stenosis.   6. Aortic dilatation noted. There is borderline dilatation of the  ascending aorta, measuring 38 mm.   7. The inferior vena cava is normal in size with greater than 50%  respiratory variability, suggesting right atrial pressure of 3 mmHg.   Comparison(s): 08/12/20 EF 50-55%. GLS -22.9%.   Echo 08/12/2020:   1. Abnormal septal motion EF and GLS stable since 08/14/19 . Left  ventricular ejection fraction, by estimation, is 50 to 55%. The left  ventricle has low normal function. The left ventricle has no regional wall  motion abnormalities. Left ventricular  diastolic parameters were normal. The average left ventricular global  longitudinal strain is -22.9 %. The global longitudinal strain is normal.   2. Right ventricular systolic function is normal. The right ventricular  size is normal. There is normal pulmonary artery systolic pressure.   3. The pericardial effusion is posterior to the left ventricle.   4. The mitral valve  is normal in structure. No evidence of mitral valve  regurgitation. No evidence of mitral stenosis.   5. The aortic valve is tricuspid. Aortic valve regurgitation is mild. No  aortic stenosis is present.   6. Aortic dilatation noted. There is mild dilatation of the ascending  aorta, measuring 39 mm.   7. The inferior vena cava is normal in size with greater than 50%  respiratory variability, suggesting right atrial pressure of 3 mmHg.   Comparison(s): 08/14/19 EF 50-55%.   Echo 08/14/19 1. Left ventricular ejection fraction,  by estimation, is 50 to 55%. The  left ventricle has low normal function. The left ventricle has no regional  wall motion abnormalities. Left ventricular diastolic parameters are  consistent with Grade I diastolic  dysfunction (impaired relaxation). The average left ventricular global  longitudinal strain is -25.1 %.   2. Right ventricular systolic function is normal. The right ventricular  size is normal. There is normal pulmonary artery systolic pressure.   3. The mitral valve is normal in structure. No evidence of mitral valve  regurgitation. No evidence of mitral stenosis.   4. The aortic valve is normal in structure. Aortic valve regurgitation is  mild. No aortic stenosis is present.   5. Aortic root/ascending aorta has been repaired/replaced. 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   Comparison(s): 02/26/17 EF 60-65%. PA pressure 41mHg.   TEE 02/2017 - Left ventricle: Systolic function was normal. Wall motion was   normal; there were no regional wall motion abnormalities. - Aortic valve: There was mild regurgitation. - Mitral valve: There was trivial regurgitation. - Left atrium: No evidence of thrombus in the atrial cavity or   appendage. No evidence of thrombus in the atrial cavity or   appendage. - Right ventricle: The cavity size was normal. Wall thickness was   normal. Systolic function was normal. - Right atrium: No  evidence of thrombus in the atrial cavity or   appendage. - Atrial septum: A patent foramen ovale is likely. There was no   right to left flow at rest but saline microcavitation study was   positive with abdominal pressure. The test was repeated to ensure   that there was no extracardiac shunt and was negative. - Tricuspid valve: There was trivial regurgitation.   TTE 02/2017 Study Conclusions   - Left ventricle: The cavity size was normal. Wall thickness was   normal. Systolic function was normal. The estimated ejection   fraction was in the range of 60% to 65%. Wall motion was normal;   there were no regional wall motion abnormalities. Doppler   parameters are consistent with abnormal left ventricular   relaxation (grade 1 diastolic dysfunction). The E/e&' ratio is <8,   suggesting normal LV filling pressure. - Aortic valve: Sclerosis without stenosis. There was trivial   regurgitation. - Left atrium: The atrium was normal in size. - Tricuspid valve: There was trivial regurgitation. - Pulmonary arteries: PA peak pressure: 30 mm Hg (S). - Inferior vena cava: The vessel was normal in size. The   respirophasic diameter changes were in the normal range (>= 50%),   consistent with normal central venous pressure.   Impressions:   - Compared to a prior study in 2018, the LVEF is higher at 60-65%   and there is now grade 1 DD with normal LV filling pressure.  EKG:  EKG is personally reviewed.   08/23/2021:  EKG was not ordered. 06/02/21: AV dual paced rhythm at 49 bpm 07/27/2020: atrial-sensed, ventricular-paced rhythm at 56 bpm 08/01/19: atrial-sensed, ventricular-paced rhythm at 73 bpm  Recent Labs: 02/04/2021: ALT 25 04/07/2021: BUN 26; Creatinine, Ser 0.98; Hemoglobin 14.0; Platelets 312.0; Potassium 4.7; Pro B Natriuretic peptide (BNP) 28.0; Sodium 139; TSH 2.48   Recent Lipid Panel    Component Value Date/Time   CHOL  02/11/2010 0555    103        ATP III CLASSIFICATION:   <200     mg/dL   Desirable  200-239  mg/dL   Borderline High  >=  240    mg/dL   High          TRIG 85 02/11/2010 0555   HDL 42 02/11/2010 0555   CHOLHDL 2.5 02/11/2010 0555   VLDL 17 02/11/2010 0555   LDLCALC  02/11/2010 0555    44        Total Cholesterol/HDL:CHD Risk Coronary Heart Disease Risk Table                     Men   Women  1/2 Average Risk   3.4   3.3  Average Risk       5.0   4.4  2 X Average Risk   9.6   7.1  3 X Average Risk  23.4   11.0        Use the calculated Patient Ratio above and the CHD Risk Table to determine the patient's CHD Risk.        ATP III CLASSIFICATION (LDL):  <100     mg/dL   Optimal  100-129  mg/dL   Near or Above                    Optimal  130-159  mg/dL   Borderline  160-189  mg/dL   High  >190     mg/dL   Very High    Physical Exam:    VS:  BP 100/70 (BP Location: Right Arm, Patient Position: Sitting, Cuff Size: Normal)   Pulse (!) 54   Ht 5' 7.5" (1.715 m)   Wt 156 lb (70.8 kg)   SpO2 98%   BMI 24.07 kg/m     Wt Readings from Last 3 Encounters:  08/23/21 156 lb (70.8 kg)  08/03/21 153 lb (69.4 kg)  06/02/21 153 lb 12.8 oz (69.8 kg)    GEN: Well nourished, well developed in no acute distress HEENT: Normal, moist mucous membranes NECK: No JVD CARDIAC: regular rhythm, normal S1 and S2, no rubs or gallops. No murmur. VASCULAR: Radial and DP pulses 2+ bilaterally. No carotid bruits RESPIRATORY:  Clear to auscultation without rales, wheezing or rhonchi  ABDOMEN: Soft, non-tender, non-distended MUSCULOSKELETAL:  Ambulates independently SKIN: Warm and dry, + pitting edema localized to bilateral ankles; skin discoloration of bilateral LE consistent with venous insufficiency. Laceration wound of right shin. NEUROLOGIC:  Alert and oriented x 3. No focal neuro deficits noted. PSYCHIATRIC:  Normal affect    ASSESSMENT:    1. Preop cardiovascular exam   2. PAF (paroxysmal atrial fibrillation) ([place])   3. Long term current use of  anticoagulant therapy   4. Biventricular cardiac pacemaker in situ   5. Essential hypertension, benign   6. Chronic venous insufficiency      PLAN:    Preoperative cardiovascular evaluation According to the Revised Cardiac Risk Index (RCRI), her Perioperative Risk of Major Cardiac Event is (%): 0.9  The patient is not currently having active cardiac symptoms, and they can achieve >4 METs of activity.  According to ACC/AHA Guidelines, no further testing is needed.  Proceed with surgery at acceptable risk.  Our service is available as needed in the peri-operative period.     Per pharmacy recommendations 06/24/21, patient can hold apixaban for two days prior to procedure if required by surgical team. She does not require lovenox bridge.  Venous insufficiency -ankle edema, skin discoloration consistent with venous insufficiency  Nonischemic cardiomyopathy, LBBB:  -reviewed recent echo, normal EF -EF normalized s/p CRT, follows with Dr. Lovena Le -continue spironolactone  12.5 mg daily -on propranolol 60 mg daily. 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  -It was never fully determined what caused her splenic infarction, as she had a possible PFO on TEE but also had only very brief afib seen on her device. Dr. Wynonia Lawman previously started apixaban, and she is tolerating this without issue. She prefers to be on anticoagulation vs. Risking another embolic event given the uncertain cause. -asking about 1/2 dose apixaban, discussed there is no stroke prevention benefit to reduced dosing unless specific criteria are met (she does not meet)  Cardiac risk  counseling and prevention recommendations: -recommend heart healthy/Mediterranean diet, with whole grains, fruits, vegetable, fish, lean meats, nuts, and olive oil. Limit salt. -recommend moderate walking, 3-5 times/week for 30-50 minutes each session. Aim for at least 150 minutes.week. Goal should be pace of 3 miles/hours, or walking 1.5 miles in 30 minutes -recommend avoidance of tobacco products. Avoid excess alcohol.  Plan for follow up: 6 mos or sooner as needed  Buford Dresser, MD, PhD [place]  [facility]   Medication Adjustments/Labs and Tests Ordered: Current medicines are reviewed at length with the patient today.  Concerns regarding medicines are outlined above.   No orders of the defined types were placed in this encounter.  No orders of the defined types were placed in this encounter.  Patient Instructions  Medication Instructions:  Your Physician recommend you continue on your current medication as directed.    *If you need a refill on your cardiac medications before your next appointment, please call your pharmacy*   Lab Work: None ordered today   Testing/Procedures: None ordered today   Follow-Up: At [facility], you and your health needs are our priority.  As part of our continuing mission to provide you with exceptional heart care, we have created designated Provider Care Teams.  These Care Teams include your primary Cardiologist (physician) and Advanced Practice Providers (APPs -  Physician Assistants and Nurse Practitioners) who all work together to provide you with the care you need, when you need it.  We recommend signing up for the patient portal called "MyChart".  Sign up information is provided on this After Visit Summary.  MyChart is used to connect with patients for Virtual Visits (Telemedicine).  Patients are able to view lab/test results, encounter notes, upcoming appointments, etc.  Non-urgent messages can be sent to your provider as  well.   To learn more about what you can do with MyChart, go to [email or web address].    Your next appointment:   6 month(s)  The format for your next appointment:   In Person  Provider:   Buford Dresser, MD{          I,Mathew Stumpf,acting as a scribe for Buford Dresser, MD.,have documented all relevant documentation on the behalf of Buford Dresser, MD,as directed by  Buford Dresser, MD while in the presence of Buford Dresser, MD.  I, Buford Dresser, MD, have reviewed all documentation for this visit. The documentation on 08/23/21 for the exam, diagnosis, procedures, and orders are all accurate and complete.   Signed, Buford Dresser, MD PhD 08/23/2021  Short

## 2021-08-24 ENCOUNTER — Ambulatory Visit (INDEPENDENT_AMBULATORY_CARE_PROVIDER_SITE_OTHER): Payer: Medicare PPO

## 2021-08-24 DIAGNOSIS — I428 Other cardiomyopathies: Secondary | ICD-10-CM | POA: Diagnosis not present

## 2021-08-24 LAB — CUP PACEART REMOTE DEVICE CHECK
Battery Remaining Longevity: 64 mo
Battery Voltage: 2.96 V
Brady Statistic AP VP Percent: 1.22 %
Brady Statistic AP VS Percent: 0.04 %
Brady Statistic AS VP Percent: 97.03 %
Brady Statistic AS VS Percent: 1.7 %
Brady Statistic RA Percent Paced: 1.28 %
Brady Statistic RV Percent Paced: 1.18 %
Date Time Interrogation Session: 20230814215301
Implantable Lead Implant Date: 20130110
Implantable Lead Implant Date: 20130110
Implantable Lead Implant Date: 20130110
Implantable Lead Location: 753858
Implantable Lead Location: 753859
Implantable Lead Location: 753860
Implantable Lead Model: 4196
Implantable Lead Model: 5076
Implantable Lead Model: 6935
Implantable Pulse Generator Implant Date: 20181115
Lead Channel Impedance Value: 1140 Ohm
Lead Channel Impedance Value: 304 Ohm
Lead Channel Impedance Value: 380 Ohm
Lead Channel Impedance Value: 513 Ohm
Lead Channel Impedance Value: 646 Ohm
Lead Channel Impedance Value: 722 Ohm
Lead Channel Impedance Value: 798 Ohm
Lead Channel Impedance Value: 836 Ohm
Lead Channel Impedance Value: 855 Ohm
Lead Channel Pacing Threshold Amplitude: 0.625 V
Lead Channel Pacing Threshold Amplitude: 1.5 V
Lead Channel Pacing Threshold Amplitude: 2.5 V
Lead Channel Pacing Threshold Pulse Width: 0.4 ms
Lead Channel Pacing Threshold Pulse Width: 0.4 ms
Lead Channel Pacing Threshold Pulse Width: 0.8 ms
Lead Channel Sensing Intrinsic Amplitude: 0.75 mV
Lead Channel Sensing Intrinsic Amplitude: 0.75 mV
Lead Channel Sensing Intrinsic Amplitude: 15.5 mV
Lead Channel Sensing Intrinsic Amplitude: 15.5 mV
Lead Channel Setting Pacing Amplitude: 2 V
Lead Channel Setting Pacing Amplitude: 2.25 V
Lead Channel Setting Pacing Amplitude: 4 V
Lead Channel Setting Pacing Pulse Width: 0.8 ms
Lead Channel Setting Pacing Pulse Width: 1 ms
Lead Channel Setting Sensing Sensitivity: 0.9 mV

## 2021-08-25 ENCOUNTER — Encounter: Payer: Medicare PPO | Admitting: Podiatry

## 2021-08-26 ENCOUNTER — Telehealth: Payer: Self-pay

## 2021-08-26 NOTE — Telephone Encounter (Signed)
DOS 09/06/2021  METATARSAL OSTETOMY 2ND & 3RD RT - 28308 HAMMERTOE REPAIR 2-4 RT - 28285 CAPSULOTOMY 2-4 RT - 28270  [facility] INSURANCE  PER [employer] - NO PRECERT NEEDED FOR CPT 01720  AUTH # [id number] FOR CPT 28308, 2 UNITS AND 96940, 3 UNITS. AUTH GOOD FROM 09/06/2021 - 11/06/2021.

## 2021-09-06 ENCOUNTER — Encounter: Payer: Self-pay | Admitting: Podiatry

## 2021-09-06 ENCOUNTER — Encounter (HOSPITAL_COMMUNITY): Payer: Self-pay

## 2021-09-06 ENCOUNTER — Emergency Department (HOSPITAL_COMMUNITY)
Admission: EM | Admit: 2021-09-06 | Discharge: 2021-09-07 | Disposition: A | Discharge status: Home / Self Care | Payer: Medicare PPO | Attending: Emergency Medicine | Admitting: Emergency Medicine

## 2021-09-06 ENCOUNTER — Other Ambulatory Visit: Payer: Self-pay | Admitting: Podiatry

## 2021-09-06 ENCOUNTER — Other Ambulatory Visit: Payer: Self-pay

## 2021-09-06 ENCOUNTER — Emergency Department (HOSPITAL_COMMUNITY): Payer: Medicare PPO

## 2021-09-06 DIAGNOSIS — R079 Chest pain, unspecified: Secondary | ICD-10-CM | POA: Diagnosis not present

## 2021-09-06 DIAGNOSIS — M205X1 Other deformities of toe(s) (acquired), right foot: Secondary | ICD-10-CM | POA: Diagnosis not present

## 2021-09-06 DIAGNOSIS — S93401A Sprain of unspecified ligament of right ankle, initial encounter: Secondary | ICD-10-CM | POA: Diagnosis not present

## 2021-09-06 DIAGNOSIS — S8991XA Unspecified injury of right lower leg, initial encounter: Secondary | ICD-10-CM | POA: Diagnosis present

## 2021-09-06 DIAGNOSIS — R457 State of emotional shock and stress, unspecified: Secondary | ICD-10-CM | POA: Diagnosis not present

## 2021-09-06 DIAGNOSIS — R42 Dizziness and giddiness: Secondary | ICD-10-CM | POA: Diagnosis not present

## 2021-09-06 DIAGNOSIS — Z4800 Encounter for change or removal of nonsurgical wound dressing: Secondary | ICD-10-CM | POA: Insufficient documentation

## 2021-09-06 DIAGNOSIS — M7989 Other specified soft tissue disorders: Secondary | ICD-10-CM | POA: Diagnosis not present

## 2021-09-06 DIAGNOSIS — M21541 Acquired clubfoot, right foot: Secondary | ICD-10-CM | POA: Diagnosis not present

## 2021-09-06 DIAGNOSIS — Z7901 Long term (current) use of anticoagulants: Secondary | ICD-10-CM | POA: Diagnosis not present

## 2021-09-06 DIAGNOSIS — R58 Hemorrhage, not elsewhere classified: Secondary | ICD-10-CM | POA: Diagnosis not present

## 2021-09-06 DIAGNOSIS — W1839XA Other fall on same level, initial encounter: Secondary | ICD-10-CM | POA: Diagnosis not present

## 2021-09-06 DIAGNOSIS — S81811A Laceration without foreign body, right lower leg, initial encounter: Secondary | ICD-10-CM

## 2021-09-06 DIAGNOSIS — M2041 Other hammer toe(s) (acquired), right foot: Secondary | ICD-10-CM | POA: Diagnosis not present

## 2021-09-06 DIAGNOSIS — G8918 Other acute postprocedural pain: Secondary | ICD-10-CM | POA: Diagnosis not present

## 2021-09-06 DIAGNOSIS — M24574 Contracture, right foot: Secondary | ICD-10-CM | POA: Diagnosis not present

## 2021-09-06 DIAGNOSIS — Z5189 Encounter for other specified aftercare: Secondary | ICD-10-CM

## 2021-09-06 DIAGNOSIS — R0789 Other chest pain: Secondary | ICD-10-CM | POA: Diagnosis not present

## 2021-09-06 DIAGNOSIS — Z0389 Encounter for observation for other suspected diseases and conditions ruled out: Secondary | ICD-10-CM | POA: Diagnosis not present

## 2021-09-06 LAB — CBC WITH DIFFERENTIAL/PLATELET
Abs Immature Granulocytes: 0.08 10*3/uL — ABNORMAL HIGH (ref 0.00–0.07)
Basophils Absolute: 0.1 10*3/uL (ref 0.0–0.1)
Basophils Relative: 0 %
Eosinophils Absolute: 0.2 10*3/uL (ref 0.0–0.5)
Eosinophils Relative: 1 %
HCT: 42.4 % (ref 36.0–46.0)
Hemoglobin: 13.9 g/dL (ref 12.0–15.0)
Immature Granulocytes: 1 %
Lymphocytes Relative: 9 %
Lymphs Abs: 1.1 10*3/uL (ref 0.7–4.0)
MCH: 31.7 pg (ref 26.0–34.0)
MCHC: 32.8 g/dL (ref 30.0–36.0)
MCV: 96.6 fL (ref 80.0–100.0)
Monocytes Absolute: 1.4 10*3/uL — ABNORMAL HIGH (ref 0.1–1.0)
Monocytes Relative: 10 %
Neutro Abs: 10.4 10*3/uL — ABNORMAL HIGH (ref 1.7–7.7)
Neutrophils Relative %: 79 %
Platelets: 256 10*3/uL (ref 150–400)
RBC: 4.39 MIL/uL (ref 3.87–5.11)
RDW: 13.2 % (ref 11.5–15.5)
WBC: 13.2 10*3/uL — ABNORMAL HIGH (ref 4.0–10.5)
nRBC: 0 % (ref 0.0–0.2)

## 2021-09-06 LAB — TROPONIN I (HIGH SENSITIVITY): Troponin I (High Sensitivity): 4 ng/L (ref <18)

## 2021-09-06 LAB — BASIC METABOLIC PANEL
Anion gap: 9 (ref 5–15)
BUN: 18 mg/dL (ref 8–23)
CO2: 26 mmol/L (ref 22–32)
Calcium: 8.6 mg/dL — ABNORMAL LOW (ref 8.9–10.3)
Chloride: 107 mmol/L (ref 98–111)
Creatinine, Ser: 0.73 mg/dL (ref 0.44–1.00)
GFR, Estimated: >60 mL/min (ref >60)
Glucose, Bld: 102 mg/dL — ABNORMAL HIGH (ref 70–99)
Potassium: 3.4 mmol/L — ABNORMAL LOW (ref 3.5–5.1)
Sodium: 142 mmol/L (ref 135–145)

## 2021-09-06 MED ORDER — IBUPROFEN 800 MG PO TABS: 800.0000 mg | ORAL_TABLET | Freq: Four times a day (QID) | ORAL | 1 refills | Status: DC | PRN

## 2021-09-06 MED ORDER — HYDROMORPHONE HCL 2 MG PO TABS: 2.0000 mg | ORAL_TABLET | ORAL | 0 refills | Status: DC | PRN

## 2021-09-06 MED ORDER — OXYCODONE-ACETAMINOPHEN 5-325 MG PO TABS: 1.0000 | ORAL_TABLET | ORAL | 0 refills | Status: DC | PRN

## 2021-09-06 NOTE — ED Provider Triage Note (Signed)
Emergency Medicine Provider Triage Evaluation Note  [name] , a 66 y.o. female  was evaluated in triage.  Pt complains of right foot bleeding, chest pain and dizziness.  Patient had surgery on her right foot today, done by podiatry.  Upon returning home, she got up the bed to the restroom with the help of her husband.  While walking into the restroom her foot and ankle twisted.  She did not have pain because she has had a block in her right leg.  Shortly afterwards she noted blood all over the floor.  She is on anticoagulation, last dose was yesterday.  She also reports having dizziness and some chest pain at that time.  No full syncope.  Reported skin tear to ankle by EMS.  Review of Systems  Positive: Wound bleeding, chest pain Negative: Syncope  Physical Exam  BP 126/66   Pulse 80   Temp 98.5 F (36.9 C) (Oral)   Resp (!) 23   Ht '5\' 7"'$  (1.702 m)   Wt 69.4 kg   SpO2 99%   BMI 23.96 kg/m  Gen:   Awake, no distress   Resp:  Normal effort  MSK:   Decreased sensation right lower extremity Other:  Bandaging in place, right foot with blood noted on the bandage.  Bandaging in place, right ankle, no bleedthrough.  Bandages were not removed in triage.  Medical Decision Making  Medically screening exam initiated at 10:18 PM.  Appropriate orders placed.  [name] was informed that the remainder of the evaluation will be completed by another provider, this initial triage assessment does not replace that evaluation, and the importance of remaining in the ED until their evaluation is complete.     Carlisle Cater, PA-C 09/06/21 2220

## 2021-09-06 NOTE — ED Triage Notes (Signed)
Patient arrives via EMS from home. Patient was seen at surgery center today  for hammer toe surgery. Tonight patient was walking to the bathroom and noticed there was blood from her surgical site. Patient has a skin tear on her ankle that has been wrapped by EMS and bandaging on the right foot from surgery. Patient currently has a nerve block so she has little sensation in the right lower leg. No LOC. Patient takes eliquis for Afib and blot clots, but has not taken them today due to surgery. Patient reported feeling dizzy while standing on scene.

## 2021-09-07 ENCOUNTER — Telehealth: Payer: Self-pay | Admitting: *Deleted

## 2021-09-07 ENCOUNTER — Emergency Department (HOSPITAL_COMMUNITY): Payer: Medicare PPO

## 2021-09-07 DIAGNOSIS — M7989 Other specified soft tissue disorders: Secondary | ICD-10-CM | POA: Diagnosis not present

## 2021-09-07 DIAGNOSIS — Z0389 Encounter for observation for other suspected diseases and conditions ruled out: Secondary | ICD-10-CM | POA: Diagnosis not present

## 2021-09-07 LAB — TROPONIN I (HIGH SENSITIVITY): Troponin I (High Sensitivity): 5 ng/L (ref <18)

## 2021-09-07 MED ORDER — GABAPENTIN 300 MG PO CAPS
600.0000 mg | ORAL_CAPSULE | Freq: Once | ORAL | Status: AC
Start: 2021-09-07 — End: 2021-09-07
  Administered 2021-09-07: 600 mg via ORAL
  Filled 2021-09-07: qty 2

## 2021-09-07 MED ORDER — LACTATED RINGERS IV BOLUS
1000.0000 mL | Freq: Once | INTRAVENOUS | Status: AC
  Administered 2021-09-07: 1000 mL via INTRAVENOUS

## 2021-09-07 NOTE — Telephone Encounter (Signed)
Called patient to see how she was doing, no answer, left voice message for a return call.

## 2021-09-07 NOTE — ED Provider Notes (Signed)
[facility]-EMERGENCY DEPT Provider Note   CSN: [id number] Arrival date & time: 09/06/21  2145     History  Chief Complaint  Patient presents with   Wound Dehiscence   Chest Pain    [name] is a 66 y.o. female.  66 yo F who had a mechanical fall earlier tonight where she twisted and rolled her right ankle and fell down. Then a bunch of blood was on the floor. She started having anxiety associated with it to include palpitations, light headedness, chest pain and sob with nausea. All of that has improved now but states she had bleedign from distal lower leg and maybe from her foot as well.    Chest Pain      Home Medications Prior to Admission medications   Medication Sig Start Date End Date Taking? Authorizing Provider  acetaminophen (TYLENOL) 325 MG tablet Take 650 mg by mouth as needed.    [provider]  ALPRAZolam (XANAX) 0.5 MG tablet Take 0.5 mg by mouth 3 (three) times daily as needed for anxiety.    [provider]  amitriptyline (ELAVIL) 25 MG tablet Take 25mg by mouth every night 09/10/16   [provider]  anastrozole (ARIMIDEX) 1 MG tablet Take 1 tablet (1 mg total) by mouth daily. 02/04/21   Gudena, Vinay, MD  CALCIUM PO Take 2 tablets by mouth daily.    [provider]  cetirizine (ZYRTEC) 10 MG tablet Take 10 mg by mouth daily.    [provider]  Cyanocobalamin 1000 MCG/ML KIT Inject 1,000 mcg every 30 (thirty) days as directed.    [provider]  cyclobenzaprine (FLEXERIL) 10 MG tablet  01/17/20   [provider]  eletriptan (RELPAX) 40 MG tablet Take 40 mg by mouth daily as needed for migraine. May repeat in 2 hours if necessary    [provider]  ELIQUIS 5 MG TABS tablet Take 1 tablet (5 mg total) by mouth 2 (two) times daily. 11/10/20   Ursuy, Renee Lynn, PA-C  gabapentin (NEURONTIN) 600 MG tablet Take 600 mg by mouth 2 (two) times daily.    [provider]  HYDROmorphone (DILAUDID) 2 MG tablet Take 1 tablet (2 mg total) by mouth every 4 (four) hours as needed for severe pain. 09/06/21   Patel, Kevin P, DPM  ibuprofen (ADVIL) 800 MG tablet Take 1 tablet (800 mg total) by mouth every 6 (six) hours as needed. 09/06/21   Patel, Kevin P, DPM  ketoconazole (NIZORAL) 2 % cream Apply to corner of mouth nightly 09/08/20   Sheffield, Kelli R, PA-C  levothyroxine (SYNTHROID, LEVOTHROID) 100 MCG tablet Take 100 mcg daily before breakfast by mouth.    [provider]  Melatonin 5 MG CAPS Take 5 mg by mouth at bedtime.    [provider]  Menthol (BIOFREEZE) 10 % AERO     [provider]  nitrofurantoin (MACRODANTIN) 100 MG capsule Take 1 capsule (100 mg total) by mouth daily. Take for suppressive therapy to prevent UTI. 11/29/20   Miller, Mary S, MD  Omega-3 Fatty Acids (FISH OIL) 1000 MG CAPS 1 capsule    [provider]  polyethylene glycol (MIRALAX / GLYCOLAX) packet Take 17 g every evening by mouth.    [provider]  propranolol (INDERAL) 60 MG tablet Take 1 tablet (60 mg total) by mouth daily. 11/10/20   Ursuy, Renee Lynn, PA-C  spironolactone (ALDACTONE) 25 MG tablet TAKE 1/2 TABLET BY MOUTH EVERY   DAY 05/18/21   Buford Dresser, MD  Varenicline Tartrate (TYRVAYA) 0.03 MG/ACT SOLN Place into the nose.    [provider]  VITAMIN D PO Take 2 capsules by mouth daily.    [provider]      Allergies    Other, Tape, Codeine, Fentanyl, Lexapro [escitalopram], and Morphine and related    Review of Systems   Review of Systems  Cardiovascular:  Positive for chest pain.    Physical Exam Updated Vital Signs BP 127/71   Pulse 100   Temp 98.7 F (37.1 C) (Oral)   Resp (!) 28   Ht 5' 7" (1.702 m)   Wt 69.4 kg   SpO2 99%   BMI 23.96 kg/m  Physical Exam Vitals and nursing note reviewed.  Constitutional:      Appearance: She is well-developed.  HENT:     Head: Normocephalic and  atraumatic.  Cardiovascular:     Rate and Rhythm: Normal rate and regular rhythm.  Pulmonary:     Effort: No respiratory distress.     Breath sounds: No stridor.  Abdominal:     General: There is no distension.     Palpations: Abdomen is soft.  Musculoskeletal:        General: Normal range of motion.     Cervical back: Normal range of motion.  Skin:    Comments: 3 cm split with blood clot to right lower leg laterally, no obvious exposed bone.   Intact pin to toes 2-4 on right foot with mild oozing. At least one stitch appears to have pulled through the skin but overall the wounds look intact.   Neurological:     Mental Status: She is alert.     Comments: No sensation below the knee     ED Results / Procedures / Treatments   Labs (all labs ordered are listed, but only abnormal results are displayed) Labs Reviewed  CBC WITH DIFFERENTIAL/PLATELET - Abnormal; Notable for the following components:      Result Value   WBC 13.2 (*)    Neutro Abs 10.4 (*)    Monocytes Absolute 1.4 (*)    Abs Immature Granulocytes 0.08 (*)    All other components within normal limits  BASIC METABOLIC PANEL - Abnormal; Notable for the following components:   Potassium 3.4 (*)    Glucose, Bld 102 (*)    Calcium 8.6 (*)    All other components within normal limits  TROPONIN I (HIGH SENSITIVITY)  TROPONIN I (HIGH SENSITIVITY)    EKG EKG Interpretation  Date/Time:  Monday September 06 2021 22:35:45 EDT Ventricular Rate:  84 PR Interval:  195 QRS Duration: 104 QT Interval:  397 QTC Calculation: 470 R Axis:   -52 Text Interpretation: likely paced rhythm Confirmed by Merrily Pew [id number]) on 09/07/2021 12:02:15 AM  Radiology DG Foot Complete Right  Result Date: 09/07/2021 CLINICAL DATA:  Foot surgery today EXAM: RIGHT FOOT COMPLETE - 3+ VIEW COMPARISON:  None Available. FINDINGS: K-wire fixation of the 2nd-4th digits 4 hammertoe deformity. Additional fixation screws in the 2nd and 3rd metatarsal  heads. Overlying soft tissue swelling along the dorsal forefoot. No fracture is seen. IMPRESSION: Postsurgical changes involving the 2nd-4th digits with associated soft tissue swelling, as above. Electronically Signed   By: Julian Hy M.D.   On: 09/07/2021 01:18   DG Tibia/Fibula Right  Result Date: 09/07/2021 CLINICAL DATA:  Foot surgery today EXAM: RIGHT TIBIA AND FIBULA - 2 VIEW COMPARISON:  None Available. FINDINGS: No  fracture or dislocation is seen. The joint spaces are preserved. Visualized soft tissues are within normal limits. IMPRESSION: Negative. Electronically Signed   By: Sriyesh  Krishnan M.D.   On: 09/07/2021 01:17   DG Chest Port 1 View  Result Date: 09/06/2021 CLINICAL DATA:  Chest pain, lightheadedness EXAM: PORTABLE CHEST 1 VIEW COMPARISON:  04/07/2021 FINDINGS: Prominent right perihilar markings, possibly vascular. Right middle lobe opacity/pneumonia is not excluded but is considered less likely. Left lung is clear. No pleural effusion or pneumothorax. Cardiomegaly.  Left subclavian ICD. Thoracic spine fixation hardware. IMPRESSION: Prominent right perihilar markings, favored to be vascular, less likely right middle lobe opacity/pneumonia. Electronically Signed   By: Sriyesh  Krishnan M.D.   On: 09/06/2021 22:42    Procedures ..Laceration Repair  Date/Time: 09/07/2021 3:30 AM  Performed by: Mesner, Jason, MD Authorized by: Mesner, Jason, MD   Consent:    Consent obtained:  Verbal   Consent given by:  Patient   Risks discussed:  Infection, need for additional repair, nerve damage, poor wound healing, poor cosmetic result, pain, retained foreign body, tendon damage and vascular damage   Alternatives discussed:  No treatment, delayed treatment and observation Universal protocol:    Procedure explained and questions answered to patient or proxy's satisfaction: yes     Relevant documents present and verified: yes     Site/side marked: yes     Patient identity confirmed:   Hospital-assigned identification number and arm band Anesthesia:    Anesthesia method:  None (previously blocked for surgery) Pre-procedure details:    Preparation:  Patient was prepped and draped in usual sterile fashion and imaging obtained to evaluate for foreign bodies Exploration:    Limited defect created (wound extended): no     Imaging obtained: x-ray     Imaging outcome: foreign body not noted     Wound exploration: wound explored through full range of motion     Contaminated: no   Treatment:    Area cleansed with:  Saline   Amount of cleaning:  Extensive   Irrigation solution:  Sterile water   Irrigation volume:  150   Irrigation method:  Syringe   Visualized foreign bodies/material removed: no     Debridement:  None   Undermining:  None Skin repair:    Repair method:  Sutures   Suture size:  3-0   Suture material:  Prolene   Suture technique:  Simple interrupted   Number of sutures:  9 Approximation:    Approximation:  Loose (very friable skin, difficult to get perfect approximation with the edema) Repair type:    Repair type:  Simple Post-procedure details:    Dressing:  Non-adherent dressing, sterile dressing, bulky dressing and tube gauze   Procedure completion:  Tolerated well, no immediate complications    Medications Ordered in ED Medications  lactated ringers bolus 1,000 mL (0 mLs Intravenous Stopped 09/07/21 0322)  gabapentin (NEURONTIN) capsule 600 mg (600 mg Oral Given 09/07/21 0244)    ED Course/ Medical Decision Making/ A&P                           Medical Decision Making Amount and/or Complexity of Data Reviewed Radiology: ordered.  Risk Prescription drug management.  Minimal bleeding from the wounds described above, mostly from one spot on the fourth toe. Will assess for fractures. Is slightly tachycardic, NPO most of the day, will give some fluids. Hemoglobin is not low at this time, doubt hemmorhagic anemia   or pending shock, will assess  ability to fix wound on leg after xrays, it appears to have split open with some edema around which may make it difficult to fully close but will need to irrigate first to assess further.  No fractures. Suspect she had a severe ankle sprain causing the laceration. Wound repaired. Wounds redressed. Put in cam walker to help with walking and will fu w/ her foot doctor for turther managemnt of laceration and ankle.    Final Clinical Impression(s) / ED Diagnoses Final diagnoses:  Sprain of right ankle, unspecified ligament, initial encounter  Laceration of skin of right lower leg, initial encounter  Visit for wound check    Rx / DC Orders ED Discharge Orders     None         Niajah Sipos, Corene Cornea, MD 09/07/21 [phone]

## 2021-09-07 NOTE — Telephone Encounter (Signed)
Patient is wanting to speak with physician and to let him know that she had an accident w/ surgical foot and went to ER yesterday. (Notes in epic)

## 2021-09-08 ENCOUNTER — Ambulatory Visit: Payer: Medicare PPO | Admitting: Physician Assistant

## 2021-09-08 ENCOUNTER — Telehealth: Payer: Self-pay

## 2021-09-08 ENCOUNTER — Ambulatory Visit (INDEPENDENT_AMBULATORY_CARE_PROVIDER_SITE_OTHER): Payer: Medicare PPO | Admitting: Podiatry

## 2021-09-08 ENCOUNTER — Telehealth: Payer: Self-pay | Admitting: Podiatry

## 2021-09-08 DIAGNOSIS — M216X1 Other acquired deformities of right foot: Secondary | ICD-10-CM

## 2021-09-08 DIAGNOSIS — M24574 Contracture, right foot: Secondary | ICD-10-CM

## 2021-09-08 DIAGNOSIS — Z9889 Other specified postprocedural states: Secondary | ICD-10-CM

## 2021-09-08 DIAGNOSIS — M2041 Other hammer toe(s) (acquired), right foot: Secondary | ICD-10-CM

## 2021-09-08 MED ORDER — DOXYCYCLINE HYCLATE 100 MG PO TABS: 100.0000 mg | ORAL_TABLET | Freq: Two times a day (BID) | ORAL | 0 refills | Status: DC

## 2021-09-08 NOTE — Progress Notes (Signed)
Subjective:  Patient ID: [name], female    DOB: October 14, 1955,  MRN: [id number]  Chief Complaint  Patient presents with   Routine Post Op      DOS 09/06/2021 RT 2-4 HAMMERTOE CORRECTION W/CAPSULOTOMY, POSS METATARSAL OSTEOTOMY 2,3/ ankle is bleeding/ er follow up/ added per Estill Bamberg    DOS: 09/06/2021 Procedure: Right second through fourth hammertoe correction with capsulotomy with metatarsal osteotomy of second and third.  66 y.o. female returns for post-op check.  Patient states she is doing okay she had a trip and fall injury went to urgent care where she had a laceration of the anterior ankle no fractures.  She had it repaired.  She is on blood thinner and therefore bleeding some through the bandage.  Review of Systems: Negative except as noted in the HPI. Denies N/V/F/Ch.  Past Medical History:  Diagnosis Date   AF (paroxysmal atrial fibrillation) (HCC)    Arthritis    BCC (basal cell carcinoma) 03/11/2003   left distal lower leg ankle-tx dr Tonia Brooms   Biventricular cardiac pacemaker in situ    a. prior CRT-D in 2013 with downgrade to CRT-Pacemaker only in 11/2016.   Breast cancer ([place]) 08/65/7846   Chronic systolic CHF (congestive heart failure) (HCC)    Complication of anesthesia    aspiration   Essential tremor    GERD (gastroesophageal reflux disease)    otc   Hypertension    Hypothyroidism    LBBB (left bundle branch block)    conduction disorder   Migraines    Mild aortic insufficiency    NICM (nonischemic cardiomyopathy) ([place])    a. 2012: normal coronaries, EF 25-30%. b. improved to 55% 11/2016.   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   in situ- right lower leg-anterior (CX35FU)   Scoliosis     Current Outpatient Medications:    acetaminophen  (TYLENOL) 325 MG tablet, Take 650 mg by mouth as needed., Disp: , Rfl:    ALPRAZolam (XANAX) 0.5 MG tablet, Take 0.5 mg by mouth 3 (three) times daily as needed for anxiety., Disp: , Rfl:    amitriptyline (ELAVIL) 25 MG tablet, Take 35m by mouth every night, Disp: , Rfl: 5   anastrozole (ARIMIDEX) 1 MG tablet, Take 1 tablet (1 mg total) by mouth daily., Disp: 90 tablet, Rfl: 3   CALCIUM PO, Take 2 tablets by mouth daily., Disp: , Rfl:    cetirizine (ZYRTEC) 10 MG tablet, Take 10 mg by mouth daily., Disp: , Rfl:    Cyanocobalamin 1000 MCG/ML KIT, Inject 1,000 mcg every 30 (thirty) days as directed., Disp: , Rfl:    cyclobenzaprine (FLEXERIL) 10 MG tablet, , Disp: , Rfl:    doxycycline (VIBRA-TABS) 100 MG tablet, Take 1 tablet (100 mg total) by mouth 2 (two) times daily., Disp: 28 tablet, Rfl: 0   eletriptan (RELPAX) 40 MG tablet, Take 40 mg by mouth daily as needed for migraine. May repeat in 2 hours if necessary, Disp: , Rfl:    ELIQUIS 5 MG TABS tablet, Take 1 tablet (5 mg total) by mouth 2 (two) times daily., Disp: 180 tablet, Rfl: 3   gabapentin (NEURONTIN) 600 MG tablet, Take 600 mg by mouth 2 (two) times daily., Disp: , Rfl:    HYDROmorphone (DILAUDID) 2  MG tablet, Take 1 tablet (2 mg total) by mouth every 4 (four) hours as needed for severe pain., Disp: 30 tablet, Rfl: 0   ibuprofen (ADVIL) 800 MG tablet, Take 1 tablet (800 mg total) by mouth every 6 (six) hours as needed., Disp: 60 tablet, Rfl: 1   ketoconazole (NIZORAL) 2 % cream, Apply to corner of mouth nightly, Disp: 15 g, Rfl: 0   levothyroxine (SYNTHROID, LEVOTHROID) 100 MCG tablet, Take 100 mcg daily before breakfast by mouth., Disp: , Rfl:    Melatonin 5 MG CAPS, Take 5 mg by mouth at bedtime., Disp: , Rfl:    Menthol (BIOFREEZE) 10 % AERO, , Disp: , Rfl:    nitrofurantoin (MACRODANTIN) 100 MG capsule, Take 1 capsule (100 mg total) by mouth daily. Take for suppressive therapy to prevent UTI., Disp: 90 capsule, Rfl: 1   Omega-3  Fatty Acids (FISH OIL) 1000 MG CAPS, 1 capsule, Disp: , Rfl:    polyethylene glycol (MIRALAX / GLYCOLAX) packet, Take 17 g every evening by mouth., Disp: , Rfl:    propranolol (INDERAL) 60 MG tablet, Take 1 tablet (60 mg total) by mouth daily., Disp: 90 tablet, Rfl: 3   spironolactone (ALDACTONE) 25 MG tablet, TAKE 1/2 TABLET BY MOUTH EVERY DAY, Disp: 45 tablet, Rfl: 1   Varenicline Tartrate (TYRVAYA) 0.03 MG/ACT SOLN, Place into the nose., Disp: , Rfl:    VITAMIN D PO, Take 2 capsules by mouth daily., Disp: , Rfl:   Social History   Tobacco Use  Smoking Status Never   Passive exposure: Never  Smokeless Tobacco Never    Allergies  Allergen Reactions   Other Nausea And Vomiting and Other (See Comments)    Most narcotic pain meds cause N/V and CHEST PAIN; needs an antiemetic to tolerate  Also, patient was diagnosed with Raynaud's disease and was told to NOT place IV(s) in either hand because of poor circulation   Tape Other (See Comments)    PATIENT'S SKIN IS VERY THIN; IT TEARS, BRUISES, AND BLEEDS VERY EASILY (PLEASE USE COBAN WRAP, IF POSSIBLE!!)   Codeine Other (See Comments)    Chest pain   Fentanyl Other (See Comments)    Bad dreams   Lexapro [Escitalopram] Rash   Morphine And Related Nausea And Vomiting   Objective:  There were no vitals filed for this visit. There is no height or weight on file to calculate BMI. Constitutional Well developed. Well nourished.  Vascular Foot warm and well perfused. Capillary refill normal to all digits.   Neurologic Normal speech. Oriented to person, place, and time. Epicritic sensation to light touch grossly present bilaterally.  Dermatologic Skin healing well without signs of infection. Skin edges well coapted without signs of infection.  Orthopedic: Tenderness to palpation noted about the surgical site.   Radiographs: 3 views of skeletally mature the right foot x-rays that were reviewed post trauma from the ER: No signs of backing or  loosening noted.  No damage to the hammer toe arthroplasty noted. Assessment:   1. Hammertoe of right foot   2. Joint contracture of foot, right   3. Plantar flexed metatarsal, right   4. S/P foot surgery    Plan:  Patient was evaluated and treated and all questions answered.  S/p foot surgery right -Progressing as expected post-operatively. -XR: See above -WB Status: Weightbearing as tolerated in surgical shoe -Sutures: Intact.  No signs of Deis is noted no complication noted mild erythema noted -Medications: Doxycycline -Encourage patient that she will benefit  from surgical shoe rather than night splint however patient refuses to wear surgical shoe and would like to wear night splint while ambulating.  No follow-ups on file.

## 2021-09-08 NOTE — Telephone Encounter (Signed)
error 

## 2021-09-08 NOTE — Telephone Encounter (Signed)
Patient called and stated she had an accident and went to the ER. Transferred patient to Nurse triage to see If Dr. Posey Pronto could see the patient today.

## 2021-09-08 NOTE — Telephone Encounter (Signed)
Pt called back and states she was waiting on someone to call her back to get her in today to see Dr Posey Pronto and now her ankle was bleeding.  Per Estill Bamberg pt can come today at 245pm. She will come in at that time.

## 2021-09-08 NOTE — Telephone Encounter (Signed)
Encounter created in error

## 2021-09-09 DIAGNOSIS — Z0130 Encounter for examination of blood pressure without abnormal findings: Secondary | ICD-10-CM | POA: Diagnosis not present

## 2021-09-09 DIAGNOSIS — Z6823 Body mass index (BMI) 23.0-23.9, adult: Secondary | ICD-10-CM | POA: Diagnosis not present

## 2021-09-09 DIAGNOSIS — Z79899 Other long term (current) drug therapy: Secondary | ICD-10-CM | POA: Diagnosis not present

## 2021-09-09 DIAGNOSIS — R030 Elevated blood-pressure reading, without diagnosis of hypertension: Secondary | ICD-10-CM | POA: Diagnosis not present

## 2021-09-09 DIAGNOSIS — M79671 Pain in right foot: Secondary | ICD-10-CM | POA: Diagnosis not present

## 2021-09-09 DIAGNOSIS — Z9889 Other specified postprocedural states: Secondary | ICD-10-CM | POA: Diagnosis not present

## 2021-09-15 ENCOUNTER — Encounter: Payer: Medicare PPO | Admitting: Podiatry

## 2021-09-15 DIAGNOSIS — Z91041 Radiographic dye allergy status: Secondary | ICD-10-CM | POA: Diagnosis not present

## 2021-09-15 DIAGNOSIS — I509 Heart failure, unspecified: Secondary | ICD-10-CM | POA: Diagnosis not present

## 2021-09-15 DIAGNOSIS — C50919 Malignant neoplasm of unspecified site of unspecified female breast: Secondary | ICD-10-CM | POA: Diagnosis not present

## 2021-09-15 DIAGNOSIS — Z950 Presence of cardiac pacemaker: Secondary | ICD-10-CM | POA: Diagnosis not present

## 2021-09-15 DIAGNOSIS — Z85828 Personal history of other malignant neoplasm of skin: Secondary | ICD-10-CM | POA: Diagnosis not present

## 2021-09-15 DIAGNOSIS — I110 Hypertensive heart disease with heart failure: Secondary | ICD-10-CM | POA: Diagnosis not present

## 2021-09-15 DIAGNOSIS — Z86718 Personal history of other venous thrombosis and embolism: Secondary | ICD-10-CM | POA: Diagnosis not present

## 2021-09-15 DIAGNOSIS — I4891 Unspecified atrial fibrillation: Secondary | ICD-10-CM | POA: Diagnosis not present

## 2021-09-15 DIAGNOSIS — D6869 Other thrombophilia: Secondary | ICD-10-CM | POA: Diagnosis not present

## 2021-09-15 DIAGNOSIS — F329 Major depressive disorder, single episode, unspecified: Secondary | ICD-10-CM | POA: Diagnosis not present

## 2021-09-15 DIAGNOSIS — Z885 Allergy status to narcotic agent status: Secondary | ICD-10-CM | POA: Diagnosis not present

## 2021-09-15 DIAGNOSIS — E890 Postprocedural hypothyroidism: Secondary | ICD-10-CM | POA: Diagnosis not present

## 2021-09-15 DIAGNOSIS — F4323 Adjustment disorder with mixed anxiety and depressed mood: Secondary | ICD-10-CM | POA: Diagnosis not present

## 2021-09-15 DIAGNOSIS — G43909 Migraine, unspecified, not intractable, without status migrainosus: Secondary | ICD-10-CM | POA: Diagnosis not present

## 2021-09-15 DIAGNOSIS — Z8249 Family history of ischemic heart disease and other diseases of the circulatory system: Secondary | ICD-10-CM | POA: Diagnosis not present

## 2021-09-16 DIAGNOSIS — I509 Heart failure, unspecified: Secondary | ICD-10-CM | POA: Diagnosis not present

## 2021-09-16 DIAGNOSIS — D72829 Elevated white blood cell count, unspecified: Secondary | ICD-10-CM | POA: Diagnosis not present

## 2021-09-16 DIAGNOSIS — R030 Elevated blood-pressure reading, without diagnosis of hypertension: Secondary | ICD-10-CM | POA: Diagnosis not present

## 2021-09-16 DIAGNOSIS — Z79899 Other long term (current) drug therapy: Secondary | ICD-10-CM | POA: Diagnosis not present

## 2021-09-16 DIAGNOSIS — M79671 Pain in right foot: Secondary | ICD-10-CM | POA: Diagnosis not present

## 2021-09-16 DIAGNOSIS — Z950 Presence of cardiac pacemaker: Secondary | ICD-10-CM | POA: Diagnosis not present

## 2021-09-16 DIAGNOSIS — Z0130 Encounter for examination of blood pressure without abnormal findings: Secondary | ICD-10-CM | POA: Diagnosis not present

## 2021-09-16 DIAGNOSIS — E0590 Thyrotoxicosis, unspecified without thyrotoxic crisis or storm: Secondary | ICD-10-CM | POA: Diagnosis not present

## 2021-09-16 DIAGNOSIS — Z6823 Body mass index (BMI) 23.0-23.9, adult: Secondary | ICD-10-CM | POA: Diagnosis not present

## 2021-09-27 ENCOUNTER — Ambulatory Visit: Payer: Medicare PPO

## 2021-09-27 NOTE — Progress Notes (Signed)
Remote pacemaker transmission.   

## 2021-09-29 ENCOUNTER — Ambulatory Visit (INDEPENDENT_AMBULATORY_CARE_PROVIDER_SITE_OTHER): Payer: Medicare PPO | Admitting: Podiatry

## 2021-09-29 ENCOUNTER — Ambulatory Visit (INDEPENDENT_AMBULATORY_CARE_PROVIDER_SITE_OTHER): Payer: Medicare PPO

## 2021-09-29 DIAGNOSIS — Z9889 Other specified postprocedural states: Secondary | ICD-10-CM

## 2021-09-29 DIAGNOSIS — M24574 Contracture, right foot: Secondary | ICD-10-CM

## 2021-09-29 DIAGNOSIS — M2041 Other hammer toe(s) (acquired), right foot: Secondary | ICD-10-CM

## 2021-09-29 DIAGNOSIS — M216X1 Other acquired deformities of right foot: Secondary | ICD-10-CM

## 2021-09-29 NOTE — Progress Notes (Signed)
Subjective:  Patient ID: [name], female    DOB: 1955-12-22,  MRN: [id number]  Chief Complaint  Patient presents with   Routine Post Op    POV #1 DOS 09/06/2021 RT 2-4 HAMMERTOE CORRECTION W/CAPSULOTOMY, POSS METATARSAL OSTEOTOMY 2,3    DOS: 09/06/2021 Procedure: Right second through fourth hammertoe correction with capsulotomy with metatarsal osteotomy of second and third.  Incision is fine.  No dehiscence noted.  66 y.o. female returns for post-op check.  Patient states that she is doing okay.  Her incision is intact.  No signs of dehiscence or complication noted.  Review of Systems: Negative except as noted in the HPI. Denies N/V/F/Ch.  Past Medical History:  Diagnosis Date   AF (paroxysmal atrial fibrillation) (HCC)    Arthritis    BCC (basal cell carcinoma) 03/11/2003   left distal lower leg ankle-tx dr Tonia Brooms   Biventricular cardiac pacemaker in situ    a. prior CRT-D in 2013 with downgrade to CRT-Pacemaker only in 11/2016.   Breast cancer ([place]) 19/14/7829   Chronic systolic CHF (congestive heart failure) (HCC)    Complication of anesthesia    aspiration   Essential tremor    GERD (gastroesophageal reflux disease)    otc   Hypertension    Hypothyroidism    LBBB (left bundle branch block)    conduction disorder   Migraines    Mild aortic insufficiency    NICM (nonischemic cardiomyopathy) ([place])    a. 2012: normal coronaries, EF 25-30%. b. improved to 55% 11/2016.   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   in situ- right lower leg-anterior (CX35FU)   Scoliosis     Current Outpatient Medications:    acetaminophen (TYLENOL) 325 MG tablet, Take 650 mg by mouth as needed., Disp: , Rfl:    ALPRAZolam (XANAX) 0.5 MG tablet, Take 0.5 mg by mouth 3  (three) times daily as needed for anxiety., Disp: , Rfl:    amitriptyline (ELAVIL) 25 MG tablet, Take 76m by mouth every night, Disp: , Rfl: 5   anastrozole (ARIMIDEX) 1 MG tablet, Take 1 tablet (1 mg total) by mouth daily., Disp: 90 tablet, Rfl: 3   CALCIUM PO, Take 2 tablets by mouth daily., Disp: , Rfl:    cetirizine (ZYRTEC) 10 MG tablet, Take 10 mg by mouth daily., Disp: , Rfl:    Cyanocobalamin 1000 MCG/ML KIT, Inject 1,000 mcg every 30 (thirty) days as directed., Disp: , Rfl:    cyclobenzaprine (FLEXERIL) 10 MG tablet, , Disp: , Rfl:    doxycycline (VIBRA-TABS) 100 MG tablet, Take 1 tablet (100 mg total) by mouth 2 (two) times daily., Disp: 28 tablet, Rfl: 0   eletriptan (RELPAX) 40 MG tablet, Take 40 mg by mouth daily as needed for migraine. May repeat in 2 hours if necessary, Disp: , Rfl:    ELIQUIS 5 MG TABS tablet, Take 1 tablet (5 mg total) by mouth 2 (two) times daily., Disp: 180 tablet, Rfl: 3   gabapentin (NEURONTIN) 600 MG tablet, Take 600 mg by mouth 2 (two) times daily., Disp: , Rfl:    HYDROmorphone (DILAUDID) 2 MG tablet, Take 1 tablet (2 mg total) by mouth every 4 (four) hours as needed for severe pain., Disp: 30 tablet, Rfl: 0   ibuprofen (  ADVIL) 800 MG tablet, Take 1 tablet (800 mg total) by mouth every 6 (six) hours as needed., Disp: 60 tablet, Rfl: 1   ketoconazole (NIZORAL) 2 % cream, Apply to corner of mouth nightly, Disp: 15 g, Rfl: 0   levothyroxine (SYNTHROID, LEVOTHROID) 100 MCG tablet, Take 100 mcg daily before breakfast by mouth., Disp: , Rfl:    Melatonin 5 MG CAPS, Take 5 mg by mouth at bedtime., Disp: , Rfl:    Menthol (BIOFREEZE) 10 % AERO, , Disp: , Rfl:    nitrofurantoin (MACRODANTIN) 100 MG capsule, Take 1 capsule (100 mg total) by mouth daily. Take for suppressive therapy to prevent UTI., Disp: 90 capsule, Rfl: 1   Omega-3 Fatty Acids (FISH OIL) 1000 MG CAPS, 1 capsule, Disp: , Rfl:    polyethylene glycol (MIRALAX / GLYCOLAX) packet, Take 17 g every  evening by mouth., Disp: , Rfl:    propranolol (INDERAL) 60 MG tablet, Take 1 tablet (60 mg total) by mouth daily., Disp: 90 tablet, Rfl: 3   spironolactone (ALDACTONE) 25 MG tablet, TAKE 1/2 TABLET BY MOUTH EVERY DAY, Disp: 45 tablet, Rfl: 1   Varenicline Tartrate (TYRVAYA) 0.03 MG/ACT SOLN, Place into the nose., Disp: , Rfl:    VITAMIN D PO, Take 2 capsules by mouth daily., Disp: , Rfl:   Social History   Tobacco Use  Smoking Status Never   Passive exposure: Never  Smokeless Tobacco Never    Allergies  Allergen Reactions   Other Nausea And Vomiting and Other (See Comments)    Most narcotic pain meds cause N/V and CHEST PAIN; needs an antiemetic to tolerate  Also, patient was diagnosed with Raynaud's disease and was told to NOT place IV(s) in either hand because of poor circulation   Tape Other (See Comments)    PATIENT'S SKIN IS VERY THIN; IT TEARS, BRUISES, AND BLEEDS VERY EASILY (PLEASE USE COBAN WRAP, IF POSSIBLE!!)   Codeine Other (See Comments)    Chest pain   Fentanyl Other (See Comments)    Bad dreams   Lexapro [Escitalopram] Rash   Morphine And Related Nausea And Vomiting   Objective:  There were no vitals filed for this visit. There is no height or weight on file to calculate BMI. Constitutional Well developed. Well nourished.  Vascular Foot warm and well perfused. Capillary refill normal to all digits.   Neurologic Normal speech. Oriented to person, place, and time. Epicritic sensation to light touch grossly present bilaterally.  Dermatologic Skin healing well without signs of infection. Skin edges well coapted without signs of infection.  Orthopedic: Tenderness to palpation noted about the surgical site.   Radiographs: 3 views of skeletally mature the right foot x-rays that were reviewed post trauma from the ER: No signs of backing or loosening noted.  No damage to the hammer toe arthroplasty noted. Assessment:   1. Hammertoe of right foot   2. Joint  contracture of foot, right   3. Plantar flexed metatarsal, right   4. S/P foot surgery    Plan:  Patient was evaluated and treated and all questions answered.  S/p foot surgery right -Progressing as expected post-operatively. -XR: See above -WB Status: Weightbearing as tolerated in surgical shoe -Sutures: Intact.  No signs of Deis is noted no complication noted mild erythema noted -Medications: None -We will plan on removing the stitches and pain during next clinical visit as I will be 6 weeks mark.  No follow-ups on file.

## 2021-09-30 DIAGNOSIS — E0590 Thyrotoxicosis, unspecified without thyrotoxic crisis or storm: Secondary | ICD-10-CM | POA: Diagnosis not present

## 2021-09-30 DIAGNOSIS — Z0130 Encounter for examination of blood pressure without abnormal findings: Secondary | ICD-10-CM | POA: Diagnosis not present

## 2021-09-30 DIAGNOSIS — Z6823 Body mass index (BMI) 23.0-23.9, adult: Secondary | ICD-10-CM | POA: Diagnosis not present

## 2021-09-30 DIAGNOSIS — D72829 Elevated white blood cell count, unspecified: Secondary | ICD-10-CM | POA: Diagnosis not present

## 2021-09-30 DIAGNOSIS — Z0000 Encounter for general adult medical examination without abnormal findings: Secondary | ICD-10-CM | POA: Diagnosis not present

## 2021-09-30 DIAGNOSIS — M79671 Pain in right foot: Secondary | ICD-10-CM | POA: Diagnosis not present

## 2021-09-30 DIAGNOSIS — E538 Deficiency of other specified B group vitamins: Secondary | ICD-10-CM | POA: Diagnosis not present

## 2021-09-30 DIAGNOSIS — I509 Heart failure, unspecified: Secondary | ICD-10-CM | POA: Diagnosis not present

## 2021-09-30 DIAGNOSIS — R030 Elevated blood-pressure reading, without diagnosis of hypertension: Secondary | ICD-10-CM | POA: Diagnosis not present

## 2021-10-01 ENCOUNTER — Other Ambulatory Visit: Payer: Self-pay | Admitting: Physician Assistant

## 2021-10-01 ENCOUNTER — Other Ambulatory Visit (HOSPITAL_BASED_OUTPATIENT_CLINIC_OR_DEPARTMENT_OTHER): Payer: Self-pay | Admitting: Obstetrics & Gynecology

## 2021-10-01 DIAGNOSIS — N390 Urinary tract infection, site not specified: Secondary | ICD-10-CM

## 2021-10-01 NOTE — Telephone Encounter (Signed)
LMOVM for pt to call office for appt

## 2021-10-04 NOTE — Telephone Encounter (Signed)
Prescription refill request for Eliquis received. Indication: afib  Last office visit: [name] 08/23/2021 Scr: 0.73, 09/06/2021 Age: 66 yo  Weight: 69.4 kg   Refill sent.

## 2021-10-06 ENCOUNTER — Telehealth: Payer: Self-pay | Admitting: *Deleted

## 2021-10-06 NOTE — Telephone Encounter (Signed)
Patient is calling concerning her appointment. Returned call back to her, no answer,left message for call back.

## 2021-10-13 ENCOUNTER — Ambulatory Visit (INDEPENDENT_AMBULATORY_CARE_PROVIDER_SITE_OTHER): Payer: Medicare PPO | Admitting: Podiatry

## 2021-10-13 ENCOUNTER — Encounter: Payer: Self-pay | Admitting: Podiatry

## 2021-10-13 DIAGNOSIS — M24574 Contracture, right foot: Secondary | ICD-10-CM

## 2021-10-13 DIAGNOSIS — M216X1 Other acquired deformities of right foot: Secondary | ICD-10-CM

## 2021-10-13 DIAGNOSIS — M2041 Other hammer toe(s) (acquired), right foot: Secondary | ICD-10-CM

## 2021-10-13 DIAGNOSIS — Z9889 Other specified postprocedural states: Secondary | ICD-10-CM

## 2021-10-13 MED ORDER — DOXYCYCLINE HYCLATE 100 MG PO TABS: 100.0000 mg | ORAL_TABLET | Freq: Two times a day (BID) | ORAL | 0 refills | Status: DC

## 2021-10-13 MED ORDER — HYDROMORPHONE HCL 2 MG PO TABS: 2.0000 mg | ORAL_TABLET | ORAL | 0 refills | Status: DC | PRN

## 2021-10-13 NOTE — Progress Notes (Signed)
Subjective:  Patient ID: [name], female    DOB: 08/24/1955,  MRN: [id number]  Chief Complaint  Patient presents with   Routine Post Op     POV #2 DOS 09/06/2021 RT 2-4 HAMMERTOE CORRECTION W/CAPSULOTOMY, POSS METATARSAL OSTEOTOMY 2,3    DOS: 09/06/2021 Procedure: Right second through fourth hammertoe correction with capsulotomy with metatarsal osteotomy of second and third.  Incision is fine.  No dehiscence noted.  66 y.o. female returns for post-op check.  Patient states that she is doing okay.  Her incision is intact.  No signs of dehiscence or complication noted.  Review of Systems: Negative except as noted in the HPI. Denies N/V/F/Ch.  Past Medical History:  Diagnosis Date   AF (paroxysmal atrial fibrillation) (HCC)    Arthritis    BCC (basal cell carcinoma) 03/11/2003   left distal lower leg ankle-tx dr Tonia Brooms   Biventricular cardiac pacemaker in situ    a. prior CRT-D in 2013 with downgrade to CRT-Pacemaker only in 11/2016.   Breast cancer ([place]) 01/77/9390   Chronic systolic CHF (congestive heart failure) (HCC)    Complication of anesthesia    aspiration   Essential tremor    GERD (gastroesophageal reflux disease)    otc   Hypertension    Hypothyroidism    LBBB (left bundle branch block)    conduction disorder   Migraines    Mild aortic insufficiency    NICM (nonischemic cardiomyopathy) ([place])    a. 2012: normal coronaries, EF 25-30%. b. improved to 55% 11/2016.   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   in situ- right lower leg-anterior (CX35FU)   Scoliosis     Current Outpatient Medications:    doxycycline (VIBRA-TABS) 100 MG tablet, Take 1 tablet (100 mg total) by mouth 2 (two) times daily., Disp: 20 tablet, Rfl: 0   HYDROmorphone  (DILAUDID) 2 MG tablet, Take 1 tablet (2 mg total) by mouth every 4 (four) hours as needed for severe pain., Disp: 30 tablet, Rfl: 0   acetaminophen (TYLENOL) 325 MG tablet, Take 650 mg by mouth as needed., Disp: , Rfl:    ALPRAZolam (XANAX) 0.5 MG tablet, Take 0.5 mg by mouth 3 (three) times daily as needed for anxiety., Disp: , Rfl:    amitriptyline (ELAVIL) 25 MG tablet, Take 33m by mouth every night, Disp: , Rfl: 5   anastrozole (ARIMIDEX) 1 MG tablet, Take 1 tablet (1 mg total) by mouth daily., Disp: 90 tablet, Rfl: 3   apixaban (ELIQUIS) 5 MG TABS tablet, TAKE 1 TABLET BY MOUTH TWICE A DAY, Disp: 180 tablet, Rfl: 1   CALCIUM PO, Take 2 tablets by mouth daily., Disp: , Rfl:    cetirizine (ZYRTEC) 10 MG tablet, Take 10 mg by mouth daily., Disp: , Rfl:    Cyanocobalamin 1000 MCG/ML KIT, Inject 1,000 mcg every 30 (thirty) days as directed., Disp: , Rfl:    cyclobenzaprine (FLEXERIL) 10 MG tablet, , Disp: , Rfl:    doxycycline (VIBRA-TABS) 100 MG tablet, Take 1 tablet (100 mg total) by mouth 2 (two) times daily., Disp: 28 tablet, Rfl: 0   eletriptan (RELPAX) 40 MG tablet, Take 40 mg by mouth daily as needed for migraine. May repeat in 2 hours if necessary, Disp: , Rfl:  gabapentin (NEURONTIN) 600 MG tablet, Take 600 mg by mouth 2 (two) times daily., Disp: , Rfl:    HYDROmorphone (DILAUDID) 2 MG tablet, Take 1 tablet (2 mg total) by mouth every 4 (four) hours as needed for severe pain., Disp: 30 tablet, Rfl: 0   ibuprofen (ADVIL) 800 MG tablet, Take 1 tablet (800 mg total) by mouth every 6 (six) hours as needed., Disp: 60 tablet, Rfl: 1   ketoconazole (NIZORAL) 2 % cream, Apply to corner of mouth nightly, Disp: 15 g, Rfl: 0   levothyroxine (SYNTHROID, LEVOTHROID) 100 MCG tablet, Take 100 mcg daily before breakfast by mouth., Disp: , Rfl:    Melatonin 5 MG CAPS, Take 5 mg by mouth at bedtime., Disp: , Rfl:    Menthol (BIOFREEZE) 10 % AERO, , Disp: , Rfl:    nitrofurantoin (MACRODANTIN) 100 MG  capsule, TAKE 1 CAPSULE (100 MG TOTAL) BY MOUTH DAILY. TAKE FOR SUPPRESSIVE THERAPY TO PREVENT UTI., Disp: 90 capsule, Rfl: 0   Omega-3 Fatty Acids (FISH OIL) 1000 MG CAPS, 1 capsule, Disp: , Rfl:    polyethylene glycol (MIRALAX / GLYCOLAX) packet, Take 17 g every evening by mouth., Disp: , Rfl:    propranolol (INDERAL) 60 MG tablet, Take 1 tablet (60 mg total) by mouth daily., Disp: 90 tablet, Rfl: 3   spironolactone (ALDACTONE) 25 MG tablet, TAKE 1/2 TABLET BY MOUTH EVERY DAY, Disp: 45 tablet, Rfl: 1   Varenicline Tartrate (TYRVAYA) 0.03 MG/ACT SOLN, Place into the nose., Disp: , Rfl:    VITAMIN D PO, Take 2 capsules by mouth daily., Disp: , Rfl:   Social History   Tobacco Use  Smoking Status Never   Passive exposure: Never  Smokeless Tobacco Never    Allergies  Allergen Reactions   Other Nausea And Vomiting and Other (See Comments)    Most narcotic pain meds cause N/V and CHEST PAIN; needs an antiemetic to tolerate  Also, patient was diagnosed with Raynaud's disease and was told to NOT place IV(s) in either hand because of poor circulation   Tape Other (See Comments)    PATIENT'S SKIN IS VERY THIN; IT TEARS, BRUISES, AND BLEEDS VERY EASILY (PLEASE USE COBAN WRAP, IF POSSIBLE!!)   Codeine Other (See Comments)    Chest pain   Fentanyl Other (See Comments)    Bad dreams   Lexapro [Escitalopram] Rash   Morphine And Related Nausea And Vomiting   Objective:  There were no vitals filed for this visit. There is no height or weight on file to calculate BMI. Constitutional Well developed. Well nourished.  Vascular Foot warm and well perfused. Capillary refill normal to all digits.   Neurologic Normal speech. Oriented to person, place, and time. Epicritic sensation to light touch grossly present bilaterally.  Dermatologic Skin completely reepithelialized.  No signs of dehiscence noted.  No complication noted.  Reduction of deformity noted  Orthopedic: No further tenderness to  palpation noted about the surgical site.   Radiographs: 3 views of skeletally mature the right foot x-rays that were reviewed post trauma from the ER: No signs of backing or loosening noted.  No damage to the hammer toe arthroplasty noted. Assessment:   1. Hammertoe of right foot   2. Joint contracture of foot, right   3. Plantar flexed metatarsal, right   4. S/P foot surgery     Plan:  Patient was evaluated and treated and all questions answered.  S/p foot surgery right -Plan healed and patient is officially discharged from my  care.  At this time I discussed with her that given that she is improving considerably she can return to regular shoes if any foot and ankle issues arise in the future I will see her back.  She is having little bit of pain for which she would like pain medication which was sent to the pharmacy.  No follow-ups on file.

## 2021-10-14 DIAGNOSIS — E0590 Thyrotoxicosis, unspecified without thyrotoxic crisis or storm: Secondary | ICD-10-CM | POA: Diagnosis not present

## 2021-10-14 DIAGNOSIS — M79671 Pain in right foot: Secondary | ICD-10-CM | POA: Diagnosis not present

## 2021-10-14 DIAGNOSIS — R030 Elevated blood-pressure reading, without diagnosis of hypertension: Secondary | ICD-10-CM | POA: Diagnosis not present

## 2021-10-14 DIAGNOSIS — I509 Heart failure, unspecified: Secondary | ICD-10-CM | POA: Diagnosis not present

## 2021-10-14 DIAGNOSIS — Z950 Presence of cardiac pacemaker: Secondary | ICD-10-CM | POA: Diagnosis not present

## 2021-10-14 DIAGNOSIS — Z6823 Body mass index (BMI) 23.0-23.9, adult: Secondary | ICD-10-CM | POA: Diagnosis not present

## 2021-10-14 DIAGNOSIS — Z0130 Encounter for examination of blood pressure without abnormal findings: Secondary | ICD-10-CM | POA: Diagnosis not present

## 2021-10-14 DIAGNOSIS — K130 Diseases of lips: Secondary | ICD-10-CM | POA: Diagnosis not present

## 2021-10-14 DIAGNOSIS — Z8669 Personal history of other diseases of the nervous system and sense organs: Secondary | ICD-10-CM | POA: Diagnosis not present

## 2021-10-18 ENCOUNTER — Telehealth: Payer: Self-pay

## 2021-10-19 ENCOUNTER — Other Ambulatory Visit: Payer: Self-pay | Admitting: Podiatry

## 2021-10-19 MED ORDER — CLOTRIMAZOLE-BETAMETHASONE 1-0.05 % EX CREA: 1.0000 | TOPICAL_CREAM | Freq: Every day | CUTANEOUS | 0 refills | Status: DC

## 2021-10-19 NOTE — Progress Notes (Unsigned)
[name]

## 2021-10-20 ENCOUNTER — Ambulatory Visit (HOSPITAL_BASED_OUTPATIENT_CLINIC_OR_DEPARTMENT_OTHER): Payer: Medicare PPO | Admitting: Obstetrics & Gynecology

## 2021-10-20 ENCOUNTER — Encounter: Payer: Medicare PPO | Admitting: Podiatry

## 2021-10-20 DIAGNOSIS — K921 Melena: Secondary | ICD-10-CM | POA: Diagnosis not present

## 2021-10-20 DIAGNOSIS — H9201 Otalgia, right ear: Secondary | ICD-10-CM | POA: Diagnosis not present

## 2021-10-20 DIAGNOSIS — R1032 Left lower quadrant pain: Secondary | ICD-10-CM | POA: Diagnosis not present

## 2021-10-21 NOTE — Telephone Encounter (Signed)
Left a detailed message about the antibiotics recommended by Dr. Posey Pronto and if she wanted try one of those he would send it to her pharmacy. I advised the patient to call the office back with her decision.

## 2021-11-03 ENCOUNTER — Encounter: Payer: Medicare PPO | Admitting: Podiatry

## 2021-11-08 ENCOUNTER — Ambulatory Visit: Payer: Medicare PPO | Attending: Internal Medicine | Admitting: Internal Medicine

## 2021-11-08 ENCOUNTER — Encounter: Payer: Self-pay | Admitting: Internal Medicine

## 2021-11-08 VITALS — BP 108/70 | HR 70 | Ht 67.5 in | Wt 155.0 lb

## 2021-11-08 DIAGNOSIS — I5022 Chronic systolic (congestive) heart failure: Secondary | ICD-10-CM

## 2021-11-08 DIAGNOSIS — I480 Paroxysmal atrial fibrillation: Secondary | ICD-10-CM

## 2021-11-08 DIAGNOSIS — Z95810 Presence of automatic (implantable) cardiac defibrillator: Secondary | ICD-10-CM | POA: Insufficient documentation

## 2021-11-08 LAB — CUP PACEART INCLINIC DEVICE CHECK
Battery Remaining Longevity: 56 mo
Battery Voltage: 2.95 V
Brady Statistic AP VP Percent: 1.55 %
Brady Statistic AP VS Percent: 0.05 %
Brady Statistic AS VP Percent: 96.66 %
Brady Statistic AS VS Percent: 1.74 %
Brady Statistic RA Percent Paced: 1.61 %
Brady Statistic RV Percent Paced: 3.96 %
Date Time Interrogation Session: 20231030173554
Implantable Lead Connection Status: 753985
Implantable Lead Connection Status: 753985
Implantable Lead Connection Status: 753985
Implantable Lead Implant Date: 20130110
Implantable Lead Implant Date: 20130110
Implantable Lead Implant Date: 20130110
Implantable Lead Location: 753858
Implantable Lead Location: 753859
Implantable Lead Location: 753860
Implantable Lead Model: 4196
Implantable Lead Model: 5076
Implantable Lead Model: 6935
Implantable Pulse Generator Implant Date: 20181115
Lead Channel Impedance Value: 1007 Ohm
Lead Channel Impedance Value: 1330 Ohm
Lead Channel Impedance Value: 342 Ohm
Lead Channel Impedance Value: 418 Ohm
Lead Channel Impedance Value: 627 Ohm
Lead Channel Impedance Value: 779 Ohm
Lead Channel Impedance Value: 779 Ohm
Lead Channel Impedance Value: 931 Ohm
Lead Channel Impedance Value: 950 Ohm
Lead Channel Pacing Threshold Amplitude: 0.625 V
Lead Channel Pacing Threshold Amplitude: 1.75 V
Lead Channel Pacing Threshold Amplitude: 2.5 V
Lead Channel Pacing Threshold Pulse Width: 0.4 ms
Lead Channel Pacing Threshold Pulse Width: 0.4 ms
Lead Channel Pacing Threshold Pulse Width: 0.8 ms
Lead Channel Sensing Intrinsic Amplitude: 0.5 mV
Lead Channel Sensing Intrinsic Amplitude: 14 mV
Lead Channel Sensing Intrinsic Amplitude: 17.5 mV
Lead Channel Sensing Intrinsic Amplitude: 2 mV
Lead Channel Setting Pacing Amplitude: 2 V
Lead Channel Setting Pacing Amplitude: 2.5 V
Lead Channel Setting Pacing Amplitude: 4 V
Lead Channel Setting Pacing Pulse Width: 0.8 ms
Lead Channel Setting Pacing Pulse Width: 1 ms
Lead Channel Setting Sensing Sensitivity: 0.9 mV
Zone Setting Status: 755011
Zone Setting Status: 755011

## 2021-11-08 NOTE — Progress Notes (Signed)
HPI [name] returns today for followup. She is a pleasant 67 yo woman with a h/o non-ischemic CM, LBBB, s/p BIV PPM insertion. She had her device last placed under the left pectoralis muscle. She has been diagnosed with breast CA and undergone left breast tumor excision by Dr. Georgette Dover. She has otherwise done well though notes that she has had a hammer toe operated on. She c/o easy bruising from her arms. She has a long h/o remote sun exposure. Allergies  Allergen Reactions   Other Nausea And Vomiting and Other (See Comments)    Most narcotic pain meds cause N/V and CHEST PAIN; needs an antiemetic to tolerate  Also, patient was diagnosed with Raynaud's disease and was told to NOT place IV(s) in either hand because of poor circulation   Iodine Other (See Comments)    "Blisters"   Tape Other (See Comments)    PATIENT'S SKIN IS VERY THIN; IT TEARS, BRUISES, AND BLEEDS VERY EASILY (PLEASE USE COBAN WRAP, IF POSSIBLE!!)   Codeine Other (See Comments)    Chest pain   Fentanyl Other (See Comments)    Bad dreams   Lexapro [Escitalopram] Rash   Morphine And Related Nausea And Vomiting     Current Outpatient Medications  Medication Sig Dispense Refill   acetaminophen (TYLENOL) 325 MG tablet Take 650 mg by mouth as needed.     ALPRAZolam (XANAX) 0.5 MG tablet Take 0.5 mg by mouth 3 (three) times daily as needed for anxiety.     amitriptyline (ELAVIL) 25 MG tablet Take 28m by mouth every night  5   anastrozole (ARIMIDEX) 1 MG tablet Take 1 tablet (1 mg total) by mouth daily. 90 tablet 3   apixaban (ELIQUIS) 5 MG TABS tablet TAKE 1 TABLET BY MOUTH TWICE A DAY 180 tablet 1   CALCIUM PO Take 2 tablets by mouth daily.     cetirizine (ZYRTEC) 10 MG tablet Take 10 mg by mouth daily.     Cyanocobalamin 1000 MCG/ML KIT Inject 1,000 mcg every 30 (thirty) days as directed.     eletriptan (RELPAX) 40 MG tablet Take 40 mg by mouth daily as needed for migraine. May repeat in 2 hours if necessary      gabapentin (NEURONTIN) 600 MG tablet Take 600 mg by mouth 2 (two) times daily.     ketoconazole (NIZORAL) 2 % cream Apply to corner of mouth nightly 15 g 0   levothyroxine (SYNTHROID, LEVOTHROID) 100 MCG tablet Take 100 mcg daily before breakfast by mouth.     Melatonin 5 MG CAPS Take 5 mg by mouth at bedtime.     Menthol (BIOFREEZE) 10 % AERO      Omega-3 Fatty Acids (FISH OIL) 1000 MG CAPS 1 capsule     polyethylene glycol (MIRALAX / GLYCOLAX) packet Take 17 g every evening by mouth.     propranolol (INDERAL) 60 MG tablet Take 1 tablet (60 mg total) by mouth daily. 90 tablet 3   spironolactone (ALDACTONE) 25 MG tablet TAKE 1/2 TABLET BY MOUTH EVERY DAY 45 tablet 1   Varenicline Tartrate (TYRVAYA) 0.03 MG/ACT SOLN Place into the nose.     VITAMIN D PO Take 2 capsules by mouth daily.     doxycycline (VIBRA-TABS) 100 MG tablet Take 1 tablet (100 mg total) by mouth 2 (two) times daily. (Patient not taking: Reported on 11/08/2021) 28 tablet 0   HYDROmorphone (DILAUDID) 2 MG tablet Take 1 tablet (2 mg total) by mouth every  4 (four) hours as needed for severe pain. (Patient not taking: Reported on 11/08/2021) 30 tablet 0   No current facility-administered medications for this visit.     Past Medical History:  Diagnosis Date   AF (paroxysmal atrial fibrillation) (HCC)    Arthritis    BCC (basal cell carcinoma) 03/11/2003   left distal lower leg ankle-tx dr Tonia Brooms   Biventricular cardiac pacemaker in situ    a. prior CRT-D in 2013 with downgrade to CRT-Pacemaker only in 11/2016.   Breast cancer ([place]) 16/38/4536   Chronic systolic CHF (congestive heart failure) (HCC)    Complication of anesthesia    aspiration   Essential tremor    GERD (gastroesophageal reflux disease)    otc   Hypertension    Hypothyroidism    LBBB (left bundle branch block)    conduction disorder   Migraines    Mild aortic insufficiency    NICM (nonischemic cardiomyopathy) ([place])    a. 2012: normal coronaries, EF  25-30%. b. improved to 55% 11/2016.   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   in situ- right lower leg-anterior (CX35FU)   Scoliosis     ROS:   All systems reviewed and negative except as noted in the HPI.   Past Surgical History:  Procedure Laterality Date   ARTERY BIOPSY  12/29/2011   Procedure: MINOR BIOPSY TEMPORAL ARTERY;  Surgeon: Edward Jolly, MD;  Location: [place];  Service: General;  Laterality: Right;  Right temporal artery biopsy   BACK SURGERY  12/31/2009   "for flat back syndrome; fused bottom of my back"   BI-VENTRICULAR IMPLANTABLE CARDIOVERTER DEFIBRILLATOR Left 01/20/2011   Procedure: BI-VENTRICULAR IMPLANTABLE CARDIOVERTER DEFIBRILLATOR  (CRT-D);  Surgeon: Evans Lance, MD;  Location: [facility] CATH LAB;  Service: Cardiovascular;  Laterality: Left;   BIV ICD GENERATOR CHANGEOUT N/A 11/24/2016   Procedure: BIV ICD GENERATOR CHANGEOUT;  Surgeon: Evans Lance, MD;  Location: [place] CV LAB;  Service: Cardiovascular;  Laterality: N/A;   BREAST LUMPECTOMY WITH RADIOACTIVE SEED LOCALIZATION Left 01/22/2020   Procedure: LEFT BREAST LUMPECTOMY WITH RADIOACTIVE SEED LOCALIZATION;  Surgeon: Donnie Mesa, MD;  Location: [place];  Service: General;  Laterality: Left;  LMA   CHOLECYSTECTOMY  ~1996   FOOT NEUROMA SURGERY  ~ 2003   right   RE-EXCISION OF BREAST LUMPECTOMY Left 03/03/2020   Procedure: RE-EXCISION MARGINS OF LEFT BREAST LUMPECTOMY;  Surgeon: Donnie Mesa, MD;  Location: [place];  Service: General;  Laterality: Left;   ROTATOR CUFF REPAIR  ~ 2009   left   SHOULDER ARTHROSCOPY W/ ROTATOR CUFF REPAIR Right 09/21/2011   SKIN CANCER EXCISION     nose, forehead, left leg   TEE WITHOUT  CARDIOVERSION N/A 03/01/2017   Procedure: TRANSESOPHAGEAL ECHOCARDIOGRAM (TEE);  Surgeon: Skeet Latch, MD;  Location: [place];  Service: Cardiovascular;  Laterality: N/A;   THYROID LOBECTOMY  ~ 2006   right   TUBAL LIGATION  ~ [place]  ~ 2009   VESICOVAGINAL FISTULA CLOSURE W/ TAH       Family History  Problem Relation Age of Onset   Heart disease Mother    Heart failure Mother 21       CHF  Stroke Mother    Hypertension Mother    Heart disease Father 10   Macular degeneration Father    Heart disease Sister    Atrial fibrillation Sister    Heart disease Sister      Social History   Socioeconomic History   Marital status: Married    Spouse name: [name]   Number of children: 1   Years of education: MA   Highest education level: Not on file  Occupational History   Occupation: Pharmacist, hospital    Comment: n/a  Tobacco Use   Smoking status: Never    Passive exposure: Never   Smokeless tobacco: Never  Vaping Use   Vaping Use: Never used  Substance and Sexual Activity   Alcohol use: No   Drug use: No   Sexual activity: Yes  Other Topics Concern   Not on file  Social History Narrative   Patient lives at home with family.   Caffeine Use: 1-2 cups daily   Social Determinants of Health   Financial Resource Strain: Low Risk  (01/06/2020)   Overall Financial Resource Strain (CARDIA)    Difficulty of Paying Living Expenses: Not hard at all  Food Insecurity: No Food Insecurity (01/06/2020)   Hunger Vital Sign    Worried About Running Out of Food in the Last Year: Never true    Ran Out of Food in the Last Year: Never true  Transportation Needs: No Transportation Needs (01/06/2020)   PRAPARE - Hydrologist (Medical): No    Lack of Transportation (Non-Medical): No  Physical Activity: Not on file  Stress: Not on file  Social Connections: Not on file  Intimate Partner Violence: Not on file     BP 108/70   Pulse 70    Ht 5' 7.5" (1.715 m)   Wt 155 lb (70.3 kg)   BMI 23.92 kg/m   Physical Exam:  Well appearing NAD HEENT: Unremarkable Neck:  No JVD, no thyromegally Lymphatics:  No adenopathy Back:  No CVA tenderness Lungs:  Clear HEART:  Regular rate rhythm, no murmurs, no rubs, no clicks Abd:  soft, positive bowel sounds, no organomegally, no rebound, no guarding Ext:  2 plus pulses, no edema, no cyanosis, no clubbing Skin:  No rashes no nodules Neuro:  CN II through XII intact, motor grossly intact  DEVICE  Normal device function.  See PaceArt for details.   Assess/Plan:  1. Chronic systolic heart failure - her symptoms are well compensated class 2A. She will continue her current meds. 2. Biv PPM - her device has over 6 years of longevity. We will recheck. 3. Breast CA - she is s/p chemo/XRT 4. PAF - she is maintaining NSR very nicely. She will continue her systemic anti-coagulation.  Carleene Overlie Arnell Mausolf,MD

## 2021-11-08 NOTE — Patient Instructions (Signed)
Medication Instructions:  Your physician recommends that you continue on your current medications as directed. Please refer to the Current Medication list given to you today.  *If you need a refill on your cardiac medications before your next appointment, please call your pharmacy*  Lab Work: None ordered.  If you have labs (blood work) drawn today and your tests are completely normal, you will receive your results only by: [place] (if you have MyChart) OR A paper copy in the mail If you have any lab test that is abnormal or we need to change your treatment, we will call you to review the results.  Testing/Procedures: None ordered.  Follow-Up: At [facility] [address], you and your health needs are our priority.  As part of our continuing mission to provide you with exceptional heart care, we have created designated Provider Care Teams.  These Care Teams include your primary Cardiologist (physician) and Advanced Practice Providers (APPs -  Physician Assistants and Nurse Practitioners) who all work together to provide you with the care you need, when you need it.  We recommend signing up for the patient portal called "MyChart".  Sign up information is provided on this After Visit Summary.  MyChart is used to connect with patients for Virtual Visits (Telemedicine).  Patients are able to view lab/test results, encounter notes, upcoming appointments, etc.  Non-urgent messages can be sent to your provider as well.   To learn more about what you can do with MyChart, go to [email or web address].    Your next appointment:   1 year(s)  The format for your next appointment:   In Person  Provider:   Cristopher Peru, MD{or one of the following Advanced Practice Providers on your designated Care Team:   Tommye Standard, [place] Legrand Como "Jonni Sanger" Chalmers Cater, [place]  Remote monitoring is used to monitor your Pacemaker/ ICD from home. This monitoring reduces the number of office visits required to check your  device to one time per year. It allows [place] to keep an eye on the functioning of your device to ensure it is working properly. You are scheduled for a device check from home on 11/23/21. You may send your transmission at any time that day. If you have a wireless device, the transmission will be sent automatically. After your physician reviews your transmission, you will receive a postcard with your next transmission date.  Important Information About Sugar

## 2021-11-20 ENCOUNTER — Other Ambulatory Visit: Payer: Self-pay | Admitting: Cardiology

## 2021-11-21 DIAGNOSIS — R5383 Other fatigue: Secondary | ICD-10-CM | POA: Diagnosis not present

## 2021-11-21 DIAGNOSIS — Z6824 Body mass index (BMI) 24.0-24.9, adult: Secondary | ICD-10-CM | POA: Diagnosis not present

## 2021-11-21 DIAGNOSIS — D539 Nutritional anemia, unspecified: Secondary | ICD-10-CM | POA: Diagnosis not present

## 2021-11-21 DIAGNOSIS — E559 Vitamin D deficiency, unspecified: Secondary | ICD-10-CM | POA: Diagnosis not present

## 2021-11-21 DIAGNOSIS — Z0130 Encounter for examination of blood pressure without abnormal findings: Secondary | ICD-10-CM | POA: Diagnosis not present

## 2021-11-21 DIAGNOSIS — I509 Heart failure, unspecified: Secondary | ICD-10-CM | POA: Diagnosis not present

## 2021-11-21 DIAGNOSIS — E7800 Pure hypercholesterolemia, unspecified: Secondary | ICD-10-CM | POA: Diagnosis not present

## 2021-11-21 DIAGNOSIS — E039 Hypothyroidism, unspecified: Secondary | ICD-10-CM | POA: Diagnosis not present

## 2021-11-21 DIAGNOSIS — Z131 Encounter for screening for diabetes mellitus: Secondary | ICD-10-CM | POA: Diagnosis not present

## 2021-11-21 DIAGNOSIS — Z79899 Other long term (current) drug therapy: Secondary | ICD-10-CM | POA: Diagnosis not present

## 2021-11-22 NOTE — Telephone Encounter (Signed)
Rx(s) sent to pharmacy electronically.  

## 2021-11-23 ENCOUNTER — Ambulatory Visit (INDEPENDENT_AMBULATORY_CARE_PROVIDER_SITE_OTHER): Payer: Medicare PPO

## 2021-11-23 DIAGNOSIS — I428 Other cardiomyopathies: Secondary | ICD-10-CM | POA: Diagnosis not present

## 2021-11-23 LAB — CUP PACEART REMOTE DEVICE CHECK
Battery Remaining Longevity: 50 mo
Battery Voltage: 2.95 V
Brady Statistic AP VP Percent: 0.06 %
Brady Statistic AP VS Percent: 0.01 %
Brady Statistic AS VP Percent: 98.22 %
Brady Statistic AS VS Percent: 1.71 %
Brady Statistic RA Percent Paced: 0.08 %
Brady Statistic RV Percent Paced: 5.88 %
Date Time Interrogation Session: 20231113212029
Implantable Lead Connection Status: 753985
Implantable Lead Connection Status: 753985
Implantable Lead Connection Status: 753985
Implantable Lead Implant Date: 20130110
Implantable Lead Implant Date: 20130110
Implantable Lead Implant Date: 20130110
Implantable Lead Location: 753858
Implantable Lead Location: 753859
Implantable Lead Location: 753860
Implantable Lead Model: 4196
Implantable Lead Model: 5076
Implantable Lead Model: 6935
Implantable Pulse Generator Implant Date: 20181115
Lead Channel Impedance Value: 1197 Ohm
Lead Channel Impedance Value: 323 Ohm
Lead Channel Impedance Value: 399 Ohm
Lead Channel Impedance Value: 589 Ohm
Lead Channel Impedance Value: 703 Ohm
Lead Channel Impedance Value: 703 Ohm
Lead Channel Impedance Value: 779 Ohm
Lead Channel Impedance Value: 817 Ohm
Lead Channel Impedance Value: 817 Ohm
Lead Channel Pacing Threshold Amplitude: 0.625 V
Lead Channel Pacing Threshold Amplitude: 1.625 V
Lead Channel Pacing Threshold Amplitude: 2.5 V
Lead Channel Pacing Threshold Pulse Width: 0.4 ms
Lead Channel Pacing Threshold Pulse Width: 0.4 ms
Lead Channel Pacing Threshold Pulse Width: 0.8 ms
Lead Channel Sensing Intrinsic Amplitude: 0.5 mV
Lead Channel Sensing Intrinsic Amplitude: 0.5 mV
Lead Channel Sensing Intrinsic Amplitude: 18.375 mV
Lead Channel Sensing Intrinsic Amplitude: 18.375 mV
Lead Channel Setting Pacing Amplitude: 2 V
Lead Channel Setting Pacing Amplitude: 3 V
Lead Channel Setting Pacing Amplitude: 4 V
Lead Channel Setting Pacing Pulse Width: 0.8 ms
Lead Channel Setting Pacing Pulse Width: 1 ms
Lead Channel Setting Sensing Sensitivity: 0.9 mV
Zone Setting Status: 755011
Zone Setting Status: 755011

## 2021-12-06 ENCOUNTER — Ambulatory Visit (HOSPITAL_BASED_OUTPATIENT_CLINIC_OR_DEPARTMENT_OTHER): Payer: Medicare PPO | Admitting: Cardiology

## 2021-12-08 DIAGNOSIS — R030 Elevated blood-pressure reading, without diagnosis of hypertension: Secondary | ICD-10-CM | POA: Diagnosis not present

## 2021-12-08 DIAGNOSIS — E538 Deficiency of other specified B group vitamins: Secondary | ICD-10-CM | POA: Diagnosis not present

## 2021-12-08 DIAGNOSIS — R2681 Unsteadiness on feet: Secondary | ICD-10-CM | POA: Diagnosis not present

## 2021-12-08 DIAGNOSIS — I509 Heart failure, unspecified: Secondary | ICD-10-CM | POA: Diagnosis not present

## 2021-12-08 DIAGNOSIS — R300 Dysuria: Secondary | ICD-10-CM | POA: Diagnosis not present

## 2021-12-08 DIAGNOSIS — E039 Hypothyroidism, unspecified: Secondary | ICD-10-CM | POA: Diagnosis not present

## 2021-12-08 DIAGNOSIS — Z0130 Encounter for examination of blood pressure without abnormal findings: Secondary | ICD-10-CM | POA: Diagnosis not present

## 2021-12-08 DIAGNOSIS — Z6824 Body mass index (BMI) 24.0-24.9, adult: Secondary | ICD-10-CM | POA: Diagnosis not present

## 2021-12-08 DIAGNOSIS — M6281 Muscle weakness (generalized): Secondary | ICD-10-CM | POA: Diagnosis not present

## 2021-12-09 DIAGNOSIS — M5442 Lumbago with sciatica, left side: Secondary | ICD-10-CM | POA: Diagnosis not present

## 2021-12-09 DIAGNOSIS — M5441 Lumbago with sciatica, right side: Secondary | ICD-10-CM | POA: Diagnosis not present

## 2021-12-09 DIAGNOSIS — M542 Cervicalgia: Secondary | ICD-10-CM | POA: Diagnosis not present

## 2021-12-16 DIAGNOSIS — Z1212 Encounter for screening for malignant neoplasm of rectum: Secondary | ICD-10-CM | POA: Diagnosis not present

## 2021-12-16 DIAGNOSIS — Z1211 Encounter for screening for malignant neoplasm of colon: Secondary | ICD-10-CM | POA: Diagnosis not present

## 2021-12-21 NOTE — Progress Notes (Signed)
Remote pacemaker transmission.   

## 2021-12-23 LAB — COLOGUARD: COLOGUARD: POSITIVE — AB

## 2021-12-24 DIAGNOSIS — Z853 Personal history of malignant neoplasm of breast: Secondary | ICD-10-CM | POA: Diagnosis not present

## 2021-12-24 DIAGNOSIS — R922 Inconclusive mammogram: Secondary | ICD-10-CM | POA: Diagnosis not present

## 2021-12-24 LAB — HM DEXA SCAN

## 2021-12-30 ENCOUNTER — Other Ambulatory Visit (HOSPITAL_BASED_OUTPATIENT_CLINIC_OR_DEPARTMENT_OTHER): Payer: Self-pay | Admitting: Obstetrics & Gynecology

## 2021-12-30 DIAGNOSIS — N390 Urinary tract infection, site not specified: Secondary | ICD-10-CM

## 2022-01-05 NOTE — Telephone Encounter (Signed)
Pt states that she is no longer using this medication

## 2022-01-14 DIAGNOSIS — M6281 Muscle weakness (generalized): Secondary | ICD-10-CM | POA: Diagnosis not present

## 2022-01-14 DIAGNOSIS — Z7901 Long term (current) use of anticoagulants: Secondary | ICD-10-CM | POA: Diagnosis not present

## 2022-01-14 DIAGNOSIS — R2681 Unsteadiness on feet: Secondary | ICD-10-CM | POA: Diagnosis not present

## 2022-01-14 DIAGNOSIS — E039 Hypothyroidism, unspecified: Secondary | ICD-10-CM | POA: Diagnosis not present

## 2022-01-14 DIAGNOSIS — E538 Deficiency of other specified B group vitamins: Secondary | ICD-10-CM | POA: Diagnosis not present

## 2022-01-14 DIAGNOSIS — Z0130 Encounter for examination of blood pressure without abnormal findings: Secondary | ICD-10-CM | POA: Diagnosis not present

## 2022-01-14 DIAGNOSIS — L089 Local infection of the skin and subcutaneous tissue, unspecified: Secondary | ICD-10-CM | POA: Diagnosis not present

## 2022-01-14 DIAGNOSIS — S80811A Abrasion, right lower leg, initial encounter: Secondary | ICD-10-CM | POA: Diagnosis not present

## 2022-01-14 DIAGNOSIS — Z6824 Body mass index (BMI) 24.0-24.9, adult: Secondary | ICD-10-CM | POA: Diagnosis not present

## 2022-01-28 IMAGING — MR MR CERVICAL SPINE W/O CM
4 of 6 series · 19 of 48 positions shown · non-contrast
Comparison: 01/06/2017 cervical spine CT

CLINICAL DATA: Chronic cervical spine pain

EXAM:
MRI CERVICAL SPINE WITHOUT CONTRAST
TECHNIQUE: Multiplanar, multisequence MR imaging of the cervical spine was
performed. No intravenous contrast was administered.

[Series 3: T2 · sagittal · 3.0mm · 0.43mm/px · 6 of 16 slices shown (1 of 3)]
[im 1/16]
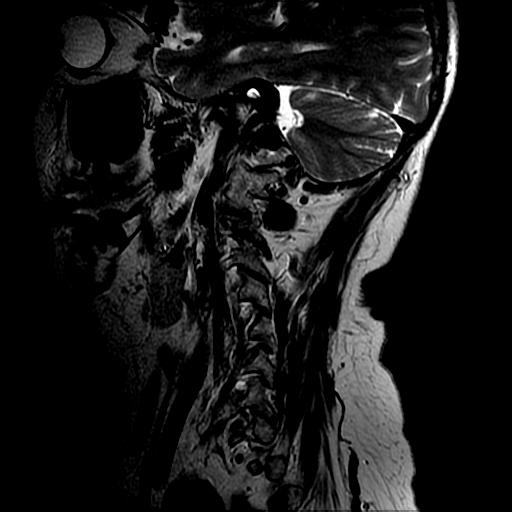
[im 4/16]
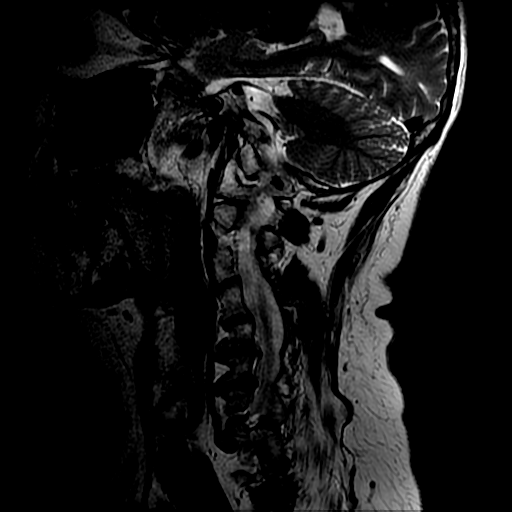
[im 7/16]
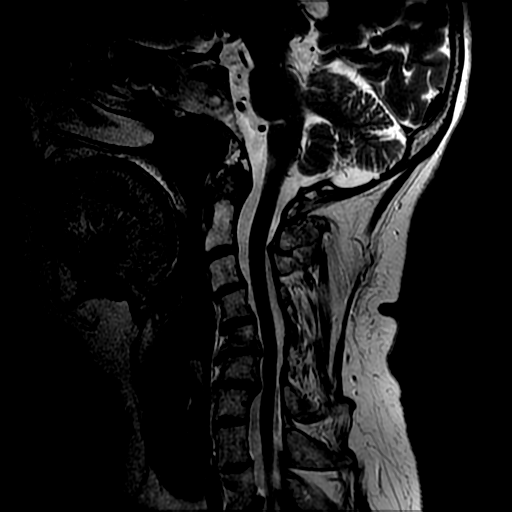
[im 10/16]
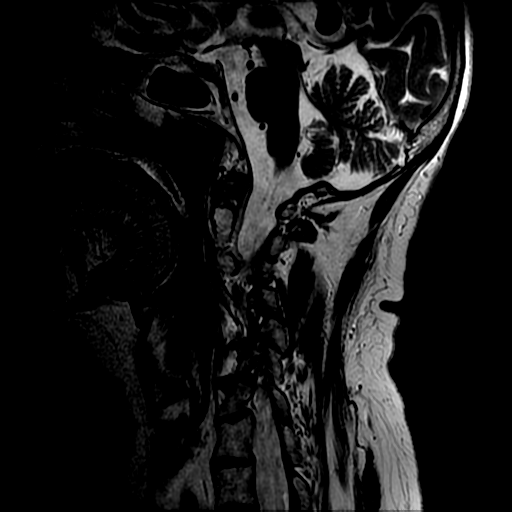
[im 13/16]
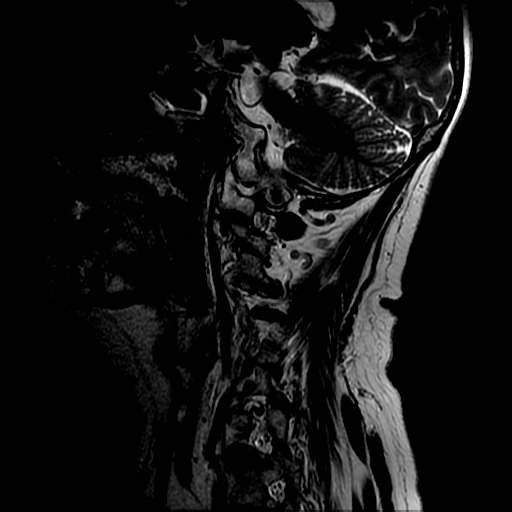
[im 16/16]
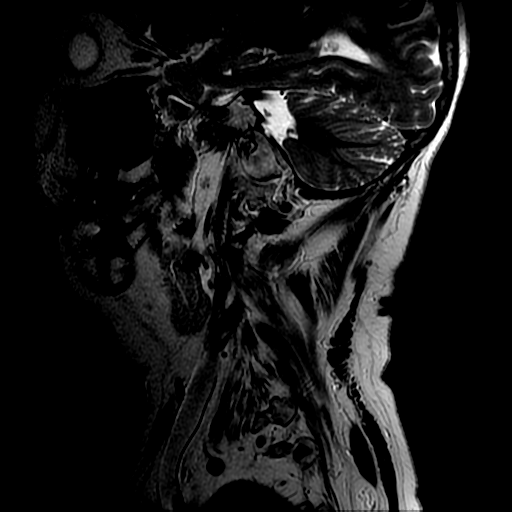

[Series 5: T1 · sagittal · 3.0mm · 0.43mm/px · 3 of 16 slices shown]
[im 4/16]
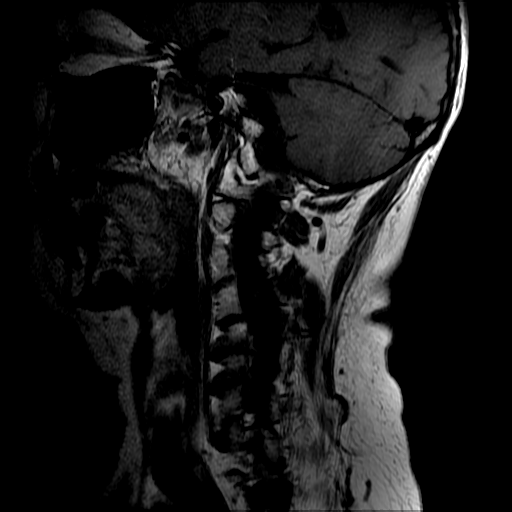
[im 10/16]
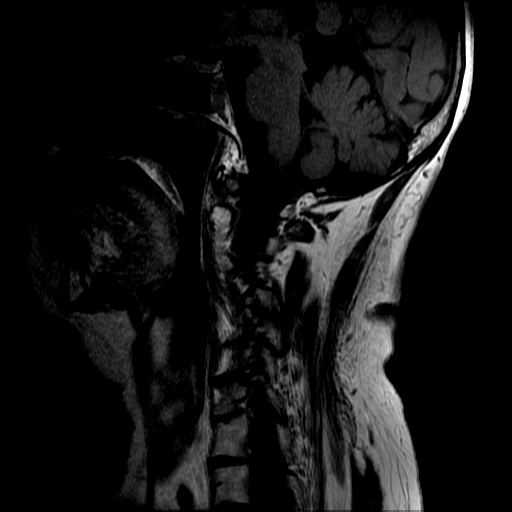
[im 16/16]
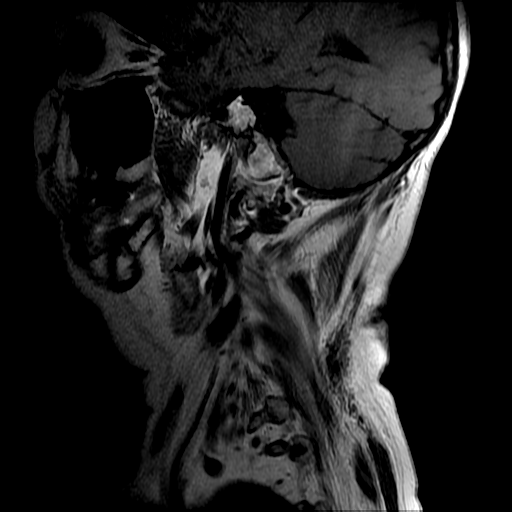

[Series 7: T2 · axial · 3.0mm · 0.39mm/px · z∈[-64,+12]mm · 7 of 27 slices shown (2 of 3)]
[im 1/27]
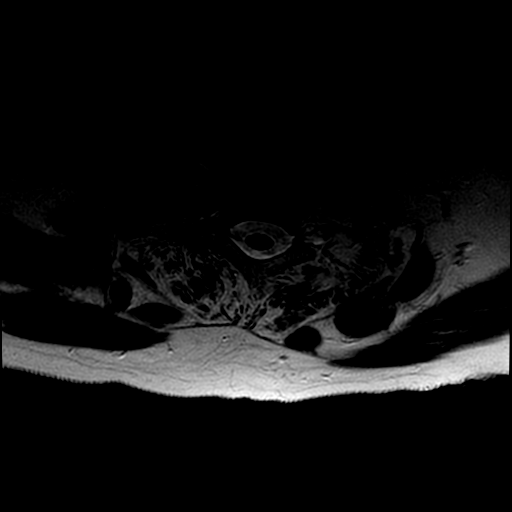
[im 3/27]
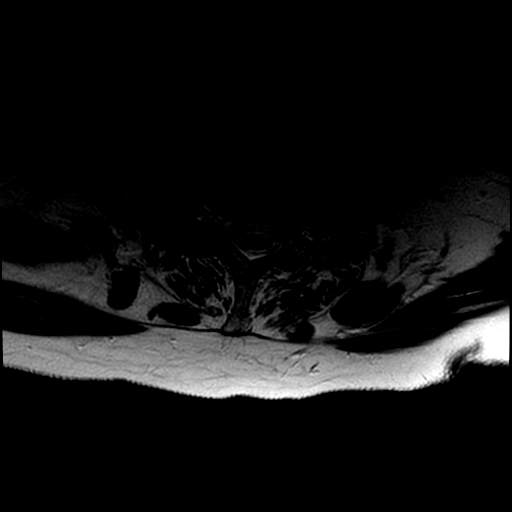
[im 6/27]
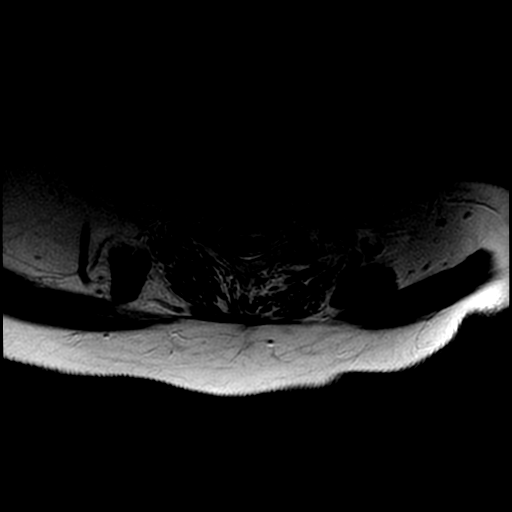
[im 8/27]
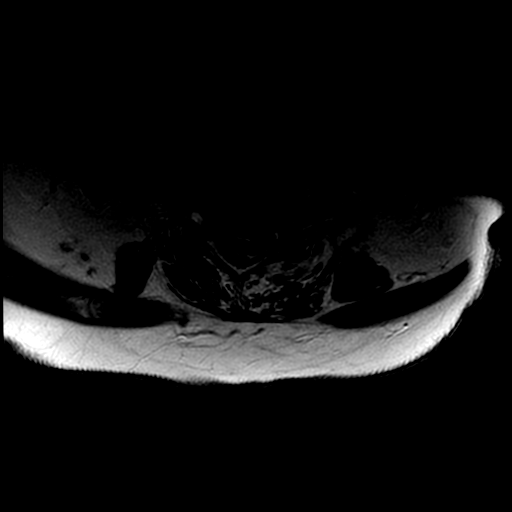
[im 11/27]
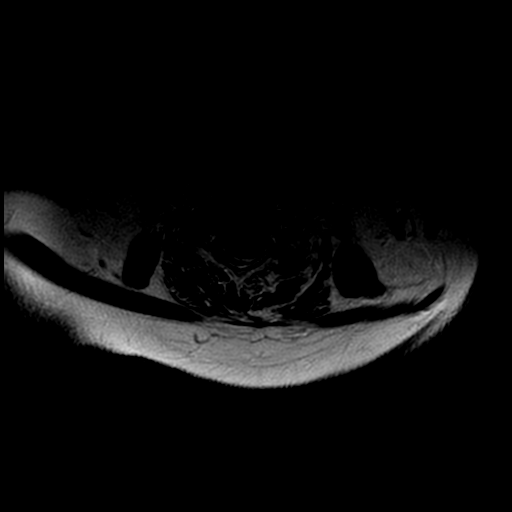
[im 14/27]
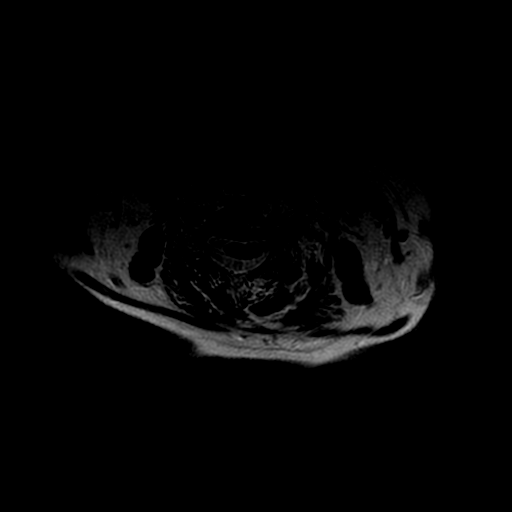
[im 24/27]
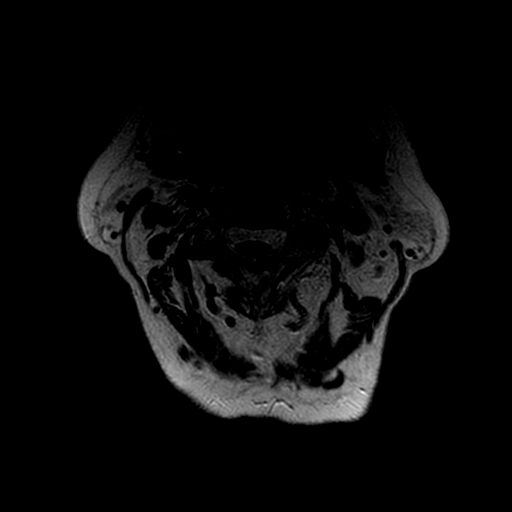

[Series 8: T2 · coronal · 3.0mm · 0.43mm/px · 3 of 19 slices shown (3 of 3)]
[im 3/19]
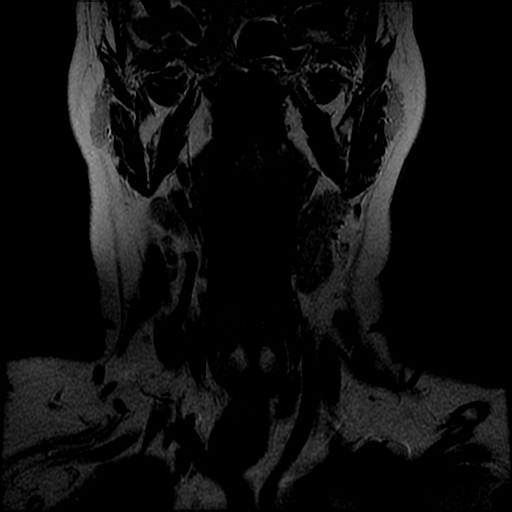
[im 11/19]
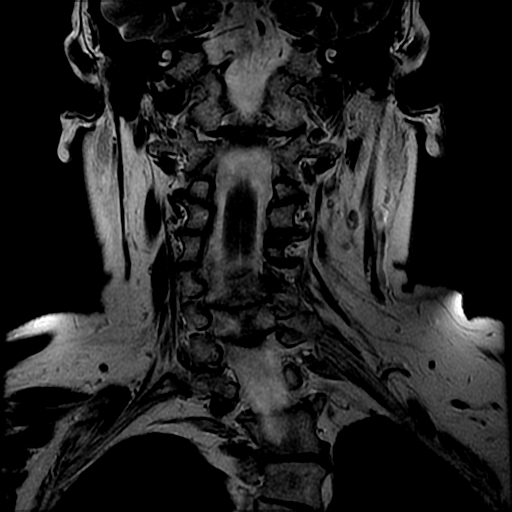
[im 16/19]
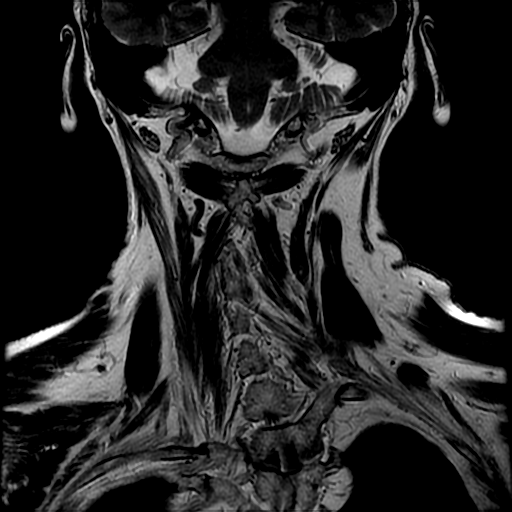

[19 of 48 positions shown; findings below may reference images not displayed]

FINDINGS: Alignment: Degenerative reversal of cervical lordosis with
anterolisthesis at C3-4 and C4-5 measuring up to 2 mm at C4-5. Mild
retrolisthesis at C5-6 and C6-7. Compensatory dextroscoliosis.

Vertebrae: No fracture, evidence of discitis, or bone lesion.

Cord: Normal signal and morphology.

Posterior Fossa, vertebral arteries, paraspinal tissues: Chronic
sphenoid sinusitis with sclerotic wall thickening.

Disc levels:

C2-3: Mild facet spurring.  No impingement

C3-4: Degenerative facet spurring asymmetric to the left. Mild
anterolisthesis. No impingement

C4-5: Degenerative facet spurring on the left with mild
anterolisthesis. Mild disc narrowing and left uncovertebral
spurring. Mild left foraminal narrowing

C5-6: Disc narrowing with endplate and uncovertebral ridging.
Moderate to advanced bilateral foraminal narrowing. Patent spinal
canal

C6-7: Disc narrowing with endplate and uncovertebral ridging.
Mild-to-moderate bilateral foraminal narrowing with patent spinal
canal

C7-T1:Unremarkable.

T2-3: Severe right facet arthritis with foraminal impingement from
facet spurring.
IMPRESSION: 1. Cervical kyphoscoliosis and multilevel listhesis.
2. C5-6 moderate biforaminal impingement similar to 1718. C6-7
mild-to-moderate foraminal narrowing without interval progression
3. Diffusely patent spinal canal.

## 2022-02-04 ENCOUNTER — Inpatient Hospital Stay: Payer: Medicare PPO | Attending: Oncology | Admitting: Oncology

## 2022-02-04 VITALS — BP 125/72 | HR 95 | Temp 98.1°F | Resp 18 | Ht 67.0 in | Wt 160.6 lb

## 2022-02-04 DIAGNOSIS — Z923 Personal history of irradiation: Secondary | ICD-10-CM | POA: Diagnosis not present

## 2022-02-04 DIAGNOSIS — Z79811 Long term (current) use of aromatase inhibitors: Secondary | ICD-10-CM | POA: Insufficient documentation

## 2022-02-04 DIAGNOSIS — D0512 Intraductal carcinoma in situ of left breast: Secondary | ICD-10-CM | POA: Diagnosis not present

## 2022-02-04 DIAGNOSIS — I509 Heart failure, unspecified: Secondary | ICD-10-CM | POA: Diagnosis not present

## 2022-02-04 DIAGNOSIS — Z7901 Long term (current) use of anticoagulants: Secondary | ICD-10-CM | POA: Diagnosis not present

## 2022-02-04 DIAGNOSIS — E039 Hypothyroidism, unspecified: Secondary | ICD-10-CM | POA: Diagnosis not present

## 2022-02-04 DIAGNOSIS — I480 Paroxysmal atrial fibrillation: Secondary | ICD-10-CM | POA: Insufficient documentation

## 2022-02-04 DIAGNOSIS — G629 Polyneuropathy, unspecified: Secondary | ICD-10-CM | POA: Diagnosis not present

## 2022-02-04 NOTE — Progress Notes (Signed)
[facility] OFFICE PROGRESS NOTE   Diagnosis: DCIS  INTERVAL HISTORY:   [name] was diagnosed with left breast DCIS after she was noted to have calcifications on the mammogram in November 2021.  She underwent a left lumpectomy which confirmed high-grade DCIS.  She completed adjuvant radiation and began anastrozole on 05/10/2020.  She reports mild hot flashes.  She bruises easily.  No other complaint.  She reports a negative mammogram at [facility] in December 2023.  She lives in [place] and would like to continue breast cancer follow-up at the [facility].   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  Paroxysmal atrial fibrillation on apixaban 5.  Hypertension 6.  Biventricular pacemaker 7.  History of a splenic infarction  Past surgical history: Bilateral rotator cuff repair Left breast lumpectomy Hysterectomy Vesicovaginal fistula closure Cholecystectomy Back surgery  Social history: She lives with her husband in [place].  She is a retired Automotive engineer.  She does not use cigarettes or alcohol.  No transfusion history.  No risk factor for HIV or hepatitis.  Review of systems: Positives-occasional hot flashes, occasional dysphagia, dry mouth, nausea when she has a migraine headache  A complete review of systems was otherwise negative  Objective:  Vital signs in last 24 hours:  Blood pressure 125/72, pulse 95, temperature 98.1 F (36.7 C), temperature source Oral, resp. rate 18, height '5\' 7"'$  (1.702 m), weight 160 lb 9.6 oz (72.8 kg), SpO2 97 %.    HEENT: Neck  without mass Lymphatics: No cervical, supraclavicular, or axillary nodes Resp: Lungs clear bilaterally Cardio: Regular rate and rhythm GI: No hepatosplenomegaly Vascular: No leg edema Breast: Status post left lumpectomy.  No evidence for local tumor recurrence.  No mass in either breast.  Both axillae appear benign.  Inverted right nipple, chronic per patient Skin: Ecchymoses over the arms and lower legs   Lab Results:  Lab Results  Component Value Date   WBC 13.2 (H) 09/06/2021   HGB 13.9 09/06/2021   HCT 42.4 09/06/2021   MCV 96.6 09/06/2021   PLT 256 09/06/2021   NEUTROABS 10.4 (H) 09/06/2021    CMP  Lab Results  Component Value Date   NA 142 09/06/2021   K 3.4 (L) 09/06/2021   CL 107 09/06/2021   CO2 26 09/06/2021   GLUCOSE 102 (H) 09/06/2021   BUN 18 09/06/2021   CREATININE 0.73 09/06/2021   CALCIUM 8.6 (L) 09/06/2021   PROT 7.0 02/04/2021   ALBUMIN 4.0 02/04/2021   AST 28 02/04/2021   ALT 25 02/04/2021   ALKPHOS 78 02/04/2021   BILITOT 0.4 02/04/2021   GFRNONAA >60 09/06/2021   GFRAA >60 02/27/2017    No results found for: "CEA1", "CEA", "FGH829", "CA125"  Lab Results  Component Value Date   INR 0.9 01/17/2011   LABPROT 10.3 01/17/2011    Medications: I have reviewed the patient's current medications.   Assessment/Plan: Left breast DCIS-left lumpectomy 01/22/2020 high-grade DCIS, 1.6 cm, DCIS probably less than 1 cm from the medial, superior, and inferior margin, focally less than 1 mm from the anterior margin Reexcision 03/03/2020-focal residual DCIS 2 mm from the superior/lateral margin 4 mm from the  inferior margin Adjuvant radiation 04/07/2020 - 05/01/2020 Anastrozole 05/10/2020  2.  Paroxysmal atrial fibrillation-apixaban 3.  History of a splenic infarct 4.  History of migraine headaches 5.  "Abnormal Cologuard "-scheduled for a colonoscopy at [facility] gastroenterology in February 6.  Neuropathy 7.  CHF 8.  Hypothyroidism   Disposition: Ms.  Schmidt was diagnosed with left breast DCIS in January 2022.  She completed adjuvant radiation and started anastrozole on 05/10/2020.  She remains in clinical remission and is tolerating the anastrozole well.  Will follow-up on the mammogram from December 2023.  She will continue yearly mammography.  DanaDerflinger will return for an office visit in 1 year.  She continues apixaban anticoagulation in the setting of paroxysmal atrial fibrillation and history of a splenic infarct.  She will consult with cardiology regarding the indication for continuing anticoagulation therapy.  Betsy Coder, MD  02/04/2022  12:05 PM

## 2022-02-06 ENCOUNTER — Encounter (HOSPITAL_BASED_OUTPATIENT_CLINIC_OR_DEPARTMENT_OTHER): Payer: Self-pay

## 2022-02-09 ENCOUNTER — Encounter: Payer: Self-pay | Admitting: *Deleted

## 2022-02-09 ENCOUNTER — Telehealth: Payer: Self-pay | Admitting: *Deleted

## 2022-02-09 NOTE — Patient Outreach (Signed)
  Care Coordination   Initial Visit Note   02/09/2022 Name: [name] MRN: [id number] DOB: 04/03/1955  [name] is a 67 y.o. year old female who sees Tilda Franco, FNP for primary care. I spoke with  Ida F Berringer by phone today.  What matters to the patients health and wellness today?  No needs    Goals Addressed             This Visit's Progress    COMPLETED: care coordination activity       Care Coordination Interventions: Reviewed medications with patient and discussed adherence with all medications. Reviewed scheduled/upcoming provider appointments including sufficient transportation source Assessed social determinant of health barriers Educated on care management services with no acute needs presented at this time.          SDOH assessments and interventions completed:  Yes  SDOH Interventions Today    Flowsheet Row Most Recent Value  SDOH Interventions   Food Insecurity Interventions Intervention Not Indicated  Housing Interventions Intervention Not Indicated  Transportation Interventions Intervention Not Indicated  Utilities Interventions Intervention Not Indicated        Care Coordination Interventions:  Yes, provided   Follow up plan: No further intervention required.   Encounter Outcome:  Pt. Visit Completed   Raina Mina, RN Care Management Coordinator [facility] Office [phone]

## 2022-02-09 NOTE — Patient Instructions (Signed)
Visit Information  Thank you for taking time to visit with me today. Please don't hesitate to contact me if I can be of assistance to you.   Following are the goals we discussed today:   Goals Addressed             This Visit's Progress    COMPLETED: care coordination activity       Care Coordination Interventions: Reviewed medications with patient and discussed adherence with all medications. Reviewed scheduled/upcoming provider appointments including sufficient transportation source Assessed social determinant of health barriers Educated on care management services with no acute needs presented at this time.          Please call the care guide team at [phone] if you need to cancel or reschedule your appointment.   If you are experiencing a Mental Health or [place] or need someone to talk to, please call the Suicide and Crisis Lifeline: 988  Patient verbalizes understanding of instructions and care plan provided today and agrees to view in [facility]. Active MyChart status and patient understanding of how to access instructions and care plan via MyChart confirmed with patient.     No further follow up required: No needs

## 2022-02-14 ENCOUNTER — Ambulatory Visit (INDEPENDENT_AMBULATORY_CARE_PROVIDER_SITE_OTHER): Payer: Medicare PPO | Admitting: Obstetrics & Gynecology

## 2022-02-14 ENCOUNTER — Encounter (HOSPITAL_BASED_OUTPATIENT_CLINIC_OR_DEPARTMENT_OTHER): Payer: Self-pay | Admitting: Obstetrics & Gynecology

## 2022-02-14 VITALS — BP 104/60 | HR 83 | Ht 67.5 in | Wt 161.4 lb

## 2022-02-14 DIAGNOSIS — N952 Postmenopausal atrophic vaginitis: Secondary | ICD-10-CM

## 2022-02-14 DIAGNOSIS — Z9189 Other specified personal risk factors, not elsewhere classified: Secondary | ICD-10-CM | POA: Diagnosis not present

## 2022-02-14 DIAGNOSIS — C50912 Malignant neoplasm of unspecified site of left female breast: Secondary | ICD-10-CM

## 2022-02-14 DIAGNOSIS — I480 Paroxysmal atrial fibrillation: Secondary | ICD-10-CM

## 2022-02-14 DIAGNOSIS — M810 Age-related osteoporosis without current pathological fracture: Secondary | ICD-10-CM

## 2022-02-14 DIAGNOSIS — Z170 Estrogen receptor positive status [ER+]: Secondary | ICD-10-CM | POA: Diagnosis not present

## 2022-02-14 DIAGNOSIS — N390 Urinary tract infection, site not specified: Secondary | ICD-10-CM | POA: Diagnosis not present

## 2022-02-14 NOTE — Progress Notes (Unsigned)
67 y.o. G0P0000 Married White or Caucasian female here for breast and pelvic exam.  I am also following her for history of breast cancer.  She is on arimidex.  She is followed by Dr. Lindi Adie.  Reports she did have her mammogram in December.  Will contact [facility] for this as she did put my name as one of her doctors.    H/o osteoporosis.  Reclast recommended with Dr. Lindi Adie at appt in January.    H/o recurrent UTIs.  Hasn't had one in about a year.  Stopped macrobid in October.   Denies vaginal bleeding.  No LMP recorded. Patient has had a hysterectomy.          Sexually active: occasionally H/O STD:  no  Health Maintenance: PCP:  Basilio Cairo, FNP at [facility] on [address].  Last wellness appt was 01/2022.  Did blood work at that appt:  yes Vaccines are up to date:  needs updated Covid vaccine Colonoscopy:  cologuard was positive.  Appt is scheduled for next month.  Dr. Trula Slade MMG:  12/2021 BMD:  12/23/2020 Last pap smear:  hysterectomy in 2009.        reports that she has never smoked. She has never been exposed to tobacco smoke. She has never used smokeless tobacco. She reports that she does not drink alcohol and does not use drugs.  Past Medical History:  Diagnosis Date   AF (paroxysmal atrial fibrillation) (HCC)    Arthritis    BCC (basal cell carcinoma) 03/11/2003   left distal lower leg ankle-tx dr Tonia Brooms   Biventricular cardiac pacemaker in situ    a. prior CRT-D in 2013 with downgrade to CRT-Pacemaker only in 11/2016.   Breast cancer ([facility]) 40/98/1191   Chronic systolic CHF (congestive heart failure) (HCC)    Complication of anesthesia    aspiration   Essential tremor    GERD (gastroesophageal reflux disease)    otc   Hypertension    Hypothyroidism    LBBB (left bundle branch block)    conduction disorder   Migraines    Mild aortic insufficiency    NICM (nonischemic cardiomyopathy) ([facility])    a. 2012: normal coronaries, EF 25-30%. b. improved to 55% 11/2016.    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   in situ- right lower leg-anterior (CX35FU)   Scoliosis     Past Surgical History:  Procedure Laterality Date   ARTERY BIOPSY  12/29/2011   Procedure: MINOR BIOPSY TEMPORAL ARTERY;  Surgeon: Edward Jolly, MD;  Location: [facility];  Service: General;  Laterality: Right;  Right temporal artery biopsy   BACK SURGERY  12/31/2009   "for flat back syndrome; fused bottom of my back"   BI-VENTRICULAR IMPLANTABLE CARDIOVERTER DEFIBRILLATOR Left 01/20/2011   Procedure: BI-VENTRICULAR IMPLANTABLE CARDIOVERTER DEFIBRILLATOR  (CRT-D);  Surgeon: Evans Lance, MD;  Location: [facility] CATH LAB;  Service: Cardiovascular;  Laterality: Left;   BIV ICD GENERATOR CHANGEOUT N/A 11/24/2016   Procedure: BIV ICD GENERATOR CHANGEOUT;  Surgeon: Evans Lance, MD;  Location: [facility] CV LAB;  Service: Cardiovascular;  Laterality: N/A;   BREAST LUMPECTOMY WITH RADIOACTIVE SEED LOCALIZATION Left 01/22/2020   Procedure: LEFT BREAST LUMPECTOMY WITH RADIOACTIVE SEED LOCALIZATION;  Surgeon: Donnie Mesa, MD;  Location: [facility];  Service: General;  Laterality: Left;  LMA   CHOLECYSTECTOMY  ~1996   FOOT NEUROMA SURGERY  ~ 2003   right   RE-EXCISION OF BREAST LUMPECTOMY Left 03/03/2020   Procedure: RE-EXCISION MARGINS OF LEFT BREAST LUMPECTOMY;  Surgeon: Donnie Mesa, MD;  Location: [facility];  Service: General;  Laterality: Left;   ROTATOR CUFF REPAIR  ~ 2009   left   SHOULDER ARTHROSCOPY W/ ROTATOR CUFF REPAIR Right 09/21/2011   SKIN CANCER EXCISION     nose, forehead, left leg   TEE WITHOUT CARDIOVERSION N/A 03/01/2017   Procedure: TRANSESOPHAGEAL ECHOCARDIOGRAM (TEE);  Surgeon: Skeet Latch, MD;   Location: [facility];  Service: Cardiovascular;  Laterality: N/A;   THYROID LOBECTOMY  ~ 2006   right   TUBAL LIGATION  ~ [place]  ~ 2009   VESICOVAGINAL FISTULA CLOSURE W/ TAH      Current Outpatient Medications  Medication Sig Dispense Refill   acetaminophen (TYLENOL) 325 MG tablet Take 650 mg by mouth as needed.     ALPRAZolam (XANAX) 0.5 MG tablet Take 0.5 mg by mouth 3 (three) times daily as needed for anxiety.     amitriptyline (ELAVIL) 25 MG tablet Take '25mg'$  by mouth every night  5   anastrozole (ARIMIDEX) 1 MG tablet Take 1 tablet (1 mg total) by mouth daily. 90 tablet 3   apixaban (ELIQUIS) 5 MG TABS tablet TAKE 1 TABLET BY MOUTH TWICE A DAY 180 tablet 1   CALCIUM PO Take 2 tablets by mouth daily.     cetirizine (ZYRTEC) 10 MG tablet Take 10 mg by mouth daily.     Cyanocobalamin 1000 MCG/ML KIT Inject 1,000 mcg every 30 (thirty) days as directed.     eletriptan (RELPAX) 40 MG tablet Take 40 mg by mouth daily as needed for migraine. May repeat in 2 hours if necessary     famotidine (PEPCID) 20 MG tablet Take 20 mg by mouth daily as needed for heartburn or indigestion.     gabapentin (NEURONTIN) 600 MG tablet Take 600 mg by mouth 2 (two) times daily.     ketoconazole (NIZORAL) 2 % cream Apply to corner of mouth nightly 15 g 0   levothyroxine (SYNTHROID, LEVOTHROID) 100 MCG tablet Take 100 mcg daily before breakfast by mouth.     Melatonin 5 MG CAPS Take 5 mg by mouth at bedtime.     Menthol (BIOFREEZE) 10 % AERO      Omega-3 Fatty Acids (FISH OIL) 1000 MG CAPS 1 capsule     polyethylene glycol (MIRALAX / GLYCOLAX) packet Take 17 g by mouth every evening.     propranolol (INDERAL) 60 MG tablet Take 1 tablet (60 mg total) by mouth daily. 90 tablet 3   spironolactone (ALDACTONE) 25 MG tablet TAKE 1/2 TABLET BY MOUTH EVERY DAY 45 tablet 2   Varenicline Tartrate (TYRVAYA) 0.03 MG/ACT SOLN Place into the nose.     VITAMIN D PO Take 2 capsules by mouth daily.      No current facility-administered medications for this visit.    Family History  Problem Relation Age of Onset   Heart disease Mother    Heart failure Mother 2       CHF   Stroke Mother    Hypertension Mother    Heart disease Father 74   Macular degeneration Father    Heart disease Sister    Atrial fibrillation Sister    Heart disease Sister  Review of Systems  Exam:   BP 104/60 (BP Location: Left Arm, Patient Position: Sitting, Cuff Size: Large)   Pulse 83   Ht 5' 7.5" (1.715 m)   Wt 161 lb 6.4 oz (73.2 kg)   BMI 24.91 kg/m   Height: 5' 7.5" (171.5 cm)  General appearance: alert, cooperative and appears stated age Breasts: {Exam; breast:13139::"normal appearance, no masses or tenderness"} Abdomen: soft, non-tender; bowel sounds normal; no masses,  no organomegaly Lymph nodes: Cervical, supraclavicular, and axillary nodes normal.  No abnormal inguinal nodes palpated Neurologic: Grossly normal  Pelvic: External genitalia:  no lesions              Urethra:  normal appearing urethra with no masses, tenderness or lesions              Bartholins and Skenes: normal                 Vagina: normal appearing vagina with atrophic changes and no discharge, no lesions              Cervix: absent              Pap taken: No. Bimanual Exam:  Uterus:  uterus absent              Adnexa: normal adnexa               Rectovaginal: Confirms               Anus:  normal sphincter tone, no lesions  Chaperone, Octaviano Batty, CMA, was present for exam.  Assessment/Plan: There are no diagnoses linked to this encounter.

## 2022-02-22 ENCOUNTER — Other Ambulatory Visit: Payer: Self-pay | Admitting: Hematology and Oncology

## 2022-02-22 ENCOUNTER — Ambulatory Visit: Payer: Medicare PPO

## 2022-02-22 DIAGNOSIS — I428 Other cardiomyopathies: Secondary | ICD-10-CM

## 2022-02-22 LAB — CUP PACEART REMOTE DEVICE CHECK
Battery Remaining Longevity: 48 mo
Battery Voltage: 2.95 V
Brady Statistic AP VP Percent: 0.63 %
Brady Statistic AP VS Percent: 0.03 %
Brady Statistic AS VP Percent: 97.59 %
Brady Statistic AS VS Percent: 1.74 %
Brady Statistic RA Percent Paced: 0.67 %
Brady Statistic RV Percent Paced: 4.37 %
Date Time Interrogation Session: 20240212210607
Implantable Lead Connection Status: 753985
Implantable Lead Connection Status: 753985
Implantable Lead Connection Status: 753985
Implantable Lead Implant Date: 20130110
Implantable Lead Implant Date: 20130110
Implantable Lead Implant Date: 20130110
Implantable Lead Location: 753858
Implantable Lead Location: 753859
Implantable Lead Location: 753860
Implantable Lead Model: 4196
Implantable Lead Model: 5076
Implantable Lead Model: 6935
Implantable Pulse Generator Implant Date: 20181115
Lead Channel Impedance Value: 1273 Ohm
Lead Channel Impedance Value: 323 Ohm
Lead Channel Impedance Value: 380 Ohm
Lead Channel Impedance Value: 646 Ohm
Lead Channel Impedance Value: 722 Ohm
Lead Channel Impedance Value: 741 Ohm
Lead Channel Impedance Value: 798 Ohm
Lead Channel Impedance Value: 836 Ohm
Lead Channel Impedance Value: 836 Ohm
Lead Channel Pacing Threshold Amplitude: 0.625 V
Lead Channel Pacing Threshold Amplitude: 1.75 V
Lead Channel Pacing Threshold Amplitude: 2.5 V
Lead Channel Pacing Threshold Pulse Width: 0.4 ms
Lead Channel Pacing Threshold Pulse Width: 0.4 ms
Lead Channel Pacing Threshold Pulse Width: 0.8 ms
Lead Channel Sensing Intrinsic Amplitude: 0.625 mV
Lead Channel Sensing Intrinsic Amplitude: 0.625 mV
Lead Channel Sensing Intrinsic Amplitude: 16.375 mV
Lead Channel Sensing Intrinsic Amplitude: 16.375 mV
Lead Channel Setting Pacing Amplitude: 2 V
Lead Channel Setting Pacing Amplitude: 3 V
Lead Channel Setting Pacing Amplitude: 4 V
Lead Channel Setting Pacing Pulse Width: 0.8 ms
Lead Channel Setting Pacing Pulse Width: 1 ms
Lead Channel Setting Sensing Sensitivity: 0.9 mV
Zone Setting Status: 755011
Zone Setting Status: 755011

## 2022-02-24 ENCOUNTER — Telehealth: Payer: Self-pay | Admitting: *Deleted

## 2022-02-24 ENCOUNTER — Telehealth: Payer: Self-pay | Admitting: Hematology and Oncology

## 2022-02-24 DIAGNOSIS — D0512 Intraductal carcinoma in situ of left breast: Secondary | ICD-10-CM

## 2022-02-24 NOTE — Telephone Encounter (Signed)
Per Dr. Benay Spice: BMP and Zometa this month and in 6 months. Scheduling message sent.

## 2022-02-24 NOTE — Telephone Encounter (Signed)
Scheduled appointment per staff message. Left voicemail.

## 2022-02-24 NOTE — Telephone Encounter (Signed)
Called to request an appointment for her Zometa (last dose 02-18-21). Has transferred her care to Dr. Benay Spice as of January 2024. She adds that Dr. Sabra Heck (GYN) said she should receive it every 6 months.

## 2022-02-25 ENCOUNTER — Other Ambulatory Visit: Payer: Self-pay | Admitting: Nurse Practitioner

## 2022-02-25 DIAGNOSIS — M810 Age-related osteoporosis without current pathological fracture: Secondary | ICD-10-CM

## 2022-02-28 ENCOUNTER — Encounter: Payer: Self-pay | Admitting: *Deleted

## 2022-03-03 ENCOUNTER — Inpatient Hospital Stay: Payer: Medicare PPO

## 2022-03-03 ENCOUNTER — Inpatient Hospital Stay: Payer: Medicare PPO | Attending: Oncology

## 2022-03-03 DIAGNOSIS — M810 Age-related osteoporosis without current pathological fracture: Secondary | ICD-10-CM | POA: Diagnosis not present

## 2022-03-03 DIAGNOSIS — D0512 Intraductal carcinoma in situ of left breast: Secondary | ICD-10-CM | POA: Diagnosis not present

## 2022-03-03 DIAGNOSIS — Z79811 Long term (current) use of aromatase inhibitors: Secondary | ICD-10-CM | POA: Insufficient documentation

## 2022-03-03 LAB — BASIC METABOLIC PANEL - CANCER CENTER ONLY
Anion gap: 11 (ref 5–15)
BUN: 24 mg/dL — ABNORMAL HIGH (ref 8–23)
CO2: 24 mmol/L (ref 22–32)
Calcium: 9.6 mg/dL (ref 8.9–10.3)
Chloride: 105 mmol/L (ref 98–111)
Creatinine: 0.9 mg/dL (ref 0.44–1.00)
GFR, Estimated: >60 mL/min (ref >60)
Glucose, Bld: 102 mg/dL — ABNORMAL HIGH (ref 70–99)
Potassium: 3.8 mmol/L (ref 3.5–5.1)
Sodium: 140 mmol/L (ref 135–145)

## 2022-03-03 MED ORDER — SODIUM CHLORIDE 0.9 % IV SOLN: Freq: Once | INTRAVENOUS | Status: AC

## 2022-03-03 MED ORDER — ZOLEDRONIC ACID 4 MG/100ML IV SOLN
4.0000 mg | Freq: Once | INTRAVENOUS | Status: AC
  Administered 2022-03-03: 4 mg via INTRAVENOUS
  Filled 2022-03-03: qty 100

## 2022-03-03 NOTE — Patient Instructions (Signed)

## 2022-03-05 DIAGNOSIS — Z6824 Body mass index (BMI) 24.0-24.9, adult: Secondary | ICD-10-CM | POA: Diagnosis not present

## 2022-03-05 DIAGNOSIS — E559 Vitamin D deficiency, unspecified: Secondary | ICD-10-CM | POA: Diagnosis not present

## 2022-03-05 DIAGNOSIS — R5383 Other fatigue: Secondary | ICD-10-CM | POA: Diagnosis not present

## 2022-03-05 DIAGNOSIS — E7800 Pure hypercholesterolemia, unspecified: Secondary | ICD-10-CM | POA: Diagnosis not present

## 2022-03-05 DIAGNOSIS — Z131 Encounter for screening for diabetes mellitus: Secondary | ICD-10-CM | POA: Diagnosis not present

## 2022-03-05 DIAGNOSIS — Z79899 Other long term (current) drug therapy: Secondary | ICD-10-CM | POA: Diagnosis not present

## 2022-03-05 DIAGNOSIS — I509 Heart failure, unspecified: Secondary | ICD-10-CM | POA: Diagnosis not present

## 2022-03-05 DIAGNOSIS — Z0130 Encounter for examination of blood pressure without abnormal findings: Secondary | ICD-10-CM | POA: Diagnosis not present

## 2022-03-05 DIAGNOSIS — E039 Hypothyroidism, unspecified: Secondary | ICD-10-CM | POA: Diagnosis not present

## 2022-03-05 DIAGNOSIS — D539 Nutritional anemia, unspecified: Secondary | ICD-10-CM | POA: Diagnosis not present

## 2022-03-11 ENCOUNTER — Other Ambulatory Visit: Payer: Self-pay | Admitting: Physician Assistant

## 2022-03-11 ENCOUNTER — Telehealth: Payer: Self-pay | Admitting: *Deleted

## 2022-03-11 ENCOUNTER — Other Ambulatory Visit: Payer: Self-pay | Admitting: Internal Medicine

## 2022-03-11 DIAGNOSIS — Z7901 Long term (current) use of anticoagulants: Secondary | ICD-10-CM | POA: Diagnosis not present

## 2022-03-11 DIAGNOSIS — R195 Other fecal abnormalities: Secondary | ICD-10-CM | POA: Diagnosis not present

## 2022-03-11 DIAGNOSIS — Z8679 Personal history of other diseases of the circulatory system: Secondary | ICD-10-CM | POA: Diagnosis not present

## 2022-03-11 NOTE — Telephone Encounter (Signed)
   Pre-operative Risk Assessment    Patient Name: [name]  DOB: 02/09/1955 MRN: [id number]      Request for Surgical Clearance    Procedure:   COLONOSCOPY  Date of Surgery:  Clearance 04/07/22                                 Surgeon:  DR. Alessandra Bevels Surgeon's Group or Practice Name:  [facility] Phone number:  [phone] Fax number:  [phone]   Type of Clearance Requested:   - Medical  - Pharmacy:  Hold Apixaban (Eliquis) X'S 2 DAYS PRIOR   Type of Anesthesia:   PROPOFOL   Additional requests/questions:    Astrid Divine   03/11/2022, 4:21 PM

## 2022-03-14 DIAGNOSIS — L218 Other seborrheic dermatitis: Secondary | ICD-10-CM | POA: Diagnosis not present

## 2022-03-14 DIAGNOSIS — L718 Other rosacea: Secondary | ICD-10-CM | POA: Diagnosis not present

## 2022-03-14 NOTE — Telephone Encounter (Signed)
Patient with diagnosis of A Fib on Eliquis for anticoagulation.    Procedure: colonoscopy Date of procedure: 04/07/22   CHA2DS2-VASc Score = 4  This indicates a 4.8% annual risk of stroke. The patient's score is based upon: CHF History: 1 HTN History: 1 Diabetes History: 0 Stroke History: 0 Vascular Disease History: 0 Age Score: 1 Gender Score: 1    CrCl 85 mL/min Platelet count 256K   Per office protocol, patient can hold Eliquis for 2 days prior to procedure.     **This guidance is not considered finalized until pre-operative APP has relayed final recommendations.**

## 2022-03-14 NOTE — Telephone Encounter (Signed)
Left message to call back for tele pre op appt 

## 2022-03-14 NOTE — Telephone Encounter (Signed)
Prescription refill request for Eliquis received. Indication: a fib Last office visit: 11/08/21 Scr: 0.9 on 03/03/22 epic Age: 67 Weight: 73kg

## 2022-03-14 NOTE — Telephone Encounter (Signed)
   Name: [name]  DOB: 04-02-1955  MRN: [id number]  Primary Cardiologist: Buford Dresser, MD   Preoperative team, please contact this patient and set up a phone call appointment for further preoperative risk assessment. Please obtain consent and complete medication review. Thank you for your help.  I confirm that guidance regarding antiplatelet and oral anticoagulation therapy has been completed and, if necessary, noted below.   Patient with diagnosis of A Fib on Eliquis for anticoagulation.     Procedure: colonoscopy Date of procedure: 04/07/22     CHA2DS2-VASc Score = 4  This indicates a 4.8% annual risk of stroke. The patient's score is based upon: CHF History: 1 HTN History: 1 Diabetes History: 0 Stroke History: 0 Vascular Disease History: 0 Age Score: 1 Gender Score: 1     CrCl 85 mL/min Platelet count 256K     Per office protocol, patient can hold Eliquis for 2 days prior to procedure.      Deberah Pelton, NP 03/14/2022, 11:34 AM [place]

## 2022-03-15 ENCOUNTER — Telehealth: Payer: Self-pay | Admitting: *Deleted

## 2022-03-15 NOTE — Telephone Encounter (Signed)
Pt has been scheduled for tele pre op appt 03/22/22 @ 10 am. Consent has been given, though pt would like to review medications during the tele appt as she is in her car right now.     Patient Consent for Virtual Visit        [name] has provided verbal consent on 03/15/2022 for a virtual visit (video or telephone).   CONSENT FOR VIRTUAL VISIT FOR:  [name]  By participating in this virtual visit I agree to the following:  I hereby voluntarily request, consent and authorize [facility] and its employed or contracted physicians, physician assistants, nurse practitioners or other licensed health care professionals (the Practitioner), to provide me with telemedicine health care services (the "Services") as deemed necessary by the treating Practitioner. I acknowledge and consent to receive the Services by the Practitioner via telemedicine. I understand that the telemedicine visit will involve communicating with the Practitioner through live audiovisual communication technology and the disclosure of certain medical information by electronic transmission. I acknowledge that I have been given the opportunity to request an in-person assessment or other available alternative prior to the telemedicine visit and am voluntarily participating in the telemedicine visit.  I understand that I have the right to withhold or withdraw my consent to the use of telemedicine in the course of my care at any time, without affecting my right to future care or treatment, and that the Practitioner or I may terminate the telemedicine visit at any time. I understand that I have the right to inspect all information obtained and/or recorded in the course of the telemedicine visit and may receive copies of available information for a reasonable fee.  I understand that some of the potential risks of receiving the Services via telemedicine include:  Delay or interruption in medical evaluation due to  technological equipment failure or disruption; Information transmitted may not be sufficient (e.g. poor resolution of images) to allow for appropriate medical decision making by the Practitioner; and/or  In rare instances, security protocols could fail, causing a breach of personal health information.  Furthermore, I acknowledge that it is my responsibility to provide information about my medical history, conditions and care that is complete and accurate to the best of my ability. I acknowledge that Practitioner's advice, recommendations, and/or decision may be based on factors not within their control, such as incomplete or inaccurate data provided by me or distortions of diagnostic images or specimens that may result from electronic transmissions. I understand that the practice of medicine is not an exact science and that Practitioner makes no warranties or guarantees regarding treatment outcomes. I acknowledge that a copy of this consent can be made available to me via my patient portal ([facility]), or I can request a printed copy by calling the office of [facility].    I understand that my insurance will be billed for this visit.   I have read or had this consent read to me. I understand the contents of this consent, which adequately explains the benefits and risks of the Services being provided via telemedicine.  I have been provided ample opportunity to ask questions regarding this consent and the Services and have had my questions answered to my satisfaction. I give my informed consent for the services to be provided through the use of telemedicine in my medical care

## 2022-03-15 NOTE — Telephone Encounter (Signed)
Pt has been scheduled for tele pre op appt 03/22/22 @ 10 am. Consent has been given, though pt would like to review medications during the tele appt as she is in her car right now.

## 2022-03-17 DIAGNOSIS — G2581 Restless legs syndrome: Secondary | ICD-10-CM | POA: Diagnosis not present

## 2022-03-17 DIAGNOSIS — F5101 Primary insomnia: Secondary | ICD-10-CM | POA: Diagnosis not present

## 2022-03-17 DIAGNOSIS — G518 Other disorders of facial nerve: Secondary | ICD-10-CM | POA: Diagnosis not present

## 2022-03-17 DIAGNOSIS — M5459 Other low back pain: Secondary | ICD-10-CM | POA: Diagnosis not present

## 2022-03-17 DIAGNOSIS — M542 Cervicalgia: Secondary | ICD-10-CM | POA: Diagnosis not present

## 2022-03-17 DIAGNOSIS — F419 Anxiety disorder, unspecified: Secondary | ICD-10-CM | POA: Diagnosis not present

## 2022-03-21 DIAGNOSIS — F419 Anxiety disorder, unspecified: Secondary | ICD-10-CM | POA: Diagnosis not present

## 2022-03-21 DIAGNOSIS — R5383 Other fatigue: Secondary | ICD-10-CM | POA: Diagnosis not present

## 2022-03-21 DIAGNOSIS — E039 Hypothyroidism, unspecified: Secondary | ICD-10-CM | POA: Diagnosis not present

## 2022-03-21 DIAGNOSIS — I509 Heart failure, unspecified: Secondary | ICD-10-CM | POA: Diagnosis not present

## 2022-03-21 DIAGNOSIS — Z760 Encounter for issue of repeat prescription: Secondary | ICD-10-CM | POA: Diagnosis not present

## 2022-03-21 DIAGNOSIS — Z6824 Body mass index (BMI) 24.0-24.9, adult: Secondary | ICD-10-CM | POA: Diagnosis not present

## 2022-03-21 DIAGNOSIS — F32A Depression, unspecified: Secondary | ICD-10-CM | POA: Diagnosis not present

## 2022-03-21 DIAGNOSIS — Z0130 Encounter for examination of blood pressure without abnormal findings: Secondary | ICD-10-CM | POA: Diagnosis not present

## 2022-03-21 NOTE — Progress Notes (Unsigned)
Virtual Visit via Telephone Note   Because of [name]'s co-morbid illnesses, she is at least at moderate risk for complications without adequate follow up.  This format is felt to be most appropriate for this patient at this time.  The patient did not have access to video technology/had technical difficulties with video requiring transitioning to audio format only (telephone).  All issues noted in this document were discussed and addressed.  No physical exam could be performed with this format.  Please refer to the patient's chart for her consent to telehealth for [facility].  Evaluation Performed:  Preoperative cardiovascular risk assessment _____________   Date:  03/21/2022   Patient ID:  [name], DOB Nov 29, 1955, MRN [id number] Patient Location:  Home Provider location:   Office  Primary Care Provider:  Tilda Franco, FNP Primary Cardiologist:  Buford Dresser, MD  Chief Complaint / Patient Profile   67 y.o. y/o female with a h/o PAF, HFrEF, GERD, HTN, NICM, LBBB, CRT-D 2013 downgraded to CRT-P 2018, arthritis who is pending colonoscopy and presents today for telephonic preoperative cardiovascular risk assessment.  History of Present Illness    [name] is a 67 y.o. female who presents via audio/video conferencing for a telehealth visit today.  Pt was last seen in cardiology clinic on 08/23/2021 by Dr. Harrell Gave.  At that time [name] was doing well and was needing surgical clearance for hammertoe procedure.  She was doing well overall from a cardiac perspective.  The patient is now pending procedure as outlined above. Since her last visit, she has been doing well with no new cardiac complaints.  She denies chest pain, shortness of breath, lower extremity edema, fatigue, palpitations, melena, hematuria, hemoptysis, diaphoresis, weakness, presyncope, syncope, orthopnea, and PND.     Past Medical History    Past Medical  History:  Diagnosis Date   AF (paroxysmal atrial fibrillation) ([place])    Arthritis    BCC (basal cell carcinoma) 03/11/2003   left distal lower leg ankle-tx dr Tonia Brooms   Biventricular cardiac pacemaker in situ    a. prior CRT-D in 2013 with downgrade to CRT-Pacemaker only in 11/2016.   Breast cancer ([place]) [id number]   Chronic systolic CHF (congestive heart failure) (HCC)    Complication of anesthesia    aspiration   Essential tremor    GERD (gastroesophageal reflux disease)    otc   Hypertension    Hypothyroidism    LBBB (left bundle branch block)    conduction disorder   Migraines    Mild aortic insufficiency    NICM (nonischemic cardiomyopathy) ([place])    a. 2012: normal coronaries, EF 25-30%. b. improved to 55% 11/2016.   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   in situ- right lower leg-anterior (CX35FU)   Scoliosis    Past Surgical History:  Procedure Laterality Date   ARTERY BIOPSY  12/29/2011   Procedure: MINOR BIOPSY TEMPORAL ARTERY;  Surgeon: Edward Jolly, MD;  Location: [place];  Service: General;  Laterality: Right;  Right temporal artery biopsy   BACK SURGERY  12/31/2009   "for flat back syndrome; fused bottom of my back"   BI-VENTRICULAR IMPLANTABLE CARDIOVERTER DEFIBRILLATOR Left 01/20/2011   Procedure: BI-VENTRICULAR IMPLANTABLE CARDIOVERTER DEFIBRILLATOR  (  CRT-D);  Surgeon: Evans Lance, MD;  Location: [facility] CATH LAB;  Service: Cardiovascular;  Laterality: Left;   BIV ICD GENERATOR CHANGEOUT N/A 11/24/2016   Procedure: BIV ICD GENERATOR CHANGEOUT;  Surgeon: Evans Lance, MD;  Location: [place] CV LAB;  Service: Cardiovascular;  Laterality: N/A;   BREAST LUMPECTOMY WITH RADIOACTIVE SEED LOCALIZATION Left 01/22/2020    Procedure: LEFT BREAST LUMPECTOMY WITH RADIOACTIVE SEED LOCALIZATION;  Surgeon: Donnie Mesa, MD;  Location: [place];  Service: General;  Laterality: Left;  LMA   CHOLECYSTECTOMY  ~1996   FOOT NEUROMA SURGERY  ~ 2003   right   RE-EXCISION OF BREAST LUMPECTOMY Left 03/03/2020   Procedure: RE-EXCISION MARGINS OF LEFT BREAST LUMPECTOMY;  Surgeon: Donnie Mesa, MD;  Location: [place];  Service: General;  Laterality: Left;   ROTATOR CUFF REPAIR  ~ 2009   left   SHOULDER ARTHROSCOPY W/ ROTATOR CUFF REPAIR Right 09/21/2011   SKIN CANCER EXCISION     nose, forehead, left leg   TEE WITHOUT CARDIOVERSION N/A 03/01/2017   Procedure: TRANSESOPHAGEAL ECHOCARDIOGRAM (TEE);  Surgeon: Skeet Latch, MD;  Location: [place];  Service: Cardiovascular;  Laterality: N/A;   THYROID LOBECTOMY  ~ 2006   right   TUBAL LIGATION  ~ [place]  ~ 2009   VESICOVAGINAL FISTULA CLOSURE W/ TAH      Allergies  Allergies  Allergen Reactions   Other Nausea And Vomiting and Other (See Comments)    Most narcotic pain meds cause N/V and CHEST PAIN; needs an antiemetic to tolerate  Also, patient was diagnosed with Raynaud's disease and was told to NOT place IV(s) in either hand because of poor circulation   Iodine Other (See Comments)    "Blisters"   Tape Other (See Comments)    PATIENT'S SKIN IS VERY THIN; IT TEARS, BRUISES, AND BLEEDS VERY EASILY (PLEASE USE COBAN WRAP, IF POSSIBLE!!)   Codeine Other (See Comments)    Chest pain   Fentanyl Other (See Comments)    Bad dreams   Lexapro [Escitalopram] Rash   Morphine And Related Nausea And Vomiting    Home Medications    Prior to Admission medications   Medication Sig Start Date End Date Taking? Authorizing Provider  acetaminophen (TYLENOL) 325 MG tablet Take 650 mg by mouth as needed.    [provider]  ALPRAZolam Duanne Moron) 0.5 MG tablet Take 0.5 mg by mouth 3 (three) times daily as  needed for anxiety.    [provider]  amitriptyline (ELAVIL) 25 MG tablet Take '25mg'$  by mouth every night 09/10/16   [provider]  anastrozole (ARIMIDEX) 1 MG tablet TAKE 1 TABLET BY MOUTH EVERY DAY 02/22/22   Nicholas Lose, MD  CALCIUM PO Take 2 tablets by mouth daily.    [provider]  cetirizine (ZYRTEC) 10 MG tablet Take 10 mg by mouth daily.    [provider]  Cyanocobalamin 1000 MCG/ML KIT Inject 1,000 mcg every 30 (thirty) days as directed.    [provider]  eletriptan (RELPAX) 40 MG tablet Take 40 mg by mouth daily as needed for migraine. May repeat in 2 hours if necessary    [provider]  ELIQUIS 5 MG TABS tablet TAKE 1 TABLET BY MOUTH TWICE A DAY 03/14/22   Evans Lance, MD  famotidine (PEPCID) 20 MG tablet Take 20 mg by mouth daily as needed for heartburn or indigestion.    [provider]  gabapentin (  NEURONTIN) 600 MG tablet Take 600 mg by mouth 2 (two) times daily.    [provider]  ketoconazole (NIZORAL) 2 % cream Apply to corner of mouth nightly 09/08/20   Sheffield, Vida Roller R, PA-C  levothyroxine (SYNTHROID, LEVOTHROID) 100 MCG tablet Take 100 mcg daily before breakfast by mouth.    [provider]  Melatonin 5 MG CAPS Take 5 mg by mouth at bedtime.    [provider]  Menthol (BIOFREEZE) 10 % AERO     [provider]  Omega-3 Fatty Acids (FISH OIL) 1000 MG CAPS 1 capsule    [provider]  polyethylene glycol (MIRALAX / GLYCOLAX) packet Take 17 g by mouth every evening.    [provider]  propranolol (INDERAL) 60 MG tablet Take 1 tablet (60 mg total) by mouth daily. 11/10/20   Baldwin Jamaica, PA-C  spironolactone (ALDACTONE) 25 MG tablet TAKE 1/2 TABLET BY MOUTH EVERY DAY 11/22/21   Buford Dresser, MD  Varenicline Tartrate (TYRVAYA) 0.03 MG/ACT SOLN Place into the nose.    [provider]  VITAMIN D PO Take 2 capsules by mouth daily.     [provider]    Physical Exam    Vital Signs: Blood pressure today was 116/77  Given telephonic nature of communication, physical exam is limited. AAOx3. NAD. Normal affect.  Speech and respirations are unlabored.  Accessory Clinical Findings    None  Assessment & Plan    1.  Preoperative Cardiovascular Risk Assessment:  Ms. Guifarro perioperative risk of a major cardiac event is 6.6% according to the Revised Cardiac Risk Index (RCRI).  Therefore, she is at moderate risk for perioperative complications.  Her functional capacity is fair at 5.07 METs according to the Duke Activity Status Index (DASI). Recommendations: According to ACC/AHA guidelines, no further cardiovascular testing needed.  The patient may proceed to surgery at acceptable risk.   Antiplatelet and/or Anticoagulation Recommendations:  Eliquis (Apixaban) can be held for 2 days prior to surgery.  Please resume post op when felt to be safe.      The patient was advised that if she develops new symptoms prior to surgery to contact our office to arrange for a follow-up visit, and she verbalized understanding.  A copy of this note will be routed to requesting surgeon.  Time:   Today, I have spent 7 minutes with the patient with telehealth technology discussing medical history, symptoms, and management plan.     Mable Fill, Marissa Nestle, NP  03/21/2022, 1:00 PM

## 2022-03-22 ENCOUNTER — Ambulatory Visit: Payer: Medicare PPO | Attending: Cardiovascular Disease

## 2022-03-22 DIAGNOSIS — Z01810 Encounter for preprocedural cardiovascular examination: Secondary | ICD-10-CM | POA: Diagnosis not present

## 2022-03-23 ENCOUNTER — Ambulatory Visit (INDEPENDENT_AMBULATORY_CARE_PROVIDER_SITE_OTHER): Payer: Medicare PPO

## 2022-03-23 DIAGNOSIS — R3915 Urgency of urination: Secondary | ICD-10-CM

## 2022-03-23 DIAGNOSIS — R300 Dysuria: Secondary | ICD-10-CM | POA: Diagnosis not present

## 2022-03-23 DIAGNOSIS — R350 Frequency of micturition: Secondary | ICD-10-CM

## 2022-03-23 LAB — POCT URINALYSIS DIPSTICK
Bilirubin, UA: NEGATIVE
Glucose, UA: NEGATIVE
Ketones, UA: NEGATIVE
Leukocytes, UA: NEGATIVE
Nitrite, UA: NEGATIVE
Protein, UA: NEGATIVE
Spec Grav, UA: >=1.03 — AB (ref 1.010–1.025)
Urobilinogen, UA: 0.2 E.U./dL
pH, UA: 5.5 (ref 5.0–8.0)

## 2022-03-23 NOTE — Progress Notes (Signed)
Patient came in today with complaints of lower abdominal discomfort, urgency to urinate and burning while urinating. Patient states that she is currently taking Macrobid '100mg'$  that was given to her before. I explained to the patient that her being on this medication for a couple of days could possible alter the results, but we will still send it off for culture. tbw

## 2022-03-24 LAB — URINE CULTURE: Organism ID, Bacteria: NO GROWTH

## 2022-03-28 NOTE — Progress Notes (Signed)
Remote pacemaker transmission.   

## 2022-03-30 ENCOUNTER — Encounter (HOSPITAL_BASED_OUTPATIENT_CLINIC_OR_DEPARTMENT_OTHER): Payer: Self-pay | Admitting: Obstetrics & Gynecology

## 2022-03-30 ENCOUNTER — Ambulatory Visit (HOSPITAL_BASED_OUTPATIENT_CLINIC_OR_DEPARTMENT_OTHER): Payer: Medicare PPO | Admitting: Obstetrics & Gynecology

## 2022-03-30 ENCOUNTER — Other Ambulatory Visit (HOSPITAL_COMMUNITY)
Admission: RE | Admit: 2022-03-30 | Discharge: 2022-03-30 | Disposition: A | Discharge status: Home / Self Care | Payer: Medicare PPO | Source: Ambulatory Visit | Attending: Obstetrics & Gynecology | Admitting: Obstetrics & Gynecology

## 2022-03-30 VITALS — BP 112/53 | HR 60 | Ht 67.5 in | Wt 152.2 lb

## 2022-03-30 DIAGNOSIS — N898 Other specified noninflammatory disorders of vagina: Secondary | ICD-10-CM

## 2022-03-30 NOTE — Progress Notes (Signed)
GYNECOLOGY  VISIT  CC:   vaginal irritation  HPI: 67 y.o. G0P0000 Married White or Caucasian female here for complaint of vaginal irritation.  She has hx of UTIs and she was on suppressive antibitoics.  She did stop these so she came in last week thinking she had a UTI.  The urine culture was negative.  She has noticed some odor.  No vaginal bleeding.  Used one day OTC vaginal yeast treatment.  This didn't help at all.   Past Medical History:  Diagnosis Date   AF (paroxysmal atrial fibrillation) (HCC)    Arthritis    BCC (basal cell carcinoma) 03/11/2003   left distal lower leg ankle-tx dr Tonia Brooms   Biventricular cardiac pacemaker in situ    a. prior CRT-D in 2013 with downgrade to CRT-Pacemaker only in 11/2016.   Breast cancer ([place]) 123XX123   Chronic systolic CHF (congestive heart failure) (HCC)    Complication of anesthesia    aspiration   Essential tremor    GERD (gastroesophageal reflux disease)    otc   Hypertension    Hypothyroidism    LBBB (left bundle branch block)    conduction disorder   Migraines    Mild aortic insufficiency    NICM (nonischemic cardiomyopathy) ([place])    a. 2012: normal coronaries, EF 25-30%. b. improved to 55% 11/2016.   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   in situ- right lower leg-anterior (CX35FU)   Scoliosis     MEDS:   Current Outpatient Medications on File Prior to Visit  Medication Sig Dispense Refill   acetaminophen (TYLENOL) 325 MG tablet Take 650 mg by mouth as needed.     ALPRAZolam (XANAX) 0.5 MG tablet Take 0.5 mg by mouth 3 (three) times daily as needed for anxiety.     amitriptyline (ELAVIL) 25 MG tablet Take 25mg  by mouth every night  5   anastrozole (ARIMIDEX) 1 MG tablet TAKE 1 TABLET BY MOUTH EVERY DAY 90  tablet 0   CALCIUM PO Take 2 tablets by mouth daily.     cetirizine (ZYRTEC) 10 MG tablet Take 10 mg by mouth daily.     Cyanocobalamin 1000 MCG/ML KIT Inject 1,000 mcg every 30 (thirty) days as directed.     eletriptan (RELPAX) 40 MG tablet Take 40 mg by mouth daily as needed for migraine. May repeat in 2 hours if necessary     ELIQUIS 5 MG TABS tablet TAKE 1 TABLET BY MOUTH TWICE A DAY 180 tablet 1   famotidine (PEPCID) 20 MG tablet Take 20 mg by mouth daily as needed for heartburn or indigestion.     gabapentin (NEURONTIN) 600 MG tablet Take 600 mg by mouth 2 (two) times daily.     ketoconazole (NIZORAL) 2 % cream Apply to corner of mouth nightly 15 g 0   levothyroxine (SYNTHROID, LEVOTHROID) 100 MCG tablet Take 100 mcg daily before breakfast by mouth.     Melatonin 5 MG CAPS Take 5 mg by mouth at bedtime.     Menthol (BIOFREEZE) 10 % AERO      Omega-3 Fatty Acids (FISH OIL) 1000 MG CAPS 1 capsule     polyethylene glycol (MIRALAX / GLYCOLAX) packet Take 17 g by mouth every evening.  propranolol (INDERAL) 60 MG tablet Take 1 tablet (60 mg total) by mouth daily. 90 tablet 3   spironolactone (ALDACTONE) 25 MG tablet TAKE 1/2 TABLET BY MOUTH EVERY DAY 45 tablet 2   Varenicline Tartrate (TYRVAYA) 0.03 MG/ACT SOLN Place into the nose.     VITAMIN D PO Take 2 capsules by mouth daily.     No current facility-administered medications on file prior to visit.    ALLERGIES: Other, Iodine, Tape, Codeine, Fentanyl, Lexapro [escitalopram], and Morphine and related  SH:  married, non smoker  Review of Systems  Constitutional: Negative.   Genitourinary:        Odor and irritation    PHYSICAL EXAMINATION:    BP (!) 112/53 (BP Location: Right Arm, Patient Position: Sitting, Cuff Size: Large)   Pulse 60   Ht 5' 7.5" (1.715 m)   Wt 152 lb 3.2 oz (69 kg)   BMI 23.49 kg/m     General appearance: alert, cooperative and appears stated age Lymph:  no inguinal LAD noted  Pelvic: External  genitalia:  no lesions              Urethra:  normal appearing urethra with no masses, tenderness or lesions              Bartholins and Skenes: normal                 Vagina: normal appearing vagina with normal color and discharge, no lesions, odor noted              Cervix: absent               Chaperone, Octaviano Batty, CMA, was present for exam.  Assessment/Plan: 1. Vaginal irritation - testing for yeast and BV ordered today.  Will wait to treat once we have results.  Pt comfortable with this plan.   - Cervicovaginal ancillary only( [place])

## 2022-03-31 LAB — CERVICOVAGINAL ANCILLARY ONLY
Bacterial Vaginitis (gardnerella): NEGATIVE
Candida Glabrata: NEGATIVE
Candida Vaginitis: NEGATIVE
Comment: NEGATIVE
Comment: NEGATIVE
Comment: NEGATIVE

## 2022-04-04 ENCOUNTER — Other Ambulatory Visit: Payer: Self-pay | Admitting: Hematology and Oncology

## 2022-04-07 DIAGNOSIS — K648 Other hemorrhoids: Secondary | ICD-10-CM | POA: Diagnosis not present

## 2022-04-07 DIAGNOSIS — R195 Other fecal abnormalities: Secondary | ICD-10-CM | POA: Diagnosis not present

## 2022-04-07 DIAGNOSIS — D120 Benign neoplasm of cecum: Secondary | ICD-10-CM | POA: Diagnosis not present

## 2022-04-07 DIAGNOSIS — D123 Benign neoplasm of transverse colon: Secondary | ICD-10-CM | POA: Diagnosis not present

## 2022-04-07 DIAGNOSIS — K644 Residual hemorrhoidal skin tags: Secondary | ICD-10-CM | POA: Diagnosis not present

## 2022-04-07 DIAGNOSIS — D125 Benign neoplasm of sigmoid colon: Secondary | ICD-10-CM | POA: Diagnosis not present

## 2022-04-07 DIAGNOSIS — K5730 Diverticulosis of large intestine without perforation or abscess without bleeding: Secondary | ICD-10-CM | POA: Diagnosis not present

## 2022-04-07 LAB — HM COLONOSCOPY

## 2022-04-25 ENCOUNTER — Ambulatory Visit: Payer: Medicare PPO | Admitting: Hematology and Oncology

## 2022-04-28 ENCOUNTER — Ambulatory Visit: Payer: Medicare PPO | Admitting: Podiatry

## 2022-04-28 DIAGNOSIS — L905 Scar conditions and fibrosis of skin: Secondary | ICD-10-CM | POA: Diagnosis not present

## 2022-04-28 DIAGNOSIS — M2061 Acquired deformities of toe(s), unspecified, right foot: Secondary | ICD-10-CM | POA: Diagnosis not present

## 2022-04-28 DIAGNOSIS — M7751 Other enthesopathy of right foot: Secondary | ICD-10-CM

## 2022-04-28 NOTE — Progress Notes (Signed)
Subjective:  Patient ID: [name], female    DOB: 30-Aug-1955,  MRN: [id number]  Chief Complaint  Patient presents with   Toe Pain    67 y.o. female presents with the above complaint.  Patient presents with right second digit elevatus likely due to contracture of scar tissue of the second metatarsophalangeal joint.  Patient has a hammertoe history of hammertoe done by me in the past.  She states that the toe is not sitting against the ground.  She would like to discuss options to see if there is a way to reduce that.  Review of Systems: Negative except as noted in the HPI. Denies N/V/F/Ch.  Past Medical History:  Diagnosis Date   AF (paroxysmal atrial fibrillation) ([facility])    Arthritis    BCC (basal cell carcinoma) 03/11/2003   left distal lower leg ankle-tx dr Danella Deis   Biventricular cardiac pacemaker in situ    a. prior CRT-D in 2013 with downgrade to CRT-Pacemaker only in 11/2016.   Breast cancer (HCC) 12/12/2019   Chronic systolic CHF (congestive heart failure) (H[facility])    Complication of anesthesia    aspiration   Essential tremor    GERD (gastroesophageal reflux disease)    otc   Hypertension    Hypothyroidism    LBBB (left bundle branch block)    conduction disorder   Migraines    Mild aortic insufficiency    NICM (nonischemic cardiomyopathy) ([facility])    a. 2012: normal coronaries, EF 25-30%. b. improved to 55% 11/2016.   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   in situ- right lower leg-anterior (CX35FU)   Scoliosis     Current Outpatient Medications:    acetaminophen (TYLENOL) 325 MG tablet, Take 650 mg by mouth as needed., Disp: , Rfl:    ALPRAZolam (XANAX) 0.5 MG tablet, Take 0.5 mg by mouth 3 (three) times daily as needed for anxiety., Disp: , Rfl:     amitriptyline (ELAVIL) 25 MG tablet, Take  by mouth every night, Disp: , Rfl: 5   anastrozole (ARIMIDEX) 1 MG tablet, TAKE 1 TABLET BY MOUTH EVERY DAY, Disp: 90 tablet, Rfl: 2   CALCIUM PO, Take 2 tablets by mouth daily., Disp: , Rfl:    cetirizine (ZYRTEC) 10 MG tablet, Take 10 mg by mouth daily., Disp: , Rfl:    Cyanocobalamin 1000 MCG/ML KIT, Inject 1,000 mcg every 30 (thirty) days as directed., Disp: , Rfl:    eletriptan (RELPAX) 40 MG tablet, Take 40 mg by mouth daily as needed for migraine. May repeat in 2 hours if necessary, Disp: , Rfl:    ELIQUIS 5 MG TABS tablet, TAKE 1 TABLET BY MOUTH TWICE A DAY, Disp: 180 tablet, Rfl: 1   famotidine (PEPCID) 20 MG tablet, Take 20 mg by mouth daily as needed for heartburn or indigestion., Disp: , Rfl:    gabapentin (NEURONTIN) 600 MG tablet, Take 600 mg by mouth 2 (two) times daily., Disp: , Rfl:    ketoconazole (NIZORAL) 2 % cream, Apply to corner of mouth nightly, Disp: 15 g, Rfl: 0   levothyroxine (SYNTHROID, LEVOTHROID) 100 MCG tablet, Take 100 mcg daily before breakfast by mouth., Disp: , Rfl:    Melatonin 5 MG CAPS, Take 5 mg by mouth at bedtime., Disp: ,  Rfl:    Menthol (BIOFREEZE) 10 % AERO, , Disp: , Rfl:    Omega-3 Fatty Acids (FISH OIL) 1000 MG CAPS, 1 capsule, Disp: , Rfl:    polyethylene glycol (MIRALAX / GLYCOLAX) packet, Take 17 g by mouth every evening., Disp: , Rfl:    propranolol (INDERAL) 60 MG tablet, Take 1 tablet (60 mg total) by mouth daily., Disp: 90 tablet, Rfl: 3   spironolactone (ALDACTONE) 25 MG tablet, TAKE 1/2 TABLET BY MOUTH EVERY DAY, Disp: 45 tablet, Rfl: 2   Varenicline Tartrate (TYRVAYA) 0.03 MG/ACT SOLN, Place into the nose., Disp: , Rfl:    VITAMIN D PO, Take 2 capsules by mouth daily., Disp: , Rfl:   Social History   Tobacco Use  Smoking Status Never   Passive exposure: Never  Smokeless Tobacco Never    Allergies  Allergen Reactions   Other Nausea And Vomiting and Other (See Comments)    Most  narcotic pain meds cause N/V and CHEST PAIN; needs an antiemetic to tolerate  Also, patient was diagnosed with Raynaud's disease and was told to NOT place IV(s) in either hand because of poor circulation   Iodine Other (See Comments)    "Blisters"   Tape Other (See Comments)    PATIENT'S SKIN IS VERY THIN; IT TEARS, BRUISES, AND BLEEDS VERY EASILY (PLEASE USE COBAN WRAP, IF POSSIBLE!!)   Codeine Other (See Comments)    Chest pain   Fentanyl Other (See Comments)    Bad dreams   Lexapro [Escitalopram] Rash   Morphine And Related Nausea And Vomiting   Objective:  There were no vitals filed for this visit. There is no height or weight on file to calculate BMI. Constitutional Well developed. Well nourished.  Vascular Dorsalis pedis pulses palpable bilaterally. Posterior tibial pulses palpable bilaterally. Capillary refill normal to all digits.  No cyanosis or clubbing noted. Pedal hair growth normal.  Neurologic Normal speech. Oriented to person, place, and time. Epicritic sensation to light touch grossly present bilaterally.  Dermatologic Nails well groomed and normal in appearance. No open wounds. No skin lesions.  Orthopedic: Pain on palpation to the right second metatarsophalangeal joint.  Mild pain with range of motion of the joint scar tissue noted with a little bit contracted position of the second toe.  At level patient noted upon weightbearing of the second toe.   Radiographs: None Assessment:   1. Scar contracture   2. Capsulitis of metatarsophalangeal (MTP) joint of right foot   3. Toe deformity, acquired, right    Plan:  Patient was evaluated and treated and all questions answered.  Right second digit scar contracture with elevatus of the digit with underlying capsulitis -All questions and concerns were discussed with the patient in extensive detail.  Given the presence of scar contracture with underlying capsulitis I believe patient will benefit from steroid  injection to help decrease acute inflammatory component associate with pain.  Patient agrees with plan like to proceed with steroid injection -A steroid injection was performed at right second MTP using 1% plain Lidocaine and 10 mg of Kenalog. This was well tolerated. -May consider doing an extensor tenotomy in the future if it does not improve  No follow-ups on file.   Right second digit scar contracture previous hammertoe steriod in second mtp.

## 2022-05-05 ENCOUNTER — Ambulatory Visit: Payer: Medicare PPO | Attending: Cardiovascular Disease

## 2022-05-05 DIAGNOSIS — Z95810 Presence of automatic (implantable) cardiac defibrillator: Secondary | ICD-10-CM

## 2022-05-05 DIAGNOSIS — I428 Other cardiomyopathies: Secondary | ICD-10-CM

## 2022-05-05 DIAGNOSIS — I5022 Chronic systolic (congestive) heart failure: Secondary | ICD-10-CM

## 2022-05-05 LAB — CUP PACEART INCLINIC DEVICE CHECK
Battery Remaining Longevity: 54 mo
Battery Voltage: 2.95 V
Brady Statistic AP VP Percent: 5.1 %
Brady Statistic AP VS Percent: 0.13 %
Brady Statistic AS VP Percent: 93.08 %
Brady Statistic AS VS Percent: 1.69 %
Brady Statistic RA Percent Paced: 5.23 %
Brady Statistic RV Percent Paced: 3.52 %
Date Time Interrogation Session: 20240425123107
Implantable Lead Connection Status: 753985
Implantable Lead Connection Status: 753985
Implantable Lead Connection Status: 753985
Implantable Lead Implant Date: 20130110
Implantable Lead Implant Date: 20130110
Implantable Lead Implant Date: 20130110
Implantable Lead Location: 753858
Implantable Lead Location: 753859
Implantable Lead Location: 753860
Implantable Lead Model: 4196
Implantable Lead Model: 5076
Implantable Lead Model: 6935
Implantable Pulse Generator Implant Date: 20181115
Lead Channel Impedance Value: 1026 Ohm
Lead Channel Impedance Value: 1368 Ohm
Lead Channel Impedance Value: 380 Ohm
Lead Channel Impedance Value: 437 Ohm
Lead Channel Impedance Value: 627 Ohm
Lead Channel Impedance Value: 760 Ohm
Lead Channel Impedance Value: 836 Ohm
Lead Channel Impedance Value: 969 Ohm
Lead Channel Impedance Value: 988 Ohm
Lead Channel Pacing Threshold Amplitude: 0.625 V
Lead Channel Pacing Threshold Amplitude: 1.75 V
Lead Channel Pacing Threshold Amplitude: 2.5 V
Lead Channel Pacing Threshold Amplitude: 2.5 V
Lead Channel Pacing Threshold Pulse Width: 0.4 ms
Lead Channel Pacing Threshold Pulse Width: 0.4 ms
Lead Channel Pacing Threshold Pulse Width: 0.8 ms
Lead Channel Pacing Threshold Pulse Width: 1 ms
Lead Channel Sensing Intrinsic Amplitude: 0.875 mV
Lead Channel Sensing Intrinsic Amplitude: 1.625 mV
Lead Channel Sensing Intrinsic Amplitude: 18 mV
Lead Channel Sensing Intrinsic Amplitude: 19 mV
Lead Channel Setting Pacing Amplitude: 2 V
Lead Channel Setting Pacing Amplitude: 2.25 V
Lead Channel Setting Pacing Amplitude: 4 V
Lead Channel Setting Pacing Pulse Width: 0.8 ms
Lead Channel Setting Pacing Pulse Width: 1 ms
Lead Channel Setting Sensing Sensitivity: 0.9 mV
Zone Setting Status: 755011
Zone Setting Status: 755011

## 2022-05-05 NOTE — Patient Instructions (Signed)
Follow up in 6 months per recall.

## 2022-05-05 NOTE — Progress Notes (Signed)
6 month device clinic check to evaluate RV lead threshold. RV lead 2.5V at 1 ms.   Reviewed with Dr. Ladona Ridgel.  No changes made.  Follow up in 6  months for yearly appt.

## 2022-05-24 ENCOUNTER — Ambulatory Visit (INDEPENDENT_AMBULATORY_CARE_PROVIDER_SITE_OTHER): Payer: Medicare PPO

## 2022-05-24 DIAGNOSIS — I428 Other cardiomyopathies: Secondary | ICD-10-CM | POA: Diagnosis not present

## 2022-05-24 LAB — CUP PACEART REMOTE DEVICE CHECK
Battery Remaining Longevity: 54 mo
Battery Voltage: 2.95 V
Brady Statistic AP VP Percent: 2.01 %
Brady Statistic AP VS Percent: 0.06 %
Brady Statistic AS VP Percent: 96.11 %
Brady Statistic AS VS Percent: 1.82 %
Brady Statistic RA Percent Paced: 2.09 %
Brady Statistic RV Percent Paced: 0.26 %
Date Time Interrogation Session: 20240513222745
Implantable Lead Connection Status: 753985
Implantable Lead Connection Status: 753985
Implantable Lead Connection Status: 753985
Implantable Lead Implant Date: 20130110
Implantable Lead Implant Date: 20130110
Implantable Lead Implant Date: 20130110
Implantable Lead Location: 753858
Implantable Lead Location: 753859
Implantable Lead Location: 753860
Implantable Lead Model: 4196
Implantable Lead Model: 5076
Implantable Lead Model: 6935
Implantable Pulse Generator Implant Date: 20181115
Lead Channel Impedance Value: 1197 Ohm
Lead Channel Impedance Value: 323 Ohm
Lead Channel Impedance Value: 361 Ohm
Lead Channel Impedance Value: 608 Ohm
Lead Channel Impedance Value: 703 Ohm
Lead Channel Impedance Value: 703 Ohm
Lead Channel Impedance Value: 760 Ohm
Lead Channel Impedance Value: 779 Ohm
Lead Channel Impedance Value: 798 Ohm
Lead Channel Pacing Threshold Amplitude: 0.625 V
Lead Channel Pacing Threshold Amplitude: 1.875 V
Lead Channel Pacing Threshold Amplitude: 2.5 V
Lead Channel Pacing Threshold Pulse Width: 0.4 ms
Lead Channel Pacing Threshold Pulse Width: 0.4 ms
Lead Channel Pacing Threshold Pulse Width: 0.8 ms
Lead Channel Sensing Intrinsic Amplitude: 0.5 mV
Lead Channel Sensing Intrinsic Amplitude: 0.5 mV
Lead Channel Sensing Intrinsic Amplitude: 15.25 mV
Lead Channel Sensing Intrinsic Amplitude: 15.25 mV
Lead Channel Setting Pacing Amplitude: 2 V
Lead Channel Setting Pacing Amplitude: 3 V
Lead Channel Setting Pacing Amplitude: 4 V
Lead Channel Setting Pacing Pulse Width: 0.8 ms
Lead Channel Setting Pacing Pulse Width: 1 ms
Lead Channel Setting Sensing Sensitivity: 0.9 mV
Zone Setting Status: 755011
Zone Setting Status: 755011

## 2022-06-20 ENCOUNTER — Ambulatory Visit: Payer: Medicare PPO | Admitting: Orthopedic Surgery

## 2022-06-20 DIAGNOSIS — M19012 Primary osteoarthritis, left shoulder: Secondary | ICD-10-CM | POA: Diagnosis not present

## 2022-06-21 NOTE — Progress Notes (Unsigned)
Office Visit Note   Patient: [name]           Date of Birth: September 07, 1955           MRN: [id number] Visit Date: 06/20/2022 Requested by: Vania Rea, FNP [address] [place],  [place] [address] PCP: Vania Rea, FNP  Subjective: Chief Complaint  Patient presents with   Left Shoulder - Pain    HPI: [name] is a 67 y.o. female who presents to the office reporting left shoulder pain.  Last office visit 2022.  She did have a left subacromial injection 12/02/2020 which gave her good relief.  Unable to sleep on that side currently.  Pain is increasing and getting worse but it does not hurt all the time.  She is right-hand dominant.  Takes Tylenol Extra Strength without relief.  She would like a repeat injection today as the last one helped her for over a year.  It is hard for her to sleep on that left-hand side.  Does report some clicking.  She has had a CT scan several years ago which did show rotator cuff tear.  She is not particularly active with the left shoulder outside of activities of daily living.                ROS: All systems reviewed are negative as they relate to the chief complaint within the history of present illness.  Patient denies fevers or chills.  Assessment & Plan: Visit Diagnoses:  1. Arthritis of left shoulder region     Plan: Impression is worsening of left shoulder pain.  Repeat subacromial injection performed today.  We will also request physical therapy for balance as well as left rotator cuff strengthening of the subscap and infraspinatus.  1-2 times a week for 2 to 4 weeks plus a home exercise program.  She still wants to avoid surgery if possible.  Follow-up as needed.  Follow-Up Instructions: No follow-ups on file.   Orders:  Orders Placed This Encounter  Procedures   Ambulatory referral to Physical Therapy   No orders of the defined types were placed in this encounter.     Procedures: Large Joint Inj: L  subacromial bursa on 06/20/2022 11:52 PM Indications: diagnostic evaluation and pain Details: 18 G 1.5 in needle, posterior approach  Arthrogram: No  Medications: 9 mL bupivacaine 0.5 %; 40 mg methylPREDNISolone acetate 40 MG/ML; 5 mL lidocaine 1 % Outcome: tolerated well, no immediate complications Procedure, treatment alternatives, risks and benefits explained, specific risks discussed. Consent was given by the patient. Immediately prior to procedure a time out was called to verify the correct patient, procedure, equipment, support staff and site/side marked as required. Patient was prepped and draped in the usual sterile fashion.       Clinical Data: No additional findings.  Objective: Vital Signs: There were no vitals taken for this visit.  Physical Exam:  Constitutional: Patient appears well-developed HEENT:  Head: Normocephalic Eyes:EOM are normal Neck: Normal range of motion Cardiovascular: Normal rate Pulmonary/chest: Effort normal Neurologic: Patient is alert Skin: Skin is warm Psychiatric: Patient has normal mood and affect  Ortho Exam: Ortho exam demonstrates good active and passive shoulder range of motion on that left-hand side.  She does have some crepitus with internal and external rotation at 90 degrees of abduction.  No masses lymphadenopathy or skin changes noted in that shoulder girdle region.  Pretty reasonable strength to subscap infraspinatus and supraspinatus testing.  No asymmetric restriction  of external rotation at 15 degrees of abduction.  Specialty Comments:  No specialty comments available.  Imaging: No results found.   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   Rosacea 11/05/2019   Scar 11/05/2019   Biventricular cardiac pacemaker in situ 10/08/2018   Long term current use of anticoagulant therapy 10/23/2017   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   Trigeminal neuralgia 12/14/2012   Insomnia, persistent 11/03/2011   AICD (automatic cardioverter/defibrillator) present    Aortic regurgitation    Nonischemic cardiomyopathy (HCC) 01/21/2011   Esophagitis, reflux 08/24/2010   Idiopathic peripheral neuropathy 08/24/2010   Chronic systolic CHF (congestive heart failure) (HCC) 02/24/2010   Paroxysmal atrial fibrillation (HCC) 02/23/2010   Hypothyroidism    History of migraine headaches    Left bundle branch block    Scoliosis     Class: Chronic   Lumbar canal stenosis 12/25/2009   Anxiety state 07/24/2008   Avitaminosis D 03/21/2008   Atypical migraine 01/30/2008   Benign essential HTN 01/30/2008   Paroxysmal digital cyanosis 02/15/2007   Past Medical History:  Diagnosis Date   AF (paroxysmal atrial fibrillation) (HCC)    Arthritis    BCC (basal cell carcinoma) 03/11/2003   left distal lower leg ankle-tx dr Danella Deis   Biventricular cardiac pacemaker in situ    a. prior CRT-D in 2013 with downgrade to CRT-Pacemaker only in 11/2016.   Breast cancer (HCC) 12/12/2019   Chronic systolic CHF (congestive heart failure) (HCC)    Complication of anesthesia    aspiration   Essential tremor    GERD (gastroesophageal reflux disease)    otc   Hypertension    Hypothyroidism    LBBB (left bundle branch block)    conduction disorder   Migraines    Mild aortic insufficiency    NICM (nonischemic cardiomyopathy) (HCC)    a. 2012: normal coronaries, EF 25-30%. b. improved to 55% 11/2016.   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   right upper lip-mohs Dr.mitkov   SCC (squamous cell carcinoma) 09/08/2020   in situ- right lower leg-anterior (CX35FU)   Scoliosis     Family History  Problem Relation Age of Onset   Heart disease Mother    Heart failure Mother 19       CHF   Stroke Mother    Hypertension Mother    Heart disease Father 84   Macular degeneration Father    Heart disease Sister    Atrial fibrillation Sister    Heart disease Sister     Past Surgical History:  Procedure Laterality Date   ARTERY BIOPSY  12/29/2011   Procedure: MINOR BIOPSY TEMPORAL ARTERY;  Surgeon: Mariella Saa, MD;  Location: [facility];  Service: General;  Laterality: Right;  Right temporal artery biopsy   BACK SURGERY  12/31/2009   "for flat back syndrome; fused bottom of my back"   BI-VENTRICULAR IMPLANTABLE CARDIOVERTER DEFIBRILLATOR Left 01/20/2011   Procedure: BI-VENTRICULAR IMPLANTABLE CARDIOVERTER  DEFIBRILLATOR  (CRT-D);  Surgeon: Marinus Maw, MD;  Location: [facility] CATH LAB;  Service: Cardiovascular;  Laterality: Left;   BIV ICD GENERATOR CHANGEOUT N/A 11/24/2016   Procedure: BIV ICD GENERATOR CHANGEOUT;  Surgeon: Marinus Maw, MD;  Location: [facility] INVASIVE CV LAB;  Service: Cardiovascular;  Laterality: N/A;   BREAST LUMPECTOMY WITH RADIOACTIVE SEED LOCALIZATION Left 01/22/2020   Procedure: LEFT BREAST LUMPECTOMY WITH RADIOACTIVE SEED LOCALIZATION;  Surgeon: Manus Rudd, MD;  Location: [facility];  Service: General;  Laterality: Left;  LMA   CHOLECYSTECTOMY  ~1996   FOOT NEUROMA SURGERY  ~ 2003   right   RE-EXCISION OF BREAST LUMPECTOMY Left 03/03/2020   Procedure: RE-EXCISION MARGINS OF LEFT BREAST LUMPECTOMY;  Surgeon: Manus Rudd, MD;  Location: [facility];  Service: General;  Laterality: Left;   ROTATOR CUFF REPAIR  ~ 2009   left   SHOULDER ARTHROSCOPY W/ ROTATOR CUFF REPAIR Right 09/21/2011   SKIN CANCER EXCISION      nose, forehead, left leg   TEE WITHOUT CARDIOVERSION N/A 03/01/2017   Procedure: TRANSESOPHAGEAL ECHOCARDIOGRAM (TEE);  Surgeon: Chilton Si, MD;  Location: [facility];  Service: Cardiovascular;  Laterality: N/A;   THYROID LOBECTOMY  ~ 2006   right   TUBAL LIGATION  ~ 1983   VAGINAL HYSTERECTOMY  ~ 2009   VESICOVAGINAL FISTULA CLOSURE W/ TAH     Social History   Occupational History   Occupation: Runner, broadcasting/film/video    Comment: n/a  Tobacco Use   Smoking status: Never    Passive exposure: Never   Smokeless tobacco: Never  Vaping Use   Vaping Use: Never used  Substance and Sexual Activity   Alcohol use: No   Drug use: No   Sexual activity: Yes

## 2022-06-21 NOTE — Progress Notes (Signed)
Remote pacemaker transmission.   

## 2022-06-22 ENCOUNTER — Encounter: Payer: Self-pay | Admitting: Orthopedic Surgery

## 2022-06-22 MED ORDER — METHYLPREDNISOLONE ACETATE 40 MG/ML IJ SUSP
40.0000 mg | INTRAMUSCULAR | Status: AC | PRN
Start: 2022-06-20 — End: 2022-06-20
  Administered 2022-06-20: 40 mg via INTRA_ARTICULAR

## 2022-06-22 MED ORDER — LIDOCAINE HCL 1 % IJ SOLN
5.0000 mL | INTRAMUSCULAR | Status: AC | PRN
  Administered 2022-06-20: 5 mL

## 2022-06-22 MED ORDER — BUPIVACAINE HCL 0.5 % IJ SOLN
9.0000 mL | INTRAMUSCULAR | Status: AC | PRN
  Administered 2022-06-20: 9 mL via INTRA_ARTICULAR

## 2022-07-20 ENCOUNTER — Ambulatory Visit (HOSPITAL_BASED_OUTPATIENT_CLINIC_OR_DEPARTMENT_OTHER): Payer: Medicare PPO | Admitting: Family Medicine

## 2022-07-20 ENCOUNTER — Encounter (HOSPITAL_BASED_OUTPATIENT_CLINIC_OR_DEPARTMENT_OTHER): Payer: Self-pay | Admitting: Family Medicine

## 2022-07-20 VITALS — BP 105/47 | HR 58 | Temp 97.7°F | Ht 67.5 in | Wt 161.8 lb

## 2022-07-20 DIAGNOSIS — Z7689 Persons encountering health services in other specified circumstances: Secondary | ICD-10-CM

## 2022-07-20 DIAGNOSIS — M26621 Arthralgia of right temporomandibular joint: Secondary | ICD-10-CM | POA: Diagnosis not present

## 2022-07-20 DIAGNOSIS — E039 Hypothyroidism, unspecified: Secondary | ICD-10-CM

## 2022-07-20 DIAGNOSIS — G43009 Migraine without aura, not intractable, without status migrainosus: Secondary | ICD-10-CM

## 2022-07-20 DIAGNOSIS — L84 Corns and callosities: Secondary | ICD-10-CM

## 2022-07-20 MED ORDER — ELETRIPTAN HYDROBROMIDE 40 MG PO TABS: 40.0000 mg | ORAL_TABLET | Freq: Every day | ORAL | 2 refills | Status: DC | PRN

## 2022-07-20 NOTE — Progress Notes (Signed)
New Patient Office Visit  Subjective    Patient ID: [name], female    DOB: 30-Oct-1955  Age: 67 y.o. MRN: [id number]  CC:  Chief Complaint  Patient presents with   Establish Care   HPI [name] is a 67 year-old female who presents to establish care. PMH includes paroxysmal afib, BCC & SCC, biventricular PPM, breast cancer (L DCIS in Nov 2021 with lumpectomy and started anastrozole on 05/10/2020), GERD, HTN, LBBB, hypothyroidism, migraines, osteoporosis, Raynaud's disease, and NICM. Recently was seen for L shoulder pain by ortho- placed referral for PT.    Former PCP: Chrys Racer, FNP at [facility] Specialists include: cardiology, podiatry, oncology, ortho, endocrinology   Migraines: has noticed an increase in headaches, thinks it is r/t the heat/humidity- she has been going through all of her 9 pills a month and needs refill   Wound clinic- seeing podiatry for cracked, dry feet with multiple callouses but reports they do not help treat her rough feet  History of neuropathy- reports recent issues with TM joint  Reports her R ear has been intermittently aching with a dull pain- she thinks she might be grinding her teeth at night   Outpatient Encounter Medications as of 07/20/2022  Medication Sig   acetaminophen (TYLENOL) 325 MG tablet Take 650 mg by mouth as needed.   ALPRAZolam (XANAX) 0.5 MG tablet Take 0.5 mg by mouth 3 (three) times daily as needed for anxiety.   amitriptyline (ELAVIL) 25 MG tablet Take 25mg  by mouth every night   anastrozole (ARIMIDEX) 1 MG tablet TAKE 1 TABLET BY MOUTH EVERY DAY   Azelaic Acid 15 % gel Apply 1 Application topically 2 (two) times daily.   CALCIUM PO Take 2 tablets by mouth daily.   cetirizine (ZYRTEC) 10 MG tablet Take 10 mg by mouth daily.   Cyanocobalamin 1000 MCG/ML KIT Inject 1,000 mcg every 30 (thirty) days as directed.   doxycycline (VIBRAMYCIN) 50 MG capsule Take 50 mg by mouth daily.   DULoxetine (CYMBALTA) 60  MG capsule Take 60 mg by mouth daily.   ELIQUIS 5 MG TABS tablet TAKE 1 TABLET BY MOUTH TWICE A DAY   famotidine (PEPCID) 20 MG tablet Take 20 mg by mouth daily as needed for heartburn or indigestion.   gabapentin (NEURONTIN) 600 MG tablet Take 600 mg by mouth 2 (two) times daily.   ketoconazole (NIZORAL) 2 % cream Apply to corner of mouth nightly   levothyroxine (SYNTHROID, LEVOTHROID) 100 MCG tablet Take 100 mcg daily before breakfast by mouth.   Melatonin 5 MG CAPS Take 5 mg by mouth at bedtime.   Menthol (BIOFREEZE) 10 % AERO    Omega-3 Fatty Acids (FISH OIL) 1000 MG CAPS 1 capsule   omeprazole (PRILOSEC) 20 MG capsule Take 20 mg by mouth daily.   polyethylene glycol (MIRALAX / GLYCOLAX) packet Take 17 g by mouth every evening.   propranolol (INDERAL) 60 MG tablet Take 1 tablet (60 mg total) by mouth daily.   spironolactone (ALDACTONE) 25 MG tablet TAKE 1/2 TABLET BY MOUTH EVERY DAY   Varenicline Tartrate (TYRVAYA) 0.03 MG/ACT SOLN Place into the nose.   VITAMIN D PO Take 2 capsules by mouth daily.   [DISCONTINUED] eletriptan (RELPAX) 40 MG tablet Take 40 mg by mouth daily as needed for migraine. May repeat in 2 hours if necessary   eletriptan (RELPAX) 40 MG tablet Take 1 tablet (40 mg total) by mouth daily as needed for migraine. May repeat in 2  hours if necessary   No facility-administered encounter medications on file as of 07/20/2022.    Past Medical History:  Diagnosis Date   AF (paroxysmal atrial fibrillation) (HCC)    Arthritis    BCC (basal cell carcinoma) 03/11/2003   left distal lower leg ankle-tx dr Danella Deis   Biventricular cardiac pacemaker in situ    a. prior CRT-D in 2013 with downgrade to CRT-Pacemaker only in 11/2016.   Breast cancer (HCC) 12/12/2019   Chronic systolic CHF (congestive heart failure) (HCC)    Complication of anesthesia    aspiration   Essential tremor    GERD (gastroesophageal reflux disease)    otc   Hypertension    Hypothyroidism    LBBB (left  bundle branch block)    conduction disorder   Migraines    Mild aortic insufficiency    NICM (nonischemic cardiomyopathy) (HCC)    a. 2012: normal coronaries, EF 25-30%. b. improved to 55% 11/2016.   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   in situ- right lower leg-anterior (CX35FU)   Scoliosis     Past Surgical History:  Procedure Laterality Date   ARTERY BIOPSY  12/29/2011   Procedure: MINOR BIOPSY TEMPORAL ARTERY;  Surgeon: Mariella Saa, MD;  Location: [facility];  Service: General;  Laterality: Right;  Right temporal artery biopsy   BACK SURGERY  12/31/2009   "for flat back syndrome; fused bottom of my back"   BI-VENTRICULAR IMPLANTABLE CARDIOVERTER DEFIBRILLATOR Left 01/20/2011   Procedure: BI-VENTRICULAR IMPLANTABLE CARDIOVERTER DEFIBRILLATOR  (CRT-D);  Surgeon: Marinus Maw, MD;  Location: [facility] CATH LAB;  Service: Cardiovascular;  Laterality: Left;   BIV ICD GENERATOR CHANGEOUT N/A 11/24/2016   Procedure: BIV ICD GENERATOR CHANGEOUT;  Surgeon: Marinus Maw, MD;  Location: [facility] INVASIVE CV LAB;  Service: Cardiovascular;  Laterality: N/A;   BREAST LUMPECTOMY WITH RADIOACTIVE SEED LOCALIZATION Left 01/22/2020   Procedure: LEFT BREAST LUMPECTOMY WITH RADIOACTIVE SEED LOCALIZATION;  Surgeon: Manus Rudd, MD;  Location: [facility];  Service: General;  Laterality: Left;  LMA   CHOLECYSTECTOMY  ~1996   FOOT NEUROMA SURGERY  ~ 2003   right   RE-EXCISION OF BREAST LUMPECTOMY Left 03/03/2020   Procedure: RE-EXCISION MARGINS OF LEFT BREAST LUMPECTOMY;  Surgeon: Manus Rudd, MD;  Location: [facility];  Service: General;  Laterality: Left;   ROTATOR CUFF REPAIR  ~ 2009   left   SHOULDER ARTHROSCOPY W/ ROTATOR CUFF REPAIR  Right 09/21/2011   SKIN CANCER EXCISION     nose, forehead, left leg   TEE WITHOUT CARDIOVERSION N/A 03/01/2017   Procedure: TRANSESOPHAGEAL ECHOCARDIOGRAM (TEE);  Surgeon: Chilton Si, MD;  Location: [facility]COPY;  Service: Cardiovascular;  Laterality: N/A;   THYROID LOBECTOMY  ~ 2006   right   TUBAL LIGATION  ~ 1983   VAGINAL HYSTERECTOMY  ~ 2009   VESICOVAGINAL FISTULA CLOSURE W/ TAH      Family History  Problem Relation Age of Onset   Heart disease Mother    Heart failure Mother 110       CHF   Stroke Mother    Hypertension Mother    Heart disease Father 55   Macular degeneration Father    Heart disease Sister    Atrial  fibrillation Sister    Heart disease Sister    Review of Systems  HENT:  Positive for ear pain (R ear).   Respiratory:  Negative for cough and shortness of breath.   Cardiovascular:  Negative for chest pain, palpitations and leg swelling.  Gastrointestinal:  Negative for nausea and vomiting.  Musculoskeletal:  Positive for joint pain (R side of jaw & L shoulder).  Neurological:  Negative for dizziness, weakness and headaches.  Psychiatric/Behavioral:  Negative for depression. The patient is not nervous/anxious and does not have insomnia.      Objective    BP (!) 105/47 (BP Location: Left Arm, Patient Position: Sitting, Cuff Size: Normal)   Pulse (!) 58   Temp 97.7 F (36.5 C) (Oral)   Ht 5' 7.5" (1.715 m)   Wt 161 lb 12.8 oz (73.4 kg)   SpO2 98%   BMI 24.97 kg/m   Physical Exam Constitutional:      Appearance: Normal appearance.  HENT:     Right Ear: Tympanic membrane, ear canal and external ear normal.     Left Ear: Tympanic membrane, ear canal and external ear normal.     Nose: No congestion or rhinorrhea.  Cardiovascular:     Rate and Rhythm: Normal rate and regular rhythm.     Pulses: Normal pulses.     Heart sounds: Normal heart sounds.  Pulmonary:     Effort: Pulmonary effort is normal.     Breath sounds: Normal breath  sounds.  Neurological:     Mental Status: She is alert.  Psychiatric:        Mood and Affect: Mood normal.        Behavior: Behavior normal.        Thought Content: Thought content normal.        Judgment: Judgment normal.      Assessment & Plan:   1. Encounter to establish care Patient is a 67 year-old pleasant female who presents today to establish primary care with new provider. Reviewed past medical history, family history, social history, surgical history, and allergies today.  She see various specialists including cardiology, podiatry, oncology, orthopedics, and endocrinology. Patient has concerns today regarding calloused feet, right sided ear pain, and an increase in migraines.   2. Plantar callosity Patient reports that she has been seeing a podiatrist for her history of hammertoe. She has concerns about extensive cracking the soles of her feet. She reports the podiatrist is not able to address this and would like to be referred to a wound clinic to see if this can improve her cracked skin.   3. Atypical migraine Patient reports that she has been experiencing more frequent migraines and thinks it is related to the increase in humid temperatures. She reports her migraines are well controlled with eletriptan 40mg  as needed; however, she has been going through 10 tablets within a month. Discussed risk of rebound headaches with medication overuse. Due to significant cardiac history, patient would benefit from abortive or preventative CGRP antagonist medication. Discussed benefits of medication, how to take medication, possible side effects, and the ability to change from abortive therapy to preventative.  - Atogepant (QULIPTA) 10 MG TABS; Take 10 mg by mouth daily as needed for up to 1 dose. Take 1 dose at onset of migraine. If no relief, take 1 additional dose 2 hours later.  Dispense: 15 tablet; Refill: 2  4. Arthralgia of right temporomandibular joint Patient reports that she has  recently been dealing with an intermittent,  dull aching pain in her right ear. She reports grinding her teeth at night and notices the pain is worse in the morning. The pinna, tragus, and ear canal are non-tender and without swelling. The left and right tympanic membranes are translucent, pearly grey, and shiny with no bulging or retraction. Normal in appearance with good cone-shaped light reflection of light and smooth consistency. Landmarks are clearly visible. Provided reassurance to patient that no infection, fluid or blood accumulation, swelling, or perforation is present. Discussed use of nightly mouth guard- which patient reports she has recently purchased but not tried. Advised patient if she does not notice any improvement in pain after using mouth guard, can place referral to ENT for further evaluation and management.    Return in about 4 weeks (around 08/17/2022) for migraines .   Spent 45 minutes on this patient encounter, including preparation, chart review, face-to-face counseling with patient and coordination of care, and documentation of encounter.    Alyson Reedy, FNP

## 2022-07-23 MED ORDER — QULIPTA 10 MG PO TABS
10.0000 mg | ORAL_TABLET | Freq: Every day | ORAL | 2 refills | Status: DC | PRN
Start: 2022-07-23 — End: 2022-08-17

## 2022-07-26 ENCOUNTER — Telehealth (HOSPITAL_BASED_OUTPATIENT_CLINIC_OR_DEPARTMENT_OTHER): Payer: Self-pay

## 2022-07-26 ENCOUNTER — Other Ambulatory Visit (HOSPITAL_BASED_OUTPATIENT_CLINIC_OR_DEPARTMENT_OTHER): Payer: Self-pay | Admitting: Family Medicine

## 2022-07-26 DIAGNOSIS — R5383 Other fatigue: Secondary | ICD-10-CM

## 2022-07-26 DIAGNOSIS — R200 Anesthesia of skin: Secondary | ICD-10-CM

## 2022-07-26 DIAGNOSIS — D518 Other vitamin B12 deficiency anemias: Secondary | ICD-10-CM

## 2022-07-26 NOTE — Telephone Encounter (Signed)
Left patient a message regarding b12 injections. We received her neurology records ([facility]), they have no record of checking her levels, per PCP we need to check her levels first and then we may be able to resume injections

## 2022-07-27 ENCOUNTER — Ambulatory Visit (HOSPITAL_BASED_OUTPATIENT_CLINIC_OR_DEPARTMENT_OTHER): Payer: Medicare PPO | Admitting: Family Medicine

## 2022-08-05 ENCOUNTER — Ambulatory Visit: Payer: Medicare PPO | Attending: Orthopedic Surgery

## 2022-08-05 ENCOUNTER — Other Ambulatory Visit: Payer: Self-pay

## 2022-08-05 DIAGNOSIS — M25512 Pain in left shoulder: Secondary | ICD-10-CM | POA: Diagnosis present

## 2022-08-05 DIAGNOSIS — G8929 Other chronic pain: Secondary | ICD-10-CM | POA: Insufficient documentation

## 2022-08-05 DIAGNOSIS — M25612 Stiffness of left shoulder, not elsewhere classified: Secondary | ICD-10-CM | POA: Insufficient documentation

## 2022-08-05 DIAGNOSIS — M6281 Muscle weakness (generalized): Secondary | ICD-10-CM | POA: Diagnosis present

## 2022-08-05 DIAGNOSIS — R252 Cramp and spasm: Secondary | ICD-10-CM | POA: Insufficient documentation

## 2022-08-05 DIAGNOSIS — M19012 Primary osteoarthritis, left shoulder: Secondary | ICD-10-CM | POA: Insufficient documentation

## 2022-08-05 NOTE — Therapy (Signed)
OUTPATIENT PHYSICAL THERAPY UPPER EXTREMITY EVALUATION   Patient Name: [name] MRN: [id number] DOB:Jul 29, 1955, 67 y.o., female Today's Date: 08/05/2022  END OF SESSION:  PT End of Session - 08/05/22 0940     Visit Number 1    Date for PT Re-Evaluation 09/30/22    Authorization Type [employer]    PT Start Time 0935    PT Stop Time 1020    PT Time Calculation (min) 45 min    Activity Tolerance Patient tolerated treatment well    Behavior During Therapy WFL for tasks assessed/performed             Past Medical History:  Diagnosis Date   AF (paroxysmal atrial fibrillation) (HCC)    Arthritis    BCC (basal cell carcinoma) 03/11/2003   left distal lower leg ankle-tx dr Danella Deis   Biventricular cardiac pacemaker in situ    a. prior CRT-D in 2013 with downgrade to CRT-Pacemaker only in 11/2016.   Breast cancer (HCC) 12/12/2019   Chronic systolic CHF (congestive heart failure) (HCC)    Complication of anesthesia    aspiration   Essential tremor    GERD (gastroesophageal reflux disease)    otc   Hypertension    Hypothyroidism    LBBB (left bundle branch block)    conduction disorder   Migraines    Mild aortic insufficiency    NICM (nonischemic cardiomyopathy) (HCC)    a. 2012: normal coronaries, EF 25-30%. b. improved to 55% 11/2016.   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   in situ- right lower leg-anterior ([id number])   Scoliosis    Past Surgical History:  Procedure Laterality Date   ARTERY BIOPSY  12/29/2011   Procedure: MINOR BIOPSY TEMPORAL ARTERY;  Surgeon: Mariella Saa, MD;  Location: [facility];  Service: General;  Laterality: Right;  Right temporal artery biopsy   BACK SURGERY  12/31/2009   "for flat back syndrome;  fused bottom of my back"   BI-VENTRICULAR IMPLANTABLE CARDIOVERTER DEFIBRILLATOR Left 01/20/2011   Procedure: BI-VENTRICULAR IMPLANTABLE CARDIOVERTER DEFIBRILLATOR  (CRT-D);  Surgeon: Marinus Maw, MD;  Location: [facility] CATH LAB;  Service: Cardiovascular;  Laterality: Left;   BIV ICD GENERATOR CHANGEOUT N/A 11/24/2016   Procedure: BIV ICD GENERATOR CHANGEOUT;  Surgeon: Marinus Maw, MD;  Location[facility]VAS[facility] CV LAB;  Service: Cardiovascular;  Laterality: N/A;   BREAST LUMPECTOMY WITH RADIOACTIVE SEED LOCALIZATION Left 01/22/2020   Procedure: LEFT BREAST LUMPECTOMY WITH RADIOACTIVE SEED LOCALIZATION;  Surgeon: Manus Rudd, MD;  Location: [facility];  Service: General;  Laterality: Left;  LMA   CHOLECYSTECTOMY  ~1996   FOOT NEUROMA SURGERY  ~ 2003   right   RE-EXCISION OF BREAST LUMPECTOMY Left 03/03/2020   Procedure: RE-EXCISION MARGINS OF LEFT BREAST LUMPECTOMY;  Surgeon: Manus Rudd, MD;  Location: [facility];  Service: General;  Laterality: Left;   ROTATOR CUFF REPAIR  ~ 2009   left   SHOULDER ARTHROSCOPY W/ ROTATOR CUFF REPAIR Right 09/21/2011   SKIN CANCER EXCISION     nose, forehead, left leg   TEE WITHOUT CARDIOVERSION N/A 03/01/2017   Procedure: TRANSESOPHAGEAL ECHOCARDIOGRAM (TEE);  Surgeon: Chilton Si, MD;  Location: [facility] ENDOSCOPY;  Service: Cardiovascular;  Laterality: N/A;   THYROID  LOBECTOMY  ~ 2006   right   TUBAL LIGATION  ~ 1983   VAGINAL HYSTERECTOMY  ~ 2009   VESICOVAGINAL FISTULA CLOSURE W/ TAH     Patient Active Problem List   Diagnosis Date Noted   Biventricular ICD (implantable cardioverter-defibrillator) in place    DOE (dyspnea on exertion) 04/07/2021   Age-related osteoporosis without current pathological fracture 11/29/2020   Atrophy of vagina 11/29/2020   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   Encounter for counseling 09/17/2020   Breast CA (HCC) 02/18/2020   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   Rosacea 11/05/2019   Scar 11/05/2019   Biventricular cardiac pacemaker in situ 10/08/2018   Long term current use of anticoagulant therapy 10/23/2017   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   Trigeminal neuralgia 12/14/2012   Insomnia, persistent 11/03/2011   AICD (automatic cardioverter/defibrillator) present    Aortic regurgitation    Nonischemic cardiomyopathy (HCC) 01/21/2011   Esophagitis, reflux 08/24/2010   Idiopathic peripheral neuropathy 08/24/2010   Chronic systolic CHF (congestive heart failure) (HCC) 02/24/2010   Paroxysmal atrial fibrillation (HCC) 02/23/2010   Hypothyroidism    History of migraine headaches    Left bundle branch block    Scoliosis     Class: Chronic   Lumbar canal stenosis 12/25/2009   Anxiety state 07/24/2008   Avitaminosis D 03/21/2008   Atypical migraine 01/30/2008   Benign essential HTN 01/30/2008   Paroxysmal digital cyanosis 02/15/2007    PCP: Cammy Copa, MD   REFERRING PROVIDER: Cammy Copa, MD  REFERRING DIAG: [phone] (ICD-10-CM) - Arthritis of left shoulder region  THERAPY DIAG:  Chronic left shoulder pain  Muscle weakness (generalized)  Cramp and spasm  Stiffness of left shoulder, not elsewhere classified  Rationale for Evaluation and Treatment: Rehabilitation  ONSET DATE: 06/20/2022  SUBJECTIVE:                                                                                                                                                                                      SUBJECTIVE STATEMENT: Patient reports on going shoulder and neck issues for several months.  She is unable to use her arms overhead without pain.  She is able to do most of her usual community activities but has felt like cutting back on activities due to her constant pain. She states she lives with her husband.  Son lives out of town.  She has 2 dogs that she walks daily.  I feel like I just don't have a lot of energy.  I'm on  new medication that is an anti-depressant that is supposed to help with bone aches and pain.  I help at the church at times.  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  I still fix supper and clean my own house.  She was a Engineer, site.  Has someone come in to help with heavy duty cleaning once every two weeks.  Hx of spinal surgery/ fusion. Hand dominance: Right  PERTINENT HISTORY: Lumbar fusion to pelvic fusion  PAIN:  Are you having pain? Yes, left shoulder and neck  0/10 currently but 8-9/10 at worst  PRECAUTIONS: None  RED FLAGS: None   WEIGHT BEARING RESTRICTIONS: No  FALLS:  Has patient fallen in last 6 months? No  LIVING ENVIRONMENT: Lives with: lives with their spouse Lives in: House/apartment   OCCUPATION: Retired  PLOF: Independent, Independent with basic ADLs, Independent with household mobility without device, Independent with community mobility without device, Independent with homemaking with ambulation, Independent with gait, and Independent with transfers  PATIENT GOALS: to be able to use my arms without pain and for my neck to stop hurting  NEXT MD VISIT: prn  OBJECTIVE:   DIAGNOSTIC FINDINGS:  Patient not candidate for MRI due to pacemaker  COGNITION: Overall cognitive status: Within functional limits for tasks assessed     SENSATION: WFL  POSTURE: Rounded shoulders/ forward head  UPPER EXTREMITY ROM:   WFL but with painful arc and end range pain  UPPER EXTREMITY MMT:  Generally 4/5 left UE with exception of ER which is 4-/5  SHOULDER SPECIAL TESTS: Impingement tests: Painful arc test: positive     TODAY'S TREATMENT:                                                                                                                                         DATE: 08/05/22 Initial eval completed and initiated HEP  PATIENT EDUCATION: Education details: initiated hep Person educated: Patient Education  method: Programmer, multimedia, Facilities manager, Verbal cues, and Handouts Education comprehension: verbalized understanding and returned demonstration  HOME EXERCISE PROGRAM: Access Code: [email or web address] URL: [email or web address] Date: 08/05/2022 Prepared by: Mikey Kirschner  Exercises - Standing Cervical Flexion AROM  - 3-4 x daily - 7 x weekly - 1 sets - 5 reps - 5-10 seconds hold - Standing Cervical Rotation AROM  - 3-4 x daily - 7 x weekly - 1 sets - 5 reps - 5-10seconds hold - Standing Cervical Sidebending AROM  - 3-4 x daily - 7 x weekly - 1 sets - 5 reps - 5-10second hold - Standing Cervical Retraction  - 3-4 x daily - 7 x weekly - 1 sets - 5 reps - 5-10second hold - Single Arm Bent Over Shoulder Extension with Dumbbell  - 1 x daily - 7 x weekly - 3 sets - 10 reps - Bent Over Single Arm Shoulder Row with Dumbbell  - 1 x daily - 7 x weekly - 3 sets - 10 reps - Single Arm Bent  Over Shoulder Horizontal Abduction with Dumbbell - Palm Down  - 1 x daily - 7 x weekly - 3 sets - 10 reps - Sidelying Shoulder External Rotation Dumbbell  - 1 x daily - 7 x weekly - 3 sets - 10 reps - Single Arm Serratus Punches in Supine with Dumbbell  - 1 x daily - 7 x weekly - 3 sets - 10 reps  ASSESSMENT:  CLINICAL IMPRESSION: Patient is a 67 y.o. female who was seen today for physical therapy evaluation and treatment for left shoulder and neck pain.  She presents with severe c spine ROM limitations and weakness in postural muscles and left shoulder rotator cuff. She also has severe myofascial restriction, trigger points and taut bands in the upper traps, parascapular areas.  She would benefit from skilled PT for rotator cuff and postural strengthening along with DN for severe soft tissue restriction and trigger points.     OBJECTIVE IMPAIRMENTS: decreased mobility, decreased ROM, decreased strength, hypomobility, increased fascial restrictions, increased muscle spasms, impaired flexibility, impaired UE  functional use, postural dysfunction, and pain.   ACTIVITY LIMITATIONS: carrying, lifting, sleeping, transfers, bed mobility, bathing, toileting, and dressing  PARTICIPATION LIMITATIONS: meal prep, cleaning, laundry, driving, shopping, and church  PERSONAL FACTORS: Age, Fitness, Past/current experiences, and 1-2 comorbidities: multiple fusions, OA  are also affecting patient's functional outcome.   REHAB POTENTIAL: Good  CLINICAL DECISION MAKING: Stable/uncomplicated  EVALUATION COMPLEXITY: Low  GOALS: Goals reviewed with patient? Yes  SHORT TERM GOALS: Target date: 09/02/2022   Patient will be independent with initial HEP  Baseline: Goal status: INITIAL  2.  Pain report to be no greater than 4/10  Baseline:  Goal status: INITIAL  LONG TERM GOALS: Target date: 09/30/2022   Patient to be independent with advanced HEP  Baseline:  Goal status: INITIAL  2.  Patient to report pain no greater than 2/10  Baseline:  Goal status: INITIAL  3.  Patient to be able to sleep through the night  Baseline:  Goal status: INITIAL  4.  Patient will be able to reach overhead into cabinets and on top of shelves without pain  Baseline:  Goal status: INITIAL  5.  Patient to be able to stay involved in her church activities Baseline:  Goal status: INITIAL  6.  C spine ROM to improve to [facility] Baseline:  Goal status: INITIAL  PLAN: PT FREQUENCY: 2x/week  PT DURATION: 8 weeks  PLANNED INTERVENTIONS: Therapeutic exercises, Therapeutic activity, Neuromuscular re-education, Balance training, Patient/Family education, Self Care, Joint mobilization, Stair training, DME instructions, Aquatic Therapy, Dry Needling, Electrical stimulation, Spinal manipulation, Cryotherapy, Moist heat, Taping, Vasopneumatic device, Traction, Ultrasound, Ionotophoresis 4mg /ml Dexamethasone, Manual therapy, and Re-evaluation  PLAN FOR NEXT SESSION: Review HEP, Begin DN to upper traps and parascapular areas,  postural strengthening.     Victorino Dike B. Jesyca Weisenburger, PT 08/05/22 8:28 PM [facility] [address] [place], [place] [address] Phone # [phone] Fax [phone]

## 2022-08-08 ENCOUNTER — Other Ambulatory Visit: Payer: Self-pay | Admitting: Cardiology

## 2022-08-09 ENCOUNTER — Ambulatory Visit: Payer: Medicare PPO

## 2022-08-09 DIAGNOSIS — G8929 Other chronic pain: Secondary | ICD-10-CM

## 2022-08-09 DIAGNOSIS — M25612 Stiffness of left shoulder, not elsewhere classified: Secondary | ICD-10-CM

## 2022-08-09 DIAGNOSIS — R252 Cramp and spasm: Secondary | ICD-10-CM

## 2022-08-09 DIAGNOSIS — M25512 Pain in left shoulder: Secondary | ICD-10-CM | POA: Diagnosis not present

## 2022-08-09 DIAGNOSIS — M6281 Muscle weakness (generalized): Secondary | ICD-10-CM

## 2022-08-09 NOTE — Therapy (Signed)
OUTPATIENT PHYSICAL THERAPY TREATMENT   Patient Name: [name] MRN: [id number] DOB:27-Jan-1955, 67 y.o., female Today's Date: 08/09/2022  END OF SESSION:  PT End of Session - 08/09/22 1155     Visit Number 2    Date for PT Re-Evaluation 09/30/22    Authorization Type [employer]    Authorization Time Period 16 visits. 08/05/2022-09/30/2022    Authorization - Visit Number 2    Authorization - Number of Visits 16    Progress Note Due on Visit 10    PT Start Time 1102    PT Stop Time 1147    PT Time Calculation (min) 45 min    Activity Tolerance Patient tolerated treatment well    Behavior During Therapy WFL for tasks assessed/performed              Past Medical History:  Diagnosis Date   AF (paroxysmal atrial fibrillation) (HCC)    Arthritis    BCC (basal cell carcinoma) 03/11/2003   left distal lower leg ankle-tx dr Danella Deis   Biventricular cardiac pacemaker in situ    a. prior CRT-D in 2013 with downgrade to CRT-Pacemaker only in 11/2016.   Breast cancer (HCC) 12/12/2019   Chronic systolic CHF (congestive heart failure) (HCC)    Complication of anesthesia    aspiration   Essential tremor    GERD (gastroesophageal reflux disease)    otc   Hypertension    Hypothyroidism    LBBB (left bundle branch block)    conduction disorder   Migraines    Mild aortic insufficiency    NICM (nonischemic cardiomyopathy) (HCC)    a. 2012: normal coronaries, EF 25-30%. b. improved to 55% 11/2016.   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   in situ- right lower leg-anterior ([id number])   Scoliosis    Past Surgical History:  Procedure Laterality Date   ARTERY BIOPSY  12/29/2011   Procedure: MINOR BIOPSY TEMPORAL ARTERY;  Surgeon: Mariella Saa, MD;   Location: [facility];  Service: General;  Laterality: Right;  Right temporal artery biopsy   BACK SURGERY  12/31/2009   "for flat back syndrome; fused bottom of my back"   BI-VENTRICULAR IMPLANTABLE CARDIOVERTER DEFIBRILLATOR Left 01/20/2011   Procedure: BI-VENTRICULAR IMPLANTABLE CARDIOVERTER DEFIBRILLATOR  (CRT-D);  Surgeon: Marinus Maw, MD;  Location: [facility];  Service: Cardiovascular;  Laterality: Left;   BIV ICD GENERATOR CHANGEOUT N/A 11/24/2016   Procedure: BIV ICD GENERATOR CHANGEOUT;  Surgeon: Marinus Maw, MD;  Location: [facility] INVASIVE CV LAB;  Service: Cardiovascular;  Laterality: N/A;   BREAST LUMPECTOMY WITH RADIOACTIVE SEED LOCALIZATION Left 01/22/2020   Procedure: LEFT BREAST LUMPECTOMY WITH RADIOACTIVE SEED LOCALIZATION;  Surgeon: Manus Rudd, MD;  Location: [facility];  Service: General;  Laterality: Left;  LMA   CHOLECYSTECTOMY  ~1996   FOOT NEUROMA SURGERY  ~ 2003   right   RE-EXCISION OF BREAST LUMPECTOMY Left 03/03/2020   Procedure: RE-EXCISION MARGINS OF LEFT BREAST LUMPECTOMY;  Surgeon: Manus Rudd, MD;  Location: [facility];  Service: General;  Laterality: Left;   ROTATOR CUFF REPAIR  ~ 2009   left   SHOULDER ARTHROSCOPY W/ ROTATOR CUFF REPAIR Right 09/21/2011   SKIN CANCER EXCISION     nose,  forehead, left leg   TEE WITHOUT CARDIOVERSION N/A 03/01/2017   Procedure: TRANSESOPHAGEAL ECHOCARDIOGRAM (TEE);  Surgeon: Chilton Si, MD;  Location: [facility] ENDOSCOPY;  Service: Cardiovascular;  Laterality: N/A;   THYROID LOBECTOMY  ~ 2006   right   TUBAL LIGATION  ~ 1983   VAGINAL HYSTERECTOMY  ~ 2009   VESICOVAGINAL FISTULA CLOSURE W/ TAH     Patient Active Problem List   Diagnosis Date Noted   Biventricular ICD (implantable cardioverter-defibrillator) in place    DOE (dyspnea on exertion) 04/07/2021   Age-related osteoporosis without current pathological fracture 11/29/2020   Atrophy of vagina 11/29/2020    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   Encounter for counseling 09/17/2020   Breast CA (HCC) 02/18/2020   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   Rosacea 11/05/2019   Scar 11/05/2019   Biventricular cardiac pacemaker in situ 10/08/2018   Long term current use of anticoagulant therapy 10/23/2017   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   Trigeminal neuralgia 12/14/2012   Insomnia, persistent 11/03/2011   AICD (automatic cardioverter/defibrillator) present    Aortic regurgitation    Nonischemic cardiomyopathy (HCC) 01/21/2011   Esophagitis, reflux 08/24/2010   Idiopathic peripheral neuropathy 08/24/2010   Chronic systolic CHF (congestive heart failure) (HCC) 02/24/2010   Paroxysmal atrial fibrillation (HCC) 02/23/2010   Hypothyroidism    History of migraine headaches    Left bundle branch block    Scoliosis     Class: Chronic   Lumbar canal stenosis 12/25/2009   Anxiety state 07/24/2008   Avitaminosis D 03/21/2008   Atypical migraine 01/30/2008   Benign essential HTN 01/30/2008   Paroxysmal digital cyanosis 02/15/2007    PCP: Cammy Copa, MD   REFERRING PROVIDER: Cammy Copa, MD  REFERRING DIAG: [phone] (ICD-10-CM) - Arthritis of left shoulder region  THERAPY DIAG:  Chronic left shoulder pain  Muscle weakness (generalized)  Cramp and spasm  Stiffness of left shoulder, not elsewhere classified  Rationale for Evaluation and Treatment: Rehabilitation  ONSET DATE: 06/20/2022  SUBJECTIVE:                                                                                                                                                                                      SUBJECTIVE STATEMENT: I did the exercises some since I was here last.  I am stiff today.    Patient reports on going shoulder and neck issues for several months.  She is unable to use her arms overhead without pain.  She is able to do most of her usual community  activities but has felt like cutting back on activities due to her constant pain. She states  she lives with her husband.  Son lives out of town.  She has 2 dogs that she walks daily.  I feel like I just don't have a lot of energy.  I'm on new medication that is an anti-depressant that is supposed to help with bone aches and pain.  I help at the church at times.  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  I still fix supper and clean my own house.  She was a Engineer, site.  Has someone come in to help with heavy duty cleaning once every two weeks.  Hx of spinal surgery/ fusion. Hand dominance: Right  PERTINENT HISTORY: Lumbar fusion to pelvic fusion  PAIN:  Are you having pain? Yes, left shoulder and neck  0/10 currently but 8-9/10 at worst 08/09/22: stiffness today, no pain  PRECAUTIONS: None  RED FLAGS: None   WEIGHT BEARING RESTRICTIONS: No  FALLS:  Has patient fallen in last 6 months? No  LIVING ENVIRONMENT: Lives with: lives with their spouse Lives in: House/apartment   OCCUPATION: Retired  PLOF: Independent, Independent with basic ADLs, Independent with household mobility without device, Independent with community mobility without device, Independent with homemaking with ambulation, Independent with gait, and Independent with transfers  PATIENT GOALS: to be able to use my arms without pain and for my neck to stop hurting  NEXT MD VISIT: prn  OBJECTIVE:   DIAGNOSTIC FINDINGS:  Patient not candidate for MRI due to pacemaker  COGNITION: Overall cognitive status: Within functional limits for tasks assessed     SENSATION: WFL  POSTURE: Rounded shoulders/ forward head  UPPER EXTREMITY ROM:   WFL but with painful arc and end range pain  UPPER EXTREMITY MMT:  Generally 4/5 left UE with exception of ER which is 4-/5  SHOULDER SPECIAL TESTS: Impingement tests: Painful arc test: positive     TODAY'S TREATMENT:       DATE:  08/09/22 Arm bike: level 1x 4 min (2/2)-PT present to discuss progress Cervical A/ROM: 3 ways 10 secondx 3 each Bent over row, horizontal abduction x10 each  Trigger Point Dry-Needling  Treatment instructions: Expect mild to moderate muscle soreness. S/S of pneumothorax if dry needled over a lung field, and to seek immediate medical attention should they occur. Patient verbalized understanding of these instructions and education.  Patient Consent Given: Yes Education handout provided: Previously provided Muscles treated: bil upper traps, lower cervical and thoracic multifidi bil, Rt rhomboids Treatment response/outcome: Utilized skilled palpation to identify trigger points.  During dry needling able to palpate muscle twitch and muscle elongation  Elongation and release to bil neck and scapular region Skilled palpation and monitoring by PT during dry needling                                                                                                                              DATE: 08/05/22 Initial eval completed and initiated HEP  PATIENT EDUCATION:  Education details: initiated hep Person educated: Patient Education method: Programmer, multimedia, Facilities manager, Verbal cues, and Handouts Education comprehension: verbalized understanding and returned demonstration  HOME EXERCISE PROGRAM: Access Code: [email or web address] URL: [email or web address] Date: 08/05/2022 Prepared by: Mikey Kirschner  Exercises - Standing Cervical Flexion AROM  - 3-4 x daily - 7 x weekly - 1 sets - 5 reps - 5-10 seconds hold - Standing Cervical Rotation AROM  - 3-4 x daily - 7 x weekly - 1 sets - 5 reps - 5-10seconds hold - Standing Cervical Sidebending AROM  - 3-4 x daily - 7 x weekly - 1 sets - 5 reps - 5-10second hold - Standing Cervical Retraction  - 3-4 x daily - 7 x weekly - 1 sets - 5 reps - 5-10second hold - Single Arm Bent Over Shoulder Extension with Dumbbell  - 1 x daily - 7 x weekly - 3 sets - 10  reps - Bent Over Single Arm Shoulder Row with Dumbbell  - 1 x daily - 7 x weekly - 3 sets - 10 reps - Single Arm Bent Over Shoulder Horizontal Abduction with Dumbbell - Palm Down  - 1 x daily - 7 x weekly - 3 sets - 10 reps - Sidelying Shoulder External Rotation Dumbbell  - 1 x daily - 7 x weekly - 3 sets - 10 reps - Single Arm Serratus Punches in Supine with Dumbbell  - 1 x daily - 7 x weekly - 3 sets - 10 reps  ASSESSMENT:  CLINICAL IMPRESSION: First time follow-up after evaluation. Pt reports moderate compliance with HEP.  PT emphasized the importance of compliance with HEP after DN for maximized effect.  PT reviewed HEP with pt and she demonstrated all aspects correctly.  Minor tactile cues for scapular and posterior shoulder activation.  Fatigue of Lt UE with posterior shoulder strength exercises.  Pt with good response to DN with twitch and improved tissue mobility after needling and manual therapy.  Patient will benefit from skilled PT to address the below impairments and improve overall function.    OBJECTIVE IMPAIRMENTS: decreased mobility, decreased ROM, decreased strength, hypomobility, increased fascial restrictions, increased muscle spasms, impaired flexibility, impaired UE functional use, postural dysfunction, and pain.   ACTIVITY LIMITATIONS: carrying, lifting, sleeping, transfers, bed mobility, bathing, toileting, and dressing  PARTICIPATION LIMITATIONS: meal prep, cleaning, laundry, driving, shopping, and church  PERSONAL FACTORS: Age, Fitness, Past/current experiences, and 1-2 comorbidities: multiple fusions, OA  are also affecting patient's functional outcome.   REHAB POTENTIAL: Good  CLINICAL DECISION MAKING: Stable/uncomplicated  EVALUATION COMPLEXITY: Low  GOALS: Goals reviewed with patient? Yes  SHORT TERM GOALS: Target date: 09/02/2022   Patient will be independent with initial HEP  Baseline: Goal status: INITIAL  2.  Pain report to be no greater than 4/10   Baseline:  Goal status: INITIAL  LONG TERM GOALS: Target date: 09/30/2022   Patient to be independent with advanced HEP  Baseline:  Goal status: INITIAL  2.  Patient to report pain no greater than 2/10  Baseline:  Goal status: INITIAL  3.  Patient to be able to sleep through the night  Baseline:  Goal status: INITIAL  4.  Patient will be able to reach overhead into cabinets and on top of shelves without pain  Baseline:  Goal status: INITIAL  5.  Patient to be able to stay involved in her church activities Baseline:  Goal status: INITIAL  6.  C spine ROM to improve to [facility] Baseline:  Goal status: INITIAL  PLAN:  PT FREQUENCY: 2x/week  PT DURATION: 8 weeks  PLANNED INTERVENTIONS: Therapeutic exercises, Therapeutic activity, Neuromuscular re-education, Balance training, Patient/Family education, Self Care, Joint mobilization, Stair training, DME instructions, Aquatic Therapy, Dry Needling, Electrical stimulation, Spinal manipulation, Cryotherapy, Moist heat, Taping, Vasopneumatic device, Traction, Ultrasound, Ionotophoresis 4mg /ml Dexamethasone, Manual therapy, and Re-evaluation  PLAN FOR NEXT SESSION: assess response to DN, repeat if helpful.  Work on [employer] strength, postural strength, cervical/thoracic mobility   Jennifer B. Fields, PT 08/09/22 11:56 AM [facility] [address] [place], [place] [address] Phone # [phone] Fax [phone]

## 2022-08-11 ENCOUNTER — Ambulatory Visit: Payer: Medicare PPO | Attending: Orthopedic Surgery | Admitting: Physical Therapy

## 2022-08-11 DIAGNOSIS — R252 Cramp and spasm: Secondary | ICD-10-CM | POA: Insufficient documentation

## 2022-08-11 DIAGNOSIS — M25612 Stiffness of left shoulder, not elsewhere classified: Secondary | ICD-10-CM | POA: Insufficient documentation

## 2022-08-11 DIAGNOSIS — M6281 Muscle weakness (generalized): Secondary | ICD-10-CM | POA: Diagnosis present

## 2022-08-11 DIAGNOSIS — G8929 Other chronic pain: Secondary | ICD-10-CM | POA: Diagnosis present

## 2022-08-11 DIAGNOSIS — M25512 Pain in left shoulder: Secondary | ICD-10-CM | POA: Diagnosis not present

## 2022-08-11 NOTE — Therapy (Signed)
OUTPATIENT PHYSICAL THERAPY TREATMENT   Patient Name: [name] MRN: [id number] DOB:March 26, 1955, 67 y.o., female Today's Date: 08/11/2022  END OF SESSION:  PT End of Session - 08/11/22 1358     Visit Number 3    Date for PT Re-Evaluation 09/30/22    Authorization Type [employer]    Authorization Time Period 16 visits. 08/05/2022-09/30/2022    Authorization - Visit Number 3    Authorization - Number of Visits 16    Progress Note Due on Visit 10    PT Start Time 1400    PT Stop Time 1440    PT Time Calculation (min) 40 min    Activity Tolerance Patient tolerated treatment well              Past Medical History:  Diagnosis Date   AF (paroxysmal atrial fibrillation) (HCC)    Arthritis    BCC (basal cell carcinoma) 03/11/2003   left distal lower leg ankle-tx dr Danella Deis   Biventricular cardiac pacemaker in situ    a. prior CRT-D in 2013 with downgrade to CRT-Pacemaker only in 11/2016.   Breast cancer (HCC) 12/12/2019   Chronic systolic CHF (congestive heart failure) (HCC)    Complication of anesthesia    aspiration   Essential tremor    GERD (gastroesophageal reflux disease)    otc   Hypertension    Hypothyroidism    LBBB (left bundle branch block)    conduction disorder   Migraines    Mild aortic insufficiency    NICM (nonischemic cardiomyopathy) (HCC)    a. 2012: normal coronaries, EF 25-30%. b. improved to 55% 11/2016.   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   in situ- right lower leg-anterior ([id number])   Scoliosis    Past Surgical History:  Procedure Laterality Date   ARTERY BIOPSY  12/29/2011   Procedure: MINOR BIOPSY TEMPORAL ARTERY;  Surgeon: Mariella Saa, MD;  Location: [facility];  Service: General;   Laterality: Right;  Right temporal artery biopsy   BACK SURGERY  12/31/2009   "for flat back syndrome; fused bottom of my back"   BI-VENTRICULAR IMPLANTABLE CARDIOVERTER DEFIBRILLATOR Left 01/20/2011   Procedure: BI-VENTRICULAR IMPLANTABLE CARDIOVERTER DEFIBRILLATOR  (CRT-D);  Surgeon: Marinus Maw, MD;  Location: [facility] CATH LAB;  Service: Cardiovascular;  Laterality: Left;   BIV ICD GENERATOR CHANGEOUT N/A 11/24/2016   Procedure: BIV ICD GENERATOR CHANGEOUT;  Surgeon: Marinus Maw, MD;  Location: [facility] INVASIVE CV LAB;  Service: Cardiovascular;  Laterality: N/A;   BREAST LUMPECTOMY WITH RADIOACTIVE SEED LOCALIZATION Left 01/22/2020   Procedure: LEFT BREAST LUMPECTOMY WITH RADIOACTIVE SEED LOCALIZATION;  Surgeon: Manus Rudd, MD;  Location: [facility];  Service: General;  Laterality: Left;  LMA   CHOLECYSTECTOMY  ~1996   FOOT NEUROMA SURGERY  ~ 2003   right   RE-EXCISION OF BREAST LUMPECTOMY Left 03/03/2020   Procedure: RE-EXCISION MARGINS OF LEFT BREAST LUMPECTOMY;  Surgeon: Manus Rudd, MD;  Location:  SURGERY CENTER;  Service: General;  Laterality: Left;   ROTATOR CUFF REPAIR  ~ 2009   left   SHOULDER ARTHROSCOPY W/ ROTATOR CUFF REPAIR Right 09/21/2011   SKIN CANCER EXCISION     nose, forehead, left leg   TEE WITHOUT CARDIOVERSION N/A 03/01/2017  Procedure: TRANSESOPHAGEAL ECHOCARDIOGRAM (TEE);  Surgeon: Chilton Si, MD;  Location[facility] ENDOSCOPY;  Service: Cardiovascular;  Laterality: N/A;   THYROID LOBECTOMY  ~ 2006   right   TUBAL LIGATION  ~ 1983   VAGINAL HYSTERECTOMY  ~ 2009   VESICOVAGINAL FISTULA CLOSURE W/ TAH     Patient Active Problem List   Diagnosis Date Noted   Biventricular ICD (implantable cardioverter-defibrillator) in place    DOE (dyspnea on exertion) 04/07/2021   Age-related osteoporosis without current pathological fracture 11/29/2020   Atrophy of vagina 11/29/2020   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    Encounter for counseling 09/17/2020   Breast CA (HCC) 02/18/2020   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   Rosacea 11/05/2019   Scar 11/05/2019   Biventricular cardiac pacemaker in situ 10/08/2018   Long term current use of anticoagulant therapy 10/23/2017   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   Trigeminal neuralgia 12/14/2012   Insomnia, persistent 11/03/2011   AICD (automatic cardioverter/defibrillator) present    Aortic regurgitation    Nonischemic cardiomyopathy (HCC) 01/21/2011   Esophagitis, reflux 08/24/2010   Idiopathic peripheral neuropathy 08/24/2010   Chronic systolic CHF (congestive heart failure) (HCC) 02/24/2010   Paroxysmal atrial fibrillation (HCC) 02/23/2010   Hypothyroidism    History of migraine headaches    Left bundle branch block    Scoliosis     Class: Chronic   Lumbar canal stenosis 12/25/2009   Anxiety state 07/24/2008   Avitaminosis D 03/21/2008   Atypical migraine 01/30/2008   Benign essential HTN 01/30/2008   Paroxysmal digital cyanosis 02/15/2007    PCP: Cammy Copa, MD   REFERRING PROVIDER: Cammy Copa, MD  REFERRING DIAG: [phone] (ICD-10-CM) - Arthritis of left shoulder region  THERAPY DIAG:  Chronic left shoulder pain  Muscle weakness (generalized)  Cramp and spasm  Stiffness of left shoulder, not elsewhere classified  Rationale for Evaluation and Treatment: Rehabilitation  ONSET DATE: 06/20/2022  SUBJECTIVE:                                                                                                                                                                                      SUBJECTIVE STATEMENT:  I think the DN helped.  Still a little sore.   I'd like to review the ex routine.  I use a can of black beans to do my ex's.    Eval: Patient reports on going shoulder and neck issues for several months.  She is unable to use her arms overhead without pain.  She is able to do most of her usual  community activities but has felt like cutting back on activities due  to her constant pain. She states she lives with her husband.  Son lives out of town.  She has 2 dogs that she walks daily.  I feel like I just don't have a lot of energy.  I'm on new medication that is an anti-depressant that is supposed to help with bone aches and pain.  I help at the church at times.  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  I still fix supper and clean my own house.  She was a Engineer, site.  Has someone come in to help with heavy duty cleaning once every two weeks.  Hx of spinal surgery/ fusion. Hand dominance: Right  PERTINENT HISTORY: Lumbar fusion to pelvic fusion  PAIN:  Are you having pain? Yes, left shoulder and neck  0/10 currently but 8-9/10 at worst 08/09/22: stiffness today, no pain  PRECAUTIONS: None  RED FLAGS: None   WEIGHT BEARING RESTRICTIONS: No  FALLS:  Has patient fallen in last 6 months? No  LIVING ENVIRONMENT: Lives with: lives with their spouse Lives in: House/apartment   OCCUPATION: Retired  PLOF: Independent, Independent with basic ADLs, Independent with household mobility without device, Independent with community mobility without device, Independent with homemaking with ambulation, Independent with gait, and Independent with transfers  PATIENT GOALS: to be able to use my arms without pain and for my neck to stop hurting  NEXT MD VISIT: prn  OBJECTIVE:   DIAGNOSTIC FINDINGS:  Patient not candidate for MRI due to pacemaker  COGNITION: Overall cognitive status: Within functional limits for tasks assessed     SENSATION: WFL  POSTURE: Rounded shoulders/ forward head  UPPER EXTREMITY ROM:   WFL but with painful arc and end range pain  UPPER EXTREMITY MMT:  Generally 4/5 left UE with exception of ER which is 4-/5  SHOULDER SPECIAL TESTS: Impingement tests: Painful arc test: positive     TODAY'S TREATMENT:        DATE: 08/11/22 Arm bike: level 1x 4 min (2/2)-PT present to discuss progress Cervical A/ROM: 3 ways 10 secondx 3 each Bent over row 10x 1# left Bent over kneeling left shoulder extension 1#  10x Bent over left shoulder horizontal abduction 1# 10x Sidelying external rotation 1# right/left 10x Supine serratus punch 1# right/left 10x Standing red band bil row 15x Standing red band bil shoulder extensions 15x Standing with red band over the top of the door tricep extensions 15x Pt given red band with "handles" for home use (hand OA)  DATE: 08/09/22 Arm bike: level 1x 4 min (2/2)-PT present to discuss progress Cervical A/ROM: 3 ways 10 secondx 3 each Bent over row, horizontal abduction x10 each  Trigger Point Dry-Needling  Treatment instructions: Expect mild to moderate muscle soreness. S/S of pneumothorax if dry needled over a lung field, and to seek immediate medical attention should they occur. Patient verbalized understanding of these instructions and education.  Patient Consent Given: Yes Education handout provided: Previously provided Muscles treated: bil upper traps, lower cervical and thoracic multifidi bil, Rt rhomboids Treatment response/outcome: Utilized skilled palpation to identify trigger points.  During dry needling able to palpate muscle twitch and muscle elongation  Elongation and release to bil neck and scapular region Skilled palpation and monitoring by PT during dry needling  DATE: 08/05/22 Initial eval completed and initiated HEP  PATIENT EDUCATION: Education details: initiated hep Person educated: Patient Education method: Programmer, multimedia, Facilities manager, Verbal cues, and Handouts Education comprehension: verbalized understanding and returned demonstration  HOME EXERCISE PROGRAM: Access Code: [email or web address] URL:  [email or web address] Date: 08/11/2022 Prepared by: Lavinia Sharps  Exercises - Standing Cervical Flexion AROM  - 3-4 x daily - 7 x weekly - 1 sets - 5 reps - 5-10 seconds hold - Standing Cervical Rotation AROM  - 3-4 x daily - 7 x weekly - 1 sets - 5 reps - 5-10seconds hold - Standing Cervical Sidebending AROM  - 3-4 x daily - 7 x weekly - 1 sets - 5 reps - 5-10second hold - Standing Cervical Retraction  - 3-4 x daily - 7 x weekly - 1 sets - 5 reps - 5-10second hold - Single Arm Bent Over Shoulder Extension with Dumbbell  - 1 x daily - 7 x weekly - 3 sets - 10 reps - Bent Over Single Arm Shoulder Row with Dumbbell  - 1 x daily - 7 x weekly - 3 sets - 10 reps - Single Arm Bent Over Shoulder Horizontal Abduction with Dumbbell - Palm Down  - 1 x daily - 7 x weekly - 3 sets - 10 reps - Sidelying Shoulder External Rotation Dumbbell  - 1 x daily - 7 x weekly - 3 sets - 10 reps - Single Arm Serratus Punches in Supine with Dumbbell  - 1 x daily - 7 x weekly - 3 sets - 10 reps - Standing Bilateral Low Shoulder Row with Anchored Resistance  - 1 x daily - 7 x weekly - 2 sets - 10 reps - Shoulder extension with resistance - Neutral  - 1 x daily - 7 x weekly - 2 sets - 10 reps - Standing Elbow Extension with Anchored Resistance  - 1 x daily - 7 x weekly - 2 sets - 10 reps  ASSESSMENT:  CLINICAL IMPRESSION: Patient reports a good initial response to DN last visit and feels she is turning her head better.  She is open to DN next week as soreness subsides.  She demonstrates good carryover with initial HEP with minimal cues to optimize technique.  Therapist progressing and updating HEP for increased intensity and challenge level for further strengthening and functional mobility.      OBJECTIVE IMPAIRMENTS: decreased mobility, decreased ROM, decreased strength, hypomobility, increased fascial restrictions, increased muscle spasms, impaired flexibility, impaired UE functional use, postural  dysfunction, and pain.   ACTIVITY LIMITATIONS: carrying, lifting, sleeping, transfers, bed mobility, bathing, toileting, and dressing  PARTICIPATION LIMITATIONS: meal prep, cleaning, laundry, driving, shopping, and church  PERSONAL FACTORS: Age, Fitness, Past/current experiences, and 1-2 comorbidities: multiple fusions, OA  are also affecting patient's functional outcome.   REHAB POTENTIAL: Good  CLINICAL DECISION MAKING: Stable/uncomplicated  EVALUATION COMPLEXITY: Low  GOALS: Goals reviewed with patient? Yes  SHORT TERM GOALS: Target date: 09/02/2022   Patient will be independent with initial HEP  Baseline: Goal status: INITIAL  2.  Pain report to be no greater than 4/10  Baseline:  Goal status: INITIAL  LONG TERM GOALS: Target date: 09/30/2022   Patient to be independent with advanced HEP  Baseline:  Goal status: INITIAL  2.  Patient to report pain no greater than 2/10  Baseline:  Goal status: INITIAL  3.  Patient to be able to sleep through the night  Baseline:  Goal status: INITIAL  4.  Patient will be able to reach overhead  into cabinets and on top of shelves without pain  Baseline:  Goal status: INITIAL  5.  Patient to be able to stay involved in her church activities Baseline:  Goal status: INITIAL  6.  C spine ROM to improve to [facility] Baseline:  Goal status: INITIAL  PLAN: PT FREQUENCY: 2x/week  PT DURATION: 8 weeks  PLANNED INTERVENTIONS: Therapeutic exercises, Therapeutic activity, Neuromuscular re-education, Balance training, Patient/Family education, Self Care, Joint mobilization, Stair training, DME instructions, Aquatic Therapy, Dry Needling, Electrical stimulation, Spinal manipulation, Cryotherapy, Moist heat, Taping, Vasopneumatic device, Traction, Ultrasound, Ionotophoresis 4mg /ml Dexamethasone, Manual therapy, and Re-evaluation  PLAN FOR NEXT SESSION: continue DN;  Work on [employer] shoulder strength, postural strength, cervical/thoracic mobility;  review red band ex's as needed   Lavinia Sharps, PT 08/11/22 6:41 PM Phone: [phone] Fax: [phone]  [facility] [address] [place], [place] [address] Phone # [phone] Fax [phone]

## 2022-08-17 ENCOUNTER — Ambulatory Visit (HOSPITAL_BASED_OUTPATIENT_CLINIC_OR_DEPARTMENT_OTHER): Payer: Medicare PPO | Admitting: Family Medicine

## 2022-08-17 VITALS — BP 116/55 | HR 50 | Ht 67.5 in | Wt 165.0 lb

## 2022-08-17 DIAGNOSIS — D0512 Intraductal carcinoma in situ of left breast: Secondary | ICD-10-CM

## 2022-08-17 DIAGNOSIS — G43009 Migraine without aura, not intractable, without status migrainosus: Secondary | ICD-10-CM

## 2022-08-17 DIAGNOSIS — E663 Overweight: Secondary | ICD-10-CM

## 2022-08-17 DIAGNOSIS — R202 Paresthesia of skin: Secondary | ICD-10-CM

## 2022-08-17 DIAGNOSIS — D518 Other vitamin B12 deficiency anemias: Secondary | ICD-10-CM | POA: Diagnosis not present

## 2022-08-17 DIAGNOSIS — R200 Anesthesia of skin: Secondary | ICD-10-CM

## 2022-08-17 DIAGNOSIS — R5383 Other fatigue: Secondary | ICD-10-CM | POA: Diagnosis not present

## 2022-08-17 MED ORDER — QULIPTA 60 MG PO TABS: 60.0000 mg | ORAL_TABLET | Freq: Every day | ORAL | 2 refills | Status: DC

## 2022-08-17 NOTE — Progress Notes (Unsigned)
   Established Patient Office Visit  Subjective   Patient ID: [name], female    DOB: Jun 06, 1955  Age: 67 y.o. MRN: [id number]  Chief Complaint  Patient presents with   Migraine    [name] is not working for her, she would like to try dry needling for headaches    [name] is a 67 year-old female patient who presents today for migraine follow-up.  She has a history of frequent migraines and has been using eletriptan 40mg  tablets about 9x per month.  Last visit, switched to Qulipta 10mg  PRN. Did not notice relief with as needed use.   At least, one migraine per day. Thinks it is r/t weather and heat.  Waking up with headache  2-3 days  Nausea & vertigo with migraines  Photophobia with migraines    Dr Estella Husk prior to 2020 Dr. Marcello Moores 2020  Would like to lose weight: 4-5x per week for 30 minutes  Topamax- very forgetful  Amitriptyline- Eletriptan-  Propranolol-  Ajovy did not help with migraine relief   ROS    Objective:     BP (!) 116/55   Pulse (!) 50   Ht 5' 7.5" (1.715 m)   Wt 165 lb (74.8 kg)   SpO2 99%   BMI 25.46 kg/m  BP Readings from Last 3 Encounters:  08/17/22 (!) 116/55  07/20/22 (!) 105/47  03/30/22 (!) 112/53     Physical Exam    Assessment & Plan:  Atypical migraine     No follow-ups on file.    Alyson Reedy, FNP

## 2022-08-18 NOTE — Therapy (Signed)
OUTPATIENT PHYSICAL THERAPY TREATMENT   Patient Name: [name] MRN: [id number] DOB:1955-12-09, 67 y.o., female Today's Date: 08/19/2022  END OF SESSION:  PT End of Session - 08/19/22 0852     Visit Number 4    Date for PT Re-Evaluation 09/30/22    Authorization Type [employer]    Authorization - Visit Number 4    Authorization - Number of Visits 16    Progress Note Due on Visit 10    PT Start Time 0847    PT Stop Time 0939   10 min heat post treatment   PT Time Calculation (min) 52 min    Activity Tolerance Patient tolerated treatment well    Behavior During Therapy [facility] for tasks assessed/performed               Past Medical History:  Diagnosis Date   AF (paroxysmal atrial fibrillation) (HCC)    Arthritis    BCC (basal cell carcinoma) 03/11/2003   left distal lower leg ankle-tx dr Danella Deis   Biventricular cardiac pacemaker in situ    a. prior CRT-D in 2013 with downgrade to CRT-Pacemaker only in 11/2016.   Breast cancer (HCC) 12/12/2019   Chronic systolic CHF (congestive heart failure) (HCC)    Complication of anesthesia    aspiration   Essential tremor    GERD (gastroesophageal reflux disease)    otc   Hypertension    Hypothyroidism    LBBB (left bundle branch block)    conduction disorder   Migraines    Mild aortic insufficiency    NICM (nonischemic cardiomyopathy) (HCC)    a. 2012: normal coronaries, EF 25-30%. b. improved to 55% 11/2016.   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   in situ- right lower leg-anterior ([id number])   Scoliosis    Past Surgical History:  Procedure Laterality Date   ARTERY BIOPSY  12/29/2011   Procedure: MINOR BIOPSY TEMPORAL ARTERY;  Surgeon: Mariella Saa, MD;  Location: [facility];   Service: General;  Laterality: Right;  Right temporal artery biopsy   BACK SURGERY  12/31/2009   "for flat back syndrome; fused bottom of my back"   BI-VENTRICULAR IMPLANTABLE CARDIOVERTER DEFIBRILLATOR Left 01/20/2011   Procedure: BI-VENTRICULAR IMPLANTABLE CARDIOVERTER DEFIBRILLATOR  (CRT-D);  Surgeon: Marinus Maw, MD;  Location: [facility] CATH LAB;  Service: Cardiovascular;  Laterality: Left;   BIV ICD GENERATOR CHANGEOUT N/A 11/24/2016   Procedure: BIV ICD GENERATOR CHANGEOUT;  Surgeon: Marinus Maw, MD;  Location: [facility] INVASIVE CV LAB;  Service: Cardiovascular;  Laterality: N/A;   BREAST LUMPECTOMY WITH RADIOACTIVE SEED LOCALIZATION Left 01/22/2020   Procedure: LEFT BREAST LUMPECTOMY WITH RADIOACTIVE SEED LOCALIZATION;  Surgeon: Manus Rudd, MD;  Location: [facility];  Service: General;  Laterality: Left;  LMA   CHOLECYSTECTOMY  ~1996   FOOT NEUROMA SURGERY  ~ 2003   right   RE-EXCISION OF BREAST LUMPECTOMY Left 03/03/2020   Procedure: RE-EXCISION MARGINS OF LEFT BREAST LUMPECTOMY;  Surgeon: Manus Rudd, MD;  Location: [facility];  Service: General;  Laterality: Left;   ROTATOR CUFF REPAIR  ~ 2009   left   SHOULDER ARTHROSCOPY W/ ROTATOR CUFF REPAIR Right 09/21/2011   SKIN CANCER EXCISION     nose, forehead, left  leg   TEE WITHOUT CARDIOVERSION N/A 03/01/2017   Procedure: TRANSESOPHAGEAL ECHOCARDIOGRAM (TEE);  Surgeon: Chilton Si, MD;  Location: [facility] ENDOSCOPY;  Service: Cardiovascular;  Laterality: N/A;   THYROID LOBECTOMY  ~ 2006   right   TUBAL LIGATION  ~ 1983   VAGINAL HYSTERECTOMY  ~ 2009   VESICOVAGINAL FISTULA CLOSURE W/ TAH     Patient Active Problem List   Diagnosis Date Noted   Biventricular ICD (implantable cardioverter-defibrillator) in place    DOE (dyspnea on exertion) 04/07/2021   Age-related osteoporosis without current pathological fracture 11/29/2020   Atrophy of vagina 11/29/2020   Mild recurrent major depression (HCC)  11/29/2020   Encounter for counseling 09/17/2020   Breast CA (HCC) 02/18/2020   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   Rosacea 11/05/2019   Scar 11/05/2019   Biventricular cardiac pacemaker in situ 10/08/2018   Long term current use of anticoagulant therapy 10/23/2017   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   Trigeminal neuralgia 12/14/2012   Insomnia, persistent 11/03/2011   AICD (automatic cardioverter/defibrillator) present    Aortic regurgitation    Nonischemic cardiomyopathy (HCC) 01/21/2011   Esophagitis, reflux 08/24/2010   Idiopathic peripheral neuropathy 08/24/2010   Chronic systolic CHF (congestive heart failure) (HCC) 02/24/2010   Paroxysmal atrial fibrillation (HCC) 02/23/2010   Hypothyroidism    History of migraine headaches    Left bundle branch block    Scoliosis     Class: Chronic   Lumbar canal stenosis 12/25/2009   Anxiety state 07/24/2008   Avitaminosis D 03/21/2008   Atypical migraine 01/30/2008   Benign essential HTN 01/30/2008   Paroxysmal digital cyanosis 02/15/2007    PCP: Cammy Copa, MD   REFERRING PROVIDER: Cammy Copa, MD  REFERRING DIAG: [phone] (ICD-10-CM) - Arthritis of left shoulder region  THERAPY DIAG:  Chronic left shoulder pain  Muscle weakness (generalized)  Cramp and spasm  Stiffness of left shoulder, not elsewhere classified  Rationale for Evaluation and Treatment: Rehabilitation  ONSET DATE: 06/20/2022  SUBJECTIVE:                                                                                                                                                                                      SUBJECTIVE STATEMENT:  I'm doing fair today.    Eval: Patient reports on going shoulder and neck issues for several months.  She is unable to use her arms overhead without pain.  She is able to do most of her usual community activities but has felt like cutting back on activities due to her constant  pain. She states she lives with her husband.  Son lives out of town.  She has 2 dogs that she walks daily.  I feel like I just don't have a lot of energy.  I'm on new medication that is an anti-depressant that is supposed to help with bone aches and pain.  I help at the church at times.  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  I still fix supper and clean my own house.  She was a Engineer, site.  Has someone come in to help with heavy duty cleaning once every two weeks.  Hx of spinal surgery/ fusion. Hand dominance: Right  PERTINENT HISTORY: Lumbar fusion to pelvic fusion  PAIN:  Are you having pain? Yes, left shoulder and neck  4-5/10 currently but 8-9/10 at worst 08/09/22: stiffness today, no pain  PRECAUTIONS: None  RED FLAGS: None   WEIGHT BEARING RESTRICTIONS: No  FALLS:  Has patient fallen in last 6 months? No  LIVING ENVIRONMENT: Lives with: lives with their spouse Lives in: House/apartment   OCCUPATION: Retired  PLOF: Independent, Independent with basic ADLs, Independent with household mobility without device, Independent with community mobility without device, Independent with homemaking with ambulation, Independent with gait, and Independent with transfers  PATIENT GOALS: to be able to use my arms without pain and for my neck to stop hurting  NEXT MD VISIT: prn  OBJECTIVE:   DIAGNOSTIC FINDINGS:  Patient not candidate for MRI due to pacemaker  COGNITION: Overall cognitive status: Within functional limits for tasks assessed     SENSATION: WFL  POSTURE: Rounded shoulders/ forward head  UPPER EXTREMITY ROM:   WFL but with painful arc and end range pain  UPPER EXTREMITY MMT:  Generally 4/5 left UE with exception of ER which is 4-/5  SHOULDER SPECIAL TESTS: Impingement tests: Painful arc test: positive     TODAY'S TREATMENT:       DATE: 08/19/22 Arm bike: level 1x 4. 5 min (2/2)-PT present to discuss  progress Cervical A/ROM: 3 ways 10 secondx 5 each Cervical retraction x 5  Trigger Point Dry-Needling  Treatment instructions: Expect mild to moderate muscle soreness. S/S of pneumothorax if dry needled over a lung field, and to seek immediate medical attention should they occur. Patient verbalized understanding of these instructions and education. Patient Consent Given: Yes Education handout provided: Previously provided Muscles treated: bil upper traps, lower cervical and  upper thoracic multifidi bil Treatment response/outcome: Utilized skilled palpation to identify trigger points.  During dry needling able to palpate muscle twitch and muscle elongation  STM bil neck and scapular region in sitting  Skilled palpation and monitoring by PT during dry needling                                                                                         08/11/22 Arm bike: level 1x 4 min (2/2)-PT present to discuss progress Cervical A/ROM: 3 ways 10 secondx 3 each Bent over row 10x 1# left Bent over kneeling left shoulder extension 1#  10x Bent over left shoulder horizontal abduction 1# 10x Sidelying external rotation 1# right/left 10x Supine serratus punch 1# right/left 10x Standing red band bil row 15x Standing red  band bil shoulder extensions 15x Standing with red band over the top of the door tricep extensions 15x Pt given red band with "handles" for home use (hand OA)  DATE: 08/09/22 Arm bike: level 1x 4 min (2/2)-PT present to discuss progress Cervical A/ROM: 3 ways 10 secondx 3 each Bent over row, horizontal abduction x10 each  Trigger Point Dry-Needling  Treatment instructions: Expect mild to moderate muscle soreness. S/S of pneumothorax if dry needled over a lung field, and to seek immediate medical attention should they occur. Patient verbalized understanding of these instructions and education. Patient Consent Given: Yes Education handout provided: Previously provided Muscles  treated: bil upper traps, lower cervical and thoracic multifidi bil, Rt rhomboids Treatment response/outcome: Utilized skilled palpation to identify trigger points.  During dry needling able to palpate muscle twitch and muscle elongation  Elongation and release to bil neck and scapular region Skilled palpation and monitoring by PT during dry needling                                                                                                                              DATE: 08/05/22 Initial eval completed and initiated HEP  PATIENT EDUCATION: Education details: initiated hep Person educated: Patient Education method: Programmer, multimedia, Facilities manager, Verbal cues, and Handouts Education comprehension: verbalized understanding and returned demonstration  HOME EXERCISE PROGRAM: Access Code: [email or web address] URL: [email or web address] Date: 08/11/2022 Prepared by: Lavinia Sharps  Exercises - Standing Cervical Flexion AROM  - 3-4 x daily - 7 x weekly - 1 sets - 5 reps - 5-10 seconds hold - Standing Cervical Rotation AROM  - 3-4 x daily - 7 x weekly - 1 sets - 5 reps - 5-10seconds hold - Standing Cervical Sidebending AROM  - 3-4 x daily - 7 x weekly - 1 sets - 5 reps - 5-10second hold - Standing Cervical Retraction  - 3-4 x daily - 7 x weekly - 1 sets - 5 reps - 5-10second hold - Single Arm Bent Over Shoulder Extension with Dumbbell  - 1 x daily - 7 x weekly - 3 sets - 10 reps - Bent Over Single Arm Shoulder Row with Dumbbell  - 1 x daily - 7 x weekly - 3 sets - 10 reps - Single Arm Bent Over Shoulder Horizontal Abduction with Dumbbell - Palm Down  - 1 x daily - 7 x weekly - 3 sets - 10 reps - Sidelying Shoulder External Rotation Dumbbell  - 1 x daily - 7 x weekly - 3 sets - 10 reps - Single Arm Serratus Punches in Supine with Dumbbell  - 1 x daily - 7 x weekly - 3 sets - 10 reps - Standing Bilateral Low Shoulder Row with Anchored Resistance  - 1 x daily - 7 x weekly - 2 sets - 10 reps -  Shoulder extension with resistance - Neutral  - 1 x daily - 7 x weekly - 2 sets - 10 reps - Standing Elbow Extension  with Anchored Resistance  - 1 x daily - 7 x weekly - 2 sets - 10 reps  ASSESSMENT:  CLINICAL IMPRESSION: Domonique presents today reporting she is doing fair, but has marked tension and tenderness in B UT. She tolerated DN/MT fair but requested that next time we try positioning her in sitting. She had excellent twitch responses B. She reports ongoing pain with functional activities like loading the diswasher due to leaning forward. Merlean continues to demonstrate potential for improvement and would benefit from continued skilled therapy to address impairments.     OBJECTIVE IMPAIRMENTS: decreased mobility, decreased ROM, decreased strength, hypomobility, increased fascial restrictions, increased muscle spasms, impaired flexibility, impaired UE functional use, postural dysfunction, and pain.   ACTIVITY LIMITATIONS: carrying, lifting, sleeping, transfers, bed mobility, bathing, toileting, and dressing  PARTICIPATION LIMITATIONS: meal prep, cleaning, laundry, driving, shopping, and church  PERSONAL FACTORS: Age, Fitness, Past/current experiences, and 1-2 comorbidities: multiple fusions, OA  are also affecting patient's functional outcome.   REHAB POTENTIAL: Good  CLINICAL DECISION MAKING: Stable/uncomplicated  EVALUATION COMPLEXITY: Low  GOALS: Goals reviewed with patient? Yes  SHORT TERM GOALS: Target date: 09/02/2022   Patient will be independent with initial HEP  Baseline: Goal status: INITIAL  2.  Pain report to be no greater than 4/10  Baseline:  Goal status: INITIAL  LONG TERM GOALS: Target date: 09/30/2022   Patient to be independent with advanced HEP  Baseline:  Goal status: INITIAL  2.  Patient to report pain no greater than 2/10  Baseline:  Goal status: INITIAL  3.  Patient to be able to sleep through the night  Baseline:  Goal status: INITIAL  4.   Patient will be able to reach overhead into cabinets and on top of shelves without pain  Baseline:  Goal status: INITIAL  5.  Patient to be able to stay involved in her church activities Baseline:  Goal status: INITIAL  6.  C spine ROM to improve to [facility] Baseline:  Goal status: INITIAL  PLAN: PT FREQUENCY: 2x/week  PT DURATION: 8 weeks  PLANNED INTERVENTIONS: Therapeutic exercises, Therapeutic activity, Neuromuscular re-education, Balance training, Patient/Family education, Self Care, Joint mobilization, Stair training, DME instructions, Aquatic Therapy, Dry Needling, Electrical stimulation, Spinal manipulation, Cryotherapy, Moist heat, Taping, Vasopneumatic device, Traction, Ultrasound, Ionotophoresis 4mg /ml Dexamethasone, Manual therapy, and Re-evaluation  PLAN FOR NEXT SESSION: continue DN;  Work on [employer] shoulder strength, postural strength, cervical/thoracic mobility; review red band ex's as needed   Solon Palm, PT  08/19/22 9:30 AM Phone: [phone] Fax: [phone]  [facility] [address], [address] [place], [place] [address] Phone # [phone] Fax [phone]

## 2022-08-19 ENCOUNTER — Encounter: Payer: Self-pay | Admitting: Physical Therapy

## 2022-08-19 ENCOUNTER — Ambulatory Visit: Payer: Medicare PPO | Admitting: Physical Therapy

## 2022-08-19 DIAGNOSIS — R252 Cramp and spasm: Secondary | ICD-10-CM

## 2022-08-19 DIAGNOSIS — M25512 Pain in left shoulder: Secondary | ICD-10-CM | POA: Diagnosis not present

## 2022-08-19 DIAGNOSIS — M6281 Muscle weakness (generalized): Secondary | ICD-10-CM

## 2022-08-19 DIAGNOSIS — G8929 Other chronic pain: Secondary | ICD-10-CM

## 2022-08-19 DIAGNOSIS — M25612 Stiffness of left shoulder, not elsewhere classified: Secondary | ICD-10-CM

## 2022-08-22 ENCOUNTER — Other Ambulatory Visit (HOSPITAL_BASED_OUTPATIENT_CLINIC_OR_DEPARTMENT_OTHER): Payer: Self-pay

## 2022-08-22 DIAGNOSIS — G43009 Migraine without aura, not intractable, without status migrainosus: Secondary | ICD-10-CM

## 2022-08-22 MED ORDER — QULIPTA 60 MG PO TABS
60.0000 mg | ORAL_TABLET | Freq: Every day | ORAL | 2 refills | Status: AC
  Filled 2022-08-22: qty 30, 30d supply, fill #0
  Filled 2022-09-17: qty 30, 30d supply, fill #1
  Filled 2022-11-02: qty 30, 30d supply, fill #2

## 2022-08-23 ENCOUNTER — Ambulatory Visit (INDEPENDENT_AMBULATORY_CARE_PROVIDER_SITE_OTHER): Payer: Medicare PPO

## 2022-08-23 DIAGNOSIS — I428 Other cardiomyopathies: Secondary | ICD-10-CM | POA: Diagnosis not present

## 2022-08-23 LAB — CUP PACEART REMOTE DEVICE CHECK
Battery Remaining Longevity: 47 mo
Battery Voltage: 2.94 V
Brady Statistic AP VP Percent: 3.6 %
Brady Statistic AP VS Percent: 0.1 %
Brady Statistic AS VP Percent: 94.52 %
Brady Statistic AS VS Percent: 1.78 %
Brady Statistic RA Percent Paced: 3.71 %
Brady Statistic RV Percent Paced: 0.56 %
Date Time Interrogation Session: 20240813095609
Implantable Lead Connection Status: 753985
Implantable Lead Connection Status: 753985
Implantable Lead Connection Status: 753985
Implantable Lead Implant Date: 20130110
Implantable Lead Implant Date: 20130110
Implantable Lead Implant Date: 20130110
Implantable Lead Location: 753858
Implantable Lead Location: 753859
Implantable Lead Location: 753860
Implantable Lead Model: 4196
Implantable Lead Model: 5076
Implantable Lead Model: 6935
Implantable Pulse Generator Implant Date: 20181115
Lead Channel Impedance Value: 1216 Ohm
Lead Channel Impedance Value: 342 Ohm
Lead Channel Impedance Value: 399 Ohm
Lead Channel Impedance Value: 608 Ohm
Lead Channel Impedance Value: 703 Ohm
Lead Channel Impedance Value: 722 Ohm
Lead Channel Impedance Value: 817 Ohm
Lead Channel Impedance Value: 874 Ohm
Lead Channel Impedance Value: 912 Ohm
Lead Channel Pacing Threshold Amplitude: 0.625 V
Lead Channel Pacing Threshold Amplitude: 1.5 V
Lead Channel Pacing Threshold Amplitude: 2.375 V
Lead Channel Pacing Threshold Pulse Width: 0.4 ms
Lead Channel Pacing Threshold Pulse Width: 0.4 ms
Lead Channel Pacing Threshold Pulse Width: 0.8 ms
Lead Channel Sensing Intrinsic Amplitude: 0.75 mV
Lead Channel Sensing Intrinsic Amplitude: 0.75 mV
Lead Channel Sensing Intrinsic Amplitude: 16.5 mV
Lead Channel Sensing Intrinsic Amplitude: 16.5 mV
Lead Channel Setting Pacing Amplitude: 2 V
Lead Channel Setting Pacing Amplitude: 2.75 V
Lead Channel Setting Pacing Amplitude: 4 V
Lead Channel Setting Pacing Pulse Width: 0.8 ms
Lead Channel Setting Pacing Pulse Width: 1 ms
Lead Channel Setting Sensing Sensitivity: 0.9 mV
Zone Setting Status: 755011
Zone Setting Status: 755011

## 2022-08-30 ENCOUNTER — Encounter: Payer: Self-pay | Admitting: Neurology

## 2022-08-31 ENCOUNTER — Other Ambulatory Visit: Payer: Self-pay | Admitting: Nurse Practitioner

## 2022-08-31 ENCOUNTER — Other Ambulatory Visit: Payer: Self-pay | Admitting: *Deleted

## 2022-08-31 DIAGNOSIS — M810 Age-related osteoporosis without current pathological fracture: Secondary | ICD-10-CM

## 2022-09-01 ENCOUNTER — Inpatient Hospital Stay: Payer: Medicare PPO

## 2022-09-01 ENCOUNTER — Inpatient Hospital Stay: Payer: Medicare PPO | Attending: Oncology

## 2022-09-01 VITALS — BP 125/58 | HR 52 | Temp 98.2°F | Resp 18 | Ht 67.0 in | Wt 165.4 lb

## 2022-09-01 DIAGNOSIS — M810 Age-related osteoporosis without current pathological fracture: Secondary | ICD-10-CM

## 2022-09-01 DIAGNOSIS — D0512 Intraductal carcinoma in situ of left breast: Secondary | ICD-10-CM

## 2022-09-01 LAB — BASIC METABOLIC PANEL - CANCER CENTER ONLY
Anion gap: 8 (ref 5–15)
BUN: 21 mg/dL (ref 8–23)
CO2: 26 mmol/L (ref 22–32)
Calcium: 9.1 mg/dL (ref 8.9–10.3)
Chloride: 106 mmol/L (ref 98–111)
Creatinine: 0.76 mg/dL (ref 0.44–1.00)
GFR, Estimated: >60 mL/min (ref >60)
Glucose, Bld: 93 mg/dL (ref 70–99)
Potassium: 4.1 mmol/L (ref 3.5–5.1)
Sodium: 140 mmol/L (ref 135–145)

## 2022-09-01 MED ORDER — SODIUM CHLORIDE 0.9 % IV SOLN: Freq: Once | INTRAVENOUS | Status: AC

## 2022-09-01 MED ORDER — ZOLEDRONIC ACID 4 MG/100ML IV SOLN: 4.0000 mg | Freq: Once | INTRAVENOUS | Status: DC

## 2022-09-01 MED ORDER — ZOLEDRONIC ACID 4 MG/100ML IV SOLN
4.0000 mg | Freq: Once | INTRAVENOUS | Status: AC
  Administered 2022-09-01: 4 mg via INTRAVENOUS
  Filled 2022-09-01: qty 100

## 2022-09-01 NOTE — Patient Instructions (Signed)

## 2022-09-02 ENCOUNTER — Ambulatory Visit: Payer: Medicare PPO | Admitting: Physical Therapy

## 2022-09-02 DIAGNOSIS — M6281 Muscle weakness (generalized): Secondary | ICD-10-CM

## 2022-09-02 DIAGNOSIS — R252 Cramp and spasm: Secondary | ICD-10-CM

## 2022-09-02 DIAGNOSIS — G8929 Other chronic pain: Secondary | ICD-10-CM

## 2022-09-02 DIAGNOSIS — M25512 Pain in left shoulder: Secondary | ICD-10-CM | POA: Diagnosis not present

## 2022-09-02 NOTE — Therapy (Signed)
OUTPATIENT PHYSICAL THERAPY TREATMENT   Patient Name: [name] MRN: [id number] DOB:05/17/1955, 67 y.o., female Today's Date: 09/02/2022  END OF SESSION:  PT End of Session - 09/02/22 0809     Visit Number 5    Date for PT Re-Evaluation 09/30/22    Authorization Type [employer]    Authorization Time Period 16 visits. 08/05/2022-09/30/2022    Authorization - Visit Number 5    Authorization - Number of Visits 16    Progress Note Due on Visit 10    PT Start Time 0807    PT Stop Time 0845    PT Time Calculation (min) 38 min    Activity Tolerance Patient tolerated treatment well               Past Medical History:  Diagnosis Date   AF (paroxysmal atrial fibrillation) (HCC)    Arthritis    BCC (basal cell carcinoma) 03/11/2003   left distal lower leg ankle-tx dr Danella Deis   Biventricular cardiac pacemaker in situ    a. prior CRT-D in 2013 with downgrade to CRT-Pacemaker only in 11/2016.   Breast cancer (HCC) 12/12/2019   Chronic systolic CHF (congestive heart failure) (HCC)    Complication of anesthesia    aspiration   Essential tremor    GERD (gastroesophageal reflux disease)    otc   Hypertension    Hypothyroidism    LBBB (left bundle branch block)    conduction disorder   Migraines    Mild aortic insufficiency    NICM (nonischemic cardiomyopathy) (HCC)    a. 2012: normal coronaries, EF 25-30%. b. improved to 55% 11/2016.   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   in situ- right lower leg-anterior ([id number])   Scoliosis    Past Surgical History:  Procedure Laterality Date   ARTERY BIOPSY  12/29/2011   Procedure: MINOR BIOPSY TEMPORAL ARTERY;  Surgeon: Mariella Saa, MD;  Location: [facility];  Service: General;   Laterality: Right;  Right temporal artery biopsy   BACK SURGERY  12/31/2009   "for flat back syndrome; fused bottom of my back"   BI-VENTRICULAR IMPLANTABLE CARDIOVERTER DEFIBRILLATOR Left 01/20/2011   Procedure: BI-VENTRICULAR IMPLANTABLE CARDIOVERTER DEFIBRILLATOR  (CRT-D);  Surgeon: Marinus Maw, MD;  Location: [facility] CATH LAB;  Service: Cardiovascular;  Laterality: Left;   BIV ICD GENERATOR CHANGEOUT N/A 11/24/2016   Procedure: BIV ICD GENERATOR CHANGEOUT;  Surgeon: Marinus Maw, MD;  Location: [facility] INVASIVE CV LAB;  Service: Cardiovascular;  Laterality: N/A;   BREAST LUMPECTOMY WITH RADIOACTIVE SEED LOCALIZATION Left 01/22/2020   Procedure: LEFT BREAST LUMPECTOMY WITH RADIOACTIVE SEED LOCALIZATION;  Surgeon: Manus Rudd, MD;  Location: [facility];  Service: General;  Laterality: Left;  LMA   CHOLECYSTECTOMY  ~1996   FOOT NEUROMA SURGERY  ~ 2003   right   RE-EXCISION OF BREAST LUMPECTOMY Left 03/03/2020   Procedure: RE-EXCISION MARGINS OF LEFT BREAST LUMPECTOMY;  Surgeon: Manus Rudd, MD;  Location: [facility];  Service: General;  Laterality: Left;   ROTATOR CUFF REPAIR  ~ 2009   left   SHOULDER ARTHROSCOPY W/ ROTATOR CUFF REPAIR Right 09/21/2011   SKIN CANCER EXCISION     nose, forehead, left leg   TEE WITHOUT CARDIOVERSION N/A  03/01/2017   Procedure: TRANSESOPHAGEAL ECHOCARDIOGRAM (TEE);  Surgeon: Chilton Si, MD;  Location: [facility] ENDOSCOPY;  Service: Cardiovascular;  Laterality: N/A;   THYROID LOBECTOMY  ~ 2006   right   TUBAL LIGATION  ~ 1983   VAGINAL HYSTERECTOMY  ~ 2009   VESICOVAGINAL FISTULA CLOSURE W/ TAH     Patient Active Problem List   Diagnosis Date Noted   Biventricular ICD (implantable cardioverter-defibrillator) in place    DOE (dyspnea on exertion) 04/07/2021   Age-related osteoporosis without current pathological fracture 11/29/2020   Atrophy of vagina 11/29/2020   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    Encounter for counseling 09/17/2020   Breast CA (HCC) 02/18/2020   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   Rosacea 11/05/2019   Scar 11/05/2019   Biventricular cardiac pacemaker in situ 10/08/2018   Long term current use of anticoagulant therapy 10/23/2017   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   Trigeminal neuralgia 12/14/2012   Insomnia, persistent 11/03/2011   AICD (automatic cardioverter/defibrillator) present    Aortic regurgitation    Nonischemic cardiomyopathy (HCC) 01/21/2011   Esophagitis, reflux 08/24/2010   Idiopathic peripheral neuropathy 08/24/2010   Chronic systolic CHF (congestive heart failure) (HCC) 02/24/2010   Paroxysmal atrial fibrillation (HCC) 02/23/2010   Hypothyroidism    History of migraine headaches    Left bundle branch block    Scoliosis     Class: Chronic   Lumbar canal stenosis 12/25/2009   Anxiety state 07/24/2008   Avitaminosis D 03/21/2008   Atypical migraine 01/30/2008   Benign essential HTN 01/30/2008   Paroxysmal digital cyanosis 02/15/2007    PCP: Cammy Copa, MD   REFERRING PROVIDER: Cammy Copa, MD  REFERRING DIAG: [phone] (ICD-10-CM) - Arthritis of left shoulder region  THERAPY DIAG:  Chronic left shoulder pain  Muscle weakness (generalized)  Cramp and spasm  Rationale for Evaluation and Treatment: Rehabilitation  ONSET DATE: 06/20/2022  SUBJECTIVE:                                                                                                                                                                                      SUBJECTIVE STATEMENT: The DN really hurt last time.  I wish the DN helped for a longer period of time.  I've been out of town helping my bedridden sister so I've slacked off on ex's.   Eval: Patient reports on going shoulder and neck issues for several months.  She is unable to use her arms overhead without pain.  She is able to do most of her usual community activities  but has felt like cutting back on activities due to her constant pain.  She states she lives with her husband.  Son lives out of town.  She has 2 dogs that she walks daily.  I feel like I just don't have a lot of energy.  I'm on new medication that is an anti-depressant that is supposed to help with bone aches and pain.  I help at the church at times.  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  I still fix supper and clean my own house.  She was a Engineer, site.  Has someone come in to help with heavy duty cleaning once every two weeks.  Hx of spinal surgery/ fusion. Hand dominance: Right  PERTINENT HISTORY: Lumbar fusion to pelvic fusion  PAIN:  Are you having pain? Yes, left  and right neck 6/10 and left shoulder cracks   PRECAUTIONS: None  RED FLAGS: None   WEIGHT BEARING RESTRICTIONS: No  FALLS:  Has patient fallen in last 6 months? No  LIVING ENVIRONMENT: Lives with: lives with their spouse Lives in: House/apartment   OCCUPATION: Retired  PLOF: Independent, Independent with basic ADLs, Independent with household mobility without device, Independent with community mobility without device, Independent with homemaking with ambulation, Independent with gait, and Independent with transfers  PATIENT GOALS: to be able to use my arms without pain and for my neck to stop hurting  NEXT MD VISIT: prn  OBJECTIVE:   DIAGNOSTIC FINDINGS:  Patient not candidate for MRI due to pacemaker  COGNITION: Overall cognitive status: Within functional limits for tasks assessed     SENSATION: WFL  POSTURE: Rounded shoulders/ forward head  UPPER EXTREMITY ROM:   WFL but with painful arc and end range pain  UPPER EXTREMITY MMT:  Generally 4/5 left UE with exception of ER which is 4-/5  SHOULDER SPECIAL TESTS: Impingement tests: Painful arc test: positive     TODAY'S TREATMENT:       DATE: 09/02/22 Discussion on the need for ex's to  compliment DN and for best long term carryover Review of HEP band ex's:  red band with handles row 15x; bil shoulder extension 15x; single arm tricep extensions 15x right/left 4# bicep curls 10x right/left  Counter push ups 12x Discussed sit to stand ex for general strengthening Manual therapy to cervical musculature bil  Trigger Point Dry-Needling  Treatment instructions: Expect mild to moderate muscle soreness. S/S of pneumothorax if dry needled over a lung field, and to seek immediate medical attention should they occur. Patient verbalized understanding of these instructions and education. Patient Consent Given: Yes Education handout provided: Previously provided Muscles treated: bil upper traps, levator scap (less pistoning secondary to inc pain with DN last visit) Treatment response/outcome: Utilized skilled palpation to identify trigger points.  During dry needling able to palpate muscle twitch and muscle elongation  Skilled palpation and monitoring by PT during dry needling                                                                     08/19/22 Arm bike: level 1x 4. 5 min (2/2)-PT present to discuss progress Cervical A/ROM: 3 ways 10 secondx 5 each Cervical retraction x 5  Trigger Point Dry-Needling  Treatment instructions: Expect mild to moderate muscle soreness. S/S of pneumothorax if  dry needled over a lung field, and to seek immediate medical attention should they occur. Patient verbalized understanding of these instructions and education. Patient Consent Given: Yes Education handout provided: Previously provided Muscles treated: bil upper traps, lower cervical and  upper thoracic multifidi bil Treatment response/outcome: Utilized skilled palpation to identify trigger points.  During dry needling able to palpate muscle twitch and muscle elongation  STM bil neck and scapular region in sitting  Skilled palpation and monitoring by PT during dry needling                                                                                          08/11/22 Arm bike: level 1x 4 min (2/2)-PT present to discuss progress Cervical A/ROM: 3 ways 10 secondx 3 each Bent over row 10x 1# left Bent over kneeling left shoulder extension 1#  10x Bent over left shoulder horizontal abduction 1# 10x Sidelying external rotation 1# right/left 10x Supine serratus punch 1# right/left 10x Standing red band bil row 15x Standing red band bil shoulder extensions 15x Standing with red band over the top of the door tricep extensions 15x Pt given red band with "handles" for home use (hand OA)  DATE: 08/09/22 Arm bike: level 1x 4 min (2/2)-PT present to discuss progress Cervical A/ROM: 3 ways 10 secondx 3 each Bent over row, horizontal abduction x10 each  Trigger Point Dry-Needling  Treatment instructions: Expect mild to moderate muscle soreness. S/S of pneumothorax if dry needled over a lung field, and to seek immediate medical attention should they occur. Patient verbalized understanding of these instructions and education. Patient Consent Given: Yes Education handout provided: Previously provided Muscles treated: bil upper traps, lower cervical and thoracic multifidi bil, Rt rhomboids Treatment response/outcome: Utilized skilled palpation to identify trigger points.  During dry needling able to palpate muscle twitch and muscle elongation  Elongation and release to bil neck and scapular region Skilled palpation and monitoring by PT during dry needling                                                                                                                               PATIENT EDUCATION: Education details: initiated hep Person educated: Patient Education method: Programmer, multimedia, Facilities manager, Verbal cues, and Handouts Education comprehension: verbalized understanding and returned demonstration  HOME EXERCISE PROGRAM: Access Code: [email or web address] URL: [email or web address] Date:  08/11/2022 Prepared by: Lavinia Sharps  Exercises - Standing Cervical Flexion AROM  - 3-4 x daily - 7 x weekly - 1 sets - 5 reps - 5-10 seconds hold - Standing Cervical Rotation AROM  - 3-4 x daily -  7 x weekly - 1 sets - 5 reps - 5-10seconds hold - Standing Cervical Sidebending AROM  - 3-4 x daily - 7 x weekly - 1 sets - 5 reps - 5-10second hold - Standing Cervical Retraction  - 3-4 x daily - 7 x weekly - 1 sets - 5 reps - 5-10second hold - Single Arm Bent Over Shoulder Extension with Dumbbell  - 1 x daily - 7 x weekly - 3 sets - 10 reps - Bent Over Single Arm Shoulder Row with Dumbbell  - 1 x daily - 7 x weekly - 3 sets - 10 reps - Single Arm Bent Over Shoulder Horizontal Abduction with Dumbbell - Palm Down  - 1 x daily - 7 x weekly - 3 sets - 10 reps - Sidelying Shoulder External Rotation Dumbbell  - 1 x daily - 7 x weekly - 3 sets - 10 reps - Single Arm Serratus Punches in Supine with Dumbbell  - 1 x daily - 7 x weekly - 3 sets - 10 reps - Standing Bilateral Low Shoulder Row with Anchored Resistance  - 1 x daily - 7 x weekly - 2 sets - 10 reps - Shoulder extension with resistance - Neutral  - 1 x daily - 7 x weekly - 2 sets - 10 reps - Standing Elbow Extension with Anchored Resistance  - 1 x daily - 7 x weekly - 2 sets - 10 reps  ASSESSMENT:  CLINICAL IMPRESSION: The patient is able to demonstrate resisted band ex's with mod verbal cues.  Trial of counter push ups and bicep curls with weight without an exacerbation of pain.   Although she reports DN was quite painful last time she is receptive to trying it again.  She tolerated it well today with decreased tender points following in upper traps.  The patient was encouraged in regular performance of HEP post DN including soft tissue lengthening and strengthening exercises to enhance long term benefit.    OBJECTIVE IMPAIRMENTS: decreased mobility, decreased ROM, decreased strength, hypomobility, increased fascial restrictions, increased  muscle spasms, impaired flexibility, impaired UE functional use, postural dysfunction, and pain.   ACTIVITY LIMITATIONS: carrying, lifting, sleeping, transfers, bed mobility, bathing, toileting, and dressing  PARTICIPATION LIMITATIONS: meal prep, cleaning, laundry, driving, shopping, and church  PERSONAL FACTORS: Age, Fitness, Past/current experiences, and 1-2 comorbidities: multiple fusions, OA  are also affecting patient's functional outcome.   REHAB POTENTIAL: Good  CLINICAL DECISION MAKING: Stable/uncomplicated  EVALUATION COMPLEXITY: Low  GOALS: Goals reviewed with patient? Yes  SHORT TERM GOALS: Target date: 09/02/2022   Patient will be independent with initial HEP  Baseline: Goal status: partially met   2.  Pain report to be no greater than 4/10  Baseline:  Goal status: ongoing  LONG TERM GOALS: Target date: 09/30/2022   Patient to be independent with advanced HEP  Baseline:  Goal status: INITIAL  2.  Patient to report pain no greater than 2/10  Baseline:  Goal status: INITIAL  3.  Patient to be able to sleep through the night  Baseline:  Goal status: INITIAL  4.  Patient will be able to reach overhead into cabinets and on top of shelves without pain  Baseline:  Goal status: INITIAL  5.  Patient to be able to stay involved in her church activities Baseline:  Goal status: INITIAL  6.  C spine ROM to improve to [facility] Baseline:  Goal status: INITIAL  PLAN: PT FREQUENCY: 2x/week  PT DURATION: 8 weeks  PLANNED INTERVENTIONS:  Therapeutic exercises, Therapeutic activity, Neuromuscular re-education, Balance training, Patient/Family education, Self Care, Joint mobilization, Stair training, DME instructions, Aquatic Therapy, Dry Needling, Electrical stimulation, Spinal manipulation, Cryotherapy, Moist heat, Taping, Vasopneumatic device, Traction, Ultrasound, Ionotophoresis 4mg /ml Dexamethasone, Manual therapy, and Re-evaluation  PLAN FOR NEXT SESSION: continue  DN with minimal sites or less pistoning;  Work on [employer], postural strength, cervical/thoracic mobility   Lavinia Sharps, PT 09/02/22 11:56 AM Phone: [phone] Fax: [phone]  [facility] [address] [place], [place] [address] Phone # [phone] Fax [phone]

## 2022-09-05 ENCOUNTER — Other Ambulatory Visit (HOSPITAL_BASED_OUTPATIENT_CLINIC_OR_DEPARTMENT_OTHER): Payer: Self-pay | Admitting: Family Medicine

## 2022-09-06 ENCOUNTER — Ambulatory Visit: Payer: Medicare PPO

## 2022-09-06 MED ORDER — DULOXETINE HCL 60 MG PO CPEP: 60.0000 mg | ORAL_CAPSULE | Freq: Every day | ORAL | 0 refills | Status: DC

## 2022-09-07 ENCOUNTER — Ambulatory Visit: Payer: Medicare PPO

## 2022-09-07 NOTE — Progress Notes (Signed)
Remote pacemaker transmission.   

## 2022-09-08 ENCOUNTER — Ambulatory Visit: Payer: Medicare PPO

## 2022-09-13 ENCOUNTER — Ambulatory Visit: Payer: Medicare PPO | Attending: Orthopedic Surgery

## 2022-09-13 DIAGNOSIS — M25612 Stiffness of left shoulder, not elsewhere classified: Secondary | ICD-10-CM | POA: Insufficient documentation

## 2022-09-13 DIAGNOSIS — R252 Cramp and spasm: Secondary | ICD-10-CM | POA: Insufficient documentation

## 2022-09-13 DIAGNOSIS — M6281 Muscle weakness (generalized): Secondary | ICD-10-CM | POA: Diagnosis present

## 2022-09-13 DIAGNOSIS — M25512 Pain in left shoulder: Secondary | ICD-10-CM | POA: Diagnosis present

## 2022-09-13 DIAGNOSIS — R293 Abnormal posture: Secondary | ICD-10-CM | POA: Insufficient documentation

## 2022-09-13 DIAGNOSIS — G8929 Other chronic pain: Secondary | ICD-10-CM | POA: Insufficient documentation

## 2022-09-13 NOTE — Therapy (Signed)
OUTPATIENT PHYSICAL THERAPY TREATMENT   Patient Name: [name] MRN: [id number] DOB:09-08-1955, 67 y.o., female Today's Date: 09/13/2022  END OF SESSION:  PT End of Session - 09/13/22 1200     Visit Number 6    Date for PT Re-Evaluation 09/30/22    Authorization Type [employer]    Authorization Time Period 16 visits. 08/05/2022-09/30/2022    Authorization - Visit Number 6    Authorization - Number of Visits 16    Progress Note Due on Visit 10    PT Start Time 1100    PT Stop Time 1147    PT Time Calculation (min) 47 min    Activity Tolerance Patient tolerated treatment well    Behavior During Therapy WFL for tasks assessed/performed                Past Medical History:  Diagnosis Date   AF (paroxysmal atrial fibrillation) (HCC)    Arthritis    BCC (basal cell carcinoma) 03/11/2003   left distal lower leg ankle-tx dr Danella Deis   Biventricular cardiac pacemaker in situ    a. prior CRT-D in 2013 with downgrade to CRT-Pacemaker only in 11/2016.   Breast cancer (HCC) 12/12/2019   Chronic systolic CHF (congestive heart failure) (HCC)    Complication of anesthesia    aspiration   Essential tremor    GERD (gastroesophageal reflux disease)    otc   Hypertension    Hypothyroidism    LBBB (left bundle branch block)    conduction disorder   Migraines    Mild aortic insufficiency    NICM (nonischemic cardiomyopathy) (HCC)    a. 2012: normal coronaries, EF 25-30%. b. improved to 55% 11/2016.   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   in situ- right lower leg-anterior ([id number])   Scoliosis    Past Surgical History:  Procedure Laterality Date   ARTERY BIOPSY  12/29/2011   Procedure: MINOR BIOPSY TEMPORAL ARTERY;  Surgeon: Mariella Saa, MD;   Location: [facility];  Service: General;  Laterality: Right;  Right temporal artery biopsy   BACK SURGERY  12/31/2009   "for flat back syndrome; fused bottom of my back"   BI-VENTRICULAR IMPLANTABLE CARDIOVERTER DEFIBRILLATOR Left 01/20/2011   Procedure: BI-VENTRICULAR IMPLANTABLE CARDIOVERTER DEFIBRILLATOR  (CRT-D);  Surgeon: Marinus Maw, MD;  Location: [facility] CATH LAB;  Service: Cardiovascular;  Laterality: Left;   BIV ICD GENERATOR CHANGEOUT N/A 11/24/2016   Procedure: BIV ICD GENERATOR CHANGEOUT;  Surgeon: Marinus Maw, MD;  Location: [facility] INVASIVE CV LAB;  Service: Cardiovascular;  Laterality: N/A;   BREAST LUMPECTOMY WITH RADIOACTIVE SEED LOCALIZATION Left 01/22/2020   Procedure: LEFT BREAST LUMPECTOMY WITH RADIOACTIVE SEED LOCALIZATION;  Surgeon: Manus Rudd, MD;  Location: [facility];  Service: General;  Laterality: Left;  LMA   CHOLECYSTECTOMY  ~1996   FOOT NEUROMA SURGERY  ~ 2003   right   RE-EXCISION OF BREAST LUMPECTOMY Left 03/03/2020   Procedure: RE-EXCISION MARGINS OF LEFT BREAST LUMPECTOMY;  Surgeon: Manus Rudd, MD;  Location:  SURGERY CENTER;  Service: General;  Laterality: Left;   ROTATOR CUFF REPAIR  ~ 2009   left   SHOULDER ARTHROSCOPY W/ ROTATOR CUFF REPAIR Right 09/21/2011   SKIN CANCER EXCISION  nose, forehead, left leg   TEE WITHOUT CARDIOVERSION N/A 03/01/2017   Procedure: TRANSESOPHAGEAL ECHOCARDIOGRAM (TEE);  Surgeon: Chilton Si, MD;  Location: [facility] ENDOSCOPY;  Service: Cardiovascular;  Laterality: N/A;   THYROID LOBECTOMY  ~ 2006   right   TUBAL LIGATION  ~ 1983   VAGINAL HYSTERECTOMY  ~ 2009   VESICOVAGINAL FISTULA CLOSURE W/ TAH     Patient Active Problem List   Diagnosis Date Noted   Biventricular ICD (implantable cardioverter-defibrillator) in place    DOE (dyspnea on exertion) 04/07/2021   Age-related osteoporosis without current pathological fracture 11/29/2020   Atrophy of vagina 11/29/2020    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   Encounter for counseling 09/17/2020   Breast CA (HCC) 02/18/2020   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   Rosacea 11/05/2019   Scar 11/05/2019   Biventricular cardiac pacemaker in situ 10/08/2018   Long term current use of anticoagulant therapy 10/23/2017   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   Trigeminal neuralgia 12/14/2012   Insomnia, persistent 11/03/2011   AICD (automatic cardioverter/defibrillator) present    Aortic regurgitation    Nonischemic cardiomyopathy (HCC) 01/21/2011   Esophagitis, reflux 08/24/2010   Idiopathic peripheral neuropathy 08/24/2010   Chronic systolic CHF (congestive heart failure) (HCC) 02/24/2010   Paroxysmal atrial fibrillation (HCC) 02/23/2010   Hypothyroidism    History of migraine headaches    Left bundle branch block    Scoliosis     Class: Chronic   Lumbar canal stenosis 12/25/2009   Anxiety state 07/24/2008   Avitaminosis D 03/21/2008   Atypical migraine 01/30/2008   Benign essential HTN 01/30/2008   Paroxysmal digital cyanosis 02/15/2007    PCP: Cammy Copa, MD   REFERRING PROVIDER: Cammy Copa, MD  REFERRING DIAG: [phone] (ICD-10-CM) - Arthritis of left shoulder region  THERAPY DIAG:  Chronic left shoulder pain  Cramp and spasm  Muscle weakness (generalized)  Stiffness of left shoulder, not elsewhere classified  Rationale for Evaluation and Treatment: Rehabilitation  ONSET DATE: 06/20/2022  SUBJECTIVE:                                                                                                                                                                                      SUBJECTIVE STATEMENT: Patient reports that her shoulder still hurts but her neck is hurting even worse.     Eval: Patient reports on going shoulder and neck issues for several months.  She is unable to use her arms overhead without pain.  She is able to do most of  her usual community activities but has felt like cutting back on activities due to her constant pain.  She states she lives with her husband.  Son lives out of town.  She has 2 dogs that she walks daily.  I feel like I just don't have a lot of energy.  I'm on new medication that is an anti-depressant that is supposed to help with bone aches and pain.  I help at the church at times.  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  I still fix supper and clean my own house.  She was a Engineer, site.  Has someone come in to help with heavy duty cleaning once every two weeks.  Hx of spinal surgery/ fusion. Hand dominance: Right  PERTINENT HISTORY: Lumbar fusion to pelvic fusion  PAIN:  Are you having pain? Yes, left  and right neck 6/10 and left shoulder cracks   PRECAUTIONS: None  RED FLAGS: None   WEIGHT BEARING RESTRICTIONS: No  FALLS:  Has patient fallen in last 6 months? No  LIVING ENVIRONMENT: Lives with: lives with their spouse Lives in: House/apartment   OCCUPATION: Retired  PLOF: Independent, Independent with basic ADLs, Independent with household mobility without device, Independent with community mobility without device, Independent with homemaking with ambulation, Independent with gait, and Independent with transfers  PATIENT GOALS: to be able to use my arms without pain and for my neck to stop hurting  NEXT MD VISIT: prn  OBJECTIVE:   DIAGNOSTIC FINDINGS:  Patient not candidate for MRI due to pacemaker  COGNITION: Overall cognitive status: Within functional limits for tasks assessed     SENSATION: WFL  POSTURE: Rounded shoulders/ forward head  UPPER EXTREMITY ROM:   WFL but with painful arc and end range pain  UPPER EXTREMITY MMT:  Generally 4/5 left UE with exception of ER which is 4-/5  SHOULDER SPECIAL TESTS: Impingement tests: Painful arc test: positive     TODAY'S TREATMENT:       DATE: 09/13/22 Arm bike:  level 1x 4 min (2/2)-PT present to discuss progress Wife requested spouse come back for explanation re: why her neck hurt due to shoulder issues and  Bent over row 10x 2# left (used telcor for right wrist comfort) Bent over kneeling left shoulder extension 2#  10x Bent over left shoulder horizontal abduction 2# 10x Sidelying external rotation 2# left 10x Supine serratus punch 2# right/left 10x Manual: upper trap and levator stretch bilaterally, STM to left and right upper trap, PROM c spine all planes of motion,  attempted sub occipital release but patient stated this was painful.    09/02/22 Discussion on the need for ex's to compliment DN and for best long term carryover Review of HEP band ex's:  red band with handles row 15x; bil shoulder extension 15x; single arm tricep extensions 15x right/left 4# bicep curls 10x right/left  Counter push ups 12x Discussed sit to stand ex for general strengthening Manual therapy to cervical musculature bil  Trigger Point Dry-Needling  Treatment instructions: Expect mild to moderate muscle soreness. S/S of pneumothorax if dry needled over a lung field, and to seek immediate medical attention should they occur. Patient verbalized understanding of these instructions and education. Patient Consent Given: Yes Education handout provided: Previously provided Muscles treated: bil upper traps, levator scap (less pistoning secondary to inc pain with DN last visit) Treatment response/outcome: Utilized skilled palpation to identify trigger points.  During dry needling able to palpate muscle twitch and muscle elongation  Skilled palpation and monitoring by PT during dry needling  08/19/22 Arm bike: level 1x 4. 5 min (2/2)-PT present to discuss progress Cervical A/ROM: 3 ways 10 secondx 5 each Cervical retraction x 5  Trigger Point Dry-Needling  Treatment instructions: Expect mild to moderate muscle  soreness. S/S of pneumothorax if dry needled over a lung field, and to seek immediate medical attention should they occur. Patient verbalized understanding of these instructions and education. Patient Consent Given: Yes Education handout provided: Previously provided Muscles treated: bil upper traps, lower cervical and  upper thoracic multifidi bil Treatment response/outcome: Utilized skilled palpation to identify trigger points.  During dry needling able to palpate muscle twitch and muscle elongation  STM bil neck and scapular region in sitting  Skilled palpation and monitoring by PT during dry needling                                                                                                                            PATIENT EDUCATION: Education details: initiated hep Person educated: Patient Education method: Programmer, multimedia, Facilities manager, Verbal cues, and Handouts Education comprehension: verbalized understanding and returned demonstration  HOME EXERCISE PROGRAM: Access Code: [email or web address] URL: [email or web address] Date: 08/11/2022 Prepared by: Lavinia Sharps  Exercises - Standing Cervical Flexion AROM  - 3-4 x daily - 7 x weekly - 1 sets - 5 reps - 5-10 seconds hold - Standing Cervical Rotation AROM  - 3-4 x daily - 7 x weekly - 1 sets - 5 reps - 5-10seconds hold - Standing Cervical Sidebending AROM  - 3-4 x daily - 7 x weekly - 1 sets - 5 reps - 5-10second hold - Standing Cervical Retraction  - 3-4 x daily - 7 x weekly - 1 sets - 5 reps - 5-10second hold - Single Arm Bent Over Shoulder Extension with Dumbbell  - 1 x daily - 7 x weekly - 3 sets - 10 reps - Bent Over Single Arm Shoulder Row with Dumbbell  - 1 x daily - 7 x weekly - 3 sets - 10 reps - Single Arm Bent Over Shoulder Horizontal Abduction with Dumbbell - Palm Down  - 1 x daily - 7 x weekly - 3 sets - 10 reps - Sidelying Shoulder External Rotation Dumbbell  - 1 x daily - 7 x weekly - 3 sets - 10 reps - Single  Arm Serratus Punches in Supine with Dumbbell  - 1 x daily - 7 x weekly - 3 sets - 10 reps - Standing Bilateral Low Shoulder Row with Anchored Resistance  - 1 x daily - 7 x weekly - 2 sets - 10 reps - Shoulder extension with resistance - Neutral  - 1 x daily - 7 x weekly - 2 sets - 10 reps - Standing Elbow Extension with Anchored Resistance  - 1 x daily - 7 x weekly - 2 sets - 10 reps  ASSESSMENT:  CLINICAL IMPRESSION: [name] continues to have left shoulder issues.  She is somewhat improved in that she is able to tolerate increased resistance  on all scapular stabilization exercises.  However, she admits she has not done any of her HEP.  Her left upper trap is severely hypertrophied indicating compensation.  She has significant trigger point and taut bands in the left upper trap but did not want to do dry needling.  She is also dealing with intermittent vertigo which is likely due to hypofunction since she is not moving her head.  She has been having to take hydromorphone in the past few days.  She asks about whether we felt that having the left TSA would help with her neck.  At this point, she wanted spouse to come back to hear.  PT educated patient and spouse on compensatory use of upper trap and levator when rotator cuff was deficient and how this contributes to neck pain.  She will continue PT but compliance with HEP was highly emphasized.  Suggested to patient and spouse that she do her HEP every day for the next week to see if this will be effective at getting better control of her pain.  She would likely benefit from continuing skilled PT for postural strengthening and left shoulder stabilization along with c spine ROM.    OBJECTIVE IMPAIRMENTS: decreased mobility, decreased ROM, decreased strength, hypomobility, increased fascial restrictions, increased muscle spasms, impaired flexibility, impaired UE functional use, postural dysfunction, and pain.   ACTIVITY LIMITATIONS: carrying, lifting,  sleeping, transfers, bed mobility, bathing, toileting, and dressing  PARTICIPATION LIMITATIONS: meal prep, cleaning, laundry, driving, shopping, and church  PERSONAL FACTORS: Age, Fitness, Past/current experiences, and 1-2 comorbidities: multiple fusions, OA  are also affecting patient's functional outcome.   REHAB POTENTIAL: Good  CLINICAL DECISION MAKING: Stable/uncomplicated  EVALUATION COMPLEXITY: Low  GOALS: Goals reviewed with patient? Yes  SHORT TERM GOALS: Target date: 09/02/2022   Patient will be independent with initial HEP  Baseline: Goal status: partially met   2.  Pain report to be no greater than 4/10  Baseline:  Goal status: ongoing  LONG TERM GOALS: Target date: 09/30/2022   Patient to be independent with advanced HEP  Baseline:  Goal status: INITIAL  2.  Patient to report pain no greater than 2/10  Baseline:  Goal status: INITIAL  3.  Patient to be able to sleep through the night  Baseline:  Goal status: INITIAL  4.  Patient will be able to reach overhead into cabinets and on top of shelves without pain  Baseline:  Goal status: INITIAL  5.  Patient to be able to stay involved in her church activities Baseline:  Goal status: INITIAL  6.  C spine ROM to improve to [facility] Baseline:  Goal status: INITIAL  PLAN: PT FREQUENCY: 2x/week  PT DURATION: 8 weeks  PLANNED INTERVENTIONS: Therapeutic exercises, Therapeutic activity, Neuromuscular re-education, Balance training, Patient/Family education, Self Care, Joint mobilization, Stair training, DME instructions, Aquatic Therapy, Dry Needling, Electrical stimulation, Spinal manipulation, Cryotherapy, Moist heat, Taping, Vasopneumatic device, Traction, Ultrasound, Ionotophoresis 4mg /ml Dexamethasone, Manual therapy, and Re-evaluation  PLAN FOR NEXT SESSION:Patient requested to hold on DN, having too much soreness.   Work on [employer] strength, postural strength, cervical/thoracic mobility  Erik Burkett B.  Annelisa Ryback, PT 09/13/22 12:11 PM [facility] [address], [address] [place], [place] [address] Phone # [phone] Fax [phone]

## 2022-09-15 ENCOUNTER — Ambulatory Visit: Payer: Medicare PPO

## 2022-09-15 DIAGNOSIS — M25612 Stiffness of left shoulder, not elsewhere classified: Secondary | ICD-10-CM

## 2022-09-15 DIAGNOSIS — R252 Cramp and spasm: Secondary | ICD-10-CM

## 2022-09-15 DIAGNOSIS — R293 Abnormal posture: Secondary | ICD-10-CM

## 2022-09-15 DIAGNOSIS — M6281 Muscle weakness (generalized): Secondary | ICD-10-CM

## 2022-09-15 DIAGNOSIS — G8929 Other chronic pain: Secondary | ICD-10-CM

## 2022-09-15 DIAGNOSIS — M25512 Pain in left shoulder: Secondary | ICD-10-CM | POA: Diagnosis not present

## 2022-09-15 NOTE — Therapy (Signed)
OUTPATIENT PHYSICAL THERAPY TREATMENT   Patient Name: [name] MRN: [id number] DOB:14-Oct-1955, 67 y.o., female Today's Date: 09/15/2022  END OF SESSION:  PT End of Session - 09/15/22 1116     Visit Number 7    Date for PT Re-Evaluation 09/30/22    Authorization Type [employer]    Authorization Time Period 16 visits. 08/05/2022-09/30/2022    Authorization - Visit Number 7    Authorization - Number of Visits 16    Progress Note Due on Visit 10    PT Start Time 1058    PT Stop Time 1145    PT Time Calculation (min) 47 min    Activity Tolerance Patient tolerated treatment well    Behavior During Therapy WFL for tasks assessed/performed                Past Medical History:  Diagnosis Date   AF (paroxysmal atrial fibrillation) (HCC)    Arthritis    BCC (basal cell carcinoma) 03/11/2003   left distal lower leg ankle-tx dr Danella Deis   Biventricular cardiac pacemaker in situ    a. prior CRT-D in 2013 with downgrade to CRT-Pacemaker only in 11/2016.   Breast cancer (HCC) 12/12/2019   Chronic systolic CHF (congestive heart failure) (HCC)    Complication of anesthesia    aspiration   Essential tremor    GERD (gastroesophageal reflux disease)    otc   Hypertension    Hypothyroidism    LBBB (left bundle branch block)    conduction disorder   Migraines    Mild aortic insufficiency    NICM (nonischemic cardiomyopathy) (HCC)    a. 2012: normal coronaries, EF 25-30%. b. improved to 55% 11/2016.   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   in situ- right lower leg-anterior ([id number])   Scoliosis    Past Surgical History:  Procedure Laterality Date   ARTERY BIOPSY  12/29/2011   Procedure: MINOR BIOPSY TEMPORAL ARTERY;  Surgeon: Mariella Saa, MD;   Location: [facility];  Service: General;  Laterality: Right;  Right temporal artery biopsy   BACK SURGERY  12/31/2009   "for flat back syndrome; fused bottom of my back"   BI-VENTRICULAR IMPLANTABLE CARDIOVERTER DEFIBRILLATOR Left 01/20/2011   Procedure: BI-VENTRICULAR IMPLANTABLE CARDIOVERTER DEFIBRILLATOR  (CRT-D);  Surgeon: Marinus Maw, MD;  Location: [facility] CATH LAB;  Service: Cardiovascular;  Laterality: Left;   BIV ICD GENERATOR CHANGEOUT N/A 11/24/2016   Procedure: BIV ICD GENERATOR CHANGEOUT;  Surgeon: Marinus Maw, MD;  Location: [facility] INVASIVE CV LAB;  Service: Cardiovascular;  Laterality: N/A;   BREAST LUMPECTOMY WITH RADIOACTIVE SEED LOCALIZATION Left 01/22/2020   Procedure: LEFT BREAST LUMPECTOMY WITH RADIOACTIVE SEED LOCALIZATION;  Surgeon: Manus Rudd, MD;  Location: [facility];  Service: General;  Laterality: Left;  LMA   CHOLECYSTECTOMY  ~1996   FOOT NEUROMA SURGERY  ~ 2003   right   RE-EXCISION OF BREAST LUMPECTOMY Left 03/03/2020   Procedure: RE-EXCISION MARGINS OF LEFT BREAST LUMPECTOMY;  Surgeon: Manus Rudd, MD;  Location: [facility];  Service: General;  Laterality: Left;   ROTATOR CUFF REPAIR  ~ 2009   left   SHOULDER ARTHROSCOPY W/ ROTATOR CUFF REPAIR Right 09/21/2011   SKIN CANCER EXCISION  nose, forehead, left leg   TEE WITHOUT CARDIOVERSION N/A 03/01/2017   Procedure: TRANSESOPHAGEAL ECHOCARDIOGRAM (TEE);  Surgeon: Chilton Si, MD;  Location: [facility] ENDOSCOPY;  Service: Cardiovascular;  Laterality: N/A;   THYROID LOBECTOMY  ~ 2006   right   TUBAL LIGATION  ~ 1983   VAGINAL HYSTERECTOMY  ~ 2009   VESICOVAGINAL FISTULA CLOSURE W/ TAH     Patient Active Problem List   Diagnosis Date Noted   Biventricular ICD (implantable cardioverter-defibrillator) in place    DOE (dyspnea on exertion) 04/07/2021   Age-related osteoporosis without current pathological fracture 11/29/2020   Atrophy of vagina 11/29/2020    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   Encounter for counseling 09/17/2020   Breast CA (HCC) 02/18/2020   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   Rosacea 11/05/2019   Scar 11/05/2019   Biventricular cardiac pacemaker in situ 10/08/2018   Long term current use of anticoagulant therapy 10/23/2017   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   Trigeminal neuralgia 12/14/2012   Insomnia, persistent 11/03/2011   AICD (automatic cardioverter/defibrillator) present    Aortic regurgitation    Nonischemic cardiomyopathy (HCC) 01/21/2011   Esophagitis, reflux 08/24/2010   Idiopathic peripheral neuropathy 08/24/2010   Chronic systolic CHF (congestive heart failure) (HCC) 02/24/2010   Paroxysmal atrial fibrillation (HCC) 02/23/2010   Hypothyroidism    History of migraine headaches    Left bundle branch block    Scoliosis     Class: Chronic   Lumbar canal stenosis 12/25/2009   Anxiety state 07/24/2008   Avitaminosis D 03/21/2008   Atypical migraine 01/30/2008   Benign essential HTN 01/30/2008   Paroxysmal digital cyanosis 02/15/2007    PCP: Cammy Copa, MD   REFERRING PROVIDER: Cammy Copa, MD  REFERRING DIAG: [phone] (ICD-10-CM) - Arthritis of left shoulder region  THERAPY DIAG:  Chronic left shoulder pain  Cramp and spasm  Muscle weakness (generalized)  Stiffness of left shoulder, not elsewhere classified  Abnormal posture  Rationale for Evaluation and Treatment: Rehabilitation  ONSET DATE: 06/20/2022  SUBJECTIVE:                                                                                                                                                                                      SUBJECTIVE STATEMENT: Patient reports that she is feeling better.  "I think I was more keyed up last time because I had company visiting"      Eval: Patient reports on going shoulder and neck issues for several months.  She is unable to use her  arms overhead without pain.  She is able to do most of her usual community activities but  has felt like cutting back on activities due to her constant pain. She states she lives with her husband.  Son lives out of town.  She has 2 dogs that she walks daily.  I feel like I just don't have a lot of energy.  I'm on new medication that is an anti-depressant that is supposed to help with bone aches and pain.  I help at the church at times.  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  I still fix supper and clean my own house.  She was a Engineer, site.  Has someone come in to help with heavy duty cleaning once every two weeks.  Hx of spinal surgery/ fusion. Hand dominance: Right  PERTINENT HISTORY: Lumbar fusion to pelvic fusion  PAIN:  Are you having pain? Yes, left  and right neck 6/10 and left shoulder cracks   PRECAUTIONS: None  RED FLAGS: None   WEIGHT BEARING RESTRICTIONS: No  FALLS:  Has patient fallen in last 6 months? No  LIVING ENVIRONMENT: Lives with: lives with their spouse Lives in: House/apartment   OCCUPATION: Retired  PLOF: Independent, Independent with basic ADLs, Independent with household mobility without device, Independent with community mobility without device, Independent with homemaking with ambulation, Independent with gait, and Independent with transfers  PATIENT GOALS: to be able to use my arms without pain and for my neck to stop hurting  NEXT MD VISIT: prn  OBJECTIVE:   DIAGNOSTIC FINDINGS:  Patient not candidate for MRI due to pacemaker  COGNITION: Overall cognitive status: Within functional limits for tasks assessed     SENSATION: WFL  POSTURE: Rounded shoulders/ forward head  UPPER EXTREMITY ROM:   WFL but with painful arc and end range pain  UPPER EXTREMITY MMT:  Generally 4/5 left UE with exception of ER which is 4-/5  SHOULDER SPECIAL TESTS: Impingement tests: Painful arc test: positive      TODAY'S TREATMENT:       DATE: 09/13/22 Arm bike: level 1x 4 min (2/2)-PT present to discuss progress Standing shoulder flexion and scaption x 10 with 0# in front of mirror to monitor compensation 3 way scapular stabilization with green loop x 5 Seated bilateral shoulder ER with yellow band 2 x 10 Supine horizontal abd with yellow band 2 x 10 Supine D2 PNF with left shoulder using yellow band x 10 first set, could only do 8 on second set AAROM using dowel: flexion, abduction and ER Supine shoulder alphabet with 1 lb A-Z Manual: upper trap and levator stretch bilaterally, STM to left and right upper trap, PROM c spine all planes of motion,  attempted sub occipital release but patient stated this was painful.    09/13/22 Arm bike: level 1x 4 min (2/2)-PT present to discuss progress Wife requested spouse come back for explanation re: why her neck hurt due to shoulder issues and  Bent over row 10x 2# left (used telcor for right wrist comfort) Bent over kneeling left shoulder extension 2#  10x Bent over left shoulder horizontal abduction 2# 10x Sidelying external rotation 2# left 10x Supine serratus punch 2# right/left 10x Manual: upper trap and levator stretch bilaterally, STM to left and right upper trap, PROM c spine all planes of motion,  attempted sub occipital release but patient stated this was painful.    09/02/22 Discussion on the need for ex's to compliment DN and for best long term carryover Review of HEP band ex's:  red band with handles  row 15x; bil shoulder extension 15x; single arm tricep extensions 15x right/left 4# bicep curls 10x right/left  Counter push ups 12x Discussed sit to stand ex for general strengthening Manual therapy to cervical musculature bil  Trigger Point Dry-Needling  Treatment instructions: Expect mild to moderate muscle soreness. S/S of pneumothorax if dry needled over a lung field, and to seek immediate medical attention should they occur. Patient  verbalized understanding of these instructions and education. Patient Consent Given: Yes Education handout provided: Previously provided Muscles treated: bil upper traps, levator scap (less pistoning secondary to inc pain with DN last visit) Treatment response/outcome: Utilized skilled palpation to identify trigger points.  During dry needling able to palpate muscle twitch and muscle elongation  Skilled palpation and monitoring by PT during dry needling         PATIENT EDUCATION: Education details: initiated hep Person educated: Patient Education method: Programmer, multimedia, Facilities manager, Verbal cues, and Handouts Education comprehension: verbalized understanding and returned demonstration  HOME EXERCISE PROGRAM: Access Code: [email or web address] URL: [email or web address] Date: 08/11/2022 Prepared by: Lavinia Sharps  Exercises - Standing Cervical Flexion AROM  - 3-4 x daily - 7 x weekly - 1 sets - 5 reps - 5-10 seconds hold - Standing Cervical Rotation AROM  - 3-4 x daily - 7 x weekly - 1 sets - 5 reps - 5-10seconds hold - Standing Cervical Sidebending AROM  - 3-4 x daily - 7 x weekly - 1 sets - 5 reps - 5-10second hold - Standing Cervical Retraction  - 3-4 x daily - 7 x weekly - 1 sets - 5 reps - 5-10second hold - Single Arm Bent Over Shoulder Extension with Dumbbell  - 1 x daily - 7 x weekly - 3 sets - 10 reps - Bent Over Single Arm Shoulder Row with Dumbbell  - 1 x daily - 7 x weekly - 3 sets - 10 reps - Single Arm Bent Over Shoulder Horizontal Abduction with Dumbbell - Palm Down  - 1 x daily - 7 x weekly - 3 sets - 10 reps - Sidelying Shoulder External Rotation Dumbbell  - 1 x daily - 7 x weekly - 3 sets - 10 reps - Single Arm Serratus Punches in Supine with Dumbbell  - 1 x daily - 7 x weekly - 3 sets - 10 reps - Standing Bilateral Low Shoulder Row with Anchored Resistance  - 1 x daily - 7 x weekly - 2 sets - 10 reps - Shoulder extension with resistance - Neutral  - 1 x daily - 7 x  weekly - 2 sets - 10 reps - Standing Elbow Extension with Anchored Resistance  - 1 x daily - 7 x weekly - 2 sets - 10 reps  ASSESSMENT:  CLINICAL IMPRESSION: Sari was able to tolerate more today.  She was actually able to achieve near full ROM during AAROM in supine.  She was also able to tolerate some shoulder and postural strengthening without increased pain.  The only activity that she could not complete was the D2 PNF.   She would likely benefit from continuing skilled PT for postural strengthening and left shoulder stabilization along with c spine ROM.    OBJECTIVE IMPAIRMENTS: decreased mobility, decreased ROM, decreased strength, hypomobility, increased fascial restrictions, increased muscle spasms, impaired flexibility, impaired UE functional use, postural dysfunction, and pain.   ACTIVITY LIMITATIONS: carrying, lifting, sleeping, transfers, bed mobility, bathing, toileting, and dressing  PARTICIPATION LIMITATIONS: meal prep, cleaning, laundry, driving, shopping, and church  PERSONAL FACTORS: Age, Fitness, Past/current  experiences, and 1-2 comorbidities: multiple fusions, OA  are also affecting patient's functional outcome.   REHAB POTENTIAL: Good  CLINICAL DECISION MAKING: Stable/uncomplicated  EVALUATION COMPLEXITY: Low  GOALS: Goals reviewed with patient? Yes  SHORT TERM GOALS: Target date: 09/02/2022   Patient will be independent with initial HEP  Baseline: Goal status: partially met   2.  Pain report to be no greater than 4/10  Baseline:  Goal status: ongoing  LONG TERM GOALS: Target date: 09/30/2022   Patient to be independent with advanced HEP  Baseline:  Goal status: INITIAL  2.  Patient to report pain no greater than 2/10  Baseline:  Goal status: INITIAL  3.  Patient to be able to sleep through the night  Baseline:  Goal status: INITIAL  4.  Patient will be able to reach overhead into cabinets and on top of shelves without pain  Baseline:  Goal  status: INITIAL  5.  Patient to be able to stay involved in her church activities Baseline:  Goal status: INITIAL  6.  C spine ROM to improve to [facility] Baseline:  Goal status: INITIAL  PLAN: PT FREQUENCY: 2x/week  PT DURATION: 8 weeks  PLANNED INTERVENTIONS: Therapeutic exercises, Therapeutic activity, Neuromuscular re-education, Balance training, Patient/Family education, Self Care, Joint mobilization, Stair training, DME instructions, Aquatic Therapy, Dry Needling, Electrical stimulation, Spinal manipulation, Cryotherapy, Moist heat, Taping, Vasopneumatic device, Traction, Ultrasound, Ionotophoresis 4mg /ml Dexamethasone, Manual therapy, and Re-evaluation  PLAN FOR NEXT SESSION:Patient requested to hold on DN, having too much soreness.   Work on [employer] strength, postural strength, cervical/thoracic mobility  Makena Murdock B. Alexx Mcburney, PT 09/15/22 2:52 PM [facility] [address] [place], [place] [address] Phone # [phone] Fax [phone]

## 2022-09-17 ENCOUNTER — Other Ambulatory Visit (HOSPITAL_BASED_OUTPATIENT_CLINIC_OR_DEPARTMENT_OTHER): Payer: Self-pay | Admitting: Family Medicine

## 2022-09-17 ENCOUNTER — Other Ambulatory Visit (HOSPITAL_BASED_OUTPATIENT_CLINIC_OR_DEPARTMENT_OTHER): Payer: Self-pay

## 2022-09-19 MED ORDER — OMEPRAZOLE 20 MG PO CPDR: 20.0000 mg | DELAYED_RELEASE_CAPSULE | Freq: Every day | ORAL | 0 refills | Status: DC

## 2022-09-20 ENCOUNTER — Ambulatory Visit (INDEPENDENT_AMBULATORY_CARE_PROVIDER_SITE_OTHER): Payer: Medicare PPO

## 2022-09-20 ENCOUNTER — Ambulatory Visit: Payer: Medicare PPO

## 2022-09-20 ENCOUNTER — Encounter (HOSPITAL_BASED_OUTPATIENT_CLINIC_OR_DEPARTMENT_OTHER): Payer: Self-pay

## 2022-09-20 VITALS — Ht 67.5 in | Wt 162.0 lb

## 2022-09-20 DIAGNOSIS — G8929 Other chronic pain: Secondary | ICD-10-CM

## 2022-09-20 DIAGNOSIS — Z0000 Encounter for general adult medical examination without abnormal findings: Secondary | ICD-10-CM

## 2022-09-20 DIAGNOSIS — M6281 Muscle weakness (generalized): Secondary | ICD-10-CM

## 2022-09-20 DIAGNOSIS — Z780 Asymptomatic menopausal state: Secondary | ICD-10-CM

## 2022-09-20 DIAGNOSIS — R293 Abnormal posture: Secondary | ICD-10-CM

## 2022-09-20 DIAGNOSIS — M25612 Stiffness of left shoulder, not elsewhere classified: Secondary | ICD-10-CM

## 2022-09-20 DIAGNOSIS — R252 Cramp and spasm: Secondary | ICD-10-CM

## 2022-09-20 DIAGNOSIS — Z1231 Encounter for screening mammogram for malignant neoplasm of breast: Secondary | ICD-10-CM

## 2022-09-20 DIAGNOSIS — M25512 Pain in left shoulder: Secondary | ICD-10-CM | POA: Diagnosis not present

## 2022-09-20 DIAGNOSIS — M810 Age-related osteoporosis without current pathological fracture: Secondary | ICD-10-CM

## 2022-09-20 NOTE — Patient Instructions (Signed)
[name] , Thank you for taking time to come for your Medicare Wellness Visit. I appreciate your ongoing commitment to your health goals. Please review the following plan we discussed and let me know if I can assist you in the future.   Referrals/Orders/Follow-Ups/Clinician Recommendations:  You have an order for:  []   2D Mammogram  [x]   3D Mammogram  [x]   Bone Density     Please call for appointment:   [facility] [address] [place], [place] [address] [phone]   Make sure to wear two-piece clothing.  No lotions powders or deodorants the day of the appointment Make sure to bring picture ID and insurance card.  Bring list of medications you are currently taking including any supplements.   Schedule your [facility] screening mammogram through MyChart!   Log into your MyChart account.  Go to 'Visit' (or 'Appointments' if on mobile App) --> Schedule an Appointment  Under 'Select a Reason for Visit' choose the Mammogram Screening option.  Complete the pre-visit questions and select the time and place that best fits your schedule.    This is a list of the screening recommended for you and due dates:  Health Maintenance  Topic Date Due   Colon Cancer Screening  Never done   Hepatitis C Screening  Never done   Zoster (Shingles) Vaccine (1 of 2) 10/19/1972   Pneumonia Vaccine (3 of 3 - PPSV23 or PCV20) 10/21/2019   DEXA scan (bone density measurement)  12/23/2021   Flu Shot  08/11/2022   COVID-19 Vaccine (6 - 2023-24 season) 09/11/2022   Mammogram  12/25/2022   Medicare Annual Wellness Visit  09/20/2023   DTaP/Tdap/Td vaccine (3 - Td or Tdap) 06/02/2028   HPV Vaccine  Aged Out    Advanced directives: (ACP Link)Information on Advanced Care Planning can be found at [facility] of [place] Advance Health Care Directives Advance Health Care Directives ([email or web address]/)   Next Medicare Annual Wellness Visit scheduled for next year:  Yes  Preventive Care 65 Years and Older, Female Preventive care refers to lifestyle choices and visits with your health care provider that can promote health and wellness. Preventive care visits are also called wellness exams. What can I expect for my preventive care visit? Counseling Your health care provider may ask you questions about your: Medical history, including: Past medical problems. Family medical history. Pregnancy and menstrual history. History of falls. Current health, including: Memory and ability to understand (cognition). Emotional well-being. Home life and relationship well-being. Sexual activity and sexual health. Lifestyle, including: Alcohol, nicotine or tobacco, and drug use. Access to firearms. Diet, exercise, and sleep habits. Work and work Astronomer. Sunscreen use. Safety issues such as seatbelt and bike helmet use. Physical exam Your health care provider will check your: Height and weight. These may be used to calculate your BMI (body mass index). BMI is a measurement that tells if you are at a healthy weight. Waist circumference. This measures the distance around your waistline. This measurement also tells if you are at a healthy weight and may help predict your risk of certain diseases, such as type 2 diabetes and high blood pressure. Heart rate and blood pressure. Body temperature. Skin for abnormal spots. What immunizations do I need?  Vaccines are usually given at various ages, according to a schedule. Your health care provider will recommend vaccines for you based on your age, medical history, and lifestyle or other factors, such as travel or where you work. What tests  do I need? Screening Your health care provider may recommend screening tests for certain conditions. This may include: Lipid and cholesterol levels. Hepatitis C test. Hepatitis B test. HIV (human immunodeficiency virus) test. STI (sexually transmitted infection) testing, if you  are at risk. Lung cancer screening. Colorectal cancer screening. Diabetes screening. This is done by checking your blood sugar (glucose) after you have not eaten for a while (fasting). Mammogram. Talk with your health care provider about how often you should have regular mammograms. BRCA-related cancer screening. This may be done if you have a family history of breast, ovarian, tubal, or peritoneal cancers. Bone density scan. This is done to screen for osteoporosis. Talk with your health care provider about your test results, treatment options, and if necessary, the need for more tests. Follow these instructions at home: Eating and drinking  Eat a diet that includes fresh fruits and vegetables, whole grains, lean protein, and low-fat dairy products. Limit your intake of foods with high amounts of sugar, saturated fats, and salt. Take vitamin and mineral supplements as recommended by your health care provider. Do not drink alcohol if your health care provider tells you not to drink. If you drink alcohol: Limit how much you have to 0-1 drink a day. Know how much alcohol is in your drink. In the [place] one drink equals one 12 oz bottle of beer (355 mL), one 5 oz glass of wine (148 mL), or one 1 oz glass of hard liquor (44 mL). Lifestyle Brush your teeth every morning and night with fluoride toothpaste. Floss one time each day. Exercise for at least 30 minutes 5 or more days each week. Do not use any products that contain nicotine or tobacco. These products include cigarettes, chewing tobacco, and vaping devices, such as e-cigarettes. If you need help quitting, ask your health care provider. Do not use drugs. If you are sexually active, practice safe sex. Use a condom or other form of protection in order to prevent STIs. Take aspirin only as told by your health care provider. Make sure that you understand how much to take and what form to take. Work with your health care provider to find out  whether it is safe and beneficial for you to take aspirin daily. Ask your health care provider if you need to take a cholesterol-lowering medicine (statin). Find healthy ways to manage stress, such as: Meditation, yoga, or listening to music. Journaling. Talking to a trusted person. Spending time with friends and family. Minimize exposure to UV radiation to reduce your risk of skin cancer. Safety Always wear your seat belt while driving or riding in a vehicle. Do not drive: If you have been drinking alcohol. Do not ride with someone who has been drinking. When you are tired or distracted. While texting. If you have been using any mind-altering substances or drugs. Wear a helmet and other protective equipment during sports activities. If you have firearms in your house, make sure you follow all gun safety procedures. What's next? Visit your health care provider once a year for an annual wellness visit. Ask your health care provider how often you should have your eyes and teeth checked. Stay up to date on all vaccines. This information is not intended to replace advice given to you by your health care provider. Make sure you discuss any questions you have with your health care provider. Document Revised: 06/24/2020 Document Reviewed: 06/24/2020 Elsevier Patient Education  2024 [employer]. Understanding Your Risk for Falls Millions of people have  serious injuries from falls each year. It is important to understand your risk of falling. Talk with your health care provider about your risk and what you can do to lower it. If you do have a serious fall, make sure to tell your provider. Falling once raises your risk of falling again. How can falls affect me? Serious injuries from falls are common. These include: Broken bones, such as hip fractures. Head injuries, such as traumatic brain injuries (TBI) or concussions. A fear of falling can cause you to avoid activities and stay at home. This  can make your muscles weaker and raise your risk for a fall. What can increase my risk? There are a number of risk factors that increase your risk for falling. The more risk factors you have, the higher your risk of falling. Serious injuries from a fall happen most often to people who are older than 68 years old. Teenagers and young adults ages 64-29 are also at higher risk. Common risk factors include: Weakness in the lower body. Being generally weak or confused due to long-term (chronic) illness. Dizziness or balance problems. Poor vision. Medicines that cause dizziness or drowsiness. These may include: Medicines for your blood pressure, heart, anxiety, insomnia, or swelling (edema). Pain medicines. Muscle relaxants. Other risk factors include: Drinking alcohol. Having had a fall in the past. Having foot pain or wearing improper footwear. Working at a dangerous job. Having any of the following in your home: Tripping hazards, such as floor clutter or loose rugs. Poor lighting. Pets. Having dementia or memory loss. What actions can I take to lower my risk of falling?     Physical activity Stay physically fit. Do strength and balance exercises. Consider taking a regular class to build strength and balance. Yoga and tai chi are good options. Vision Have your eyes checked every year and your prescription for glasses or contacts updated as needed. Shoes and walking aids Wear non-skid shoes. Wear shoes that have rubber soles and low heels. Do not wear high heels. Do not walk around the house in socks or slippers. Use a cane or walker as told by your provider. Home safety Attach secure railings on both sides of your stairs. Install grab bars for your bathtub, shower, and toilet. Use a non-skid mat in your bathtub or shower. Attach bath mats securely with double-sided, non-slip rug tape. Use good lighting in all rooms. Keep a flashlight near your bed. Make sure there is a clear path  from your bed to the bathroom. Use night-lights. Do not use throw rugs. Make sure all carpeting is taped or tacked down securely. Remove all clutter from walkways and stairways, including extension cords. Repair uneven or broken steps and floors. Avoid walking on icy or slippery surfaces. Walk on the grass instead of on icy or slick sidewalks. Use ice melter to get rid of ice on walkways in the winter. Use a cordless phone. Questions to ask your health care provider Can you help me check my risk for a fall? Do any of my medicines make me more likely to fall? Should I take a vitamin D supplement? What exercises can I do to improve my strength and balance? Should I make an appointment to have my vision checked? Do I need a bone density test to check for weak bones (osteoporosis)? Would it help to use a cane or a walker? Where to find more information Centers for Disease Control and Prevention, [employer]: TonerPromos.no Community-Based Fall Prevention Programs: TonerPromos.no [employer] on  Aging: BaseRingTones.pl Contact a health care provider if: You fall at home. You are afraid of falling at home. You feel weak, drowsy, or dizzy. This information is not intended to replace advice given to you by your health care provider. Make sure you discuss any questions you have with your health care provider. Document Revised: 08/30/2021 Document Reviewed: 08/30/2021 Elsevier Patient Education  2024 [employer].

## 2022-09-20 NOTE — Therapy (Signed)
OUTPATIENT PHYSICAL THERAPY TREATMENT   Patient Name: [name] MRN: [id number] DOB:07/13/1955, 67 y.o., female Today's Date: 09/20/2022  END OF SESSION:  PT End of Session - 09/20/22 1103     Visit Number 8    Date for PT Re-Evaluation 09/30/22    Authorization Type [employer]    Authorization Time Period 16 visits. 08/05/2022-09/30/2022    Progress Note Due on Visit 10    PT Start Time 1100    PT Stop Time 1145    PT Time Calculation (min) 45 min    Activity Tolerance Patient tolerated treatment well    Behavior During Therapy WFL for tasks assessed/performed                Past Medical History:  Diagnosis Date   AF (paroxysmal atrial fibrillation) (HCC)    Arthritis    BCC (basal cell carcinoma) 03/11/2003   left distal lower leg ankle-tx dr Danella Deis   Biventricular cardiac pacemaker in situ    a. prior CRT-D in 2013 with downgrade to CRT-Pacemaker only in 11/2016.   Breast cancer (HCC) 12/12/2019   Chronic systolic CHF (congestive heart failure) (HCC)    Complication of anesthesia    aspiration   Essential tremor    GERD (gastroesophageal reflux disease)    otc   Hypertension    Hypothyroidism    LBBB (left bundle branch block)    conduction disorder   Migraines    Mild aortic insufficiency    NICM (nonischemic cardiomyopathy) (HCC)    a. 2012: normal coronaries, EF 25-30%. b. improved to 55% 11/2016.   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   in situ- right lower leg-anterior ([id number])   Scoliosis    Past Surgical History:  Procedure Laterality Date   ARTERY BIOPSY  12/29/2011   Procedure: MINOR BIOPSY TEMPORAL ARTERY;  Surgeon: Mariella Saa, MD;  Location: [facility];  Service: General;  Laterality: Right;   Right temporal artery biopsy   BACK SURGERY  12/31/2009   "for flat back syndrome; fused bottom of my back"   BI-VENTRICULAR IMPLANTABLE CARDIOVERTER DEFIBRILLATOR Left 01/20/2011   Procedure: BI-VENTRICULAR IMPLANTABLE CARDIOVERTER DEFIBRILLATOR  (CRT-D);  Surgeon: Marinus Maw, MD;  Location: [facility] CATH LAB;  Service: Cardiovascular;  Laterality: Left;   BIV ICD GENERATOR CHANGEOUT N/A 11/24/2016   Procedure: BIV ICD GENERATOR CHANGEOUT;  Surgeon: Marinus Maw, MD;  Location: [facility] INVASIVE CV LAB;  Service: Cardiovascular;  Laterality: N/A;   BREAST LUMPECTOMY WITH RADIOACTIVE SEED LOCALIZATION Left 01/22/2020   Procedure: LEFT BREAST LUMPECTOMY WITH RADIOACTIVE SEED LOCALIZATION;  Surgeon: Manus Rudd, MD;  Location: [facility];  Service: General;  Laterality: Left;  LMA   CHOLECYSTECTOMY  ~1996   FOOT NEUROMA SURGERY  ~ 2003   right   RE-EXCISION OF BREAST LUMPECTOMY Left 03/03/2020   Procedure: RE-EXCISION MARGINS OF LEFT BREAST LUMPECTOMY;  Surgeon: Manus Rudd, MD;  Location: [facility];  Service: General;  Laterality: Left;   ROTATOR CUFF REPAIR  ~ 2009   left   SHOULDER ARTHROSCOPY W/ ROTATOR CUFF REPAIR Right 09/21/2011   SKIN CANCER EXCISION     nose, forehead, left leg   TEE WITHOUT CARDIOVERSION N/A 03/01/2017   Procedure: TRANSESOPHAGEAL ECHOCARDIOGRAM (  TEE);  Surgeon: Chilton Si, MD;  Location: [facility] ENDOSCOPY;  Service: Cardiovascular;  Laterality: N/A;   THYROID LOBECTOMY  ~ 2006   right   TUBAL LIGATION  ~ 1983   VAGINAL HYSTERECTOMY  ~ 2009   VESICOVAGINAL FISTULA CLOSURE W/ TAH     Patient Active Problem List   Diagnosis Date Noted   Biventricular ICD (implantable cardioverter-defibrillator) in place    DOE (dyspnea on exertion) 04/07/2021   Age-related osteoporosis without current pathological fracture 11/29/2020   Atrophy of vagina 11/29/2020   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   Encounter for counseling  09/17/2020   Breast CA (HCC) 02/18/2020   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   Rosacea 11/05/2019   Scar 11/05/2019   Biventricular cardiac pacemaker in situ 10/08/2018   Long term current use of anticoagulant therapy 10/23/2017   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   Trigeminal neuralgia 12/14/2012   Insomnia, persistent 11/03/2011   AICD (automatic cardioverter/defibrillator) present    Aortic regurgitation    Nonischemic cardiomyopathy (HCC) 01/21/2011   Esophagitis, reflux 08/24/2010   Idiopathic peripheral neuropathy 08/24/2010   Chronic systolic CHF (congestive heart failure) (HCC) 02/24/2010   Paroxysmal atrial fibrillation (HCC) 02/23/2010   Hypothyroidism    History of migraine headaches    Left bundle branch block    Scoliosis     Class: Chronic   Lumbar canal stenosis 12/25/2009   Anxiety state 07/24/2008   Avitaminosis D 03/21/2008   Atypical migraine 01/30/2008   Benign essential HTN 01/30/2008   Paroxysmal digital cyanosis 02/15/2007    PCP: Cammy Copa, MD   REFERRING PROVIDER: Cammy Copa, MD  REFERRING DIAG: [phone] (ICD-10-CM) - Arthritis of left shoulder region  THERAPY DIAG:  Chronic left shoulder pain  Cramp and spasm  Muscle weakness (generalized)  Stiffness of left shoulder, not elsewhere classified  Abnormal posture  Rationale for Evaluation and Treatment: Rehabilitation  ONSET DATE: 06/20/2022  SUBJECTIVE:                                                                                                                                                                                      SUBJECTIVE STATEMENT: Patient reports that she did really well after last visit but she started tightening up yesterday.  Patient states the massage was good.      Eval: Patient reports on going shoulder and neck issues for several months.  She is unable to use her arms overhead without pain.  She is able to do most of her  usual community activities but has felt like cutting back on activities due to her constant pain. She states she  lives with her husband.  Son lives out of town.  She has 2 dogs that she walks daily.  I feel like I just don't have a lot of energy.  I'm on new medication that is an anti-depressant that is supposed to help with bone aches and pain.  I help at the church at times.  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  I still fix supper and clean my own house.  She was a Engineer, site.  Has someone come in to help with heavy duty cleaning once every two weeks.  Hx of spinal surgery/ fusion. Hand dominance: Right  PERTINENT HISTORY: Lumbar fusion to pelvic fusion  PAIN:  Are you having pain? Yes, left  and right neck 6/10 and left shoulder cracks   PRECAUTIONS: None  RED FLAGS: None   WEIGHT BEARING RESTRICTIONS: No  FALLS:  Has patient fallen in last 6 months? No  LIVING ENVIRONMENT: Lives with: lives with their spouse Lives in: House/apartment   OCCUPATION: Retired  PLOF: Independent, Independent with basic ADLs, Independent with household mobility without device, Independent with community mobility without device, Independent with homemaking with ambulation, Independent with gait, and Independent with transfers  PATIENT GOALS: to be able to use my arms without pain and for my neck to stop hurting  NEXT MD VISIT: prn  OBJECTIVE:   DIAGNOSTIC FINDINGS:  Patient not candidate for MRI due to pacemaker  COGNITION: Overall cognitive status: Within functional limits for tasks assessed     SENSATION: WFL  POSTURE: Rounded shoulders/ forward head  UPPER EXTREMITY ROM:   WFL but with painful arc and end range pain  UPPER EXTREMITY MMT:  Generally 4/5 left UE with exception of ER which is 4-/5  SHOULDER SPECIAL TESTS: Impingement tests: Painful arc test: positive     TODAY'S TREATMENT:       DATE: 09/20/22 Nustep level 2  x 5 min-PT present to discuss progress 3 way scapular stabilization with green loop x 5 on each side Standing shoulder flexion and scaption x 10 with 0# in front of mirror to monitor compensation both Seated bilateral shoulder ER with yellow band 2 x 10 Supine horizontal abd with yellow band 2 x 10 both Supine D2 PNF with both shoulders using yellow band x 10 each side AAROM using dowel: flexion, abduction and ER both on all except ER (left only) Supine shoulder alphabet with 1 lb A-Z Manual: upper trap and levator stretch bilaterally, STM to left and right upper trap, PROM c spine all planes of motion,  sub occipital release.    DATE: 09/15/22 Arm bike: level 1x 4 min (2/2)-PT present to discuss progress Standing shoulder flexion and scaption x 10 with 0# in front of mirror to monitor compensation 3 way scapular stabilization with green loop x 5 Seated bilateral shoulder ER with yellow band 2 x 10 Supine horizontal abd with yellow band 2 x 10 Supine D2 PNF with left shoulder using yellow band x 10 first set, could only do 8 on second set AAROM using dowel: flexion, abduction and ER Supine shoulder alphabet with 1 lb A-Z Manual: upper trap and levator stretch bilaterally, STM to left and right upper trap, PROM c spine all planes of motion,  attempted sub occipital release but patient stated this was painful.    09/13/22 Arm bike: level 1x 4 min (2/2)-PT present to discuss progress Wife requested spouse come back for explanation re: why her neck hurt  due to shoulder issues and  Bent over row 10x 2# left (used telcor for right wrist comfort) Bent over kneeling left shoulder extension 2#  10x Bent over left shoulder horizontal abduction 2# 10x Sidelying external rotation 2# left 10x Supine serratus punch 2# right/left 10x Manual: upper trap and levator stretch bilaterally, STM to left and right upper trap, PROM c spine all planes of motion,  attempted sub occipital release but patient stated  this was painful.    09/02/22 Discussion on the need for ex's to compliment DN and for best long term carryover Review of HEP band ex's:  red band with handles row 15x; bil shoulder extension 15x; single arm tricep extensions 15x right/left 4# bicep curls 10x right/left  Counter push ups 12x Discussed sit to stand ex for general strengthening Manual therapy to cervical musculature bil  Trigger Point Dry-Needling  Treatment instructions: Expect mild to moderate muscle soreness. S/S of pneumothorax if dry needled over a lung field, and to seek immediate medical attention should they occur. Patient verbalized understanding of these instructions and education. Patient Consent Given: Yes Education handout provided: Previously provided Muscles treated: bil upper traps, levator scap (less pistoning secondary to inc pain with DN last visit) Treatment response/outcome: Utilized skilled palpation to identify trigger points.  During dry needling able to palpate muscle twitch and muscle elongation  Skilled palpation and monitoring by PT during dry needling         PATIENT EDUCATION: Education details: initiated hep Person educated: Patient Education method: Programmer, multimedia, Facilities manager, Verbal cues, and Handouts Education comprehension: verbalized understanding and returned demonstration  HOME EXERCISE PROGRAM: Access Code: [email or web address] URL: [email or web address] Date: 08/11/2022 Prepared by: Lavinia Sharps  Exercises - Standing Cervical Flexion AROM  - 3-4 x daily - 7 x weekly - 1 sets - 5 reps - 5-10 seconds hold - Standing Cervical Rotation AROM  - 3-4 x daily - 7 x weekly - 1 sets - 5 reps - 5-10seconds hold - Standing Cervical Sidebending AROM  - 3-4 x daily - 7 x weekly - 1 sets - 5 reps - 5-10second hold - Standing Cervical Retraction  - 3-4 x daily - 7 x weekly - 1 sets - 5 reps - 5-10second hold - Single Arm Bent Over Shoulder Extension with Dumbbell  - 1 x daily - 7 x weekly -  3 sets - 10 reps - Bent Over Single Arm Shoulder Row with Dumbbell  - 1 x daily - 7 x weekly - 3 sets - 10 reps - Single Arm Bent Over Shoulder Horizontal Abduction with Dumbbell - Palm Down  - 1 x daily - 7 x weekly - 3 sets - 10 reps - Sidelying Shoulder External Rotation Dumbbell  - 1 x daily - 7 x weekly - 3 sets - 10 reps - Single Arm Serratus Punches in Supine with Dumbbell  - 1 x daily - 7 x weekly - 3 sets - 10 reps - Standing Bilateral Low Shoulder Row with Anchored Resistance  - 1 x daily - 7 x weekly - 2 sets - 10 reps - Shoulder extension with resistance - Neutral  - 1 x daily - 7 x weekly - 2 sets - 10 reps - Standing Elbow Extension with Anchored Resistance  - 1 x daily - 7 x weekly - 2 sets - 10 reps  ASSESSMENT:  CLINICAL IMPRESSION: Thula was able to tolerate D2 PNF today for all 10 reps on each side.  She was also less sensitive at  the sub-occipital area.  She completed all activities with minimal pain.   She would likely benefit from continuing skilled PT for postural strengthening and left shoulder stabilization along with c spine ROM.    OBJECTIVE IMPAIRMENTS: decreased mobility, decreased ROM, decreased strength, hypomobility, increased fascial restrictions, increased muscle spasms, impaired flexibility, impaired UE functional use, postural dysfunction, and pain.   ACTIVITY LIMITATIONS: carrying, lifting, sleeping, transfers, bed mobility, bathing, toileting, and dressing  PARTICIPATION LIMITATIONS: meal prep, cleaning, laundry, driving, shopping, and church  PERSONAL FACTORS: Age, Fitness, Past/current experiences, and 1-2 comorbidities: multiple fusions, OA  are also affecting patient's functional outcome.   REHAB POTENTIAL: Good  CLINICAL DECISION MAKING: Stable/uncomplicated  EVALUATION COMPLEXITY: Low  GOALS: Goals reviewed with patient? Yes  SHORT TERM GOALS: Target date: 09/02/2022   Patient will be independent with initial HEP  Baseline: Goal status:  partially met   2.  Pain report to be no greater than 4/10  Baseline:  Goal status: ongoing  LONG TERM GOALS: Target date: 09/30/2022   Patient to be independent with advanced HEP  Baseline:  Goal status: INITIAL  2.  Patient to report pain no greater than 2/10  Baseline:  Goal status: INITIAL  3.  Patient to be able to sleep through the night  Baseline:  Goal status: INITIAL  4.  Patient will be able to reach overhead into cabinets and on top of shelves without pain  Baseline:  Goal status: INITIAL  5.  Patient to be able to stay involved in her church activities Baseline:  Goal status: INITIAL  6.  C spine ROM to improve to [facility] Baseline:  Goal status: INITIAL  PLAN: PT FREQUENCY: 2x/week  PT DURATION: 8 weeks  PLANNED INTERVENTIONS: Therapeutic exercises, Therapeutic activity, Neuromuscular re-education, Balance training, Patient/Family education, Self Care, Joint mobilization, Stair training, DME instructions, Aquatic Therapy, Dry Needling, Electrical stimulation, Spinal manipulation, Cryotherapy, Moist heat, Taping, Vasopneumatic device, Traction, Ultrasound, Ionotophoresis 4mg /ml Dexamethasone, Manual therapy, and Re-evaluation  PLAN FOR NEXT SESSION:Patient requested to hold on DN, having too much soreness.   Work on [employer] strength, postural strength, cervical/thoracic mobility  Caasi Giglia B. Shenee Wignall, PT 09/20/22 2:07 PM  [facility] [address] [place], [place] [address] Phone # [phone] Fax [phone]

## 2022-09-20 NOTE — Progress Notes (Signed)
Because this visit was a virtual/telehealth visit,  certain criteria was not obtained, such a blood pressure, CBG if patient is a diabetic, and timed get up and go. Any medications not marked as "taking" was not mentioned during the medication reconciliation part of the visit. Any vitals not documented were not able to be obtained due to this being a telehealth visit. Vitals that have been documented are verbally provided by the patient.  Patient was unable to self-report a recent blood pressure reading due to a lack of equipment at home via telehealth.  Subjective:   [name] is a 67 y.o. female who presents for Medicare Annual (Subsequent) preventive examination.  Visit Complete: Virtual  I connected with  [name] on 09/20/22 by a audio enabled telemedicine application and verified that I am speaking with the correct person using two identifiers.  Patient Location: Home  Provider Location: Home Office  I discussed the limitations of evaluation and management by telemedicine. The patient expressed understanding and agreed to proceed.  Patient Medicare AWV questionnaire was completed by the patient on n/a; I have confirmed that all information answered by patient is correct and no changes since this date.  Review of Systems     Cardiac Risk Factors include: advanced age (>31men, >80 women);dyslipidemia;hypertension     Objective:    Today's Vitals   09/20/22 1346 09/20/22 1348  Weight: 162 lb (73.5 kg)   Height: 5' 7.5" (1.715 m)   PainSc:  6    Body mass index is 25 kg/m.     09/20/2022    1:46 PM 08/05/2022    9:40 AM 02/04/2022   11:54 AM 09/06/2021   10:05 PM 08/03/2020    9:14 AM 03/25/2020   10:19 AM 03/03/2020    6:31 AM  Advanced Directives  Does Patient Have a Medical Advance Directive? No Yes Yes No;Yes Yes Yes Yes  Type of Advance Directive  Living will;Healthcare Power of [employer] Power of [place];Living will  Living will Healthcare  Power of [place];Living will Healthcare Power of [place];Living will  Does patient want to make changes to medical advance directive?  No - Patient declined No - Patient declined  No - Patient declined  No - Patient declined  Copy of Healthcare Power of Attorney in Chart?  No - copy requested No - copy requested    No - copy requested  Would patient like information on creating a medical advance directive? No - Patient declined          Current Medications (verified) Outpatient Encounter Medications as of 09/20/2022  Medication Sig   acetaminophen (TYLENOL) 325 MG tablet Take 650 mg by mouth as needed.   ALPRAZolam (XANAX) 0.5 MG tablet Take 0.5 mg by mouth 3 (three) times daily as needed for anxiety.   anastrozole (ARIMIDEX) 1 MG tablet TAKE 1 TABLET BY MOUTH EVERY DAY   Atogepant (QULIPTA) 60 MG TABS Take 1 tablet (60 mg total) by mouth daily for 90 doses.   Azelaic Acid 15 % gel Apply 1 Application topically 2 (two) times daily.   CALCIUM PO Take 2 tablets by mouth daily.   cetirizine (ZYRTEC) 10 MG tablet Take 10 mg by mouth daily.   Cyanocobalamin 1000 MCG/ML KIT Inject 1,000 mcg every 30 (thirty) days as directed.   doxycycline (VIBRAMYCIN) 50 MG capsule Take 50 mg by mouth daily.   DULoxetine (CYMBALTA) 60 MG capsule Take 1 capsule (60 mg total) by mouth daily.   eletriptan (RELPAX)  40 MG tablet Take 1 tablet (40 mg total) by mouth daily as needed for migraine. May repeat in 2 hours if necessary   ELIQUIS 5 MG TABS tablet TAKE 1 TABLET BY MOUTH TWICE A DAY   famotidine (PEPCID) 20 MG tablet Take 20 mg by mouth daily as needed for heartburn or indigestion.   gabapentin (NEURONTIN) 600 MG tablet Take 600 mg by mouth 2 (two) times daily.   ketoconazole (NIZORAL) 2 % cream Apply to corner of mouth nightly   levothyroxine (SYNTHROID, LEVOTHROID) 100 MCG tablet Take 100 mcg daily before breakfast by mouth.   Melatonin 5 MG CAPS Take 5 mg by mouth at bedtime.   Menthol (BIOFREEZE) 10 %  AERO    Omega-3 Fatty Acids (FISH OIL) 1000 MG CAPS 1 capsule   omeprazole (PRILOSEC) 20 MG capsule Take 1 capsule (20 mg total) by mouth daily.   polyethylene glycol (MIRALAX / GLYCOLAX) packet Take 17 g by mouth every evening.   propranolol (INDERAL) 60 MG tablet Take 1 tablet (60 mg total) by mouth daily.   spironolactone (ALDACTONE) 25 MG tablet TAKE 1/2 TABLET BY MOUTH DAILY   Varenicline Tartrate (TYRVAYA) 0.03 MG/ACT SOLN Place into the nose.   VITAMIN D PO Take 2 capsules by mouth daily.   No facility-administered encounter medications on file as of 09/20/2022.    Allergies (verified) Other, Iodine, Tape, Codeine, Fentanyl, Lexapro [escitalopram], and Morphine and codeine   History: Past Medical History:  Diagnosis Date   AF (paroxysmal atrial fibrillation) (HCC)    Arthritis    BCC (basal cell carcinoma) 03/11/2003   left distal lower leg ankle-tx dr Danella Deis   Biventricular cardiac pacemaker in situ    a. prior CRT-D in 2013 with downgrade to CRT-Pacemaker only in 11/2016.   Breast cancer (HCC) 12/12/2019   Chronic systolic CHF (congestive heart failure) (HCC)    Complication of anesthesia    aspiration   Essential tremor    GERD (gastroesophageal reflux disease)    otc   Hypertension    Hypothyroidism    LBBB (left bundle branch block)    conduction disorder   Migraines    Mild aortic insufficiency    NICM (nonischemic cardiomyopathy) (HCC)    a. 2012: normal coronaries, EF 25-30%. b. improved to 55% 11/2016.   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   in situ- right lower leg-anterior ([id number])   Scoliosis    Past Surgical History:  Procedure Laterality Date   ARTERY BIOPSY  12/29/2011   Procedure: MINOR BIOPSY TEMPORAL ARTERY;  Surgeon: Mariella Saa, MD;  Location: [facility];  Service: General;  Laterality: Right;  Right temporal artery biopsy   BACK SURGERY  12/31/2009   "for flat back syndrome; fused bottom of my back"   BI-VENTRICULAR IMPLANTABLE CARDIOVERTER DEFIBRILLATOR Left 01/20/2011   Procedure: BI-VENTRICULAR IMPLANTABLE CARDIOVERTER DEFIBRILLATOR  (CRT-D);  Surgeon: Marinus Maw, MD;  Location: [facility] CATH LAB;  Service: Cardiovascular;  Laterality: Left;   BIV ICD GENERATOR CHANGEOUT N/A 11/24/2016   Procedure: BIV ICD GENERATOR CHANGEOUT;  Surgeon: Marinus Maw, MD;  Location: [facility] INVASIVE CV LAB;  Service: Cardiovascular;  Laterality: N/A;   BREAST LUMPECTOMY WITH RADIOACTIVE SEED LOCALIZATION Left 01/22/2020   Procedure: LEFT BREAST  LUMPECTOMY WITH RADIOACTIVE SEED LOCALIZATION;  Surgeon: Manus Rudd, MD;  Location: [facility];  Service: General;  Laterality: Left;  LMA   CHOLECYSTECTOMY  ~1996   FOOT NEUROMA SURGERY  ~ 2003   right   RE-EXCISION OF BREAST LUMPECTOMY Left 03/03/2020   Procedure: RE-EXCISION MARGINS OF LEFT BREAST LUMPECTOMY;  Surgeon: Manus Rudd, MD;  Location: [facility];  Service: General;  Laterality: Left;   ROTATOR CUFF REPAIR  ~ 2009   left   SHOULDER ARTHROSCOPY W/ ROTATOR CUFF REPAIR Right 09/21/2011   SKIN CANCER EXCISION     nose, forehead, left leg   TEE WITHOUT CARDIOVERSION N/A 03/01/2017   Procedure: TRANSESOPHAGEAL ECHOCARDIOGRAM (TEE);  Surgeon: Chilton Si, MD;  Location: [facility] ENDOSCOPY;  Service: Cardiovascular;  Laterality: N/A;   THYROID LOBECTOMY  ~ 2006   right   TUBAL LIGATION  ~ 1983   VAGINAL HYSTERECTOMY  ~ 2009   VESICOVAGINAL FISTULA CLOSURE W/ TAH     Family History  Problem Relation Age of Onset   Heart disease Mother    Heart failure Mother 50       CHF   Stroke Mother    Hypertension Mother    Heart disease Father 62   Macular degeneration Father    Heart disease Sister    Atrial fibrillation  Sister    Heart disease Sister    Social History   Socioeconomic History   Marital status: Married    Spouse name: Kathlene November   Number of children: 1   Years of education: MA   Highest education level: Master's degree (e.g., MA, MS, MEng, MEd, MSW, MBA)  Occupational History   Occupation: Teacher    Comment: n/a  Tobacco Use   Smoking status: Never    Passive exposure: Never   Smokeless tobacco: Never  Vaping Use   Vaping status: Never Used  Substance and Sexual Activity   Alcohol use: No   Drug use: No   Sexual activity: Yes  Other Topics Concern   Not on file  Social History Narrative   Patient lives at home with family.   Caffeine Use: 1-2 cups daily   Social Determinants of Health   Financial Resource Strain: Low Risk  (09/20/2022)   Overall Financial Resource Strain (CARDIA)    Difficulty of Paying Living Expenses: Not hard at all  Food Insecurity: No Food Insecurity (09/20/2022)   Hunger Vital Sign    Worried About Running Out of Food in the Last Year: Never true    Ran Out of Food in the Last Year: Never true  Transportation Needs: No Transportation Needs (09/20/2022)   PRAPARE - Administrator, Civil Service (Medical): No    Lack of Transportation (Non-Medical): No  Physical Activity: Insufficiently Active (09/20/2022)   Exercise Vital Sign    Days of Exercise per Week: 4 days    Minutes of Exercise per Session: 30 min  Stress: No Stress Concern Present (09/20/2022)   [employer] of Occupational Health - Occupational Stress Questionnaire    Feeling of Stress : Not at all  Recent Concern: Stress - Stress Concern Present (08/16/2022)   [employer] of Occupational Health - Occupational Stress Questionnaire    Feeling of Stress : To some extent  Social Connections: Socially Integrated (09/20/2022)   Social Connection and Isolation Panel [NHANES]    Frequency of Communication with Friends and Family: More than three times a week    Frequency of  Social  Gatherings with Friends and Family: More than three times a week    Attends Religious Services: More than 4 times per year    Active Member of Clubs or Organizations: Yes    Attends Engineer, structural: More than 4 times per year    Marital Status: Married    Tobacco Counseling Counseling given: Yes   Clinical Intake:  Pre-visit preparation completed: Yes  Pain : 0-10 Pain Score: 6  Pain Type: Chronic pain Pain Location: Neck (and left shoulder) Pain Orientation: Posterior, Left, Right Pain Descriptors / Indicators: Throbbing, Burning, Sharp Pain Onset: More than a month ago Pain Frequency: Intermittent     BMI - recorded: 25 Nutritional Status: BMI 25 -29 Overweight Nutritional Risks: None Diabetes: No  How often do you need to have someone help you when you read instructions, pamphlets, or other written materials from your doctor or pharmacy?: 1 - Never  Interpreter Needed?: No  Information entered by :: Abby Iceis Knab,CMA   Activities of Daily Living    09/20/2022    1:56 PM  In your present state of health, do you have any difficulty performing the following activities:  Hearing? 1  Comment patient states she has a little difficulty hearing  Vision? 0  Difficulty concentrating or making decisions? 0  Walking or climbing stairs? 0  Dressing or bathing? 0  Doing errands, shopping? 0  Preparing Food and eating ? N  Using the Toilet? N  In the past six months, have you accidently leaked urine? N  Do you have problems with loss of bowel control? N  Managing your Medications? N  Managing your Finances? N  Housekeeping or managing your Housekeeping? Y  Comment has someone come in every 2 weeks to do deep cleaning    Patient Care Team: Alyson Reedy, FNP as PCP - General (Family Medicine) Marinus Maw, MD as PCP - Electrophysiology (Cardiology) Jodelle Red, MD as PCP - Cardiology (Cardiology) Jerene Bears, MD as Consulting  Physician (Gynecology) Ladene Artist, MD as Consulting Physician (Oncology)  Indicate any recent Medical Services you may have received from other than [facility] providers in the past year (date may be approximate).     Assessment:   This is a routine wellness examination for [name].  Hearing/Vision screen Hearing Screening - Comments:: Patient states she has a little bit of difficulty hearing but declines referral at this time  Vision Screening - Comments:: Patient is up to date with yearly eye exams with Dr. Cathey Endow. Wears rx glasses - up to date with routine eye exams     Goals Addressed             This Visit's Progress    Patient Stated       To have no neck pain or shoulder pain        Depression Screen    09/20/2022    1:51 PM 07/20/2022    3:09 PM 03/30/2022    9:25 AM 02/14/2022    8:35 AM 11/26/2020    4:28 PM 09/02/2020   10:56 AM  PHQ 2/9 Scores  PHQ - 2 Score 0 1 0 0 0 0  PHQ- 9 Score  7      Exception Documentation  Medical reason        Fall Risk    09/20/2022    1:56 PM 07/20/2022    3:09 PM  Fall Risk   Falls in the past year? 0 0  Number falls in past  yr: 0 0  Injury with Fall? 0 0  Risk for fall due to : No Fall Risks No Fall Risks  Follow up Falls prevention discussed Falls evaluation completed    MEDICARE RISK AT HOME: Medicare Risk at Home Any stairs in or around the home?: Yes If so, are there any without handrails?: No Home free of loose throw rugs in walkways, pet beds, electrical cords, etc?: Yes Adequate lighting in your home to reduce risk of falls?: Yes Life alert?: No Use of a cane, walker or w/c?: No Grab bars in the bathroom?: Yes Shower chair or bench in shower?: Yes Elevated toilet seat or a handicapped toilet?: Yes  TIMED UP AND GO:  Was the test performed?  No    Cognitive Function:        09/20/2022    1:51 PM  6CIT Screen  What Year? 0 points  What month? 0 points  What time? 0 points  Count back from 20 0  points  Months in reverse 0 points  Repeat phrase 0 points  Total Score 0 points    Immunizations Immunization History  Administered Date(s) Administered   Influenza Split 10/26/2017   Influenza, High Dose Seasonal PF 09/11/2018, 11/06/2019, 11/06/2020   Influenza,inj,Quad PF,6+ Mos 09/29/2015, 09/18/2016, 11/06/2017   Influenza,inj,quad, With Preservative 10/30/2012, 11/24/2014, 11/06/2017   Influenza-Unspecified 11/10/2021   PFIZER(Purple Top)SARS-COV-2 Vaccination 01/31/2019, 02/26/2019, 09/24/2019, 07/07/2020   Pfizer Covid-19 Vaccine Bivalent Booster 5yrs & up 03/05/2021   Pneumococcal Conjugate-13 01/09/2013   Pneumococcal Polysaccharide-23 11/17/2010, 10/21/2014   Tdap 02/10/2015, 06/03/2018   Zoster, Live 10/21/2014, 01/02/2019, 05/08/2019    TDAP status: Up to date  Flu Vaccine status: Due, Education has been provided regarding the importance of this vaccine. Advised may receive this vaccine at local pharmacy or Health Dept. Aware to provide a copy of the vaccination record if obtained from local pharmacy or Health Dept. Verbalized acceptance and understanding.  Pneumococcal vaccine status: Due, Education has been provided regarding the importance of this vaccine. Advised may receive this vaccine at local pharmacy or Health Dept. Aware to provide a copy of the vaccination record if obtained from local pharmacy or Health Dept. Verbalized acceptance and understanding.  Covid-19 vaccine status: Information provided on how to obtain vaccines.   Qualifies for Shingles Vaccine? Yes   Zostavax completed No   Shingrix Completed?: No.    Education has been provided regarding the importance of this vaccine. Patient has been advised to call insurance company to determine out of pocket expense if they have not yet received this vaccine. Advised may also receive vaccine at local pharmacy or Health Dept. Verbalized acceptance and understanding.  Screening Tests Health Maintenance   Topic Date Due   Hepatitis C Screening  Never done   Zoster Vaccines- Shingrix (1 of 2) 10/19/1972   Colonoscopy  Never done   Medicare Annual Wellness (AWV)  03/16/2016   Pneumonia Vaccine 94+ Years old (3 of 3 - PPSV23 or PCV20) 10/21/2019   INFLUENZA VACCINE  08/11/2022   COVID-19 Vaccine (6 - 2023-24 season) 09/11/2022   MAMMOGRAM  12/25/2023   DTaP/Tdap/Td (3 - Td or Tdap) 06/02/2028   DEXA SCAN  Completed   HPV VACCINES  Aged Out    Health Maintenance  Health Maintenance Due  Topic Date Due   Hepatitis C Screening  Never done   Zoster Vaccines- Shingrix (1 of 2) 10/19/1972   Colonoscopy  Never done   Medicare Annual Wellness (AWV)  03/16/2016   Pneumonia  Vaccine 15+ Years old (3 of 3 - PPSV23 or PCV20) 10/21/2019   INFLUENZA VACCINE  08/11/2022   COVID-19 Vaccine (6 - 2023-24 season) 09/11/2022    Colorectal Cancer Screening: Per patient, she has a colonoscopy every 2 years. Will request record from Dr. Levora Angel  Mammogram status: Ordered 09/20/2022. Pt provided with contact info and advised to call to schedule appt.   Bone Density Status: Dexa ordered 09/20/2022. Per patient, she has to have screening every year. Will request records.   Lung Cancer Screening: (Low Dose CT Chest recommended if Age 72-80 years, 20 pack-year currently smoking OR have quit w/in 15years.) does not qualify.   Lung Cancer Screening Referral: n/a  Additional Screening:  Hepatitis C Screening: does qualify; patient declined  Vision Screening: Recommended annual ophthalmology exams for early detection of glaucoma and other disorders of the eye. Is the patient up to date with their annual eye exam?  Yes  Who is the provider or what is the name of the office in which the patient attends annual eye exams? Dr. Cathey Endow If pt is not established with a provider, would they like to be referred to a provider to establish care? No .   Dental Screening: Recommended annual dental exams for proper oral  hygiene  Diabetic Foot Exam: n/a  Community Resource Referral / Chronic Care Management: CRR required this visit?  No   CCM required this visit?  No     Plan:     I have personally reviewed and noted the following in the patient's chart:   Medical and social history Use of alcohol, tobacco or illicit drugs  Current medications and supplements including opioid prescriptions. Patient is not currently taking opioid prescriptions. Functional ability and status Nutritional status Physical activity Advanced directives List of other physicians Hospitalizations, surgeries, and ER visits in previous 12 months Vitals Screenings to include cognitive, depression, and falls Referrals and appointments  In addition, I have reviewed and discussed with patient certain preventive protocols, quality metrics, and best practice recommendations. A written personalized care plan for preventive services as well as general preventive health recommendations were provided to patient.     Jordan Hawks Mardella Nuckles, CMA   09/20/2022   After Visit Summary: (MyChart) Due to this being a telephonic visit, the after visit summary with patients personalized plan was offered to patient via MyChart   Nurse Notes: Records have been requested for colonoscopy, mammogram, and bone density screening.

## 2022-09-22 ENCOUNTER — Other Ambulatory Visit: Payer: Self-pay | Admitting: Specialist

## 2022-09-22 ENCOUNTER — Encounter: Payer: Self-pay | Admitting: Specialist

## 2022-09-22 DIAGNOSIS — M542 Cervicalgia: Secondary | ICD-10-CM

## 2022-09-23 ENCOUNTER — Ambulatory Visit: Payer: Medicare PPO

## 2022-09-23 ENCOUNTER — Ambulatory Visit
Admission: RE | Admit: 2022-09-23 | Discharge: 2022-09-23 | Disposition: A | Discharge status: Home / Self Care | Payer: Medicare PPO | Source: Ambulatory Visit | Attending: Specialist | Admitting: Specialist

## 2022-09-23 DIAGNOSIS — R293 Abnormal posture: Secondary | ICD-10-CM

## 2022-09-23 DIAGNOSIS — M25612 Stiffness of left shoulder, not elsewhere classified: Secondary | ICD-10-CM

## 2022-09-23 DIAGNOSIS — G8929 Other chronic pain: Secondary | ICD-10-CM

## 2022-09-23 DIAGNOSIS — M542 Cervicalgia: Secondary | ICD-10-CM

## 2022-09-23 DIAGNOSIS — M25512 Pain in left shoulder: Secondary | ICD-10-CM | POA: Diagnosis not present

## 2022-09-23 DIAGNOSIS — M6281 Muscle weakness (generalized): Secondary | ICD-10-CM

## 2022-09-23 DIAGNOSIS — R252 Cramp and spasm: Secondary | ICD-10-CM

## 2022-09-23 NOTE — Therapy (Unsigned)
OUTPATIENT PHYSICAL THERAPY TREATMENT   Patient Name: [name] MRN: [id number] DOB:08-22-1955, 67 y.o., female Today's Date: 09/24/2022  END OF SESSION:  PT End of Session - 09/23/22 1015     Visit Number 9    Date for PT Re-Evaluation 09/30/22    Authorization Type [employer]    Authorization Time Period 16 visits. 08/05/2022-09/30/2022    Authorization - Number of Visits 16    Progress Note Due on Visit 10    PT Start Time 1015    PT Stop Time 1100    PT Time Calculation (min) 45 min    Activity Tolerance Patient limited by pain;Patient tolerated treatment well    Behavior During Therapy [facility] for tasks assessed/performed                 Past Medical History:  Diagnosis Date   AF (paroxysmal atrial fibrillation) (HCC)    Arthritis    BCC (basal cell carcinoma) 03/11/2003   left distal lower leg ankle-tx dr Danella Deis   Biventricular cardiac pacemaker in situ    a. prior CRT-D in 2013 with downgrade to CRT-Pacemaker only in 11/2016.   Breast cancer (HCC) 12/12/2019   Chronic systolic CHF (congestive heart failure) (HCC)    Complication of anesthesia    aspiration   Essential tremor    GERD (gastroesophageal reflux disease)    otc   Hypertension    Hypothyroidism    LBBB (left bundle branch block)    conduction disorder   Migraines    Mild aortic insufficiency    NICM (nonischemic cardiomyopathy) (HCC)    a. 2012: normal coronaries, EF 25-30%. b. improved to 55% 11/2016.   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   in situ- right lower leg-anterior ([id number])   Scoliosis    Past Surgical History:  Procedure Laterality Date   ARTERY BIOPSY  12/29/2011   Procedure: MINOR BIOPSY TEMPORAL ARTERY;  Surgeon: Mariella Saa, MD;  Location:  [facility];  Service: General;  Laterality: Right;  Right temporal artery biopsy   BACK SURGERY  12/31/2009   "for flat back syndrome; fused bottom of my back"   BI-VENTRICULAR IMPLANTABLE CARDIOVERTER DEFIBRILLATOR Left 01/20/2011   Procedure: BI-VENTRICULAR IMPLANTABLE CARDIOVERTER DEFIBRILLATOR  (CRT-D);  Surgeon: Marinus Maw, MD;  Location: [facility] CATH LAB;  Service: Cardiovascular;  Laterality: Left;   BIV ICD GENERATOR CHANGEOUT N/A 11/24/2016   Procedure: BIV ICD GENERATOR CHANGEOUT;  Surgeon: Marinus Maw, MD;  Location: [facility] INVASIVE CV LAB;  Service: Cardiovascular;  Laterality: N/A;   BREAST LUMPECTOMY WITH RADIOACTIVE SEED LOCALIZATION Left 01/22/2020   Procedure: LEFT BREAST LUMPECTOMY WITH RADIOACTIVE SEED LOCALIZATION;  Surgeon: Manus Rudd, MD;  Location: [facility];  Service: General;  Laterality: Left;  LMA   CHOLECYSTECTOMY  ~1996   FOOT NEUROMA SURGERY  ~ 2003   right   RE-EXCISION OF BREAST LUMPECTOMY Left 03/03/2020   Procedure: RE-EXCISION MARGINS OF LEFT BREAST LUMPECTOMY;  Surgeon: Manus Rudd, MD;  Location:  SURGERY CENTER;  Service: General;  Laterality: Left;   ROTATOR CUFF REPAIR  ~ 2009   left   SHOULDER ARTHROSCOPY W/ ROTATOR CUFF REPAIR Right 09/21/2011   SKIN CANCER EXCISION     nose, forehead, left  leg   TEE WITHOUT CARDIOVERSION N/A 03/01/2017   Procedure: TRANSESOPHAGEAL ECHOCARDIOGRAM (TEE);  Surgeon: Chilton Si, MD;  Location: [facility] ENDOSCOPY;  Service: Cardiovascular;  Laterality: N/A;   THYROID LOBECTOMY  ~ 2006   right   TUBAL LIGATION  ~ 1983   VAGINAL HYSTERECTOMY  ~ 2009   VESICOVAGINAL FISTULA CLOSURE W/ TAH     Patient Active Problem List   Diagnosis Date Noted   Biventricular ICD (implantable cardioverter-defibrillator) in place    DOE (dyspnea on exertion) 04/07/2021   Age-related osteoporosis without current pathological fracture 11/29/2020   Atrophy of vagina 11/29/2020   Mild  recurrent major depression (HCC) 11/29/2020   Encounter for counseling 09/17/2020   Breast CA (HCC) 02/18/2020   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   Rosacea 11/05/2019   Scar 11/05/2019   Biventricular cardiac pacemaker in situ 10/08/2018   Long term current use of anticoagulant therapy 10/23/2017   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   Trigeminal neuralgia 12/14/2012   Insomnia, persistent 11/03/2011   AICD (automatic cardioverter/defibrillator) present    Aortic regurgitation    Nonischemic cardiomyopathy (HCC) 01/21/2011   Esophagitis, reflux 08/24/2010   Idiopathic peripheral neuropathy 08/24/2010   Chronic systolic CHF (congestive heart failure) (HCC) 02/24/2010   Paroxysmal atrial fibrillation (HCC) 02/23/2010   Hypothyroidism    History of migraine headaches    Left bundle branch block    Scoliosis     Class: Chronic   Lumbar canal stenosis 12/25/2009   Anxiety state 07/24/2008   Avitaminosis D 03/21/2008   Atypical migraine 01/30/2008   Benign essential HTN 01/30/2008   Paroxysmal digital cyanosis 02/15/2007    PCP: Cammy Copa, MD   REFERRING PROVIDER: Cammy Copa, MD  REFERRING DIAG: [phone] (ICD-10-CM) - Arthritis of left shoulder region  THERAPY DIAG:  Chronic left shoulder pain  Cramp and spasm  Muscle weakness (generalized)  Stiffness of left shoulder, not elsewhere classified  Abnormal posture  Rationale for Evaluation and Treatment: Rehabilitation  ONSET DATE: 06/20/2022  SUBJECTIVE:                                                                                                                                                                                      SUBJECTIVE STATEMENT: Patient reports that she had trigger point injections in her neck yesterday and that she is very sore.    Eval: Patient reports on going shoulder and neck issues for several months.  She is unable to use her arms overhead without  pain.  She is able to do most of her usual community activities but has felt like cutting back on activities due to  her constant pain. She states she lives with her husband.  Son lives out of town.  She has 2 dogs that she walks daily.  I feel like I just don't have a lot of energy.  I'm on new medication that is an anti-depressant that is supposed to help with bone aches and pain.  I help at the church at times.  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  I still fix supper and clean my own house.  She was a Engineer, site.  Has someone come in to help with heavy duty cleaning once every two weeks.  Hx of spinal surgery/ fusion. Hand dominance: Right  PERTINENT HISTORY: Lumbar fusion to pelvic fusion  PAIN:  Are you having pain? Yes, left  and right neck 6/10 and left shoulder cracks   PRECAUTIONS: None  RED FLAGS: None   WEIGHT BEARING RESTRICTIONS: No  FALLS:  Has patient fallen in last 6 months? No  LIVING ENVIRONMENT: Lives with: lives with their spouse Lives in: House/apartment   OCCUPATION: Retired  PLOF: Independent, Independent with basic ADLs, Independent with household mobility without device, Independent with community mobility without device, Independent with homemaking with ambulation, Independent with gait, and Independent with transfers  PATIENT GOALS: to be able to use my arms without pain and for my neck to stop hurting  NEXT MD VISIT: prn  OBJECTIVE:   DIAGNOSTIC FINDINGS:  Patient not candidate for MRI due to pacemaker  COGNITION: Overall cognitive status: Within functional limits for tasks assessed     SENSATION: WFL  POSTURE: Rounded shoulders/ forward head  UPPER EXTREMITY ROM:   WFL but with painful arc and end range pain  UPPER EXTREMITY MMT:  Generally 4/5 left UE with exception of ER which is 4-/5  SHOULDER SPECIAL TESTS: Impingement tests: Painful arc test: positive     TODAY'S TREATMENT:        DATE: 09/23/22 Seated chin tucks 3 way (extension, bila side bending) x10 each  Seated Isometric flexion & extension (manual resistance) 5 x 5 sec holds each Shoulder Rolls x10 Seated Isometric rotation (manual resistance) 5 x 5 sec holds each way Seated shoulder extension with red band 2 x 10 Seated shoulder rows with red band 2 x 10 Seated bilateral shoulder ER with yellow band 2 x 10 Seated shoulder abduction with yellow band 2 x 10 Manual: upper trap and levator stretch bilaterally, STM to left and right upper trap, PROM c spine all planes of motion,  sub occipital release.  09/20/22 Nustep level 2 x 5 min-PT present to discuss progress 3 way scapular stabilization with green loop x 5 on each side Standing shoulder flexion and scaption x 10 with 0# in front of mirror to monitor compensation both Seated bilateral shoulder ER with yellow band 2 x 10 Supine horizontal abd with yellow band 2 x 10 both Supine D2 PNF with both shoulders using yellow band x 10 each side AAROM using dowel: flexion, abduction and ER both on all except ER (left only) Supine shoulder alphabet with 1 lb A-Z Manual: upper trap and levator stretch bilaterally, STM to left and right upper trap, PROM c spine all planes of motion,  sub occipital release.    DATE: 09/15/22 Arm bike: level 1x 4 min (2/2)-PT present to discuss progress Standing shoulder flexion and scaption x 10 with 0# in front of mirror to monitor compensation 3 way scapular stabilization with green loop x 5 Seated bilateral  shoulder ER with yellow band 2 x 10 Supine horizontal abd with yellow band 2 x 10 Supine D2 PNF with left shoulder using yellow band x 10 first set, could only do 8 on second set AAROM using dowel: flexion, abduction and ER Supine shoulder alphabet with 1 lb A-Z Manual: upper trap and levator stretch bilaterally, STM to left and right upper trap, PROM c spine all planes of motion,  attempted sub occipital release but patient  stated this was painful.      PATIENT EDUCATION: Education details: initiated hep Person educated: Patient Education method: Programmer, multimedia, Facilities manager, Verbal cues, and Handouts Education comprehension: verbalized understanding and returned demonstration  HOME EXERCISE PROGRAM: Access Code: [email or web address] URL: [email or web address] Date: 08/11/2022 Prepared by: Lavinia Sharps  Exercises - Standing Cervical Flexion AROM  - 3-4 x daily - 7 x weekly - 1 sets - 5 reps - 5-10 seconds hold - Standing Cervical Rotation AROM  - 3-4 x daily - 7 x weekly - 1 sets - 5 reps - 5-10seconds hold - Standing Cervical Sidebending AROM  - 3-4 x daily - 7 x weekly - 1 sets - 5 reps - 5-10second hold - Standing Cervical Retraction  - 3-4 x daily - 7 x weekly - 1 sets - 5 reps - 5-10second hold - Single Arm Bent Over Shoulder Extension with Dumbbell  - 1 x daily - 7 x weekly - 3 sets - 10 reps - Bent Over Single Arm Shoulder Row with Dumbbell  - 1 x daily - 7 x weekly - 3 sets - 10 reps - Single Arm Bent Over Shoulder Horizontal Abduction with Dumbbell - Palm Down  - 1 x daily - 7 x weekly - 3 sets - 10 reps - Sidelying Shoulder External Rotation Dumbbell  - 1 x daily - 7 x weekly - 3 sets - 10 reps - Single Arm Serratus Punches in Supine with Dumbbell  - 1 x daily - 7 x weekly - 3 sets - 10 reps - Standing Bilateral Low Shoulder Row with Anchored Resistance  - 1 x daily - 7 x weekly - 2 sets - 10 reps - Shoulder extension with resistance - Neutral  - 1 x daily - 7 x weekly - 2 sets - 10 reps - Standing Elbow Extension with Anchored Resistance  - 1 x daily - 7 x weekly - 2 sets - 10 reps  ASSESSMENT:  CLINICAL IMPRESSION: Today's treatment session focused on scapular strengthening and re-training cervical motions. Patient tolerated all strengthening exercises well without having increased pain. Patient responded favorably to manual therapy to upper trap and suboccipital muscles. Patient will  continue to benefit from skilled therapy to address current impairments and functional limitations.  OBJECTIVE IMPAIRMENTS: decreased mobility, decreased ROM, decreased strength, hypomobility, increased fascial restrictions, increased muscle spasms, impaired flexibility, impaired UE functional use, postural dysfunction, and pain.   ACTIVITY LIMITATIONS: carrying, lifting, sleeping, transfers, bed mobility, bathing, toileting, and dressing  PARTICIPATION LIMITATIONS: meal prep, cleaning, laundry, driving, shopping, and church  PERSONAL FACTORS: Age, Fitness, Past/current experiences, and 1-2 comorbidities: multiple fusions, OA  are also affecting patient's functional outcome.   REHAB POTENTIAL: Good  CLINICAL DECISION MAKING: Stable/uncomplicated  EVALUATION COMPLEXITY: Low  GOALS: Goals reviewed with patient? Yes  SHORT TERM GOALS: Target date: 09/02/2022   Patient will be independent with initial HEP  Baseline: Goal status: partially met   2.  Pain report to be no greater than 4/10  Baseline:  Goal status: ongoing  LONG TERM  GOALS: Target date: 09/30/2022   Patient to be independent with advanced HEP  Baseline:  Goal status: INITIAL  2.  Patient to report pain no greater than 2/10  Baseline:  Goal status: INITIAL  3.  Patient to be able to sleep through the night  Baseline:  Goal status: INITIAL  4.  Patient will be able to reach overhead into cabinets and on top of shelves without pain  Baseline:  Goal status: INITIAL  5.  Patient to be able to stay involved in her church activities Baseline:  Goal status: INITIAL  6.  C spine ROM to improve to [facility] Baseline:  Goal status: INITIAL  PLAN: PT FREQUENCY: 2x/week  PT DURATION: 8 weeks  PLANNED INTERVENTIONS: Therapeutic exercises, Therapeutic activity, Neuromuscular re-education, Balance training, Patient/Family education, Self Care, Joint mobilization, Stair training, DME instructions, Aquatic Therapy, Dry  Needling, Electrical stimulation, Spinal manipulation, Cryotherapy, Moist heat, Taping, Vasopneumatic device, Traction, Ultrasound, Ionotophoresis 4mg /ml Dexamethasone, Manual therapy, and Re-evaluation  PLAN FOR NEXT SESSION: Work on [employer] shoulder strength, postural strength, cervical/thoracic mobility  Ritesh Opara B. Kamyla Olejnik, PT 09/24/22 7:25 AM [facility] [address] [place], [place] [address] Phone # [phone] Fax [phone]

## 2022-09-27 ENCOUNTER — Ambulatory Visit: Payer: Medicare PPO

## 2022-09-27 DIAGNOSIS — R293 Abnormal posture: Secondary | ICD-10-CM

## 2022-09-27 DIAGNOSIS — M6281 Muscle weakness (generalized): Secondary | ICD-10-CM

## 2022-09-27 DIAGNOSIS — G8929 Other chronic pain: Secondary | ICD-10-CM

## 2022-09-27 DIAGNOSIS — M25512 Pain in left shoulder: Secondary | ICD-10-CM | POA: Diagnosis not present

## 2022-09-27 DIAGNOSIS — R252 Cramp and spasm: Secondary | ICD-10-CM

## 2022-09-27 DIAGNOSIS — M25612 Stiffness of left shoulder, not elsewhere classified: Secondary | ICD-10-CM

## 2022-09-27 NOTE — Therapy (Signed)
OUTPATIENT PHYSICAL THERAPY TREATMENT   Patient Name: [name] MRN: [id number] DOB:1955/09/14, 67 y.o., female Today's Date: 09/27/2022  END OF SESSION:  PT End of Session - 09/27/22 1116     Visit Number 10    Number of Visits 16    Date for PT Re-Evaluation 09/30/22    Authorization Type [employer]    Authorization Time Period 16 visits. 08/05/2022-09/30/2022    Authorization - Visit Number 10    Authorization - Number of Visits 16    Progress Note Due on Visit 20    PT Start Time 1109    PT Stop Time 1148    PT Time Calculation (min) 39 min    Activity Tolerance Patient limited by pain;Patient tolerated treatment well    Behavior During Therapy [facility] for tasks assessed/performed                 Past Medical History:  Diagnosis Date   AF (paroxysmal atrial fibrillation) (HCC)    Arthritis    BCC (basal cell carcinoma) 03/11/2003   left distal lower leg ankle-tx dr Danella Deis   Biventricular cardiac pacemaker in situ    a. prior CRT-D in 2013 with downgrade to CRT-Pacemaker only in 11/2016.   Breast cancer (HCC) 12/12/2019   Chronic systolic CHF (congestive heart failure) (HCC)    Complication of anesthesia    aspiration   Essential tremor    GERD (gastroesophageal reflux disease)    otc   Hypertension    Hypothyroidism    LBBB (left bundle branch block)    conduction disorder   Migraines    Mild aortic insufficiency    NICM (nonischemic cardiomyopathy) (HCC)    a. 2012: normal coronaries, EF 25-30%. b. improved to 55% 11/2016.   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   in situ- right lower leg-anterior ([id number])   Scoliosis    Past Surgical History:  Procedure Laterality Date   ARTERY BIOPSY  12/29/2011   Procedure: MINOR BIOPSY  TEMPORAL ARTERY;  Surgeon: Mariella Saa, MD;  Location: [facility];  Service: General;  Laterality: Right;  Right temporal artery biopsy   BACK SURGERY  12/31/2009   "for flat back syndrome; fused bottom of my back"   BI-VENTRICULAR IMPLANTABLE CARDIOVERTER DEFIBRILLATOR Left 01/20/2011   Procedure: BI-VENTRICULAR IMPLANTABLE CARDIOVERTER DEFIBRILLATOR  (CRT-D);  Surgeon: Marinus Maw, MD;  Location: [facility] LAB;  Service: Cardiovascular;  Laterality: Left;   BIV ICD GENERATOR CHANGEOUT N/A 11/24/2016   Procedure: BIV ICD GENERATOR CHANGEOUT;  Surgeon: Marinus Maw, MD;  Location: [facility] INVASIVE CV LAB;  Service: Cardiovascular;  Laterality: N/A;   BREAST LUMPECTOMY WITH RADIOACTIVE SEED LOCALIZATION Left 01/22/2020   Procedure: LEFT BREAST LUMPECTOMY WITH RADIOACTIVE SEED LOCALIZATION;  Surgeon: Manus Rudd, MD;  Location: [facility];  Service: General;  Laterality: Left;  LMA   CHOLECYSTECTOMY  ~1996   FOOT NEUROMA SURGERY  ~ 2003   right   RE-EXCISION OF BREAST LUMPECTOMY Left 03/03/2020   Procedure: RE-EXCISION MARGINS OF LEFT BREAST LUMPECTOMY;  Surgeon: Manus Rudd, MD;  Location: [facility];  Service: General;  Laterality: Left;   ROTATOR CUFF REPAIR  ~ 2009   left   SHOULDER ARTHROSCOPY W/ ROTATOR CUFF  REPAIR Right 09/21/2011   SKIN CANCER EXCISION     nose, forehead, left leg   TEE WITHOUT CARDIOVERSION N/A 03/01/2017   Procedure: TRANSESOPHAGEAL ECHOCARDIOGRAM (TEE);  Surgeon: Chilton Si, MD;  Location: [facility] ENDOSCOPY;  Service: Cardiovascular;  Laterality: N/A;   THYROID LOBECTOMY  ~ 2006   right   TUBAL LIGATION  ~ 1983   VAGINAL HYSTERECTOMY  ~ 2009   VESICOVAGINAL FISTULA CLOSURE W/ TAH     Patient Active Problem List   Diagnosis Date Noted   Biventricular ICD (implantable cardioverter-defibrillator) in place    DOE (dyspnea on exertion) 04/07/2021   Age-related osteoporosis without current pathological  fracture 11/29/2020   Atrophy of vagina 11/29/2020   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   Encounter for counseling 09/17/2020   Breast CA (HCC) 02/18/2020   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   Rosacea 11/05/2019   Scar 11/05/2019   Biventricular cardiac pacemaker in situ 10/08/2018   Long term current use of anticoagulant therapy 10/23/2017   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   Trigeminal neuralgia 12/14/2012   Insomnia, persistent 11/03/2011   AICD (automatic cardioverter/defibrillator) present    Aortic regurgitation    Nonischemic cardiomyopathy (HCC) 01/21/2011   Esophagitis, reflux 08/24/2010   Idiopathic peripheral neuropathy 08/24/2010   Chronic systolic CHF (congestive heart failure) (HCC) 02/24/2010   Paroxysmal atrial fibrillation (HCC) 02/23/2010   Hypothyroidism    History of migraine headaches    Left bundle branch block    Scoliosis     Class: Chronic   Lumbar canal stenosis 12/25/2009   Anxiety state 07/24/2008   Avitaminosis D 03/21/2008   Atypical migraine 01/30/2008   Benign essential HTN 01/30/2008   Paroxysmal digital cyanosis 02/15/2007    PCP: Cammy Copa, MD   REFERRING PROVIDER: Cammy Copa, MD  REFERRING DIAG: [phone] (ICD-10-CM) - Arthritis of left shoulder region  THERAPY DIAG:  Chronic left shoulder pain  Cramp and spasm  Muscle weakness (generalized)  Stiffness of left shoulder, not elsewhere classified  Abnormal posture  Rationale for Evaluation and Treatment: Rehabilitation  ONSET DATE: 06/20/2022  SUBJECTIVE:                                                                                                                                                                                      SUBJECTIVE STATEMENT: Patient reports "feeling better"  Pain 3/10.    Eval: Patient reports on going shoulder and neck issues for several months.  She is unable to use her arms overhead without  pain.  She is able to do most of her usual community activities but has felt like cutting back  on activities due to her constant pain. She states she lives with her husband.  Son lives out of town.  She has 2 dogs that she walks daily.  I feel like I just don't have a lot of energy.  I'm on new medication that is an anti-depressant that is supposed to help with bone aches and pain.  I help at the church at times.  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  I still fix supper and clean my own house.  She was a Engineer, site.  Has someone come in to help with heavy duty cleaning once every two weeks.  Hx of spinal surgery/ fusion. Hand dominance: Right  PERTINENT HISTORY: Lumbar fusion to pelvic fusion  PAIN:  Are you having pain? Yes, left  and right neck 6/10 and left shoulder cracks   PRECAUTIONS: None  RED FLAGS: None   WEIGHT BEARING RESTRICTIONS: No  FALLS:  Has patient fallen in last 6 months? No  LIVING ENVIRONMENT: Lives with: lives with their spouse Lives in: House/apartment   OCCUPATION: Retired  PLOF: Independent, Independent with basic ADLs, Independent with household mobility without device, Independent with community mobility without device, Independent with homemaking with ambulation, Independent with gait, and Independent with transfers  PATIENT GOALS: to be able to use my arms without pain and for my neck to stop hurting  NEXT MD VISIT: prn  OBJECTIVE:   DIAGNOSTIC FINDINGS:  Patient not candidate for MRI due to pacemaker  COGNITION: Overall cognitive status: Within functional limits for tasks assessed     SENSATION: WFL  POSTURE: Rounded shoulders/ forward head  UPPER EXTREMITY ROM:   WFL but with painful arc and end range pain  UPPER EXTREMITY MMT:  Generally 4/5 left UE with exception of ER which is 4-/5  SHOULDER SPECIAL TESTS: Impingement tests: Painful arc test: positive     TODAY'S TREATMENT:        DATE: 09/27/22 UBE x 6 min 3 way scapular stabilization x 10 with green loop left 4 D ball rolls with light blue plyo ball x 20 each left Fwd bent shoulder ext 2 x 10 with 1 lb left Fwd bent shoulder rows 2 x 10 with 1 lb left Fwd bent shoulder horizontal abduction 2 x 10 with 1 lb left Side lying left shoulder ER 2 x 10 with 1 lb Supine serratus punch with 1 lb 2 x 10 Manual PROM cervical spine, bilateral upper trap STM, suboccipital release x 9 min  09/23/22 Seated chin tucks 3 way (extension, bila side bending) x10 each  Seated Isometric flexion & extension (manual resistance) 5 x 5 sec holds each Shoulder Rolls x10 Seated Isometric rotation (manual resistance) 5 x 5 sec holds each way Seated shoulder extension with red band 2 x 10 Seated shoulder rows with red band 2 x 10 Seated bilateral shoulder ER with yellow band 2 x 10 Seated shoulder abduction with yellow band 2 x 10 Manual: upper trap and levator stretch bilaterally, STM to left and right upper trap, PROM c spine all planes of motion,  sub occipital release.  09/20/22 Nustep level 2 x 5 min-PT present to discuss progress 3 way scapular stabilization with green loop x 5 on each side Standing shoulder flexion and scaption x 10 with 0# in front of mirror to monitor compensation both Seated bilateral shoulder ER with yellow band 2 x 10 Supine horizontal abd with yellow band 2 x 10 both Supine D2  PNF with both shoulders using yellow band x 10 each side AAROM using dowel: flexion, abduction and ER both on all except ER (left only) Supine shoulder alphabet with 1 lb A-Z Manual: upper trap and levator stretch bilaterally, STM to left and right upper trap, PROM c spine all planes of motion,  sub occipital release.    PATIENT EDUCATION: Education details: initiated hep Person educated: Patient Education method: Programmer, multimedia, Facilities manager, Verbal cues, and Handouts Education comprehension: verbalized understanding and returned  demonstration  HOME EXERCISE PROGRAM: Access Code: [email or web address] URL: [email or web address] Date: 08/11/2022 Prepared by: Lavinia Sharps  Exercises - Standing Cervical Flexion AROM  - 3-4 x daily - 7 x weekly - 1 sets - 5 reps - 5-10 seconds hold - Standing Cervical Rotation AROM  - 3-4 x daily - 7 x weekly - 1 sets - 5 reps - 5-10seconds hold - Standing Cervical Sidebending AROM  - 3-4 x daily - 7 x weekly - 1 sets - 5 reps - 5-10second hold - Standing Cervical Retraction  - 3-4 x daily - 7 x weekly - 1 sets - 5 reps - 5-10second hold - Single Arm Bent Over Shoulder Extension with Dumbbell  - 1 x daily - 7 x weekly - 3 sets - 10 reps - Bent Over Single Arm Shoulder Row with Dumbbell  - 1 x daily - 7 x weekly - 3 sets - 10 reps - Single Arm Bent Over Shoulder Horizontal Abduction with Dumbbell - Palm Down  - 1 x daily - 7 x weekly - 3 sets - 10 reps - Sidelying Shoulder External Rotation Dumbbell  - 1 x daily - 7 x weekly - 3 sets - 10 reps - Single Arm Serratus Punches in Supine with Dumbbell  - 1 x daily - 7 x weekly - 3 sets - 10 reps - Standing Bilateral Low Shoulder Row with Anchored Resistance  - 1 x daily - 7 x weekly - 2 sets - 10 reps - Shoulder extension with resistance - Neutral  - 1 x daily - 7 x weekly - 2 sets - 10 reps - Standing Elbow Extension with Anchored Resistance  - 1 x daily - 7 x weekly - 2 sets - 10 reps  ASSESSMENT:  CLINICAL IMPRESSION:   M[place]ara was able to tolerate more left UE strengthening today.  However, she did become dizzy with fwd bent position.  She had completed all of the tasks in that position so we did sidelying after this.  She takes antivert on a regular basis.  We discussed options for treatment so that she wouldn't have to be on constant meds for this.  We also educated patient on how her vestibular system is affected by her limited cervical mobility.  Also explained to patient that her left shoulder, neck pain and vertigo are all likely  affecting the other.  To eliminate the cycle, she needs to work hard on doing her cervical ROM exercises and be consistent with her left shoulder strengthening.  Patient will continue to benefit from skilled therapy to address current impairments and functional limitations.  OBJECTIVE IMPAIRMENTS: decreased mobility, decreased ROM, decreased strength, hypomobility, increased fascial restrictions, increased muscle spasms, impaired flexibility, impaired UE functional use, postural dysfunction, and pain.   ACTIVITY LIMITATIONS: carrying, lifting, sleeping, transfers, bed mobility, bathing, toileting, and dressing  PARTICIPATION LIMITATIONS: meal prep, cleaning, laundry, driving, shopping, and church  PERSONAL FACTORS: Age, Fitness, Past/current experiences, and 1-2 comorbidities: multiple fusions, OA  are also affecting patient's functional  outcome.   REHAB POTENTIAL: Good  CLINICAL DECISION MAKING: Stable/uncomplicated  EVALUATION COMPLEXITY: Low  GOALS: Goals reviewed with patient? Yes  SHORT TERM GOALS: Target date: 09/02/2022   Patient will be independent with initial HEP  Baseline: Goal status: partially met   2.  Pain report to be no greater than 4/10  Baseline:  Goal status: ongoing  LONG TERM GOALS: Target date: 09/30/2022   Patient to be independent with advanced HEP  Baseline:  Goal status: IN PROGRESS  2.  Patient to report pain no greater than 2/10  Baseline:  Goal status: IN PROGRESS  3.  Patient to be able to sleep through the night  Baseline:  Goal status: IN PROGRESS  4.  Patient will be able to reach overhead into cabinets and on top of shelves without pain  Baseline:  Goal status: IN PROGRESS  5.  Patient to be able to stay involved in her church activities Baseline:  Goal status: IN PROGRESS  6.  C spine ROM to improve to [facility] Baseline:  Goal status: IN PROGRESS  PLAN: PT FREQUENCY: 2x/week  PT DURATION: 8 weeks  PLANNED INTERVENTIONS:  Therapeutic exercises, Therapeutic activity, Neuromuscular re-education, Balance training, Patient/Family education, Self Care, Joint mobilization, Stair training, DME instructions, Aquatic Therapy, Dry Needling, Electrical stimulation, Spinal manipulation, Cryotherapy, Moist heat, Taping, Vasopneumatic device, Traction, Ultrasound, Ionotophoresis 4mg /ml Dexamethasone, Manual therapy, and Re-evaluation  PLAN FOR NEXT SESSION: C spine ROM, postural strengthening, continue to work on Lt shoulder strength.   Victorino Dike B. Chriss Redel, PT 09/27/22 4:17 PM [facility] [address] [place], [place] [address] Phone # [phone] Fax [phone]

## 2022-09-29 ENCOUNTER — Ambulatory Visit: Payer: Medicare PPO

## 2022-09-29 DIAGNOSIS — G8929 Other chronic pain: Secondary | ICD-10-CM

## 2022-09-29 DIAGNOSIS — R293 Abnormal posture: Secondary | ICD-10-CM

## 2022-09-29 DIAGNOSIS — M25612 Stiffness of left shoulder, not elsewhere classified: Secondary | ICD-10-CM

## 2022-09-29 DIAGNOSIS — R252 Cramp and spasm: Secondary | ICD-10-CM

## 2022-09-29 DIAGNOSIS — M6281 Muscle weakness (generalized): Secondary | ICD-10-CM

## 2022-09-29 DIAGNOSIS — M25512 Pain in left shoulder: Secondary | ICD-10-CM | POA: Diagnosis not present

## 2022-09-29 NOTE — Therapy (Addendum)
OUTPATIENT PHYSICAL THERAPY TREATMENT PHYSICAL THERAPY DISCHARGE SUMMARY  Visits from Start of Care: 11  Current functional level related to goals / functional outcomes: See below   Remaining deficits: See below   Education / Equipment: See below   Patient agrees to discharge. Patient goals were met. Patient is being discharged due to meeting the stated rehab goals.    Patient Name: JASZMINE LINHART MRN: [id number] DOB:Nov 24, 1955, 67 y.o., female Today's Date: 09/29/2022  END OF SESSION:  PT End of Session - 09/29/22 1105     Visit Number 11    Number of Visits 16    Date for PT Re-Evaluation 09/30/22    Authorization Type [employer]    Authorization Time Period 16 visits. 08/05/2022-09/30/2022    Authorization - Visit Number 11    PT Start Time 1103    PT Stop Time 1136    PT Time Calculation (min) 33 min    Activity Tolerance Patient limited by pain;Patient tolerated treatment well    Behavior During Therapy [facility] for tasks assessed/performed                 Past Medical History:  Diagnosis Date   AF (paroxysmal atrial fibrillation) (HCC)    Arthritis    BCC (basal cell carcinoma) 03/11/2003   left distal lower leg ankle-tx dr Danella Deis   Biventricular cardiac pacemaker in situ    a. prior CRT-D in 2013 with downgrade to CRT-Pacemaker only in 11/2016.   Breast cancer (HCC) 12/12/2019   Chronic systolic CHF (congestive heart failure) (HCC)    Complication of anesthesia    aspiration   Essential tremor    GERD (gastroesophageal reflux disease)    otc   Hypertension    Hypothyroidism    LBBB (left bundle branch block)    conduction disorder   Migraines    Mild aortic insufficiency    NICM (nonischemic cardiomyopathy) (HCC)    a. 2012: normal coronaries, EF 25-30%. b. improved to 55% 11/2016.   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   in situ- right lower leg-anterior ([id number])   Scoliosis    Past Surgical History:  Procedure Laterality Date   ARTERY BIOPSY  12/29/2011   Procedure: MINOR BIOPSY TEMPORAL ARTERY;  Surgeon: Mariella Saa, MD;  Location: [facility];  Service: General;  Laterality: Right;  Right temporal artery biopsy   BACK SURGERY  12/31/2009   "for flat back syndrome; fused bottom of my back"   BI-VENTRICULAR IMPLANTABLE CARDIOVERTER DEFIBRILLATOR Left 01/20/2011   Procedure: BI-VENTRICULAR IMPLANTABLE CARDIOVERTER DEFIBRILLATOR  (CRT-D);  Surgeon: Marinus Maw, MD;  Location: [facility] CATH LAB;  Service: Cardiovascular;  Laterality: Left;   BIV ICD GENERATOR CHANGEOUT N/A 11/24/2016   Procedure: BIV ICD GENERATOR CHANGEOUT;  Surgeon: Marinus Maw, MD;  Location: [facility] INVASIVE CV LAB;  Service: Cardiovascular;  Laterality: N/A;   BREAST LUMPECTOMY WITH RADIOACTIVE SEED LOCALIZATION Left 01/22/2020   Procedure: LEFT BREAST LUMPECTOMY WITH RADIOACTIVE SEED LOCALIZATION;  Surgeon: Manus Rudd, MD;  Location: [facility];  Service: General;  Laterality: Left;  LMA   CHOLECYSTECTOMY  ~1996   FOOT NEUROMA SURGERY  ~ 2003   right   RE-EXCISION OF BREAST LUMPECTOMY Left 03/03/2020   Procedure: RE-EXCISION  MARGINS OF LEFT BREAST LUMPECTOMY;  Surgeon: Manus Rudd, MD;  Location: [facility];  Service: General;  Laterality: Left;   ROTATOR CUFF REPAIR  ~ 2009   left   SHOULDER ARTHROSCOPY W/ ROTATOR CUFF REPAIR Right 09/21/2011   SKIN CANCER EXCISION     nose, forehead, left leg   TEE WITHOUT CARDIOVERSION N/A 03/01/2017   Procedure: TRANSESOPHAGEAL ECHOCARDIOGRAM (TEE);  Surgeon: Chilton Si, MD;  Location: [facility] ENDOSCOPY;  Service: Cardiovascular;  Laterality: N/A;   THYROID LOBECTOMY  ~ 2006   right   TUBAL LIGATION  ~ 1983   VAGINAL HYSTERECTOMY  ~ 2009    VESICOVAGINAL FISTULA CLOSURE W/ TAH     Patient Active Problem List   Diagnosis Date Noted   Biventricular ICD (implantable cardioverter-defibrillator) in place    DOE (dyspnea on exertion) 04/07/2021   Age-related osteoporosis without current pathological fracture 11/29/2020   Atrophy of vagina 11/29/2020   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   Encounter for counseling 09/17/2020   Breast CA (HCC) 02/18/2020   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   Rosacea 11/05/2019   Scar 11/05/2019   Biventricular cardiac pacemaker in situ 10/08/2018   Long term current use of anticoagulant therapy 10/23/2017   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   Trigeminal neuralgia 12/14/2012   Insomnia, persistent 11/03/2011   AICD (automatic cardioverter/defibrillator) present    Aortic regurgitation    Nonischemic cardiomyopathy (HCC) 01/21/2011   Esophagitis, reflux 08/24/2010   Idiopathic peripheral neuropathy 08/24/2010   Chronic systolic CHF (congestive heart failure) (HCC) 02/24/2010   Paroxysmal atrial fibrillation (HCC) 02/23/2010   Hypothyroidism    History of migraine headaches    Left bundle branch block    Scoliosis     Class: Chronic   Lumbar canal stenosis 12/25/2009   Anxiety state 07/24/2008   Avitaminosis D 03/21/2008   Atypical migraine 01/30/2008   Benign essential HTN 01/30/2008   Paroxysmal digital cyanosis 02/15/2007    PCP: Cammy Copa, MD   REFERRING PROVIDER: Cammy Copa, MD  REFERRING DIAG: [phone] (ICD-10-CM) - Arthritis of left shoulder region  THERAPY DIAG:  Chronic left shoulder pain  Cramp and spasm  Muscle weakness (generalized)  Stiffness of left shoulder, not elsewhere classified  Abnormal posture  Rationale for Evaluation and Treatment: Rehabilitation  ONSET DATE: 06/20/2022  SUBJECTIVE:                                                                                                                                                                                       SUBJECTIVE STATEMENT: Patient reports feeling good today.    No pain.  Discussed DC.  PT explained to patient that we had put in for date extension if she felt like she needed a few more visits but she feels she would like to continue on her own.  "I know what I need to do.  If it resurfaces later, I will talk to Dr. August Saucer about coming back".   Eval: Patient reports on going shoulder and neck issues for several months.  She is unable to use her arms overhead without pain.  She is able to do most of her usual community activities but has felt like cutting back on activities due to her constant pain. She states she lives with her husband.  Son lives out of town.  She has 2 dogs that she walks daily.  I feel like I just don't have a lot of energy.  I'm on new medication that is an anti-depressant that is supposed to help with bone aches and pain.  I help at the church at times.  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  I still fix supper and clean my own house.  She was a Engineer, site.  Has someone come in to help with heavy duty cleaning once every two weeks.  Hx of spinal surgery/ fusion. Hand dominance: Right  PERTINENT HISTORY: Lumbar fusion to pelvic fusion  PAIN:  Are you having pain? Yes, left  and right neck 6/10 and left shoulder cracks   PRECAUTIONS: None  RED FLAGS: None   WEIGHT BEARING RESTRICTIONS: No  FALLS:  Has patient fallen in last 6 months? No  LIVING ENVIRONMENT: Lives with: lives with their spouse Lives in: House/apartment   OCCUPATION: Retired  PLOF: Independent, Independent with basic ADLs, Independent with household mobility without device, Independent with community mobility without device, Independent with homemaking with ambulation, Independent with gait, and Independent with transfers  PATIENT GOALS: to be able to use my arms without pain and for  my neck to stop hurting  NEXT MD VISIT: prn  OBJECTIVE:   DIAGNOSTIC FINDINGS:  Patient not candidate for MRI due to pacemaker  COGNITION: Overall cognitive status: Within functional limits for tasks assessed     SENSATION: WFL  POSTURE: Rounded shoulders/ forward head  UPPER EXTREMITY ROM:   WFL but with painful arc and end range pain  UPPER EXTREMITY MMT:  Eval:  Generally 4/5 left UE with exception of ER which is 4-/5  09/29/22: all 4+ to 5/5 throughout left UE,  minor compensation with elevation using upper trap and levator  SHOULDER SPECIAL TESTS: Impingement tests: Painful arc test: positive    09/29/22: no longer has painful arc    TODAY'S TREATMENT:       DATE: 09/29/22 UBE x 6 min Re-assessment for DC Reviewed HEP and how to progress,  what exercises are most critical, DC plan Manual C spine PROM, upper trap stretch, levator stretch x 8 min  09/27/22 UBE x 6 min 3 way scapular stabilization x 10 with green loop left 4 D ball rolls with light blue plyo ball x 20 each left Fwd bent shoulder ext 2 x 10 with 1 lb left Fwd bent shoulder rows 2 x 10 with 1 lb left Fwd bent shoulder horizontal abduction 2 x 10 with 1 lb left Side lying left shoulder ER 2 x 10 with 1 lb Supine serratus punch with 1 lb 2 x 10 Manual PROM cervical spine, bilateral upper trap STM, suboccipital release x 9 min  09/23/22  Seated chin tucks 3 way (extension, bila side bending) x10 each  Seated Isometric flexion & extension (manual resistance) 5 x 5 sec holds each Shoulder Rolls x10 Seated Isometric rotation (manual resistance) 5 x 5 sec holds each way Seated shoulder extension with red band 2 x 10 Seated shoulder rows with red band 2 x 10 Seated bilateral shoulder ER with yellow band 2 x 10 Seated shoulder abduction with yellow band 2 x 10 Manual: upper trap and levator stretch bilaterally, STM to left and right upper trap, PROM c spine all planes of motion,  sub occipital  release.  PATIENT EDUCATION: Education details: initiated hep Person educated: Patient Education method: Programmer, multimedia, Facilities manager, Verbal cues, and Handouts Education comprehension: verbalized understanding and returned demonstration  HOME EXERCISE PROGRAM: Access Code: [email or web address] URL: [email or web address] Date: 08/11/2022 Prepared by: Lavinia Sharps  Exercises - Standing Cervical Flexion AROM  - 3-4 x daily - 7 x weekly - 1 sets - 5 reps - 5-10 seconds hold - Standing Cervical Rotation AROM  - 3-4 x daily - 7 x weekly - 1 sets - 5 reps - 5-10seconds hold - Standing Cervical Sidebending AROM  - 3-4 x daily - 7 x weekly - 1 sets - 5 reps - 5-10second hold - Standing Cervical Retraction  - 3-4 x daily - 7 x weekly - 1 sets - 5 reps - 5-10second hold - Single Arm Bent Over Shoulder Extension with Dumbbell  - 1 x daily - 7 x weekly - 3 sets - 10 reps - Bent Over Single Arm Shoulder Row with Dumbbell  - 1 x daily - 7 x weekly - 3 sets - 10 reps - Single Arm Bent Over Shoulder Horizontal Abduction with Dumbbell - Palm Down  - 1 x daily - 7 x weekly - 3 sets - 10 reps - Sidelying Shoulder External Rotation Dumbbell  - 1 x daily - 7 x weekly - 3 sets - 10 reps - Single Arm Serratus Punches in Supine with Dumbbell  - 1 x daily - 7 x weekly - 3 sets - 10 reps - Standing Bilateral Low Shoulder Row with Anchored Resistance  - 1 x daily - 7 x weekly - 2 sets - 10 reps - Shoulder extension with resistance - Neutral  - 1 x daily - 7 x weekly - 2 sets - 10 reps - Standing Elbow Extension with Anchored Resistance  - 1 x daily - 7 x weekly - 2 sets - 10 reps  ASSESSMENT:  CLINICAL IMPRESSION:   Shantella had a few visits left but date had run out.  We requested date extension but patient feels she is ready for DC.  She is independent and fairly compliant with her HEP.  We emphasized HEP and importance of doing her left shoulder strengthening.  She met all goals and objective findings are much  improved.  We will DC at this time.    OBJECTIVE IMPAIRMENTS: decreased mobility, decreased ROM, decreased strength, hypomobility, increased fascial restrictions, increased muscle spasms, impaired flexibility, impaired UE functional use, postural dysfunction, and pain.   ACTIVITY LIMITATIONS: carrying, lifting, sleeping, transfers, bed mobility, bathing, toileting, and dressing  PARTICIPATION LIMITATIONS: meal prep, cleaning, laundry, driving, shopping, and church  PERSONAL FACTORS: Age, Fitness, Past/current experiences, and 1-2 comorbidities: multiple fusions, OA  are also affecting patient's functional outcome.   REHAB POTENTIAL: Good  CLINICAL DECISION MAKING: Stable/uncomplicated  EVALUATION COMPLEXITY: Low  GOALS: Goals reviewed with patient? Yes  SHORT TERM GOALS: Target date: 09/02/2022  Patient will be independent with initial HEP  Baseline: Goal status: MET 09/29/22   2.  Pain report to be no greater than 4/10  Baseline:  Goal status: MET 09/29/22  LONG TERM GOALS: Target date: 09/30/2022   Patient to be independent with advanced HEP  Baseline:  Goal status: MET  2.  Patient to report pain no greater than 2/10  Baseline:  Goal status: MET  3.  Patient to be able to sleep through the night  Baseline:  Goal status: MET  4.  Patient will be able to reach overhead into cabinets and on top of shelves without pain  Baseline:  Goal status: MET  5.  Patient to be able to stay involved in her church activities Baseline:  Goal status: MET  6.  C spine ROM to improve to [facility] Baseline: Patient has functional rotation but side bending is still quite limited Goal status: NOT MET/partially met  PLAN: PT FREQUENCY: 2x/week  PT DURATION: 8 weeks  PLANNED INTERVENTIONS: Therapeutic exercises, Therapeutic activity, Neuromuscular re-education, Balance training, Patient/Family education, Self Care, Joint mobilization, Stair training, DME instructions, Aquatic Therapy,  Dry Needling, Electrical stimulation, Spinal manipulation, Cryotherapy, Moist heat, Taping, Vasopneumatic device, Traction, Ultrasound, Ionotophoresis 4mg /ml Dexamethasone, Manual therapy, and Re-evaluation  PLAN FOR NEXT SESSION: We will DC at this time.   Victorino Dike B. Charlot Gouin, PT 09/29/22 12:40 PM [facility] [address], [address] [place], [place] [address] Phone # [phone] Fax [phone]

## 2022-09-30 ENCOUNTER — Other Ambulatory Visit (HOSPITAL_BASED_OUTPATIENT_CLINIC_OR_DEPARTMENT_OTHER): Payer: Self-pay | Admitting: Family

## 2022-09-30 MED ORDER — SPIRONOLACTONE 25 MG PO TABS: 12.5000 mg | ORAL_TABLET | Freq: Every day | ORAL | 0 refills | Status: DC

## 2022-09-30 NOTE — Telephone Encounter (Signed)
Stable kidney function and potassium by labs 08/2022. Has OV with Dr. Ladona Ridgel 11/2022, 90 day refill provided with future refills at that time.   Overdue for follow up with Dr. Cristal Deer, will route to [facility] scheduling.   Alver Sorrow, NP

## 2022-10-10 NOTE — Progress Notes (Signed)
I, Alyson Reedy, hereby attest that the medical record entry for  the [facility] on 09/20/2022 accurately reflects signature/notations that I made  in my capacity as FNP-C when I treated /diagnosed the patient. I do hereby attest that this information is true, accurate and complete to the best of my knowledge and I understand that any falsification, omission, or concealment of material fact may subject me to administrative, civil, or criminal liability.

## 2022-10-12 ENCOUNTER — Encounter (HOSPITAL_BASED_OUTPATIENT_CLINIC_OR_DEPARTMENT_OTHER): Payer: Self-pay | Admitting: Family Medicine

## 2022-10-18 ENCOUNTER — Other Ambulatory Visit (HOSPITAL_BASED_OUTPATIENT_CLINIC_OR_DEPARTMENT_OTHER): Payer: Self-pay | Admitting: Family Medicine

## 2022-10-18 ENCOUNTER — Other Ambulatory Visit (HOSPITAL_BASED_OUTPATIENT_CLINIC_OR_DEPARTMENT_OTHER): Payer: Self-pay

## 2022-10-18 DIAGNOSIS — R200 Anesthesia of skin: Secondary | ICD-10-CM

## 2022-10-18 DIAGNOSIS — G43009 Migraine without aura, not intractable, without status migrainosus: Secondary | ICD-10-CM

## 2022-11-02 ENCOUNTER — Other Ambulatory Visit (HOSPITAL_BASED_OUTPATIENT_CLINIC_OR_DEPARTMENT_OTHER): Payer: Self-pay

## 2022-11-16 ENCOUNTER — Ambulatory Visit: Payer: Medicare PPO | Attending: Internal Medicine | Admitting: Internal Medicine

## 2022-11-16 ENCOUNTER — Encounter: Payer: Self-pay | Admitting: Internal Medicine

## 2022-11-16 VITALS — BP 110/60 | HR 54 | Ht 67.5 in

## 2022-11-16 DIAGNOSIS — Z95810 Presence of automatic (implantable) cardiac defibrillator: Secondary | ICD-10-CM

## 2022-11-16 NOTE — Progress Notes (Signed)
HPI Mrs. Platas returns today for followup. She is a pleasant 67 yo woman with a h/o non-ischemic CM, LBBB, s/p BIV PPM insertion. She had her device last placed under the left pectoralis muscle. She has been diagnosed with breast CA and undergone left breast tumor excision by Dr. Corliss Skains. She has completed XRT/chemo. She has otherwise done well. Her energy level is down and she does not have as much energy but otherwise well. She admits to being sedentary. Allergies  Allergen Reactions   Other Nausea And Vomiting and Other (See Comments)    Most narcotic pain meds cause N/V and CHEST PAIN; needs an antiemetic to tolerate  Also, patient was diagnosed with Raynaud's disease and was told to NOT place IV(s) in either hand because of poor circulation   Iodine Other (See Comments)    "Blisters"   Tape Other (See Comments)    PATIENT'S SKIN IS VERY THIN; IT TEARS, BRUISES, AND BLEEDS VERY EASILY (PLEASE USE COBAN WRAP, IF POSSIBLE!!)   Codeine Other (See Comments)    Chest pain   Fentanyl Other (See Comments)    Bad dreams   Lexapro [Escitalopram] Rash   Morphine And Codeine Nausea And Vomiting     Current Outpatient Medications  Medication Sig Dispense Refill   acetaminophen (TYLENOL) 325 MG tablet Take 650 mg by mouth as needed.     ALPRAZolam (XANAX) 0.5 MG tablet Take 0.5 mg by mouth 3 (three) times daily as needed for anxiety.     anastrozole (ARIMIDEX) 1 MG tablet TAKE 1 TABLET BY MOUTH EVERY DAY 90 tablet 2   Atogepant (QULIPTA) 60 MG TABS Take 1 tablet (60 mg total) by mouth daily for 90 doses. 30 tablet 2   Azelaic Acid 15 % gel Apply 1 Application topically 2 (two) times daily.     CALCIUM PO Take 2 tablets by mouth daily.     cetirizine (ZYRTEC) 10 MG tablet Take 10 mg by mouth daily.     Cyanocobalamin 1000 MCG/ML KIT Inject 1,000 mcg every 30 (thirty) days as directed.     doxycycline (VIBRAMYCIN) 50 MG capsule Take 50 mg by mouth daily.     DULoxetine (CYMBALTA) 60 MG  capsule Take 1 capsule (60 mg total) by mouth daily. 90 capsule 0   eletriptan (RELPAX) 40 MG tablet Take 1 tablet (40 mg total) by mouth daily as needed for migraine. May repeat in 2 hours if necessary 10 tablet 2   ELIQUIS 5 MG TABS tablet TAKE 1 TABLET BY MOUTH TWICE A DAY 180 tablet 1   famotidine (PEPCID) 20 MG tablet Take 20 mg by mouth daily as needed for heartburn or indigestion.     gabapentin (NEURONTIN) 600 MG tablet Take 600 mg by mouth 2 (two) times daily.     ketoconazole (NIZORAL) 2 % cream Apply to corner of mouth nightly 15 g 0   levothyroxine (SYNTHROID, LEVOTHROID) 100 MCG tablet Take 100 mcg daily before breakfast by mouth.     Melatonin 5 MG CAPS Take 5 mg by mouth at bedtime.     Menthol (BIOFREEZE) 10 % AERO      Omega-3 Fatty Acids (FISH OIL) 1000 MG CAPS 1 capsule     omeprazole (PRILOSEC) 20 MG capsule Take 1 capsule (20 mg total) by mouth daily. 90 capsule 0   polyethylene glycol (MIRALAX / GLYCOLAX) packet Take 17 g by mouth every evening.     propranolol (INDERAL) 60 MG tablet Take 1  tablet (60 mg total) by mouth daily. 90 tablet 3   Varenicline Tartrate (TYRVAYA) 0.03 MG/ACT SOLN Place into the nose.     VITAMIN D PO Take 2 capsules by mouth daily.     spironolactone (ALDACTONE) 25 MG tablet Take 0.5 tablets (12.5 mg total) by mouth daily. 45 tablet 0   No current facility-administered medications for this visit.     Past Medical History:  Diagnosis Date   AF (paroxysmal atrial fibrillation) (HCC)    Arthritis    BCC (basal cell carcinoma) 03/11/2003   left distal lower leg ankle-tx dr Danella Deis   Biventricular cardiac pacemaker in situ    a. prior CRT-D in 2013 with downgrade to CRT-Pacemaker only in 11/2016.   Breast cancer (HCC) 12/12/2019   Chronic systolic CHF (congestive heart failure) (HCC)    Complication of anesthesia    aspiration   Essential tremor    GERD (gastroesophageal reflux disease)    otc   Hypertension    Hypothyroidism    LBBB  (left bundle branch block)    conduction disorder   Migraines    Mild aortic insufficiency    NICM (nonischemic cardiomyopathy) (HCC)    a. 2012: normal coronaries, EF 25-30%. b. improved to 55% 11/2016.   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   in situ- right lower leg-anterior (CX35FU)   Scoliosis     ROS:   All systems reviewed and negative except as noted in the HPI.   Past Surgical History:  Procedure Laterality Date   ARTERY BIOPSY  12/29/2011   Procedure: MINOR BIOPSY TEMPORAL ARTERY;  Surgeon: Mariella Saa, MD;  Location: [facility];  Service: General;  Laterality: Right;  Right temporal artery biopsy   BACK SURGERY  12/31/2009   "for flat back syndrome; fused bottom of my back"   BI-VENTRICULAR IMPLANTABLE CARDIOVERTER DEFIBRILLATOR Left 01/20/2011   Procedure: BI-VENTRICULAR IMPLANTABLE CARDIOVERTER DEFIBRILLATOR  (CRT-D);  Surgeon: Marinus Maw, MD;  Location: [facility] CATH LAB;  Service: Cardiovascular;  Laterality: Left;   BIV ICD GENERATOR CHANGEOUT N/A 11/24/2016   Procedure: BIV ICD GENERATOR CHANGEOUT;  Surgeon: Marinus Maw, MD;  Location: [facility] INVASIVE CV LAB;  Service: Cardiovascular;  Laterality: N/A;   BREAST LUMPECTOMY WITH RADIOACTIVE SEED LOCALIZATION Left 01/22/2020   Procedure: LEFT BREAST LUMPECTOMY WITH RADIOACTIVE SEED LOCALIZATION;  Surgeon: Manus Rudd, MD;  Location: [facility];  Service: General;  Laterality: Left;  LMA   CHOLECYSTECTOMY  ~1996   FOOT NEUROMA SURGERY  ~ 2003   right   RE-EXCISION OF BREAST LUMPECTOMY Left 03/03/2020   Procedure: RE-EXCISION MARGINS OF LEFT BREAST LUMPECTOMY;  Surgeon: Manus Rudd, MD;  Location: [facility];  Service: General;  Laterality: Left;    ROTATOR CUFF REPAIR  ~ 2009   left   SHOULDER ARTHROSCOPY W/ ROTATOR CUFF REPAIR Right 09/21/2011   SKIN CANCER EXCISION     nose, forehead, left leg   TEE WITHOUT CARDIOVERSION N/A 03/01/2017   Procedure: TRANSESOPHAGEAL ECHOCARDIOGRAM (TEE);  Surgeon: Chilton Si, MD;  Location: [facility]OSCOPY;  Service: Cardiovascular;  Laterality: N/A;   THYROID LOBECTOMY  ~ 2006   right   TUBAL LIGATION  ~ 1983   VAGINAL HYSTERECTOMY  ~ 2009  VESICOVAGINAL FISTULA CLOSURE W/ TAH       Family History  Problem Relation Age of Onset   Heart disease Mother    Heart failure Mother 62       CHF   Stroke Mother    Hypertension Mother    Heart disease Father 32   Macular degeneration Father    Heart disease Sister    Atrial fibrillation Sister    Heart disease Sister      Social History   Socioeconomic History   Marital status: Married    Spouse name: Kathlene November   Number of children: 1   Years of education: MA   Highest education level: Master's degree (e.g., MA, MS, MEng, MEd, MSW, MBA)  Occupational History   Occupation: Teacher    Comment: n/a  Tobacco Use   Smoking status: Never    Passive exposure: Never   Smokeless tobacco: Never  Vaping Use   Vaping status: Never Used  Substance and Sexual Activity   Alcohol use: No   Drug use: No   Sexual activity: Yes  Other Topics Concern   Not on file  Social History Narrative   Patient lives at home with family.   Caffeine Use: 1-2 cups daily   Social Determinants of Health   Financial Resource Strain: Low Risk  (09/20/2022)   Overall Financial Resource Strain (CARDIA)    Difficulty of Paying Living Expenses: Not hard at all  Food Insecurity: No Food Insecurity (09/20/2022)   Hunger Vital Sign    Worried About Running Out of Food in the Last Year: Never true    Ran Out of Food in the Last Year: Never true  Transportation Needs: No Transportation Needs (09/20/2022)   PRAPARE - Administrator, Civil Service (Medical):  No    Lack of Transportation (Non-Medical): No  Physical Activity: Insufficiently Active (09/20/2022)   Exercise Vital Sign    Days of Exercise per Week: 4 days    Minutes of Exercise per Session: 30 min  Stress: No Stress Concern Present (09/20/2022)   [employer] of Occupational Health - Occupational Stress Questionnaire    Feeling of Stress : Not at all  Recent Concern: Stress - Stress Concern Present (08/16/2022)   [employer] of Occupational Health - Occupational Stress Questionnaire    Feeling of Stress : To some extent  Social Connections: Socially Integrated (09/20/2022)   Social Connection and Isolation Panel [NHANES]    Frequency of Communication with Friends and Family: More than three times a week    Frequency of Social Gatherings with Friends and Family: More than three times a week    Attends Religious Services: More than 4 times per year    Active Member of [employer] or Organizations: Yes    Attends Engineer, structural: More than 4 times per year    Marital Status: Married  Catering manager Violence: Not At Risk (09/20/2022)   Humiliation, Afraid, Rape, and Kick questionnaire    Fear of Current or Ex-Partner: No    Emotionally Abused: No    Physically Abused: No    Sexually Abused: No     BP 110/60   Pulse (!) 54   Ht 5' 7.5" (1.715 m)   SpO2 98%   BMI 25.00 kg/m   Physical Exam:  Well appearing NAD HEENT: Unremarkable Neck:  No JVD, no thyromegally Lymphatics:  No adenopathy Back:  No CVA tenderness Lungs:  Clear with no wheezes HEART:  Regular rate rhythm,  no murmurs, no rubs, no clicks Abd:  soft, positive bowel sounds, no organomegally, no rebound, no guarding Ext:  2 plus pulses, no edema, no cyanosis, no clubbing Skin:  No rashes no nodules Neuro:  CN II through XII intact, motor grossly intact  EKG - nsr with biv pacing  DEVICE  Normal device function.  See PaceArt for details.   Assess/Plan:  1. Chronic systolic heart failure  - her symptoms are well compensated class 2A. She will continue her current meds. 2. Biv PPM - her device has just under 4 years of longevity. We will recheck. 3. Breast CA - she is s/p chemo/XRT 4. PAF - she is maintaining NSR very nicely. She will continue her systemic anti-coagulation.   Sharlot Gowda Mehtaab Mayeda,MD

## 2022-11-16 NOTE — Patient Instructions (Signed)

## 2022-11-17 ENCOUNTER — Other Ambulatory Visit: Payer: Self-pay | Admitting: *Deleted

## 2022-11-17 MED ORDER — ANASTROZOLE 1 MG PO TABS: 1.0000 mg | ORAL_TABLET | Freq: Every day | ORAL | 0 refills | Status: DC

## 2022-11-17 NOTE — Telephone Encounter (Signed)
LVM request refill on anastrozole and need to send to [facility] (new pharmacy).

## 2022-11-22 ENCOUNTER — Ambulatory Visit (INDEPENDENT_AMBULATORY_CARE_PROVIDER_SITE_OTHER): Payer: Medicare PPO

## 2022-11-22 DIAGNOSIS — I428 Other cardiomyopathies: Secondary | ICD-10-CM | POA: Diagnosis not present

## 2022-11-22 DIAGNOSIS — I5022 Chronic systolic (congestive) heart failure: Secondary | ICD-10-CM

## 2022-11-23 ENCOUNTER — Telehealth (HOSPITAL_BASED_OUTPATIENT_CLINIC_OR_DEPARTMENT_OTHER): Payer: Self-pay | Admitting: Family Medicine

## 2022-11-23 ENCOUNTER — Telehealth: Payer: Self-pay | Admitting: Cardiology

## 2022-11-23 DIAGNOSIS — I480 Paroxysmal atrial fibrillation: Secondary | ICD-10-CM

## 2022-11-23 NOTE — Telephone Encounter (Signed)
*  STAT* If patient is at the pharmacy, call can be transferred to refill team.   1. Which medications need to be refilled? (please list name of each medication and dose if known) ELIQUIS 5 MG TABS tablet    2. Would you like to learn more about the convenience, safety, & potential cost savings by using the [facility]? No      3. Are you open to using the [facility] (Type [facility]. No ).   4. Which pharmacy/location (including street and city if local pharmacy) is medication to be sent to? [facility] [place], [place] - [address]    5. Do they need a 30 day or 90 day supply? 90

## 2022-11-23 NOTE — Telephone Encounter (Signed)
 Eliquis refill

## 2022-11-23 NOTE — Telephone Encounter (Signed)
    LOV: Visit date not found   What is the name of the medication or equipment? Duloxetine 60mg  capsule and Omeprazole 40mg    Have you contacted your pharmacy to request a refill? Yes   Which pharmacy would you like this sent to?  [facility]   Patient notified that their request is being sent to the clinical staff for review and that they should receive a response within 2 business days.   Please advise at [facility] [phone]

## 2022-11-24 ENCOUNTER — Other Ambulatory Visit (HOSPITAL_BASED_OUTPATIENT_CLINIC_OR_DEPARTMENT_OTHER): Payer: Self-pay

## 2022-11-24 LAB — CUP PACEART REMOTE DEVICE CHECK
Battery Remaining Longevity: 46 mo
Battery Voltage: 2.94 V
Brady Statistic AP VP Percent: 5.94 %
Brady Statistic AP VS Percent: 0.15 %
Brady Statistic AS VP Percent: 92.11 %
Brady Statistic AS VS Percent: 1.81 %
Brady Statistic RA Percent Paced: 6.09 %
Brady Statistic RV Percent Paced: 1.61 %
Date Time Interrogation Session: 20241111193328
Implantable Lead Connection Status: 753985
Implantable Lead Connection Status: 753985
Implantable Lead Connection Status: 753985
Implantable Lead Implant Date: 20130110
Implantable Lead Implant Date: 20130110
Implantable Lead Implant Date: 20130110
Implantable Lead Location: 753858
Implantable Lead Location: 753859
Implantable Lead Location: 753860
Implantable Lead Model: 4196
Implantable Lead Model: 5076
Implantable Lead Model: 6935
Implantable Pulse Generator Implant Date: 20181115
Lead Channel Impedance Value: 1007 Ohm
Lead Channel Impedance Value: 1292 Ohm
Lead Channel Impedance Value: 304 Ohm
Lead Channel Impedance Value: 361 Ohm
Lead Channel Impedance Value: 608 Ohm
Lead Channel Impedance Value: 760 Ohm
Lead Channel Impedance Value: 798 Ohm
Lead Channel Impedance Value: 912 Ohm
Lead Channel Impedance Value: 988 Ohm
Lead Channel Pacing Threshold Amplitude: 0.625 V
Lead Channel Pacing Threshold Amplitude: 1.5 V
Lead Channel Pacing Threshold Amplitude: 2.5 V
Lead Channel Pacing Threshold Pulse Width: 0.4 ms
Lead Channel Pacing Threshold Pulse Width: 0.4 ms
Lead Channel Pacing Threshold Pulse Width: 0.8 ms
Lead Channel Sensing Intrinsic Amplitude: 0.875 mV
Lead Channel Sensing Intrinsic Amplitude: 0.875 mV
Lead Channel Sensing Intrinsic Amplitude: 18.75 mV
Lead Channel Sensing Intrinsic Amplitude: 18.75 mV
Lead Channel Setting Pacing Amplitude: 2 V
Lead Channel Setting Pacing Amplitude: 2.5 V
Lead Channel Setting Pacing Amplitude: 3.5 V
Lead Channel Setting Pacing Pulse Width: 0.8 ms
Lead Channel Setting Pacing Pulse Width: 1 ms
Lead Channel Setting Sensing Sensitivity: 0.9 mV
Zone Setting Status: 755011
Zone Setting Status: 755011

## 2022-11-24 MED ORDER — APIXABAN 5 MG PO TABS
5.0000 mg | ORAL_TABLET | Freq: Two times a day (BID) | ORAL | 1 refills | Status: DC
  Filled 2022-11-24: qty 180, 90d supply, fill #0

## 2022-11-24 MED ORDER — OMEPRAZOLE 40 MG PO CPDR: 40.0000 mg | DELAYED_RELEASE_CAPSULE | Freq: Every day | ORAL | 0 refills | Status: DC

## 2022-11-24 MED ORDER — DULOXETINE HCL 60 MG PO CPEP: 60.0000 mg | ORAL_CAPSULE | Freq: Every day | ORAL | 0 refills | Status: DC

## 2022-11-24 NOTE — Telephone Encounter (Signed)
Prescription refill request for Eliquis received. Indication: a fib Last office visit: 11/16/22 Scr: 0.76 09/01/22 epic Age: 67 Weight: 74kg

## 2022-11-24 NOTE — Telephone Encounter (Signed)
Spoke with patient, medications sent to [facility]

## 2022-11-28 ENCOUNTER — Other Ambulatory Visit (HOSPITAL_BASED_OUTPATIENT_CLINIC_OR_DEPARTMENT_OTHER): Payer: Self-pay

## 2022-12-01 ENCOUNTER — Encounter: Payer: Self-pay | Admitting: Internal Medicine

## 2022-12-01 ENCOUNTER — Encounter (HOSPITAL_BASED_OUTPATIENT_CLINIC_OR_DEPARTMENT_OTHER): Payer: Self-pay | Admitting: Family Medicine

## 2022-12-01 ENCOUNTER — Other Ambulatory Visit (HOSPITAL_BASED_OUTPATIENT_CLINIC_OR_DEPARTMENT_OTHER): Payer: Self-pay | Admitting: Family Medicine

## 2022-12-05 ENCOUNTER — Other Ambulatory Visit (HOSPITAL_BASED_OUTPATIENT_CLINIC_OR_DEPARTMENT_OTHER): Payer: Self-pay | Admitting: Family Medicine

## 2022-12-05 ENCOUNTER — Ambulatory Visit (HOSPITAL_BASED_OUTPATIENT_CLINIC_OR_DEPARTMENT_OTHER): Payer: Medicare PPO | Admitting: Family Medicine

## 2022-12-05 ENCOUNTER — Other Ambulatory Visit: Payer: Self-pay

## 2022-12-05 VITALS — BP 119/49 | HR 50 | Ht 67.5 in | Wt 162.2 lb

## 2022-12-05 DIAGNOSIS — Z23 Encounter for immunization: Secondary | ICD-10-CM

## 2022-12-05 DIAGNOSIS — F330 Major depressive disorder, recurrent, mild: Secondary | ICD-10-CM

## 2022-12-05 DIAGNOSIS — E039 Hypothyroidism, unspecified: Secondary | ICD-10-CM | POA: Diagnosis not present

## 2022-12-05 DIAGNOSIS — I5022 Chronic systolic (congestive) heart failure: Secondary | ICD-10-CM

## 2022-12-05 DIAGNOSIS — G43709 Chronic migraine without aura, not intractable, without status migrainosus: Secondary | ICD-10-CM | POA: Diagnosis not present

## 2022-12-05 MED ORDER — DULOXETINE HCL 40 MG PO CPEP: 40.0000 mg | ORAL_CAPSULE | Freq: Every day | ORAL | 2 refills | Status: DC

## 2022-12-05 MED ORDER — ELETRIPTAN HYDROBROMIDE 40 MG PO TABS
40.0000 mg | ORAL_TABLET | ORAL | 2 refills | Status: DC | PRN
Start: 2022-12-05 — End: 2023-09-12

## 2022-12-05 MED ORDER — SPIRONOLACTONE 25 MG PO TABS: 12.5000 mg | ORAL_TABLET | Freq: Every day | ORAL | 3 refills | Status: DC

## 2022-12-05 MED ORDER — BUPROPION HCL ER (XL) 150 MG PO TB24: 150.0000 mg | ORAL_TABLET | Freq: Every day | ORAL | 0 refills | Status: DC

## 2022-12-05 NOTE — Patient Instructions (Addendum)
Please let me know if you have any abnormal side effects with starting the Wellbutrin (take in the morning)- including:  Signs of an allergic reaction, like rash; hives; itching; red, swollen, blistered, or peeling skin with or without fever; wheezing; tightness in the chest or throat; trouble breathing, swallowing, or talking; unusual hoarseness; or swelling of the mouth, face, lips, tongue, or throat.  Signs of high blood pressure like very bad headache or dizziness, passing out, or change in eyesight.  Feeling confused, not able to focus, or change in behavior.  Hallucinations (seeing or hearing things that are not there).  If seizures are new or worse after starting this drug.  Chest pain or pressure, a fast heartbeat, or an abnormal heartbeat.  Swelling.  Shortness of breath.  Change in hearing.  Ringing in ears.  Passing urine more often.  Swollen gland.  Trouble moving around.  Some people may have a higher chance of eye problems with this drug. Your doctor may want you to have an eye exam to see if you have a higher chance of these eye problems. Call your doctor right away if you have eye pain, change in eyesight, or swelling or redness in or around the eye.

## 2022-12-05 NOTE — Progress Notes (Unsigned)
Established Patient Office Visit  Subjective   Patient ID: [name], female    DOB: Apr 18, 1955  Age: 67 y.o. MRN: [id number]  [name] is a 67 year old female patient who presents today for concerns regarding her migraines and mood. She reports she had a migraine at least 4 times last week. She has a scheduled upcoming appt with neurology in Dec to start Botox injections for migraines. She will often try Tylenol first, sometimes it helps sometimes it doesn't.   She reports an increase in fatigue and increase in worry. She reports she feels as if she doesn't have a lot of energy. Patient denies SI/HI. Denies needing to use her Xanax frequently. She reports that she rarely needs to use this medication. She denies lack of interest in her hobbies, she has been attending her bible study.      12/05/2022    1:47 PM 07/20/2022    3:09 PM  GAD 7 : Generalized Anxiety Score  Nervous, Anxious, on Edge 3 0  Control/stop worrying 2 1  Worry too much - different things 2 1  Trouble relaxing 1 0  Restless 0 0  Easily annoyed or irritable 1 0  Afraid - awful might happen 0 0  Total GAD 7 Score 9 2  Anxiety Difficulty Very difficult Not difficult at all      12/05/2022    1:47 PM 09/20/2022    1:51 PM 07/20/2022    3:09 PM  PHQ9 SCORE ONLY  PHQ-9 Total Score 15 0 7   Review of Systems  Constitutional:  Negative for malaise/fatigue.  Eyes:  Negative for blurred vision and double vision.  Respiratory:  Negative for cough and shortness of breath.   Cardiovascular:  Positive for palpitations. Negative for chest pain and leg swelling.  Gastrointestinal:  Negative for abdominal pain, nausea and vomiting.  Genitourinary:  Negative for frequency, hematuria and urgency.  Musculoskeletal:  Negative for myalgias.  Neurological:  Negative for dizziness, weakness and headaches.  Psychiatric/Behavioral:  Positive for depression. Negative for hallucinations, memory loss, substance abuse and  suicidal ideas. The patient is nervous/anxious and has insomnia.       Objective:     BP (!) 119/49   Pulse (!) 50   Ht 5' 7.5" (1.715 m)   Wt 162 lb 3.2 oz (73.6 kg)   SpO2 100%   BMI 25.03 kg/m  BP Readings from Last 3 Encounters:  12/05/22 (!) 119/49  11/16/22 110/60  09/01/22 (!) 125/58     Physical Exam Vitals reviewed.  Constitutional:      Appearance: Normal appearance.  Cardiovascular:     Rate and Rhythm: Normal rate and regular rhythm.     Pulses: Normal pulses.     Heart sounds: Normal heart sounds.  Pulmonary:     Effort: Pulmonary effort is normal.     Breath sounds: Normal breath sounds.  Neurological:     Mental Status: She is alert.  Psychiatric:        Mood and Affect: Mood normal.        Behavior: Behavior normal.       Assessment & Plan:    1. Chronic migraine without aura without status migrainosus, not intractable Patient is a pleasant 67 year old female patient who presents for concerns regarding her migraines. She reports having 4 migraines this past week. She is currently - eletriptan (RELPAX) 40 MG tablet; Take 1 tablet (40 mg total) by mouth every 2 (two) hours as needed for migraine  or headache. May repeat in 2 hours if headache persists or recurs.  Dispense: 10 tablet; Refill: 2  2. Mild recurrent major depression (HCC) *** - DULoxetine 40 MG CPEP; Take 1 capsule (40 mg total) by mouth daily.  Dispense: 30 capsule; Refill: 2 - buPROPion (WELLBUTRIN XL) 150 MG 24 hr tablet; Take 1 tablet (150 mg total) by mouth daily.  Dispense: 30 tablet; Refill: 0  3. Hypothyroidism, unspecified type *** - Basic Metabolic Panel (BMET) - TSH Rfx on Abnormal to Free T4  4. Chronic systolic CHF (congestive heart failure) (HCC) Vital signs reviewed. Patient does have a history of CHF. Denies shortness of breath, lower extremity edema, chest pain. She does reports feeling palpitations/tachycardia at night. She is concerned about her recent   5.  Encounter for immunization Patient is agreeable to receive her influenza immunization today.  - Flu Vaccine Trivalent High Dose (Fluad)   Return in about 5 weeks (around 01/09/2023) for Mood f/u.    Alyson Reedy, FNP

## 2022-12-06 LAB — BASIC METABOLIC PANEL
BUN/Creatinine Ratio: 23 (ref 12–28)
BUN: 22 mg/dL (ref 8–27)
CO2: 26 mmol/L (ref 20–29)
Calcium: 9.2 mg/dL (ref 8.7–10.3)
Chloride: 105 mmol/L (ref 96–106)
Creatinine, Ser: 0.97 mg/dL (ref 0.57–1.00)
Glucose: 77 mg/dL (ref 70–99)
Potassium: 4.4 mmol/L (ref 3.5–5.2)
Sodium: 144 mmol/L (ref 134–144)
eGFR: 63 mL/min/{1.73_m2} (ref >59)

## 2022-12-06 LAB — TSH RFX ON ABNORMAL TO FREE T4: TSH: 1.36 u[IU]/mL (ref 0.450–4.500)

## 2022-12-13 DIAGNOSIS — H2513 Age-related nuclear cataract, bilateral: Secondary | ICD-10-CM | POA: Diagnosis not present

## 2022-12-13 DIAGNOSIS — H524 Presbyopia: Secondary | ICD-10-CM | POA: Diagnosis not present

## 2022-12-15 DIAGNOSIS — M7912 Myalgia of auxiliary muscles, head and neck: Secondary | ICD-10-CM | POA: Diagnosis not present

## 2022-12-15 DIAGNOSIS — G43711 Chronic migraine without aura, intractable, with status migrainosus: Secondary | ICD-10-CM | POA: Diagnosis not present

## 2022-12-15 DIAGNOSIS — G2581 Restless legs syndrome: Secondary | ICD-10-CM | POA: Diagnosis not present

## 2022-12-15 DIAGNOSIS — R251 Tremor, unspecified: Secondary | ICD-10-CM | POA: Diagnosis not present

## 2022-12-15 DIAGNOSIS — M7918 Myalgia, other site: Secondary | ICD-10-CM | POA: Diagnosis not present

## 2022-12-15 DIAGNOSIS — F419 Anxiety disorder, unspecified: Secondary | ICD-10-CM | POA: Diagnosis not present

## 2022-12-20 NOTE — Progress Notes (Signed)
Remote pacemaker transmission.   

## 2022-12-26 DIAGNOSIS — Z08 Encounter for follow-up examination after completed treatment for malignant neoplasm: Secondary | ICD-10-CM | POA: Diagnosis not present

## 2022-12-26 DIAGNOSIS — Z853 Personal history of malignant neoplasm of breast: Secondary | ICD-10-CM | POA: Diagnosis not present

## 2022-12-27 ENCOUNTER — Ambulatory Visit (HOSPITAL_BASED_OUTPATIENT_CLINIC_OR_DEPARTMENT_OTHER): Payer: Medicare PPO | Admitting: Family Medicine

## 2022-12-27 ENCOUNTER — Encounter (HOSPITAL_BASED_OUTPATIENT_CLINIC_OR_DEPARTMENT_OTHER): Payer: Self-pay | Admitting: Family Medicine

## 2022-12-27 ENCOUNTER — Other Ambulatory Visit (HOSPITAL_BASED_OUTPATIENT_CLINIC_OR_DEPARTMENT_OTHER): Payer: Self-pay

## 2022-12-27 VITALS — BP 102/80 | HR 51 | Temp 97.5°F | Ht 67.5 in | Wt 166.0 lb

## 2022-12-27 DIAGNOSIS — J209 Acute bronchitis, unspecified: Secondary | ICD-10-CM | POA: Diagnosis not present

## 2022-12-27 DIAGNOSIS — Z1283 Encounter for screening for malignant neoplasm of skin: Secondary | ICD-10-CM

## 2022-12-27 DIAGNOSIS — R051 Acute cough: Secondary | ICD-10-CM | POA: Diagnosis not present

## 2022-12-27 MED ORDER — ALBUTEROL SULFATE HFA 108 (90 BASE) MCG/ACT IN AERS: 2.0000 | INHALATION_SPRAY | Freq: Four times a day (QID) | RESPIRATORY_TRACT | 2 refills | Status: DC | PRN

## 2022-12-27 MED ORDER — BENZONATATE 100 MG PO CAPS: 100.0000 mg | ORAL_CAPSULE | Freq: Three times a day (TID) | ORAL | 0 refills | Status: DC | PRN

## 2022-12-27 MED ORDER — PROMETHAZINE-DM 6.25-15 MG/5ML PO SYRP: 5.0000 mL | ORAL_SOLUTION | Freq: Four times a day (QID) | ORAL | 0 refills | Status: DC | PRN

## 2022-12-27 NOTE — Patient Instructions (Signed)
Vitamin Regimen:  Vitamin C 500mg  twice daily  Vitamin D 5000 units once daily  Zinc 50-75mg  once daily   Over the counter Medications:  Aspirin 81mg  per day * unless allergic or contraindicated*  Use Tylenol (acetaminophen) and/or ibuprofen (Advil) for fever *unless allergic or contraindicated*   Non-Medication Therapy:  Drink plenty of fluids, warm if possible.  A teaspoon of honey may help ease coughing symptoms.  Cough drops or hard candy for coughing.   Over the Counter Medication Therapy:  Use a cough expectorant such as guaifenesin (Mucinex) if recommended by your doctor for a wet, congested cough. If you have high blood pressure, please ask your doctor first before using this.  Use a cough suppressant such as dextromethorphan (Robitussin/Delsym) for a dry cough. If you have high blood pressure, please ask your doctor first before using this. I have sent this to the pharmacy for you.  Use albuterol inhaler if you hear wheezing.

## 2022-12-27 NOTE — Progress Notes (Signed)
Acute Office Visit  Subjective:    Patient ID: [name], female    DOB: 1955-02-12, 67 y.o.   MRN: [id number]  Chief Complaint  Patient presents with   Wheezing    Ongoing for about a week   Cough    Dry cough   Nasal Congestion    Has only tried vicks vapor rub   [name] is a 67 year old female patient who presents today for acute concerns- including wheezing, cough, and nasal congestion x1-2 weeks. She reports the coughing worsens at night and is non-productive. She reports she wheezes more often at night, "sounds like a whistling when I lay down." Reports nasal congestion and ear pain a few days ago. Reports she is frequently blowing her nose throughout the day. Denies fever/chills, sinus pain/pressure, chest pain, palpitations, shob, & lower extremity edema.   Review of Systems  Constitutional:  Negative for chills and fever.  HENT:  Positive for congestion (mostly at night) and ear pain. Negative for sinus pain and sore throat.   Respiratory:  Positive for cough and wheezing. Negative for sputum production and shortness of breath.   Cardiovascular:  Negative for chest pain and palpitations.  Gastrointestinal:  Negative for nausea and vomiting.  Neurological:  Negative for dizziness, speech change, focal weakness and headaches.      Objective:    BP 102/80   Pulse (!) 51   Temp (!) 97.5 F (36.4 C) (Oral)   Ht 5' 7.5" (1.715 m)   Wt 166 lb (75.3 kg)   SpO2 99%   BMI 25.62 kg/m   Physical Exam Vitals reviewed.  Constitutional:      Appearance: Normal appearance.  HENT:     Head: Normocephalic.     Right Ear: Tympanic membrane, ear canal and external ear normal.     Left Ear: Tympanic membrane, ear canal and external ear normal.     Nose: Congestion present. No rhinorrhea.     Right Turbinates: Not enlarged or swollen.     Left Turbinates: Not enlarged or swollen.     Right Sinus: No maxillary sinus tenderness or frontal sinus tenderness.     Left  Sinus: No maxillary sinus tenderness or frontal sinus tenderness.     Mouth/Throat:     Lips: Pink.     Mouth: Mucous membranes are dry.     Tongue: No lesions. Tongue does not deviate from midline.     Palate: No mass and lesions.     Pharynx: Oropharynx is clear.     Tonsils: No tonsillar exudate or tonsillar abscesses.  Cardiovascular:     Rate and Rhythm: Regular rhythm. Bradycardia present.     Pulses: Normal pulses.     Heart sounds: Normal heart sounds.  Pulmonary:     Effort: Pulmonary effort is normal.     Breath sounds: Normal breath sounds.     Comments: Expiratory wheeze noted with forceful cough  Neurological:     Mental Status: She is alert.  Psychiatric:        Mood and Affect: Mood normal.        Behavior: Behavior normal.    Assessment & Plan:   1. Acute bronchitis with wheezing (Primary) Patient is a pleasant 67 year old female patient who presents for cough x1-2 weeks, nasal congestion, and slight wheezing. Patient is well-appearing and in no acute distress. No visible difficulty breathing or audible wheezing. With forced expiration, audible wheezing present. Lungs sounds clear to auscultation bilaterally with no  stridor or wheezing present. No crackles or rales present. Less likely for pneumonia, COVID infection, pulmonary effusion, acute asthma exacerbation, or acute on chronic heart failure exacerbation. Most likely caused by acute bronchitis. Discussed no indication for antibiotics at this time. Advised patient treatment is symptomatic and provided patient with cough medication. Advised patient to use albuterol inhaler PRN when she notices wheezing. Counseled patient to return to office if she does not notice an improvement in symptoms.  - albuterol (VENTOLIN HFA) 108 (90 Base) MCG/ACT inhaler; Inhale 2 puffs into the lungs every 6 (six) hours as needed for wheezing or shortness of breath.  Dispense: 8 g; Refill: 2  2. Acute cough See #1 - benzonatate (TESSALON  PERLES) 100 MG capsule; Take 1 capsule (100 mg total) by mouth 3 (three) times daily as needed for cough.  Dispense: 20 capsule; Refill: 0 - promethazine-dextromethorphan (PROMETHAZINE-DM) 6.25-15 MG/5ML syrup; Take 5 mLs by mouth 4 (four) times daily as needed for cough (Maximum dose: 30mL in 24 hours).  Dispense: 118 mL; Refill: 0  3. Skin exam, screening for cancer Patient mentioned she would like a dermatology referral for routine skin cancer screening.  - Ambulatory referral to Dermatology   Return if symptoms worsen or fail to improve.   Alyson Reedy, FNP

## 2023-01-06 ENCOUNTER — Other Ambulatory Visit (HOSPITAL_BASED_OUTPATIENT_CLINIC_OR_DEPARTMENT_OTHER): Payer: Self-pay | Admitting: Family Medicine

## 2023-01-06 DIAGNOSIS — F330 Major depressive disorder, recurrent, mild: Secondary | ICD-10-CM

## 2023-01-09 ENCOUNTER — Encounter (HOSPITAL_BASED_OUTPATIENT_CLINIC_OR_DEPARTMENT_OTHER): Payer: Self-pay | Admitting: Family Medicine

## 2023-01-09 ENCOUNTER — Ambulatory Visit (HOSPITAL_BASED_OUTPATIENT_CLINIC_OR_DEPARTMENT_OTHER): Payer: Medicare PPO | Admitting: Family Medicine

## 2023-01-09 VITALS — BP 106/63 | HR 52 | Ht 67.5 in | Wt 165.4 lb

## 2023-01-09 DIAGNOSIS — R0609 Other forms of dyspnea: Secondary | ICD-10-CM | POA: Diagnosis not present

## 2023-01-09 DIAGNOSIS — R5383 Other fatigue: Secondary | ICD-10-CM

## 2023-01-09 DIAGNOSIS — F330 Major depressive disorder, recurrent, mild: Secondary | ICD-10-CM | POA: Diagnosis not present

## 2023-01-09 DIAGNOSIS — M2550 Pain in unspecified joint: Secondary | ICD-10-CM | POA: Diagnosis not present

## 2023-01-09 MED ORDER — BUPROPION HCL ER (XL) 150 MG PO TB24: 150.0000 mg | ORAL_TABLET | Freq: Every day | ORAL | 3 refills | Status: DC

## 2023-01-09 MED ORDER — DULOXETINE HCL 20 MG PO CPEP: 20.0000 mg | ORAL_CAPSULE | Freq: Every day | ORAL | 0 refills | Status: DC

## 2023-01-09 NOTE — Patient Instructions (Addendum)
Stop taking 40mg  duloxetine (Cymbalta)  Start taking 20mg  duloxetine (Cymbalta) for 14 days  If you have any withdrawal symptoms (nausea, dizziness, sweating, fatigue, mood swings, irritability), please reach out  Continue taking Wellbutrin 150mg  daily

## 2023-01-09 NOTE — Progress Notes (Unsigned)
   Established Patient Office Visit  Subjective  Patient ID: [name], female    DOB: 31-Jul-1955  Age: 67 y.o. MRN: [id number]  [name] is a pleasant 67 year old female patient who presents today for a mood follow-up. She is currently taking duloxetine 40mg  daily and Wellbutrin 150mg  daily.    She reports she is not as stressed.   Fibromyalgia- restless legs, denies improvement with gabapentin  she would like to be tested for this  Sister has polymyalgia  Pain all over body and does not like people touching her- feels uncomfortable   Neurologist- checked for Parkinson's  Dr. Blanchie Serve   Rheumatology- has not   Feels her voice changing   ROS    Objective:     BP 106/63   Pulse (!) 52   Ht 5' 7.5" (1.715 m)   Wt 165 lb 6.4 oz (75 kg)   SpO2 100%   BMI 25.52 kg/m  BP Readings from Last 3 Encounters:  01/09/23 106/63  12/27/22 102/80  12/05/22 (!) 119/49     Physical Exam    Assessment & Plan:  Arthralgia, unspecified joint -     Sedimentation rate -     C-reactive protein -     Rheumatoid factor -     ANA w/Reflex if Positive  Mild recurrent major depression (HCC) -     buPROPion HCl ER (XL); Take 1 tablet (150 mg total) by mouth daily.  Dispense: 90 tablet; Refill: 3  Fatigue, unspecified type -     CBC with Differential/Platelet -     Rheumatoid factor -     ANA w/Reflex if Positive  Dyspnea on exertion -     Brain natriuretic peptide  Other orders -     DULoxetine HCl; Take 1 capsule (20 mg total) by mouth daily.  Dispense: 30 capsule; Refill: 0     Return in about 3 months (around 04/09/2023) for prefers [place], [place].    Alyson Reedy, FNP

## 2023-01-10 LAB — CBC WITH DIFFERENTIAL/PLATELET
Basophils Absolute: 0.1 10*3/uL (ref 0.0–0.2)
Basos: 1 %
EOS (ABSOLUTE): 0.5 10*3/uL — ABNORMAL HIGH (ref 0.0–0.4)
Eos: 5 %
Hematocrit: 43.1 % (ref 34.0–46.6)
Hemoglobin: 14 g/dL (ref 11.1–15.9)
Immature Grans (Abs): 0 10*3/uL (ref 0.0–0.1)
Immature Granulocytes: 0 %
Lymphocytes Absolute: 2.4 10*3/uL (ref 0.7–3.1)
Lymphs: 25 %
MCH: 29.8 pg (ref 26.6–33.0)
MCHC: 32.5 g/dL (ref 31.5–35.7)
MCV: 92 fL (ref 79–97)
Monocytes Absolute: 1.1 10*3/uL — ABNORMAL HIGH (ref 0.1–0.9)
Monocytes: 11 %
Neutrophils Absolute: 5.6 10*3/uL (ref 1.4–7.0)
Neutrophils: 58 %
Platelets: 356 10*3/uL (ref 150–450)
RBC: 4.7 x10E6/uL (ref 3.77–5.28)
RDW: 12.3 % (ref 11.7–15.4)
WBC: 9.6 10*3/uL (ref 3.4–10.8)

## 2023-01-10 LAB — C-REACTIVE PROTEIN: CRP: 6 mg/L (ref 0–10)

## 2023-01-10 LAB — RHEUMATOID FACTOR: Rheumatoid fact SerPl-aCnc: <10 [IU]/mL (ref <14.0)

## 2023-01-10 LAB — SEDIMENTATION RATE: Sed Rate: 4 mm/h (ref 0–40)

## 2023-01-10 LAB — ANA W/REFLEX IF POSITIVE: Anti Nuclear Antibody (ANA): NEGATIVE

## 2023-01-10 LAB — BRAIN NATRIURETIC PEPTIDE: BNP: 28.3 pg/mL (ref 0.0–100.0)

## 2023-01-12 NOTE — Progress Notes (Deleted)
 NEUROLOGY CONSULTATION NOTE  [name] MRN: [id number] DOB: 05/22/1955  Referring provider: Evalene Arts, FNP Primary care provider: Evalene Arts, FNP  Reason for consult:  headaches  Assessment/Plan:   ***   Subjective:  [name] is a 68 year old ***-handed female with paroxysmal atrial fibrillation, CHF, HTN, B12 deficiency, hypothyroidism, essential tremor, chronic pain syndrome, and history of basal cell and squamous cell carcinoma who presents for headaches.  History supplemented by prior neurologist's and referring provider's notes.  Onset:  *** Location:  *** Quality:  *** Intensity:  ***.  *** denies new headache, thunderclap headache or severe headache that wakes *** from sleep. Aura:  *** Prodrome:  *** Postdrome:  *** Associated symptoms:  ***.  *** denies associated unilateral numbness or weakness. Duration:  *** Frequency:  *** Frequency of abortive medication: *** Triggers:  *** Relieving factors:  *** Activity:  ***  Past NSAIDS/analgesics:  acetaminophen , tramadol  Past abortive triptans:  *** Past abortive ergotamine:  *** Past muscle relaxants:  cyclobenzaprine , baclofen, methocarbamol  Past anti-emetic:  *** Past antihypertensive medications:  propranolol , carvedilol , lisinopril , furosemide  Past antidepressant medications:  amitriptyline , imipramine  Past anticonvulsant medications:  topiramate, gabapentin  Past anti-CGRP:  Ajovy, Qulipta  60mg  Past vitamins/supplements:  magnesium Other past therapies:  ***  Current NSAIDS/analgesics:  *** Current triptans:  eletriptan  40mg  Current ergotamine:  none Current anti-emetic:  none Current muscle relaxants:  none Current Antihypertensive medications:  none Current Antidepressant medications:  duloxetine  20mg  daily, bupropion  XL 150mg  Current Anticonvulsant medications:  none Current anti-CGRP:  none Other therapy:  Botox ? *** Other medications:  Eliquis    Caffeine:   *** Alcohol :  *** Smoker:  *** Diet:  *** Exercise:  *** Depression:  ***; Anxiety:  *** Other pain:  *** Sleep hygiene:  *** Family history of headache:  ***      PAST MEDICAL HISTORY: Past Medical History:  Diagnosis Date   AF (paroxysmal atrial fibrillation) (HCC)    Arthritis    BCC (basal cell carcinoma) 03/11/2003   left distal lower leg ankle-tx dr helga   Biventricular cardiac pacemaker in situ    a. prior CRT-D in 2013 with downgrade to CRT-Pacemaker only in 11/2016.   Breast cancer (HCC) 12/12/2019   Chronic systolic CHF (congestive heart failure) (HCC)    Complication of anesthesia    aspiration   Essential tremor    GERD (gastroesophageal reflux disease)    otc   Hypertension    Hypothyroidism    LBBB (left bundle branch block)    conduction disorder   Migraines    Mild aortic insufficiency    NICM (nonischemic cardiomyopathy) (HCC)    a. 2012: normal coronaries, EF 25-30%. b. improved to 55% 11/2016.   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   in situ- right lower leg-anterior (CX35FU)   Scoliosis     PAST SURGICAL HISTORY: Past Surgical History:  Procedure Laterality Date   ARTERY BIOPSY  12/29/2011   Procedure: MINOR BIOPSY TEMPORAL ARTERY;  Surgeon: Morene ONEIDA Olives, MD;  Location: [facility];  Service: General;  Laterality: Right;  Right temporal artery biopsy   BACK SURGERY  12/31/2009   for flat back syndrome; fused bottom of my back   BI-VENTRICULAR IMPLANTABLE CARDIOVERTER DEFIBRILLATOR Left  01/20/2011   Procedure: BI-VENTRICULAR IMPLANTABLE CARDIOVERTER DEFIBRILLATOR  (CRT-D);  Surgeon: Danelle LELON Birmingham, MD;  Location: [facility] CATH LAB;  Service: Cardiovascular;  Laterality: Left;   BIV ICD GENERATOR CHANGEOUT N/A  11/24/2016   Procedure: BIV ICD GENERATOR CHANGEOUT;  Surgeon: Birmingham Danelle LELON, MD;  Location: [facility] INVASIVE CV LAB;  Service: Cardiovascular;  Laterality: N/A;   BREAST LUMPECTOMY WITH RADIOACTIVE SEED LOCALIZATION Left 01/22/2020   Procedure: LEFT BREAST LUMPECTOMY WITH RADIOACTIVE SEED LOCALIZATION;  Surgeon: Belinda Cough, MD;  Location: [facility];  Service: General;  Laterality: Left;  LMA   CHOLECYSTECTOMY  ~1996   FOOT NEUROMA SURGERY  ~ 2003   right   RE-EXCISION OF BREAST LUMPECTOMY Left 03/03/2020   Procedure: RE-EXCISION MARGINS OF LEFT BREAST LUMPECTOMY;  Surgeon: Belinda Cough, MD;  Location: [facility];  Service: General;  Laterality: Left;   ROTATOR CUFF REPAIR  ~ 2009   left   SHOULDER ARTHROSCOPY W/ ROTATOR CUFF REPAIR Right 09/21/2011   SKIN CANCER EXCISION     nose, forehead, left leg   TEE WITHOUT CARDIOVERSION N/A 03/01/2017   Procedure: TRANSESOPHAGEAL ECHOCARDIOGRAM (TEE);  Surgeon: Raford Riggs, MD;  Location: [facility] ENDOSCOPY;  Service: Cardiovascular;  Laterality: N/A;   THYROID  LOBECTOMY  ~ 2006   right   TUBAL LIGATION  ~ 1983   VAGINAL HYSTERECTOMY  ~ 2009   VESICOVAGINAL FISTULA CLOSURE W/ TAH      MEDICATIONS: Current Outpatient Medications on File Prior to Visit  Medication Sig Dispense Refill   acetaminophen  (TYLENOL ) 325 MG tablet Take 650 mg by mouth as needed.     albuterol  (VENTOLIN  HFA) 108 (90 Base) MCG/ACT inhaler Inhale 2 puffs into the lungs every 6 (six) hours as needed for wheezing or shortness of breath. 8 g 2   ALPRAZolam  (XANAX ) 0.5 MG tablet Take 0.5 mg by mouth 3 (three) times daily as needed for anxiety.     anastrozole  (ARIMIDEX ) 1 MG tablet Take 1 tablet (1 mg total) by mouth daily. 90 tablet 0   apixaban  (ELIQUIS ) 5 MG TABS tablet Take 1 tablet (5 mg total) by mouth 2 (two) times daily. 180 tablet 1   Azelaic Acid 15 % gel Apply 1 Application topically 2 (two) times daily.     benzonatate  (TESSALON   PERLES) 100 MG capsule Take 1 capsule (100 mg total) by mouth 3 (three) times daily as needed for cough. 20 capsule 0   BOTOX 200 units injection Once every 4 months     buPROPion  (WELLBUTRIN  XL) 150 MG 24 hr tablet Take 1 tablet (150 mg total) by mouth daily. 90 tablet 3   CALCIUM PO Take 2 tablets by mouth daily.     cetirizine (ZYRTEC) 10 MG tablet Take 10 mg by mouth daily.     Cyanocobalamin  1000 MCG/ML KIT Inject 1,000 mcg every 30 (thirty) days as directed.     doxycycline  (VIBRAMYCIN ) 50 MG capsule Take 50 mg by mouth daily.     DULoxetine  (CYMBALTA ) 20 MG capsule Take 1 capsule (20 mg total) by mouth daily. 30 capsule 0   eletriptan  (RELPAX ) 40 MG tablet Take 1 tablet (40 mg total) by mouth every 2 (two) hours as needed for migraine or headache. May repeat in 2 hours if headache persists or recurs. 10 tablet 2   famotidine  (PEPCID ) 20 MG tablet Take 20 mg by mouth daily as needed for heartburn or indigestion.     gabapentin  (NEURONTIN ) 600 MG tablet Take 600 mg by  mouth 2 (two) times daily.     ketoconazole  (NIZORAL ) 2 % cream Apply to corner of mouth nightly 15 g 0   levothyroxine  (SYNTHROID , LEVOTHROID) 100 MCG tablet Take 100 mcg daily before breakfast by mouth.     Melatonin 5 MG CAPS Take 5 mg by mouth at bedtime.     Menthol  (BIOFREEZE) 10 % AERO      Omega-3 Fatty Acids (FISH OIL) 1000 MG CAPS 1 capsule     omeprazole  (PRILOSEC) 40 MG capsule Take 1 capsule (40 mg total) by mouth daily. 90 capsule 0   polyethylene glycol (MIRALAX  / GLYCOLAX ) packet Take 17 g by mouth every evening.     propranolol  (INDERAL ) 60 MG tablet Take 1 tablet (60 mg total) by mouth daily. 90 tablet 3   spironolactone  (ALDACTONE ) 25 MG tablet Take 0.5 tablets (12.5 mg total) by mouth daily. 45 tablet 3   Varenicline  Tartrate (TYRVAYA ) 0.03 MG/ACT SOLN Place into the nose.     VITAMIN D  PO Take 2 capsules by mouth daily.     No current facility-administered medications on file prior to visit.     ALLERGIES: Allergies  Allergen Reactions   Other Nausea And Vomiting and Other (See Comments)    Most narcotic pain meds cause N/V and CHEST PAIN; needs an antiemetic to tolerate  Also, patient was diagnosed with Raynaud's disease and was told to NOT place IV(s) in either hand because of poor circulation   Iodine Other (See Comments)    Blisters   Tape Other (See Comments)    PATIENT'S SKIN IS VERY THIN; IT TEARS, BRUISES, AND BLEEDS VERY EASILY (PLEASE USE COBAN WRAP, IF POSSIBLE!!)   Codeine Other (See Comments)    Chest pain   Fentanyl  Other (See Comments)    Bad dreams   Lexapro  [Escitalopram ] Rash   Morphine  And Codeine Nausea And Vomiting    FAMILY HISTORY: Family History  Problem Relation Age of Onset   Heart disease Mother    Heart failure Mother 27       CHF   Stroke Mother    Hypertension Mother    Heart disease Father 60   Macular degeneration Father    Heart disease Sister    Atrial fibrillation Sister    Heart disease Sister     Objective:  *** General: No acute distress.  Patient appears well-groomed.   Head:  Normocephalic/atraumatic Eyes:  fundi examined but not visualized Neck: supple, no paraspinal tenderness, full range of motion Back: No paraspinal tenderness Heart: regular rate and rhythm Lungs: Clear to auscultation bilaterally. Vascular: No carotid bruits. Neurological Exam: Mental status: alert and oriented to person, place, and time, speech fluent and not dysarthric, language intact. Cranial nerves: CN I: not tested CN II: pupils equal, round and reactive to light, visual fields intact CN III, IV, VI:  full range of motion, no nystagmus, no ptosis CN V: facial sensation intact. CN VII: upper and lower face symmetric CN VIII: hearing intact CN IX, X: gag intact, uvula midline CN XI: sternocleidomastoid and trapezius muscles intact CN XII: tongue midline Bulk & Tone: normal, no fasciculations. Motor:  muscle strength 5/5  throughout Sensation:  Pinprick, temperature and vibratory sensation intact. Deep Tendon Reflexes:  2+ throughout,  toes downgoing.   Finger to nose testing:  Without dysmetria.   Heel to shin:  Without dysmetria.   Gait:  Normal station and stride.  Romberg negative.    Thank you for allowing me to take part  in the care of this patient.  Juliene Dunnings, DO  CC: ***

## 2023-01-13 ENCOUNTER — Ambulatory Visit: Payer: Medicare PPO | Admitting: Neurology

## 2023-01-14 ENCOUNTER — Emergency Department (HOSPITAL_BASED_OUTPATIENT_CLINIC_OR_DEPARTMENT_OTHER): Payer: Medicare PPO

## 2023-01-14 ENCOUNTER — Emergency Department (HOSPITAL_BASED_OUTPATIENT_CLINIC_OR_DEPARTMENT_OTHER)
Admission: EM | Admit: 2023-01-14 | Discharge: 2023-01-14 | Disposition: A | Discharge status: Home / Self Care | Payer: Medicare PPO | Attending: Emergency Medicine | Admitting: Emergency Medicine

## 2023-01-14 ENCOUNTER — Encounter (HOSPITAL_BASED_OUTPATIENT_CLINIC_OR_DEPARTMENT_OTHER): Payer: Self-pay | Admitting: Urology

## 2023-01-14 DIAGNOSIS — Z7901 Long term (current) use of anticoagulants: Secondary | ICD-10-CM | POA: Diagnosis not present

## 2023-01-14 DIAGNOSIS — R58 Hemorrhage, not elsewhere classified: Secondary | ICD-10-CM | POA: Diagnosis not present

## 2023-01-14 DIAGNOSIS — S81811A Laceration without foreign body, right lower leg, initial encounter: Secondary | ICD-10-CM | POA: Insufficient documentation

## 2023-01-14 DIAGNOSIS — W01198A Fall on same level from slipping, tripping and stumbling with subsequent striking against other object, initial encounter: Secondary | ICD-10-CM | POA: Insufficient documentation

## 2023-01-14 DIAGNOSIS — S8991XA Unspecified injury of right lower leg, initial encounter: Secondary | ICD-10-CM | POA: Diagnosis present

## 2023-01-14 DIAGNOSIS — S91011A Laceration without foreign body, right ankle, initial encounter: Secondary | ICD-10-CM | POA: Diagnosis not present

## 2023-01-14 DIAGNOSIS — R001 Bradycardia, unspecified: Secondary | ICD-10-CM | POA: Diagnosis not present

## 2023-01-14 DIAGNOSIS — Z23 Encounter for immunization: Secondary | ICD-10-CM | POA: Diagnosis not present

## 2023-01-14 DIAGNOSIS — W19XXXA Unspecified fall, initial encounter: Secondary | ICD-10-CM | POA: Diagnosis not present

## 2023-01-14 DIAGNOSIS — S8990XA Unspecified injury of unspecified lower leg, initial encounter: Secondary | ICD-10-CM | POA: Diagnosis not present

## 2023-01-14 MED ORDER — ALPRAZOLAM 0.5 MG PO TABS
0.5000 mg | ORAL_TABLET | Freq: Once | ORAL | Status: AC
  Administered 2023-01-14: 0.5 mg via ORAL
  Filled 2023-01-14: qty 1

## 2023-01-14 MED ORDER — LIDOCAINE-EPINEPHRINE (PF) 2 %-1:200000 IJ SOLN
10.0000 mL | Freq: Once | INTRAMUSCULAR | Status: AC
  Administered 2023-01-14: 10 mL
  Filled 2023-01-14: qty 20

## 2023-01-14 MED ORDER — LIDOCAINE-EPINEPHRINE-TETRACAINE (LET) TOPICAL GEL
3.0000 mL | Freq: Once | TOPICAL | Status: AC
  Administered 2023-01-14: 3 mL via TOPICAL
  Filled 2023-01-14: qty 3

## 2023-01-14 MED ORDER — TETANUS-DIPHTH-ACELL PERTUSSIS 5-2.5-18.5 LF-MCG/0.5 IM SUSY
0.5000 mL | PREFILLED_SYRINGE | Freq: Once | INTRAMUSCULAR | Status: AC
  Administered 2023-01-14: 0.5 mL via INTRAMUSCULAR
  Filled 2023-01-14: qty 0.5

## 2023-01-14 NOTE — ED Provider Notes (Signed)
 [place] EMERGENCY DEPARTMENT AT [facility] Provider Note   CSN: [id number] Arrival date & time: 01/14/23  1349     History  Chief Complaint  Patient presents with   Laceration    [name] is a 68 y.o. female.  Patient with history of afib on Eliquis  presents today with complaints of right lower leg laceration. She states that same occurred immediately prior to arrival today when she was cleaning out her closet and the book fell off a shelf and struck her in the right lower leg.  She has had persistent bleeding to same and has been unable to control hemostasis since the event occurred.  She has been able to walk since with some discomfort.  She has good sensation in her foot.  She is unsure of her last tetanus.  Denies any other injuries or complaints.  The history is provided by the patient. No language interpreter was used.  Laceration      Home Medications Prior to Admission medications   Medication Sig Start Date End Date Taking? Authorizing Provider  acetaminophen  (TYLENOL ) 325 MG tablet Take 650 mg by mouth as needed.    [provider]  albuterol  (VENTOLIN  HFA) 108 (90 Base) MCG/ACT inhaler Inhale 2 puffs into the lungs every 6 (six) hours as needed for wheezing or shortness of breath. 12/27/22   Towana Small, FNP  ALPRAZolam  (XANAX ) 0.5 MG tablet Take 0.5 mg by mouth 3 (three) times daily as needed for anxiety.    [provider]  anastrozole  (ARIMIDEX ) 1 MG tablet Take 1 tablet (1 mg total) by mouth daily. 11/17/22   Cloretta Arley NOVAK, MD  apixaban  (ELIQUIS ) 5 MG TABS tablet Take 1 tablet (5 mg total) by mouth 2 (two) times daily. 11/24/22   Lonni Slain, MD  Azelaic Acid 15 % gel Apply 1 Application topically 2 (two) times daily. 07/18/22   [provider]  benzonatate  (TESSALON  PERLES) 100 MG capsule Take 1 capsule (100 mg total) by mouth 3 (three) times daily as needed for cough. 12/27/22   Towana Small, FNP   BOTOX 200 units injection Once every 4 months 11/08/22   [provider]  buPROPion  (WELLBUTRIN  XL) 150 MG 24 hr tablet Take 1 tablet (150 mg total) by mouth daily. 01/09/23   Towana Small, FNP  CALCIUM PO Take 2 tablets by mouth daily.    [provider]  cetirizine (ZYRTEC) 10 MG tablet Take 10 mg by mouth daily.    [provider]  Cyanocobalamin  1000 MCG/ML KIT Inject 1,000 mcg every 30 (thirty) days as directed.    [provider]  doxycycline  (VIBRAMYCIN ) 50 MG capsule Take 50 mg by mouth daily. 06/24/22   [provider]  DULoxetine  (CYMBALTA ) 20 MG capsule Take 1 capsule (20 mg total) by mouth daily. 01/09/23   Butler, Kristina, FNP  eletriptan  (RELPAX ) 40 MG tablet Take 1 tablet (40 mg total) by mouth every 2 (two) hours as needed for migraine or headache. May repeat in 2 hours if headache persists or recurs. 12/05/22   Towana Small, FNP  famotidine  (PEPCID ) 20 MG tablet Take 20 mg by mouth daily as needed for heartburn or indigestion.    [provider]  gabapentin  (NEURONTIN ) 600 MG tablet Take 600 mg by mouth 2 (two) times daily.    [provider]  ketoconazole  (NIZORAL ) 2 % cream Apply to corner of mouth nightly 09/08/20   Sheffield, Kelli R, PA-C  levothyroxine  (SYNTHROID , LEVOTHROID) 100 MCG  tablet Take 100 mcg daily before breakfast by mouth.    [provider]  Melatonin 5 MG CAPS Take 5 mg by mouth at bedtime.    [provider]  Menthol  (BIOFREEZE) 10 % AERO     [provider]  Omega-3 Fatty Acids (FISH OIL) 1000 MG CAPS 1 capsule    [provider]  omeprazole  (PRILOSEC) 40 MG capsule Take 1 capsule (40 mg total) by mouth daily. 11/24/22   Butler, Kristina, FNP  polyethylene glycol (MIRALAX  / GLYCOLAX ) packet Take 17 g by mouth every evening.    [provider]  propranolol  (INDERAL ) 60 MG tablet Take 1 tablet (60 mg total) by mouth daily. 11/10/20   Leverne Charlies Helling, PA-C  spironolactone  (ALDACTONE ) 25 MG tablet Take 0.5 tablets (12.5 mg total) by mouth daily. 12/05/22   Waddell Danelle ORN, MD  Varenicline  Tartrate (TYRVAYA ) 0.03 MG/ACT SOLN Place into the nose.    [provider]  VITAMIN D  PO Take 2 capsules by mouth daily.    [provider]      Allergies    Other, Iodine, Tape, Codeine, Fentanyl , Lexapro  [escitalopram ], and Morphine  and codeine    Review of Systems   Review of Systems  Skin:  Positive for wound.  All other systems reviewed and are negative.   Physical Exam Updated Vital Signs BP (!) 98/53   Pulse (!) 51   Temp 97.6 F (36.4 C) (Oral)   Resp 15   Ht 5' 8 (1.727 m)   Wt 75 kg   SpO2 100%   BMI 25.14 kg/m  Physical Exam Vitals and nursing note reviewed.  Constitutional:      General: She is not in acute distress.    Appearance: Normal appearance. She is normal weight. She is not ill-appearing, toxic-appearing or diaphoretic.  HENT:     Head: Normocephalic and atraumatic.  Cardiovascular:     Rate and Rhythm: Normal rate.  Pulmonary:     Effort: Pulmonary effort is normal. No respiratory distress.  Musculoskeletal:        General: Normal range of motion.     Cervical back: Normal range of motion.  Skin:    General: Skin is warm and dry.     Comments: Large gaping laceration noted to the anterior aspect of the distal right shin area. Small area of pulsatile bleeding noted within the wound. No signs of foreign body. DP and PT pulses intact and 2+. Distal sensation intact.  Neurological:     General: No focal deficit present.     Mental Status: She is alert.  Psychiatric:        Mood and Affect: Mood normal.        Behavior: Behavior normal.     ED Results / Procedures / Treatments   Labs (all labs ordered are listed, but only abnormal results are displayed) Labs Reviewed - No data to display  EKG None  Radiology DG Ankle Complete Right Result Date: 01/14/2023 CLINICAL DATA:   Laceration. EXAM: RIGHT ANKLE - COMPLETE 3+ VIEW COMPARISON:  None Available. FINDINGS: There is no evidence of fracture, dislocation, or joint effusion. There is no evidence of arthropathy or other focal bone abnormality. Soft tissue laceration is seen near the medial ankle. No radiopaque foreign body. IMPRESSION: No radiopaque foreign body or acute osseous injury. Electronically Signed   By: Norman Hopper M.D.   On: 01/14/2023 18:24    Procedures .Laceration Repair  Date/Time: 01/14/2023 7:38 PM  Performed by: Nora Lauraine LABOR, PA-C Authorized by: Nora Lauraine LABOR, PA-C   Consent:    Consent obtained:  Verbal   Consent given by:  Patient   Risks, benefits, and alternatives were discussed: yes     Risks discussed:  Infection, need for additional repair, nerve damage, poor wound healing, poor cosmetic result, pain, retained foreign body, tendon damage and vascular damage   Alternatives discussed:  No treatment, delayed treatment, observation and referral Universal protocol:    Procedure explained and questions answered to patient or proxy's satisfaction: yes     Imaging studies available: yes     Patient identity confirmed:  Verbally with patient Anesthesia:    Anesthesia method:  Topical application and local infiltration   Topical anesthetic:  LET   Local anesthetic:  Lidocaine  2% WITH epi Laceration details:    Location:  Leg   Leg location:  R lower leg   Length (cm):  8   Depth (mm):  3 Pre-procedure details:    Preparation:  Imaging obtained to evaluate for foreign bodies Exploration:    Hemostasis achieved with:  Tied off vessels (Tied off 1 pulsatile vessel within her wound with 5-0 vicryl rapid)   Imaging obtained: x-ray     Imaging outcome: foreign body not noted     Wound exploration: wound explored through full range of motion and entire depth of wound visualized     Wound extent: vascular damage   Treatment:    Area cleansed with:  Saline   Amount of cleaning:   Extensive   Irrigation solution:  Sterile saline   Irrigation volume:  1 L   Irrigation method:  Pressure wash Skin repair:    Repair method:  Sutures   Suture size:  4-0   Suture material:  Prolene   Suture technique:  Simple interrupted and horizontal mattress   Number of sutures:  20 Approximation:    Approximation:  Close Repair type:    Repair type:  Complex Post-procedure details:    Dressing:  Antibiotic ointment, non-adherent dressing and adhesive bandage   Procedure completion:  Tolerated well, no immediate complications     Medications Ordered in ED Medications  Tdap (BOOSTRIX ) injection 0.5 mL (0.5 mLs Intramuscular Given 01/14/23 1611)  lidocaine -EPINEPHrine -tetracaine  (LET) topical gel (3 mLs Topical Given 01/14/23 1613)  lidocaine -EPINEPHrine  (XYLOCAINE  W/EPI) 2 %-1:200000 (PF) injection 10 mL (10 mLs Infiltration Given by Other 01/14/23 1612)  ALPRAZolam  (XANAX ) tablet 0.5 mg (0.5 mg Oral Given 01/14/23 1611)    ED Course/ Medical Decision Making/ A&P                                 Medical Decision Making Amount and/or Complexity of Data Reviewed Radiology: ordered.  Risk Prescription drug management.   Patient presents today with complaints of right lower leg laceration which occurred immediately prior to arrival today.  She is afebrile, nontoxic-appearing, and in no acute distress with reassuring vital signs.  Physical exam reveals 8 cm gaping laceration noted to the right lower anterior shin area.  There was a small pulsatile vessel that was bleeding actively, hemostasis achieved by tying off this vessel with an absorbable suture.  DP and PT pulses intact and 2+.  X-ray imaging obtained which has resulted and reveals  No acute findings.  I have personally reviewed and interpreted this imaging and agree with radiology interpretation.  Pressure irrigation performed of patients wound. Wound explored  and base of wound visualized in a bloodless field without evidence  of foreign body.  Laceration occurred < 8 hours prior to repair which was well tolerated. Tdap updated.  Pt has no comorbidities to effect normal wound healing. Pt discharged without antibiotics.  Discussed suture home care with patient and answered questions. Pt to follow-up for wound check and suture removal in 7-10 days; they are to return to the ED sooner for signs of infection. Pt is hemodynamically stable with no complaints prior to dc. Evaluation and diagnostic testing in the emergency department does not suggest an emergent condition requiring admission or immediate intervention beyond what has been performed at this time.  Plan for discharge with close PCP follow-up.  Patient is understanding and amenable with plan, educated on red flag symptoms that would prompt immediate return.  Patient discharged in stable condition.  Final Clinical Impression(s) / ED Diagnoses Final diagnoses:  Leg laceration, right, initial encounter    Rx / DC Orders ED Discharge Orders     None     An After Visit Summary was printed and given to the patient.     Aviyah Swetz A, PA-C 01/14/23 1949    Tegeler, Lonni PARAS, MD 01/14/23 2203

## 2023-01-14 NOTE — Discharge Instructions (Signed)
 You were seen in the emergency department for your right lower leg laceration.   We have closed your laceration(s) with 20 nonabsorbable sutures. These need to be removed in 7-10 days. This can be done at any doctor's office, urgent care, or emergency department.   If any of the sutures come out before it is time for removal, that is okay. Make sure to keep the area as clean and dry as possible. You can let warm soapy warm run over the area, but do NOT scrub it.   Watch out for signs of infection, like we discussed, including: increased redness, tenderness, or drainage of pus from the area. If this happens and you have not been prescribed an antibiotic, please seek medical attention for possible infection.   You can take over the counter pain medicine like ibuprofen  or tylenol  as needed.   Return if development of any new or worsening symptoms.

## 2023-01-14 NOTE — ED Triage Notes (Signed)
 Per EMS pt had book fall onto right lower leg with large lac/skin tear Pressure dressing applied, Pt is on eliquis

## 2023-01-14 NOTE — ED Notes (Signed)
 PA at bedside.

## 2023-01-19 ENCOUNTER — Encounter (HOSPITAL_BASED_OUTPATIENT_CLINIC_OR_DEPARTMENT_OTHER): Payer: Self-pay | Admitting: *Deleted

## 2023-01-20 ENCOUNTER — Encounter (HOSPITAL_BASED_OUTPATIENT_CLINIC_OR_DEPARTMENT_OTHER): Payer: Self-pay | Admitting: Family Medicine

## 2023-01-20 ENCOUNTER — Ambulatory Visit (HOSPITAL_BASED_OUTPATIENT_CLINIC_OR_DEPARTMENT_OTHER): Payer: Self-pay | Admitting: Family Medicine

## 2023-01-20 ENCOUNTER — Encounter: Payer: Self-pay | Admitting: Family Medicine

## 2023-01-20 ENCOUNTER — Ambulatory Visit: Payer: Medicare PPO | Admitting: Family Medicine

## 2023-01-20 VITALS — BP 96/60 | Temp 97.0°F | Ht 68.0 in | Wt 166.4 lb

## 2023-01-20 DIAGNOSIS — L03115 Cellulitis of right lower limb: Secondary | ICD-10-CM | POA: Diagnosis not present

## 2023-01-20 MED ORDER — CEPHALEXIN 500 MG PO CAPS: 500.0000 mg | ORAL_CAPSULE | Freq: Three times a day (TID) | ORAL | 0 refills | Status: DC

## 2023-01-20 NOTE — Patient Instructions (Addendum)
 I am worried about a local skin infection/cellulitis with redness, warmth and tenderness around the wound that seems to be increasing- treat with antibiotic keflex /cephalexin  3x a day for the next 7 days. Its day 6 today and maybe a little early to remove sutures plus you are so tender today I'm hoping removal will be easier after antibiotics and continued elevation. Schedule a visit on Monday or Tuesday for recheck with your [facility]  Recommended follow up: Monday or tuesday

## 2023-01-20 NOTE — Addendum Note (Signed)
 Addended by: Shelva Majestic on: 01/20/2023 02:43 PM   Modules accepted: Orders

## 2023-01-20 NOTE — Progress Notes (Addendum)
 Phone [phone] In person visit   Subjective:   [name] is a 68 y.o. year old very pleasant female patient who presents for/with See problem oriented charting Chief Complaint  Patient presents with   Wound Infection    Pt states she has recently been seen at [facility] for wound to right leg.    Past Medical History-  Patient Active Problem List   Diagnosis Date Noted   Biventricular ICD (implantable cardioverter-defibrillator) in place    DOE (dyspnea on exertion) 04/07/2021   Age-related osteoporosis without current pathological fracture 11/29/2020   Atrophy of vagina 11/29/2020   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   Encounter for counseling 09/17/2020   Breast CA (HCC) 02/18/2020   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   Rosacea 11/05/2019   Scar 11/05/2019   Biventricular cardiac pacemaker in situ 10/08/2018   Long term current use of anticoagulant therapy 10/23/2017   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   Trigeminal neuralgia 12/14/2012   Insomnia, persistent 11/03/2011   AICD (automatic cardioverter/defibrillator) present    Aortic regurgitation    Nonischemic cardiomyopathy (HCC) 01/21/2011   Esophagitis, reflux 08/24/2010   Idiopathic peripheral neuropathy 08/24/2010   Chronic systolic CHF (congestive heart failure) (HCC) 02/24/2010   Paroxysmal atrial fibrillation (HCC) 02/23/2010   Hypothyroidism    History of migraine headaches    Left bundle branch block    Idiopathic scoliosis and kyphoscoliosis     Class: Chronic   Lumbar canal stenosis 12/25/2009   Anxiety state 07/24/2008   Avitaminosis D 03/21/2008   Atypical migraine 01/30/2008   Benign essential HTN 01/30/2008   Paroxysmal digital cyanosis 02/15/2007    Medications- reviewed and updated Current Outpatient Medications  Medication Sig Dispense Refill   acetaminophen  (TYLENOL ) 325 MG tablet Take 650 mg by mouth as needed.     albuterol  (VENTOLIN  HFA)  108 (90 Base) MCG/ACT inhaler Inhale 2 puffs into the lungs every 6 (six) hours as needed for wheezing or shortness of breath. 8 g 2   ALPRAZolam  (XANAX ) 0.5 MG tablet Take 0.5 mg by mouth 3 (three) times daily as needed for anxiety.     anastrozole  (ARIMIDEX ) 1 MG tablet Take 1 tablet (1 mg total) by mouth daily. 90 tablet 0   apixaban  (ELIQUIS ) 5 MG TABS tablet Take 1 tablet (5 mg total) by mouth 2 (two) times daily. 180 tablet 1   Azelaic Acid 15 % gel Apply 1 Application topically 2 (two) times daily.     benzonatate  (TESSALON  PERLES) 100 MG capsule Take 1 capsule (100 mg total) by mouth 3 (three) times daily as needed for cough. 20 capsule 0   BOTOX 200 units injection Once every 4 months     buPROPion  (WELLBUTRIN  XL) 150 MG 24 hr tablet Take 1 tablet (150 mg total) by mouth daily. 90 tablet 3   CALCIUM PO Take 2 tablets by mouth daily.     cephALEXin  (KEFLEX ) 500 MG capsule Take 1 capsule (500 mg total) by mouth 3 (three) times daily for 7 days. 21 capsule 0   cetirizine (ZYRTEC) 10 MG tablet Take 10 mg by mouth daily.     Cyanocobalamin  1000 MCG/ML KIT Inject 1,000 mcg every 30 (thirty) days as directed.     DULoxetine  (CYMBALTA ) 20 MG capsule Take 1 capsule (20 mg total) by mouth daily. 30 capsule 0   eletriptan  (RELPAX ) 40 MG tablet Take 1 tablet (40 mg total) by mouth every 2 (two) hours as needed  for migraine or headache. May repeat in 2 hours if headache persists or recurs. 10 tablet 2   famotidine  (PEPCID ) 20 MG tablet Take 20 mg by mouth daily as needed for heartburn or indigestion.     gabapentin  (NEURONTIN ) 600 MG tablet Take 600 mg by mouth 2 (two) times daily.     ketoconazole  (NIZORAL ) 2 % cream Apply to corner of mouth nightly 15 g 0   levothyroxine  (SYNTHROID , LEVOTHROID) 100 MCG tablet Take 100 mcg daily before breakfast by mouth.     Melatonin 5 MG CAPS Take 5 mg by mouth at bedtime.     Menthol  (BIOFREEZE) 10 % AERO      Omega-3 Fatty Acids (FISH OIL) 1000 MG CAPS 1  capsule     omeprazole  (PRILOSEC) 40 MG capsule Take 1 capsule (40 mg total) by mouth daily. 90 capsule 0   polyethylene glycol (MIRALAX  / GLYCOLAX ) packet Take 17 g by mouth every evening.     propranolol  (INDERAL ) 60 MG tablet Take 1 tablet (60 mg total) by mouth daily. 90 tablet 3   spironolactone  (ALDACTONE ) 25 MG tablet Take 0.5 tablets (12.5 mg total) by mouth daily. 45 tablet 3   Varenicline  Tartrate (TYRVAYA ) 0.03 MG/ACT SOLN Place into the nose.     VITAMIN D  PO Take 2 capsules by mouth daily.     No current facility-administered medications for this visit.     Objective:  BP 96/60   Temp (!) 97 F (36.1 C)   Ht 5' 8 (1.727 m)   Wt 166 lb 6.4 oz (75.5 kg)   BMI 25.30 kg/m  Gen: NAD, resting comfortably CV: Regular heart rate Lungs: CTAB no crackles, wheeze, rhonchi  Ext: Trace to 1+ edema below laceration, minimal edema on the left leg Skin: warm, dry, approximately 8 cm laceration that is well-approximated-around this note 2 to 3 cm of increased erythema below it and 1 to 2 cm above it-this area is very tender there and has increased warmth compared to other surrounding tissues     Assessment and Plan   # [place] primary care-patient typically seen by [place] [facility]-they did not have availability this afternoon and to allow outpatient care and reduced ED utilization she was seen by our office [place] primary care worsening.  She is considered established primary within [facility] health primary care.  She will continue primary care at [facility] primary care and [facility] medicine after this visit at [facility]  # Emergency department follow-up for right lower leg laceration with patient concern for infection S: Patient was seen in the emergency department on January 14, 2023-she sustained a right lower leg laceration when she was cleaning out her closet and the boat fell off a shelf and struck her on the leg.  She noted  persistent bleeding/unable to control hemostasis.  She was still able to walk.  She was given a tetanus shot in the emergency department -Treatment required 20 sutures including combination of simple interrupted and horizontal mattress.  She was instructed for removal in 7 to 10 days  Today she reports,has noted increased swelling starting 2-3 days ago. Has a burning sensation that is increasing and burning into dorsume of foot even though laceration is at the ankle. No fevers thankfully. No chills. Redness around the laceration is mildly expanding. On one edge occasional notes mild amount of push. Has used antiboitic cream daily. Is trying to elevate as best as possible A/P: I  am worried about a local skin infection/cellulitis with redness, warmth and tenderness around the wound that seems to be increasing- treat with antibiotic keflex /cephalexin  3x a day for the next 7 days. Its day 6 today and maybe a little early to remove sutures plus you are so tender today I'm hoping removal will be easier after antibiotics and continued elevation. Schedule a visit on Monday or Tuesday for recheck with your [facility]  -We discussed option of dosing 4 times a day but she thinks this will be challenging so we opted out   Recommended follow up: Monday or Tuesday with her primary care doctor/team Future Appointments  Date Time Provider Department Center  02/03/2023 11:20 AM Cloretta Arley NOVAK, MD [facility] None  02/21/2023  8:20 AM [facility] DEVICE REMOTES [facility]  04/10/2023  1:50 PM Caudle, Thersia Bitters, FNP [facility]  05/23/2023  8:20 AM [facility] DEVICE REMOTES [facility]  08/22/2023  8:20 AM [facility] DEVICE REMOTES [facility]  09/26/2023 11:20 AM [facility] WELLNESS VISIT [facility]  11/21/2023  8:20 AM [facility] DEVICE REMOTES [facility]  02/20/2024  8:20 AM [facility] DEVICE REMOTES [facility]    Lab/Order  associations:   ICD-10-CM   1. Cellulitis of right lower extremity  L03.115       Meds ordered this encounter  Medications   cephALEXin  (KEFLEX ) 500 MG capsule    Sig: Take 1 capsule (500 mg total) by mouth 3 (three) times daily for 7 days.    Dispense:  21 capsule    Refill:  0    Return precautions advised.  Garnette Lukes, MD

## 2023-01-20 NOTE — Telephone Encounter (Signed)
  Chief Complaint: possible wound infection Symptoms: red around stitches, warm to touch, painful, swollen Frequency: constant  Disposition: [] ED /[] Urgent Care (no appt availability in office) / [x] Appointment(In office/virtual)/ []  [place] [facility]/ [] Home Care/ [] Refused Recommended Disposition /[] [place] [facility]/ []  Follow-up with PCP Additional Notes: Pt received 24 stitches on right ankle 01/14/23. Pt was given an abx cream to apply daily. On 1/8, pt noticed increase in swelling, warmness, burning, and pain. Pt stated she's applied cream as ordered and keeps foot elevated, but it's getting worse. Per protocol, pt to see provider today. Pt is being seen at [place] office due to availability. RN gave care advice and pt verbalized understanding.            Copied from CRM [id number]. Topic: Appointments - Red Word Triage >> Jan 20, 2023 12:10 PM Powell HERO wrote: Patient, swelling, burning pain, Reason for Disposition  [1] Looks infected (spreading redness, red streak) AND [2] no fever  Answer Assessment - Initial Assessment Questions 1. LOCATION: Where is the wound located?      Right ankle 2. WOUND APPEARANCE: What does the wound look like?      Read and swollen  3. SIZE: If redness is present, ask: What is the size of the red area? (Inches, centimeters, or compare to size of a coin)      1/2 inches. Stitches look darker 4. SPREAD: What's changed in the last day?  Do you see any red streaks coming from the wound?     Increase in swelling, warm to touch 5. ONSET: When did it start to look infected?      1/8 6. MECHANISM: How did the wound start, what was the cause?     Cut foot 7. PAIN: Do you have any pain?  If Yes, ask: How bad is the pain?  (e.g., Scale 1-10; mild, moderate, or severe)    - MILD (1-3): Doesn't interfere with normal activities.     - MODERATE (4-7): Interferes with normal activities or awakens from sleep.    - SEVERE  (8-10): Excruciating pain, unable to do any normal activities.       5-6 when walking 8. FEVER: Do you have a fever? If Yes, ask: What is your temperature, how was it measured, and when did it start?     denies 9. OTHER SYMPTOMS: Do you have any other symptoms? (e.g., shaking chills, weakness, rash elsewhere on body)     denies  Protocols used: Wound Infection Suspected-A-AH

## 2023-01-27 ENCOUNTER — Encounter (HOSPITAL_BASED_OUTPATIENT_CLINIC_OR_DEPARTMENT_OTHER): Payer: Self-pay | Admitting: Family Medicine

## 2023-01-27 ENCOUNTER — Ambulatory Visit (HOSPITAL_BASED_OUTPATIENT_CLINIC_OR_DEPARTMENT_OTHER): Payer: Medicare PPO | Admitting: Family Medicine

## 2023-01-27 VITALS — BP 107/68 | HR 50 | Ht 68.0 in | Wt 163.4 lb

## 2023-01-27 DIAGNOSIS — L03115 Cellulitis of right lower limb: Secondary | ICD-10-CM | POA: Diagnosis not present

## 2023-01-27 DIAGNOSIS — Z4802 Encounter for removal of sutures: Secondary | ICD-10-CM

## 2023-01-27 MED ORDER — HYDROXYZINE HCL 10 MG PO TABS: 10.0000 mg | ORAL_TABLET | Freq: Every day | ORAL | 0 refills | Status: DC | PRN

## 2023-01-27 MED ORDER — DOXYCYCLINE HYCLATE 100 MG PO TABS: 100.0000 mg | ORAL_TABLET | Freq: Two times a day (BID) | ORAL | 0 refills | Status: DC

## 2023-01-27 NOTE — Patient Instructions (Signed)
Change your dressing at least every 2 days. You can air it out at times. If drainage, redness or pain is worsening, call the office.

## 2023-01-27 NOTE — Progress Notes (Signed)
Acute Care Office Visit  Subjective:   [name] 06-19-1955 01/27/2023  Chief Complaint  Patient presents with   Suture / Staple Removal    Patient is here to get sutures removed from her leg after she had a laceration that had to be repaired. States that the area has been itching.    HPI:  Patient is here for removal of sutures from closed wound. Patient provided verbal consent for removal of sutures. Wound was obtained on January 13, 2022 from right lower leg laceration.  Patient was cleaning out a closet in a book fell off a shelf and struck her in the right leg.  She is on anticoagulation with Eliquis due to A-fib and reported thin skin that is easily prone to tearing.  She went to the ER on January 13, 2022 and had laceration repair.  Per chart review, patient had vessel tie off with a 5-0 absorbable suture and had 20 simple interrupted and horizontal mattress sutures placed with 4-0 Prolene.  Patient did end up at primary care office on January 10 due to swelling, redness to repaired laceration with burning and pain.  He was started on Keflex 3 times a day for 7 days for possible cellulitis.  Patient states today that redness and swelling has improved, but the itching from the sutures and wound site is keeping her up at nighttime.  She denies fevers or chills, denies drainage or bleeding denies fever, chills, drainage, bleeding   The following portions of the patient's history were reviewed and updated as appropriate: past medical history, past surgical history, family history, social history, allergies, medications, and problem list.   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   Rosacea 11/05/2019   Scar 11/05/2019   Biventricular cardiac pacemaker in situ 10/08/2018   Long term current use of anticoagulant therapy 10/23/2017   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   Trigeminal neuralgia 12/14/2012   Insomnia, persistent 11/03/2011   AICD (automatic cardioverter/defibrillator) present    Aortic regurgitation    Nonischemic cardiomyopathy (HCC) 01/21/2011   Esophagitis, reflux 08/24/2010   Idiopathic peripheral neuropathy 08/24/2010   Chronic systolic CHF (congestive heart failure) (HCC) 02/24/2010   Paroxysmal atrial fibrillation (HCC) 02/23/2010   Hypothyroidism    History of migraine headaches    Left bundle branch block    Idiopathic scoliosis and kyphoscoliosis     Class: Chronic   Lumbar canal stenosis 12/25/2009   Anxiety state 07/24/2008   Avitaminosis D 03/21/2008   Atypical migraine 01/30/2008   Benign essential HTN 01/30/2008   Paroxysmal digital cyanosis 02/15/2007   Past Medical History:  Diagnosis Date   AF (paroxysmal atrial fibrillation) (HCC)    Arthritis    BCC (basal cell carcinoma) 03/11/2003   left distal lower leg ankle-tx dr Danella Deis   Biventricular cardiac pacemaker in situ    a. prior CRT-D in 2013 with downgrade to CRT-Pacemaker only in 11/2016.   Breast cancer (HCC) 12/12/2019   Chronic systolic CHF (congestive heart failure) (HCC)    Complication of anesthesia    aspiration   Essential tremor  GERD (gastroesophageal reflux disease)    otc   Hypertension    Hypothyroidism    LBBB (left bundle branch block)    conduction disorder   Migraines    Mild aortic insufficiency    NICM (nonischemic cardiomyopathy) (HCC)    a. 2012: normal coronaries, EF 25-30%. b. improved to 55% 11/2016.   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   in situ- right lower leg-anterior ([id number])   Scoliosis    Past Surgical History:  Procedure Laterality Date   ARTERY BIOPSY  12/29/2011   Procedure: MINOR BIOPSY TEMPORAL ARTERY;  Surgeon: Mariella Saa, MD;  Location: [facility];  Service: General;  Laterality: Right;  Right temporal artery biopsy   BACK SURGERY  12/31/2009   "for flat back syndrome; fused bottom of my back"   BI-VENTRICULAR IMPLANTABLE CARDIOVERTER DEFIBRILLATOR Left 01/20/2011   Procedure: BI-VENTRICULAR IMPLANTABLE CARDIOVERTER DEFIBRILLATOR  (CRT-D);  Surgeon: Marinus Maw, MD;  Location[facility] LAB;  Service: Cardiovascular;  Laterality: Left;   BIV ICD GENERATOR CHANGEOUT N/A 11/24/2016   Procedure: BIV ICD GENERATOR CHANGEOUT;  Surgeon: Marinus Maw, MD;  Location: [facility] INVASIVE CV LAB;  Service: Cardiovascular;  Laterality: N/A;   BREAST LUMPECTOMY WITH RADIOACTIVE SEED LOCALIZATION Left 01/22/2020   Procedure: LEFT BREAST LUMPECTOMY WITH RADIOACTIVE SEED LOCALIZATION;  Surgeon: Manus Rudd, MD;  Location: [facility];  Service: General;  Laterality: Left;  LMA   CHOLECYSTECTOMY  ~1996   FOOT NEUROMA SURGERY  ~ 2003   right   RE-EXCISION OF BREAST LUMPECTOMY Left 03/03/2020   Procedure: RE-EXCISION MARGINS OF LEFT BREAST LUMPECTOMY;  Surgeon: Manus Rudd, MD;  Location: [facility];  Service: General;  Laterality: Left;   ROTATOR CUFF REPAIR  ~ 2009   left   SHOULDER ARTHROSCOPY W/ ROTATOR CUFF REPAIR Right 09/21/2011   SKIN CANCER EXCISION     nose, forehead, left leg   TEE WITHOUT CARDIOVERSION N/A 03/01/2017   Procedure: TRANSESOPHAGEAL ECHOCARDIOGRAM (TEE);  Surgeon: Chilton Si, MD;  Location: [facility] ENDOSCOPY;  Service: Cardiovascular;  Laterality: N/A;   THYROID LOBECTOMY  ~ 2006   right   TUBAL LIGATION  ~ 1983    VAGINAL HYSTERECTOMY  ~ 2009   VESICOVAGINAL FISTULA CLOSURE W/ TAH     Family History  Problem Relation Age of Onset   Heart disease Mother    Heart failure Mother 42       CHF   Stroke Mother    Hypertension Mother    Heart disease Father 84   Macular degeneration Father    Heart disease Sister    Atrial fibrillation Sister    Heart disease Sister    Outpatient Medications Prior to Visit  Medication Sig Dispense Refill   acetaminophen (TYLENOL) 325 MG tablet Take 650 mg by mouth as needed.     albuterol (VENTOLIN HFA) 108 (90 Base) MCG/ACT inhaler Inhale 2 puffs into the lungs every 6 (six) hours as needed for wheezing or shortness of breath. 8 g 2   ALPRAZolam (XANAX) 0.5 MG tablet Take 0.5 mg by mouth 3 (three) times daily as needed for anxiety.     anastrozole (ARIMIDEX) 1 MG tablet Take 1 tablet (1 mg total) by mouth daily. 90  tablet 0   apixaban (ELIQUIS) 5 MG TABS tablet Take 1 tablet (5 mg total) by mouth 2 (two) times daily. 180 tablet 1   Azelaic Acid 15 % gel Apply 1 Application topically 2 (two) times daily.     BOTOX 200 units injection Once every 4 months     buPROPion (WELLBUTRIN XL) 150 MG 24 hr tablet Take 1 tablet (150 mg total) by mouth daily. 90 tablet 3   CALCIUM PO Take 2 tablets by mouth daily.     cetirizine (ZYRTEC) 10 MG tablet Take 10 mg by mouth daily.     eletriptan (RELPAX) 40 MG tablet Take 1 tablet (40 mg total) by mouth every 2 (two) hours as needed for migraine or headache. May repeat in 2 hours if headache persists or recurs. 10 tablet 2   famotidine (PEPCID) 20 MG tablet Take 20 mg by mouth daily as needed for heartburn or indigestion.     gabapentin (NEURONTIN) 600 MG tablet Take 600 mg by mouth 2 (two) times daily.     levothyroxine (SYNTHROID, LEVOTHROID) 100 MCG tablet Take 100 mcg daily before breakfast by mouth.     Melatonin 5 MG CAPS Take 5 mg by mouth at bedtime.     Menthol (BIOFREEZE) 10 % AERO      Omega-3 Fatty Acids (FISH OIL)  1000 MG CAPS 1 capsule     omeprazole (PRILOSEC) 40 MG capsule Take 1 capsule (40 mg total) by mouth daily. 90 capsule 0   polyethylene glycol (MIRALAX / GLYCOLAX) packet Take 17 g by mouth every evening.     propranolol (INDERAL) 60 MG tablet Take 1 tablet (60 mg total) by mouth daily. 90 tablet 3   spironolactone (ALDACTONE) 25 MG tablet Take 0.5 tablets (12.5 mg total) by mouth daily. 45 tablet 3   Varenicline Tartrate (TYRVAYA) 0.03 MG/ACT SOLN Place into the nose.     VITAMIN D PO Take 2 capsules by mouth daily.     Cyanocobalamin 1000 MCG/ML KIT Inject 1,000 mcg every 30 (thirty) days as directed. (Patient not taking: Reported on 01/27/2023)     cephALEXin (KEFLEX) 500 MG capsule Take 1 capsule (500 mg total) by mouth 3 (three) times daily for 7 days. 21 capsule 0   DULoxetine (CYMBALTA) 20 MG capsule Take 1 capsule (20 mg total) by mouth daily. 30 capsule 0   ketoconazole (NIZORAL) 2 % cream Apply to corner of mouth nightly 15 g 0   No facility-administered medications prior to visit.   Allergies  Allergen Reactions   Other Nausea And Vomiting and Other (See Comments)    Most narcotic pain meds cause N/V and CHEST PAIN; needs an antiemetic to tolerate  Also, patient was diagnosed with Raynaud's disease and was told to NOT place IV(s) in either hand because of poor circulation   Iodine Other (See Comments)    "Blisters"   Tape Other (See Comments)    PATIENT'S SKIN IS VERY THIN; IT TEARS, BRUISES, AND BLEEDS VERY EASILY (PLEASE USE COBAN WRAP, IF POSSIBLE!!)   Codeine Other (See Comments)    Chest pain   Fentanyl Other (See Comments)    Bad dreams   Morphine And Codeine Nausea And Vomiting     ROS: A complete ROS was performed with pertinent positives/negatives noted in the HPI. The remainder of the ROS are negative.    Objective:   Today's Vitals   01/27/23 1115 01/27/23 1201  BP: (!) 106/43 107/68  Pulse: (!) 50  SpO2: 100%   Weight: 163 lb 6.4 oz (74.1 kg)   Height:  5\' 8"  (1.727 m)     GENERAL: Well-appearing, in NAD. Well nourished.  SKIN: Pink, warm and dry.  Approximately 8 cm laceration with sutures present that is well-approximated, no drainage or erythema to wound or periwound.  Site is closed, skin is warm and dry.   Head: Normocephalic. NECK: Trachea midline. Full ROM w/o pain or tenderness.  RESPIRATORY: Chest wall symmetrical. Respirations even and non-labored.  MSK: Muscle tone and strength appropriate for age. Joints w/o tenderness, redness, or swelling.  EXTREMITIES: Without clubbing, cyanosis,.There is mild erythema present to right foot with mild warmth.  +2 pedal pulses bilaterally NEUROLOGIC: No motor or sensory deficits. Steady, even gait. C2-C12 intact.  PSYCH/MENTAL STATUS: Alert, oriented x 3. Cooperative, appropriate mood and affect.   Suture Removal  Date/Time: 01/27/2023 12:50 PM  Performed by: Hilbert Bible, FNP Authorized by: Hilbert Bible, FNP  Body area: lower extremity Location details: right lower leg Wound Appearance: clean Sutures Removed: 20 Post-removal: dressing applied and Steri-Strips applied Patient tolerance: patient tolerated the procedure well with no immediate complications       Assessment & Plan:  1. Cellulitis of right lower extremity (Primary) Per chart review and per patient, signs of infection have improved following Keflex course.  Patient will be switched to doxycycline 100 mg twice daily for the next 7 days.  Discussed wound prevention and would like patient to follow-up in approximately 7 days or sooner if needed if no improvement or worsening.  Patient verbalized understanding  2. Visit for suture removal Patient tolerated suture removal well.  Steri-Strips and Vaseline gauze placed over wound to promote healing.  Teflon and paper tape placed over Steri-Strips and Vaseline gauze.  Wound care discussed and demonstrated to patient.  She verbalized understanding.  Will send in  hydroxyzine 10 mg to use only at nighttime as needed for itching.  Discussed safe use and possible side effects of this medication and patient verbalized understanding - Suture Removal   Meds ordered this encounter  Medications   hydrOXYzine (ATARAX) 10 MG tablet    Sig: Take 1 tablet (10 mg total) by mouth daily as needed for itching.    Dispense:  30 tablet    Refill:  0    Supervising Provider:   DE [place], RAYMOND J [[id number]]   doxycycline (VIBRA-TABS) 100 MG tablet    Sig: Take 1 tablet (100 mg total) by mouth 2 (two) times daily.    Dispense:  14 tablet    Refill:  0    Supervising Provider:   DE [place], RAYMOND J [[id number]]   Return in about 1 week (around 02/03/2023) for Wound Check Up .    Patient to reach out to office if new, worrisome, or unresolved symptoms arise or if no improvement in patient's condition. Patient verbalized understanding and is agreeable to treatment plan. All questions answered to patient's satisfaction.    Hilbert Bible, [place]

## 2023-01-30 ENCOUNTER — Encounter (HOSPITAL_BASED_OUTPATIENT_CLINIC_OR_DEPARTMENT_OTHER): Payer: Self-pay | Admitting: Family Medicine

## 2023-01-30 ENCOUNTER — Ambulatory Visit (HOSPITAL_BASED_OUTPATIENT_CLINIC_OR_DEPARTMENT_OTHER): Payer: Medicare PPO | Admitting: Family Medicine

## 2023-01-30 VITALS — BP 119/43 | HR 70 | Temp 97.3°F | Ht 68.0 in | Wt 163.7 lb

## 2023-01-30 DIAGNOSIS — L03115 Cellulitis of right lower limb: Secondary | ICD-10-CM | POA: Diagnosis not present

## 2023-01-30 DIAGNOSIS — T148XXA Other injury of unspecified body region, initial encounter: Secondary | ICD-10-CM | POA: Diagnosis not present

## 2023-01-30 NOTE — Progress Notes (Signed)
Acute Care Office Visit  Subjective:   [name] 08/21/1955 01/30/2023  Chief Complaint  Patient presents with   Bleeding/Bruising    Patient has had some bleeding after suture removal 3 days ago. Patient states her leg began to bleed overnight and states she put the powder on her leg to help stop the bleeding and then and also has her leg wrapped up.    HPI: WOUND CONCERN:  Patient seen on 01/26/2022 for suture removal from right lower extremity and evaluation of ongoing cellulitis to right lower extremity. Pt tolerated suture removal well and was started on Doxycycline 100mg  BID x 7 days and given Atarax for itching. Pt states redness, itching, and swelling has improved. However, she noticed bleeding from the wound starting last night. Pt is on anticoagulant. Reports her husband is assisting with her wound care and has been using Vaseline gauze and non adherent bandage to cover wound and use of Tegaderm as needed to keep dressing in place.    The following portions of the patient's history were reviewed and updated as appropriate: past medical history, past surgical history, family history, social history, allergies, medications, and problem list.   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   Trigeminal neuralgia 12/14/2012   Insomnia, persistent 11/03/2011   AICD (automatic cardioverter/defibrillator) present    Aortic  regurgitation    Nonischemic cardiomyopathy (HCC) 01/21/2011   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/17/2010   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   Esophagitis, reflux 08/24/2010   Idiopathic peripheral neuropathy 08/24/2010   Chronic systolic CHF (congestive heart failure) (HCC) 02/24/2010   Paroxysmal atrial fibrillation (HCC) 02/23/2010   Hypothyroidism    History of migraine headaches    Left bundle branch block    Idiopathic scoliosis and kyphoscoliosis     Class: Chronic   Lumbar canal stenosis 12/25/2009   Menopause 04/15/2009   Anxiety state 07/24/2008   Avitaminosis D 03/21/2008   Atypical migraine 01/30/2008   Benign essential HTN 01/30/2008   Chronic migraine w/o aura w/o status migrainosus, not intractable 01/30/2008   Paroxysmal digital cyanosis 02/15/2007   Raynaud's phenomenon 02/15/2007   Past Medical History:  Diagnosis Date   AF (paroxysmal atrial fibrillation) (HCC)    Arthritis    BCC (basal cell carcinoma) 03/11/2003   left distal lower leg ankle-tx dr Danella Deis   Biventricular cardiac pacemaker in situ    a. prior CRT-D in 2013 with downgrade to CRT-Pacemaker only in 11/2016.   Breast cancer (HCC) 12/12/2019   Chronic systolic CHF (congestive heart failure) (HCC)    Complication of anesthesia    aspiration   Essential tremor    GERD (gastroesophageal reflux disease)    otc   Hypertension    Hypothyroidism    LBBB (left bundle branch block)    conduction disorder   Migraines    Mild aortic insufficiency    NICM (nonischemic cardiomyopathy) (HCC)    a.  2012: normal coronaries, EF 25-30%. b. improved to 55% 11/2016.   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   in situ- right lower leg-anterior ([id number])    Scoliosis    Past Surgical History:  Procedure Laterality Date   ARTERY BIOPSY  12/29/2011   Procedure: MINOR BIOPSY TEMPORAL ARTERY;  Surgeon: Mariella Saa, MD;  Location: [facility];  Service: General;  Laterality: Right;  Right temporal artery biopsy   BACK SURGERY  12/31/2009   "for flat back syndrome; fused bottom of my back"   BI-VENTRICULAR IMPLANTABLE CARDIOVERTER DEFIBRILLATOR Left 01/20/2011   Procedure: BI-VENTRICULAR IMPLANTABLE CARDIOVERTER DEFIBRILLATOR  (CRT-D);  Surgeon: Marinus Maw, MD;  Location: [facility] CATH LAB;  Service: Cardiovascular;  Laterality: Left;   BIV ICD GENERATOR CHANGEOUT N/A 11/24/2016   Procedure: BIV ICD GENERATOR CHANGEOUT;  Surgeon: Marinus Maw, MD;  Location: [facility] INVASIVE CV LAB;  Service: Cardiovascular;  Laterality: N/A;   BREAST LUMPECTOMY WITH RADIOACTIVE SEED LOCALIZATION Left 01/22/2020   Procedure: LEFT BREAST LUMPECTOMY WITH RADIOACTIVE SEED LOCALIZATION;  Surgeon: Manus Rudd, MD;  Location: [facility];  Service: General;  Laterality: Left;  LMA   CHOLECYSTECTOMY  ~1996   FOOT NEUROMA SURGERY  ~ 2003   right   RE-EXCISION OF BREAST LUMPECTOMY Left 03/03/2020   Procedure: RE-EXCISION MARGINS OF LEFT BREAST LUMPECTOMY;  Surgeon: Manus Rudd, MD;  Location:  SURGERY CENTER;  Service: General;  Laterality: Left;   ROTATOR CUFF REPAIR  ~ 2009   left   SHOULDER ARTHROSCOPY W/ ROTATOR CUFF REPAIR Right 09/21/2011   SKIN CANCER EXCISION     nose, forehead, left leg   TEE WITHOUT CARDIOVERSION N/A 03/01/2017   Procedure: TRANSESOPHAGEAL ECHOCARDIOGRAM (TEE);  Surgeon: Chilton Si, MD;  Location: [facility] ENDOSCOPY;  Service: Cardiovascular;  Laterality: N/A;   THYROID LOBECTOMY  ~ 2006   right   TUBAL LIGATION  ~ 1983   VAGINAL HYSTERECTOMY  ~ 2009   VESICOVAGINAL FISTULA CLOSURE W/ TAH     Family History  Problem Relation Age of Onset   Heart disease Mother    Heart failure Mother 67        CHF   Stroke Mother    Hypertension Mother    Heart disease Father 38   Macular degeneration Father    Heart disease Sister    Atrial fibrillation Sister    Heart disease Sister    Outpatient Medications Prior to Visit  Medication Sig Dispense Refill   acetaminophen (TYLENOL) 325 MG tablet Take 650 mg by mouth as needed.     albuterol (VENTOLIN HFA) 108 (90 Base) MCG/ACT inhaler Inhale 2 puffs into the lungs every 6 (six) hours as needed for wheezing or shortness of breath. 8 g 2   ALPRAZolam (XANAX) 0.5 MG tablet Take 0.5 mg by mouth 3 (three) times daily as needed for anxiety.     anastrozole (ARIMIDEX) 1 MG tablet Take 1 tablet (1 mg total) by mouth daily. 90 tablet 0   apixaban (ELIQUIS) 5 MG TABS tablet Take 1 tablet (5 mg total) by mouth 2 (two) times daily. 180 tablet 1   Azelaic Acid 15 % gel Apply 1 Application topically 2 (two) times daily.     BOTOX 200 units injection  Once every 4 months     buPROPion (WELLBUTRIN XL) 150 MG 24 hr tablet Take 1 tablet (150 mg total) by mouth daily. 90 tablet 3   CALCIUM PO Take 2 tablets by mouth daily.     cetirizine (ZYRTEC) 10 MG tablet Take 10 mg by mouth daily.     Cyanocobalamin 1000 MCG/ML KIT Inject 1,000 mcg as directed every 30 (thirty) days.     doxycycline (VIBRA-TABS) 100 MG tablet Take 1 tablet (100 mg total) by mouth 2 (two) times daily. 14 tablet 0   eletriptan (RELPAX) 40 MG tablet Take 1 tablet (40 mg total) by mouth every 2 (two) hours as needed for migraine or headache. May repeat in 2 hours if headache persists or recurs. 10 tablet 2   famotidine (PEPCID) 20 MG tablet Take 20 mg by mouth daily as needed for heartburn or indigestion.     gabapentin (NEURONTIN) 600 MG tablet Take 600 mg by mouth 2 (two) times daily.     hydrOXYzine (ATARAX) 10 MG tablet Take 1 tablet (10 mg total) by mouth daily as needed for itching. 30 tablet 0   levothyroxine (SYNTHROID, LEVOTHROID) 100 MCG tablet Take 100 mcg daily before breakfast  by mouth.     Melatonin 5 MG CAPS Take 5 mg by mouth at bedtime.     Menthol (BIOFREEZE) 10 % AERO      Omega-3 Fatty Acids (FISH OIL) 1000 MG CAPS 1 capsule     omeprazole (PRILOSEC) 40 MG capsule Take 1 capsule (40 mg total) by mouth daily. 90 capsule 0   polyethylene glycol (MIRALAX / GLYCOLAX) packet Take 17 g by mouth every evening.     propranolol (INDERAL) 60 MG tablet Take 1 tablet (60 mg total) by mouth daily. 90 tablet 3   spironolactone (ALDACTONE) 25 MG tablet Take 0.5 tablets (12.5 mg total) by mouth daily. 45 tablet 3   Varenicline Tartrate (TYRVAYA) 0.03 MG/ACT SOLN Place into the nose.     VITAMIN D PO Take 2 capsules by mouth daily.     No facility-administered medications prior to visit.   Allergies  Allergen Reactions   Other Nausea And Vomiting and Other (See Comments)    Most narcotic pain meds cause N/V and CHEST PAIN; needs an antiemetic to tolerate  Also, patient was diagnosed with Raynaud's disease and was told to NOT place IV(s) in either hand because of poor circulation   Iodine Other (See Comments)    "Blisters"   Tape Other (See Comments)    PATIENT'S SKIN IS VERY THIN; IT TEARS, BRUISES, AND BLEEDS VERY EASILY (PLEASE USE COBAN WRAP, IF POSSIBLE!!)   Codeine Other (See Comments)    Chest pain   Fentanyl Other (See Comments)    Bad dreams   Morphine And Codeine Nausea And Vomiting     ROS: A complete ROS was performed with pertinent positives/negatives noted in the HPI. The remainder of the ROS are negative.    Objective:   Today's Vitals   01/30/23 0923  BP: (!) 119/43  Pulse: 70  Temp: (!) 97.3 F (36.3 C)  TempSrc: Oral  SpO2: 100%  Weight: 163 lb 11.2 oz (74.3 kg)  Height: 5\' 8"  (1.727 m)    GENERAL: Well-appearing, in NAD. Well nourished.  SKIN: Pink, warm and dry. Approximately 8 cm laceration present to right lower extremity without dehiscence. Small amount of dried blood present. No active bleeding.  No drainage or erythema to wound  or periwound. Site is  closed, skin is warm and dry.  Head: Normocephalic. NECK: Trachea midline. Full ROM w/o pain or tenderness.  RESPIRATORY: Chest wall symmetrical. Respirations even and non-labored. MSK: Muscle tone and strength appropriate for age.  EXTREMITIES: Without clubbing, cyanosis. Mild +1 non pitting edema to Right foot. Redness and streaking has resolved.  NEUROLOGIC: No motor or sensory deficits. Steady, even gait. C2-C12 intact.  PSYCH/MENTAL STATUS: Alert, oriented x 3. Cooperative, appropriate mood and affect.     Wound Care:  Old dressing removed using sterile saline solution. Site cleaned and dried with sterile saline. No active bleeding present. Steri strips applied along length of wound for further stability to wound and Telfa dressing applied.   Assessment & Plan:  1. Wound drainage (Primary) Flexion of right foot and anticoagulant frequently likely is causing trauma to wound of right lower extremity. Discussed rest of RLE, decreasing flexion, and elevation of RLE. Steristrips to be changed in 24-48 hours or sooner if drainage occurs. Follow up in 4 days for re-evaluation.   2. Cellulitis of right lower extremity Improving. Continue on Doxycycline and finish abx course. Follow up on Friday or sooner if needed.   Return for As scheduled on Friday for wound follow up .    Patient to reach out to office if new, worrisome, or unresolved symptoms arise or if no improvement in patient's condition. Patient verbalized understanding and is agreeable to treatment plan. All questions answered to patient's satisfaction.    Hilbert Bible, [place]

## 2023-02-03 ENCOUNTER — Ambulatory Visit (HOSPITAL_BASED_OUTPATIENT_CLINIC_OR_DEPARTMENT_OTHER): Payer: Medicare PPO | Admitting: Family Medicine

## 2023-02-03 ENCOUNTER — Other Ambulatory Visit: Payer: Self-pay | Admitting: *Deleted

## 2023-02-03 ENCOUNTER — Encounter (HOSPITAL_BASED_OUTPATIENT_CLINIC_OR_DEPARTMENT_OTHER): Payer: Self-pay | Admitting: Family Medicine

## 2023-02-03 ENCOUNTER — Inpatient Hospital Stay: Payer: Medicare PPO | Attending: Oncology | Admitting: Oncology

## 2023-02-03 ENCOUNTER — Encounter: Payer: Self-pay | Admitting: *Deleted

## 2023-02-03 VITALS — BP 96/58 | HR 59 | Temp 98.1°F | Resp 18 | Ht 67.0 in | Wt 163.0 lb

## 2023-02-03 VITALS — BP 91/58 | HR 57 | Ht 67.0 in | Wt 163.3 lb

## 2023-02-03 DIAGNOSIS — Z79811 Long term (current) use of aromatase inhibitors: Secondary | ICD-10-CM | POA: Diagnosis not present

## 2023-02-03 DIAGNOSIS — E039 Hypothyroidism, unspecified: Secondary | ICD-10-CM | POA: Diagnosis not present

## 2023-02-03 DIAGNOSIS — M810 Age-related osteoporosis without current pathological fracture: Secondary | ICD-10-CM

## 2023-02-03 DIAGNOSIS — D0512 Intraductal carcinoma in situ of left breast: Secondary | ICD-10-CM | POA: Diagnosis not present

## 2023-02-03 DIAGNOSIS — S8991XS Unspecified injury of right lower leg, sequela: Secondary | ICD-10-CM | POA: Diagnosis not present

## 2023-02-03 DIAGNOSIS — G629 Polyneuropathy, unspecified: Secondary | ICD-10-CM | POA: Diagnosis not present

## 2023-02-03 DIAGNOSIS — I480 Paroxysmal atrial fibrillation: Secondary | ICD-10-CM | POA: Insufficient documentation

## 2023-02-03 DIAGNOSIS — I509 Heart failure, unspecified: Secondary | ICD-10-CM | POA: Diagnosis not present

## 2023-02-03 DIAGNOSIS — T148XXD Other injury of unspecified body region, subsequent encounter: Secondary | ICD-10-CM | POA: Diagnosis not present

## 2023-02-03 DIAGNOSIS — B3731 Acute candidiasis of vulva and vagina: Secondary | ICD-10-CM | POA: Diagnosis not present

## 2023-02-03 DIAGNOSIS — L03115 Cellulitis of right lower limb: Secondary | ICD-10-CM | POA: Insufficient documentation

## 2023-02-03 MED ORDER — MUPIROCIN 2 % EX OINT: TOPICAL_OINTMENT | CUTANEOUS | 3 refills | Status: DC

## 2023-02-03 MED ORDER — NYSTATIN-TRIAMCINOLONE 100000-0.1 UNIT/GM-% EX OINT: TOPICAL_OINTMENT | CUTANEOUS | 2 refills | Status: DC

## 2023-02-03 NOTE — Progress Notes (Signed)
  [facility] OFFICE PROGRESS NOTE   Diagnosis: DCIS  INTERVAL HISTORY:   [name] returns as scheduled.  She continues anastrozole.  No hot flashes.  She has chronic left neck pain and is followed by neurology.  No diffuse joint symptoms.  No palpable change over either breast.  A mammogram at [facility] last month was negative.  She last received Zometa 09/01/2022.  No tooth or jaw pain.  She sees a Education officer, community regularly.  She recently suffered a laceration to the right foot.  She is followed by orthopedics.  Objective:  Vital signs in last 24 hours:  Blood pressure (!) 96/58, pulse (!) 59, temperature 98.1 F (36.7 C), temperature source Temporal, resp. rate 18, height 5\' 7"  (1.702 m), weight 163 lb (73.9 kg), SpO2 97%.    Lymphatics: No cervical, supraclavicular, or axillary nodes Resp: Lungs clear bilaterally Cardio: Regular rate and rhythm GI: No hepatosplenomegaly Vascular: No leg edema Breast: Left lumpectomy.  No evidence for local tumor recurrence.  No mass in either breast.  Inverted right nipple (chronic (  Lab Results:  Lab Results  Component Value Date   WBC 9.6 01/09/2023   HGB 14.0 01/09/2023   HCT 43.1 01/09/2023   MCV 92 01/09/2023   PLT 356 01/09/2023   NEUTROABS 5.6 01/09/2023    CMP  Lab Results  Component Value Date   NA 144 12/05/2022   K 4.4 12/05/2022   CL 105 12/05/2022   CO2 26 12/05/2022   GLUCOSE 77 12/05/2022   BUN 22 12/05/2022   CREATININE 0.97 12/05/2022   CALCIUM 9.2 12/05/2022   PROT 7.0 02/04/2021   ALBUMIN 4.0 02/04/2021   AST 28 02/04/2021   ALT 25 02/04/2021   ALKPHOS 78 02/04/2021   BILITOT 0.4 02/04/2021   GFRNONAA >60 09/01/2022   GFRAA >60 02/27/2017    No results found for: "CEA1", "CEA", "WUJ811", "CA125"  Lab Results  Component Value Date   INR 0.9 01/17/2011   LABPROT 10.3 01/17/2011    Imaging:  No results found.  Medications: I have reviewed the patient's current  medications.   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  Paroxysmal atrial fibrillation-apixaban 3.  History of a splenic infarct 4.  History of migraine headaches 5.  "Abnormal Cologuard "-scheduled for a colonoscopy at [facility] gastroenterology 6.  Neuropathy 7.  CHF 8.  Hypothyroidism 9.  Osteoporosis    Disposition: [name] is in clinical remission from breast cancer.  She continues anastrozole.  She will continue yearly mammography.  She has osteoporosis.  She will continue Zometa every 6 months.  A bone density scan was ordered by her primary provider last fall.  We will try to get the bone density scan scheduled.  [name] will return for an office visit in 1 year.  The plan is to continue anastrozole for 5 years.  Thornton Papas, MD  02/03/2023  1:30 PM

## 2023-02-03 NOTE — Progress Notes (Signed)
Order for bone density placed since patient is requesting to go to [facility] for imaging. Sent MyChart message with phone # to call and schedule.

## 2023-02-03 NOTE — Progress Notes (Signed)
Subjective:   [name] August 26, 1955 02/03/2023  Chief Complaint  Patient presents with   follow -up    Pt. Here for a follow-up on Rt. leg wound.     HPI: [name] presents today for re-assessment of traumatic right leg wound. Patient was previously seen on 01/27/23 for removal of sutures present to right lower leg anterior surface after a laceration repair with vascular repair in the ED on 01/14/23. She was treated for cellulitis to the wound and right lower leg as well with Doxycycline 100mg  BID x 7 days and placement of steri strips by PCP. Pt has been trying to rest lower extremity as wound began to bleed on 01/30/23 due to chronic flexion of right ankle with ambulation. Pt is also on anticoagulants.   Today patient denies recurrent bleeding. Her husband has been changing steri-strips when saturated and has been having small amount of clear-yellow drainage. She denies swelling, redness, or pain. She did have to stop Doxycycline after several days of treatment due to vaginal itching.    The following portions of the patient's history were reviewed and updated as appropriate: past medical history, past surgical history, family history, social history, allergies, medications, and problem list.   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   Trigeminal neuralgia 12/14/2012   Insomnia, persistent 11/03/2011   AICD (automatic  cardioverter/defibrillator) present    Aortic regurgitation    Nonischemic cardiomyopathy (HCC) 01/21/2011   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/17/2010   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   Esophagitis, reflux 08/24/2010   Idiopathic peripheral neuropathy 08/24/2010   Chronic systolic CHF (congestive heart failure) (HCC) 02/24/2010   Paroxysmal atrial fibrillation (HCC) 02/23/2010   Hypothyroidism    History of migraine headaches    Left bundle branch block    Idiopathic scoliosis and kyphoscoliosis     Class: Chronic   Lumbar canal stenosis 12/25/2009   Menopause 04/15/2009   Anxiety state 07/24/2008   Avitaminosis D 03/21/2008   Atypical migraine 01/30/2008   Benign essential HTN 01/30/2008   Chronic migraine w/o aura w/o status migrainosus, not intractable 01/30/2008   Paroxysmal digital cyanosis 02/15/2007   Raynaud's phenomenon 02/15/2007   Past Medical History:  Diagnosis Date   AF (paroxysmal atrial fibrillation) (HCC)    Arthritis    BCC (basal cell carcinoma) 03/11/2003   left distal lower leg ankle-tx dr Danella Deis   Biventricular cardiac pacemaker in situ    a. prior CRT-D in 2013 with downgrade to CRT-Pacemaker only in 11/2016.   Breast cancer (HCC) 12/12/2019   Chronic systolic CHF (congestive heart failure) (HCC)    Complication of anesthesia    aspiration   Essential tremor    GERD (gastroesophageal reflux disease)    otc   Hypertension    Hypothyroidism    LBBB (left bundle branch block)    conduction disorder   Migraines    Mild aortic insufficiency  NICM (nonischemic cardiomyopathy) (HCC)    a. 2012: normal coronaries, EF 25-30%. b. improved to 55% 11/2016.   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    in situ- right lower leg-anterior (CX35FU)   Scoliosis    Past Surgical History:  Procedure Laterality Date   ARTERY BIOPSY  12/29/2011   Procedure: MINOR BIOPSY TEMPORAL ARTERY;  Surgeon: Mariella Saa, MD;  Location: [facility];  Service: General;  Laterality: Right;  Right temporal artery biopsy   BACK SURGERY  12/31/2009   "for flat back syndrome; fused bottom of my back"   BI-VENTRICULAR IMPLANTABLE CARDIOVERTER DEFIBRILLATOR Left 01/20/2011   Procedure: BI-VENTRICULAR IMPLANTABLE CARDIOVERTER DEFIBRILLATOR  (CRT-D);  Surgeon: Marinus Maw, MD;  Location: [facility] C[facility] LAB;  Service: Cardiovascular;  Laterality: Left;   BIV ICD GENERATOR CHANGEOUT N/A 11/24/2016   Procedure: BIV ICD GENERATOR CHANGEOUT;  Surgeon: Marinus Maw, MD;  Location: [facility] INVASIVE CV LAB;  Service: Cardiovascular;  Laterality: N/A;   BREAST LUMPECTOMY WITH RADIOACTIVE SEED LOCALIZATION Left 01/22/2020   Procedure: LEFT BREAST LUMPECTOMY WITH RADIOACTIVE SEED LOCALIZATION;  Surgeon: Manus Rudd, MD;  Location: [facility];  Service: General;  Laterality: Left;  LMA   CHOLECYSTECTOMY  ~1996   FOOT NEUROMA SURGERY  ~ 2003   right   RE-EXCISION OF BREAST LUMPECTOMY Left 03/03/2020   Procedure: RE-EXCISION MARGINS OF LEFT BREAST LUMPECTOMY;  Surgeon: Manus Rudd, MD;  Location: [facility];  Service: General;  Laterality: Left;   ROTATOR CUFF REPAIR  ~ 2009   left   SHOULDER ARTHROSCOPY W/ ROTATOR CUFF REPAIR Right 09/21/2011   SKIN CANCER EXCISION     nose, forehead, left leg   TEE WITHOUT CARDIOVERSION N/A 03/01/2017   Procedure: TRANSESOPHAGEAL ECHOCARDIOGRAM (TEE);  Surgeon: Chilton Si, MD;  Location: [facility] ENDOSCOPY;  Service: Cardiovascular;  Laterality: N/A;   THYROID LOBECTOMY  ~ 2006   right   TUBAL LIGATION  ~ 1983   VAGINAL HYSTERECTOMY  ~ 2009   VESICOVAGINAL FISTULA CLOSURE W/ TAH     Family History  Problem Relation Age of Onset    Heart disease Mother    Heart failure Mother 14       CHF   Stroke Mother    Hypertension Mother    Heart disease Father 69   Macular degeneration Father    Heart disease Sister    Atrial fibrillation Sister    Heart disease Sister    Outpatient Medications Prior to Visit  Medication Sig Dispense Refill   acetaminophen (TYLENOL) 325 MG tablet Take 650 mg by mouth as needed.     albuterol (VENTOLIN HFA) 108 (90 Base) MCG/ACT inhaler Inhale 2 puffs into the lungs every 6 (six) hours as needed for wheezing or shortness of breath. (Patient not taking: Reported on 02/03/2023) 8 g 2   ALPRAZolam (XANAX) 0.5 MG tablet Take 0.5 mg by mouth 3 (three) times daily as needed for anxiety. (Patient not taking: Reported on 02/03/2023)     anastrozole (ARIMIDEX) 1 MG tablet Take 1 tablet (1 mg total) by mouth daily. 90 tablet 0   apixaban (ELIQUIS) 5 MG TABS tablet Take 1 tablet (5 mg total) by mouth 2 (two) times daily. 180 tablet 1   Azelaic  Acid 15 % gel Apply 1 Application topically 2 (two) times daily.     BOTOX 200 units injection Once every 4 months     buPROPion (WELLBUTRIN XL) 150 MG 24 hr tablet Take 1 tablet (150 mg total) by mouth daily. 90 tablet 3   CALCIUM PO Take 2 tablets by mouth daily.     cetirizine (ZYRTEC) 10 MG tablet Take 10 mg by mouth daily.     Cyanocobalamin 1000 MCG/ML KIT Inject 1,000 mcg as directed every 30 (thirty) days. (Patient not taking: Reported on 02/03/2023)     eletriptan (RELPAX) 40 MG tablet Take 1 tablet (40 mg total) by mouth every 2 (two) hours as needed for migraine or headache. May repeat in 2 hours if headache persists or recurs. (Patient not taking: Reported on 02/03/2023) 10 tablet 2   famotidine (PEPCID) 20 MG tablet Take 20 mg by mouth daily as needed for heartburn or indigestion.     gabapentin (NEURONTIN) 600 MG tablet Take 600 mg by mouth 2 (two) times daily.     hydrOXYzine (ATARAX) 10 MG tablet Take 1 tablet (10 mg total) by mouth daily as needed for  itching. 30 tablet 0   levothyroxine (SYNTHROID, LEVOTHROID) 100 MCG tablet Take 100 mcg daily before breakfast by mouth.     Melatonin 5 MG CAPS Take 10 mg by mouth at bedtime.     Menthol (BIOFREEZE) 10 % AERO      Omega-3 Fatty Acids (FISH OIL) 1000 MG CAPS 1 capsule     omeprazole (PRILOSEC) 40 MG capsule Take 1 capsule (40 mg total) by mouth daily. 90 capsule 0   polyethylene glycol (MIRALAX / GLYCOLAX) packet Take 17 g by mouth every evening.     propranolol (INDERAL) 60 MG tablet Take 1 tablet (60 mg total) by mouth daily. 90 tablet 3   spironolactone (ALDACTONE) 25 MG tablet Take 0.5 tablets (12.5 mg total) by mouth daily. 45 tablet 3   Varenicline Tartrate (TYRVAYA) 0.03 MG/ACT SOLN Place into the nose.     doxycycline (VIBRA-TABS) 100 MG tablet Take 1 tablet (100 mg total) by mouth 2 (two) times daily. 14 tablet 0   VITAMIN D PO Take 2 capsules by mouth daily. (Patient not taking: Reported on 02/03/2023)     No facility-administered medications prior to visit.   Allergies  Allergen Reactions   Other Nausea And Vomiting and Other (See Comments)    Most narcotic pain meds cause N/V and CHEST PAIN; needs an antiemetic to tolerate  Also, patient was diagnosed with Raynaud's disease and was told to NOT place IV(s) in either hand because of poor circulation   Iodine Other (See Comments)    "Blisters"   Tape Other (See Comments)    PATIENT'S SKIN IS VERY THIN; IT TEARS, BRUISES, AND BLEEDS VERY EASILY (PLEASE USE COBAN WRAP, IF POSSIBLE!!)   Codeine Other (See Comments)    Chest pain   Fentanyl Other (See Comments)    Bad dreams   Morphine And Codeine Nausea And Vomiting     ROS: A complete ROS was performed with pertinent positives/negatives noted in the HPI. The remainder of the ROS are negative.    Objective:   Today's Vitals   02/03/23 1059  BP: (!) 91/58  Pulse: (!) 57  SpO2: 98%  Weight: 163 lb 4.8 oz (74.1 kg)  Height: 5\' 7"  (1.702 m)  PainSc: 4   PainLoc: Leg     Physical Exam  GENERAL: Well-appearing, in NAD. Well nourished.  SKIN: Pink, warm and dry. No rash, lesion, ulceration, or ecchymoses.  Head: Normocephalic. NECK: Trachea midline. Full ROM w/o pain or tenderness.  RESPIRATORY: Chest wall symmetrical. Respirations even and non-labored.  MSK: Muscle tone and strength appropriate for age.  EXTREMITIES: Without clubbing, cyanosis. Mild non pitting +1 edema to bilateral lower extremities.   Approximately 8 cm laceration present to right lower extremity without dehiscence. Small amount of dried blood present and small amount of clear-yellow serous drainage with granulation tissue present to left aspect of wound. Right side of wound is completely intact without drainage.  No active bleeding.  NEUROLOGIC: No motor or sensory deficits. Steady, even gait. C2-C12 intact.  PSYCH/MENTAL STATUS: Alert, oriented x 3. Cooperative, appropriate mood and affect.      Assessment & Plan:  1. Cellulitis of right lower extremity (Primary) Cellulitis resolved at this time. Will continue with wound care as described below.   2. Delayed healing of traumatic wound 3. Injury of right lower leg, sequela Left sided aspect of wound is having delayed healing likely due to chronic reduced perfusion to bilateral lower extremities and chronic flexion/extension of right lower extremity with ambulation. Pt may benefit from immobilization of RLE and has been fitted with a short AirCast to immobilize ankle. Wound dressing changed and steri-strips, bacitracin, non adherent gauze and kerlex placed. Wound care reviewed with patient and will start using Mupirocin BID to wound site. Referral placed to Wound Care due to delayed healing for further evaluation and management. Return in 2 weeks or sooner for follow up.   - Ambulatory referral to Orthopedic Surgery  4. Vaginal yeast infection Vaginal itching likely due ot recent abx use. Will use Mycolog cream BID to vaginal  area for itching and irritation for 7 days.  - nystatin-triamcinolone ointment (MYCOLOG); Apply 1 application to external vaginal surface twice daily for 7 days.  Dispense: 30 g; Refill: 2   Meds ordered this encounter  Medications   DISCONTD: mupirocin ointment (BACTROBAN) 2 %    Sig: Apply 1 application to right lower leg twice daily.    Dispense:  30 g    Refill:  3    Supervising Provider:   DE Peru, RAYMOND J [[id number]]   nystatin-triamcinolone ointment (MYCOLOG)    Sig: Apply 1 application to external vaginal surface twice daily for 7 days.    Dispense:  30 g    Refill:  2    Supervising Provider:   DE [place], RAYMOND J [[id number]]   Return in 2 weeks (on 02/17/2023) for Wound Check .    Patient to reach out to office if new, worrisome, or unresolved symptoms arise or if no improvement in patient's condition. Patient verbalized understanding and is agreeable to treatment plan. All questions answered to patient's satisfaction.    Hilbert Bible, [place]

## 2023-02-04 ENCOUNTER — Other Ambulatory Visit (HOSPITAL_BASED_OUTPATIENT_CLINIC_OR_DEPARTMENT_OTHER): Payer: Self-pay | Admitting: Internal Medicine

## 2023-02-06 ENCOUNTER — Ambulatory Visit (INDEPENDENT_AMBULATORY_CARE_PROVIDER_SITE_OTHER): Payer: Medicare PPO | Admitting: Orthopedic Surgery

## 2023-02-06 ENCOUNTER — Encounter: Payer: Self-pay | Admitting: Orthopedic Surgery

## 2023-02-06 DIAGNOSIS — M6701 Short Achilles tendon (acquired), right ankle: Secondary | ICD-10-CM | POA: Diagnosis not present

## 2023-02-06 DIAGNOSIS — L97919 Non-pressure chronic ulcer of unspecified part of right lower leg with unspecified severity: Secondary | ICD-10-CM

## 2023-02-06 DIAGNOSIS — M6702 Short Achilles tendon (acquired), left ankle: Secondary | ICD-10-CM

## 2023-02-06 DIAGNOSIS — I83019 Varicose veins of right lower extremity with ulcer of unspecified site: Secondary | ICD-10-CM | POA: Diagnosis not present

## 2023-02-06 NOTE — Progress Notes (Signed)
Office Visit Note   Patient: [name]           Date of Birth: 11-Jun-1955           MRN: [id number] Visit Date: 02/06/2023              Requested by: Hilbert Bible, FNP [address] [place],  [place] [address] PCP: Hilbert Bible, FNP  Chief Complaint  Patient presents with   Right Leg - Wound Check      HPI: Patient is a 68 year old woman who is seen for initial evaluation for traumatic venous ulcer right leg.  Patient stated a book fell on her leg and she went to the emergency room on January 4.  Patient states there was sutures placed in the emergency room which have been removed.  Patient was on antibiotics and this was stopped secondary to itching.  Patient currently has a fracture boot which she states is uncomfortable.  Assessment & Plan: Visit Diagnoses:  1. Venous ulcer of right leg (HCC)     Plan: Recommended Dial soap cleansing compression wraps daily.  Will plan to proceed with a knee-high compression sock at follow-up.  Follow-Up Instructions: Return in about 2 weeks (around 02/20/2023).   Ortho Exam  Patient is alert, oriented, no adenopathy, well-dressed, normal affect, normal respiratory effort. Examination the venous ulcer is healing well.  She has a strong palpable dorsalis pedis pulse.  She has scars from previous claw toes surgery.  The traumatic laceration is healing well with healthy granulation tissue.  He currently measures 5 cm in length and 5 mm in width transversely across the distal leg.  There is venous swelling but no cellulitis.  Patient has callus beneath the first metatarsal head of the left foot.  She has dorsiflexion of the ankle to neutral with her knee extended.  Patient appears to have the callus from overloading secondary to the Achilles contracture.  Patient was given instructions for Achilles stretching.  Imaging: No results found.   Labs: Lab Results  Component Value Date   ESRSEDRATE 4  01/09/2023   ESRSEDRATE 18 04/07/2021   ESRSEDRATE 55 (H) 02/12/2010   CRP 6 01/09/2023   REPTSTATUS 03/02/2017 FINAL 02/25/2017   GRAMSTAIN  02/09/2010    RARE WBC PRESENT,BOTH PMN AND MONONUCLEAR NO ORGANISMS SEEN   CULT  02/25/2017    NO GROWTH 5 DAYS Performed at [facility], [address] [place], [place] [address]      Lab Results  Component Value Date   ALBUMIN 4.0 02/04/2021   ALBUMIN 3.2 (L) 02/27/2017   ALBUMIN 3.9 02/25/2017    Lab Results  Component Value Date   MG 1.7 02/12/2010   MG 1.6 02/11/2010   MG 2.0 02/09/2010   No results found for: "VD25OH"  No results found for: "PREALBUMIN"    Latest Ref Rng & Units 01/09/2023    2:52 PM 08/17/2022    3:28 PM 09/06/2021   10:20 PM  CBC EXTENDED  WBC 3.4 - 10.8 x10E3/uL 9.6  9.4  13.2   RBC 3.77 - 5.28 x10E6/uL 4.70  4.70  4.39   Hemoglobin 11.1 - 15.9 g/dL 41.3  24.4  01.0   HCT 34.0 - 46.6 % 43.1  42.4  42.4   Platelets 150 - 450 x10E3/uL 356  374  256   NEUT# 1.4 - 7.0 x10E3/uL 5.6  5.1  10.4   Lymph# 0.7 - 3.1 x10E3/uL 2.4  2.4  1.1      There is no height or weight on file to calculate BMI.  Orders:  No orders of the defined types were placed in this encounter.  No orders of the defined types were placed in this encounter.    Procedures: No procedures performed  Clinical Data: No additional findings.  ROS:  All other systems negative, except as noted in the HPI. Review of Systems  Objective: Vital Signs: There were no vitals taken for this visit.  Specialty Comments:  No specialty comments available.  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   Rosacea 11/05/2019   Scar 11/05/2019   Biventricular cardiac pacemaker in situ 10/08/2018   Long term current use of anticoagulant therapy 10/23/2017   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   Trigeminal neuralgia 12/14/2012   Insomnia, persistent 11/03/2011   AICD (automatic cardioverter/defibrillator) present    Aortic regurgitation    Nonischemic cardiomyopathy (HCC) 01/21/2011   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/17/2010   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   Esophagitis, reflux 08/24/2010   Idiopathic peripheral neuropathy 08/24/2010   Chronic systolic CHF (congestive heart failure) (HCC) 02/24/2010   Paroxysmal atrial fibrillation (HCC) 02/23/2010   Hypothyroidism    History of migraine headaches    Left bundle branch block    Idiopathic scoliosis and kyphoscoliosis     Class: Chronic   Lumbar canal stenosis 12/25/2009   Menopause 04/15/2009   Anxiety state 07/24/2008   Avitaminosis D 03/21/2008   Atypical migraine 01/30/2008   Benign essential HTN 01/30/2008   Chronic migraine w/o aura w/o status migrainosus, not intractable 01/30/2008   Paroxysmal digital cyanosis 02/15/2007   Raynaud's phenomenon 02/15/2007   Past Medical History:  Diagnosis Date   AF (paroxysmal atrial fibrillation) (HCC)    Arthritis    BCC (basal cell carcinoma) 03/11/2003   left distal lower leg ankle-tx dr Danella Deis   Biventricular cardiac pacemaker in situ    a. prior CRT-D in 2013 with downgrade to CRT-Pacemaker only in 11/2016.   Breast cancer (HCC) 12/12/2019   Chronic systolic CHF (congestive heart failure) (HCC)    Complication of anesthesia    aspiration   Essential tremor    GERD (gastroesophageal reflux disease)    otc   Hypertension    Hypothyroidism    LBBB (left bundle branch block)     conduction disorder   Migraines    Mild aortic insufficiency    NICM (nonischemic cardiomyopathy) (HCC)    a. 2012: normal coronaries, EF 25-30%. b. improved to 55% 11/2016.   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   in situ- right lower leg-anterior (CX35FU)   Scoliosis     Family History  Problem Relation Age of Onset   Heart disease Mother    Heart failure Mother 58  CHF   Stroke Mother    Hypertension Mother    Heart disease Father 32   Macular degeneration Father    Heart disease Sister    Atrial fibrillation Sister    Heart disease Sister     Past Surgical History:  Procedure Laterality Date   ARTERY BIOPSY  12/29/2011   Procedure: MINOR BIOPSY TEMPORAL ARTERY;  Surgeon: Mariella Saa, MD;  Location: [facility];  Service: General;  Laterality: Right;  Right temporal artery biopsy   BACK SURGERY  12/31/2009   "for flat back syndrome; fused bottom of my back"   BI-VENTRICULAR IMPLANTABLE CARDIOVERTER DEFIBRILLATOR Left 01/20/2011   Procedure: BI-VENTRICULAR IMPLANTABLE CARDIOVERTER DEFIBRILLATOR  (CRT-D);  Surgeon: Marinus Maw, MD;  Location: [facility] CATH LAB;  Service: Cardiovascular;  Laterality: Left;   BIV ICD GENERATOR CHANGEOUT N/A 11/24/2016   Procedure: BIV ICD GENERATOR CHANGEOUT;  Surgeon: Marinus Maw, MD;  Location: [facility] INVASIVE CV LAB;  Service: Cardiovascular;  Laterality: N/A;   BREAST LUMPECTOMY WITH RADIOACTIVE SEED LOCALIZATION Left 01/22/2020   Procedure: LEFT BREAST LUMPECTOMY WITH RADIOACTIVE SEED LOCALIZATION;  Surgeon: Manus Rudd, MD;  Location: [facility];  Service: General;  Laterality: Left;  LMA   CHOLECYSTECTOMY  ~1996   FOOT NEUROMA SURGERY  ~ 2003   right   RE-EXCISION OF BREAST  LUMPECTOMY Left 03/03/2020   Procedure: RE-EXCISION MARGINS OF LEFT BREAST LUMPECTOMY;  Surgeon: Manus Rudd, MD;  Location: [facility];  Service: General;  Laterality: Left;   ROTATOR CUFF REPAIR  ~ 2009   left   SHOULDER ARTHROSCOPY W/ ROTATOR CUFF REPAIR Right 09/21/2011   SKIN CANCER EXCISION     nose, forehead, left leg   TEE WITHOUT CARDIOVERSION N/A 03/01/2017   Procedure: TRANSESOPHAGEAL ECHOCARDIOGRAM (TEE);  Surgeon: Chilton Si, MD;  Location: [facility] ENDOSCOPY;  Service: Cardiovascular;  Laterality: N/A;   THYROID LOBECTOMY  ~ 2006   right   TUBAL LIGATION  ~ 1983   VAGINAL HYSTERECTOMY  ~ 2009   VESICOVAGINAL FISTULA CLOSURE W/ TAH     Social History   Occupational History   Occupation: Runner, broadcasting/film/video    Comment: n/a  Tobacco Use   Smoking status: Never    Passive exposure: Never   Smokeless tobacco: Never  Vaping Use   Vaping status: Never Used  Substance and Sexual Activity   Alcohol use: No   Drug use: No   Sexual activity: Yes

## 2023-02-15 DIAGNOSIS — M7912 Myalgia of auxiliary muscles, head and neck: Secondary | ICD-10-CM | POA: Diagnosis not present

## 2023-02-15 DIAGNOSIS — G894 Chronic pain syndrome: Secondary | ICD-10-CM | POA: Diagnosis not present

## 2023-02-15 DIAGNOSIS — M47812 Spondylosis without myelopathy or radiculopathy, cervical region: Secondary | ICD-10-CM | POA: Diagnosis not present

## 2023-02-15 DIAGNOSIS — M461 Sacroiliitis, not elsewhere classified: Secondary | ICD-10-CM | POA: Diagnosis not present

## 2023-02-21 ENCOUNTER — Ambulatory Visit: Payer: Medicare PPO | Admitting: Orthopedic Surgery

## 2023-02-21 ENCOUNTER — Ambulatory Visit (INDEPENDENT_AMBULATORY_CARE_PROVIDER_SITE_OTHER): Payer: Medicare PPO

## 2023-02-21 DIAGNOSIS — M6701 Short Achilles tendon (acquired), right ankle: Secondary | ICD-10-CM | POA: Diagnosis not present

## 2023-02-21 DIAGNOSIS — I5022 Chronic systolic (congestive) heart failure: Secondary | ICD-10-CM

## 2023-02-21 DIAGNOSIS — L97919 Non-pressure chronic ulcer of unspecified part of right lower leg with unspecified severity: Secondary | ICD-10-CM | POA: Diagnosis not present

## 2023-02-21 DIAGNOSIS — M6702 Short Achilles tendon (acquired), left ankle: Secondary | ICD-10-CM

## 2023-02-21 DIAGNOSIS — I428 Other cardiomyopathies: Secondary | ICD-10-CM | POA: Diagnosis not present

## 2023-02-21 DIAGNOSIS — I83019 Varicose veins of right lower extremity with ulcer of unspecified site: Secondary | ICD-10-CM | POA: Diagnosis not present

## 2023-02-22 LAB — CUP PACEART REMOTE DEVICE CHECK
Battery Remaining Longevity: 42 mo
Battery Voltage: 2.93 V
Brady Statistic AP VP Percent: 2.42 %
Brady Statistic AP VS Percent: 0.07 %
Brady Statistic AS VP Percent: 95.7 %
Brady Statistic AS VS Percent: 1.81 %
Brady Statistic RA Percent Paced: 2.51 %
Brady Statistic RV Percent Paced: 0.87 %
Date Time Interrogation Session: 20250210220819
Implantable Lead Connection Status: 753985
Implantable Lead Connection Status: 753985
Implantable Lead Connection Status: 753985
Implantable Lead Implant Date: 20130110
Implantable Lead Implant Date: 20130110
Implantable Lead Implant Date: 20130110
Implantable Lead Location: 753858
Implantable Lead Location: 753859
Implantable Lead Location: 753860
Implantable Lead Model: 4196
Implantable Lead Model: 5076
Implantable Lead Model: 6935
Implantable Pulse Generator Implant Date: 20181115
Lead Channel Impedance Value: 1216 Ohm
Lead Channel Impedance Value: 323 Ohm
Lead Channel Impedance Value: 380 Ohm
Lead Channel Impedance Value: 608 Ohm
Lead Channel Impedance Value: 703 Ohm
Lead Channel Impedance Value: 703 Ohm
Lead Channel Impedance Value: 722 Ohm
Lead Channel Impedance Value: 741 Ohm
Lead Channel Impedance Value: 836 Ohm
Lead Channel Pacing Threshold Amplitude: 0.5 V
Lead Channel Pacing Threshold Amplitude: 1.5 V
Lead Channel Pacing Threshold Amplitude: 2.5 V
Lead Channel Pacing Threshold Pulse Width: 0.4 ms
Lead Channel Pacing Threshold Pulse Width: 0.4 ms
Lead Channel Pacing Threshold Pulse Width: 0.8 ms
Lead Channel Sensing Intrinsic Amplitude: 0.875 mV
Lead Channel Sensing Intrinsic Amplitude: 0.875 mV
Lead Channel Sensing Intrinsic Amplitude: 16.5 mV
Lead Channel Sensing Intrinsic Amplitude: 16.5 mV
Lead Channel Setting Pacing Amplitude: 2 V
Lead Channel Setting Pacing Amplitude: 2.5 V
Lead Channel Setting Pacing Amplitude: 3.5 V
Lead Channel Setting Pacing Pulse Width: 0.8 ms
Lead Channel Setting Pacing Pulse Width: 1 ms
Lead Channel Setting Sensing Sensitivity: 0.9 mV
Zone Setting Status: 755011
Zone Setting Status: 755011

## 2023-02-23 ENCOUNTER — Other Ambulatory Visit: Payer: Self-pay

## 2023-02-23 MED ORDER — SPIRONOLACTONE 25 MG PO TABS: 12.5000 mg | ORAL_TABLET | Freq: Every day | ORAL | 3 refills | Status: AC

## 2023-02-23 NOTE — Telephone Encounter (Signed)
Refilled aldactone 12.5 mg daily #45, RF:3 to [facility]

## 2023-02-24 ENCOUNTER — Encounter: Payer: Self-pay | Admitting: Orthopedic Surgery

## 2023-02-24 NOTE — Progress Notes (Signed)
Office Visit Note   Patient: [name]           Date of Birth: 1955/10/16           MRN: [id number] Visit Date: 02/21/2023              Requested by: Hilbert Bible, FNP [address] [place],  [place] [address] PCP: Hilbert Bible, FNP  Chief Complaint  Patient presents with   Right Leg - Wound Check      HPI: Patient is a 68 year old woman who presents with ulceration right lower extremity.  Patient states she has developed a new knot around the ankle.  Assessment & Plan: Visit Diagnoses:  1. Venous ulcer of right leg (HCC)   2. Contracture of both Achilles tendons     Plan: Recommended knee-high compression for the venous insufficiency.  Recommended stretching for the Achilles contracture.  This was demonstrated.  Follow-Up Instructions: Return if symptoms worsen or fail to improve.   Ortho Exam  Patient is alert, oriented, no adenopathy, well-dressed, normal affect, normal respiratory effort. Examination patient does have some swelling medial right ankle which appears just below the traumatic laceration appears to be swelling from the greater saphenous vein.  There is no cellulitis no pain to palpation.  Patient has pain beneath the metatarsal head on the left with Achilles tightness.  Patient is ambulating in bedroom slippers.  The traumatic laceration is well-healed.  Imaging: No results found. No images are attached to the encounter.  Labs: Lab Results  Component Value Date   ESRSEDRATE 4 01/09/2023   ESRSEDRATE 18 04/07/2021   ESRSEDRATE 55 (H) 02/12/2010   CRP 6 01/09/2023   REPTSTATUS 03/02/2017 FINAL 02/25/2017   GRAMSTAIN  02/09/2010    RARE WBC PRESENT,BOTH PMN AND MONONUCLEAR NO ORGANISMS SEEN   CULT  02/25/2017    NO GROWTH 5 DAYS Performed at [facility], [address], [place], [place] [address]      Lab Results  Component Value Date   ALBUMIN 4.0 02/04/2021   ALBUMIN 3.2 (L) 02/27/2017    ALBUMIN 3.9 02/25/2017    Lab Results  Component Value Date   MG 1.7 02/12/2010   MG 1.6 02/11/2010   MG 2.0 02/09/2010   No results found for: "VD25OH"  No results found for: "PREALBUMIN"    Latest Ref Rng & Units 01/09/2023    2:52 PM 08/17/2022    3:28 PM 09/06/2021   10:20 PM  CBC EXTENDED  WBC 3.4 - 10.8 x10E3/uL 9.6  9.4  13.2   RBC 3.77 - 5.28 x10E6/uL 4.70  4.70  4.39   Hemoglobin 11.1 - 15.9 g/dL 84.6  96.2  95.2   HCT 34.0 - 46.6 % 43.1  42.4  42.4   Platelets 150 - 450 x10E3/uL 356  374  256   NEUT# 1.4 - 7.0 x10E3/uL 5.6  5.1  10.4   Lymph# 0.7 - 3.1 x10E3/uL 2.4  2.4  1.1      There is no height or weight on file to calculate BMI.  Orders:  No orders of the defined types were placed in this encounter.  No orders of the defined types were placed in this encounter.    Procedures: No procedures performed  Clinical Data: No additional findings.  ROS:  All other systems negative, except as noted in the HPI. Review of Systems  Objective: Vital Signs: There were no vitals taken for this visit.  Specialty  Comments:  No specialty comments available.  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   Rosacea 11/05/2019   Scar 11/05/2019   Biventricular cardiac pacemaker in situ 10/08/2018   Long term current use of anticoagulant therapy 10/23/2017   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   Trigeminal neuralgia 12/14/2012   Insomnia, persistent 11/03/2011   AICD (automatic  cardioverter/defibrillator) present    Aortic regurgitation    Nonischemic cardiomyopathy (HCC) 01/21/2011   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/17/2010   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   Esophagitis, reflux 08/24/2010   Idiopathic peripheral neuropathy 08/24/2010   Chronic systolic CHF (congestive heart failure) (HCC) 02/24/2010   Paroxysmal atrial fibrillation (HCC) 02/23/2010   Hypothyroidism    History of migraine headaches    Left bundle branch block    Idiopathic scoliosis and kyphoscoliosis     Class: Chronic   Lumbar canal stenosis 12/25/2009   Menopause 04/15/2009   Anxiety state 07/24/2008   Avitaminosis D 03/21/2008   Atypical migraine 01/30/2008   Benign essential HTN 01/30/2008   Chronic migraine w/o aura w/o status migrainosus, not intractable 01/30/2008   Paroxysmal digital cyanosis 02/15/2007   Raynaud's phenomenon 02/15/2007   Past Medical History:  Diagnosis Date   AF (paroxysmal atrial fibrillation) (HCC)    Arthritis    BCC (basal cell carcinoma) 03/11/2003   left distal lower leg ankle-tx dr Danella Deis   Biventricular cardiac pacemaker in situ    a. prior CRT-D in 2013 with downgrade to CRT-Pacemaker only in 11/2016.   Breast cancer (HCC) 12/12/2019   Chronic systolic CHF (congestive heart failure) (HCC)    Complication of anesthesia    aspiration   Essential tremor    GERD (gastroesophageal reflux disease)    otc   Hypertension    Hypothyroidism    LBBB (left bundle branch block)    conduction disorder   Migraines    Mild aortic insufficiency    NICM (nonischemic cardiomyopathy) (HCC)    a. 2012: normal coronaries, EF 25-30%. b. improved to 55% 11/2016.   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    in situ- right lower leg-anterior ([id number])   Scoliosis     Family History  Problem Relation Age of Onset   Heart disease Mother    Heart failure Mother 22       CHF   Stroke Mother    Hypertension Mother    Heart disease Father 52   Macular degeneration Father    Heart disease Sister    Atrial fibrillation Sister    Heart disease Sister     Past Surgical History:  Procedure Laterality Date   ARTERY BIOPSY  12/29/2011   Procedure: MINOR BIOPSY TEMPORAL ARTERY;  Surgeon: Mariella Saa, MD;  Location: [facility];  Service: General;  Laterality: Right;  Right temporal artery biopsy   BACK SURGERY  12/31/2009   "for flat back syndrome; fused bottom of my back"   BI-VENTRICULAR IMPLANTABLE CARDIOVERTER DEFIBRILLATOR Left 01/20/2011   Procedure: BI-VENTRICULAR IMPLANTABLE CARDIOVERTER DEFIBRILLATOR  (CRT-D);  Surgeon: Marinus Maw, MD;  Location: [facility] CATH LAB;  Service: Cardiovascular;  Laterality: Left;   BIV ICD GENERATOR CHANGEOUT N/A 11/24/2016   Procedure: BIV ICD GENERATOR CHANGEOUT;  Surgeon: Marinus Maw, MD;  Location: [facility] INVASIVE CV LAB;  Service: Cardiovascular;  Laterality: N/A;   BREAST LUMPECTOMY WITH RADIOACTIVE SEED LOCALIZATION Left 01/22/2020   Procedure: LEFT BREAST LUMPECTOMY WITH RADIOACTIVE SEED LOCALIZATION;  Surgeon: Manus Rudd, MD;  Location: [facility];  Service: General;  Laterality: Left;  LMA   CHOLECYSTECTOMY  ~1996   FOOT NEUROMA SURGERY  ~ 2003   right   RE-EXCISION OF BREAST LUMPECTOMY Left 03/03/2020   Procedure: RE-EXCISION MARGINS OF LEFT BREAST LUMPECTOMY;  Surgeon: Manus Rudd, MD;  Location: [facility];  Service: General;  Laterality: Left;   ROTATOR CUFF REPAIR  ~ 2009   left   SHOULDER ARTHROSCOPY W/ ROTATOR CUFF REPAIR Right 09/21/2011   SKIN CANCER EXCISION     nose, forehead, left leg   TEE WITHOUT CARDIOVERSION N/A 03/01/2017   Procedure: TRANSESOPHAGEAL ECHOCARDIOGRAM (TEE);   Surgeon: Chilton Si, MD;  Location[facility];  Service: Cardiovascular;  Laterality: N/A;   THYROID LOBECTOMY  ~ 2006   right   TUBAL LIGATION  ~ 1983   VAGINAL HYSTERECTOMY  ~ 2009   VESICOVAGINAL FISTULA CLOSURE W/ TAH     Social History   Occupational History   Occupation: Runner, broadcasting/film/video    Comment: n/a  Tobacco Use   Smoking status: Never    Passive exposure: Never   Smokeless tobacco: Never  Vaping Use   Vaping status: Never Used  Substance and Sexual Activity   Alcohol use: No   Drug use: No   Sexual activity: Yes

## 2023-02-26 ENCOUNTER — Encounter: Payer: Self-pay | Admitting: Internal Medicine

## 2023-03-06 ENCOUNTER — Inpatient Hospital Stay: Payer: Medicare PPO | Attending: Oncology

## 2023-03-06 ENCOUNTER — Inpatient Hospital Stay: Payer: Medicare PPO

## 2023-03-06 VITALS — BP 114/51 | HR 50 | Temp 98.1°F

## 2023-03-06 DIAGNOSIS — M810 Age-related osteoporosis without current pathological fracture: Secondary | ICD-10-CM

## 2023-03-06 DIAGNOSIS — D0512 Intraductal carcinoma in situ of left breast: Secondary | ICD-10-CM | POA: Insufficient documentation

## 2023-03-06 LAB — BASIC METABOLIC PANEL - CANCER CENTER ONLY
Anion gap: 7 (ref 5–15)
BUN: 27 mg/dL — ABNORMAL HIGH (ref 8–23)
CO2: 32 mmol/L (ref 22–32)
Calcium: 9.5 mg/dL (ref 8.9–10.3)
Chloride: 102 mmol/L (ref 98–111)
Creatinine: 1.05 mg/dL — ABNORMAL HIGH (ref 0.44–1.00)
GFR, Estimated: 58 mL/min — ABNORMAL LOW (ref >60)
Glucose, Bld: 84 mg/dL (ref 70–99)
Potassium: 3.9 mmol/L (ref 3.5–5.1)
Sodium: 141 mmol/L (ref 135–145)

## 2023-03-06 MED ORDER — SODIUM CHLORIDE 0.9 % IV SOLN: Freq: Once | INTRAVENOUS | Status: AC

## 2023-03-06 MED ORDER — ZOLEDRONIC ACID 4 MG/100ML IV SOLN
4.0000 mg | Freq: Once | INTRAVENOUS | Status: AC
  Administered 2023-03-06: 4 mg via INTRAVENOUS
  Filled 2023-03-06: qty 100

## 2023-03-06 NOTE — Patient Instructions (Signed)

## 2023-03-10 ENCOUNTER — Ambulatory Visit (HOSPITAL_BASED_OUTPATIENT_CLINIC_OR_DEPARTMENT_OTHER)
Admission: RE | Admit: 2023-03-10 | Discharge: 2023-03-10 | Disposition: A | Discharge status: Home / Self Care | Payer: Medicare PPO | Source: Ambulatory Visit | Attending: Oncology | Admitting: Oncology

## 2023-03-10 DIAGNOSIS — M810 Age-related osteoporosis without current pathological fracture: Secondary | ICD-10-CM | POA: Diagnosis not present

## 2023-03-13 ENCOUNTER — Telehealth: Payer: Self-pay | Admitting: *Deleted

## 2023-03-13 NOTE — Telephone Encounter (Signed)
-----   Message from Thornton Papas sent at 03/13/2023  2:21 PM EST ----- Please call patient, bone density scan confirms osteoporosis, follow-up with primary provider, copy report to primary provider for management of osteoporosis

## 2023-03-13 NOTE — Telephone Encounter (Signed)
 Informed patient that Dr. Truett Perna reports her bone density test reveals that she has osteoporosis and she need to discuss this with her PCP at March visit. Forwarded report to PCP office.

## 2023-03-20 ENCOUNTER — Other Ambulatory Visit (HOSPITAL_BASED_OUTPATIENT_CLINIC_OR_DEPARTMENT_OTHER): Payer: Self-pay | Admitting: Family Medicine

## 2023-03-20 NOTE — Telephone Encounter (Signed)
 Please advise on refill request

## 2023-03-21 ENCOUNTER — Ambulatory Visit: Payer: Medicare PPO | Admitting: Dermatology

## 2023-03-21 NOTE — Telephone Encounter (Signed)
 Called and spoke with pt to see which dose of cymbalta she was on. Pt said med was d/c'd and she was switched to wellbutrin. Nothing further needed

## 2023-03-29 DIAGNOSIS — E039 Hypothyroidism, unspecified: Secondary | ICD-10-CM | POA: Diagnosis not present

## 2023-03-29 DIAGNOSIS — I110 Hypertensive heart disease with heart failure: Secondary | ICD-10-CM | POA: Diagnosis not present

## 2023-03-29 DIAGNOSIS — G43909 Migraine, unspecified, not intractable, without status migrainosus: Secondary | ICD-10-CM | POA: Diagnosis not present

## 2023-03-29 DIAGNOSIS — K219 Gastro-esophageal reflux disease without esophagitis: Secondary | ICD-10-CM | POA: Diagnosis not present

## 2023-03-29 DIAGNOSIS — M542 Cervicalgia: Secondary | ICD-10-CM | POA: Diagnosis not present

## 2023-03-29 DIAGNOSIS — I509 Heart failure, unspecified: Secondary | ICD-10-CM | POA: Diagnosis not present

## 2023-03-29 DIAGNOSIS — Z7901 Long term (current) use of anticoagulants: Secondary | ICD-10-CM | POA: Diagnosis not present

## 2023-03-29 DIAGNOSIS — I499 Cardiac arrhythmia, unspecified: Secondary | ICD-10-CM | POA: Diagnosis not present

## 2023-03-29 DIAGNOSIS — Z950 Presence of cardiac pacemaker: Secondary | ICD-10-CM | POA: Diagnosis not present

## 2023-03-29 DIAGNOSIS — M47812 Spondylosis without myelopathy or radiculopathy, cervical region: Secondary | ICD-10-CM | POA: Diagnosis not present

## 2023-03-29 HISTORY — PX: CERVICAL ABLATION: SHX5771

## 2023-04-03 ENCOUNTER — Other Ambulatory Visit (HOSPITAL_BASED_OUTPATIENT_CLINIC_OR_DEPARTMENT_OTHER): Payer: Self-pay | Admitting: *Deleted

## 2023-04-03 ENCOUNTER — Other Ambulatory Visit: Payer: Self-pay

## 2023-04-03 DIAGNOSIS — I480 Paroxysmal atrial fibrillation: Secondary | ICD-10-CM

## 2023-04-03 MED ORDER — APIXABAN 5 MG PO TABS: 5.0000 mg | ORAL_TABLET | Freq: Two times a day (BID) | ORAL | 1 refills | Status: DC

## 2023-04-03 MED ORDER — OMEPRAZOLE 40 MG PO CPDR: 40.0000 mg | DELAYED_RELEASE_CAPSULE | Freq: Every day | ORAL | 3 refills | Status: DC

## 2023-04-03 NOTE — Telephone Encounter (Signed)
 Prescription refill request for Eliquis received. Indication: Afib  Last office visit: 11/16/22 Ladona Ridgel)  Scr: 1.05 (03/06/23)  Age: 68 Weight: 73.9kg  Appropriate dose. Refill sent.

## 2023-04-03 NOTE — Addendum Note (Signed)
 Addended by: Geralyn Flash D on: 04/03/2023 02:50 PM   Modules accepted: Orders

## 2023-04-03 NOTE — Progress Notes (Signed)
 Remote pacemaker transmission.

## 2023-04-10 ENCOUNTER — Ambulatory Visit (HOSPITAL_BASED_OUTPATIENT_CLINIC_OR_DEPARTMENT_OTHER): Payer: Medicare PPO | Admitting: Family Medicine

## 2023-04-10 ENCOUNTER — Encounter (HOSPITAL_BASED_OUTPATIENT_CLINIC_OR_DEPARTMENT_OTHER): Payer: Self-pay | Admitting: Family Medicine

## 2023-04-10 VITALS — BP 108/61 | HR 50 | Ht 67.0 in | Wt 161.0 lb

## 2023-04-10 DIAGNOSIS — R195 Other fecal abnormalities: Secondary | ICD-10-CM | POA: Diagnosis not present

## 2023-04-10 DIAGNOSIS — M810 Age-related osteoporosis without current pathological fracture: Secondary | ICD-10-CM

## 2023-04-10 DIAGNOSIS — R350 Frequency of micturition: Secondary | ICD-10-CM

## 2023-04-10 DIAGNOSIS — N3001 Acute cystitis with hematuria: Secondary | ICD-10-CM | POA: Diagnosis not present

## 2023-04-10 LAB — POCT URINALYSIS DIP (CLINITEK)
Bilirubin, UA: NEGATIVE
Glucose, UA: NEGATIVE mg/dL
Ketones, POC UA: NEGATIVE mg/dL
Leukocytes, UA: NEGATIVE
Nitrite, UA: NEGATIVE
POC PROTEIN,UA: 30 — AB
Spec Grav, UA: 1.025 (ref 1.010–1.025)
Urobilinogen, UA: 0.2 U/dL
pH, UA: 5.5 (ref 5.0–8.0)

## 2023-04-10 MED ORDER — NITROFURANTOIN MONOHYD MACRO 100 MG PO CAPS: 100.0000 mg | ORAL_CAPSULE | Freq: Two times a day (BID) | ORAL | 0 refills | Status: DC

## 2023-04-10 NOTE — Progress Notes (Unsigned)
 Subjective:   [name] 12/09/1955 04/10/2023  Chief Complaint  Patient presents with   Medical Management of Chronic Issues    26-month follow up; states that oncology wanted her to get an Rx for fosamax to help with her bones. Has been having problems with her abdomen and states when she has a bowel movement, her stools are dark in color and states she thought she might be getting a UTI so started taking macrobid to see if that would help.     HPI: [name] presents today for re-assessment and management of chronic medical conditions.  OSTEOPOROSIS: [name] presents for the medical management of Osteoporosis. Patient was previously receiving Reclast injections by Oncology for prevention of chemotherapy associated bone density loss. She recently had DXA on 03/10/2023 which showed T-score of -3.3, femur total mean -2.0, femur neck left -1.9.  Patient was recommended follow-up with PCP regarding further osteoporosis management.   Patient has adequate calcium & vitamin D: yes Taking bisphosphonate's: Reclast per oncology Medication compliance: excellent compliance Weight bearing exercises: no Recent falls:  Yes; reports fall without fracture approx. 2 months ago  most current DEXA scan T-score is -3.3.   DARK STOOLS:  Patient reports recent cervical neck ablation by Dr. Beverely Pace with pain medicine on 03/29/2023.  She states since this time she has been having significant pain and "waves of pain" from the cervical spine.  She was taking pain medication and began taking Pepto-Bismol daily since 03/29/2023 to alleviate GI upset with pain medication.  She states she started to notice dark black stools and mild bleeding to several bowel movements in the past week.  She has a history of hemorrhoids and use a hemorrhoidal suppository with relief of bright red bleeding.  She is still noticing dark black stools with her bowel movements.  She denies diarrhea, constipation,  dizziness, history of GI bleeding, nausea or vomiting.   UTI:  Patient states she has been having burning with urination for the past 2 days.  She has a history of UTIs.  She began to use leftover Macrobid prescription and has had improvement of her symptoms.  She denies blood in the urine, nausea, vomiting, CVA tenderness.  Reports mild frequency and urgency.  The following portions of the patient's history were reviewed and updated as appropriate: past medical history, past surgical history, family history, social history, allergies, medications, and problem list.   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   Rosacea 11/05/2019   Scar 11/05/2019   Biventricular cardiac pacemaker in situ 10/08/2018   Long term current use of anticoagulant therapy 10/23/2017   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   Trigeminal neuralgia 12/14/2012   Insomnia, persistent 11/03/2011   AICD (automatic cardioverter/defibrillator) present    Aortic regurgitation    Nonischemic cardiomyopathy (HCC) 01/21/2011   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/17/2010   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   Esophagitis, reflux 08/24/2010   Idiopathic peripheral neuropathy 08/24/2010   Chronic systolic CHF (congestive heart failure) (HCC) 02/24/2010  Paroxysmal atrial fibrillation (HCC) 02/23/2010   Hypothyroidism    History of migraine headaches    Left bundle branch block    Idiopathic scoliosis and kyphoscoliosis     Class:  Chronic   Lumbar canal stenosis 12/25/2009   Menopause 04/15/2009   Anxiety state 07/24/2008   Avitaminosis D 03/21/2008   Atypical migraine 01/30/2008   Benign essential HTN 01/30/2008   Chronic migraine w/o aura w/o status migrainosus, not intractable 01/30/2008   Paroxysmal digital cyanosis 02/15/2007   Raynaud's phenomenon 02/15/2007   Past Medical History:  Diagnosis Date   AF (paroxysmal atrial fibrillation) (HCC)    Arthritis    BCC (basal cell carcinoma) 03/11/2003   left distal lower leg ankle-tx dr Danella Deis   Biventricular cardiac pacemaker in situ    a. prior CRT-D in 2013 with downgrade to CRT-Pacemaker only in 11/2016.   Breast cancer (HCC) 12/12/2019   Chronic systolic CHF (congestive heart failure) (HCC)    Complication of anesthesia    aspiration   Essential tremor    GERD (gastroesophageal reflux disease)    otc   Hypertension    Hypothyroidism    LBBB (left bundle branch block)    conduction disorder   Migraines    Mild aortic insufficiency    NICM (nonischemic cardiomyopathy) (HCC)    a. 2012: normal coronaries, EF 25-30%. b. improved to 55% 11/2016.   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   in situ- right lower leg-anterior (CX35FU)   Scoliosis    Past Surgical History:  Procedure Laterality Date   ARTERY BIOPSY  12/29/2011   Procedure: MINOR BIOPSY TEMPORAL ARTERY;  Surgeon: Mariella Saa, MD;  Location: [facility];  Service: General;  Laterality: Right;  Right temporal artery biopsy   BACK SURGERY  12/31/2009   "for flat back syndrome; fused bottom of my back"   BI-VENTRICULAR IMPLANTABLE CARDIOVERTER DEFIBRILLATOR Left 01/20/2011   Procedure: BI-VENTRICULAR IMPLANTABLE CARDIOVERTER DEFIBRILLATOR  (CRT-D);  Surgeon:  Marinus Maw, MD;  Location: [facility] CATH LAB;  Service: Cardiovascular;  Laterality: Left;   BIV ICD GENERATOR CHANGEOUT N/A 11/24/2016   Procedure: BIV ICD GENERATOR CHANGEOUT;  Surgeon: Marinus Maw, MD;  Location: [facility] INVASIVE CV LAB;  Service: Cardiovascular;  Laterality: N/A;   BREAST LUMPECTOMY WITH RADIOACTIVE SEED LOCALIZATION Left 01/22/2020   Procedure: LEFT BREAST LUMPECTOMY WITH RADIOACTIVE SEED LOCALIZATION;  Surgeon: Manus Rudd, MD;  Location: [facility];  Service: General;  Laterality: Left;  LMA   CERVICAL ABLATION  03/29/2023   CHOLECYSTECTOMY  ~1996   FOOT NEUROMA SURGERY  ~ 2003   right   RE-EXCISION OF BREAST LUMPECTOMY Left 03/03/2020   Procedure: RE-EXCISION MARGINS OF LEFT BREAST LUMPECTOMY;  Surgeon: Manus Rudd, MD;  Location: [facility];  Service: General;  Laterality: Left;   ROTATOR CUFF REPAIR  ~ 2009   left   SHOULDER ARTHROSCOPY W/ ROTATOR CUFF REPAIR Right 09/21/2011   SKIN CANCER EXCISION     nose, forehead, left leg   TEE WITHOUT CARDIOVERSION N/A 03/01/2017   Procedure: TRANSESOPHAGEAL ECHOCARDIOGRAM (TEE);  Surgeon: Chilton Si, MD;  Location: [facility] ENDOSCOPY;  Service: Cardiovascular;  Laterality: N/A;   THYROID LOBECTOMY  ~ 2006   right   TUBAL LIGATION  ~  1983   VAGINAL HYSTERECTOMY  ~ 2009   VESICOVAGINAL FISTULA CLOSURE W/ TAH     Family History  Problem Relation Age of Onset   Heart disease Mother    Heart failure Mother 19       CHF   Stroke Mother    Hypertension Mother    Heart disease Father 85   Macular degeneration Father    Heart attack Sister    Heart disease Sister    Atrial fibrillation Sister    Heart disease Sister    Outpatient Medications Prior to Visit  Medication Sig Dispense Refill   acetaminophen (TYLENOL) 325 MG tablet Take 650 mg by mouth as needed.     albuterol (VENTOLIN HFA) 108 (90 Base) MCG/ACT inhaler Inhale 2 puffs into the lungs every 6 (six) hours as needed for  wheezing or shortness of breath. 8 g 2   anastrozole (ARIMIDEX) 1 MG tablet Take 1 tablet (1 mg total) by mouth daily. 90 tablet 0   apixaban (ELIQUIS) 5 MG TABS tablet Take 1 tablet (5 mg total) by mouth 2 (two) times daily. 180 tablet 1   Azelaic Acid 15 % gel Apply 1 Application topically 2 (two) times daily.     buPROPion (WELLBUTRIN XL) 150 MG 24 hr tablet Take 1 tablet (150 mg total) by mouth daily. 90 tablet 3   CALCIUM PO Take 2 tablets by mouth daily.     cetirizine (ZYRTEC) 10 MG tablet Take 10 mg by mouth daily.     eletriptan (RELPAX) 40 MG tablet Take 1 tablet (40 mg total) by mouth every 2 (two) hours as needed for migraine or headache. May repeat in 2 hours if headache persists or recurs. 10 tablet 2   famotidine (PEPCID) 20 MG tablet Take 20 mg by mouth daily as needed for heartburn or indigestion.     gabapentin (NEURONTIN) 600 MG tablet Take 600 mg by mouth 2 (two) times daily.     levothyroxine (SYNTHROID, LEVOTHROID) 100 MCG tablet Take 100 mcg daily before breakfast by mouth.     Melatonin 5 MG CAPS Take 10 mg by mouth at bedtime.     Menthol (BIOFREEZE) 10 % AERO      Omega-3 Fatty Acids (FISH OIL) 1000 MG CAPS 1 capsule     omeprazole (PRILOSEC) 40 MG capsule Take 1 capsule (40 mg total) by mouth daily. 90 capsule 3   polyethylene glycol (MIRALAX / GLYCOLAX) packet Take 17 g by mouth every evening.     propranolol (INDERAL) 60 MG tablet Take 1 tablet (60 mg total) by mouth daily. 90 tablet 3   spironolactone (ALDACTONE) 25 MG tablet Take 0.5 tablets (12.5 mg total) by mouth daily. 45 tablet 3   Varenicline Tartrate (TYRVAYA) 0.03 MG/ACT SOLN Place into the nose.     ALPRAZolam (XANAX) 0.5 MG tablet Take 0.5 mg by mouth 3 (three) times daily as needed for anxiety. (Patient not taking: Reported on 04/10/2023)     BOTOX 200 units injection Once every 4 months (Patient not taking: Reported on 04/10/2023)     Cyanocobalamin 1000 MCG/ML KIT Inject 1,000 mcg as directed every 30  (thirty) days. (Patient not taking: Reported on 02/03/2023)     nystatin-triamcinolone ointment (MYCOLOG) Apply 1 application to external vaginal surface twice daily for 7 days. (Patient not taking: Reported on 04/10/2023) 30 g 2   hydrOXYzine (ATARAX) 10 MG tablet Take 1 tablet (10 mg total) by mouth daily as needed for itching. 30 tablet  0   No facility-administered medications prior to visit.   Allergies  Allergen Reactions   Other Nausea And Vomiting and Other (See Comments)    Most narcotic pain meds cause N/V and CHEST PAIN; needs an antiemetic to tolerate  Also, patient was diagnosed with Raynaud's disease and was told to NOT place IV(s) in either hand because of poor circulation   Iodine Other (See Comments)    "Blisters"   Tape Other (See Comments)    PATIENT'S SKIN IS VERY THIN; IT TEARS, BRUISES, AND BLEEDS VERY EASILY (PLEASE USE COBAN WRAP, IF POSSIBLE!!)   Codeine Other (See Comments)    Chest pain   Fentanyl Other (See Comments)    Bad dreams   Morphine And Codeine Nausea And Vomiting     ROS: A complete ROS was performed with pertinent positives/negatives noted in the HPI. The remainder of the ROS are negative.    Objective:   Today's Vitals   04/10/23 1334  BP: 108/61  Pulse: (!) 50  SpO2: 98%  Weight: 161 lb (73 kg)  Height: 5\' 7"  (1.702 m)    Physical Exam          GENERAL: Well-appearing, in NAD. Well nourished.  SKIN: Pink, warm and dry.  Head: Normocephalic. NECK: Trachea midline. Full ROM w/o pain or tenderness.  RESPIRATORY: Chest wall symmetrical. Respirations even and non-labored. Breath sounds clear to auscultation bilaterally.  CARDIAC: S1, S2 present, regular rate and rhythm without murmur or gallops. Peripheral pulses 2+ bilaterally.  GI: Abdomen soft, non-tender. Normoactive bowel sounds. No rebound tenderness. No hepatomegaly or splenomegaly. No CVA tenderness.  - Rectal Exam: Sphincter tone intact. No obvious blood. Dark stool present. No  palpable internal hemorrhoids. No external hemorrhoid or fissures seen on exam.  MSK: Muscle tone and strength appropriate for age. Joints w/o tenderness, redness, or swelling.  EXTREMITIES: Without clubbing, cyanosis, or edema.  NEUROLOGIC: No motor or sensory deficits. Steady, even gait. C2-C12 intact.  PSYCH/MENTAL STATUS: Alert, oriented x 3. Cooperative, appropriate mood and affect.   Exam Chaperoned by: Carleene Mains, CMA     Results for orders placed or performed in visit on 04/10/23  POCT URINALYSIS DIP (CLINITEK)  Result Value Ref Range   Color, UA yellow yellow   Clarity, UA clear clear   Glucose, UA negative negative mg/dL   Bilirubin, UA negative negative   Ketones, POC UA negative negative mg/dL   Spec Grav, UA 5.784 6.962 - 1.025   Blood, UA small (A) negative   pH, UA 5.5 5.0 - 8.0   POC PROTEIN,UA =30 (A) negative, trace   Urobilinogen, UA 0.2 0.2 or 1.0 E.U./dL   Nitrite, UA Negative Negative   Leukocytes, UA Negative Negative  POCT Occult Blood Stool  Result Value Ref Range   Fecal Occult Blood, POC Negative Negative   Card #1 Date     Card #2 Fecal Occult Blod, POC     Card #2 Date     Card #3 Fecal Occult Blood, POC     Card #3 Date      The ASCVD Risk score (Arnett DK, et al., 2019) failed to calculate for the following reasons:   Cannot find a previous HDL lab   Cannot find a previous total cholesterol lab     Assessment & Plan:  1. Age-related osteoporosis without current pathological fracture (Primary) Patient desiring to switch to Fosamax oral bisphosphonate for osteoporosis management.  We discussed that this would not provide benefit as she  has been on Reclast for at least the past year and has progression of osteoporosis.  Recommend patient is a candidate for Prolia and patient was agreeable to discuss with osteoporosis clinic.  Referral placed and patient to be called to schedule. - Ambulatory referral to Orthopedics  2. Acute cystitis with  hematuria Urine positive for blood and protein.  Will send in Macrobid 100 mg twice daily as patient is having significant relief with this use of antibiotic.  Discussed safe use of antibiotics with patient and recommend follow-up with PCP if no improvement or worsening in 48 hours.  Increase clear fluids. - POCT URINALYSIS DIP (CLINITEK) - nitrofurantoin, macrocrystal-monohydrate, (MACROBID) 100 MG capsule; Take 1 capsule (100 mg total) by mouth 2 (two) times daily.  Dispense: 10 capsule; Refill: 0  3. Dark stools Guaiac test negative.  Likely darkening of stool due to overuse of bismuth salicylate AKA Pepto-Bismol.  Recommend patient decrease usage and reach out to pain management regarding control of pain medication.  If stool alteration does not improve with stopping of Pepto-Bismol, reach out to PCP.  No signs of acute bleeding at this time. - POCT Occult Blood Stool   Meds ordered this encounter  Medications   nitrofurantoin, macrocrystal-monohydrate, (MACROBID) 100 MG capsule    Sig: Take 1 capsule (100 mg total) by mouth 2 (two) times daily.    Dispense:  10 capsule    Refill:  0    Supervising Provider:   DE Peru, RAYMOND J [[id number]]   Lab Orders         POCT URINALYSIS DIP (CLINITEK)         POCT Occult Blood Stool      Return in about 3 months (around 07/10/2023) for Follow up Chronic Conditions .    Patient to reach out to office if new, worrisome, or unresolved symptoms arise or if no improvement in patient's condition. Patient verbalized understanding and is agreeable to treatment plan. All questions answered to patient's satisfaction.    Hilbert Bible, [place]

## 2023-04-10 NOTE — Patient Instructions (Signed)
 Speak to Dr Raj Janus office:  Let them know you are having stool discoloration due to amount of Pepto bismol and pain medication you are taking per PCP. They need to adjust medication to help with pain control, symptoms, and sleep.

## 2023-04-11 LAB — HEMOCCULT GUIAC POC 1CARD (OFFICE): Fecal Occult Blood, POC: NEGATIVE

## 2023-04-12 DIAGNOSIS — M542 Cervicalgia: Secondary | ICD-10-CM | POA: Diagnosis not present

## 2023-04-12 DIAGNOSIS — G894 Chronic pain syndrome: Secondary | ICD-10-CM | POA: Diagnosis not present

## 2023-04-12 DIAGNOSIS — M47812 Spondylosis without myelopathy or radiculopathy, cervical region: Secondary | ICD-10-CM | POA: Diagnosis not present

## 2023-04-12 DIAGNOSIS — M62838 Other muscle spasm: Secondary | ICD-10-CM | POA: Diagnosis not present

## 2023-04-24 ENCOUNTER — Other Ambulatory Visit (HOSPITAL_BASED_OUTPATIENT_CLINIC_OR_DEPARTMENT_OTHER): Payer: Self-pay | Admitting: Family Medicine

## 2023-04-24 ENCOUNTER — Other Ambulatory Visit: Payer: Self-pay | Admitting: Physician Assistant

## 2023-04-24 ENCOUNTER — Ambulatory Visit: Admitting: Physician Assistant

## 2023-04-24 ENCOUNTER — Encounter: Payer: Self-pay | Admitting: Physician Assistant

## 2023-04-24 VITALS — Ht 67.0 in | Wt 164.0 lb

## 2023-04-24 DIAGNOSIS — N3001 Acute cystitis with hematuria: Secondary | ICD-10-CM

## 2023-04-24 DIAGNOSIS — M810 Age-related osteoporosis without current pathological fracture: Secondary | ICD-10-CM | POA: Diagnosis not present

## 2023-04-24 MED ORDER — LEVOTHYROXINE SODIUM 100 MCG PO TABS: 100.0000 ug | ORAL_TABLET | Freq: Every day | ORAL | 0 refills | Status: DC

## 2023-04-24 NOTE — Progress Notes (Signed)
 Office Visit Note   Patient: [name]           Date of Birth: 12-08-1955           MRN: [id number] Visit Date: 04/24/2023              Requested by: Hilbert Bible, FNP [address] [place],  [place] [address] PCP: Hilbert Bible, FNP   Assessment & Plan: Visit Diagnoses: Osteoporosis  Plan: [name] is a pleasant 68 year old woman who was referred for evaluation of osteoporosis.  She has been on Fosamax and infusion in the past.  She takes calcium and vitamin D daily though has not had her vitamin D checked recently.  She has fractured her wrist but once was as a child.  She does have a history of heart disease but it does not have had a heart attack or stroke in the past year.  She has a history of breast cancer which was treated with lumpectomy and radiation treatment.  She has no history of kidney disease ulcers or reflux.  She had went through menopause at age 36 she takes about 1200 mg of calcium and vitamin D with a calcium.  She is not a smoker she does not drink.  She does exercise stating that it is usually walking a mile with her dog no family history of fragility fractures she has had no major dental work.  She has had no history of stroke or heart disease heart attack in the last year.  She would be a good candidate for Evenity followed by Prolia we will give her information.  Her FRAX score is 32% chance of a major osteoporotic fracture in the next 10 years and 12% for a hip fracture over 45 minutes was spent with her reviewing treatment options as well as discussing lifestyle changes she was given information about  Follow-Up Instructions: Will call for approval  Orders:  No orders of the defined types were placed in this encounter.  No orders of the defined types were placed in this encounter.     Procedures: No procedures performed   Clinical Data: No additional findings.   Subjective: Chief Complaint  Patient presents  with   Osteoporosis    Patient in today for Osteoporosis evaluation     HPI Pleasant 68 year old woman who comes in today for referral after having a bone density scan of -3.3.  She has had wrist fracture she has had multiple spine fusions Review of Systems   Objective: Vital Signs: Ht 5\' 7"  (1.702 m)   Wt 164 lb (74.4 kg)   BMI 25.69 kg/m   Physical Exam  Ortho Exam  Specialty Comments:  No specialty comments available.  Imaging: No results found.   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   Rosacea 11/05/2019   Scar 11/05/2019   Biventricular cardiac pacemaker in situ 10/08/2018   Long term current use of anticoagulant therapy 10/23/2017   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   Trigeminal neuralgia 12/14/2012   Insomnia, persistent 11/03/2011  AICD (automatic cardioverter/defibrillator) present    Aortic regurgitation    Nonischemic cardiomyopathy (HCC) 01/21/2011   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/17/2010   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   Esophagitis, reflux 08/24/2010   Idiopathic peripheral neuropathy 08/24/2010   Chronic systolic CHF (congestive heart failure) (HCC) 02/24/2010   Paroxysmal atrial fibrillation (HCC) 02/23/2010   Hypothyroidism    History of migraine headaches    Left bundle branch block    Idiopathic scoliosis and kyphoscoliosis     Class: Chronic   Lumbar canal stenosis 12/25/2009   Menopause 04/15/2009   Anxiety state  07/24/2008   Avitaminosis D 03/21/2008   Atypical migraine 01/30/2008   Benign essential HTN 01/30/2008   Chronic migraine w/o aura w/o status migrainosus, not intractable 01/30/2008   Paroxysmal digital cyanosis 02/15/2007   Raynaud's phenomenon 02/15/2007   Past Medical History:  Diagnosis Date   AF (paroxysmal atrial fibrillation) (HCC)    Arthritis    BCC (basal cell carcinoma) 03/11/2003   left distal lower leg ankle-tx dr Danella Deis   Biventricular cardiac pacemaker in situ    a. prior CRT-D in 2013 with downgrade to CRT-Pacemaker only in 11/2016.   Breast cancer (HCC) 12/12/2019   Chronic systolic CHF (congestive heart failure) (HCC)    Complication of anesthesia    aspiration   Essential tremor    GERD (gastroesophageal reflux disease)    otc   Hypertension    Hypothyroidism    LBBB (left bundle branch block)    conduction disorder   Migraines    Mild aortic insufficiency    NICM (nonischemic cardiomyopathy) (HCC)    a. 2012: normal coronaries, EF 25-30%. b. improved to 55% 11/2016.   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   in situ- right lower leg-anterior (CX35FU)   Scoliosis     Family History  Problem Relation Age of Onset   Heart disease Mother    Heart failure Mother 59       CHF   Stroke Mother    Hypertension Mother    Heart disease Father 35   Macular degeneration Father    Heart attack Sister    Heart disease Sister    Atrial fibrillation Sister    Heart disease Sister     Past Surgical History:  Procedure Laterality Date   ARTERY BIOPSY  12/29/2011   Procedure: MINOR BIOPSY TEMPORAL ARTERY;  Surgeon: Mariella Saa, MD;  Location: [facility];  Service: General;  Laterality: Right;  Right temporal artery biopsy    BACK SURGERY  12/31/2009   "for flat back syndrome; fused bottom of my back"   BI-VENTRICULAR IMPLANTABLE CARDIOVERTER DEFIBRILLATOR Left 01/20/2011   Procedure: BI-VENTRICULAR IMPLANTABLE CARDIOVERTER DEFIBRILLATOR  (CRT-D);  Surgeon: Marinus Maw, MD;  Location: [facility] CATH LAB;  Service: Cardiovascular;  Laterality: Left;   BIV ICD GENERATOR CHANGEOUT N/A 11/24/2016   Procedure: BIV ICD GENERATOR CHANGEOUT;  Surgeon: Marinus Maw, MD;  Location[facility] INVASIVE CV LAB;  Service: Cardiovascular;  Laterality: N/A;   BREAST LUMPECTOMY WITH RADIOACTIVE SEED LOCALIZATION Left 01/22/2020   Procedure: LEFT BREAST LUMPECTOMY WITH RADIOACTIVE SEED LOCALIZATION;  Surgeon: Manus Rudd, MD;  Location: [facility];  Service: General;  Laterality: Left;  LMA   CERVICAL ABLATION  03/29/2023   CHOLECYSTECTOMY  ~1996   FOOT NEUROMA SURGERY  ~ 2003   right   RE-EXCISION OF BREAST LUMPECTOMY Left 03/03/2020   Procedure: RE-EXCISION MARGINS OF LEFT BREAST LUMPECTOMY;  Surgeon: Dareen Ebbing, MD;  Location: [facility];  Service: General;  Laterality: Left;   ROTATOR CUFF REPAIR  ~ 2009   left   SHOULDER ARTHROSCOPY W/ ROTATOR CUFF REPAIR Right 09/21/2011   SKIN CANCER EXCISION     nose, forehead, left leg   TEE WITHOUT CARDIOVERSION N/A 03/01/2017   Procedure: TRANSESOPHAGEAL ECHOCARDIOGRAM (TEE);  Surgeon: Maudine Sos, MD;  Location: [facility] ENDOSCOPY;  Service: Cardiovascular;  Laterality: N/A;   THYROID LOBECTOMY  ~ 2006   right   TUBAL LIGATION  ~ 1983   VAGINAL HYSTERECTOMY  ~ 2009   VESICOVAGINAL FISTULA CLOSURE W/ TAH     Social History   Occupational History   Occupation: Runner, broadcasting/film/video    Comment: n/a  Tobacco Use   Smoking status: Never    Passive exposure: Never   Smokeless tobacco: Never  Vaping Use   Vaping status: Never Used  Substance and Sexual Activity   Alcohol use: No   Drug use: No   Sexual activity: Yes

## 2023-04-24 NOTE — Addendum Note (Signed)
 Addended by: Georgann Kim on: 04/24/2023 10:19 AM   Modules accepted: Orders

## 2023-04-25 LAB — EXTRA SPECIMEN

## 2023-04-25 LAB — HOUSE ACCOUNT TRACKING

## 2023-04-25 LAB — VITAMIN D 25 HYDROXY (VIT D DEFICIENCY, FRACTURES)

## 2023-04-26 DIAGNOSIS — G603 Idiopathic progressive neuropathy: Secondary | ICD-10-CM | POA: Diagnosis not present

## 2023-04-26 DIAGNOSIS — G43711 Chronic migraine without aura, intractable, with status migrainosus: Secondary | ICD-10-CM | POA: Diagnosis not present

## 2023-04-26 DIAGNOSIS — G518 Other disorders of facial nerve: Secondary | ICD-10-CM | POA: Diagnosis not present

## 2023-04-26 DIAGNOSIS — R251 Tremor, unspecified: Secondary | ICD-10-CM | POA: Diagnosis not present

## 2023-04-26 DIAGNOSIS — I7300 Raynaud's syndrome without gangrene: Secondary | ICD-10-CM | POA: Diagnosis not present

## 2023-04-26 DIAGNOSIS — F419 Anxiety disorder, unspecified: Secondary | ICD-10-CM | POA: Diagnosis not present

## 2023-04-26 DIAGNOSIS — M5412 Radiculopathy, cervical region: Secondary | ICD-10-CM | POA: Diagnosis not present

## 2023-04-26 DIAGNOSIS — G2581 Restless legs syndrome: Secondary | ICD-10-CM | POA: Diagnosis not present

## 2023-04-26 DIAGNOSIS — G5603 Carpal tunnel syndrome, bilateral upper limbs: Secondary | ICD-10-CM | POA: Diagnosis not present

## 2023-05-09 ENCOUNTER — Encounter: Payer: Self-pay | Admitting: Dermatology

## 2023-05-09 ENCOUNTER — Ambulatory Visit: Admitting: Dermatology

## 2023-05-09 VITALS — BP 103/61 | HR 50

## 2023-05-09 DIAGNOSIS — L814 Other melanin hyperpigmentation: Secondary | ICD-10-CM | POA: Diagnosis not present

## 2023-05-09 DIAGNOSIS — Z1283 Encounter for screening for malignant neoplasm of skin: Secondary | ICD-10-CM

## 2023-05-09 DIAGNOSIS — D229 Melanocytic nevi, unspecified: Secondary | ICD-10-CM

## 2023-05-09 DIAGNOSIS — L578 Other skin changes due to chronic exposure to nonionizing radiation: Secondary | ICD-10-CM

## 2023-05-09 DIAGNOSIS — L821 Other seborrheic keratosis: Secondary | ICD-10-CM

## 2023-05-09 DIAGNOSIS — L579 Skin changes due to chronic exposure to nonionizing radiation, unspecified: Secondary | ICD-10-CM | POA: Diagnosis not present

## 2023-05-09 DIAGNOSIS — Z85828 Personal history of other malignant neoplasm of skin: Secondary | ICD-10-CM

## 2023-05-09 DIAGNOSIS — Z419 Encounter for procedure for purposes other than remedying health state, unspecified: Secondary | ICD-10-CM | POA: Insufficient documentation

## 2023-05-09 DIAGNOSIS — L905 Scar conditions and fibrosis of skin: Secondary | ICD-10-CM

## 2023-05-09 DIAGNOSIS — L570 Actinic keratosis: Secondary | ICD-10-CM | POA: Diagnosis not present

## 2023-05-09 DIAGNOSIS — D1801 Hemangioma of skin and subcutaneous tissue: Secondary | ICD-10-CM

## 2023-05-09 DIAGNOSIS — W908XXA Exposure to other nonionizing radiation, initial encounter: Secondary | ICD-10-CM

## 2023-05-09 DIAGNOSIS — Z808 Family history of malignant neoplasm of other organs or systems: Secondary | ICD-10-CM

## 2023-05-09 MED ORDER — TRETINOIN 0.025 % EX CREA
TOPICAL_CREAM | Freq: Every day | CUTANEOUS | 4 refills | Status: AC
Start: 2023-05-09 — End: ?

## 2023-05-09 NOTE — Progress Notes (Signed)
 New Patient Visit   Subjective  [name] is a 68 y.o. female who presents for the following:  Total Body Skin Exam (TBSE)  Patient present today for new patient visit for TBSE.The patient reports she has spots, moles and lesions to be evaluated, some may be new or changing. Patient has previously been treated by a dermatologist. Patient reports she has hx of bx (SCC MOHS Completed 2019 and BCC). Patient reports family history of skin cancers (Dad and Sister). Patient reports throughout her lifetime has had  severe  sun exposure. Currently, patient reports if she has excessive sun exposure, she does apply sunscreen and/or wears protective coverings.  The following portions of the chart were reviewed this encounter and updated as appropriate: medications, allergies, medical history  Review of Systems:  No other skin or systemic complaints except as noted in HPI or Assessment and Plan.  Objective  Well appearing patient in no apparent distress; mood and affect are within normal limits.  A full examination was performed including scalp, head, eyes, ears, nose, lips, neck, chest, axillae, abdomen, back, buttocks, bilateral upper extremities, bilateral lower extremities, hands, feet, fingers, toes, fingernails, and toenails. All findings within normal limits unless otherwise noted below.   Relevant exam findings are noted in the Assessment and Plan.  Left Forehead, Left Preauricular Area, Mid Forehead, Right Forehead Erythematous thin papules/macules with gritty scale.   Assessment & Plan   LENTIGINES, SEBORRHEIC KERATOSES, HEMANGIOMAS - Benign normal skin lesions - Benign-appearing - Call for any changes  MELANOCYTIC NEVI - Tan-brown and/or pink-flesh-colored symmetric macules and papules - Benign appearing on exam today - Observation - Call clinic for new or changing moles - Recommend daily use of broad spectrum spf 30+ sunscreen to sun-exposed areas.   ACTINIC DAMAGE -  Chronic condition, secondary to cumulative UV/sun exposure - diffuse scaly erythematous macules with underlying dyspigmentation - Recommend daily broad spectrum sunscreen SPF 30+ to sun-exposed areas, reapply every 2 hours as needed.  - Staying in the shade or wearing long sleeves, sun glasses (UVA+UVB protection) and wide brim hats (4-inch brim around the entire circumference of the hat) are also recommended for sun protection.  - Call for new or changing lesions.  HISTORY OF SKIN CANCER - Clear. Observe for recurrence.  - Call clinic for new or changing lesions.   - Recommend regular skin exams, daily broad-spectrum spf 30+ sunscreen use, and photoprotection.     ACTINIC KERATOSIS Exam: Erythematous thin papules/macules with gritty scale at the Middle forehead, left cheek, B/L Eyebrows  Actinic keratoses are precancerous spots that appear secondary to cumulative UV radiation exposure/sun exposure over time. They are chronic with expected duration over 1 year. A portion of actinic keratoses will progress to squamous cell carcinoma of the skin. It is not possible to reliably predict which spots will progress to skin cancer and so treatment is recommended to prevent development of skin cancer.  Recommend daily broad spectrum sunscreen SPF 30+ to sun-exposed areas, reapply every 2 hours as needed.  Recommend staying in the shade or wearing long sleeves, sun glasses (UVA+UVB protection) and wide brim hats (4-inch brim around the entire circumference of the hat). Call for new or changing lesions.  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 Current symptoms and skin changes indicate that treatment goals  have not yet been met. The patient was counseled on the importance of consistent sun protection,  including daily use of broad-spectrum sunscreen (SPF 30 or higher), wearing protective clothing, and avoiding excessive sun exposure to prevent further damage. The use of tretinoin was discussed as a key treatment for improving skin texture, fine lines, and hyperpigmentation, with instructions on proper application, potential irritation, and the importance of long-term adherence for optimal results. The patient was encouraged to continue treatment and follow up for further evaluation and possible treatment adjustments.  Facial Regimen  Treatment Plan: - Recommended washing with a gentle facial wash - Recommended applying Cerave Ultra Light Moisturizer With SPF -  We will plan to prescribe Tretinoin 0.025% to apply a pea sized amount to the entire face on 2-3 nights weekly - Recommended applying Neutrogena hydro-boost moisturizer to apply nightly  SCAR Exam: Dyspigmented smooth macule or patch On RLE  Benign-appearing.  Observation.  Call clinic for new or changing lesions. Recommend daily broad spectrum sunscreen SPF 30+, reapply every 2 hours as needed.  Treatment: Recommend Serica moisturizing scar formula cream every night or [employer] brand or Mederma silicone scar sheet every night for the first year after a scar appears to help with scar remodeling if desired. Scars remodel on their own for a full year and will gradually improve in appearance over time.  SKIN CANCER SCREENING PERFORMED TODAY AK (ACTINIC KERATOSIS) (4) Left Forehead, Left Preauricular Area, Mid Forehead, Right Forehead Destruction of lesion - Left Forehead, Left Preauricular Area, Mid Forehead, Right Forehead Complexity: simple   Destruction method: cryotherapy   Informed consent: discussed and consent obtained   Timeout:  patient name, date of birth, surgical site, and procedure verified Cryotherapy cycles:  4 Outcome: patient tolerated procedure well with no complications   Post-procedure details: wound care  instructions given   Related Medications tretinoin (RETIN-A) 0.025 % cream Apply topically at bedtime. Apply to face on Monday and Thursday NIGHTS LENTIGINES   SEBORRHEIC KERATOSES   CHERRY ANGIOMA   MULTIPLE BENIGN NEVI   ACTINIC SKIN DAMAGE   SCAR   PHOTOAGING OF SKIN   Related Medications tretinoin (RETIN-A) 0.025 % cream Apply topically at bedtime. Apply to face on Monday and Thursday NIGHTS  Return in about 1 year (around 05/08/2024) for TBSE.   Documentation: I have reviewed the above documentation for accuracy and completeness, and I agree with the above.  Deneise Finlay, MD

## 2023-05-09 NOTE — Patient Instructions (Addendum)

## 2023-05-10 ENCOUNTER — Other Ambulatory Visit: Payer: Self-pay

## 2023-05-10 ENCOUNTER — Ambulatory Visit: Attending: Physician Assistant

## 2023-05-10 DIAGNOSIS — M25512 Pain in left shoulder: Secondary | ICD-10-CM | POA: Diagnosis not present

## 2023-05-10 DIAGNOSIS — R293 Abnormal posture: Secondary | ICD-10-CM | POA: Insufficient documentation

## 2023-05-10 DIAGNOSIS — M810 Age-related osteoporosis without current pathological fracture: Secondary | ICD-10-CM | POA: Diagnosis not present

## 2023-05-10 DIAGNOSIS — M6281 Muscle weakness (generalized): Secondary | ICD-10-CM | POA: Insufficient documentation

## 2023-05-10 DIAGNOSIS — R252 Cramp and spasm: Secondary | ICD-10-CM | POA: Diagnosis not present

## 2023-05-10 DIAGNOSIS — G8929 Other chronic pain: Secondary | ICD-10-CM | POA: Diagnosis not present

## 2023-05-10 DIAGNOSIS — R262 Difficulty in walking, not elsewhere classified: Secondary | ICD-10-CM | POA: Insufficient documentation

## 2023-05-10 DIAGNOSIS — M25612 Stiffness of left shoulder, not elsewhere classified: Secondary | ICD-10-CM | POA: Insufficient documentation

## 2023-05-10 NOTE — Therapy (Signed)
 OUTPATIENT PHYSICAL THERAPY CERVICAL EVALUATION   Patient Name: [name] MRN: [id number] DOB:11/29/1955, 68 y.o., female Today's Date: 05/10/2023  END OF SESSION:  PT End of Session - 05/10/23 1234     Visit Number 1    Date for PT Re-Evaluation 07/05/23    Authorization Type [employer]    PT Start Time 1226    PT Stop Time 1310    PT Time Calculation (min) 44 min    Activity Tolerance Patient tolerated treatment well    Behavior During Therapy WFL for tasks assessed/performed             Past Medical History:  Diagnosis Date   AF (paroxysmal atrial fibrillation) (HCC)    Arthritis    BCC (basal cell carcinoma) 03/11/2003   left distal lower leg ankle-tx dr Alanda Allegra   Biventricular cardiac pacemaker in situ    a. prior CRT-D in 2013 with downgrade to CRT-Pacemaker only in 11/2016.   Breast cancer (HCC) 12/12/2019   Chronic systolic CHF (congestive heart failure) (HCC)    Complication of anesthesia    aspiration   Essential tremor    GERD (gastroesophageal reflux disease)    otc   Hypertension    Hypothyroidism    LBBB (left bundle branch block)    conduction disorder   Migraines    Mild aortic insufficiency    NICM (nonischemic cardiomyopathy) (HCC)    a. 2012: normal coronaries, EF 25-30%. b. improved to 55% 11/2016.   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   in situ- right lower leg-anterior ([id number])   Scoliosis    Past Surgical History:  Procedure Laterality Date   ARTERY BIOPSY  12/29/2011   Procedure: MINOR BIOPSY TEMPORAL ARTERY;  Surgeon: Quitman Bucy, MD;  Location: [facility];  Service: General;  Laterality: Right;  Right temporal artery biopsy   BACK SURGERY  12/31/2009   "for flat back syndrome; fused  bottom of my back"   BI-VENTRICULAR IMPLANTABLE CARDIOVERTER DEFIBRILLATOR Left 01/20/2011   Procedure: BI-VENTRICULAR IMPLANTABLE CARDIOVERTER DEFIBRILLATOR  (CRT-D);  Surgeon: Tammie Fall, MD;  Location[facility] CATH LAB;  Service: Cardiovascular;  Laterality: Left;   BIV ICD GENERATOR CHANGEOUT N/A 11/24/2016   Procedure: BIV ICD GENERATOR CHANGEOUT;  Surgeon: Tammie Fall, MD;  Location: [facility] INVASIVE CV LAB;  Service: Cardiovascular;  Laterality: N/A;   BREAST LUMPECTOMY WITH RADIOACTIVE SEED LOCALIZATION Left 01/22/2020   Procedure: LEFT BREAST LUMPECTOMY WITH RADIOACTIVE SEED LOCALIZATION;  Surgeon: Dareen Ebbing, MD;  Location: [facility];  Service: General;  Laterality: Left;  LMA   CERVICAL ABLATION  03/29/2023   CHOLECYSTECTOMY  ~1996   FOOT NEUROMA SURGERY  ~ 2003   right   RE-EXCISION OF BREAST LUMPECTOMY Left 03/03/2020   Procedure: RE-EXCISION MARGINS OF LEFT BREAST LUMPECTOMY;  Surgeon: Dareen Ebbing, MD;  Location: [facility];  Service: General;  Laterality: Left;   ROTATOR CUFF REPAIR  ~ 2009   left   SHOULDER ARTHROSCOPY W/ ROTATOR CUFF REPAIR Right 09/21/2011   SKIN CANCER EXCISION     nose, forehead, left leg   TEE WITHOUT CARDIOVERSION N/A 03/01/2017   Procedure: TRANSESOPHAGEAL ECHOCARDIOGRAM (TEE);  Surgeon: Maudine Sos, MD;  Location: [facility] ENDOSCOPY;  Service: Cardiovascular;  Laterality: N/A;   THYROID  LOBECTOMY  ~ 2006   right   TUBAL LIGATION  ~ 1983   VAGINAL HYSTERECTOMY  ~ 2009   VESICOVAGINAL FISTULA CLOSURE W/ TAH     Patient Active Problem List   Diagnosis Date Noted   Encounter for procedure for purposes other than remedying health state, unspecified 05/09/2023   Cellulitis of right lower extremity 02/03/2023   Delayed healing of traumatic wound 02/03/2023   Biventricular ICD (implantable cardioverter-defibrillator) in place    DOE (dyspnea on exertion) 04/07/2021   Age-related osteoporosis without current  pathological fracture 11/29/2020   Atrophy of vagina 11/29/2020   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   Breast CA (HCC) 02/18/2020   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   Rosacea 11/05/2019   Scar 11/05/2019   Biventricular cardiac pacemaker in situ 10/08/2018   Long term current use of anticoagulant therapy 10/23/2017   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   Trigeminal neuralgia 12/14/2012   Insomnia, persistent 11/03/2011   AICD (automatic cardioverter/defibrillator) present    Aortic regurgitation    Nonischemic cardiomyopathy (HCC) 01/21/2011   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/17/2010   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   Esophagitis, reflux 08/24/2010   Idiopathic peripheral neuropathy 08/24/2010   Chronic systolic CHF (congestive heart failure) (HCC) 02/24/2010   Paroxysmal atrial fibrillation (HCC) 02/23/2010   Hypothyroidism    History of migraine headaches    Left bundle branch block    Idiopathic scoliosis and kyphoscoliosis     Class: Chronic   Lumbar canal stenosis 12/25/2009   Menopause 04/15/2009   Anxiety state 07/24/2008   Avitaminosis D 03/21/2008   Atypical migraine 01/30/2008   Benign essential HTN 01/30/2008   Chronic migraine w/o aura w/o status migrainosus, not intractable 01/30/2008   Paroxysmal digital cyanosis 02/15/2007   Raynaud's phenomenon 02/15/2007    PCP: Persons, Norma Beckers, [place]  REFERRING PROVIDER: Persons, Norma Beckers, [place]  REFERRING DIAG: M81.0 (ICD-10-CM) - Age-related osteoporosis without current pathological fracture  THERAPY DIAG:  Difficulty in walking, not elsewhere classified  Cramp and spasm  Muscle weakness (generalized)  Abnormal posture  Stiffness of left shoulder, not elsewhere classified  Chronic left shoulder pain  Rationale for Evaluation and Treatment: Rehabilitation  ONSET DATE: 04/24/2023  SUBJECTIVE:                                                                                                                                                                                                          SUBJECTIVE STATEMENT: This  is patient's second episode recently.  She reports she continues to have neck pain and left shoulder pain and has been diagnosed with osteoporosis.  She has not done her exercises from last episode of PT.  She was afraid that they were hurting her.  She expresses concern over having any invasive treatment due to a bad experience in her past.  She has pacemaker as well.  She hopes to gain strength and reduce her overall pain.   Hand dominance: Right  PERTINENT HISTORY:  Long hx of neck and left shoulder pain, has been advised to have left shoulder replacement  PAIN:  Are you having pain? Yes: NPRS scale: 4/10 currently,  8/10 at worst Pain location: neck Pain description: aching, tight Aggravating factors: driving, reaching overhead Relieving factors: Not using the left arm  PRECAUTIONS: None  RED FLAGS: None     WEIGHT BEARING RESTRICTIONS: No  FALLS:  Has patient fallen in last 6 months? Yes. Number of falls 1  LIVING ENVIRONMENT: Lives with: lives with their spouse Lives in: House/apartment   OCCUPATION: retired  PLOF: Independent, Independent with basic ADLs, Independent with household mobility without device, Independent with community mobility without device, Independent with homemaking with ambulation, Independent with gait, and Independent with transfers  PATIENT GOALS: to eliminate pain  NEXT MD VISIT: prn  OBJECTIVE:  Note: Objective measures were completed at Evaluation unless otherwise noted.  DIAGNOSTIC FINDINGS:  IMPRESSION: 1. Fused thoracic scoliosis with compensatory cervical curvature and multilevel degeneration. 2. Moderate foraminal impingement bilaterally at C5-6 and C6-7, similar to 08/30/2019 cervical MRI. The spinal canal appears  widely patent.  COGNITION: Overall cognitive status: Within functional limits for tasks assessed  SENSATION: WFL  POSTURE: rounded shoulders, forward head, and thoracic fusion and scoliosis  PALPATION: Severe fascial restriction and trigger points bilateral upper traps   CERVICAL ROM:   Active ROM A/PROM (deg) eval  Flexion 30  Extension 20  Right lateral flexion 12  Left lateral flexion 8  Right rotation 30  Left rotation 20   (Blank rows = not tested)  UPPER EXTREMITY ROM:  Left UE significantly restricted due to end stage OA.  Provider has suggested shoulder replacement.   UPPER EXTREMITY MMT:  MMT Right eval Left eval  Shoulder flexion    Shoulder extension    Shoulder abduction    Shoulder adduction    Shoulder extension    Shoulder internal rotation    Shoulder external rotation    Middle trapezius    Lower trapezius    Elbow flexion    Elbow extension    Wrist flexion    Wrist extension    Wrist ulnar deviation    Wrist radial deviation    Wrist pronation    Wrist supination    Grip strength     (Blank rows = not tested)   FUNCTIONAL TESTS:  5 times sit to stand: 10.51 sec Timed up and go (TUG): 9.49 sec   TREATMENT DATE: 05/10/23 Initial eval completed Education on end stage OA and how this contributes to her neck pain,  educated on anatomy of the shoulder and c spine, educated on limits of PT when there are end stage degenerative changes.   MD has suggested shoulder replacement and that she will likely continue to struggle with the pain since we focused on this last episode and she did not see significant improvement.  PATIENT EDUCATION:  Education details: See above Person educated: Patient Education method: Explanation Education comprehension: verbalized understanding  HOME EXERCISE PROGRAM: Begin next visjt,  spent most of session educating and discussing options  ASSESSMENT:  CLINICAL IMPRESSION: Patient is a 68 y.o. female who was seen today for physical therapy evaluation and treatment for osteoporosis.  She presents with bilateral shoulder and multi joint decreased ROM, decreased overall strength and elevated pain.  She would benefit from skilled PT to focus on resistive and weight bearing exercises to promote bone health along with gaining some control of her pain.  Suggested to patient see orthopedist for consult about her shoulder.   OBJECTIVE IMPAIRMENTS: Abnormal gait, cardiopulmonary status limiting activity, decreased balance, decreased endurance, difficulty walking, decreased ROM, decreased strength, hypomobility, increased fascial restrictions, increased muscle spasms, impaired flexibility, postural dysfunction, and pain.   ACTIVITY LIMITATIONS: carrying, lifting, bending, sitting, standing, squatting, sleeping, stairs, transfers, bed mobility, continence, bathing, toileting, dressing, reach over head, hygiene/grooming, and caring for others  PARTICIPATION LIMITATIONS: meal prep, cleaning, laundry, driving, shopping, and community activity  PERSONAL FACTORS: Behavior pattern, Fitness, Past/current experiences, Time since onset of injury/illness/exacerbation, and 3+ comorbidities: scoliosis, htn, pacemaker, and CHF  are also affecting patient's functional outcome.   REHAB POTENTIAL: Fair due to recent episode and still opting to decline surgery  CLINICAL DECISION MAKING: Unstable/unpredictable  EVALUATION COMPLEXITY: High   GOALS: Goals reviewed with patient? Yes  SHORT TERM GOALS: Target date: 06/07/2023  Pain report to be no greater than 4/10  Baseline:  Goal status: INITIAL  2.  Patient will be independent with initial HEP  Baseline:  Goal status: INITIAL   LONG TERM GOALS: Target date: 07/05/2023  Patient to report pain no greater than 2/10  Baseline:  Goal status:  INITIAL  2.  Patient to be independent with advanced HEP  Baseline:  Goal status: INITIAL  3.  Patient to demonstrate consistency with some level of exercise Baseline:  Goal status: INITIAL  4.  C spine ROM to improve by 2-3 degrees each direction Baseline:  Goal status: INITIAL  5.  Patient to be able to stand or walk for at least 15 min without leg pain  Baseline:  Goal status: INITIAL  6.  Patient to have consult with orthopedic provider Baseline:  Goal status: INITIAL   PLAN:  PT FREQUENCY: 1-2x/week  PT DURATION: 8 weeks  PLANNED INTERVENTIONS: 97110-Therapeutic exercises, 97530- Therapeutic activity, [id number]- Neuromuscular re-education, 97535- Self Care, [id number]- Manual therapy, [phone]- Gait training, [phone]- Canalith repositioning, [id number]- Aquatic Therapy, [phone]- Electrical stimulation (unattended), [phone]- Electrical stimulation (manual), [id number]- Vasopneumatic device, [id number]- Ultrasound, [id number]- Ionotophoresis 4mg /ml Dexamethasone , Patient/Family education, Balance training, Stair training, Taping, Dry Needling, Joint mobilization, Spinal mobilization, Vestibular training, Visual/preceptual remediation/compensation, Cryotherapy, and Moist heat  PLAN FOR NEXT SESSION: Nustep, begin resistive and weight bearing training   Zunaira Lamy B. Lawson Mahone, PT 05/10/23 10:14 PM [facility] [address], [address] [place], [place] [address] Phone # [phone] Fax [phone]

## 2023-05-12 ENCOUNTER — Ambulatory Visit: Attending: Physician Assistant | Admitting: Physical Therapy

## 2023-05-12 DIAGNOSIS — M6281 Muscle weakness (generalized): Secondary | ICD-10-CM | POA: Insufficient documentation

## 2023-05-12 DIAGNOSIS — M25612 Stiffness of left shoulder, not elsewhere classified: Secondary | ICD-10-CM | POA: Insufficient documentation

## 2023-05-12 DIAGNOSIS — R293 Abnormal posture: Secondary | ICD-10-CM | POA: Insufficient documentation

## 2023-05-12 DIAGNOSIS — R262 Difficulty in walking, not elsewhere classified: Secondary | ICD-10-CM | POA: Diagnosis not present

## 2023-05-12 DIAGNOSIS — M25512 Pain in left shoulder: Secondary | ICD-10-CM | POA: Insufficient documentation

## 2023-05-12 DIAGNOSIS — R252 Cramp and spasm: Secondary | ICD-10-CM | POA: Diagnosis not present

## 2023-05-12 DIAGNOSIS — G8929 Other chronic pain: Secondary | ICD-10-CM | POA: Insufficient documentation

## 2023-05-12 NOTE — Patient Instructions (Signed)
 OSTEOPOROSIS   Significant research has been done over the last few years that have made great strides in osteoporosis recommendations.  A few key concepts about bone loading: 1) be specific and target those areas of lowest T scores like the femoral neck or lumbar spine 2) be progressive and patient. It takes 6-8 months to make a change in bones 3)  the areas that have the worst T score benefit the most and the biggest improvements are seen! 4)  Even if you stop exercise, there will be some decline although some benefits will maintain  POWER LIFTING: One study called the LIFTMOR study really expands on the benefit of  lifting heavier loads:  dead lifting, carries   PROGRESSIVE RESISTANCE TRAINING: can be your own body weight against gravity, elastic bands or weights for resistance.  A basic muscle strengthening program can include:   Squat, lunge, hinge or bridge exercises to improve leg strength Push, pull and press exercises for upper body and shoulder muscles such as pull downs, rows and counter or floor push ups Planks, side planks and bird dog exercises to target abdominal and back extensor muscles and improve posture   IMPACT EXERCISE:  a study in 2019 measured ground reactive forces relative to bodyweight to find which exercises stimulated the bone the most.  Some of the most surprisingly high beneficial forces are: Heel drops Hopping Foot Stomping Side to side jump over rope   BALANCE:  balance exercises involve staying steady during movements that make you unstable. You should practice: Leaning forward, backward or side to side Unusual walking or dance patterns, such as walking heel to toe or sideways or using an agility ladder Reacting to things that upset your balance, like stopping or changing directions Tai Chi  Practice exercises that improve functional abilities such as: Sit to  stands or squats Stair climbing or toe taps on a step   Aim for 2-3 times per week and increase difficulty over time.

## 2023-05-12 NOTE — Therapy (Signed)
 OUTPATIENT PHYSICAL THERAPY CERVICAL PROGRESS   Patient Name: [name] MRN: [id number] DOB:Oct 07, 1955, 68 y.o., female Today's Date: 05/12/2023  END OF SESSION:  PT End of Session - 05/12/23 0931     Visit Number 2    Date for PT Re-Evaluation 07/05/23    Authorization Type [employer] 16 visits  4/30-6/25    PT Start Time 0930    PT Stop Time 1012    PT Time Calculation (min) 42 min    Activity Tolerance Patient tolerated treatment well             Past Medical History:  Diagnosis Date   AF (paroxysmal atrial fibrillation) (HCC)    Arthritis    BCC (basal cell carcinoma) 03/11/2003   left distal lower leg ankle-tx dr Alanda Allegra   Biventricular cardiac pacemaker in situ    a. prior CRT-D in 2013 with downgrade to CRT-Pacemaker only in 11/2016.   Breast cancer (HCC) 12/12/2019   Chronic systolic CHF (congestive heart failure) (HCC)    Complication of anesthesia    aspiration   Essential tremor    GERD (gastroesophageal reflux disease)    otc   Hypertension    Hypothyroidism    LBBB (left bundle branch block)    conduction disorder   Migraines    Mild aortic insufficiency    NICM (nonischemic cardiomyopathy) (HCC)    a. 2012: normal coronaries, EF 25-30%. b. improved to 55% 11/2016.   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   in situ- right lower leg-anterior (CX35FU)   Scoliosis    Past Surgical History:  Procedure Laterality Date   ARTERY BIOPSY  12/29/2011   Procedure: MINOR BIOPSY TEMPORAL ARTERY;  Surgeon: Quitman Bucy, MD;  Location: [facility];  Service: General;  Laterality: Right;  Right temporal artery biopsy   BACK SURGERY  12/31/2009   "for flat back syndrome; fused bottom of my back"   BI-VENTRICULAR  IMPLANTABLE CARDIOVERTER DEFIBRILLATOR Left 01/20/2011   Procedure: BI-VENTRICULAR IMPLANTABLE CARDIOVERTER DEFIBRILLATOR  (CRT-D);  Surgeon: Tammie Fall, MD;  Location: [facility] CATH LAB;  Service: Cardiovascular;  Laterality: Left;   BIV ICD GENERATOR CHANGEOUT N/A 11/24/2016   Procedure: BIV ICD GENERATOR CHANGEOUT;  Surgeon: Tammie Fall, MD;  Location: [facility] INVASIVE CV LAB;  Service: Cardiovascular;  Laterality: N/A;   BREAST LUMPECTOMY WITH RADIOACTIVE SEED LOCALIZATION Left 01/22/2020   Procedure: LEFT BREAST LUMPECTOMY WITH RADIOACTIVE SEED LOCALIZATION;  Surgeon: Dareen Ebbing, MD;  Location: [facility];  Service: General;  Laterality: Left;  LMA   CERVICAL ABLATION  03/29/2023   CHOLECYSTECTOMY  ~1996   FOOT NEUROMA SURGERY  ~ 2003   right   RE-EXCISION OF BREAST LUMPECTOMY Left 03/03/2020   Procedure: RE-EXCISION MARGINS OF LEFT BREAST LUMPECTOMY;  Surgeon: Dareen Ebbing, MD;  Location: [facility];  Service: General;  Laterality: Left;   ROTATOR CUFF REPAIR  ~ 2009   left   SHOULDER ARTHROSCOPY W/ ROTATOR CUFF REPAIR Right 09/21/2011   SKIN CANCER EXCISION     nose, forehead, left leg   TEE WITHOUT CARDIOVERSION N/A 03/01/2017   Procedure: TRANSESOPHAGEAL ECHOCARDIOGRAM (TEE);  Surgeon: Maudine Sos, MD;  Location: [facility] ENDOSCOPY;  Service: Cardiovascular;  Laterality: N/A;   THYROID  LOBECTOMY  ~  2006   right   TUBAL LIGATION  ~ 1983   VAGINAL HYSTERECTOMY  ~ 2009   VESICOVAGINAL FISTULA CLOSURE W/ TAH     Patient Active Problem List   Diagnosis Date Noted   Encounter for procedure for purposes other than remedying health state, unspecified 05/09/2023   Cellulitis of right lower extremity 02/03/2023   Delayed healing of traumatic wound 02/03/2023   Biventricular ICD (implantable cardioverter-defibrillator) in place    DOE (dyspnea on exertion) 04/07/2021   Age-related osteoporosis without current pathological fracture 11/29/2020   Atrophy  of vagina 11/29/2020   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   Breast CA (HCC) 02/18/2020   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   Rosacea 11/05/2019   Scar 11/05/2019   Biventricular cardiac pacemaker in situ 10/08/2018   Long term current use of anticoagulant therapy 10/23/2017   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   Trigeminal neuralgia 12/14/2012   Insomnia, persistent 11/03/2011   AICD (automatic cardioverter/defibrillator) present    Aortic regurgitation    Nonischemic cardiomyopathy (HCC) 01/21/2011   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/17/2010   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   Esophagitis, reflux 08/24/2010   Idiopathic peripheral neuropathy 08/24/2010   Chronic systolic CHF (congestive heart failure) (HCC) 02/24/2010   Paroxysmal atrial fibrillation (HCC) 02/23/2010   Hypothyroidism    History of migraine headaches    Left bundle branch block    Idiopathic scoliosis and kyphoscoliosis     Class: Chronic   Lumbar canal stenosis 12/25/2009   Menopause 04/15/2009   Anxiety state 07/24/2008   Avitaminosis D 03/21/2008   Atypical migraine 01/30/2008   Benign essential HTN 01/30/2008   Chronic migraine w/o aura w/o status migrainosus, not intractable 01/30/2008   Paroxysmal digital cyanosis 02/15/2007   Raynaud's phenomenon 02/15/2007    PCP: Persons, Norma Beckers, [place]  REFERRING PROVIDER: Persons, Norma Beckers, [place]  REFERRING DIAG: M81.0 (ICD-10-CM) - Age-related osteoporosis without current pathological fracture  THERAPY DIAG:  Difficulty in walking, not elsewhere classified  Muscle weakness (generalized)  Abnormal posture  Rationale for Evaluation and Treatment: Rehabilitation  ONSET DATE: 04/24/2023  SUBJECTIVE:                                                                                                                                                                                                          SUBJECTIVE STATEMENT: Will see Dr. Rozelle Corning 5/8 about neck surgery.  I'd like to work on my bones today.  I took some medication before I came.  Eval:  This is patient's second episode recently.  She reports she continues to have neck pain and left shoulder pain and has been diagnosed with osteoporosis.  She has not done her exercises from last episode of PT.  She was afraid that they were hurting her.  She expresses concern over having any invasive treatment due to a bad experience in her past.  She has pacemaker as well.  She hopes to gain strength and reduce her overall pain.   Hand dominance: Right  PERTINENT HISTORY:  Long hx of neck and left shoulder pain, has been advised to have left shoulder replacement  PAIN:  Are you having pain? Yes: NPRS scale: 7/10  Pain location: neck and shoulder  Pain description: aching, tight Aggravating factors: driving, reaching overhead Relieving factors: Not using the left arm  PRECAUTIONS: None  RED FLAGS: None     WEIGHT BEARING RESTRICTIONS: No  FALLS:  Has patient fallen in last 6 months? Yes. Number of falls 1  LIVING ENVIRONMENT: Lives with: lives with their spouse Lives in: House/apartment   OCCUPATION: retired  PLOF: Independent, Independent with basic ADLs, Independent with household mobility without device, Independent with community mobility without device, Independent with homemaking with ambulation, Independent with gait, and Independent with transfers  PATIENT GOALS: to eliminate pain  NEXT MD VISIT: prn  OBJECTIVE:  Note: Objective measures were completed at Evaluation unless otherwise noted.  DIAGNOSTIC FINDINGS:  IMPRESSION: 1. Fused thoracic scoliosis with compensatory cervical curvature and multilevel degeneration. 2. Moderate foraminal impingement bilaterally at C5-6 and C6-7, similar to 08/30/2019 cervical MRI. The spinal canal appears widely patent.  COGNITION: Overall cognitive status:  Within functional limits for tasks assessed  SENSATION: WFL  POSTURE: rounded shoulders, forward head, and thoracic fusion and scoliosis  PALPATION: Severe fascial restriction and trigger points bilateral upper traps   CERVICAL ROM:   Active ROM A/PROM (deg) eval  Flexion 30  Extension 20  Right lateral flexion 12  Left lateral flexion 8  Right rotation 30  Left rotation 20   (Blank rows = not tested)  UPPER EXTREMITY ROM:  Left UE significantly restricted due to end stage OA.  Provider has suggested shoulder replacement.   UPPER EXTREMITY MMT:  MMT Right eval Left eval  Shoulder flexion    Shoulder extension    Shoulder abduction    Shoulder adduction    Shoulder extension    Shoulder internal rotation    Shoulder external rotation    Middle trapezius    Lower trapezius    Elbow flexion    Elbow extension    Wrist flexion    Wrist extension    Wrist ulnar deviation    Wrist radial deviation    Wrist pronation    Wrist supination    Grip strength     (Blank rows = not tested)   FUNCTIONAL TESTS:  5 times sit to stand: 10.51 sec Timed up and go (TUG): 9.49 sec   TREATMENT DATE: 05/10/23 Nu-Step L3 8 min Osteoporosis education-handout given see patient instructions Impact ex's for UE (T score for left radius = osteoporosis): clapping, thigh patting  Impact ex for LE (T score for femur= osteopenia): marching, heel drops Sit to stand from mat + cushion 5x (pt able to rise without UE assist at this height) (added to HEP) Seated 2# wrist extension 10x (added to HEP) Seated 2# wrist pronation supination 10x (added to HEP) Farmer's carry both 2# weights 40 feet x2 Pt has 2# weights  at home   TREATMENT DATE: 05/10/23 Initial eval completed Education on end stage OA and how this contributes to her neck pain,  educated on anatomy of the shoulder and c spine, educated on limits of PT when there are end stage degenerative changes.   MD has suggested shoulder  replacement and that she will likely continue to struggle with the pain since we focused on this last episode and she did not see significant improvement.                                                                                                                                  PATIENT EDUCATION:  Education details: See above Person educated: Patient Education method: Explanation Education comprehension: verbalized understanding  HOME EXERCISE PROGRAM: Access Code: [email or web address] URL: [email or web address] Date: 05/12/2023 Prepared by: Darien Eden  Exercises - Sit to Stand  - 2 x daily - 7 x weekly - 1 sets - 5 reps - Wrist Extension with Dumbbell  - 1 x daily - 7 x weekly - 1 sets - 10 reps - Seated Pronation Supination with Dumbbell  - 1 x daily - 7 x weekly - 1 sets - 10 reps - Farmer's Carry with Kettlebells  - 1 x daily - 7 x weekly - 1 sets - 10 reps Begin next visjt, spent most of session educating and discussing options  ASSESSMENT:  CLINICAL IMPRESSION: Therapist educating pt on osteoporosis and instructing in targeted ex's for bone building to radius (osteoporotic) and femur (osteopenia).  Exercises carefully selected to avoid exacerbation of chronic shoulder and neck pain.  She reports pain is no worse.  Initiated HEP to include UE and LE exs.    OBJECTIVE IMPAIRMENTS: Abnormal gait, cardiopulmonary status limiting activity, decreased balance, decreased endurance, difficulty walking, decreased ROM, decreased strength, hypomobility, increased fascial restrictions, increased muscle spasms, impaired flexibility, postural dysfunction, and pain.   ACTIVITY LIMITATIONS: carrying, lifting, bending, sitting, standing, squatting, sleeping, stairs, transfers, bed mobility, continence, bathing, toileting, dressing, reach over head, hygiene/grooming, and caring for others  PARTICIPATION LIMITATIONS: meal prep, cleaning, laundry, driving, shopping, and community  activity  PERSONAL FACTORS: Behavior pattern, Fitness, Past/current experiences, Time since onset of injury/illness/exacerbation, and 3+ comorbidities: scoliosis, htn, pacemaker, and CHF  are also affecting patient's functional outcome.   REHAB POTENTIAL: Fair due to recent episode and still opting to decline surgery  CLINICAL DECISION MAKING: Unstable/unpredictable  EVALUATION COMPLEXITY: High   GOALS: Goals reviewed with patient? Yes  SHORT TERM GOALS: Target date: 06/07/2023  Pain report to be no greater than 4/10  Baseline:  Goal status: INITIAL  2.  Patient will be independent with initial HEP  Baseline:  Goal status: INITIAL   LONG TERM GOALS: Target date: 07/05/2023  Patient to report pain no greater than 2/10  Baseline:  Goal status: INITIAL  2.  Patient to be independent with advanced HEP  Baseline:  Goal status: INITIAL  3.  Patient  to demonstrate consistency with some level of exercise Baseline:  Goal status: INITIAL  4.  C spine ROM to improve by 2-3 degrees each direction Baseline:  Goal status: INITIAL  5.  Patient to be able to stand or walk for at least 15 min without leg pain  Baseline:  Goal status: INITIAL  6.  Patient to have consult with orthopedic provider Baseline:  Goal status: INITIAL   PLAN:  PT FREQUENCY: 1-2x/week  PT DURATION: 8 weeks  PLANNED INTERVENTIONS: 97110-Therapeutic exercises, 97530- Therapeutic activity, 97112- Neuromuscular re-education, [phone]- Self Care, 62130- Manual therapy, [phone]- Gait training, [phone]- Canalith repositioning, [id number]- Aquatic Therapy, [phone]- Electrical stimulation (unattended), [phone]- Electrical stimulation (manual), 97016- Vasopneumatic device, [id number]- Ultrasound, [id number]- Ionotophoresis 4mg /ml Dexamethasone , Patient/Family education, Balance training, Stair training, Taping, Dry Needling, Joint mobilization, Spinal mobilization, Vestibular training, Visual/preceptual remediation/compensation,  Cryotherapy, and Moist heat  PLAN FOR NEXT SESSION: Nustep, begin resistive and weight bearing training   Darien Eden, PT 05/12/23 12:33 PM Phone: [phone] Fax: [phone]  [facility] [address] [place], [place] [address] Phone # [phone] Fax [phone]

## 2023-05-18 ENCOUNTER — Ambulatory Visit (INDEPENDENT_AMBULATORY_CARE_PROVIDER_SITE_OTHER): Admitting: Orthopedic Surgery

## 2023-05-18 ENCOUNTER — Other Ambulatory Visit: Payer: Self-pay

## 2023-05-18 ENCOUNTER — Encounter: Payer: Self-pay | Admitting: Orthopedic Surgery

## 2023-05-18 ENCOUNTER — Encounter: Payer: Self-pay | Admitting: Nurse Practitioner

## 2023-05-18 ENCOUNTER — Other Ambulatory Visit (INDEPENDENT_AMBULATORY_CARE_PROVIDER_SITE_OTHER): Payer: Self-pay

## 2023-05-18 DIAGNOSIS — M542 Cervicalgia: Secondary | ICD-10-CM

## 2023-05-18 DIAGNOSIS — M75122 Complete rotator cuff tear or rupture of left shoulder, not specified as traumatic: Secondary | ICD-10-CM

## 2023-05-18 DIAGNOSIS — M25512 Pain in left shoulder: Secondary | ICD-10-CM | POA: Diagnosis not present

## 2023-05-18 DIAGNOSIS — G8929 Other chronic pain: Secondary | ICD-10-CM | POA: Diagnosis not present

## 2023-05-18 DIAGNOSIS — M19012 Primary osteoarthritis, left shoulder: Secondary | ICD-10-CM | POA: Diagnosis not present

## 2023-05-19 ENCOUNTER — Encounter: Payer: Self-pay | Admitting: Orthopedic Surgery

## 2023-05-19 NOTE — Progress Notes (Unsigned)
 Office Visit Note   Patient: [name]           Date of Birth: [birth year]           MRN: [id number] Visit Date: 05/18/2023 Requested by: Nonda Bays, FNP [address] [place],  [place] [address] PCP: Nonda Bays, FNP  Subjective: Chief Complaint  Patient presents with   Neck - Pain   Left Shoulder - Pain    HPI: [name] is a 68 y.o. female who presents to the office reporting neck and left shoulder pain.  She has a known left shoulder supraspinatus rotator cuff tear.  Has been there for several years.  More recently her neck started to give her significant pain.  She has had physical therapy for the neck including dry needling as well as injections as well as ablation treatment.  She did have an injection last June which helped her neck.  Cannot sleep on the left-hand side.  Describes a pulling sensation in her upper trapezial region with walking.  She also describes having some left thumb shooting pain.  Also some pain radiating from the back of the arm.  She has been taking Dilaudid  which was an old prescription from foot surgery.  Husband is with her here today..                ROS: All systems reviewed are negative as they relate to the chief complaint within the history of present illness.  Patient denies fevers or chills.  Assessment & Plan: Visit Diagnoses:  1. Chronic left shoulder pain   2. Neck pain     Plan: Impression is complex problem in the neck with significant kyphosis and lower cervical degenerative disc disease.  She has tried and failed a lot of nonoperative treatment options for her neck.  She would like to be referred to Dr. Rochelle Chu for this complex neck problem.  Surgical intervention possible.  Regarding the shoulder she does have known supraspinatus tendon tear with likely some rotator cuff arthropathy developing.  We did do an ultrasound-guided injection into that left shoulder today.  We will see her  back as needed.  Follow-Up Instructions: No follow-ups on file.   Orders:  Orders Placed This Encounter  Procedures   XR Shoulder Left   XR Cervical Spine 2 or 3 views   US  Guided Needle Placement - No Linked Charges   Ambulatory referral to Neurosurgery   No orders of the defined types were placed in this encounter.     Procedures: Large Joint Inj: L glenohumeral on 05/18/2023 11:26 PM Indications: diagnostic evaluation and pain Details: 22 G 1.5 in needle, ultrasound-guided posterior approach  Arthrogram: No  Medications: 9 mL bupivacaine  0.5 %; 5 mL lidocaine  1 % Outcome: tolerated well, no immediate complications Procedure, treatment alternatives, risks and benefits explained, specific risks discussed. Consent was given by the patient. Immediately prior to procedure a time out was called to verify the correct patient, procedure, equipment, support staff and site/side marked as required. Patient was prepped and draped in the usual sterile fashion.    Kenelog injected   Clinical Data: No additional findings.  Objective: Vital Signs: There were no vitals taken for this visit.  Physical Exam:  Constitutional: Patient appears well-developed HEENT:  Head: Normocephalic Eyes:EOM are normal Neck: Normal range of motion Cardiovascular: Normal rate Pulmonary/chest: Effort normal Neurologic: Patient is alert Skin: Skin is warm Psychiatric: Patient has normal mood and affect  Ortho  Exam: Ortho exam demonstrates mild pain with range of motion including flexion extension and rotation in the cervical spine.  Negative grind test bilaterally for CMC arthritis.  EPL FPL interosseous strength is intact.  No definite paresthesias C5-T1.  No muscle atrophy or wasting in the left or right arm.  Does have very good bicep triceps and deltoid strength.  Patient does have some crepitus with passive range of motion of the left shoulder at 90 degrees of abduction.  Passive range of motion  otherwise bilaterally is 60/95/170 on the right and 60/95/150 on the left.  No discrete AC joint tenderness bilaterally.  Specialty Comments:  No specialty comments available.  Imaging: No results found.   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   Rosacea 11/05/2019   Scar 11/05/2019   Biventricular cardiac pacemaker in situ 10/08/2018   Long term current use of anticoagulant therapy 10/23/2017   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   Trigeminal neuralgia 12/14/2012   Insomnia, persistent 11/03/2011   AICD (automatic cardioverter/defibrillator) present    Aortic regurgitation    Nonischemic cardiomyopathy (HCC) 01/21/2011   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/17/2010   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   Esophagitis, reflux 08/24/2010   Idiopathic peripheral neuropathy 08/24/2010   Chronic systolic CHF (congestive heart failure) (HCC) 02/24/2010   Paroxysmal atrial fibrillation (HCC) 02/23/2010   Hypothyroidism    History of migraine headaches    Left bundle branch block    Idiopathic scoliosis and kyphoscoliosis     Class: Chronic   Lumbar canal stenosis 12/25/2009   Menopause 04/15/2009   Anxiety state 07/24/2008   Avitaminosis D 03/21/2008   Atypical migraine 01/30/2008   Benign essential HTN 01/30/2008   Chronic migraine w/o aura w/o status migrainosus, not  intractable 01/30/2008   Paroxysmal digital cyanosis 02/15/2007   Raynaud's phenomenon 02/15/2007   Past Medical History:  Diagnosis Date   AF (paroxysmal atrial fibrillation) (HCC)    Arthritis    BCC (basal cell carcinoma) 03/11/2003   left distal lower leg ankle-tx dr Alanda Allegra   Biventricular cardiac pacemaker in situ    a. prior CRT-D in 2013 with downgrade to CRT-Pacemaker only in 11/2016.   Breast cancer (HCC) 12/12/2019   Chronic systolic CHF (congestive heart failure) (HCC)    Complication of anesthesia    aspiration   Essential tremor    GERD (gastroesophageal reflux disease)    otc   Hypertension    Hypothyroidism    LBBB (left bundle branch block)    conduction disorder   Migraines    Mild aortic insufficiency    NICM (nonischemic cardiomyopathy) (HCC)    a. 2012: normal coronaries, EF 25-30%. b. improved to 55% 11/2016.   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   right upper lip-mohs Dr.mitkov   SCC (  squamous cell carcinoma) 09/08/2020   in situ- right lower leg-anterior ([id number])   Scoliosis     Family History  Problem Relation Age of Onset   Heart disease Mother    Heart failure Mother 30       CHF   Stroke Mother    Hypertension Mother    Heart disease Father 63   Macular degeneration Father    Heart attack Sister    Heart disease Sister    Atrial fibrillation Sister    Heart disease Sister     Past Surgical History:  Procedure Laterality Date   ARTERY BIOPSY  12/29/2011   Procedure: MINOR BIOPSY TEMPORAL ARTERY;  Surgeon: Quitman Bucy, MD;  Location: [facility];  Service: General;  Laterality: Right;  Right temporal artery biopsy   BACK SURGERY  12/31/2009   "for flat back syndrome; fused bottom of my back"   BI-VENTRICULAR IMPLANTABLE CARDIOVERTER DEFIBRILLATOR Left 01/20/2011   Procedure:  BI-VENTRICULAR IMPLANTABLE CARDIOVERTER DEFIBRILLATOR  (CRT-D);  Surgeon: Tammie Fall, MD;  Location: [facility] CATH LAB;  Service: Cardiovascular;  Laterality: Left;   BIV ICD GENERATOR CHANGEOUT N/A 11/24/2016   Procedure: BIV ICD GENERATOR CHANGEOUT;  Surgeon: Tammie Fall, MD;  Location: [facility] INVASIVE CV LAB;  Service: Cardiovascular;  Laterality: N/A;   BREAST LUMPECTOMY WITH RADIOACTIVE SEED LOCALIZATION Left 01/22/2020   Procedure: LEFT BREAST LUMPECTOMY WITH RADIOACTIVE SEED LOCALIZATION;  Surgeon: Dareen Ebbing, MD;  Location: [facility];  Service: General;  Laterality: Left;  LMA   CERVICAL ABLATION  03/29/2023   CHOLECYSTECTOMY  ~1996   FOOT NEUROMA SURGERY  ~ 2003   right   RE-EXCISION OF BREAST LUMPECTOMY Left 03/03/2020   Procedure: RE-EXCISION MARGINS OF LEFT BREAST LUMPECTOMY;  Surgeon: Dareen Ebbing, MD;  Location: [facility];  Service: General;  Laterality: Left;   ROTATOR CUFF REPAIR  ~ 2009   left   SHOULDER ARTHROSCOPY W/ ROTATOR CUFF REPAIR Right 09/21/2011   SKIN CANCER EXCISION     nose, forehead, left leg   TEE WITHOUT CARDIOVERSION N/A 03/01/2017   Procedure: TRANSESOPHAGEAL ECHOCARDIOGRAM (TEE);  Surgeon: Maudine Sos, MD;  Location: [facility] ENDOSCOPY;  Service: Cardiovascular;  Laterality: N/A;   THYROID  LOBECTOMY  ~ 2006   right   TUBAL LIGATION  ~ 1983   VAGINAL HYSTERECTOMY  ~ 2009   VESICOVAGINAL FISTULA CLOSURE W/ TAH     Social History   Occupational History   Occupation: Runner, broadcasting/film/video    Comment: n/a  Tobacco Use   Smoking status: Never    Passive exposure: Never   Smokeless tobacco: Never  Vaping Use   Vaping status: Never Used  Substance and Sexual Activity   Alcohol  use: No   Drug use: No   Sexual activity: Yes

## 2023-05-21 MED ORDER — LIDOCAINE HCL 1 % IJ SOLN
5.0000 mL | INTRAMUSCULAR | Status: AC | PRN
  Administered 2023-05-18: 5 mL

## 2023-05-21 MED ORDER — BUPIVACAINE HCL 0.5 % IJ SOLN
9.0000 mL | INTRAMUSCULAR | Status: AC | PRN
Start: 2023-05-18 — End: 2023-05-18
  Administered 2023-05-18: 9 mL via INTRA_ARTICULAR

## 2023-05-22 ENCOUNTER — Ambulatory Visit (INDEPENDENT_AMBULATORY_CARE_PROVIDER_SITE_OTHER)

## 2023-05-22 DIAGNOSIS — R293 Abnormal posture: Secondary | ICD-10-CM | POA: Diagnosis not present

## 2023-05-22 DIAGNOSIS — R252 Cramp and spasm: Secondary | ICD-10-CM | POA: Diagnosis not present

## 2023-05-22 DIAGNOSIS — R262 Difficulty in walking, not elsewhere classified: Secondary | ICD-10-CM

## 2023-05-22 DIAGNOSIS — M25612 Stiffness of left shoulder, not elsewhere classified: Secondary | ICD-10-CM | POA: Diagnosis not present

## 2023-05-22 DIAGNOSIS — M6281 Muscle weakness (generalized): Secondary | ICD-10-CM | POA: Diagnosis not present

## 2023-05-22 DIAGNOSIS — G8929 Other chronic pain: Secondary | ICD-10-CM | POA: Diagnosis not present

## 2023-05-22 DIAGNOSIS — M25512 Pain in left shoulder: Secondary | ICD-10-CM | POA: Diagnosis not present

## 2023-05-22 NOTE — Therapy (Signed)
 OUTPATIENT PHYSICAL THERAPY CERVICAL PROGRESS   Patient Name: [name] MRN: [id number] DOB:03-19-1955, 68 y.o., female Today's Date: 05/22/2023  END OF SESSION:  PT End of Session - 05/22/23 1146     Visit Number 3    Date for PT Re-Evaluation 07/05/23    Authorization Type [employer] 16 visits  4/30-6/25    PT Start Time 1146    PT Stop Time 1230    PT Time Calculation (min) 44 min    Activity Tolerance Patient tolerated treatment well    Behavior During Therapy WFL for tasks assessed/performed             Past Medical History:  Diagnosis Date   AF (paroxysmal atrial fibrillation) (HCC)    Arthritis    BCC (basal cell carcinoma) 03/11/2003   left distal lower leg ankle-tx dr Alanda Allegra   Biventricular cardiac pacemaker in situ    a. prior CRT-D in 2013 with downgrade to CRT-Pacemaker only in 11/2016.   Breast cancer (HCC) 12/12/2019   Chronic systolic CHF (congestive heart failure) (HCC)    Complication of anesthesia    aspiration   Essential tremor    GERD (gastroesophageal reflux disease)    otc   Hypertension    Hypothyroidism    LBBB (left bundle branch block)    conduction disorder   Migraines    Mild aortic insufficiency    NICM (nonischemic cardiomyopathy) (HCC)    a. 2012: normal coronaries, EF 25-30%. b. improved to 55% 11/2016.   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   in situ- right lower leg-anterior ([id number])   Scoliosis    Past Surgical History:  Procedure Laterality Date   ARTERY BIOPSY  12/29/2011   Procedure: MINOR BIOPSY TEMPORAL ARTERY;  Surgeon: Quitman Bucy, MD;  Location: [facility];  Service: General;  Laterality: Right;  Right temporal artery biopsy   BACK SURGERY  12/31/2009   "for flat  back syndrome; fused bottom of my back"   BI-VENTRICULAR IMPLANTABLE CARDIOVERTER DEFIBRILLATOR Left 01/20/2011   Procedure: BI-VENTRICULAR IMPLANTABLE CARDIOVERTER DEFIBRILLATOR  (CRT-D);  Surgeon: Tammie Fall, MD;  Location: [facility] CATH LAB;  Service: Cardiovascular;  Laterality: Left;   BIV ICD GENERATOR CHANGEOUT N/A 11/24/2016   Procedure: BIV ICD GENERATOR CHANGEOUT;  Surgeon: Tammie Fall, MD;  Location: [facility] INVASIVE CV LAB;  Service: Cardiovascular;  Laterality: N/A;   BREAST LUMPECTOMY WITH RADIOACTIVE SEED LOCALIZATION Left 01/22/2020   Procedure: LEFT BREAST LUMPECTOMY WITH RADIOACTIVE SEED LOCALIZATION;  Surgeon: Dareen Ebbing, MD;  Location: [facility];  Service: General;  Laterality: Left;  LMA   CERVICAL ABLATION  03/29/2023   CHOLECYSTECTOMY  ~1996   FOOT NEUROMA SURGERY  ~ 2003   right   RE-EXCISION OF BREAST LUMPECTOMY Left 03/03/2020   Procedure: RE-EXCISION MARGINS OF LEFT BREAST LUMPECTOMY;  Surgeon: Dareen Ebbing, MD;  Location: [facility];  Service: General;  Laterality: Left;   ROTATOR CUFF REPAIR  ~ 2009   left   SHOULDER ARTHROSCOPY W/ ROTATOR CUFF REPAIR Right 09/21/2011   SKIN CANCER EXCISION     nose, forehead, left leg   TEE WITHOUT CARDIOVERSION N/A 03/01/2017   Procedure: TRANSESOPHAGEAL ECHOCARDIOGRAM (TEE);  Surgeon: Maudine Sos, MD;  Location: [facility] ENDOSCOPY;  Service: Cardiovascular;  Laterality: N/A;   THYROID  LOBECTOMY  ~ 2006   right   TUBAL LIGATION  ~ 1983   VAGINAL HYSTERECTOMY  ~ 2009   VESICOVAGINAL FISTULA CLOSURE W/ TAH     Patient Active Problem List   Diagnosis Date Noted   Encounter for procedure for purposes other than remedying health state, unspecified 05/09/2023   Cellulitis of right lower extremity 02/03/2023   Delayed healing of traumatic wound 02/03/2023   Biventricular ICD (implantable cardioverter-defibrillator) in place    DOE (dyspnea on exertion) 04/07/2021   Age-related osteoporosis  without current pathological fracture 11/29/2020   Atrophy of vagina 11/29/2020   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   Breast CA (HCC) 02/18/2020   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   Rosacea 11/05/2019   Scar 11/05/2019   Biventricular cardiac pacemaker in situ 10/08/2018   Long term current use of anticoagulant therapy 10/23/2017   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   Trigeminal neuralgia 12/14/2012   Insomnia, persistent 11/03/2011   AICD (automatic cardioverter/defibrillator) present    Aortic regurgitation    Nonischemic cardiomyopathy (HCC) 01/21/2011   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/17/2010   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   Esophagitis, reflux 08/24/2010   Idiopathic peripheral neuropathy 08/24/2010   Chronic systolic CHF (congestive heart failure) (HCC) 02/24/2010   Paroxysmal atrial fibrillation (HCC) 02/23/2010   Hypothyroidism    History of migraine headaches    Left bundle branch block    Idiopathic scoliosis and kyphoscoliosis     Class: Chronic   Lumbar canal stenosis 12/25/2009   Menopause 04/15/2009   Anxiety state 07/24/2008   Avitaminosis D 03/21/2008   Atypical migraine 01/30/2008   Benign essential HTN 01/30/2008   Chronic migraine w/o aura w/o status migrainosus, not intractable 01/30/2008   Paroxysmal digital cyanosis 02/15/2007   Raynaud's phenomenon 02/15/2007    PCP: Persons, Norma Beckers, [place]  REFERRING PROVIDER: Persons, Norma Beckers, [place]  REFERRING DIAG: M81.0 (ICD-10-CM) - Age-related osteoporosis without current pathological fracture  THERAPY DIAG:  Abnormal posture  Muscle weakness (generalized)  Cramp and spasm  Difficulty in walking, not elsewhere classified  Rationale for Evaluation and Treatment: Rehabilitation  ONSET DATE: 04/24/2023  SUBJECTIVE:                                                                                                                                                                                                          SUBJECTIVE STATEMENT: Will see Dr. Rozelle Corning 5/8 about neck surgery.  I'd like  to work on my bones today.  I took some medication before I came.    Eval:  This is patient's second episode recently.  She reports she continues to have neck pain and left shoulder pain and has been diagnosed with osteoporosis.  She has not done her exercises from last episode of PT.  She was afraid that they were hurting her.  She expresses concern over having any invasive treatment due to a bad experience in her past.  She has pacemaker as well.  She hopes to gain strength and reduce her overall pain.   Hand dominance: Right  PERTINENT HISTORY:  Long hx of neck and left shoulder pain, has been advised to have left shoulder replacement  PAIN:  Are you having pain? Yes: NPRS scale: 7/10  Pain location: neck and shoulder  Pain description: aching, tight Aggravating factors: driving, reaching overhead Relieving factors: Not using the left arm  PRECAUTIONS: None  RED FLAGS: None     WEIGHT BEARING RESTRICTIONS: No  FALLS:  Has patient fallen in last 6 months? Yes. Number of falls 1  LIVING ENVIRONMENT: Lives with: lives with their spouse Lives in: House/apartment   OCCUPATION: retired  PLOF: Independent, Independent with basic ADLs, Independent with household mobility without device, Independent with community mobility without device, Independent with homemaking with ambulation, Independent with gait, and Independent with transfers  PATIENT GOALS: to eliminate pain  NEXT MD VISIT: prn  OBJECTIVE:  Note: Objective measures were completed at Evaluation unless otherwise noted.  DIAGNOSTIC FINDINGS:  IMPRESSION: 1. Fused thoracic scoliosis with compensatory cervical curvature and multilevel degeneration. 2. Moderate foraminal impingement bilaterally at C5-6 and C6-7, similar to 08/30/2019 cervical MRI. The  spinal canal appears widely patent.  COGNITION: Overall cognitive status: Within functional limits for tasks assessed  SENSATION: WFL  POSTURE: rounded shoulders, forward head, and thoracic fusion and scoliosis  PALPATION: Severe fascial restriction and trigger points bilateral upper traps   CERVICAL ROM:   Active ROM A/PROM (deg) eval  Flexion 30  Extension 20  Right lateral flexion 12  Left lateral flexion 8  Right rotation 30  Left rotation 20   (Blank rows = not tested)  UPPER EXTREMITY ROM:  Left UE significantly restricted due to end stage OA.  Provider has suggested shoulder replacement.   UPPER EXTREMITY MMT:  MMT Right eval Left eval  Shoulder flexion    Shoulder extension    Shoulder abduction    Shoulder adduction    Shoulder extension    Shoulder internal rotation    Shoulder external rotation    Middle trapezius    Lower trapezius    Elbow flexion    Elbow extension    Wrist flexion    Wrist extension    Wrist ulnar deviation    Wrist radial deviation    Wrist pronation    Wrist supination    Grip strength     (Blank rows = not tested)   FUNCTIONAL TESTS:  5 times sit to stand: 10.51 sec Timed up and go (TUG): 9.49 sec   TREATMENT DATE: 05/22/23 Nustep x 5 min level 3 Sit to stand x 10 added balance pad due to hip weakness, patient was struggling to come up safely Seated LAQ 2 x 10 with 4 lbs both Seated march x 20 with 4 lbs both Seated hip ER 2 x 10 with 0 lbs both Seated punches with 2 lb dumbbells x 20 alternating Seated steam engines with 2 lb dumbbells x 20  Patient began to discuss a very stressful situation she is in with her sister who is having dementia issues and lives with a roommate who has dementia and she (the patient) is the only available caregiver and she is not healthy enough to be available for them and neither have children or any other available caregivers.  She is considering moving due to the stress.  She  describes multiple very serious situations that she has tried to help handle.  The two in need of help keep running off any help she hires to help them because the sister leaves the home and they are unable to get in.  The sister has been told she is not supposed to be driving.   PT educated on how stress can contribute to muscle tension and pain creating elevated symptoms of an existing diagnosis. Discussed some options and encouraged patient to discuss with primary MD.    TREATMENT DATE: 05/10/23 Nu-Step L3 8 min Osteoporosis education-handout given see patient instructions Impact ex's for UE (T score for left radius = osteoporosis): clapping, thigh patting  Impact ex for LE (T score for femur= osteopenia): marching, heel drops Sit to stand from mat + cushion 5x (pt able to rise without UE assist at this height) (added to HEP) Seated 2# wrist extension 10x (added to HEP) Seated 2# wrist pronation supination 10x (added to HEP) Farmer's carry both 2# weights 40 feet x2 Pt has 2# weights at home   TREATMENT DATE: 05/10/23 Initial eval completed Education on end stage OA and how this contributes to her neck pain,  educated on anatomy of the shoulder and c spine, educated on limits of PT when there are end stage degenerative changes.   MD has suggested shoulder replacement and that she will likely continue to struggle with the pain since we focused on this last episode and she did not see significant improvement.                                                                                                                                  PATIENT EDUCATION:  Education details: See above Person educated: Patient Education method: Explanation Education comprehension: verbalized understanding  HOME EXERCISE PROGRAM: Access Code: [email or web address] URL: [email or web address] Date: 05/12/2023 Prepared by: Darien Eden  Exercises - Sit to Stand  - 2 x daily - 7 x weekly - 1 sets - 5  reps - Wrist Extension with Dumbbell  - 1 x daily - 7 x weekly - 1 sets - 10 reps - Seated Pronation Supination with Dumbbell  - 1 x daily - 7 x weekly - 1 sets - 10 reps - Farmer's Carry with Kettlebells  - 1 x daily - 7 x weekly - 1 sets - 10 reps Begin next visjt, spent most of session educating and discussing options  ASSESSMENT:  CLINICAL IMPRESSION: Naveah met with her ortho provider who referred her to a neurosurgeon.  Xrays show  fairly insignificant shoulder issues but her c spine has several levels of severe OA and spacing issues.  We worked primarily on resistive and functional exercises today for bone density purposes.  She did well with this.  We did have quite a lengthy conversation about some very stressful situations going on with her sister, to the point of her considering moving to [place] .  This level of stress is keeping her awake at night and very likely contributing to her neck and shoulder issues.  She will be meeting with neurosurgeon in  few weeks and will be making some decisions on how to proceed.      OBJECTIVE IMPAIRMENTS: Abnormal gait, cardiopulmonary status limiting activity, decreased balance, decreased endurance, difficulty walking, decreased ROM, decreased strength, hypomobility, increased fascial restrictions, increased muscle spasms, impaired flexibility, postural dysfunction, and pain.   ACTIVITY LIMITATIONS: carrying, lifting, bending, sitting, standing, squatting, sleeping, stairs, transfers, bed mobility, continence, bathing, toileting, dressing, reach over head, hygiene/grooming, and caring for others  PARTICIPATION LIMITATIONS: meal prep, cleaning, laundry, driving, shopping, and community activity  PERSONAL FACTORS: Behavior pattern, Fitness, Past/current experiences, Time since onset of injury/illness/exacerbation, and 3+ comorbidities: scoliosis, htn, pacemaker, and CHF are also affecting patient's functional outcome.   REHAB POTENTIAL: Fair due  to recent episode and still opting to decline surgery  CLINICAL DECISION MAKING: Unstable/unpredictable  EVALUATION COMPLEXITY: High   GOALS: Goals reviewed with patient? Yes  SHORT TERM GOALS: Target date: 06/07/2023  Pain report to be no greater than 4/10  Baseline:  Goal status: In progress  2.  Patient will be independent with initial HEP  Baseline:  Goal status: In progress   LONG TERM GOALS: Target date: 07/05/2023  Patient to report pain no greater than 2/10  Baseline:  Goal status: INITIAL  2.  Patient to be independent with advanced HEP  Baseline:  Goal status: INITIAL  3.  Patient to demonstrate consistency with some level of exercise Baseline:  Goal status: INITIAL  4.  C spine ROM to improve by 2-3 degrees each direction Baseline:  Goal status: INITIAL  5.  Patient to be able to stand or walk for at least 15 min without leg pain  Baseline:  Goal status: INITIAL  6.  Patient to have consult with orthopedic provider Baseline:  Goal status: INITIAL   PLAN:  PT FREQUENCY: 1-2x/week  PT DURATION: 8 weeks  PLANNED INTERVENTIONS: 97110-Therapeutic exercises, 97530- Therapeutic activity, [id number]- Neuromuscular re-education, 97535- Self Care, [id number]- Manual therapy, [phone]- Gait training, [phone]- Canalith repositioning, [id number]- Aquatic Therapy, [phone]- Electrical stimulation (unattended), [phone]- Electrical stimulation (manual), [id number]- Vasopneumatic device, [id number]- Ultrasound, [id number]- Ionotophoresis 4mg /ml Dexamethasone , Patient/Family education, Balance training, Stair training, Taping, Dry Needling, Joint mobilization, Spinal mobilization, Vestibular training, Visual/preceptual remediation/compensation, Cryotherapy, and Moist heat  PLAN FOR NEXT SESSION: Nustep, begin resistive and weight bearing training   Solymar Grace B. Anastaisa Wooding, PT 05/22/23 1:06 PM [facility] [address], [address] [place], [place] [address] Phone # [phone] Fax  [phone]

## 2023-05-23 ENCOUNTER — Ambulatory Visit (INDEPENDENT_AMBULATORY_CARE_PROVIDER_SITE_OTHER): Payer: Medicare PPO

## 2023-05-23 DIAGNOSIS — I428 Other cardiomyopathies: Secondary | ICD-10-CM

## 2023-05-24 LAB — CUP PACEART REMOTE DEVICE CHECK
Battery Remaining Longevity: 40 mo
Battery Voltage: 2.92 V
Brady Statistic AP VP Percent: 17.06 %
Brady Statistic AP VS Percent: 0.4 %
Brady Statistic AS VP Percent: 80.99 %
Brady Statistic AS VS Percent: 1.55 %
Brady Statistic RA Percent Paced: 17.46 %
Brady Statistic RV Percent Paced: 1.99 %
Date Time Interrogation Session: 20250512222205
Implantable Lead Connection Status: 753985
Implantable Lead Connection Status: 753985
Implantable Lead Connection Status: 753985
Implantable Lead Implant Date: 20130110
Implantable Lead Implant Date: 20130110
Implantable Lead Implant Date: 20130110
Implantable Lead Location: 753858
Implantable Lead Location: 753859
Implantable Lead Location: 753860
Implantable Lead Model: 4196
Implantable Lead Model: 5076
Implantable Lead Model: 6935
Implantable Pulse Generator Implant Date: 20181115
Lead Channel Impedance Value: 1216 Ohm
Lead Channel Impedance Value: 323 Ohm
Lead Channel Impedance Value: 380 Ohm
Lead Channel Impedance Value: 570 Ohm
Lead Channel Impedance Value: 665 Ohm
Lead Channel Impedance Value: 779 Ohm
Lead Channel Impedance Value: 798 Ohm
Lead Channel Impedance Value: 836 Ohm
Lead Channel Impedance Value: 874 Ohm
Lead Channel Pacing Threshold Amplitude: 0.625 V
Lead Channel Pacing Threshold Amplitude: 1.625 V
Lead Channel Pacing Threshold Amplitude: 2.5 V
Lead Channel Pacing Threshold Pulse Width: 0.4 ms
Lead Channel Pacing Threshold Pulse Width: 0.4 ms
Lead Channel Pacing Threshold Pulse Width: 0.8 ms
Lead Channel Sensing Intrinsic Amplitude: 0.625 mV
Lead Channel Sensing Intrinsic Amplitude: 0.625 mV
Lead Channel Sensing Intrinsic Amplitude: 17 mV
Lead Channel Sensing Intrinsic Amplitude: 17 mV
Lead Channel Setting Pacing Amplitude: 2 V
Lead Channel Setting Pacing Amplitude: 2.5 V
Lead Channel Setting Pacing Amplitude: 3.5 V
Lead Channel Setting Pacing Pulse Width: 0.8 ms
Lead Channel Setting Pacing Pulse Width: 1 ms
Lead Channel Setting Sensing Sensitivity: 0.9 mV
Zone Setting Status: 755011
Zone Setting Status: 755011

## 2023-05-25 DIAGNOSIS — G2581 Restless legs syndrome: Secondary | ICD-10-CM | POA: Diagnosis not present

## 2023-05-25 DIAGNOSIS — R251 Tremor, unspecified: Secondary | ICD-10-CM | POA: Diagnosis not present

## 2023-05-25 DIAGNOSIS — M7918 Myalgia, other site: Secondary | ICD-10-CM | POA: Diagnosis not present

## 2023-05-25 DIAGNOSIS — G5603 Carpal tunnel syndrome, bilateral upper limbs: Secondary | ICD-10-CM | POA: Diagnosis not present

## 2023-05-25 DIAGNOSIS — G518 Other disorders of facial nerve: Secondary | ICD-10-CM | POA: Diagnosis not present

## 2023-05-25 DIAGNOSIS — M7912 Myalgia of auxiliary muscles, head and neck: Secondary | ICD-10-CM | POA: Diagnosis not present

## 2023-05-25 DIAGNOSIS — F419 Anxiety disorder, unspecified: Secondary | ICD-10-CM | POA: Diagnosis not present

## 2023-05-25 DIAGNOSIS — I7300 Raynaud's syndrome without gangrene: Secondary | ICD-10-CM | POA: Diagnosis not present

## 2023-05-25 DIAGNOSIS — G43711 Chronic migraine without aura, intractable, with status migrainosus: Secondary | ICD-10-CM | POA: Diagnosis not present

## 2023-05-29 ENCOUNTER — Ambulatory Visit: Payer: Self-pay | Admitting: Internal Medicine

## 2023-05-31 ENCOUNTER — Other Ambulatory Visit: Payer: Self-pay

## 2023-05-31 ENCOUNTER — Ambulatory Visit

## 2023-05-31 DIAGNOSIS — M810 Age-related osteoporosis without current pathological fracture: Secondary | ICD-10-CM

## 2023-06-01 ENCOUNTER — Other Ambulatory Visit: Payer: Self-pay

## 2023-06-01 DIAGNOSIS — D0512 Intraductal carcinoma in situ of left breast: Secondary | ICD-10-CM

## 2023-06-01 MED ORDER — ANASTROZOLE 1 MG PO TABS: 1.0000 mg | ORAL_TABLET | Freq: Every day | ORAL | 0 refills | Status: DC

## 2023-06-02 ENCOUNTER — Ambulatory Visit

## 2023-06-06 ENCOUNTER — Ambulatory Visit

## 2023-06-06 DIAGNOSIS — R293 Abnormal posture: Secondary | ICD-10-CM

## 2023-06-06 DIAGNOSIS — M25612 Stiffness of left shoulder, not elsewhere classified: Secondary | ICD-10-CM | POA: Diagnosis not present

## 2023-06-06 DIAGNOSIS — R252 Cramp and spasm: Secondary | ICD-10-CM | POA: Diagnosis not present

## 2023-06-06 DIAGNOSIS — M6281 Muscle weakness (generalized): Secondary | ICD-10-CM

## 2023-06-06 DIAGNOSIS — M25512 Pain in left shoulder: Secondary | ICD-10-CM | POA: Diagnosis not present

## 2023-06-06 DIAGNOSIS — G8929 Other chronic pain: Secondary | ICD-10-CM | POA: Diagnosis not present

## 2023-06-06 DIAGNOSIS — R262 Difficulty in walking, not elsewhere classified: Secondary | ICD-10-CM

## 2023-06-06 NOTE — Therapy (Signed)
 OUTPATIENT PHYSICAL THERAPY CERVICAL PROGRESS   Patient Name: [name] MRN: [id number] DOB:Mar 05, 1955, 68 y.o., female Today's Date: 06/06/2023  END OF SESSION:  PT End of Session - 06/06/23 1152     Visit Number 4    Date for PT Re-Evaluation 07/05/23    Authorization Type [employer] 16 visits  4/30-6/25    PT Start Time 1145    PT Stop Time 1230    PT Time Calculation (min) 45 min    Activity Tolerance Patient tolerated treatment well    Behavior During Therapy WFL for tasks assessed/performed             Past Medical History:  Diagnosis Date   AF (paroxysmal atrial fibrillation) (HCC)    Arthritis    BCC (basal cell carcinoma) 03/11/2003   left distal lower leg ankle-tx dr Alanda Allegra   Biventricular cardiac pacemaker in situ    a. prior CRT-D in 2013 with downgrade to CRT-Pacemaker only in 11/2016.   Breast cancer (HCC) 12/12/2019   Chronic systolic CHF (congestive heart failure) (HCC)    Complication of anesthesia    aspiration   Essential tremor    GERD (gastroesophageal reflux disease)    otc   Hypertension    Hypothyroidism    LBBB (left bundle branch block)    conduction disorder   Migraines    Mild aortic insufficiency    NICM (nonischemic cardiomyopathy) (HCC)    a. 2012: normal coronaries, EF 25-30%. b. improved to 55% 11/2016.   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   in situ- right lower leg-anterior ([id number])   Scoliosis    Past Surgical History:  Procedure Laterality Date   ARTERY BIOPSY  12/29/2011   Procedure: MINOR BIOPSY TEMPORAL ARTERY;  Surgeon: Quitman Bucy, MD;  Location: [facility];  Service: General;  Laterality: Right;  Right temporal artery biopsy   BACK SURGERY  12/31/2009   "for flat  back syndrome; fused bottom of my back"   BI-VENTRICULAR IMPLANTABLE CARDIOVERTER DEFIBRILLATOR Left 01/20/2011   Procedure: BI-VENTRICULAR IMPLANTABLE CARDIOVERTER DEFIBRILLATOR  (CRT-D);  Surgeon: Tammie Fall, MD;  Location: [facility];  Service: Cardiovascular;  Laterality: Left;   BIV ICD GENERATOR CHANGEOUT N/A 11/24/2016   Procedure: BIV ICD GENERATOR CHANGEOUT;  Surgeon: Tammie Fall, MD;  Location: [facility] INVASIVE CV LAB;  Service: Cardiovascular;  Laterality: N/A;   BREAST LUMPECTOMY WITH RADIOACTIVE SEED LOCALIZATION Left 01/22/2020   Procedure: LEFT BREAST LUMPECTOMY WITH RADIOACTIVE SEED LOCALIZATION;  Surgeon: Dareen Ebbing, MD;  Location: [facility];  Service: General;  Laterality: Left;  LMA   CERVICAL ABLATION  03/29/2023   CHOLECYSTECTOMY  ~1996   FOOT NEUROMA SURGERY  ~ 2003   right   RE-EXCISION OF BREAST LUMPECTOMY Left 03/03/2020   Procedure: RE-EXCISION MARGINS OF LEFT BREAST LUMPECTOMY;  Surgeon: Dareen Ebbing, MD;  Location: [facility];  Service: General;  Laterality: Left;   ROTATOR CUFF REPAIR  ~ 2009   left   SHOULDER ARTHROSCOPY W/ ROTATOR CUFF REPAIR Right 09/21/2011   SKIN CANCER EXCISION     nose, forehead, left leg   TEE WITHOUT CARDIOVERSION N/A 03/01/2017   Procedure: TRANSESOPHAGEAL ECHOCARDIOGRAM (TEE);  Surgeon: Maudine Sos, MD;  Location: [facility]OSCOPY;  Service: Cardiovascular;  Laterality: N/A;   THYROID  LOBECTOMY  ~ 2006   right   TUBAL LIGATION  ~ 1983   VAGINAL HYSTERECTOMY  ~ 2009   VESICOVAGINAL FISTULA CLOSURE W/ TAH     Patient Active Problem List   Diagnosis Date Noted   Encounter for procedure for purposes other than remedying health state, unspecified 05/09/2023   Cellulitis of right lower extremity 02/03/2023   Delayed healing of traumatic wound 02/03/2023   Biventricular ICD (implantable cardioverter-defibrillator) in place    DOE (dyspnea on exertion) 04/07/2021   Age-related osteoporosis  without current pathological fracture 11/29/2020   Atrophy of vagina 11/29/2020   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   Breast CA (HCC) 02/18/2020   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   Rosacea 11/05/2019   Scar 11/05/2019   Biventricular cardiac pacemaker in situ 10/08/2018   Long term current use of anticoagulant therapy 10/23/2017   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   Trigeminal neuralgia 12/14/2012   Insomnia, persistent 11/03/2011   AICD (automatic cardioverter/defibrillator) present    Aortic regurgitation    Nonischemic cardiomyopathy (HCC) 01/21/2011   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/17/2010   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   Esophagitis, reflux 08/24/2010   Idiopathic peripheral neuropathy 08/24/2010   Chronic systolic CHF (congestive heart failure) (HCC) 02/24/2010   Paroxysmal atrial fibrillation (HCC) 02/23/2010   Hypothyroidism    History of migraine headaches    Left bundle branch block    Idiopathic scoliosis and kyphoscoliosis     Class: Chronic   Lumbar canal stenosis 12/25/2009   Menopause 04/15/2009   Anxiety state 07/24/2008   Avitaminosis D 03/21/2008   Atypical migraine 01/30/2008   Benign essential HTN 01/30/2008   Chronic migraine w/o aura w/o status migrainosus, not intractable 01/30/2008   Paroxysmal digital cyanosis 02/15/2007   Raynaud's phenomenon 02/15/2007    PCP: Persons, Norma Beckers, [place]  REFERRING PROVIDER: Persons, Norma Beckers, [place]  REFERRING DIAG: M81.0 (ICD-10-CM) - Age-related osteoporosis without current pathological fracture  THERAPY DIAG:  Abnormal posture  Muscle weakness (generalized)  Cramp and spasm  Difficulty in walking, not elsewhere classified  Stiffness of left shoulder, not elsewhere classified  Chronic left shoulder pain  Rationale for Evaluation and Treatment: Rehabilitation  ONSET DATE: 04/24/2023  SUBJECTIVE:  SUBJECTIVE STATEMENT: Patient reports she saw Dr. Rozelle Corning.  He did xrays of her neck and left shoulder.  He feels her neck is way worse than her shoulder and that she needs to see neuro for consult.  She sees Dr. Rochelle Chu tomorrow.  She is feeling all neck and left shoulder pain at this point.  Ready to see if there is anything they can do.      Eval:  This is patient's second episode recently.  She reports she continues to have neck pain and left shoulder pain and has been diagnosed with osteoporosis.  She has not done her exercises from last episode of PT.  She was afraid that they were hurting her.  She expresses concern over having any invasive treatment due to a bad experience in her past.  She has pacemaker as well.  She hopes to gain strength and reduce her overall pain.   Hand dominance: Right  PERTINENT HISTORY:  Long hx of neck and left shoulder pain, has been advised to have left shoulder replacement  PAIN:  Are you having pain? Yes: NPRS scale: 7/10  Pain location: neck and shoulder  Pain description: aching, tight Aggravating factors: driving, reaching overhead Relieving factors: Not using the left arm  PRECAUTIONS: None  RED FLAGS: None     WEIGHT BEARING RESTRICTIONS: No  FALLS:  Has patient fallen in last 6 months? Yes. Number of falls 1  LIVING ENVIRONMENT: Lives with: lives with their spouse Lives in: House/apartment   OCCUPATION: retired  PLOF: Independent, Independent with basic ADLs, Independent with household mobility without device, Independent with community mobility without device, Independent with homemaking with ambulation, Independent with gait, and Independent with transfers  PATIENT GOALS: to eliminate pain  NEXT MD VISIT: prn  OBJECTIVE:  Note: Objective measures were  completed at Evaluation unless otherwise noted.  DIAGNOSTIC FINDINGS:  IMPRESSION: 1. Fused thoracic scoliosis with compensatory cervical curvature and multilevel degeneration. 2. Moderate foraminal impingement bilaterally at C5-6 and C6-7, similar to 08/30/2019 cervical MRI. The spinal canal appears widely patent.  COGNITION: Overall cognitive status: Within functional limits for tasks assessed  SENSATION: WFL  POSTURE: rounded shoulders, forward head, and thoracic fusion and scoliosis  PALPATION: Severe fascial restriction and trigger points bilateral upper traps   CERVICAL ROM:   Active ROM A/PROM (deg) eval  Flexion 30  Extension 20  Right lateral flexion 12  Left lateral flexion 8  Right rotation 30  Left rotation 20   (Blank rows = not tested)  UPPER EXTREMITY ROM:  Left UE significantly restricted due to end stage OA.  Provider has suggested shoulder replacement.   UPPER EXTREMITY MMT:  MMT Right eval Left eval  Shoulder flexion    Shoulder extension    Shoulder abduction    Shoulder adduction    Shoulder extension    Shoulder internal rotation    Shoulder external rotation    Middle trapezius    Lower trapezius    Elbow flexion    Elbow extension    Wrist flexion    Wrist extension    Wrist ulnar deviation    Wrist radial deviation    Wrist pronation    Wrist supination    Grip strength     (Blank rows = not tested)   FUNCTIONAL TESTS:  5 times sit to stand: 10.51 sec Timed up and go (TUG): 9.49 sec   TREATMENT DATE: 05/22/23 Nustep x 5 min level 3 Sit to stand x 10 added balance pad  due to hip weakness, patient was struggling to come up safely Seated LAQ 2 x 10 with 4 lbs both Seated march x 20 with 4 lbs both Seated hip ER 2 x 10 with 0 lbs both Seated punches with 2 lb dumbbells x 20 alternating Seated steam engines with 2 lb dumbbells x 20  Patient began to discuss a very stressful situation she is in with her sister who is having  dementia issues and lives with a roommate who has dementia and she (the patient) is the only available caregiver and she is not healthy enough to be available for them and neither have children or any other available caregivers.  She is considering moving due to the stress.  She describes multiple very serious situations that she has tried to help handle.  The two in need of help keep running off any help she hires to help them because the sister leaves the home and they are unable to get in.  The sister has been told she is not supposed to be driving.   PT educated on how stress can contribute to muscle tension and pain creating elevated symptoms of an existing diagnosis. Discussed some options and encouraged patient to discuss with primary MD.    TREATMENT DATE: 05/10/23 Nu-Step L3 8 min Osteoporosis education-handout given see patient instructions Impact ex's for UE (T score for left radius = osteoporosis): clapping, thigh patting  Impact ex for LE (T score for femur= osteopenia): marching, heel drops Sit to stand from mat + cushion 5x (pt able to rise without UE assist at this height) (added to HEP) Seated 2# wrist extension 10x (added to HEP) Seated 2# wrist pronation supination 10x (added to HEP) Farmer's carry both 2# weights 40 feet x2 Pt has 2# weights at home   TREATMENT DATE: 05/10/23 Initial eval completed Education on end stage OA and how this contributes to her neck pain,  educated on anatomy of the shoulder and c spine, educated on limits of PT when there are end stage degenerative changes.   MD has suggested shoulder replacement and that she will likely continue to struggle with the pain since we focused on this last episode and she did not see significant improvement.                                                                                                                                  PATIENT EDUCATION:  Education details: See above Person educated: Patient Education  method: Explanation Education comprehension: verbalized understanding  HOME EXERCISE PROGRAM: Access Code: [email or web address] URL: [email or web address] Date: 05/12/2023 Prepared by: Darien Eden  Exercises - Sit to Stand  - 2 x daily - 7 x weekly - 1 sets - 5 reps - Wrist Extension with Dumbbell  - 1 x daily - 7 x weekly - 1 sets - 10 reps - Seated Pronation Supination with Dumbbell  - 1 x daily - 7 x weekly -  1 sets - 10 reps - Farmer's Carry with Kettlebells  - 1 x daily - 7 x weekly - 1 sets - 10 reps Begin next visjt, spent most of session educating and discussing options  ASSESSMENT:  CLINICAL IMPRESSION: Leslea met with her ortho provider who referred her to a neurosurgeon.  Xrays show fairly insignificant shoulder issues but her c spine has several levels of severe OA and spacing issues.  We worked primarily on resistive and functional exercises today for bone density purposes.  She did well with this.  We did have quite a lengthy conversation about some very stressful situations going on with her sister, to the point of her considering moving to [place] .  This level of stress is keeping her awake at night and very likely contributing to her neck and shoulder issues.  She will be meeting with neurosurgeon in  few weeks and will be making some decisions on how to proceed.      OBJECTIVE IMPAIRMENTS: Abnormal gait, cardiopulmonary status limiting activity, decreased balance, decreased endurance, difficulty walking, decreased ROM, decreased strength, hypomobility, increased fascial restrictions, increased muscle spasms, impaired flexibility, postural dysfunction, and pain.   ACTIVITY LIMITATIONS: carrying, lifting, bending, sitting, standing, squatting, sleeping, stairs, transfers, bed mobility, continence, bathing, toileting, dressing, reach over head, hygiene/grooming, and caring for others  PARTICIPATION LIMITATIONS: meal prep, cleaning, laundry, driving, shopping, and  community activity  PERSONAL FACTORS: Behavior pattern, Fitness, Past/current experiences, Time since onset of injury/illness/exacerbation, and 3+ comorbidities: scoliosis, htn, pacemaker, and CHF are also affecting patient's functional outcome.   REHAB POTENTIAL: Fair due to recent episode and still opting to decline surgery  CLINICAL DECISION MAKING: Unstable/unpredictable  EVALUATION COMPLEXITY: High   GOALS: Goals reviewed with patient? Yes  SHORT TERM GOALS: Target date: 06/07/2023  Pain report to be no greater than 4/10  Baseline:  Goal status: In progress  2.  Patient will be independent with initial HEP  Baseline:  Goal status: In progress   LONG TERM GOALS: Target date: 07/05/2023  Patient to report pain no greater than 2/10  Baseline:  Goal status: INITIAL  2.  Patient to be independent with advanced HEP  Baseline:  Goal status: INITIAL  3.  Patient to demonstrate consistency with some level of exercise Baseline:  Goal status: INITIAL  4.  C spine ROM to improve by 2-3 degrees each direction Baseline:  Goal status: INITIAL  5.  Patient to be able to stand or walk for at least 15 min without leg pain  Baseline:  Goal status: INITIAL  6.  Patient to have consult with orthopedic provider Baseline:  Goal status: INITIAL   PLAN:  PT FREQUENCY: 1-2x/week  PT DURATION: 8 weeks  PLANNED INTERVENTIONS: 97110-Therapeutic exercises, 97530- Therapeutic activity, [id number]- Neuromuscular re-education, 97535- Self Care, [id number]- Manual therapy, [id number]- Gait training, [phone]- Canalith repositioning, [id number]- Aquatic Therapy, [id number]- Electrical stimulation (unattended), [phone]- Electrical stimulation (manual), [id number]- Vasopneumatic device, [id number]- Ultrasound, [id number]- Ionotophoresis 4mg /ml Dexamethasone , Patient/Family education, Balance training, Stair training, Taping, Dry Needling, Joint mobilization, Spinal mobilization, Vestibular training, Visual/preceptual  remediation/compensation, Cryotherapy, and Moist heat  PLAN FOR NEXT SESSION: Nustep, started some left shoulder ROM last visit, incorporate this as this is likely why she is still struggling with neck pain. She saw orthopedist last week who feels her neck is way worse than her shoulder and referred her to Dr. Rochelle Chu (neuro) for consult.  She will see Dr. Rochelle Chu tomorrow.     [facility] B. Ousmane Seeman, PT 06/06/23 12:43 PM [facility] [address] [place], [place] [address] Phone # [phone] Fax [phone]

## 2023-06-07 DIAGNOSIS — M40202 Unspecified kyphosis, cervical region: Secondary | ICD-10-CM | POA: Diagnosis not present

## 2023-06-07 DIAGNOSIS — M542 Cervicalgia: Secondary | ICD-10-CM | POA: Diagnosis not present

## 2023-06-08 ENCOUNTER — Telehealth: Payer: Self-pay | Admitting: Orthopedic Surgery

## 2023-06-08 ENCOUNTER — Telehealth: Payer: Self-pay

## 2023-06-08 ENCOUNTER — Ambulatory Visit

## 2023-06-08 ENCOUNTER — Telehealth: Payer: Self-pay | Admitting: *Deleted

## 2023-06-08 DIAGNOSIS — R252 Cramp and spasm: Secondary | ICD-10-CM | POA: Diagnosis not present

## 2023-06-08 DIAGNOSIS — R262 Difficulty in walking, not elsewhere classified: Secondary | ICD-10-CM | POA: Diagnosis not present

## 2023-06-08 DIAGNOSIS — G8929 Other chronic pain: Secondary | ICD-10-CM

## 2023-06-08 DIAGNOSIS — M25612 Stiffness of left shoulder, not elsewhere classified: Secondary | ICD-10-CM

## 2023-06-08 DIAGNOSIS — M6281 Muscle weakness (generalized): Secondary | ICD-10-CM

## 2023-06-08 DIAGNOSIS — R293 Abnormal posture: Secondary | ICD-10-CM | POA: Diagnosis not present

## 2023-06-08 DIAGNOSIS — M25512 Pain in left shoulder: Secondary | ICD-10-CM | POA: Diagnosis not present

## 2023-06-08 NOTE — Therapy (Signed)
 OUTPATIENT PHYSICAL THERAPY CERVICAL PROGRESS   Patient Name: [name] MRN: [id number] DOB:06-Dec-1955, 68 y.o., female Today's Date: 06/08/2023  END OF SESSION:  PT End of Session - 06/08/23 1141     Visit Number 5    Number of Visits 16    Date for PT Re-Evaluation 07/05/23    Authorization Type [employer] 16 visits  4/30-6/25    Authorization - Visit Number 5    Authorization - Number of Visits 16    Progress Note Due on Visit 10    PT Start Time 1142    PT Stop Time 1227    PT Time Calculation (min) 45 min    Activity Tolerance Patient limited by pain    Behavior During Therapy Anxious;Flat affect             Past Medical History:  Diagnosis Date   AF (paroxysmal atrial fibrillation) (HCC)    Arthritis    BCC (basal cell carcinoma) 03/11/2003   left distal lower leg ankle-tx dr Alanda Allegra   Biventricular cardiac pacemaker in situ    a. prior CRT-D in 2013 with downgrade to CRT-Pacemaker only in 11/2016.   Breast cancer (HCC) 12/12/2019   Chronic systolic CHF (congestive heart failure) (HCC)    Complication of anesthesia    aspiration   Essential tremor    GERD (gastroesophageal reflux disease)    otc   Hypertension    Hypothyroidism    LBBB (left bundle branch block)    conduction disorder   Migraines    Mild aortic insufficiency    NICM (nonischemic cardiomyopathy) (HCC)    a. 2012: normal coronaries, EF 25-30%. b. improved to 55% 11/2016.   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   in situ- right lower leg-anterior ([id number])   Scoliosis    Past Surgical History:  Procedure Laterality Date   ARTERY BIOPSY  12/29/2011   Procedure: MINOR BIOPSY TEMPORAL ARTERY;  Surgeon: Quitman Bucy, MD;  Location: [facility];   Service: General;  Laterality: Right;  Right temporal artery biopsy   BACK SURGERY  12/31/2009   "for flat back syndrome; fused bottom of my back"   BI-VENTRICULAR IMPLANTABLE CARDIOVERTER DEFIBRILLATOR Left 01/20/2011   Procedure: BI-VENTRICULAR IMPLANTABLE CARDIOVERTER DEFIBRILLATOR  (CRT-D);  Surgeon: Tammie Fall, MD;  Location: [facility] CATH LAB;  Service: Cardiovascular;  Laterality: Left;   BIV ICD GENERATOR CHANGEOUT N/A 11/24/2016   Procedure: BIV ICD GENERATOR CHANGEOUT;  Surgeon: Tammie Fall, MD;  Location: [facility] INVASIVE CV LAB;  Service: Cardiovascular;  Laterality: N/A;   BREAST LUMPECTOMY WITH RADIOACTIVE SEED LOCALIZATION Left 01/22/2020   Procedure: LEFT BREAST LUMPECTOMY WITH RADIOACTIVE SEED LOCALIZATION;  Surgeon: Dareen Ebbing, MD;  Location: [facility];  Service: General;  Laterality: Left;  LMA   CERVICAL ABLATION  03/29/2023   CHOLECYSTECTOMY  ~1996   FOOT NEUROMA SURGERY  ~ 2003   right   RE-EXCISION OF BREAST LUMPECTOMY Left 03/03/2020   Procedure: RE-EXCISION MARGINS OF LEFT BREAST LUMPECTOMY;  Surgeon: Dareen Ebbing, MD;  Location: [facility];  Service: General;  Laterality: Left;   ROTATOR CUFF REPAIR  ~ 2009   left   SHOULDER ARTHROSCOPY W/ ROTATOR CUFF REPAIR Right 09/21/2011   SKIN CANCER  EXCISION     nose, forehead, left leg   TEE WITHOUT CARDIOVERSION N/A 03/01/2017   Procedure: TRANSESOPHAGEAL ECHOCARDIOGRAM (TEE);  Surgeon: Maudine Sos, MD;  Location: [facility] ENDOSCOPY;  Service: Cardiovascular;  Laterality: N/A;   THYROID  LOBECTOMY  ~ 2006   right   TUBAL LIGATION  ~ 1983   VAGINAL HYSTERECTOMY  ~ 2009   VESICOVAGINAL FISTULA CLOSURE W/ TAH     Patient Active Problem List   Diagnosis Date Noted   Encounter for procedure for purposes other than remedying health state, unspecified 05/09/2023   Cellulitis of right lower extremity 02/03/2023   Delayed healing of traumatic wound 02/03/2023   Biventricular ICD (implantable  cardioverter-defibrillator) in place    DOE (dyspnea on exertion) 04/07/2021   Age-related osteoporosis without current pathological fracture 11/29/2020   Atrophy of vagina 11/29/2020   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   Breast CA (HCC) 02/18/2020   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   Rosacea 11/05/2019   Scar 11/05/2019   Biventricular cardiac pacemaker in situ 10/08/2018   Long term current use of anticoagulant therapy 10/23/2017   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   Trigeminal neuralgia 12/14/2012   Insomnia, persistent 11/03/2011   AICD (automatic cardioverter/defibrillator) present    Aortic regurgitation    Nonischemic cardiomyopathy (HCC) 01/21/2011   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/17/2010   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   Esophagitis, reflux 08/24/2010   Idiopathic peripheral neuropathy 08/24/2010   Chronic systolic CHF (congestive heart failure) (HCC) 02/24/2010   Paroxysmal atrial fibrillation (HCC) 02/23/2010   Hypothyroidism    History of migraine headaches    Left bundle branch block    Idiopathic scoliosis and kyphoscoliosis     Class: Chronic   Lumbar canal stenosis 12/25/2009   Menopause 04/15/2009   Anxiety state 07/24/2008   Avitaminosis D 03/21/2008   Atypical migraine 01/30/2008   Benign essential HTN 01/30/2008   Chronic migraine w/o aura w/o status migrainosus, not intractable 01/30/2008   Paroxysmal digital cyanosis 02/15/2007   Raynaud's phenomenon 02/15/2007    PCP: Persons, Norma Beckers, [place]  REFERRING PROVIDER: Persons, Norma Beckers, [place]  REFERRING DIAG: M81.0 (ICD-10-CM) - Age-related osteoporosis without current pathological fracture  THERAPY DIAG:  Cramp and spasm  Muscle weakness (generalized)  Abnormal posture  Chronic left shoulder pain  Stiffness of left shoulder, not elsewhere classified  Rationale for Evaluation and Treatment:  Rehabilitation  ONSET DATE: 04/24/2023  SUBJECTIVE:  SUBJECTIVE STATEMENT: Patient reports she saw Dr. Rochelle Chu.  He did more xrays of her neck and discussed options.  He is concerned over attempting a fusion given her T scores for osteoporosis.  But he did offer a disc procedure that would be a safer option.  He would like for her to start her infusions for her bone density and come back for f/u in 2 months to see if her T score is improved.    Eval:  This is patient's second episode recently.  She reports she continues to have neck pain and left shoulder pain and has been diagnosed with osteoporosis.  She has not done her exercises from last episode of PT.  She was afraid that they were hurting her.  She expresses concern over having any invasive treatment due to a bad experience in her past.  She has pacemaker as well.  She hopes to gain strength and reduce her overall pain.   Hand dominance: Right  PERTINENT HISTORY:  Long hx of neck and left shoulder pain, has been advised to have left shoulder replacement  PAIN:  Are you having pain? Yes: NPRS scale: 7/10  Pain location: neck and shoulder  Pain description: aching, tight Aggravating factors: driving, reaching overhead Relieving factors: Not using the left arm  PRECAUTIONS: None  RED FLAGS: None     WEIGHT BEARING RESTRICTIONS: No  FALLS:  Has patient fallen in last 6 months? Yes. Number of falls 1  LIVING ENVIRONMENT: Lives with: lives with their spouse Lives in: House/apartment   OCCUPATION: retired  PLOF: Independent, Independent with basic ADLs, Independent with household mobility without device, Independent with community mobility without device, Independent with homemaking with ambulation, Independent with gait, and  Independent with transfers  PATIENT GOALS: to eliminate pain  NEXT MD VISIT: prn  OBJECTIVE:  Note: Objective measures were completed at Evaluation unless otherwise noted.  DIAGNOSTIC FINDINGS:  IMPRESSION: 1. Fused thoracic scoliosis with compensatory cervical curvature and multilevel degeneration. 2. Moderate foraminal impingement bilaterally at C5-6 and C6-7, similar to 08/30/2019 cervical MRI. The spinal canal appears widely patent.  COGNITION: Overall cognitive status: Within functional limits for tasks assessed  SENSATION: WFL  POSTURE: rounded shoulders, forward head, and thoracic fusion and scoliosis  PALPATION: Severe fascial restriction and trigger points bilateral upper traps   CERVICAL ROM:   Active ROM A/PROM (deg) eval  Flexion 30  Extension 20  Right lateral flexion 12  Left lateral flexion 8  Right rotation 30  Left rotation 20   (Blank rows = not tested)  UPPER EXTREMITY ROM:  Left UE significantly restricted due to end stage OA.  Provider has suggested shoulder replacement.   UPPER EXTREMITY MMT:  MMT Right eval Left eval  Shoulder flexion    Shoulder extension    Shoulder abduction    Shoulder adduction    Shoulder extension    Shoulder internal rotation    Shoulder external rotation    Middle trapezius    Lower trapezius    Elbow flexion    Elbow extension    Wrist flexion    Wrist extension    Wrist ulnar deviation    Wrist radial deviation    Wrist pronation    Wrist supination    Grip strength     (Blank rows = not tested)   FUNCTIONAL TESTS:  5 times sit to stand: 10.51 sec Timed up and go (TUG): 9.49 sec    TREATMENT DATE: 06/08/23 Nustep x 5 min level 3  Lengthy discussion about her visit with Dr. Rochelle Chu: options, controlling stress level Cervical traction at limited pull due to osteoporosis 5 lb min, 10 lb max, 15 sec hold, 5 sec rest, 2 steps x 15 min  TREATMENT DATE: 06/06/23 Nustep x 5 min level 3 Supine AAROM  left shoulder all planes of motion MELT technique for C spine: rotations x 20, flex/ext x 20, figure 8's x 20 Lengthy discussion about managing pain and stress.  To see Dr. Rochelle Chu tomorrow for neuro consult   TREATMENT DATE: 05/22/23 Nustep x 5 min level 3 Sit to stand x 10 added balance pad due to hip weakness, patient was struggling to come up safely Seated LAQ 2 x 10 with 4 lbs both Seated march x 20 with 4 lbs both Seated hip ER 2 x 10 with 0 lbs both Seated punches with 2 lb dumbbells x 20 alternating Seated steam engines with 2 lb dumbbells x 20  Patient began to discuss a very stressful situation she is in with her sister who is having dementia issues and lives with a roommate who has dementia and she (the patient) is the only available caregiver and she is not healthy enough to be available for them and neither have children or any other available caregivers.  She is considering moving due to the stress.  She describes multiple very serious situations that she has tried to help handle.  The two in need of help keep running off any help she hires to help them because the sister leaves the home and they are unable to get in.  The sister has been told she is not supposed to be driving.   PT educated on how stress can contribute to muscle tension and pain creating elevated symptoms of an existing diagnosis. Discussed some options and encouraged patient to discuss with primary MD.    TREATMENT DATE: 05/10/23 Nu-Step L3 8 min Osteoporosis education-handout given see patient instructions Impact ex's for UE (T score for left radius = osteoporosis): clapping, thigh patting  Impact ex for LE (T score for femur= osteopenia): marching, heel drops Sit to stand from mat + cushion 5x (pt able to rise without UE assist at this height) (added to HEP) Seated 2# wrist extension 10x (added to HEP) Seated 2# wrist pronation supination 10x (added to HEP) Farmer's carry both 2# weights 40 feet x2 Pt has 2#  weights at home   TREATMENT DATE: 05/10/23 Initial eval completed Education on end stage OA and how this contributes to her neck pain,  educated on anatomy of the shoulder and c spine, educated on limits of PT when there are end stage degenerative changes.   MD has suggested shoulder replacement and that she will likely continue to struggle with the pain since we focused on this last episode and she did not see significant improvement.  PATIENT EDUCATION:  Education details: See above Person educated: Patient Education method: Explanation Education comprehension: verbalized understanding  HOME EXERCISE PROGRAM: Access Code: [email or web address] URL: [email or web address] Date: 05/12/2023 Prepared by: Darien Eden  Exercises - Sit to Stand  - 2 x daily - 7 x weekly - 1 sets - 5 reps - Wrist Extension with Dumbbell  - 1 x daily - 7 x weekly - 1 sets - 10 reps - Seated Pronation Supination with Dumbbell  - 1 x daily - 7 x weekly - 1 sets - 10 reps - Farmer's Carry with Kettlebells  - 1 x daily - 7 x weekly - 1 sets - 10 reps Begin next visjt, spent most of session educating and discussing options  ASSESSMENT:  CLINICAL IMPRESSION: Liberti met with neurosurgeon.  He is concerned over attempting a fusion given her T scores for osteoporosis.  But he did offer a disc procedure that would be a safer option.  He would like for her to start her infusions for her bone density and come back for f/u in 2 months to see if her T score is improved.  We tried traction today with consideration for her T score.  She seemed to respond well to this.  We had another long discussion about the stress she is under with caring for her sister and how much this is likely impacting her overall health.  She will continue her HEP.  She is intermittently compliant with her HEP.  She would  benefit from continued skilled PT to address severe neck pain and guarding.    OBJECTIVE IMPAIRMENTS: Abnormal gait, cardiopulmonary status limiting activity, decreased balance, decreased endurance, difficulty walking, decreased ROM, decreased strength, hypomobility, increased fascial restrictions, increased muscle spasms, impaired flexibility, postural dysfunction, and pain.   ACTIVITY LIMITATIONS: carrying, lifting, bending, sitting, standing, squatting, sleeping, stairs, transfers, bed mobility, continence, bathing, toileting, dressing, reach over head, hygiene/grooming, and caring for others  PARTICIPATION LIMITATIONS: meal prep, cleaning, laundry, driving, shopping, and community activity  PERSONAL FACTORS: Behavior pattern, Fitness, Past/current experiences, Time since onset of injury/illness/exacerbation, and 3+ comorbidities: scoliosis, htn, pacemaker, and CHF are also affecting patient's functional outcome.   REHAB POTENTIAL: Fair due to recent episode and still opting to decline surgery  CLINICAL DECISION MAKING: Unstable/unpredictable  EVALUATION COMPLEXITY: High   GOALS: Goals reviewed with patient? Yes  SHORT TERM GOALS: Target date: 06/07/2023  Pain report to be no greater than 4/10  Baseline:  Goal status: In progress  2.  Patient will be independent with initial HEP  Baseline:  Goal status: In progress   LONG TERM GOALS: Target date: 07/05/2023  Patient to report pain no greater than 2/10  Baseline:  Goal status: INITIAL  2.  Patient to be independent with advanced HEP  Baseline:  Goal status: INITIAL  3.  Patient to demonstrate consistency with some level of exercise Baseline:  Goal status: INITIAL  4.  C spine ROM to improve by 2-3 degrees each direction Baseline:  Goal status: INITIAL  5.  Patient to be able to stand or walk for at least 15 min without leg pain  Baseline:  Goal status: INITIAL  6.  Patient to have consult with orthopedic  provider Baseline:  Goal status: INITIAL   PLAN:  PT FREQUENCY: 1-2x/week  PT DURATION: 8 weeks  PLANNED INTERVENTIONS: 97110-Therapeutic exercises, 97530- Therapeutic activity, [id number]- Neuromuscular re-education, 97535- Self Care, 16109- Manual therapy, [id number]- Gait training, [phone]- Canalith repositioning, [id number]- Aquatic Therapy, [id number]- Electrical stimulation (unattended), [id number]- Electrical stimulation (  manual), [id number]- Vasopneumatic device, [id number]- Ultrasound, [id number]- Ionotophoresis 4mg /ml Dexamethasone , Patient/Family education, Balance training, Stair training, Taping, Dry Needling, Joint mobilization, Spinal mobilization, Vestibular training, Visual/preceptual remediation/compensation, Cryotherapy, and Moist heat  PLAN FOR NEXT SESSION: Nustep, cervical traction: limited pull weight due to osteoporosis, left shoulder ROM, MELT    Dace Denn B. Joseline Mccampbell, PT 06/08/23 12:29 PM [facility] [address] [place], [place] [address] Phone # [phone] Fax [phone]

## 2023-06-08 NOTE — Telephone Encounter (Signed)
 Patient called she would like to speak with Norma Beckers about starting medication for her osteoporosis. Her cb# [phone]

## 2023-06-08 NOTE — Telephone Encounter (Signed)
 Callled and spoke to the patient she will be coming in for her Evenity injections on June 6  will have Christy order medication Patient is to pay $60.00

## 2023-06-08 NOTE — Telephone Encounter (Signed)
 [name] called with request to stop her anastrozole  due to osteoporosis (started 05/2020 w/goal to take x 5 years). Having a lot of neck pain and has seen surgeon, who is concerned about her osteoporosis and she wants to stop anastrozole  to allow bone repair and allow her to have surgery. Called her back and left VM asking if she was still getting Zometa  every 6 months?

## 2023-06-09 NOTE — Telephone Encounter (Signed)
 Left VM again asking if she is on Zometa  and who is administering treatment?

## 2023-06-12 ENCOUNTER — Encounter: Payer: Self-pay | Admitting: Nurse Practitioner

## 2023-06-12 NOTE — Telephone Encounter (Signed)
 [name] called that she has been getting the twice yearly Zometa . Ortho is starting her on SQ Evenity (romosozumab) on 06/16/23. She is going to stop her anastrozole  now to allow her bone density time to improve so she can have the surgery on her neck. Is in a lot of pain with this issue. Will relay her message to Dr. Scherrie Curt.

## 2023-06-12 NOTE — Telephone Encounter (Signed)
 Medication is here in the refridge

## 2023-06-13 ENCOUNTER — Ambulatory Visit: Attending: Physician Assistant | Admitting: Physical Therapy

## 2023-06-13 DIAGNOSIS — M25512 Pain in left shoulder: Secondary | ICD-10-CM | POA: Diagnosis not present

## 2023-06-13 DIAGNOSIS — R262 Difficulty in walking, not elsewhere classified: Secondary | ICD-10-CM | POA: Diagnosis not present

## 2023-06-13 DIAGNOSIS — M6281 Muscle weakness (generalized): Secondary | ICD-10-CM | POA: Diagnosis not present

## 2023-06-13 DIAGNOSIS — G8929 Other chronic pain: Secondary | ICD-10-CM | POA: Diagnosis not present

## 2023-06-13 DIAGNOSIS — R293 Abnormal posture: Secondary | ICD-10-CM | POA: Diagnosis not present

## 2023-06-13 DIAGNOSIS — R252 Cramp and spasm: Secondary | ICD-10-CM | POA: Insufficient documentation

## 2023-06-13 DIAGNOSIS — M25612 Stiffness of left shoulder, not elsewhere classified: Secondary | ICD-10-CM | POA: Insufficient documentation

## 2023-06-13 NOTE — Therapy (Signed)
 OUTPATIENT PHYSICAL THERAPY CERVICAL PROGRESS   Patient Name: [name] MRN: [id number] DOB:1955-01-13, 68 y.o., female Today's Date: 06/13/2023  END OF SESSION:  PT End of Session - 06/13/23 1114     Visit Number 6    Number of Visits 16    Date for PT Re-Evaluation 07/05/23    Authorization Type [employer] 16 visits  4/30-6/25    Authorization - Visit Number 6    Authorization - Number of Visits 16    Progress Note Due on Visit 10    PT Start Time 1112    PT Stop Time 1145    PT Time Calculation (min) 33 min    Activity Tolerance Patient limited by pain             Past Medical History:  Diagnosis Date   AF (paroxysmal atrial fibrillation) (HCC)    Arthritis    BCC (basal cell carcinoma) 03/11/2003   left distal lower leg ankle-tx dr Alanda Allegra   Biventricular cardiac pacemaker in situ    a. prior CRT-D in 2013 with downgrade to CRT-Pacemaker only in 11/2016.   Breast cancer (HCC) 12/12/2019   Chronic systolic CHF (congestive heart failure) (HCC)    Complication of anesthesia    aspiration   Essential tremor    GERD (gastroesophageal reflux disease)    otc   Hypertension    Hypothyroidism    LBBB (left bundle branch block)    conduction disorder   Migraines    Mild aortic insufficiency    NICM (nonischemic cardiomyopathy) (HCC)    a. 2012: normal coronaries, EF 25-30%. b. improved to 55% 11/2016.   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   in situ- right lower leg-anterior ([id number])   Scoliosis    Past Surgical History:  Procedure Laterality Date   ARTERY BIOPSY  12/29/2011   Procedure: MINOR BIOPSY TEMPORAL ARTERY;  Surgeon: Quitman Bucy, MD;  Location: [facility];  Service: General;  Laterality: Right;  Right  temporal artery biopsy   BACK SURGERY  12/31/2009   "for flat back syndrome; fused bottom of my back"   BI-VENTRICULAR IMPLANTABLE CARDIOVERTER DEFIBRILLATOR Left 01/20/2011   Procedure: BI-VENTRICULAR IMPLANTABLE CARDIOVERTER DEFIBRILLATOR  (CRT-D);  Surgeon: Tammie Fall, MD;  Location: [facility] CATH LAB;  Service: Cardiovascular;  Laterality: Left;   BIV ICD GENERATOR CHANGEOUT N/A 11/24/2016   Procedure: BIV ICD GENERATOR CHANGEOUT;  Surgeon: Tammie Fall, MD;  Location[facility] INVASIVE CV LAB;  Service: Cardiovascular;  Laterality: N/A;   BREAST LUMPECTOMY WITH RADIOACTIVE SEED LOCALIZATION Left 01/22/2020   Procedure: LEFT BREAST LUMPECTOMY WITH RADIOACTIVE SEED LOCALIZATION;  Surgeon: Dareen Ebbing, MD;  Location: [facility];  Service: General;  Laterality: Left;  LMA   CERVICAL ABLATION  03/29/2023   CHOLECYSTECTOMY  ~1996   FOOT NEUROMA SURGERY  ~ 2003   right   RE-EXCISION OF BREAST LUMPECTOMY Left 03/03/2020   Procedure: RE-EXCISION MARGINS OF LEFT BREAST LUMPECTOMY;  Surgeon: Dareen Ebbing, MD;  Location: [facility];  Service: General;  Laterality: Left;   ROTATOR CUFF REPAIR  ~ 2009   left   SHOULDER ARTHROSCOPY W/ ROTATOR CUFF REPAIR Right 09/21/2011   SKIN CANCER EXCISION     nose, forehead, left  leg   TEE WITHOUT CARDIOVERSION N/A 03/01/2017   Procedure: TRANSESOPHAGEAL ECHOCARDIOGRAM (TEE);  Surgeon: Maudine Sos, MD;  Location: [facility] ENDOSCOPY;  Service: Cardiovascular;  Laterality: N/A;   THYROID  LOBECTOMY  ~ 2006   right   TUBAL LIGATION  ~ 1983   VAGINAL HYSTERECTOMY  ~ 2009   VESICOVAGINAL FISTULA CLOSURE W/ TAH     Patient Active Problem List   Diagnosis Date Noted   Encounter for procedure for purposes other than remedying health state, unspecified 05/09/2023   Cellulitis of right lower extremity 02/03/2023   Delayed healing of traumatic wound 02/03/2023   Biventricular ICD (implantable cardioverter-defibrillator) in place    DOE  (dyspnea on exertion) 04/07/2021   Age-related osteoporosis without current pathological fracture 11/29/2020   Atrophy of vagina 11/29/2020   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   Breast CA (HCC) 02/18/2020   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   Rosacea 11/05/2019   Scar 11/05/2019   Biventricular cardiac pacemaker in situ 10/08/2018   Long term current use of anticoagulant therapy 10/23/2017   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   Trigeminal neuralgia 12/14/2012   Insomnia, persistent 11/03/2011   AICD (automatic cardioverter/defibrillator) present    Aortic regurgitation    Nonischemic cardiomyopathy (HCC) 01/21/2011   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/17/2010   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   Esophagitis, reflux 08/24/2010   Idiopathic peripheral neuropathy 08/24/2010   Chronic systolic CHF (congestive heart failure) (HCC) 02/24/2010   Paroxysmal atrial fibrillation (HCC) 02/23/2010   Hypothyroidism    History of migraine headaches    Left bundle branch block    Idiopathic scoliosis and kyphoscoliosis     Class: Chronic   Lumbar canal stenosis 12/25/2009   Menopause 04/15/2009   Anxiety state 07/24/2008   Avitaminosis D 03/21/2008   Atypical migraine 01/30/2008   Benign essential HTN 01/30/2008   Chronic migraine w/o aura w/o status migrainosus, not intractable 01/30/2008   Paroxysmal digital cyanosis 02/15/2007   Raynaud's phenomenon 02/15/2007    PCP: Persons, Norma Beckers, [place]  REFERRING PROVIDER: Persons, Norma Beckers, [place]  REFERRING DIAG: M81.0 (ICD-10-CM) - Age-related osteoporosis without current pathological fracture  THERAPY DIAG:  Cramp and spasm  Muscle weakness (generalized)  Abnormal posture  Rationale for Evaluation and Treatment: Rehabilitation  ONSET DATE: 04/24/2023  SUBJECTIVE:  SUBJECTIVE STATEMENT: Pt got appt times confused and added to another therapist's schedule.  I think the lying ex's would be good.  I have trouble holding my head up when I'm shopping.   Last visit:  Patient reports she saw Dr. Rochelle Chu.  He did more xrays of her neck and discussed options.  He is concerned over attempting a fusion given her T scores for osteoporosis.  But he did offer a disc procedure that would be a safer option.  He would like for her to start her infusions for her bone density and come back for f/u in 2 months to see if her T score is improved.    Eval:  This is patient's second episode recently.  She reports she continues to have neck pain and left shoulder pain and has been diagnosed with osteoporosis.  She has not done her exercises from last episode of PT.  She was afraid that they were hurting her.  She expresses concern over having any invasive treatment due to a bad experience in her past.  She has pacemaker as well.  She hopes to gain strength and reduce her overall pain.   Hand dominance: Right  PERTINENT HISTORY:  Long hx of neck and left shoulder pain, has been advised to have left shoulder replacement  PAIN:  6/3: Are you having pain? Yes: NPRS scale: 3/10  Pain location: neck and shoulder  Pain description: aching, tight Aggravating factors: driving, reaching overhead Relieving factors: Not using the left arm  PRECAUTIONS: None  RED FLAGS: None     WEIGHT BEARING RESTRICTIONS: No  FALLS:  Has patient fallen in last 6 months? Yes. Number of falls 1  LIVING ENVIRONMENT: Lives with: lives with their spouse Lives in: House/apartment   OCCUPATION: retired  PLOF: Independent, Independent with basic ADLs, Independent with household mobility without device, Independent with community mobility without device, Independent with homemaking with ambulation,  Independent with gait, and Independent with transfers  PATIENT GOALS: to eliminate pain  NEXT MD VISIT: prn  OBJECTIVE:  Note: Objective measures were completed at Evaluation unless otherwise noted.  DIAGNOSTIC FINDINGS:  IMPRESSION: 1. Fused thoracic scoliosis with compensatory cervical curvature and multilevel degeneration. 2. Moderate foraminal impingement bilaterally at C5-6 and C6-7, similar to 08/30/2019 cervical MRI. The spinal canal appears widely patent.  COGNITION: Overall cognitive status: Within functional limits for tasks assessed  SENSATION: WFL  POSTURE: rounded shoulders, forward head, and thoracic fusion and scoliosis  PALPATION: Severe fascial restriction and trigger points bilateral upper traps   CERVICAL ROM:   Active ROM A/PROM (deg) eval  Flexion 30  Extension 20  Right lateral flexion 12  Left lateral flexion 8  Right rotation 30  Left rotation 20   (Blank rows = not tested)  UPPER EXTREMITY ROM:  Left UE significantly restricted due to end stage OA.  Provider has suggested shoulder replacement.   UPPER EXTREMITY MMT:  MMT Right eval Left eval  Shoulder flexion    Shoulder extension    Shoulder abduction    Shoulder adduction    Shoulder extension    Shoulder internal rotation    Shoulder external rotation    Middle trapezius    Lower trapezius    Elbow flexion    Elbow extension    Wrist flexion    Wrist extension    Wrist ulnar deviation    Wrist radial deviation    Wrist pronation    Wrist supination    Grip strength     (  Blank rows = not tested)   FUNCTIONAL TESTS:  5 times sit to stand: 10.51 sec Timed up and go (TUG): 9.49 sec   TREATMENT DATE: 06/13/23 Nustep x 6 min level 5 while discussing status Supine cane press 2# 5x 5 sec hold Supine cane overhead 2# 5x Supine ball squeeze between palms 5 sec hold 15x Supine shoulder external rotation yellow band 10x Supine single arm extension with other arm fixed at 90  degrees isometric hold 10x right/left Supine yellow band around wrists steering wheel turns  at 90 degrees shoulder flexion 10x Supine cervical retraction against therapist hand behind the head for tactile cue, pillow + foam 10x (Added to HEP- see below) Seated cervical head press into yellow band 10x 2 (Added to HEP- see below) given yellow band for home use Seated small ball on thighs: UE push down on ball for upper quarter stabilization 10x 5 sec holds  TREATMENT DATE: 06/08/23 Nustep x 5 min level 3 Lengthy discussion about her visit with Dr. Rochelle Chu: options, controlling stress level Cervical traction at limited pull due to osteoporosis 5 lb min, 10 lb max, 15 sec hold, 5 sec rest, 2 steps x 15 min  TREATMENT DATE: 06/06/23 Nustep x 5 min level 3 Supine AAROM left shoulder all planes of motion MELT technique for C spine: rotations x 20, flex/ext x 20, figure 8's x 20 Lengthy discussion about managing pain and stress.  To see Dr. Rochelle Chu tomorrow for neuro consult   TREATMENT DATE: 05/22/23 Nustep x 5 min level 3 Sit to stand x 10 added balance pad due to hip weakness, patient was struggling to come up safely Seated LAQ 2 x 10 with 4 lbs both Seated march x 20 with 4 lbs both Seated hip ER 2 x 10 with 0 lbs both Seated punches with 2 lb dumbbells x 20 alternating Seated steam engines with 2 lb dumbbells x 20  Patient began to discuss a very stressful situation she is in with her sister who is having dementia issues and lives with a roommate who has dementia and she (the patient) is the only available caregiver and she is not healthy enough to be available for them and neither have children or any other available caregivers.  She is considering moving due to the stress.  She describes multiple very serious situations that she has tried to help handle.  The two in need of help keep running off any help she hires to help them because the sister leaves the home and they are unable to get in.  The  sister has been told she is not supposed to be driving.   PT educated on how stress can contribute to muscle tension and pain creating elevated symptoms of an existing diagnosis. Discussed some options and encouraged patient to discuss with primary MD.    TREATMENT DATE: 05/10/23 Nu-Step L3 8 min Osteoporosis education-handout given see patient instructions Impact ex's for UE (T score for left radius = osteoporosis): clapping, thigh patting  Impact ex for LE (T score for femur= osteopenia): marching, heel drops Sit to stand from mat + cushion 5x (pt able to rise without UE assist at this height) (added to HEP) Seated 2# wrist extension 10x (added to HEP) Seated 2# wrist pronation supination 10x (added to HEP) Farmer's carry both 2# weights 40 feet x2 Pt has 2# weights at home   TREATMENT DATE: 05/10/23 Initial eval completed Education on end stage OA and how this contributes to her neck pain,  educated on anatomy of the shoulder and c spine, educated on limits of PT when there are end stage degenerative changes.   MD has suggested shoulder replacement and that she will likely continue to struggle with the pain since we focused on this last episode and she did not see significant improvement.                                                                                                                                  PATIENT EDUCATION:  Education details: See above Person educated: Patient Education method: Explanation Education comprehension: verbalized understanding  HOME EXERCISE PROGRAM: Access Code: [email or web address] URL: [email or web address] Date: 06/13/2023 Prepared by: Darien Eden  Exercises - Sit to Stand  - 2 x daily - 7 x weekly - 1 sets - 5 reps - Wrist Extension with Dumbbell  - 1 x daily - 7 x weekly - 1 sets - 10 reps - Seated Pronation Supination with Dumbbell  - 1 x daily - 7 x weekly - 1 sets - 10 reps - Farmer's Carry with Kettlebells  - 1 x daily - 7 x  weekly - 1 sets - 10 reps - Supine Cervical Retraction with Towel  - 1 x daily - 7 x weekly - 1 sets - 10 reps - 5 hold - Cervical Retraction with Resistance  - 1 x daily - 7 x weekly - 1 sets - 10 reps - 5 hold  ASSESSMENT:  CLINICAL IMPRESSION: Treatment focus on isometric style exercises for pain control and to improve cervical strength needed to hold head erect for longer periods of time.  She reports no increase in pain with ex's including cervical retraction isometrics in supine and sitting.  Therapist providing verbal and tactile cues to optimize technique with  exercises in order to achieve the greatest benefit.      Last visit:  Shamya met with neurosurgeon.  He is concerned over attempting a fusion given her T scores for osteoporosis.  But he did offer a disc procedure that would be a safer option.  He would like for her to start her infusions for her bone density and come back for f/u in 2 months to see if her T score is improved.  We tried traction today with consideration for her T score.  She seemed to respond well to this.  We had another long discussion about the stress she is under with caring for her sister and how much this is likely impacting her overall health.  She will continue her HEP.  She is intermittently compliant with her HEP.  She would benefit from continued skilled PT to address severe neck pain and guarding.    OBJECTIVE IMPAIRMENTS: Abnormal gait, cardiopulmonary status limiting activity, decreased balance, decreased endurance, difficulty walking, decreased ROM, decreased strength, hypomobility, increased fascial restrictions, increased muscle spasms, impaired flexibility, postural dysfunction, and pain.   ACTIVITY LIMITATIONS: carrying, lifting, bending, sitting, standing, squatting, sleeping,  stairs, transfers, bed mobility, continence, bathing, toileting, dressing, reach over head, hygiene/grooming, and caring for others  PARTICIPATION LIMITATIONS: meal prep,  cleaning, laundry, driving, shopping, and community activity  PERSONAL FACTORS: Behavior pattern, Fitness, Past/current experiences, Time since onset of injury/illness/exacerbation, and 3+ comorbidities: scoliosis, htn, pacemaker, and CHF are also affecting patient's functional outcome.   REHAB POTENTIAL: Fair due to recent episode and still opting to decline surgery  CLINICAL DECISION MAKING: Unstable/unpredictable  EVALUATION COMPLEXITY: High   GOALS: Goals reviewed with patient? Yes  SHORT TERM GOALS: Target date: 06/07/2023  Pain report to be no greater than 4/10  Baseline:  Goal status: In progress  2.  Patient will be independent with initial HEP  Baseline:  Goal status: In progress   LONG TERM GOALS: Target date: 07/05/2023  Patient to report pain no greater than 2/10  Baseline:  Goal status: INITIAL  2.  Patient to be independent with advanced HEP  Baseline:  Goal status: INITIAL  3.  Patient to demonstrate consistency with some level of exercise Baseline:  Goal status: INITIAL  4.  C spine ROM to improve by 2-3 degrees each direction Baseline:  Goal status: INITIAL  5.  Patient to be able to stand or walk for at least 15 min without leg pain  Baseline:  Goal status: INITIAL  6.  Patient to have consult with orthopedic provider Baseline:  Goal status: INITIAL   PLAN:  PT FREQUENCY: 1-2x/week  PT DURATION: 8 weeks  PLANNED INTERVENTIONS: 97110-Therapeutic exercises, 97530- Therapeutic activity, 97112- Neuromuscular re-education, [phone]- Self Care, 46962- Manual therapy, [phone]- Gait training, [phone]- Canalith repositioning, [id number]- Aquatic Therapy, [phone]- Electrical stimulation (unattended), [phone]- Electrical stimulation (manual), [id number]- Vasopneumatic device, [id number]- Ultrasound, [id number]- Ionotophoresis 4mg /ml Dexamethasone , Patient/Family education, Balance training, Stair training, Taping, Dry Needling, Joint mobilization, Spinal mobilization, Vestibular  training, Visual/preceptual remediation/compensation, Cryotherapy, and Moist heat  PLAN FOR NEXT SESSION: assess response to cervical retraction/head presses in supine and sitting; Nustep, cervical traction: limited pull weight due to osteoporosis, left shoulder ROM, MELT    Darien Eden, PT 06/13/23 2:51 PM Phone: [phone] Fax: [phone]  [facility] [address] [place], [place] [address] Phone # [phone] Fax [phone]

## 2023-06-14 ENCOUNTER — Ambulatory Visit

## 2023-06-16 ENCOUNTER — Telehealth: Payer: Self-pay

## 2023-06-16 ENCOUNTER — Ambulatory Visit

## 2023-06-16 DIAGNOSIS — M818 Other osteoporosis without current pathological fracture: Secondary | ICD-10-CM | POA: Diagnosis not present

## 2023-06-16 DIAGNOSIS — M810 Age-related osteoporosis without current pathological fracture: Secondary | ICD-10-CM

## 2023-06-16 MED ORDER — ROMOSOZUMAB-AQQG 105 MG/1.17ML ~~LOC~~ SOSY: 210.0000 mg | PREFILLED_SYRINGE | SUBCUTANEOUS | Status: AC

## 2023-06-16 NOTE — Telephone Encounter (Signed)
 Patient came in today for her 1 st Evenity injection  patient tolerated well  Patient received an IM injection bilateral upper arm

## 2023-06-16 NOTE — Telephone Encounter (Signed)
 Phone call made to patient to let her know of missed appointment.  Patient answered.  She thought her appt was at 11:45.  She apologized.  She confirms that she will be at her next appt.

## 2023-06-20 ENCOUNTER — Ambulatory Visit: Admitting: Physical Therapy

## 2023-06-20 ENCOUNTER — Telehealth: Payer: Self-pay

## 2023-06-20 DIAGNOSIS — M25612 Stiffness of left shoulder, not elsewhere classified: Secondary | ICD-10-CM | POA: Diagnosis not present

## 2023-06-20 DIAGNOSIS — M6281 Muscle weakness (generalized): Secondary | ICD-10-CM

## 2023-06-20 DIAGNOSIS — G8929 Other chronic pain: Secondary | ICD-10-CM | POA: Diagnosis not present

## 2023-06-20 DIAGNOSIS — R252 Cramp and spasm: Secondary | ICD-10-CM

## 2023-06-20 DIAGNOSIS — R293 Abnormal posture: Secondary | ICD-10-CM | POA: Diagnosis not present

## 2023-06-20 DIAGNOSIS — R262 Difficulty in walking, not elsewhere classified: Secondary | ICD-10-CM | POA: Diagnosis not present

## 2023-06-20 DIAGNOSIS — M25512 Pain in left shoulder: Secondary | ICD-10-CM | POA: Diagnosis not present

## 2023-06-20 NOTE — Therapy (Signed)
 OUTPATIENT PHYSICAL THERAPY CERVICAL PROGRESS   Patient Name: [name] MRN: [id number] DOB:[birth year], 68 y.o., female Today's Date: 06/20/2023  END OF SESSION:  PT End of Session - 06/20/23 1137     Visit Number 7    Number of Visits 16    Date for PT Re-Evaluation 07/05/23    Authorization Type [employer] 16 visits  4/30-6/25    Authorization - Visit Number 7    Authorization - Number of Visits 16    Progress Note Due on Visit 10    PT Start Time 1140    PT Stop Time 1220    PT Time Calculation (min) 40 min    Activity Tolerance Patient tolerated treatment well             Past Medical History:  Diagnosis Date   AF (paroxysmal atrial fibrillation) (HCC)    Arthritis    BCC (basal cell carcinoma) 03/11/2003   left distal lower leg ankle-tx dr Alanda Allegra   Biventricular cardiac pacemaker in situ    a. prior CRT-D in 2013 with downgrade to CRT-Pacemaker only in 11/2016.   Breast cancer (HCC) 12/12/2019   Chronic systolic CHF (congestive heart failure) (HCC)    Complication of anesthesia    aspiration   Essential tremor    GERD (gastroesophageal reflux disease)    otc   Hypertension    Hypothyroidism    LBBB (left bundle branch block)    conduction disorder   Migraines    Mild aortic insufficiency    NICM (nonischemic cardiomyopathy) (HCC)    a. 2012: normal coronaries, EF 25-30%. b. improved to 55% 11/2016.   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   in situ- right lower leg-anterior ([id number])   Scoliosis    Past Surgical History:  Procedure Laterality Date   ARTERY BIOPSY  12/29/2011   Procedure: MINOR BIOPSY TEMPORAL ARTERY;  Surgeon: Quitman Bucy, MD;  Location: [facility];  Service: General;  Laterality: Right;   Right temporal artery biopsy   BACK SURGERY  12/31/2009   "for flat back syndrome; fused bottom of my back"   BI-VENTRICULAR IMPLANTABLE CARDIOVERTER DEFIBRILLATOR Left 01/20/2011   Procedure: BI-VENTRICULAR IMPLANTABLE CARDIOVERTER DEFIBRILLATOR  (CRT-D);  Surgeon: Tammie Fall, MD;  Location: [facility] CATH LAB;  Service: Cardiovascular;  Laterality: Left;   BIV ICD GENERATOR CHANGEOUT N/A 11/24/2016   Procedure: BIV ICD GENERATOR CHANGEOUT;  Surgeon: Tammie Fall, MD;  Location: [facility] INVASIVE CV LAB;  Service: Cardiovascular;  Laterality: N/A;   BREAST LUMPECTOMY WITH RADIOACTIVE SEED LOCALIZATION Left 01/22/2020   Procedure: LEFT BREAST LUMPECTOMY WITH RADIOACTIVE SEED LOCALIZATION;  Surgeon: Dareen Ebbing, MD;  Location: [facility];  Service: General;  Laterality: Left;  LMA   CERVICAL ABLATION  03/29/2023   CHOLECYSTECTOMY  ~1996   FOOT NEUROMA SURGERY  ~ 2003   right   RE-EXCISION OF BREAST LUMPECTOMY Left 03/03/2020   Procedure: RE-EXCISION MARGINS OF LEFT BREAST LUMPECTOMY;  Surgeon: Dareen Ebbing, MD;  Location: [facility];  Service: General;  Laterality: Left;   ROTATOR CUFF REPAIR  ~ 2009   left   SHOULDER ARTHROSCOPY W/ ROTATOR CUFF REPAIR Right 09/21/2011   SKIN CANCER EXCISION     nose, forehead, left  leg   TEE WITHOUT CARDIOVERSION N/A 03/01/2017   Procedure: TRANSESOPHAGEAL ECHOCARDIOGRAM (TEE);  Surgeon: Maudine Sos, MD;  Location: [facility] ENDOSCOPY;  Service: Cardiovascular;  Laterality: N/A;   THYROID  LOBECTOMY  ~ 2006   right   TUBAL LIGATION  ~ 1983   VAGINAL HYSTERECTOMY  ~ 2009   VESICOVAGINAL FISTULA CLOSURE W/ TAH     Patient Active Problem List   Diagnosis Date Noted   Encounter for procedure for purposes other than remedying health state, unspecified 05/09/2023   Cellulitis of right lower extremity 02/03/2023   Delayed healing of traumatic wound 02/03/2023   Biventricular ICD (implantable cardioverter-defibrillator) in place     DOE (dyspnea on exertion) 04/07/2021   Age-related osteoporosis without current pathological fracture 11/29/2020   Atrophy of vagina 11/29/2020   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   Breast CA (HCC) 02/18/2020   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   Rosacea 11/05/2019   Scar 11/05/2019   Biventricular cardiac pacemaker in situ 10/08/2018   Long term current use of anticoagulant therapy 10/23/2017   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   Trigeminal neuralgia 12/14/2012   Insomnia, persistent 11/03/2011   AICD (automatic cardioverter/defibrillator) present    Aortic regurgitation    Nonischemic cardiomyopathy (HCC) 01/21/2011   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/17/2010   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   Esophagitis, reflux 08/24/2010   Idiopathic peripheral neuropathy 08/24/2010   Chronic systolic CHF (congestive heart failure) (HCC) 02/24/2010   Paroxysmal atrial fibrillation (HCC) 02/23/2010   Hypothyroidism    History of migraine headaches    Left bundle branch block    Idiopathic scoliosis and kyphoscoliosis     Class: Chronic   Lumbar canal stenosis 12/25/2009   Menopause 04/15/2009   Anxiety state 07/24/2008   Avitaminosis D 03/21/2008   Atypical migraine 01/30/2008   Benign essential HTN 01/30/2008   Chronic migraine w/o aura w/o status migrainosus, not intractable 01/30/2008   Paroxysmal digital cyanosis 02/15/2007   Raynaud's phenomenon 02/15/2007    PCP: Persons, Norma Beckers, [place]  REFERRING PROVIDER: Persons, Norma Beckers, [place]  REFERRING DIAG: M81.0 (ICD-10-CM) - Age-related osteoporosis without current pathological fracture  THERAPY DIAG:  Cramp and spasm  Muscle weakness (generalized)  Abnormal posture  Rationale for Evaluation and Treatment: Rehabilitation  ONSET DATE: 04/24/2023  SUBJECTIVE:  SUBJECTIVE STATEMENT: Some soreness the next day after last visit.  I need to work on getting up and down and I liked the things to hold my head up.  I've had some ups and downs with my neck.  Did a lot of housework Monday and I overdid it and by lunch time I felt it.  Last Friday they gave me the osteoporosis shots in my arms (will get every month).   Last visit:  Patient reports she saw Dr. Rochelle Chu.  He did more xrays of her neck and discussed options.  He is concerned over attempting a fusion given her T scores for osteoporosis.  But he did offer a disc procedure that would be a safer option.  He would like for her to start her infusions for her bone density and come back for f/u in 2 months to see if her T score is improved.    Eval:  This is patient's second episode recently.  She reports she continues to have neck pain and left shoulder pain and has been diagnosed with osteoporosis.  She has not done her exercises from last episode of PT.  She was afraid that they were hurting her.  She expresses concern over having any invasive treatment due to a bad experience in her past.  She has pacemaker as well.  She hopes to gain strength and reduce her overall pain.   Hand dominance: Right  PERTINENT HISTORY:  Long hx of neck and left shoulder pain, has been advised to have left shoulder replacement  PAIN:  6/10: Are you having pain? Yes: NPRS scale: 3/10  Pain location: neck and shoulder, left foot and leg   Pain description: aching, tight Aggravating factors: driving, reaching overhead Relieving factors: Not using the left arm  PRECAUTIONS: None  RED FLAGS: None     WEIGHT BEARING RESTRICTIONS: No  FALLS:  Has patient fallen in last 6 months? Yes. Number of falls 1  LIVING ENVIRONMENT: Lives with: lives with their spouse Lives in: House/apartment   OCCUPATION:  retired  PLOF: Independent, Independent with basic ADLs, Independent with household mobility without device, Independent with community mobility without device, Independent with homemaking with ambulation, Independent with gait, and Independent with transfers  PATIENT GOALS: to eliminate pain  NEXT MD VISIT: prn  OBJECTIVE:  Note: Objective measures were completed at Evaluation unless otherwise noted.  DIAGNOSTIC FINDINGS:  IMPRESSION: 1. Fused thoracic scoliosis with compensatory cervical curvature and multilevel degeneration. 2. Moderate foraminal impingement bilaterally at C5-6 and C6-7, similar to 08/30/2019 cervical MRI. The spinal canal appears widely patent.  COGNITION: Overall cognitive status: Within functional limits for tasks assessed  SENSATION: WFL  POSTURE: rounded shoulders, forward head, and thoracic fusion and scoliosis  PALPATION: Severe fascial restriction and trigger points bilateral upper traps   CERVICAL ROM:   Active ROM A/PROM (deg) eval  Flexion 30  Extension 20  Right lateral flexion 12  Left lateral flexion 8  Right rotation 30  Left rotation 20   (Blank rows = not tested)  UPPER EXTREMITY ROM:  Left UE significantly restricted due to end stage OA.  Provider has suggested shoulder replacement.   UPPER EXTREMITY MMT:  MMT Right eval Left eval  Shoulder flexion    Shoulder extension    Shoulder abduction    Shoulder adduction    Shoulder extension    Shoulder internal rotation    Shoulder external rotation    Middle trapezius    Lower trapezius    Elbow  flexion    Elbow extension    Wrist flexion    Wrist extension    Wrist ulnar deviation    Wrist radial deviation    Wrist pronation    Wrist supination    Grip strength     (Blank rows = not tested)   FUNCTIONAL TESTS:  5 times sit to stand: 10.51 sec Timed up and go (TUG): 9.49 sec   TREATMENT DATE: 06/20/23 Nustep x 10 min level 5 while discussing status Supine  cane press 2# steering wheels 10x Supine ball squeeze fist to palm 5 sec hold 10x Supine ball squeeze with bil elevation 10x Supine steering wheels with cane+ 2# 10x Supine cervical retraction against therapist hand behind the head for tactile cue, pillow + foam 10x Supine shoulder external rotation yellow band 10x Sit to stand mat table + foam 5x  (cues for patellofemoral alignment) Grip strength 35# right/left Seated cervical head press into yellow band 10x  Seated head push into purple ball on wall 10x 5 sec hold  TREATMENT DATE: 06/13/23 Nustep x 6 min level 5 while discussing status Supine cane press 2# 5x 5 sec hold Supine cane overhead 2# 5x Supine ball squeeze between palms 5 sec hold 15x Supine shoulder external rotation yellow band 10x Supine single arm extension with other arm fixed at 90 degrees isometric hold 10x right/left Supine yellow band around wrists steering wheel turns  at 90 degrees shoulder flexion 10x Supine cervical retraction against therapist hand behind the head for tactile cue, pillow + foam 10x (Added to HEP- see below) Seated cervical head press into yellow band 10x 2 (Added to HEP- see below) given yellow band for home use Seated small ball on thighs: UE push down on ball for upper quarter stabilization 10x 5 sec holds  TREATMENT DATE: 06/08/23 Nustep x 5 min level 3 Lengthy discussion about her visit with Dr. Rochelle Chu: options, controlling stress level Cervical traction at limited pull due to osteoporosis 5 lb min, 10 lb max, 15 sec hold, 5 sec rest, 2 steps x 15 min  TREATMENT DATE: 06/06/23 Nustep x 5 min level 3 Supine AAROM left shoulder all planes of motion MELT technique for C spine: rotations x 20, flex/ext x 20, figure 8's x 20 Lengthy discussion about managing pain and stress.  To see Dr. Rochelle Chu tomorrow for neuro consult   TREATMENT DATE: 05/22/23 Nustep x 5 min level 3 Sit to stand x 10 added balance pad due to hip weakness, patient was  struggling to come up safely Seated LAQ 2 x 10 with 4 lbs both Seated march x 20 with 4 lbs both Seated hip ER 2 x 10 with 0 lbs both Seated punches with 2 lb dumbbells x 20 alternating Seated steam engines with 2 lb dumbbells x 20  Patient began to discuss a very stressful situation she is in with her sister who is having dementia issues and lives with a roommate who has dementia and she (the patient) is the only available caregiver and she is not healthy enough to be available for them and neither have children or any other available caregivers.  She is considering moving due to the stress.  She describes multiple very serious situations that she has tried to help handle.  The two in need of help keep running off any help she hires to help them because the sister leaves the home and they are unable to get in.  The sister has been told she is not  supposed to be driving.   PT educated on how stress can contribute to muscle tension and pain creating elevated symptoms of an existing diagnosis. Discussed some options and encouraged patient to discuss with primary MD.    TREATMENT DATE: 05/10/23 Nu-Step L3 8 min Osteoporosis education-handout given see patient instructions Impact ex's for UE (T score for left radius = osteoporosis): clapping, thigh patting  Impact ex for LE (T score for femur= osteopenia): marching, heel drops Sit to stand from mat + cushion 5x (pt able to rise without UE assist at this height) (added to HEP) Seated 2# wrist extension 10x (added to HEP) Seated 2# wrist pronation supination 10x (added to HEP) Farmer's carry both 2# weights 40 feet x2 Pt has 2# weights at home    PATIENT EDUCATION:  Education details: See above Person educated: Patient Education method: Explanation Education comprehension: verbalized understanding  HOME EXERCISE PROGRAM: Access Code: [email or web address] URL: [email or web address] Date: 06/13/2023 Prepared by: Darien Eden  Exercises -  Sit to Stand  - 2 x daily - 7 x weekly - 1 sets - 5 reps - Wrist Extension with Dumbbell  - 1 x daily - 7 x weekly - 1 sets - 10 reps - Seated Pronation Supination with Dumbbell  - 1 x daily - 7 x weekly - 1 sets - 10 reps - Farmer's Carry with Kettlebells  - 1 x daily - 7 x weekly - 1 sets - 10 reps - Supine Cervical Retraction with Towel  - 1 x daily - 7 x weekly - 1 sets - 10 reps - 5 hold - Cervical Retraction with Resistance  - 1 x daily - 7 x weekly - 1 sets - 10 reps - 5 hold  ASSESSMENT:  CLINICAL IMPRESSION: Therapist providing verbal cues to optimize technique with  exercises in order to achieve the greatest benefit and to assess response to exercise.  Her pain level remains low throughout session.  Her grip strength is below norm values for age/gender therefore will include this into treatment as well.       Prior visit:  Avika met with neurosurgeon.  He is concerned over attempting a fusion given her T scores for osteoporosis.  But he did offer a disc procedure that would be a safer option.  He would like for her to start her infusions for her bone density and come back for f/u in 2 months to see if her T score is improved.  We tried traction today with consideration for her T score.  She seemed to respond well to this.  We had another long discussion about the stress she is under with caring for her sister and how much this is likely impacting her overall health.  She will continue her HEP.  She is intermittently compliant with her HEP.  She would benefit from continued skilled PT to address severe neck pain and guarding.    OBJECTIVE IMPAIRMENTS: Abnormal gait, cardiopulmonary status limiting activity, decreased balance, decreased endurance, difficulty walking, decreased ROM, decreased strength, hypomobility, increased fascial restrictions, increased muscle spasms, impaired flexibility, postural dysfunction, and pain.   ACTIVITY LIMITATIONS: carrying, lifting, bending, sitting,  standing, squatting, sleeping, stairs, transfers, bed mobility, continence, bathing, toileting, dressing, reach over head, hygiene/grooming, and caring for others  PARTICIPATION LIMITATIONS: meal prep, cleaning, laundry, driving, shopping, and community activity  PERSONAL FACTORS: Behavior pattern, Fitness, Past/current experiences, Time since onset of injury/illness/exacerbation, and 3+ comorbidities: scoliosis, htn, pacemaker, and CHF are also affecting patient's functional outcome.   REHAB  POTENTIAL: Fair due to recent episode and still opting to decline surgery  CLINICAL DECISION MAKING: Unstable/unpredictable  EVALUATION COMPLEXITY: High   GOALS: Goals reviewed with patient? Yes  SHORT TERM GOALS: Target date: 06/07/2023  Pain report to be no greater than 4/10  Baseline:  Goal status: In progress  2.  Patient will be independent with initial HEP  Baseline:  Goal status: In progress   LONG TERM GOALS: Target date: 07/05/2023  Patient to report pain no greater than 2/10  Baseline:  Goal status: INITIAL  2.  Patient to be independent with advanced HEP  Baseline:  Goal status: INITIAL  3.  Patient to demonstrate consistency with some level of exercise Baseline:  Goal status: INITIAL  4.  C spine ROM to improve by 2-3 degrees each direction Baseline:  Goal status: INITIAL  5.  Patient to be able to stand or walk for at least 15 min without leg pain  Baseline:  Goal status: INITIAL  6.  Patient to have consult with orthopedic provider Baseline:  Goal status: met    PLAN:  PT FREQUENCY: 1-2x/week  PT DURATION: 8 weeks  PLANNED INTERVENTIONS: 97110-Therapeutic exercises, 97530- Therapeutic activity, 97112- Neuromuscular re-education, [phone]- Self Care, 60454- Manual therapy, [phone]- Gait training, [phone]- Canalith repositioning, [id number]- Aquatic Therapy, [phone]- Electrical stimulation (unattended), [phone]- Electrical stimulation (manual), 97016- Vasopneumatic device,  97035- Ultrasound, [id number]- Ionotophoresis 4mg /ml Dexamethasone , Patient/Family education, Balance training, Stair training, Taping, Dry Needling, Joint mobilization, Spinal mobilization, Vestibular training, Visual/preceptual remediation/compensation, Cryotherapy, and Moist heat  PLAN FOR NEXT SESSION:  cervical retraction/head presses in supine and sitting; Nustep, cervical traction: limited pull weight due to osteoporosis, left shoulder ROM, MELT; grip strengthening  Darien Eden, PT 06/20/23 5:43 PM Phone: [phone] Fax: [phone]   [facility] [address] [place], [place] [address] Phone # [phone] Fax [phone]

## 2023-06-20 NOTE — Telephone Encounter (Signed)
 I contacted the patient to follow up regarding her discontinuation of anastrozole . Per Dr. Enedina Harrow guidance, it is appropriate to stop anastrozole , as it is unlikely to improve bone density, and she will also need to discontinue Zometa . The patient verbally confirmed her understanding and had no additional questions at this time.

## 2023-06-21 ENCOUNTER — Ambulatory Visit (HOSPITAL_BASED_OUTPATIENT_CLINIC_OR_DEPARTMENT_OTHER): Admitting: Family Medicine

## 2023-06-21 ENCOUNTER — Encounter (HOSPITAL_BASED_OUTPATIENT_CLINIC_OR_DEPARTMENT_OTHER): Payer: Self-pay | Admitting: Family Medicine

## 2023-06-21 VITALS — BP 99/55 | HR 54 | Ht 67.0 in | Wt 162.4 lb

## 2023-06-21 DIAGNOSIS — F32A Depression, unspecified: Secondary | ICD-10-CM | POA: Diagnosis not present

## 2023-06-21 DIAGNOSIS — N3001 Acute cystitis with hematuria: Secondary | ICD-10-CM | POA: Diagnosis not present

## 2023-06-21 DIAGNOSIS — F419 Anxiety disorder, unspecified: Secondary | ICD-10-CM

## 2023-06-21 LAB — POCT URINALYSIS DIP (CLINITEK)
Bilirubin, UA: NEGATIVE
Glucose, UA: NEGATIVE mg/dL
Ketones, POC UA: NEGATIVE mg/dL
Leukocytes, UA: NEGATIVE
Nitrite, UA: NEGATIVE
POC PROTEIN,UA: NEGATIVE
Spec Grav, UA: 1.025 (ref 1.010–1.025)
Urobilinogen, UA: 0.2 U/dL
pH, UA: 5.5 (ref 5.0–8.0)

## 2023-06-21 MED ORDER — SULFAMETHOXAZOLE-TRIMETHOPRIM 800-160 MG PO TABS: 1.0000 | ORAL_TABLET | Freq: Two times a day (BID) | ORAL | 0 refills | Status: AC

## 2023-06-21 MED ORDER — BUPROPION HCL ER (XL) 300 MG PO TB24: 300.0000 mg | ORAL_TABLET | Freq: Every day | ORAL | 3 refills | Status: DC

## 2023-06-21 NOTE — Progress Notes (Signed)
 Acute Care Office Visit  Subjective:   DELTA PICHON 08-Oct-1955 06/21/2023  Chief Complaint  Patient presents with   Urinary Tract Infection    Patient states she has a constant urge to go to the bathroom and states she has had some burning. States she did have an accident a couple weeks ago which she believes is what brought the symptoms on.    HPI: URINARY SYMPTOMS Onset: 2 weeks . Patient reports having an episode of incontinence that occurred on her way home approx. 2 weeks ago which she believes may have caused infection.    Fever/chills: no Dysuria: burning Urinary frequency: yes Urgency: yes Foul odor: Yes, intermittent  Urinary incontinence: no Hematuria: no Abdominal pain: no Suprapubic pain/pressure: no Flank/low back pain: no Nausea/Vomiting: no  Treatments tried: Macrobid - she had left over from previous prescription and took a 'few with improvement but had return of symptoms   Previous urinary tract infection: yes; April 10 2023 Recurrent urinary tract infection: no History of sexually transmitted disease: no   ANXIETY AND DEPRESSION: Talaysia F Crace presents for the medical management of anxiety and depression. She states she has been under significant stress and would like to increase her Wellbutrin  if possible. Denies panic like symptoms.  Current medication regimen: Wellbutrin  150mg  XR daily, Alprazolam  0.5mg  TID PRN (filled by Psychiatry April 2025 per PDMP)  Counseling: Recommended Denies SI/HI.     06/21/2023   10:29 AM 04/10/2023    1:44 PM 01/09/2023    2:42 PM 12/05/2022    1:47 PM  GAD 7 : Generalized Anxiety Score  Nervous, Anxious, on Edge 1 1 1 3   Control/stop worrying 1 3 0 2  Worry too much - different things 1 3 0 2  Trouble relaxing 0 0 0 1  Restless 0 0 0 0  Easily annoyed or irritable 1 2 0 1  Afraid - awful might happen 1 2 0 0  Total GAD 7 Score 5 11 1 9   Anxiety Difficulty Somewhat difficult Somewhat difficult  Not difficult at all Very difficult      06/21/2023   10:28 AM 04/10/2023    1:43 PM 01/09/2023    2:18 PM 12/05/2022    1:47 PM 09/20/2022    1:51 PM  Depression screen PHQ 2/9  Decreased Interest 1 3 1 2  0  Down, Depressed, Hopeless 1 3 0 2 0  PHQ - 2 Score 2 6 1 4  0  Altered sleeping 2 3 2 3    Tired, decreased energy 2 3 2 3    Change in appetite 2 2 2 3    Feeling bad or failure about yourself  1 1 0 2   Trouble concentrating 0 0 0 0   Moving slowly or fidgety/restless 0 1 2 0   Suicidal thoughts 0 0 0 0   PHQ-9 Score 9 16 9 15    Difficult doing work/chores Somewhat difficult Very difficult Not difficult at all Very difficult        The following portions of the patient's history were reviewed and updated as appropriate: past medical history, past surgical history, family history, social history, allergies, medications, and problem list.   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   Rosacea 11/05/2019   Scar 11/05/2019   Biventricular cardiac pacemaker in situ 10/08/2018   Long term current use of anticoagulant therapy 10/23/2017   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   Trigeminal neuralgia 12/14/2012   Insomnia, persistent 11/03/2011   AICD (automatic cardioverter/defibrillator) present    Aortic regurgitation    Nonischemic cardiomyopathy (HCC) 01/21/2011   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  11/17/2010   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   Esophagitis, reflux 08/24/2010   Idiopathic peripheral neuropathy 08/24/2010   Chronic systolic CHF (congestive heart failure) (HCC) 02/24/2010   Paroxysmal atrial fibrillation (HCC) 02/23/2010   Hypothyroidism    History of migraine headaches    Left bundle branch block    Idiopathic scoliosis and kyphoscoliosis     Class: Chronic   Lumbar canal stenosis 12/25/2009   Menopause 04/15/2009   Anxiety state 07/24/2008   Avitaminosis D 03/21/2008   Atypical migraine 01/30/2008   Benign essential HTN 01/30/2008   Chronic migraine w/o aura w/o status migrainosus, not intractable 01/30/2008   Paroxysmal digital cyanosis 02/15/2007   Raynaud's phenomenon 02/15/2007   Past Medical History:  Diagnosis Date   AF (paroxysmal atrial fibrillation) (HCC)    Arthritis    BCC (basal cell carcinoma) 03/11/2003   left distal lower leg ankle-tx dr Alanda Allegra   Biventricular cardiac pacemaker in situ    a. prior CRT-D in 2013 with downgrade to CRT-Pacemaker only in 11/2016.   Breast cancer (HCC) 12/12/2019   Chronic systolic CHF (congestive heart failure) (HCC)    Complication of anesthesia    aspiration   Essential tremor    GERD (gastroesophageal reflux disease)    otc   Hypertension    Hypothyroidism    LBBB (left bundle branch block)    conduction disorder   Migraines    Mild aortic insufficiency    NICM (nonischemic cardiomyopathy) (HCC)    a. 2012: normal coronaries, EF 25-30%. b. improved to 55% 11/2016.   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   in situ- right lower leg-anterior ([id number])   Scoliosis    Past Surgical History:  Procedure Laterality Date   ARTERY BIOPSY  12/29/2011   Procedure: MINOR BIOPSY  TEMPORAL ARTERY;  Surgeon: Quitman Bucy, MD;  Location: [facility];  Service: General;  Laterality: Right;  Right temporal artery biopsy   BACK SURGERY  12/31/2009   for flat back syndrome; fused bottom of my back   BI-VENTRICULAR IMPLANTABLE CARDIOVERTER DEFIBRILLATOR Left 01/20/2011   Procedure: BI-VENTRICULAR IMPLANTABLE CARDIOVERTER DEFIBRILLATOR  (CRT-D);  Surgeon: Tammie Fall, MD;  Location[facility] CATH LAB;  Service: Cardiovascular;  Laterality: Left;   BIV ICD GENERATOR CHANGEOUT N/A 11/24/2016   Procedure: BIV ICD GENERATOR CHANGEOUT;  Surgeon: Tammie Fall, MD;  Location: [facility] INVASIVE CV LAB;  Service: Cardiovascular;  Laterality: N/A;   BREAST LUMPECTOMY WITH RADIOACTIVE SEED LOCALIZATION Left 01/22/2020   Procedure: LEFT BREAST LUMPECTOMY WITH RADIOACTIVE SEED LOCALIZATION;  Surgeon: Dareen Ebbing, MD;  Location: [facility];  Service: General;  Laterality: Left;  LMA   CERVICAL ABLATION  03/29/2023   CHOLECYSTECTOMY  ~1996   FOOT NEUROMA SURGERY  ~ 2003   right   RE-EXCISION OF BREAST LUMPECTOMY Left 03/03/2020   Procedure: RE-EXCISION MARGINS OF LEFT BREAST LUMPECTOMY;  Surgeon: Dareen Ebbing, MD;  Location: [facility];  Service: General;  Laterality: Left;   ROTATOR CUFF REPAIR  ~ 2009   left   SHOULDER ARTHROSCOPY W/ ROTATOR CUFF REPAIR Right 09/21/2011   SKIN CANCER EXCISION     nose, forehead, left leg   TEE WITHOUT CARDIOVERSION N/A 03/01/2017   Procedure: TRANSESOPHAGEAL ECHOCARDIOGRAM (TEE);  Surgeon: Maudine Sos, MD;  Location: [facility] ENDOSCOPY;  Service: Cardiovascular;  Laterality: N/A;   THYROID  LOBECTOMY  ~ 2006   right   TUBAL LIGATION  ~ 1983   VAGINAL HYSTERECTOMY  ~ 2009   VESICOVAGINAL FISTULA CLOSURE W/ TAH     Family History  Problem Relation Age of Onset   Heart disease Mother    Heart failure Mother 38       CHF   Stroke Mother    Hypertension Mother    Heart disease Father 43   Macular  degeneration Father    Heart attack Sister    Heart disease Sister    Atrial fibrillation Sister    Heart disease Sister    Outpatient Medications Prior to Visit  Medication Sig Dispense Refill   acetaminophen  (TYLENOL ) 325 MG tablet Take 650 mg by mouth as needed.     albuterol  (VENTOLIN  HFA) 108 (90 Base) MCG/ACT inhaler Inhale 2 puffs into the lungs every 6 (six) hours as needed for wheezing or shortness of breath. 8 g 2   ALPRAZolam  (XANAX ) 0.5 MG tablet Take 0.5 mg by mouth 3 (three) times daily as needed for anxiety.     apixaban  (ELIQUIS ) 5 MG TABS tablet Take 1 tablet (5 mg total) by mouth 2 (two) times daily. 180 tablet 1   BOTOX 200 units injection Once every 4 months     CALCIUM PO Take 2 tablets by mouth daily.     cetirizine (ZYRTEC) 10 MG tablet Take 10 mg by mouth daily.     Cyanocobalamin  1000 MCG/ML KIT Inject 1,000 mcg as directed every 30 (thirty) days.     eletriptan  (RELPAX ) 40 MG tablet Take 1 tablet (40 mg total) by mouth every 2 (two) hours as needed for migraine or headache. May repeat in 2 hours if headache persists or recurs. 10 tablet 2   famotidine  (PEPCID ) 20 MG tablet Take 20 mg by mouth daily as needed for heartburn or indigestion.     gabapentin  (NEURONTIN ) 600 MG tablet Take 600 mg by mouth 2 (two) times daily.     levothyroxine  (SYNTHROID ) 100 MCG tablet Take 1 tablet (100 mcg total) by mouth daily before breakfast. 90 tablet 0   Melatonin 5 MG CAPS Take 10 mg by mouth at bedtime.     Menthol  (BIOFREEZE) 10 % AERO      nystatin -triamcinolone  ointment (MYCOLOG) Apply 1 application to external vaginal surface twice daily for 7 days. 30 g 2   Omega-3 Fatty Acids (FISH OIL) 1000 MG CAPS 1 capsule     omeprazole  (PRILOSEC) 40 MG capsule Take 1 capsule (40 mg total) by mouth daily. 90 capsule 3   polyethylene glycol (MIRALAX  / GLYCOLAX ) packet Take 17 g by mouth every evening.     propranolol  (INDERAL ) 60 MG tablet Take 1  tablet (60 mg total) by mouth daily. 90  tablet 3   spironolactone  (ALDACTONE ) 25 MG tablet Take 0.5 tablets (12.5 mg total) by mouth daily. 45 tablet 3   tretinoin  (RETIN-A ) 0.025 % cream Apply topically at bedtime. Apply to face on Monday and Thursday NIGHTS 45 g 4   Varenicline  Tartrate (TYRVAYA ) 0.03 MG/ACT SOLN Place into the nose.     buPROPion  (WELLBUTRIN  XL) 150 MG 24 hr tablet Take 1 tablet (150 mg total) by mouth daily. 90 tablet 3   anastrozole  (ARIMIDEX ) 1 MG tablet Take 1 tablet (1 mg total) by mouth daily. 90 tablet 0   Azelaic Acid 15 % gel Apply 1 Application topically 2 (two) times daily.     Facility-Administered Medications Prior to Visit  Medication Dose Route Frequency Provider Last Rate Last Admin   [START ON 07/16/2023] Romosozumab -aqqg (EVENITY ) 105 MG/1. injection 210 mg  210 mg Subcutaneous Q30 days Persons, Norma Beckers, [place]       Allergies  Allergen Reactions   Other Nausea And Vomiting and Other (See Comments)    Most narcotic pain meds cause N/V and CHEST PAIN; needs an antiemetic to tolerate  Also, patient was diagnosed with Raynaud's disease and was told to NOT place IV(s) in either hand because of poor circulation   Iodine Other (See Comments)    Blisters   Tape Other (See Comments)    PATIENT'S SKIN IS VERY THIN; IT TEARS, BRUISES, AND BLEEDS VERY EASILY (PLEASE USE COBAN WRAP, IF POSSIBLE!!)   Codeine Other (See Comments)    Chest pain   Fentanyl  Other (See Comments)    Bad dreams   Morphine  And Codeine Nausea And Vomiting     ROS: A complete ROS was performed with pertinent positives/negatives noted in the HPI. The remainder of the ROS are negative.    Objective:   Today's Vitals   06/21/23 1021  BP: (!) 99/55  Pulse: (!) 54  SpO2: 99%  Weight: 162 lb 6.4 oz (73.7 kg)  Height: 5' 7 (1.702 m)    GENERAL: Well-appearing, in NAD. Well nourished.  SKIN: Pink, warm and dry. Head: Normocephalic. NECK: Trachea midline. Full ROM w/o pain or tenderness.  RESPIRATORY: Chest wall  symmetrical. Respirations even and non-labored. Breath sounds clear to auscultation bilaterally.  CARDIAC: S1, S2 present, regular rate and rhythm without murmur or gallops. Peripheral pulses 2+ bilaterally.  MSK: Muscle tone and strength appropriate for age.  GI: Abdomen soft, non-tender. No rebound tenderness. No CVA tenderness.  NEUROLOGIC: No motor or sensory deficits. Steady, even gait. C2-C12 intact.  PSYCH/MENTAL STATUS: Alert, oriented x 3. Cooperative, appropriate mood and affect.    Results for orders placed or performed in visit on 06/21/23  POCT URINALYSIS DIP (CLINITEK)  Result Value Ref Range   Color, UA yellow yellow   Clarity, UA clear clear   Glucose, UA negative negative mg/dL   Bilirubin, UA negative negative   Ketones, POC UA negative negative mg/dL   Spec Grav, UA 1.610 9.604 - 1.025   Blood, UA small (A) negative   pH, UA 5.5 5.0 - 8.0   POC PROTEIN,UA negative negative, trace   Urobilinogen, UA 0.2 0.2 or 1.0 E.U./dL   Nitrite, UA Negative Negative   Leukocytes, UA Negative Negative      Assessment & Plan:  1. Acute cystitis with hematuria (Primary) Hematuria present. Will send for culture given recent UTI in within 3 months. Start Bactrim  BID x 3 days.  - sulfamethoxazole-trimethoprim (BACTRIM  DS)  800-160 MG tablet; Take 1 tablet by mouth 2 (two) times daily for 3 days.  Increase clear fluids. Recommend we check her urine for resolution of hematuria at her follow up on 07/10/23. Dispense: 6 tablet; Refill: 0 - Urine Culture - POCT URINALYSIS DIP (CLINITEK)  2. Anxiety and depression Currently uncontrolled. Will increase her wellbutrin  to 300 mg XR daily. Safe use of medications reviewed with patient and she verbalized understanding. Recommend counseling. Safety plan reviewed. Will follow up in 3 weeks at scheduled visit.  - buPROPion  (WELLBUTRIN  XL) 300 MG 24 hr tablet; Take 1 tablet (300 mg total) by mouth daily.  Dispense: 60 tablet; Refill: 3   Meds  ordered this encounter  Medications   sulfamethoxazole-trimethoprim (BACTRIM  DS) 800-160 MG tablet    Sig: Take 1 tablet by mouth 2 (two) times daily for 3 days.    Dispense:  6 tablet    Refill:  0    Supervising Provider:   DE Peru, RAYMOND J [[id number]]   buPROPion  (WELLBUTRIN  XL) 300 MG 24 hr tablet    Sig: Take 1 tablet (300 mg total) by mouth daily.    Dispense:  60 tablet    Refill:  3    Supervising Provider:   DE [place], RAYMOND J [[id number]]   Lab Orders         Urine Culture         POCT URINALYSIS DIP (CLINITEK)     Return if symptoms worsen or fail to improve, for Scheduled 07/10/23.    Patient to reach out to office if new, worrisome, or unresolved symptoms arise or if no improvement in patient's condition. Patient verbalized understanding and is agreeable to treatment plan. All questions answered to patient's satisfaction.    Nonda Bays, [place]

## 2023-06-22 ENCOUNTER — Ambulatory Visit: Admitting: Physical Therapy

## 2023-06-22 DIAGNOSIS — R252 Cramp and spasm: Secondary | ICD-10-CM

## 2023-06-22 DIAGNOSIS — R262 Difficulty in walking, not elsewhere classified: Secondary | ICD-10-CM | POA: Diagnosis not present

## 2023-06-22 DIAGNOSIS — M25612 Stiffness of left shoulder, not elsewhere classified: Secondary | ICD-10-CM | POA: Diagnosis not present

## 2023-06-22 DIAGNOSIS — M25512 Pain in left shoulder: Secondary | ICD-10-CM | POA: Diagnosis not present

## 2023-06-22 DIAGNOSIS — R293 Abnormal posture: Secondary | ICD-10-CM | POA: Diagnosis not present

## 2023-06-22 DIAGNOSIS — M6281 Muscle weakness (generalized): Secondary | ICD-10-CM

## 2023-06-22 DIAGNOSIS — G8929 Other chronic pain: Secondary | ICD-10-CM | POA: Diagnosis not present

## 2023-06-22 NOTE — Therapy (Signed)
 OUTPATIENT PHYSICAL THERAPY CERVICAL PROGRESS   Patient Name: [name] MRN: [id number] DOB:07-Oct-1955, 68 y.o., female Today's Date: 06/22/2023  END OF SESSION:  PT End of Session - 06/22/23 1405     Visit Number 8    Number of Visits 16    Date for PT Re-Evaluation 07/05/23    Authorization Type [employer] 16 visits  4/30-6/25    Authorization - Visit Number 8    Authorization - Number of Visits 16    Progress Note Due on Visit 10    PT Start Time 1403    PT Stop Time 1443    PT Time Calculation (min) 40 min    Activity Tolerance Patient tolerated treatment well          Past Medical History:  Diagnosis Date   AF (paroxysmal atrial fibrillation) (HCC)    Arthritis    BCC (basal cell carcinoma) 03/11/2003   left distal lower leg ankle-tx dr Alanda Allegra   Biventricular cardiac pacemaker in situ    a. prior CRT-D in 2013 with downgrade to CRT-Pacemaker only in 11/2016.   Breast cancer (HCC) 12/12/2019   Chronic systolic CHF (congestive heart failure) (HCC)    Complication of anesthesia    aspiration   Essential tremor    GERD (gastroesophageal reflux disease)    otc   Hypertension    Hypothyroidism    LBBB (left bundle branch block)    conduction disorder   Migraines    Mild aortic insufficiency    NICM (nonischemic cardiomyopathy) (HCC)    a. 2012: normal coronaries, EF 25-30%. b. improved to 55% 11/2016.   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   in situ- right lower leg-anterior ([id number])   Scoliosis    Past Surgical History:  Procedure Laterality Date   ARTERY BIOPSY  12/29/2011   Procedure: MINOR BIOPSY TEMPORAL ARTERY;  Surgeon: Quitman Bucy, MD;  Location: [facility];  Service: General;  Laterality: Right;  Right  temporal artery biopsy   BACK SURGERY  12/31/2009   for flat back syndrome; fused bottom of my back   BI-VENTRICULAR IMPLANTABLE CARDIOVERTER DEFIBRILLATOR Left 01/20/2011   Procedure: BI-VENTRICULAR IMPLANTABLE CARDIOVERTER DEFIBRILLATOR  (CRT-D);  Surgeon: Tammie Fall, MD;  Location: [facility] CATH LAB;  Service: Cardiovascular;  Laterality: Left;   BIV ICD GENERATOR CHANGEOUT N/A 11/24/2016   Procedure: BIV ICD GENERATOR CHANGEOUT;  Surgeon: Tammie Fall, MD;  Location: [facility] INVASIVE CV LAB;  Service: Cardiovascular;  Laterality: N/A;   BREAST LUMPECTOMY WITH RADIOACTIVE SEED LOCALIZATION Left 01/22/2020   Procedure: LEFT BREAST LUMPECTOMY WITH RADIOACTIVE SEED LOCALIZATION;  Surgeon: Dareen Ebbing, MD;  Location: [facility];  Service: General;  Laterality: Left;  LMA   CERVICAL ABLATION  03/29/2023   CHOLECYSTECTOMY  ~1996   FOOT NEUROMA SURGERY  ~ 2003   right   RE-EXCISION OF BREAST LUMPECTOMY Left 03/03/2020   Procedure: RE-EXCISION MARGINS OF LEFT BREAST LUMPECTOMY;  Surgeon: Dareen Ebbing, MD;  Location: [facility];  Service: General;  Laterality: Left;   ROTATOR CUFF REPAIR  ~ 2009   left   SHOULDER ARTHROSCOPY W/ ROTATOR CUFF REPAIR Right 09/21/2011   SKIN CANCER EXCISION     nose, forehead, left leg  TEE WITHOUT CARDIOVERSION N/A 03/01/2017   Procedure: TRANSESOPHAGEAL ECHOCARDIOGRAM (TEE);  Surgeon: Maudine Sos, MD;  Location: [facility] ENDOSCOPY;  Service: Cardiovascular;  Laterality: N/A;   THYROID  LOBECTOMY  ~ 2006   right   TUBAL LIGATION  ~ 1983   VAGINAL HYSTERECTOMY  ~ 2009   VESICOVAGINAL FISTULA CLOSURE W/ TAH     Patient Active Problem List   Diagnosis Date Noted   Encounter for procedure for purposes other than remedying health state, unspecified 05/09/2023   Cellulitis of right lower extremity 02/03/2023   Delayed healing of traumatic wound 02/03/2023   Biventricular ICD (implantable cardioverter-defibrillator) in place    DOE  (dyspnea on exertion) 04/07/2021   Age-related osteoporosis without current pathological fracture 11/29/2020   Atrophy of vagina 11/29/2020   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   Breast CA (HCC) 02/18/2020   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   Rosacea 11/05/2019   Scar 11/05/2019   Biventricular cardiac pacemaker in situ 10/08/2018   Long term current use of anticoagulant therapy 10/23/2017   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   Trigeminal neuralgia 12/14/2012   Insomnia, persistent 11/03/2011   AICD (automatic cardioverter/defibrillator) present    Aortic regurgitation    Nonischemic cardiomyopathy (HCC) 01/21/2011   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/17/2010   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   Esophagitis, reflux 08/24/2010   Idiopathic peripheral neuropathy 08/24/2010   Chronic systolic CHF (congestive heart failure) (HCC) 02/24/2010   Paroxysmal atrial fibrillation (HCC) 02/23/2010   Hypothyroidism    History of migraine headaches    Left bundle branch block    Idiopathic scoliosis and kyphoscoliosis     Class: Chronic   Lumbar canal stenosis 12/25/2009   Menopause 04/15/2009   Anxiety state 07/24/2008   Avitaminosis D 03/21/2008   Atypical migraine 01/30/2008   Benign essential HTN 01/30/2008   Chronic migraine w/o aura w/o status migrainosus, not intractable 01/30/2008   Paroxysmal digital cyanosis 02/15/2007   Raynaud's phenomenon 02/15/2007    PCP: Persons, Norma Beckers, [place]  REFERRING PROVIDER: Persons, Norma Beckers, [place]  REFERRING DIAG: M81.0 (ICD-10-CM) - Age-related osteoporosis without current pathological fracture  THERAPY DIAG:  Cramp and spasm  Muscle weakness (generalized)  Abnormal posture  Rationale for Evaluation and Treatment: Rehabilitation  ONSET DATE: 04/24/2023  SUBJECTIVE:  SUBJECTIVE STATEMENT: I have a UTI and am on Bactrim .  I slept well last night though.  Some soreness in the muscles in the back of the neck after last session.  Last visit:  Patient reports she saw Dr. Rochelle Chu.  He did more xrays of her neck and discussed options.  He is concerned over attempting a fusion given her T scores for osteoporosis.  But he did offer a disc procedure that would be a safer option.  He would like for her to start her infusions for her bone density and come back for f/u in 2 months to see if her T score is improved.    Eval:  This is patient's second episode recently.  She reports she continues to have neck pain and left shoulder pain and has been diagnosed with osteoporosis.  She has not done her exercises from last episode of PT.  She was afraid that they were hurting her.  She expresses concern over having any invasive treatment due to a bad experience in her past.  She has pacemaker as well.  She hopes to gain strength and reduce her overall pain.   Hand dominance: Right  PERTINENT HISTORY:  Long hx of neck and left shoulder pain, has been advised to have left shoulder replacement  PAIN:  6/12: Are you having pain? Yes: NPRS scale: 2/10  Pain location: neck and shoulder, left foot and leg   Pain description: aching, tight Aggravating factors: driving, reaching overhead Relieving factors: Not using the left arm  PRECAUTIONS: None  RED FLAGS: None     WEIGHT BEARING RESTRICTIONS: No  FALLS:  Has patient fallen in last 6 months? Yes. Number of falls 1  LIVING ENVIRONMENT: Lives with: lives with their spouse Lives in: House/apartment   OCCUPATION: retired  PLOF: Independent, Independent with basic ADLs, Independent with household mobility without device, Independent with community mobility without device, Independent with homemaking with ambulation,  Independent with gait, and Independent with transfers  PATIENT GOALS: to eliminate pain  NEXT MD VISIT: prn  OBJECTIVE:  Note: Objective measures were completed at Evaluation unless otherwise noted.  DIAGNOSTIC FINDINGS:  IMPRESSION: 1. Fused thoracic scoliosis with compensatory cervical curvature and multilevel degeneration. 2. Moderate foraminal impingement bilaterally at C5-6 and C6-7, similar to 08/30/2019 cervical MRI. The spinal canal appears widely patent.  COGNITION: Overall cognitive status: Within functional limits for tasks assessed  SENSATION: WFL  POSTURE: rounded shoulders, forward head, and thoracic fusion and scoliosis  PALPATION: Severe fascial restriction and trigger points bilateral upper traps   CERVICAL ROM:   Active ROM A/PROM (deg) eval 6/12  Flexion 30 45  Extension 20 10  Right lateral flexion 12 20  Left lateral flexion 8 12  Right rotation 30 33  Left rotation 20 23   (Blank rows = not tested)  UPPER EXTREMITY ROM:  Left UE significantly restricted due to end stage OA.  Provider has suggested shoulder replacement.   UPPER EXTREMITY MMT:  MMT Right eval Left eval  Shoulder flexion    Shoulder extension    Shoulder abduction    Shoulder adduction    Shoulder extension    Shoulder internal rotation    Shoulder external rotation    Middle trapezius    Lower trapezius    Elbow flexion    Elbow extension    Wrist flexion    Wrist extension    Wrist ulnar deviation    Wrist radial deviation    Wrist pronation  Wrist supination    Grip strength     (Blank rows = not tested)   FUNCTIONAL TESTS:  5 times sit to stand: 10.51 sec Timed up and go (TUG): 9.49 sec   TREATMENT DATE: 06/22/23 Nustep x 10 min level 5 while discussing status Supine blue loop (light) steering wheels 10x Supine blue loop (light) clocks 3 ways 5x each side Supine cervical retraction against pillow + foam 10x Sit to stand mat table + foam 5x  (cues  for patellofemoral alignment) Grip strength Digiflex 5# green individual fingers and gross grip 10x each right/left  Flexbar twist 5x right/left  Cervical ROM Sit to stand chair + foam holding 4# plyoball  Seated cervical head press into yellow band 10x   TREATMENT DATE: 06/20/23 Nustep x 10 min level 5 while discussing status Supine cane press 2# steering wheels 10x Supine ball squeeze fist to palm 5 sec hold 10x Supine ball squeeze with bil elevation 10x Supine steering wheels with cane+ 2# 10x Supine cervical retraction against therapist hand behind the head for tactile cue, pillow + foam 10x Supine shoulder external rotation yellow band 10x Sit to stand mat table + foam 5x  (cues for patellofemoral alignment) Grip strength 35# right/left Seated cervical head press into yellow band 10x  Seated head push into purple ball on wall 10x 5 sec hold  TREATMENT DATE: 06/13/23 Nustep x 6 min level 5 while discussing status Supine cane press 2# 5x 5 sec hold Supine cane overhead 2# 5x Supine ball squeeze between palms 5 sec hold 15x Supine shoulder external rotation yellow band 10x Supine single arm extension with other arm fixed at 90 degrees isometric hold 10x right/left Supine yellow band around wrists steering wheel turns  at 90 degrees shoulder flexion 10x Supine cervical retraction against therapist hand behind the head for tactile cue, pillow + foam 10x (Added to HEP- see below) Seated cervical head press into yellow band 10x 2 (Added to HEP- see below) given yellow band for home use Seated small ball on thighs: UE push down on ball for upper quarter stabilization 10x 5 sec holds  TREATMENT DATE: 06/08/23 Nustep x 5 min level 3 Lengthy discussion about her visit with Dr. Rochelle Chu: options, controlling stress level Cervical traction at limited pull due to osteoporosis 5 lb min, 10 lb max, 15 sec hold, 5 sec rest, 2 steps x 15 min  TREATMENT DATE: 06/06/23 Nustep x 5 min level 3 Supine  AAROM left shoulder all planes of motion MELT technique for C spine: rotations x 20, flex/ext x 20, figure 8's x 20 Lengthy discussion about managing pain and stress.  To see Dr. Rochelle Chu tomorrow for neuro consult  PATIENT EDUCATION:  Education details: See above Person educated: Patient Education method: Explanation Education comprehension: verbalized understanding  HOME EXERCISE PROGRAM: Access Code: [email or web address] URL: [email or web address] Date: 06/13/2023 Prepared by: Darien Eden  Exercises - Sit to Stand  - 2 x daily - 7 x weekly - 1 sets - 5 reps - Wrist Extension with Dumbbell  - 1 x daily - 7 x weekly - 1 sets - 10 reps - Seated Pronation Supination with Dumbbell  - 1 x daily - 7 x weekly - 1 sets - 10 reps - Farmer's Carry with Kettlebells  - 1 x daily - 7 x weekly - 1 sets - 10 reps - Supine Cervical Retraction with Towel  - 1 x daily - 7 x weekly - 1 sets - 10 reps - 5 hold -  Cervical Retraction with Resistance  - 1 x daily - 7 x weekly - 1 sets - 10 reps - 5 hold  ASSESSMENT:  CLINICAL IMPRESSION: Cervical ROM improved in most planes of movement (all except for extension). Good response to cervical and upper quarter strengthening targeting cervical extensor muscles needed to hold her head erect.  Emphasis also on light resistance ex's to help with building bone density.  Therapist closely monitoring response throughout session.  Verbal cues for exercise technique.  Her pain level remains low during and following treatment.       Prior visit:  Juwana met with neurosurgeon.  He is concerned over attempting a fusion given her T scores for osteoporosis.  But he did offer a disc procedure that would be a safer option.  He would like for her to start her infusions for her bone density and come back for f/u in 2 months to see if her T score is improved.  We tried traction today with consideration for her T score.  She seemed to respond well to this.  We had another long  discussion about the stress she is under with caring for her sister and how much this is likely impacting her overall health.  She will continue her HEP.  She is intermittently compliant with her HEP.  She would benefit from continued skilled PT to address severe neck pain and guarding.    OBJECTIVE IMPAIRMENTS: Abnormal gait, cardiopulmonary status limiting activity, decreased balance, decreased endurance, difficulty walking, decreased ROM, decreased strength, hypomobility, increased fascial restrictions, increased muscle spasms, impaired flexibility, postural dysfunction, and pain.   ACTIVITY LIMITATIONS: carrying, lifting, bending, sitting, standing, squatting, sleeping, stairs, transfers, bed mobility, continence, bathing, toileting, dressing, reach over head, hygiene/grooming, and caring for others  PARTICIPATION LIMITATIONS: meal prep, cleaning, laundry, driving, shopping, and community activity  PERSONAL FACTORS: Behavior pattern, Fitness, Past/current experiences, Time since onset of injury/illness/exacerbation, and 3+ comorbidities: scoliosis, htn, pacemaker, and CHF are also affecting patient's functional outcome.   REHAB POTENTIAL: Fair due to recent episode and still opting to decline surgery  CLINICAL DECISION MAKING: Unstable/unpredictable  EVALUATION COMPLEXITY: High   GOALS: Goals reviewed with patient? Yes  SHORT TERM GOALS: Target date: 06/07/2023  Pain report to be no greater than 4/10  Baseline:  Goal status: met 6/12  2.  Patient will be independent with initial HEP  Baseline:  Goal status: met 6/12  LONG TERM GOALS: Target date: 07/05/2023  Patient to report pain no greater than 2/10  Baseline:  Goal status: INITIAL  2.  Patient to be independent with advanced HEP  Baseline:  Goal status: INITIAL  3.  Patient to demonstrate consistency with some level of exercise Baseline:  Goal status: INITIAL  4.  C spine ROM to improve by 2-3 degrees each  direction Baseline:  Goal status: INITIAL  5.  Patient to be able to stand or walk for at least 15 min without leg pain  Baseline:  Goal status: INITIAL  6.  Patient to have consult with orthopedic provider Baseline:  Goal status: met    PLAN:  PT FREQUENCY: 1-2x/week  PT DURATION: 8 weeks  PLANNED INTERVENTIONS: 97110-Therapeutic exercises, 97530- Therapeutic activity, 97112- Neuromuscular re-education, 97535- Self Care, 11914- Manual therapy, [id number]- Gait training, [phone]- Canalith repositioning, [id number]- Aquatic Therapy, [phone]- Electrical stimulation (unattended), [id number]- Electrical stimulation (manual), [id number]- Vasopneumatic device, [id number]- Ultrasound, [id number]- Ionotophoresis 4mg /ml Dexamethasone , Patient/Family education, Balance training, Stair training, Taping, Dry Needling, Joint mobilization, Spinal mobilization, Vestibular training, Visual/preceptual remediation/compensation, Cryotherapy,  and Moist heat  PLAN FOR NEXT SESSION:  cervical retraction/head presses in supine and sitting; Nustep, cervical traction: limited pull weight due to osteoporosis, left shoulder ROM, MELT; grip strengthening  Darien Eden, PT 06/22/23 5:43 PM Phone: [phone] Fax: [phone]   [facility] [address] [place], [place] [address] Phone # [phone] Fax [phone]

## 2023-06-23 ENCOUNTER — Ambulatory Visit (HOSPITAL_BASED_OUTPATIENT_CLINIC_OR_DEPARTMENT_OTHER): Payer: Self-pay | Admitting: Family Medicine

## 2023-06-23 NOTE — Progress Notes (Signed)
 Urine culture is unremarkable. Continue current regimen as directed.

## 2023-06-27 ENCOUNTER — Ambulatory Visit

## 2023-06-27 DIAGNOSIS — R252 Cramp and spasm: Secondary | ICD-10-CM

## 2023-06-27 DIAGNOSIS — M6281 Muscle weakness (generalized): Secondary | ICD-10-CM

## 2023-06-27 DIAGNOSIS — R262 Difficulty in walking, not elsewhere classified: Secondary | ICD-10-CM | POA: Diagnosis not present

## 2023-06-27 DIAGNOSIS — M25512 Pain in left shoulder: Secondary | ICD-10-CM | POA: Diagnosis not present

## 2023-06-27 DIAGNOSIS — G8929 Other chronic pain: Secondary | ICD-10-CM

## 2023-06-27 DIAGNOSIS — M25612 Stiffness of left shoulder, not elsewhere classified: Secondary | ICD-10-CM | POA: Diagnosis not present

## 2023-06-27 DIAGNOSIS — R293 Abnormal posture: Secondary | ICD-10-CM | POA: Diagnosis not present

## 2023-06-28 NOTE — Therapy (Signed)
 OUTPATIENT PHYSICAL THERAPY CERVICAL PROGRESS   Patient Name: [name] MRN: [id number] DOB:12-27-1955, 68 y.o., female Today's Date: 06/28/2023  END OF SESSION:  PT End of Session - 06/27/23 2155     Visit Number 9    Number of Visits 16    Date for PT Re-Evaluation 07/05/23    Authorization Type [employer] 16 visits  4/30-6/25    Authorization - Visit Number 9    Authorization - Number of Visits 16    Progress Note Due on Visit 10    PT Start Time 1145    PT Stop Time 1235    PT Time Calculation (min) 50 min    Activity Tolerance Patient tolerated treatment well    Behavior During Therapy Anxious;Flat affect          Past Medical History:  Diagnosis Date   AF (paroxysmal atrial fibrillation) (HCC)    Arthritis    BCC (basal cell carcinoma) 03/11/2003   left distal lower leg ankle-tx dr Alanda Allegra   Biventricular cardiac pacemaker in situ    a. prior CRT-D in 2013 with downgrade to CRT-Pacemaker only in 11/2016.   Breast cancer (HCC) 12/12/2019   Chronic systolic CHF (congestive heart failure) (HCC)    Complication of anesthesia    aspiration   Essential tremor    GERD (gastroesophageal reflux disease)    otc   Hypertension    Hypothyroidism    LBBB (left bundle branch block)    conduction disorder   Migraines    Mild aortic insufficiency    NICM (nonischemic cardiomyopathy) (HCC)    a. 2012: normal coronaries, EF 25-30%. b. improved to 55% 11/2016.   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   in situ- right lower leg-anterior ([id number])   Scoliosis    Past Surgical History:  Procedure Laterality Date   ARTERY BIOPSY  12/29/2011   Procedure: MINOR BIOPSY TEMPORAL ARTERY;  Surgeon: Quitman Bucy, MD;  Location: [facility];  Service: General;  Laterality: Right;  Right temporal artery biopsy   BACK SURGERY  12/31/2009   for flat back syndrome; fused bottom of my back   BI-VENTRICULAR IMPLANTABLE CARDIOVERTER DEFIBRILLATOR Left 01/20/2011   Procedure: BI-VENTRICULAR IMPLANTABLE CARDIOVERTER DEFIBRILLATOR  (CRT-D);  Surgeon: Tammie Fall, MD;  Location: [facility] CATH LAB;  Service: Cardiovascular;  Laterality: Left;   BIV ICD GENERATOR CHANGEOUT N/A 11/24/2016   Procedure: BIV ICD GENERATOR CHANGEOUT;  Surgeon: Tammie Fall, MD;  Location: [facility] INVASIVE CV LAB;  Service: Cardiovascular;  Laterality: N/A;   BREAST LUMPECTOMY WITH RADIOACTIVE SEED LOCALIZATION Left 01/22/2020   Procedure: LEFT BREAST LUMPECTOMY WITH RADIOACTIVE SEED LOCALIZATION;  Surgeon: Dareen Ebbing, MD;  Location: [facility];  Service: General;  Laterality: Left;  LMA   CERVICAL ABLATION  03/29/2023   CHOLECYSTECTOMY  ~1996   FOOT NEUROMA SURGERY  ~ 2003   right   RE-EXCISION OF BREAST LUMPECTOMY Left 03/03/2020   Procedure: RE-EXCISION MARGINS OF LEFT BREAST LUMPECTOMY;  Surgeon: Dareen Ebbing, MD;  Location: [facility];  Service: General;  Laterality: Left;   ROTATOR CUFF REPAIR  ~ 2009   left   SHOULDER ARTHROSCOPY W/ ROTATOR CUFF REPAIR Right 09/21/2011   SKIN CANCER EXCISION  nose, forehead, left leg   TEE WITHOUT CARDIOVERSION N/A 03/01/2017   Procedure: TRANSESOPHAGEAL ECHOCARDIOGRAM (TEE);  Surgeon: Maudine Sos, MD;  Location: [facility] ENDOSCOPY;  Service: Cardiovascular;  Laterality: N/A;   THYROID  LOBECTOMY  ~ 2006   right   TUBAL LIGATION  ~ 1983   VAGINAL HYSTERECTOMY  ~ 2009   VESICOVAGINAL FISTULA CLOSURE W/ TAH     Patient Active Problem List   Diagnosis Date Noted   Encounter for procedure for purposes other than remedying health state, unspecified 05/09/2023   Cellulitis of right lower extremity 02/03/2023   Delayed healing of traumatic wound 02/03/2023   Biventricular ICD  (implantable cardioverter-defibrillator) in place    DOE (dyspnea on exertion) 04/07/2021   Age-related osteoporosis without current pathological fracture 11/29/2020   Atrophy of vagina 11/29/2020   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   Breast CA (HCC) 02/18/2020   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   Rosacea 11/05/2019   Scar 11/05/2019   Biventricular cardiac pacemaker in situ 10/08/2018   Long term current use of anticoagulant therapy 10/23/2017   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   Trigeminal neuralgia 12/14/2012   Insomnia, persistent 11/03/2011   AICD (automatic cardioverter/defibrillator) present    Aortic regurgitation    Nonischemic cardiomyopathy (HCC) 01/21/2011   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/17/2010   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   Esophagitis, reflux 08/24/2010   Idiopathic peripheral neuropathy 08/24/2010   Chronic systolic CHF (congestive heart failure) (HCC) 02/24/2010   Paroxysmal atrial fibrillation (HCC) 02/23/2010   Hypothyroidism    History of migraine headaches    Left bundle branch block    Idiopathic scoliosis and kyphoscoliosis     Class: Chronic   Lumbar canal stenosis 12/25/2009   Menopause 04/15/2009   Anxiety state 07/24/2008   Avitaminosis D 03/21/2008   Atypical migraine 01/30/2008   Benign essential HTN 01/30/2008   Chronic migraine w/o aura w/o status migrainosus, not intractable 01/30/2008   Paroxysmal digital cyanosis 02/15/2007   Raynaud's phenomenon 02/15/2007    PCP: Persons, Norma Beckers, [place]  REFERRING PROVIDER: Persons, Norma Beckers, [place]  REFERRING DIAG: M81.0 (ICD-10-CM) - Age-related osteoporosis without current pathological fracture  THERAPY DIAG:  Cramp and spasm  Muscle weakness (generalized)  Abnormal posture  Difficulty in walking, not elsewhere classified  Stiffness of left shoulder, not elsewhere classified  Chronic left shoulder  pain  Rationale for Evaluation and Treatment: Rehabilitation  ONSET DATE: 04/24/2023  SUBJECTIVE:  SUBJECTIVE STATEMENT: I'm doing ok.  Last visit:  Patient reports she saw Dr. Rochelle Chu.  He did more xrays of her neck and discussed options.  He is concerned over attempting a fusion given her T scores for osteoporosis.  But he did offer a disc procedure that would be a safer option.  He would like for her to start her infusions for her bone density and come back for f/u in 2 months to see if her T score is improved.    Eval:  This is patient's second episode recently.  She reports she continues to have neck pain and left shoulder pain and has been diagnosed with osteoporosis.  She has not done her exercises from last episode of PT.  She was afraid that they were hurting her.  She expresses concern over having any invasive treatment due to a bad experience in her past.  She has pacemaker as well.  She hopes to gain strength and reduce her overall pain.   Hand dominance: Right  PERTINENT HISTORY:  Long hx of neck and left shoulder pain, has been advised to have left shoulder replacement  PAIN:  6/12: Are you having pain? Yes: NPRS scale: 2/10  Pain location: neck and shoulder, left foot and leg   Pain description: aching, tight Aggravating factors: driving, reaching overhead Relieving factors: Not using the left arm  PRECAUTIONS: None  RED FLAGS: None     WEIGHT BEARING RESTRICTIONS: No  FALLS:  Has patient fallen in last 6 months? Yes. Number of falls 1  LIVING ENVIRONMENT: Lives with: lives with their spouse Lives in: House/apartment   OCCUPATION: retired  PLOF: Independent, Independent with basic ADLs, Independent with household mobility without device, Independent with community  mobility without device, Independent with homemaking with ambulation, Independent with gait, and Independent with transfers  PATIENT GOALS: to eliminate pain  NEXT MD VISIT: prn  OBJECTIVE:  Note: Objective measures were completed at Evaluation unless otherwise noted.  DIAGNOSTIC FINDINGS:  IMPRESSION: 1. Fused thoracic scoliosis with compensatory cervical curvature and multilevel degeneration. 2. Moderate foraminal impingement bilaterally at C5-6 and C6-7, similar to 08/30/2019 cervical MRI. The spinal canal appears widely patent.  COGNITION: Overall cognitive status: Within functional limits for tasks assessed  SENSATION: WFL  POSTURE: rounded shoulders, forward head, and thoracic fusion and scoliosis  PALPATION: Severe fascial restriction and trigger points bilateral upper traps   CERVICAL ROM:   Active ROM A/PROM (deg) eval 6/12  Flexion 30 45  Extension 20 10  Right lateral flexion 12 20  Left lateral flexion 8 12  Right rotation 30 33  Left rotation 20 23   (Blank rows = not tested)  UPPER EXTREMITY ROM:  Left UE significantly restricted due to end stage OA.  Provider has suggested shoulder replacement.   UPPER EXTREMITY MMT:  MMT Right eval Left eval  Shoulder flexion    Shoulder extension    Shoulder abduction    Shoulder adduction    Shoulder extension    Shoulder internal rotation    Shoulder external rotation    Middle trapezius    Lower trapezius    Elbow flexion    Elbow extension    Wrist flexion    Wrist extension    Wrist ulnar deviation    Wrist radial deviation    Wrist pronation    Wrist supination    Grip strength     (Blank rows = not tested)   FUNCTIONAL TESTS:  5 times sit to stand: 10.51  sec Timed up and go (TUG): 9.49 sec   TREATMENT DATE: 06/22/23 Nustep x 10 min level 5 while discussing status MELT techniques C spine: rotation x 20, flex/ext x 20, figure 8's each side x 10 Manual C spine ROM,  upper trap  stretching, levator stretching, manual traction and sub occipital release Moist heat to c spine x 10 min at end of session  TREATMENT DATE: 06/22/23 Nustep x 10 min level 5 while discussing status Supine blue loop (light) steering wheels 10x Supine blue loop (light) clocks 3 ways 5x each side Supine cervical retraction against pillow + foam 10x Sit to stand mat table + foam 5x  (cues for patellofemoral alignment) Grip strength Digiflex 5# green individual fingers and gross grip 10x each right/left  Flexbar twist 5x right/left  Cervical ROM Sit to stand chair + foam holding 4# plyoball  Seated cervical head press into yellow band 10x   TREATMENT DATE: 06/20/23 Nustep x 10 min level 5 while discussing status Supine cane press 2# steering wheels 10x Supine ball squeeze fist to palm 5 sec hold 10x Supine ball squeeze with bil elevation 10x Supine steering wheels with cane+ 2# 10x Supine cervical retraction against therapist hand behind the head for tactile cue, pillow + foam 10x Supine shoulder external rotation yellow band 10x Sit to stand mat table + foam 5x  (cues for patellofemoral alignment) Grip strength 35# right/left Seated cervical head press into yellow band 10x  Seated head push into purple ball on wall 10x 5 sec hold   PATIENT EDUCATION:  Education details: See above Person educated: Patient Education method: Explanation Education comprehension: verbalized understanding  HOME EXERCISE PROGRAM: Access Code: [email or web address] URL: [email or web address] Date: 06/13/2023 Prepared by: Darien Eden  Exercises - Sit to Stand  - 2 x daily - 7 x weekly - 1 sets - 5 reps - Wrist Extension with Dumbbell  - 1 x daily - 7 x weekly - 1 sets - 10 reps - Seated Pronation Supination with Dumbbell  - 1 x daily - 7 x weekly - 1 sets - 10 reps - [employer] Carry with Kettlebells  - 1 x daily - 7 x weekly - 1 sets - 10 reps - Supine Cervical Retraction with Towel  - 1 x daily - 7 x  weekly - 1 sets - 10 reps - 5 hold - Cervical Retraction with Resistance  - 1 x daily - 7 x weekly - 1 sets - 10 reps - 5 hold  ASSESSMENT:  CLINICAL IMPRESSION: Clotine is showing slight progress but continues to have significant c spine ROM restriction.  Her pain level seems to be a little more controlled.  She would benefit from continuing skilled PT to progress toward final goals.    Prior visit:  Aron met with neurosurgeon.  He is concerned over attempting a fusion given her T scores for osteoporosis.  But he did offer a disc procedure that would be a safer option.  He would like for her to start her infusions for her bone density and come back for f/u in 2 months to see if her T score is improved.  We tried traction today with consideration for her T score.  She seemed to respond well to this.  We had another long discussion about the stress she is under with caring for her sister and how much this is likely impacting her overall health.  She will continue her HEP.  She is intermittently compliant with her HEP.  She  would benefit from continued skilled PT to address severe neck pain and guarding.    OBJECTIVE IMPAIRMENTS: Abnormal gait, cardiopulmonary status limiting activity, decreased balance, decreased endurance, difficulty walking, decreased ROM, decreased strength, hypomobility, increased fascial restrictions, increased muscle spasms, impaired flexibility, postural dysfunction, and pain.   ACTIVITY LIMITATIONS: carrying, lifting, bending, sitting, standing, squatting, sleeping, stairs, transfers, bed mobility, continence, bathing, toileting, dressing, reach over head, hygiene/grooming, and caring for others  PARTICIPATION LIMITATIONS: meal prep, cleaning, laundry, driving, shopping, and community activity  PERSONAL FACTORS: Behavior pattern, Fitness, Past/current experiences, Time since onset of injury/illness/exacerbation, and 3+ comorbidities: scoliosis, htn, pacemaker, and CHF are  also affecting patient's functional outcome.   REHAB POTENTIAL: Fair due to recent episode and still opting to decline surgery  CLINICAL DECISION MAKING: Unstable/unpredictable  EVALUATION COMPLEXITY: High   GOALS: Goals reviewed with patient? Yes  SHORT TERM GOALS: Target date: 06/07/2023  Pain report to be no greater than 4/10  Baseline:  Goal status: met 6/12  2.  Patient will be independent with initial HEP  Baseline:  Goal status: met 6/12  LONG TERM GOALS: Target date: 07/05/2023  Patient to report pain no greater than 2/10  Baseline:  Goal status: In progress  2.  Patient to be independent with advanced HEP  Baseline:  Goal status: INITIAL  3.  Patient to demonstrate consistency with some level of exercise Baseline:  Goal status: INITIAL  4.  C spine ROM to improve by 2-3 degrees each direction Baseline:  Goal status: INITIAL  5.  Patient to be able to stand or walk for at least 15 min without leg pain  Baseline:  Goal status: INITIAL  6.  Patient to have consult with orthopedic provider Baseline:  Goal status: met    PLAN:  PT FREQUENCY: 1-2x/week  PT DURATION: 8 weeks  PLANNED INTERVENTIONS: 97110-Therapeutic exercises, 97530- Therapeutic activity, [id number]- Neuromuscular re-education, 97535- Self Care, 30160- Manual therapy, [phone]- Gait training, [phone]- Canalith repositioning, [id number]- Aquatic Therapy, [phone]- Electrical stimulation (unattended), [phone]- Electrical stimulation (manual), [id number]- Vasopneumatic device, [id number]- Ultrasound, [id number]- Ionotophoresis 4mg /ml Dexamethasone , Patient/Family education, Balance training, Stair training, Taping, Dry Needling, Joint mobilization, Spinal mobilization, Vestibular training, Visual/preceptual remediation/compensation, Cryotherapy, and Moist heat  PLAN FOR NEXT SESSION:  cervical retraction/head presses in supine and sitting; Nustep, cervical traction: limited pull weight due to osteoporosis, left shoulder ROM, MELT;  grip strengthening  Clarise Chacko B. Keyleigh Manninen, PT 06/28/23 7:26 AM [facility] [address], [address] [place], [place] [address] Phone # [phone] Fax [phone]

## 2023-06-29 ENCOUNTER — Ambulatory Visit

## 2023-06-29 DIAGNOSIS — M25612 Stiffness of left shoulder, not elsewhere classified: Secondary | ICD-10-CM

## 2023-06-29 DIAGNOSIS — M6281 Muscle weakness (generalized): Secondary | ICD-10-CM | POA: Diagnosis not present

## 2023-06-29 DIAGNOSIS — G8929 Other chronic pain: Secondary | ICD-10-CM

## 2023-06-29 DIAGNOSIS — R262 Difficulty in walking, not elsewhere classified: Secondary | ICD-10-CM | POA: Diagnosis not present

## 2023-06-29 DIAGNOSIS — R252 Cramp and spasm: Secondary | ICD-10-CM

## 2023-06-29 DIAGNOSIS — M25512 Pain in left shoulder: Secondary | ICD-10-CM | POA: Diagnosis not present

## 2023-06-29 DIAGNOSIS — R293 Abnormal posture: Secondary | ICD-10-CM | POA: Diagnosis not present

## 2023-06-29 NOTE — Therapy (Signed)
 OUTPATIENT PHYSICAL THERAPY CERVICAL PROGRESS   Patient Name: [name] MRN: [id number] DOB:04/05/1955, 68 y.o., female Today's Date: 06/29/2023  END OF SESSION:  PT End of Session - 06/29/23 1147     Visit Number 10    Date for PT Re-Evaluation 07/05/23    Authorization Type [employer] 16 visits  4/30-6/25    Authorization - Visit Number 10    Authorization - Number of Visits 16    Progress Note Due on Visit 20    PT Start Time 1148    PT Stop Time 1235    PT Time Calculation (min) 47 min    Activity Tolerance Patient limited by pain    Behavior During Therapy Anxious;Flat affect          Past Medical History:  Diagnosis Date   AF (paroxysmal atrial fibrillation) (HCC)    Arthritis    BCC (basal cell carcinoma) 03/11/2003   left distal lower leg ankle-tx dr Alanda Allegra   Biventricular cardiac pacemaker in situ    a. prior CRT-D in 2013 with downgrade to CRT-Pacemaker only in 11/2016.   Breast cancer (HCC) 12/12/2019   Chronic systolic CHF (congestive heart failure) (HCC)    Complication of anesthesia    aspiration   Essential tremor    GERD (gastroesophageal reflux disease)    otc   Hypertension    Hypothyroidism    LBBB (left bundle branch block)    conduction disorder   Migraines    Mild aortic insufficiency    NICM (nonischemic cardiomyopathy) (HCC)    a. 2012: normal coronaries, EF 25-30%. b. improved to 55% 11/2016.   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   in situ- right lower leg-anterior ([id number])   Scoliosis    Past Surgical History:  Procedure Laterality Date   ARTERY BIOPSY  12/29/2011   Procedure: MINOR BIOPSY TEMPORAL ARTERY;  Surgeon: Quitman Bucy, MD;  Location: [facility];  Service: General;   Laterality: Right;  Right temporal artery biopsy   BACK SURGERY  12/31/2009   for flat back syndrome; fused bottom of my back   BI-VENTRICULAR IMPLANTABLE CARDIOVERTER DEFIBRILLATOR Left 01/20/2011   Procedure: BI-VENTRICULAR IMPLANTABLE CARDIOVERTER DEFIBRILLATOR  (CRT-D);  Surgeon: Tammie Fall, MD;  Location: [facility] CATH LAB;  Service: Cardiovascular;  Laterality: Left;   BIV ICD GENERATOR CHANGEOUT N/A 11/24/2016   Procedure: BIV ICD GENERATOR CHANGEOUT;  Surgeon: Tammie Fall, MD;  Location: [facility] INVASIVE CV LAB;  Service: Cardiovascular;  Laterality: N/A;   BREAST LUMPECTOMY WITH RADIOACTIVE SEED LOCALIZATION Left 01/22/2020   Procedure: LEFT BREAST LUMPECTOMY WITH RADIOACTIVE SEED LOCALIZATION;  Surgeon: Dareen Ebbing, MD;  Location:  SURGERY CENTER;  Service: General;  Laterality: Left;  LMA   CERVICAL ABLATION  03/29/2023   CHOLECYSTECTOMY  ~1996   FOOT NEUROMA SURGERY  ~ 2003   right   RE-EXCISION OF BREAST LUMPECTOMY Left 03/03/2020   Procedure: RE-EXCISION MARGINS OF LEFT BREAST LUMPECTOMY;  Surgeon: Dareen Ebbing, MD;  Location:  SURGERY CENTER;  Service: General;  Laterality: Left;   ROTATOR CUFF REPAIR  ~ 2009   left   SHOULDER ARTHROSCOPY W/ ROTATOR CUFF REPAIR Right 09/21/2011   SKIN CANCER EXCISION     nose, forehead, left leg  TEE WITHOUT CARDIOVERSION N/A 03/01/2017   Procedure: TRANSESOPHAGEAL ECHOCARDIOGRAM (TEE);  Surgeon: Maudine Sos, MD;  Location: [facility] ENDOSCOPY;  Service: Cardiovascular;  Laterality: N/A;   THYROID  LOBECTOMY  ~ 2006   right   TUBAL LIGATION  ~ 1983   VAGINAL HYSTERECTOMY  ~ 2009   VESICOVAGINAL FISTULA CLOSURE W/ TAH     Patient Active Problem List   Diagnosis Date Noted   Encounter for procedure for purposes other than remedying health state, unspecified 05/09/2023   Cellulitis of right lower extremity 02/03/2023   Delayed healing of traumatic wound 02/03/2023   Biventricular ICD (implantable  cardioverter-defibrillator) in place    DOE (dyspnea on exertion) 04/07/2021   Age-related osteoporosis without current pathological fracture 11/29/2020   Atrophy of vagina 11/29/2020   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   Breast CA (HCC) 02/18/2020   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   Rosacea 11/05/2019   Scar 11/05/2019   Biventricular cardiac pacemaker in situ 10/08/2018   Long term current use of anticoagulant therapy 10/23/2017   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   Trigeminal neuralgia 12/14/2012   Insomnia, persistent 11/03/2011   AICD (automatic cardioverter/defibrillator) present    Aortic regurgitation    Nonischemic cardiomyopathy (HCC) 01/21/2011   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/17/2010   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   Esophagitis, reflux 08/24/2010   Idiopathic peripheral neuropathy 08/24/2010   Chronic systolic CHF (congestive heart failure) (HCC) 02/24/2010   Paroxysmal atrial fibrillation (HCC) 02/23/2010   Hypothyroidism    History of migraine headaches    Left bundle branch block    Idiopathic scoliosis and kyphoscoliosis     Class: Chronic   Lumbar canal stenosis 12/25/2009   Menopause 04/15/2009   Anxiety state 07/24/2008   Avitaminosis D 03/21/2008   Atypical migraine 01/30/2008   Benign essential HTN 01/30/2008   Chronic migraine w/o aura w/o status migrainosus, not intractable 01/30/2008   Paroxysmal digital cyanosis 02/15/2007   Raynaud's phenomenon 02/15/2007    PCP: Persons, Norma Beckers, [place]  REFERRING PROVIDER: Persons, Norma Beckers, [place]  REFERRING DIAG: M81.0 (ICD-10-CM) - Age-related osteoporosis without current pathological fracture  THERAPY DIAG:  Muscle weakness (generalized)  Abnormal posture  Stiffness of left shoulder, not elsewhere classified  Cramp and spasm  Chronic left shoulder pain  Rationale for Evaluation and Treatment:  Rehabilitation  ONSET DATE: 04/24/2023  SUBJECTIVE:  SUBJECTIVE STATEMENT: I woke up with a migraine this morning.  I prefer not to do the roller under my head like we did last time because that made me hurt (MELT)  Last visit:  Patient reports she saw Dr. Rochelle Chu.  He did more xrays of her neck and discussed options.  He is concerned over attempting a fusion given her T scores for osteoporosis.  But he did offer a disc procedure that would be a safer option.  He would like for her to start her infusions for her bone density and come back for f/u in 2 months to see if her T score is improved.    Eval:  This is patient's second episode recently.  She reports she continues to have neck pain and left shoulder pain and has been diagnosed with osteoporosis.  She has not done her exercises from last episode of PT.  She was afraid that they were hurting her.  She expresses concern over having any invasive treatment due to a bad experience in her past.  She has pacemaker as well.  She hopes to gain strength and reduce her overall pain.   Hand dominance: Right  PERTINENT HISTORY:  Long hx of neck and left shoulder pain, has been advised to have left shoulder replacement  PAIN:  6/19: Are you having pain? Yes: NPRS scale: 4/10  Pain location: neck and shoulder, left foot and leg   Pain description: aching, tight Aggravating factors: driving, reaching overhead Relieving factors: Not using the left arm  PRECAUTIONS: None  RED FLAGS: None     WEIGHT BEARING RESTRICTIONS: No  FALLS:  Has patient fallen in last 6 months? Yes. Number of falls 1  LIVING ENVIRONMENT: Lives with: lives with their spouse Lives in: House/apartment   OCCUPATION: retired  PLOF: Independent, Independent with basic ADLs,  Independent with household mobility without device, Independent with community mobility without device, Independent with homemaking with ambulation, Independent with gait, and Independent with transfers  PATIENT GOALS: to eliminate pain  NEXT MD VISIT: prn  OBJECTIVE:  Note: Objective measures were completed at Evaluation unless otherwise noted.  DIAGNOSTIC FINDINGS:  IMPRESSION: 1. Fused thoracic scoliosis with compensatory cervical curvature and multilevel degeneration. 2. Moderate foraminal impingement bilaterally at C5-6 and C6-7, similar to 08/30/2019 cervical MRI. The spinal canal appears widely patent.  COGNITION: Overall cognitive status: Within functional limits for tasks assessed  SENSATION: WFL  POSTURE: rounded shoulders, forward head, and thoracic fusion and scoliosis  PALPATION: Severe fascial restriction and trigger points bilateral upper traps   CERVICAL ROM:   Active ROM A/PROM (deg) eval 6/12 6/19  Flexion 30 45 45  Extension 20 10 30   Right lateral flexion 12 20 20   Left lateral flexion 8 12 22   Right rotation 30 33 40  Left rotation 20 23 40   (Blank rows = not tested)  UPPER EXTREMITY ROM:  Left UE significantly restricted due to end stage OA.  Provider has suggested shoulder replacement.   06/29/23: Functional ROM   UPPER EXTREMITY MMT:  06/29/23: Generally 4/5 bilateral UE's  FUNCTIONAL TESTS:  Eval 5 times sit to stand: 10.51 sec Timed up and go (TUG): 9.49 sec   06/29/23: 5 times sit to stand: 11.42 sec Timed up and go (TUG): 6.03 sec   TREATMENT DATE: 06/29/23 UBE x 6 min (patient ended up going 7 min 30 sec) 3/3 3 way scapular stabilization with blue loop x 5 each side 4D ball rolls x 20 each on each  UE Tband shoulder extension 2 x 10 with red band Tband shoulder rows 2 x 10 with red band Bilateral shoulder ER with red band 2 x 10 Bilateral shoulder horizontal abduction 2 x 10 with red band Towel stretch for cervical rotation  x 10 holding 5 seconds both sides 10th visit re-assessment completed  TREATMENT DATE: 06/22/23 Nustep x 10 min level 5 while discussing status MELT techniques C spine: rotation x 20, flex/ext x 20, figure 8's each side x 10 Manual C spine ROM,  upper trap stretching, levator stretching, manual traction and sub occipital release Moist heat to c spine x 10 min at end of session  TREATMENT DATE: 06/22/23 Nustep x 10 min level 5 while discussing status Supine blue loop (light) steering wheels 10x Supine blue loop (light) clocks 3 ways 5x each side Supine cervical retraction against pillow + foam 10x Sit to stand mat table + foam 5x  (cues for patellofemoral alignment) Grip strength Digiflex 5# green individual fingers and gross grip 10x each right/left  Flexbar twist 5x right/left  Cervical ROM Sit to stand chair + foam holding 4# plyoball  Seated cervical head press into yellow band 10x   TREATMENT DATE: 06/20/23 Nustep x 10 min level 5 while discussing status Supine cane press 2# steering wheels 10x Supine ball squeeze fist to palm 5 sec hold 10x Supine ball squeeze with bil elevation 10x Supine steering wheels with cane+ 2# 10x Supine cervical retraction against therapist hand behind the head for tactile cue, pillow + foam 10x Supine shoulder external rotation yellow band 10x Sit to stand mat table + foam 5x  (cues for patellofemoral alignment) Grip strength 35# right/left Seated cervical head press into yellow band 10x  Seated head push into purple ball on wall 10x 5 sec hold   PATIENT EDUCATION:  Education details: See above Person educated: Patient Education method: Explanation Education comprehension: verbalized understanding  HOME EXERCISE PROGRAM: Access Code: [email or web address] URL: [email or web address] Date: 06/29/2023 Prepared by: Aletha Anderson  Exercises - Sit to Stand  - 2 x daily - 7 x weekly - 1 sets - 5 reps - Wrist Extension with Dumbbell  - 1 x  daily - 7 x weekly - 1 sets - 10 reps - Seated Pronation Supination with Dumbbell  - 1 x daily - 7 x weekly - 1 sets - 10 reps - Farmer's Carry with Kettlebells  - 1 x daily - 7 x weekly - 1 sets - 10 reps - Supine Cervical Retraction with Towel  - 1 x daily - 7 x weekly - 1 sets - 10 reps - 5 hold - Cervical Retraction with Resistance  - 1 x daily - 7 x weekly - 1 sets - 10 reps - 5 hold - Seated Assisted Cervical Rotation with Towel  - 1 x daily - 7 x weekly - 1 sets - 10 reps  ASSESSMENT:  CLINICAL IMPRESSION: Toby is making slight progress but has a rather chronic pain tendency.  She admits she is fearful of falling and she thinks about dying a lot, fearing she is toward the end of her life.  We discussed then emotional toll this carries and need to meet with counselor or get psych referral.  PT offered encouragement in that she actually moves well and has good balance and stability for her age range, and that overall, she is fairly healthy.  She would benefit from inquiring about psych referral and continuing skilled PT to meet stated goals.  Prior visit:  Brae met with neurosurgeon.  He is concerned over attempting a fusion given her T scores for osteoporosis.  But he did offer a disc procedure that would be a safer option.  He would like for her to start her infusions for her bone density and come back for f/u in 2 months to see if her T score is improved.  We tried traction today with consideration for her T score.  She seemed to respond well to this.  We had another long discussion about the stress she is under with caring for her sister and how much this is likely impacting her overall health.  She will continue her HEP.  She is intermittently compliant with her HEP.  She would benefit from continued skilled PT to address severe neck pain and guarding.    OBJECTIVE IMPAIRMENTS: Abnormal gait, cardiopulmonary status limiting activity, decreased balance, decreased endurance, difficulty  walking, decreased ROM, decreased strength, hypomobility, increased fascial restrictions, increased muscle spasms, impaired flexibility, postural dysfunction, and pain.   ACTIVITY LIMITATIONS: carrying, lifting, bending, sitting, standing, squatting, sleeping, stairs, transfers, bed mobility, continence, bathing, toileting, dressing, reach over head, hygiene/grooming, and caring for others  PARTICIPATION LIMITATIONS: meal prep, cleaning, laundry, driving, shopping, and community activity  PERSONAL FACTORS: Behavior pattern, Fitness, Past/current experiences, Time since onset of injury/illness/exacerbation, and 3+ comorbidities: scoliosis, htn, pacemaker, and CHF are also affecting patient's functional outcome.   REHAB POTENTIAL: Fair due to recent episode and still opting to decline surgery  CLINICAL DECISION MAKING: Unstable/unpredictable  EVALUATION COMPLEXITY: High   GOALS: Goals reviewed with patient? Yes  SHORT TERM GOALS: Target date: 06/07/2023  Pain report to be no greater than 4/10  Baseline:  Goal status: met 6/12  2.  Patient will be independent with initial HEP  Baseline:  Goal status: met 6/12  LONG TERM GOALS: Target date: 07/05/2023  Patient to report pain no greater than 2/10  Baseline:  Goal status: In progress  2.  Patient to be independent with advanced HEP  Baseline:  Goal status: In progress  3.  Patient to demonstrate consistency with some level of exercise Baseline:  Goal status: MET 06/29/23  4.  C spine ROM to improve by 2-3 degrees each direction Baseline:  Goal status: MET 06/29/23  5.  Patient to be able to stand or walk for at least 15 min without leg pain  Baseline:  Goal status: INITIAL  6.  Patient to have consult with orthopedic provider Baseline:  Goal status: MET 06/29/23   PLAN:  PT FREQUENCY: 1-2x/week  PT DURATION: 8 weeks  PLANNED INTERVENTIONS: 97110-Therapeutic exercises, 97530- Therapeutic activity, 97112- Neuromuscular  re-education, 97535- Self Care, 13244- Manual therapy, [phone]- Gait training, [phone]- Canalith repositioning, [phone]- Aquatic Therapy, [phone]- Electrical stimulation (unattended), [phone]- Electrical stimulation (manual), 97016- Vasopneumatic device, [id number]- Ultrasound, [id number]- Ionotophoresis 4mg /ml Dexamethasone , Patient/Family education, Balance training, Stair training, Taping, Dry Needling, Joint mobilization, Spinal mobilization, Vestibular training, Visual/preceptual remediation/compensation, Cryotherapy, and Moist heat  PLAN FOR NEXT SESSION:  cervical retraction/head presses in supine and sitting; Nustep, cervical traction: limited pull weight due to osteoporosis, left shoulder ROM, (patient states that MELTis painful and she would prefer not to do this); grip strengthening  Danetra Glock B. Skylen Danielsen, PT 06/29/23 12:56 PM [facility] [address] [place], [place] [address] Phone # [phone] Fax [phone]

## 2023-07-04 ENCOUNTER — Ambulatory Visit

## 2023-07-04 DIAGNOSIS — M6281 Muscle weakness (generalized): Secondary | ICD-10-CM

## 2023-07-04 DIAGNOSIS — R252 Cramp and spasm: Secondary | ICD-10-CM | POA: Diagnosis not present

## 2023-07-04 DIAGNOSIS — R293 Abnormal posture: Secondary | ICD-10-CM

## 2023-07-04 DIAGNOSIS — G8929 Other chronic pain: Secondary | ICD-10-CM | POA: Diagnosis not present

## 2023-07-04 DIAGNOSIS — M25512 Pain in left shoulder: Secondary | ICD-10-CM | POA: Diagnosis not present

## 2023-07-04 DIAGNOSIS — R262 Difficulty in walking, not elsewhere classified: Secondary | ICD-10-CM | POA: Diagnosis not present

## 2023-07-04 DIAGNOSIS — M25612 Stiffness of left shoulder, not elsewhere classified: Secondary | ICD-10-CM

## 2023-07-04 NOTE — Therapy (Signed)
 OUTPATIENT PHYSICAL THERAPY CERVICAL PROGRESS   Patient Name: [name] MRN: [id number] DOB:06-18-1955, 68 y.o., female Today's Date: 07/04/2023  END OF SESSION:  PT End of Session - 07/04/23 1159     Visit Number 11    Number of Visits 16    Date for PT Re-Evaluation 07/05/23    Authorization Type [employer] 16 visits  4/30-6/25    Authorization - Visit Number 11    Authorization - Number of Visits 16    Progress Note Due on Visit 20    PT Start Time 1150    PT Stop Time 1235    PT Time Calculation (min) 45 min    Activity Tolerance Patient limited by pain    Behavior During Therapy Anxious;Flat affect          Past Medical History:  Diagnosis Date   AF (paroxysmal atrial fibrillation) (HCC)    Arthritis    BCC (basal cell carcinoma) 03/11/2003   left distal lower leg ankle-tx dr helga   Biventricular cardiac pacemaker in situ    a. prior CRT-D in 2013 with downgrade to CRT-Pacemaker only in 11/2016.   Breast cancer (HCC) 12/12/2019   Chronic systolic CHF (congestive heart failure) (HCC)    Complication of anesthesia    aspiration   Essential tremor    GERD (gastroesophageal reflux disease)    otc   Hypertension    Hypothyroidism    LBBB (left bundle branch block)    conduction disorder   Migraines    Mild aortic insufficiency    NICM (nonischemic cardiomyopathy) (HCC)    a. 2012: normal coronaries, EF 25-30%. b. improved to 55% 11/2016.   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   in situ- right lower leg-anterior ([id number])   Scoliosis    Past Surgical History:  Procedure Laterality Date   ARTERY BIOPSY  12/29/2011   Procedure: MINOR BIOPSY TEMPORAL ARTERY;  Surgeon: Morene ONEIDA Olives, MD;  Location: [facility];   Service: General;  Laterality: Right;  Right temporal artery biopsy   BACK SURGERY  12/31/2009   for flat back syndrome; fused bottom of my back   BI-VENTRICULAR IMPLANTABLE CARDIOVERTER DEFIBRILLATOR Left 01/20/2011   Procedure: BI-VENTRICULAR IMPLANTABLE CARDIOVERTER DEFIBRILLATOR  (CRT-D);  Surgeon: Danelle LELON Birmingham, MD;  Location: [facility] CATH LAB;  Service: Cardiovascular;  Laterality: Left;   BIV ICD GENERATOR CHANGEOUT N/A 11/24/2016   Procedure: BIV ICD GENERATOR CHANGEOUT;  Surgeon: Birmingham Danelle LELON, MD;  Location: [facility] INVASIVE CV LAB;  Service: Cardiovascular;  Laterality: N/A;   BREAST LUMPECTOMY WITH RADIOACTIVE SEED LOCALIZATION Left 01/22/2020   Procedure: LEFT BREAST LUMPECTOMY WITH RADIOACTIVE SEED LOCALIZATION;  Surgeon: Belinda Cough, MD;  Location: [facility];  Service: General;  Laterality: Left;  LMA   CERVICAL ABLATION  03/29/2023   CHOLECYSTECTOMY  ~1996   FOOT NEUROMA SURGERY  ~ 2003   right   RE-EXCISION OF BREAST LUMPECTOMY Left 03/03/2020   Procedure: RE-EXCISION MARGINS OF LEFT BREAST LUMPECTOMY;  Surgeon: Belinda Cough, MD;  Location: [facility];  Service: General;  Laterality: Left;   ROTATOR CUFF REPAIR  ~ 2009   left   SHOULDER ARTHROSCOPY W/ ROTATOR CUFF REPAIR Right 09/21/2011   SKIN CANCER EXCISION  nose, forehead, left leg   TEE WITHOUT CARDIOVERSION N/A 03/01/2017   Procedure: TRANSESOPHAGEAL ECHOCARDIOGRAM (TEE);  Surgeon: Raford Riggs, MD;  Location: [facility] ENDOSCOPY;  Service: Cardiovascular;  Laterality: N/A;   THYROID  LOBECTOMY  ~ 2006   right   TUBAL LIGATION  ~ 1983   VAGINAL HYSTERECTOMY  ~ 2009   VESICOVAGINAL FISTULA CLOSURE W/ TAH     Patient Active Problem List   Diagnosis Date Noted   Encounter for procedure for purposes other than remedying health state, unspecified 05/09/2023   Cellulitis of right lower extremity 02/03/2023   Delayed healing of traumatic wound 02/03/2023   Biventricular ICD (implantable  cardioverter-defibrillator) in place    DOE (dyspnea on exertion) 04/07/2021   Age-related osteoporosis without current pathological fracture 11/29/2020   Atrophy of vagina 11/29/2020   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   Breast CA (HCC) 02/18/2020   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   Rosacea 11/05/2019   Scar 11/05/2019   Biventricular cardiac pacemaker in situ 10/08/2018   Long term current use of anticoagulant therapy 10/23/2017   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   Trigeminal neuralgia 12/14/2012   Insomnia, persistent 11/03/2011   AICD (automatic cardioverter/defibrillator) present    Aortic regurgitation    Nonischemic cardiomyopathy (HCC) 01/21/2011   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/17/2010   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   Esophagitis, reflux 08/24/2010   Idiopathic peripheral neuropathy 08/24/2010   Chronic systolic CHF (congestive heart failure) (HCC) 02/24/2010   Paroxysmal atrial fibrillation (HCC) 02/23/2010   Hypothyroidism    History of migraine headaches    Left bundle branch block    Idiopathic scoliosis and kyphoscoliosis     Class: Chronic   Lumbar canal stenosis 12/25/2009   Menopause 04/15/2009   Anxiety state 07/24/2008   Avitaminosis D 03/21/2008   Atypical migraine 01/30/2008   Benign essential HTN 01/30/2008   Chronic migraine w/o aura w/o status migrainosus, not intractable 01/30/2008   Paroxysmal digital cyanosis 02/15/2007   Raynaud's phenomenon 02/15/2007    PCP: Persons, Ronal Dragon, [place]  REFERRING PROVIDER: Persons, Ronal Dragon, [place]  REFERRING DIAG: M81.0 (ICD-10-CM) - Age-related osteoporosis without current pathological fracture  THERAPY DIAG:  Muscle weakness (generalized)  Abnormal posture  Stiffness of left shoulder, not elsewhere classified  Cramp and spasm  Chronic left shoulder pain  Rationale for Evaluation and Treatment:  Rehabilitation  ONSET DATE: 04/24/2023  SUBJECTIVE:  SUBJECTIVE STATEMENT: Patient states she still has neck pain and headaches for which she takes hydromorphone  when it's at its worst and then ES Tylenol  on a daily basis.  She wants to work on her leg strength today.  Last visit:  Patient reports she saw Dr. Joshua.  He did more xrays of her neck and discussed options.  He is concerned over attempting a fusion given her T scores for osteoporosis.  But he did offer a disc procedure that would be a safer option.  He would like for her to start her infusions for her bone density and come back for f/u in 2 months to see if her T score is improved.    Eval:  This is patient's second episode recently.  She reports she continues to have neck pain and left shoulder pain and has been diagnosed with osteoporosis.  She has not done her exercises from last episode of PT.  She was afraid that they were hurting her.  She expresses concern over having any invasive treatment due to a bad experience in her past.  She has pacemaker as well.  She hopes to gain strength and reduce her overall pain.   Hand dominance: Right  PERTINENT HISTORY:  Long hx of neck and left shoulder pain, has been advised to have left shoulder replacement  PAIN:   07/04/23:  (I sometimes get up to 7/10 and have to take hydromorphone  , otherwise I just take Tylenol  ES every day) Are you having pain? Yes: NPRS scale: 3/10  Pain location: neck and shoulder, left foot and leg   Pain description: aching, tight Aggravating factors: driving, reaching overhead Relieving factors: Not using the left arm  PRECAUTIONS: None  RED FLAGS: None     WEIGHT BEARING RESTRICTIONS: No  FALLS:  Has patient fallen in last 6 months? Yes. Number of falls  1  LIVING ENVIRONMENT: Lives with: lives with their spouse Lives in: House/apartment   OCCUPATION: retired  PLOF: Independent, Independent with basic ADLs, Independent with household mobility without device, Independent with community mobility without device, Independent with homemaking with ambulation, Independent with gait, and Independent with transfers  PATIENT GOALS: to eliminate pain  NEXT MD VISIT: prn  OBJECTIVE:  Note: Objective measures were completed at Evaluation unless otherwise noted.  DIAGNOSTIC FINDINGS:  IMPRESSION: 1. Fused thoracic scoliosis with compensatory cervical curvature and multilevel degeneration. 2. Moderate foraminal impingement bilaterally at C5-6 and C6-7, similar to 08/30/2019 cervical MRI. The spinal canal appears widely patent.  COGNITION: Overall cognitive status: Within functional limits for tasks assessed  SENSATION: WFL  POSTURE: rounded shoulders, forward head, and thoracic fusion and scoliosis  PALPATION: Severe fascial restriction and trigger points bilateral upper traps   CERVICAL ROM:   Active ROM A/PROM (deg) eval 6/12 6/19  Flexion 30 45 45  Extension 20 10 30   Right lateral flexion 12 20 20   Left lateral flexion 8 12 22   Right rotation 30 33 40  Left rotation 20 23 40   (Blank rows = not tested)  UPPER EXTREMITY ROM:  Left UE significantly restricted due to end stage OA.  Provider has suggested shoulder replacement.   06/29/23: Functional ROM   UPPER EXTREMITY MMT:  06/29/23: Generally 4/5 bilateral UE's  FUNCTIONAL TESTS:  Eval 5 times sit to stand: 10.51 sec Timed up and go (TUG): 9.49 sec   06/29/23: 5 times sit to stand: 11.42 sec Timed up and go (TUG): 6.03 sec   TREATMENT DATE: 07/04/23 UBE x 6  min ) 3/3 3 way scapular stabilization with blue loop x 5 each side 4D ball rolls x 20 each on each UE Tband shoulder extension 2 x 10 with red band Tband shoulder rows 2 x 10 with red band Seated  clamshell with red loop x 20 Sit to stand 2 x 10 Seated LAQ 2 x 10 with 4 lbs Seated march 2 x 10 with 4 lbs Seated hip ER 2 x 10 with 4 lbs Standing hip ext and abduction with 4 lbs 2 x 10 each LE in each direction Lateral band walks by back wall for UE support with yellow band tied x 3 laps of 10 feet  TREATMENT DATE: 06/29/23 UBE x 6 min (patient ended up going 7 min 30 sec) 3/3 3 way scapular stabilization with blue loop x 5 each side 4D ball rolls x 20 each on each UE Tband shoulder extension 2 x 10 with red band Tband shoulder rows 2 x 10 with red band Bilateral shoulder ER with red band 2 x 10 Bilateral shoulder horizontal abduction 2 x 10 with red band Towel stretch for cervical rotation x 10 holding 5 seconds both sides 10th visit re-assessment completed  TREATMENT DATE: 06/22/23 Nustep x 10 min level 5 while discussing status MELT techniques C spine: rotation x 20, flex/ext x 20, figure 8's each side x 10 Manual C spine ROM,  upper trap stretching, levator stretching, manual traction and sub occipital release Moist heat to c spine x 10 min at end of session  TREATMENT DATE: 06/22/23 Nustep x 10 min level 5 while discussing status Supine blue loop (light) steering wheels 10x Supine blue loop (light) clocks 3 ways 5x each side Supine cervical retraction against pillow + foam 10x Sit to stand mat table + foam 5x  (cues for patellofemoral alignment) Grip strength Digiflex 5# green individual fingers and gross grip 10x each right/left  Flexbar twist 5x right/left  Cervical ROM Sit to stand chair + foam holding 4# plyoball  Seated cervical head press into yellow band 10x   TREATMENT DATE: 06/20/23 Nustep x 10 min level 5 while discussing status Supine cane press 2# steering wheels 10x Supine ball squeeze fist to palm 5 sec hold 10x Supine ball squeeze with bil elevation 10x Supine steering wheels with cane+ 2# 10x Supine cervical retraction against therapist hand behind the  head for tactile cue, pillow + foam 10x Supine shoulder external rotation yellow band 10x Sit to stand mat table + foam 5x  (cues for patellofemoral alignment) Grip strength 35# right/left Seated cervical head press into yellow band 10x  Seated head push into purple ball on wall 10x 5 sec hold   PATIENT EDUCATION:  Education details: See above Person educated: Patient Education method: Explanation Education comprehension: verbalized understanding  HOME EXERCISE PROGRAM: Access Code: [email or web address] URL: [email or web address] Date: 06/29/2023 Prepared by: Delon Haddock  Exercises - Sit to Stand  - 2 x daily - 7 x weekly - 1 sets - 5 reps - Wrist Extension with Dumbbell  - 1 x daily - 7 x weekly - 1 sets - 10 reps - Seated Pronation Supination with Dumbbell  - 1 x daily - 7 x weekly - 1 sets - 10 reps - [employer] with Kettlebells  - 1 x daily - 7 x weekly - 1 sets - 10 reps - Supine Cervical Retraction with Towel  - 1 x daily - 7 x weekly - 1 sets - 10 reps - 5 hold -  Cervical Retraction with Resistance  - 1 x daily - 7 x weekly - 1 sets - 10 reps - 5 hold - Seated Assisted Cervical Rotation with Towel  - 1 x daily - 7 x weekly - 1 sets - 10 reps  ASSESSMENT:  CLINICAL IMPRESSION: Miller is about the same.   She has 5 visits left.  She understands what to do for exercise for her osteoporosis but is admittedly poorly motivated.  She feels that if her husband would go work out with her, she'd be more motivated.   She tolerates a moderate level of exercise with ease.  She would benefit from a routine fitness program.  We will work on comprehensive exercise routine during her last few visits that she will be able to progress and continue independently.      Prior visit:  Anhthu met with neurosurgeon.  He is concerned over attempting a fusion given her T scores for osteoporosis.  But he did offer a disc procedure that would be a safer option.  He would like for her to start  her infusions for her bone density and come back for f/u in 2 months to see if her T score is improved.  We tried traction today with consideration for her T score.  She seemed to respond well to this.  We had another long discussion about the stress she is under with caring for her sister and how much this is likely impacting her overall health.  She will continue her HEP.  She is intermittently compliant with her HEP.  She would benefit from continued skilled PT to address severe neck pain and guarding.    OBJECTIVE IMPAIRMENTS: Abnormal gait, cardiopulmonary status limiting activity, decreased balance, decreased endurance, difficulty walking, decreased ROM, decreased strength, hypomobility, increased fascial restrictions, increased muscle spasms, impaired flexibility, postural dysfunction, and pain.   ACTIVITY LIMITATIONS: carrying, lifting, bending, sitting, standing, squatting, sleeping, stairs, transfers, bed mobility, continence, bathing, toileting, dressing, reach over head, hygiene/grooming, and caring for others  PARTICIPATION LIMITATIONS: meal prep, cleaning, laundry, driving, shopping, and community activity  PERSONAL FACTORS: Behavior pattern, Fitness, Past/current experiences, Time since onset of injury/illness/exacerbation, and 3+ comorbidities: scoliosis, htn, pacemaker, and CHF are also affecting patient's functional outcome.   REHAB POTENTIAL: Fair due to recent episode and still opting to decline surgery  CLINICAL DECISION MAKING: Unstable/unpredictable  EVALUATION COMPLEXITY: High   GOALS: Goals reviewed with patient? Yes  SHORT TERM GOALS: Target date: 06/07/2023  Pain report to be no greater than 4/10  Baseline:  Goal status: met 6/12  2.  Patient will be independent with initial HEP  Baseline:  Goal status: met 6/12  LONG TERM GOALS: Target date: 07/05/2023 (new target 08/29/2023)   Patient to report pain no greater than 2/10  Baseline:  Goal status: In  progress  2.  Patient to be independent with advanced HEP  Baseline:  Goal status: In progress  3.  Patient to demonstrate consistency with some level of exercise Baseline:  Goal status: MET 06/29/23  4.  C spine ROM to improve by 2-3 degrees each direction Baseline:  Goal status: MET 06/29/23  5.  Patient to be able to stand or walk for at least 15 min without leg pain  Baseline:  Goal status: INITIAL  6.  Patient to have consult with orthopedic provider Baseline:  Goal status: MET 06/29/23   PLAN:  PT FREQUENCY: 1-2x/week  PT DURATION: 8 weeks  PLANNED INTERVENTIONS: 97110-Therapeutic exercises, 97530- Therapeutic activity, 97112- Neuromuscular  re-education, [phone]- Self Care, 02859- Manual therapy, [phone]- Gait training, [phone]- Canalith repositioning, [id number]- Aquatic Therapy, [phone]- Electrical stimulation (unattended), [phone]- Electrical stimulation (manual), [id number]- Vasopneumatic device, [id number]- Ultrasound, [id number]- Ionotophoresis 4mg /ml Dexamethasone , Patient/Family education, Balance training, Stair training, Taping, Dry Needling, Joint mobilization, Spinal mobilization, Vestibular training, Visual/preceptual remediation/compensation, Cryotherapy, and Moist heat  PLAN FOR NEXT SESSION:  Nustep, cervical traction: limited pull weight due to osteoporosis, left shoulder ROM, (patient states that MELTis painful and she would prefer not to do this); grip strengthening  Cephus Tupy B. Genoa Freyre, PT 07/04/23 2:44 PM [facility] [address], [address] [place], [place] [address] Phone # [phone] Fax [phone]

## 2023-07-06 ENCOUNTER — Ambulatory Visit

## 2023-07-06 DIAGNOSIS — M6281 Muscle weakness (generalized): Secondary | ICD-10-CM | POA: Diagnosis not present

## 2023-07-06 DIAGNOSIS — M25612 Stiffness of left shoulder, not elsewhere classified: Secondary | ICD-10-CM

## 2023-07-06 DIAGNOSIS — G8929 Other chronic pain: Secondary | ICD-10-CM | POA: Diagnosis not present

## 2023-07-06 DIAGNOSIS — R252 Cramp and spasm: Secondary | ICD-10-CM

## 2023-07-06 DIAGNOSIS — R293 Abnormal posture: Secondary | ICD-10-CM

## 2023-07-06 DIAGNOSIS — M25512 Pain in left shoulder: Secondary | ICD-10-CM | POA: Diagnosis not present

## 2023-07-06 DIAGNOSIS — R262 Difficulty in walking, not elsewhere classified: Secondary | ICD-10-CM | POA: Diagnosis not present

## 2023-07-06 NOTE — Therapy (Signed)
 OUTPATIENT PHYSICAL THERAPY CERVICAL TREATMENT   Patient Name: [name] MRN: [id number] DOB:20-Sep-1955, 68 y.o., female Today's Date: 07/06/2023  END OF SESSION:  PT End of Session - 07/06/23 1146     Visit Number 12    Number of Visits 16    Date for PT Re-Evaluation 07/05/23    Authorization Time Period 5 visits 07/04/23 through 08/29/23    Authorization - Visit Number 1    Authorization - Number of Visits 5    Progress Note Due on Visit 30    PT Start Time 1145    PT Stop Time 1230    PT Time Calculation (min) 45 min    Activity Tolerance Patient limited by pain    Behavior During Therapy Anxious;Flat affect          Past Medical History:  Diagnosis Date   AF (paroxysmal atrial fibrillation) (HCC)    Arthritis    BCC (basal cell carcinoma) 03/11/2003   left distal lower leg ankle-tx dr helga   Biventricular cardiac pacemaker in situ    a. prior CRT-D in 2013 with downgrade to CRT-Pacemaker only in 11/2016.   Breast cancer (HCC) 12/12/2019   Chronic systolic CHF (congestive heart failure) (HCC)    Complication of anesthesia    aspiration   Essential tremor    GERD (gastroesophageal reflux disease)    otc   Hypertension    Hypothyroidism    LBBB (left bundle branch block)    conduction disorder   Migraines    Mild aortic insufficiency    NICM (nonischemic cardiomyopathy) (HCC)    a. 2012: normal coronaries, EF 25-30%. b. improved to 55% 11/2016.   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   in situ- right lower leg-anterior ([id number])   Scoliosis    Past Surgical History:  Procedure Laterality Date   ARTERY BIOPSY  12/29/2011   Procedure: MINOR BIOPSY TEMPORAL ARTERY;  Surgeon: Morene ONEIDA Olives, MD;  Location: [facility];   Service: General;  Laterality: Right;  Right temporal artery biopsy   BACK SURGERY  12/31/2009   for flat back syndrome; fused bottom of my back   BI-VENTRICULAR IMPLANTABLE CARDIOVERTER DEFIBRILLATOR Left 01/20/2011   Procedure: BI-VENTRICULAR IMPLANTABLE CARDIOVERTER DEFIBRILLATOR  (CRT-D);  Surgeon: Danelle LELON Birmingham, MD;  Location: [facility] CATH LAB;  Service: Cardiovascular;  Laterality: Left;   BIV ICD GENERATOR CHANGEOUT N/A 11/24/2016   Procedure: BIV ICD GENERATOR CHANGEOUT;  Surgeon: Birmingham Danelle LELON, MD;  Location: [facility] INVASIVE CV LAB;  Service: Cardiovascular;  Laterality: N/A;   BREAST LUMPECTOMY WITH RADIOACTIVE SEED LOCALIZATION Left 01/22/2020   Procedure: LEFT BREAST LUMPECTOMY WITH RADIOACTIVE SEED LOCALIZATION;  Surgeon: Belinda Cough, MD;  Location: [facility];  Service: General;  Laterality: Left;  LMA   CERVICAL ABLATION  03/29/2023   CHOLECYSTECTOMY  ~1996   FOOT NEUROMA SURGERY  ~ 2003   right   RE-EXCISION OF BREAST LUMPECTOMY Left 03/03/2020   Procedure: RE-EXCISION MARGINS OF LEFT BREAST LUMPECTOMY;  Surgeon: Belinda Cough, MD;  Location: [facility];  Service: General;  Laterality: Left;   ROTATOR CUFF REPAIR  ~ 2009   left   SHOULDER ARTHROSCOPY W/ ROTATOR CUFF REPAIR Right 09/21/2011   SKIN CANCER EXCISION  nose, forehead, left leg   TEE WITHOUT CARDIOVERSION N/A 03/01/2017   Procedure: TRANSESOPHAGEAL ECHOCARDIOGRAM (TEE);  Surgeon: Raford Riggs, MD;  Location: [facility] ENDOSCOPY;  Service: Cardiovascular;  Laterality: N/A;   THYROID  LOBECTOMY  ~ 2006   right   TUBAL LIGATION  ~ 1983   VAGINAL HYSTERECTOMY  ~ 2009   VESICOVAGINAL FISTULA CLOSURE W/ TAH     Patient Active Problem List   Diagnosis Date Noted   Encounter for procedure for purposes other than remedying health state, unspecified 05/09/2023   Cellulitis of right lower extremity 02/03/2023   Delayed healing of traumatic wound 02/03/2023   Biventricular ICD (implantable  cardioverter-defibrillator) in place    DOE (dyspnea on exertion) 04/07/2021   Age-related osteoporosis without current pathological fracture 11/29/2020   Atrophy of vagina 11/29/2020   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   Breast CA (HCC) 02/18/2020   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   Rosacea 11/05/2019   Scar 11/05/2019   Biventricular cardiac pacemaker in situ 10/08/2018   Long term current use of anticoagulant therapy 10/23/2017   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   Trigeminal neuralgia 12/14/2012   Insomnia, persistent 11/03/2011   AICD (automatic cardioverter/defibrillator) present    Aortic regurgitation    Nonischemic cardiomyopathy (HCC) 01/21/2011   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/17/2010   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   Esophagitis, reflux 08/24/2010   Idiopathic peripheral neuropathy 08/24/2010   Chronic systolic CHF (congestive heart failure) (HCC) 02/24/2010   Paroxysmal atrial fibrillation (HCC) 02/23/2010   Hypothyroidism    History of migraine headaches    Left bundle branch block    Idiopathic scoliosis and kyphoscoliosis     Class: Chronic   Lumbar canal stenosis 12/25/2009   Menopause 04/15/2009   Anxiety state 07/24/2008   Avitaminosis D 03/21/2008   Atypical migraine 01/30/2008   Benign essential HTN 01/30/2008   Chronic migraine w/o aura w/o status migrainosus, not intractable 01/30/2008   Paroxysmal digital cyanosis 02/15/2007   Raynaud's phenomenon 02/15/2007    PCP: Persons, Ronal Dragon, [place]  REFERRING PROVIDER: Persons, Ronal Dragon, [place]  REFERRING DIAG: M81.0 (ICD-10-CM) - Age-related osteoporosis without current pathological fracture  THERAPY DIAG:  Muscle weakness (generalized)  Abnormal posture  Stiffness of left shoulder, not elsewhere classified  Cramp and spasm  Chronic left shoulder pain  Difficulty in walking, not elsewhere classified  Rationale  for Evaluation and Treatment: Rehabilitation  ONSET DATE: 04/24/2023  SUBJECTIVE:  SUBJECTIVE STATEMENT: Patient reports she is doing pretty good.  She states she has slept good for the past 2 days.  I have been exercising more and walking her dog further.  She is also marching around the house.  I got my blue band out but couldn't remember what we did with that.    Last visit:  Patient reports she saw Dr. Joshua.  He did more xrays of her neck and discussed options.  He is concerned over attempting a fusion given her T scores for osteoporosis.  But he did offer a disc procedure that would be a safer option.  He would like for her to start her infusions for her bone density and come back for f/u in 2 months to see if her T score is improved.    Eval:  This is patient's second episode recently.  She reports she continues to have neck pain and left shoulder pain and has been diagnosed with osteoporosis.  She has not done her exercises from last episode of PT.  She was afraid that they were hurting her.  She expresses concern over having any invasive treatment due to a bad experience in her past.  She has pacemaker as well.  She hopes to gain strength and reduce her overall pain.   Hand dominance: Right  PERTINENT HISTORY:  Long hx of neck and left shoulder pain, has been advised to have left shoulder replacement  PAIN:   07/04/23:  (I sometimes get up to 7/10 and have to take hydromorphone  , otherwise I just take Tylenol  ES every day) Are you having pain? Yes: NPRS scale: 3/10  Pain location: neck and shoulder, left foot and leg   Pain description: aching, tight Aggravating factors: driving, reaching overhead Relieving factors: Not using the left arm  PRECAUTIONS: None  RED  FLAGS: None     WEIGHT BEARING RESTRICTIONS: No  FALLS:  Has patient fallen in last 6 months? Yes. Number of falls 1  LIVING ENVIRONMENT: Lives with: lives with their spouse Lives in: House/apartment   OCCUPATION: retired  PLOF: Independent, Independent with basic ADLs, Independent with household mobility without device, Independent with community mobility without device, Independent with homemaking with ambulation, Independent with gait, and Independent with transfers  PATIENT GOALS: to eliminate pain  NEXT MD VISIT: prn  OBJECTIVE:  Note: Objective measures were completed at Evaluation unless otherwise noted.  DIAGNOSTIC FINDINGS:  IMPRESSION: 1. Fused thoracic scoliosis with compensatory cervical curvature and multilevel degeneration. 2. Moderate foraminal impingement bilaterally at C5-6 and C6-7, similar to 08/30/2019 cervical MRI. The spinal canal appears widely patent.  COGNITION: Overall cognitive status: Within functional limits for tasks assessed  SENSATION: WFL  POSTURE: rounded shoulders, forward head, and thoracic fusion and scoliosis  PALPATION: Severe fascial restriction and trigger points bilateral upper traps   CERVICAL ROM:   Active ROM A/PROM (deg) eval 6/12 6/19  Flexion 30 45 45  Extension 20 10 30   Right lateral flexion 12 20 20   Left lateral flexion 8 12 22   Right rotation 30 33 40  Left rotation 20 23 40   (Blank rows = not tested)  UPPER EXTREMITY ROM:  Left UE significantly restricted due to end stage OA.  Provider has suggested shoulder replacement.   06/29/23: Functional ROM   UPPER EXTREMITY MMT:  06/29/23: Generally 4/5 bilateral UE's  FUNCTIONAL TESTS:  Eval 5 times sit to stand: 10.51 sec Timed up and go (TUG): 9.49 sec   06/29/23: 5 times sit to  stand: 11.42 sec Timed up and go (TUG): 6.03 sec   TREATMENT DATE: 07/06/23 UBE x 6 min ) 3/3 Tband shoulder extension 2 x 10 with red band Tband shoulder rows 2 x 10  with red band Seated shoulder ER with red tband 2 x 10 Seated shoulder horizontal abduction with red tband 2 x 10 3 way scapular stabilization with blue loop x 10 each side 4D ball rolls x 20 each on each UE Seated clamshell with red loop x 20 Sit to stand 2 x 10 Seated LAQ 2 x 10 with 4 lbs Seated march 2 x 10 with 4 lbs Seated hip ER 2 x 10 with 4 lbs Seated clamshell x 20 with yellow loop   TREATMENT DATE: 07/04/23 UBE x 6 min ) 3/3 3 way scapular stabilization with blue loop x 5 each side 4D ball rolls x 20 each on each UE Tband shoulder extension 2 x 10 with red band Tband shoulder rows 2 x 10 with red band Seated clamshell with red loop x 20 Sit to stand 2 x 10 Seated LAQ 2 x 10 with 4 lbs Seated march 2 x 10 with 4 lbs Seated hip ER 2 x 10 with 4 lbs Standing hip ext and abduction with 4 lbs 2 x 10 each LE in each direction Lateral band walks by back wall for UE support with yellow band tied x 3 laps of 10 feet  TREATMENT DATE: 06/29/23 UBE x 6 min (patient ended up going 7 min 30 sec) 3/3 3 way scapular stabilization with blue loop x 5 each side 4D ball rolls x 20 each on each UE Tband shoulder extension 2 x 10 with red band Tband shoulder rows 2 x 10 with red band Bilateral shoulder ER with red band 2 x 10 Bilateral shoulder horizontal abduction 2 x 10 with red band Towel stretch for cervical rotation x 10 holding 5 seconds both sides 10th visit re-assessment completed    PATIENT EDUCATION:  Education details: See above Person educated: Patient Education method: Explanation Education comprehension: verbalized understanding  HOME EXERCISE PROGRAM: Access Code: [email or web address] URL: [email or web address] Date: 07/06/2023 Prepared by: Delon Haddock  Exercises - Sit to Stand  - 2 x daily - 7 x weekly - 1 sets - 5 reps - Wrist Extension with Dumbbell  - 1 x daily - 7 x weekly - 1 sets - 10 reps - Seated Pronation Supination with Dumbbell  - 1 x daily -  7 x weekly - 1 sets - 10 reps - Farmer's Carry with Kettlebells  - 1 x daily - 7 x weekly - 1 sets - 10 reps - Supine Cervical Retraction with Towel  - 1 x daily - 7 x weekly - 1 sets - 10 reps - 5 hold - Cervical Retraction with Resistance  - 1 x daily - 7 x weekly - 1 sets - 10 reps - 5 hold - Seated Assisted Cervical Rotation with Towel  - 1 x daily - 7 x weekly - 1 sets - 10 reps - Seated Hip Abduction with Resistance  - 1 x daily - 7 x weekly - 2 sets - 10 reps - Seated Scapular Retraction  - 1 x daily - 7 x weekly - 2 sets - 10 reps - Shoulder Rolls in Sitting  - 1 x daily - 7 x weekly - 2 sets - 10 reps - Seated Cervical Retraction  - 1 x daily - 7 x weekly - 1 sets -  10 reps - Seated Cervical Rotation AROM  - 1 x daily - 7 x weekly - 1 sets - 10 reps - Seated Cervical Sidebending AROM  - 1 x daily - 7 x weekly - 1 sets - 10 reps - Shoulder extension with resistance - Neutral  - 1 x daily - 7 x weekly - 2 sets - 10 reps - Standing Shoulder Row with Anchored Resistance  - 1 x daily - 7 x weekly - 2 sets - 10 reps - Seated Shoulder External Rotation AAROM with Pulley  - 1 x daily - 7 x weekly - 2 sets - 10 reps - Seated Shoulder Horizontal Abduction with Resistance  - 1 x daily - 7 x weekly - 2 sets - 10 reps  ASSESSMENT:  CLINICAL IMPRESSION: Ryn seems to have made a turn with regard to motivation since last visit.  She pushed herself a little more at home and reports she is feeling better and slept better.  We continue to encourage a  routine fitness program.  We will work on comprehensive exercise routine during her last few visits that she will be able to progress and continue independently.      Prior visit:  Syleena met with neurosurgeon.  He is concerned over attempting a fusion given her T scores for osteoporosis.  But he did offer a disc procedure that would be a safer option.  He would like for her to start her infusions for her bone density and come back for f/u in 2 months to  see if her T score is improved.  We tried traction today with consideration for her T score.  She seemed to respond well to this.  We had another long discussion about the stress she is under with caring for her sister and how much this is likely impacting her overall health.  She will continue her HEP.  She is intermittently compliant with her HEP.  She would benefit from continued skilled PT to address severe neck pain and guarding.    OBJECTIVE IMPAIRMENTS: Abnormal gait, cardiopulmonary status limiting activity, decreased balance, decreased endurance, difficulty walking, decreased ROM, decreased strength, hypomobility, increased fascial restrictions, increased muscle spasms, impaired flexibility, postural dysfunction, and pain.   ACTIVITY LIMITATIONS: carrying, lifting, bending, sitting, standing, squatting, sleeping, stairs, transfers, bed mobility, continence, bathing, toileting, dressing, reach over head, hygiene/grooming, and caring for others  PARTICIPATION LIMITATIONS: meal prep, cleaning, laundry, driving, shopping, and community activity  PERSONAL FACTORS: Behavior pattern, Fitness, Past/current experiences, Time since onset of injury/illness/exacerbation, and 3+ comorbidities: scoliosis, htn, pacemaker, and CHF are also affecting patient's functional outcome.   REHAB POTENTIAL: Fair due to recent episode and still opting to decline surgery  CLINICAL DECISION MAKING: Unstable/unpredictable  EVALUATION COMPLEXITY: High   GOALS: Goals reviewed with patient? Yes  SHORT TERM GOALS: Target date: 06/07/2023  Pain report to be no greater than 4/10  Baseline:  Goal status: met 6/12  2.  Patient will be independent with initial HEP  Baseline:  Goal status: met 6/12  LONG TERM GOALS: Target date: 07/05/2023 (new target 08/29/2023)   Patient to report pain no greater than 2/10  Baseline:  Goal status: In progress  2.  Patient to be independent with advanced HEP  Baseline:  Goal  status: In progress  3.  Patient to demonstrate consistency with some level of exercise Baseline:  Goal status: MET 06/29/23  4.  C spine ROM to improve by 2-3 degrees each direction Baseline:  Goal status: MET  06/29/23  5.  Patient to be able to stand or walk for at least 15 min without leg pain  Baseline:  Goal status: INITIAL  6.  Patient to have consult with orthopedic provider Baseline:  Goal status: MET 06/29/23   PLAN:  PT FREQUENCY: 1-2x/week  PT DURATION: 8 weeks  PLANNED INTERVENTIONS: 97110-Therapeutic exercises, 97530- Therapeutic activity, 97112- Neuromuscular re-education, 97535- Self Care, [id number]- Manual therapy, [phone]- Gait training, [phone]- Canalith repositioning, [id number]- Aquatic Therapy, [phone]- Electrical stimulation (unattended), [phone]- Electrical stimulation (manual), [id number]- Vasopneumatic device, [id number]- Ultrasound, [id number]- Ionotophoresis 4mg /ml Dexamethasone , Patient/Family education, Balance training, Stair training, Taping, Dry Needling, Joint mobilization, Spinal mobilization, Vestibular training, Visual/preceptual remediation/compensation, Cryotherapy, and Moist heat  PLAN FOR NEXT SESSION:  Nustep, cervical traction: limited pull weight due to osteoporosis, left shoulder ROM, (patient states that MELTis painful and she would prefer not to do this); grip strengthening  Bookert Guzzi B. Kyandra Mcclaine, PT 07/06/23 5:34 PM [facility] [address], [address] [place], [place] [address] Phone # [phone] Fax [phone]

## 2023-07-10 ENCOUNTER — Encounter (HOSPITAL_BASED_OUTPATIENT_CLINIC_OR_DEPARTMENT_OTHER): Payer: Self-pay | Admitting: Family Medicine

## 2023-07-10 ENCOUNTER — Ambulatory Visit (HOSPITAL_BASED_OUTPATIENT_CLINIC_OR_DEPARTMENT_OTHER): Admitting: Family Medicine

## 2023-07-10 VITALS — BP 98/50 | HR 57 | Ht 67.5 in | Wt 163.5 lb

## 2023-07-10 DIAGNOSIS — G5603 Carpal tunnel syndrome, bilateral upper limbs: Secondary | ICD-10-CM | POA: Insufficient documentation

## 2023-07-10 DIAGNOSIS — E538 Deficiency of other specified B group vitamins: Secondary | ICD-10-CM | POA: Insufficient documentation

## 2023-07-10 DIAGNOSIS — G894 Chronic pain syndrome: Secondary | ICD-10-CM | POA: Insufficient documentation

## 2023-07-10 DIAGNOSIS — E039 Hypothyroidism, unspecified: Secondary | ICD-10-CM | POA: Diagnosis not present

## 2023-07-10 DIAGNOSIS — Z87440 Personal history of urinary (tract) infections: Secondary | ICD-10-CM

## 2023-07-10 DIAGNOSIS — M542 Cervicalgia: Secondary | ICD-10-CM | POA: Insufficient documentation

## 2023-07-10 DIAGNOSIS — G43719 Chronic migraine without aura, intractable, without status migrainosus: Secondary | ICD-10-CM | POA: Insufficient documentation

## 2023-07-10 DIAGNOSIS — G603 Idiopathic progressive neuropathy: Secondary | ICD-10-CM | POA: Insufficient documentation

## 2023-07-10 DIAGNOSIS — G43809 Other migraine, not intractable, without status migrainosus: Secondary | ICD-10-CM | POA: Insufficient documentation

## 2023-07-10 DIAGNOSIS — F419 Anxiety disorder, unspecified: Secondary | ICD-10-CM

## 2023-07-10 DIAGNOSIS — G2581 Restless legs syndrome: Secondary | ICD-10-CM | POA: Insufficient documentation

## 2023-07-10 DIAGNOSIS — G250 Essential tremor: Secondary | ICD-10-CM | POA: Insufficient documentation

## 2023-07-10 DIAGNOSIS — R300 Dysuria: Secondary | ICD-10-CM

## 2023-07-10 DIAGNOSIS — K5909 Other constipation: Secondary | ICD-10-CM

## 2023-07-10 DIAGNOSIS — F32A Depression, unspecified: Secondary | ICD-10-CM | POA: Diagnosis not present

## 2023-07-10 DIAGNOSIS — R413 Other amnesia: Secondary | ICD-10-CM | POA: Insufficient documentation

## 2023-07-10 DIAGNOSIS — F39 Unspecified mood [affective] disorder: Secondary | ICD-10-CM | POA: Insufficient documentation

## 2023-07-10 DIAGNOSIS — M40209 Unspecified kyphosis, site unspecified: Secondary | ICD-10-CM | POA: Insufficient documentation

## 2023-07-10 DIAGNOSIS — F411 Generalized anxiety disorder: Secondary | ICD-10-CM | POA: Insufficient documentation

## 2023-07-10 DIAGNOSIS — E890 Postprocedural hypothyroidism: Secondary | ICD-10-CM | POA: Insufficient documentation

## 2023-07-10 DIAGNOSIS — R252 Cramp and spasm: Secondary | ICD-10-CM | POA: Insufficient documentation

## 2023-07-10 DIAGNOSIS — M461 Sacroiliitis, not elsewhere classified: Secondary | ICD-10-CM | POA: Insufficient documentation

## 2023-07-10 DIAGNOSIS — M2042 Other hammer toe(s) (acquired), left foot: Secondary | ICD-10-CM | POA: Insufficient documentation

## 2023-07-10 LAB — POCT URINALYSIS DIP (CLINITEK)
Bilirubin, UA: NEGATIVE
Glucose, UA: NEGATIVE mg/dL
Ketones, POC UA: NEGATIVE mg/dL
Nitrite, UA: NEGATIVE
POC PROTEIN,UA: NEGATIVE
Spec Grav, UA: 1.015 (ref 1.010–1.025)
Urobilinogen, UA: 0.2 U/dL
pH, UA: 5.5 (ref 5.0–8.0)

## 2023-07-10 MED ORDER — LINACLOTIDE 145 MCG PO CAPS: 145.0000 ug | ORAL_CAPSULE | Freq: Every day | ORAL | 3 refills | Status: DC

## 2023-07-10 MED ORDER — SULFAMETHOXAZOLE-TRIMETHOPRIM 800-160 MG PO TABS: 1.0000 | ORAL_TABLET | Freq: Two times a day (BID) | ORAL | 0 refills | Status: AC

## 2023-07-10 NOTE — Addendum Note (Signed)
 Addended by: VICCI SELLER A on: 07/10/2023 10:09 AM   Modules accepted: Orders

## 2023-07-10 NOTE — Progress Notes (Signed)
 Remote pacemaker transmission.

## 2023-07-10 NOTE — Progress Notes (Unsigned)
 Subjective:   [name] [birth year] 07/10/2023  Chief Complaint  Patient presents with   Follow-up    3 month follow up patient is still having UTI has been taking old prescription nitrofurantoin  patient having burning and abodminal pain     HPI: [name] presents today for re-assessment and management of chronic medical conditions.   UTI:  Patient was recently seen on 06/21/2023 and 04/10/23 with symptoms of dysuria and subsequently treated with Bactrim . She had been previously treated with Macrobid  in March 2025 with temporary resolution of symptoms. Patient states she completed her Bactrim  therapy and had return of symptoms within 2 days. She is still having dysuria, increased frequency and urgency. She denies vaginal dryness, discharge, or bleeding.  No hx of smoking   HYPOTHYROIDISM: Patient presents for the medical management of Hypothyroidism. Current medication: Levothyroxine  100mcg every day  Patient compliant with medication regimen: yes  Fatigue: no Cold intolerance: no Weight gain/loss: no Constipation: no Lower extremity edema: no Palpitations: no Hoarseness: no Neck Pain/Compression: no Difficulty Swallowing: no  Lab Results  Component Value Date   TSH 1.360 12/05/2022   ANXIETY: [name] presents for the medical management of anxiety.  Current medication regimen: Wellbutrin  300mg  XR (previously increased within the past 6 weeks from 150mg  daily)  Well controlled: Yes, patient reports improvement of anxiety and depression. Feels increase of dosage is helping.  Denies SI/HI.     07/10/2023    2:51 PM 06/21/2023   10:29 AM 04/10/2023    1:44 PM 01/09/2023    2:42 PM  GAD 7 : Generalized Anxiety Score  Nervous, Anxious, on Edge 1 1 1 1   Control/stop worrying 1 1 3  0  Worry too much - different things 1 1 3  0  Trouble relaxing 0 0 0 0  Restless 0 0 0 0  Easily annoyed or irritable 0 1 2 0  Afraid - awful might happen 0 1 2 0   Total GAD 7 Score 3 5 11 1   Anxiety Difficulty Somewhat difficult Somewhat difficult Somewhat difficult Not difficult at all      07/10/2023    2:50 PM 06/21/2023   10:28 AM 04/10/2023    1:43 PM 01/09/2023    2:18 PM 12/05/2022    1:47 PM  Depression screen PHQ 2/9  Decreased Interest 0 1 3 1 2   Down, Depressed, Hopeless 2 1 3  0 2  PHQ - 2 Score 2 2 6 1 4   Altered sleeping 1 2 3 2 3   Tired, decreased energy 2 2 3 2 3   Change in appetite 1 2 2 2 3   Feeling bad or failure about yourself  2 1 1  0 2  Trouble concentrating 0 0 0 0 0  Moving slowly or fidgety/restless 0 0 1 2 0  Suicidal thoughts 0 0 0 0 0  PHQ-9 Score 8 9 16 9 15   Difficult doing work/chores Somewhat difficult Somewhat difficult Very difficult Not difficult at all Very difficult    CONSTIPATION:  Patient states she has been having intermittent constipation for several years. She uses stool softeners and Miralax  daily without improvement. She is interested in trying Linzess if possible. She states at times she will go 3-4 days without having a bowel movement and will have significant abdominal pain. Denies blood in stool. Last cscope 04/07/2022 (q2years)     The following portions of the patient's history were reviewed and updated as appropriate: past medical history, past surgical  history, family history, social history, allergies, medications, and problem list.   Patient Active Problem List   Diagnosis Date Noted   Chronic constipation 07/11/2023   Anxiety and depression 07/11/2023   B12 deficiency 07/10/2023   Benign essential tremor 07/10/2023   Bilateral carpal tunnel syndrome 07/10/2023   Acquired kyphosis 07/10/2023   Chronic pain syndrome 07/10/2023   Generalized anxiety disorder 07/10/2023   Idiopathic progressive polyneuropathy 07/10/2023   Intractable chronic migraine without aura 07/10/2023   Memory changes 07/10/2023   Migraine variant with headache 07/10/2023   Neck pain 07/10/2023    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   Other hammer toe(s) (acquired), left foot 07/10/2023   Restless legs syndrome 07/10/2023   Sacroiliitis, not elsewhere classified (HCC) 07/10/2023   Spasm 07/10/2023   Encounter for procedure for purposes other than remedying health state, unspecified 05/09/2023   Cellulitis of right lower extremity 02/03/2023   Delayed healing of traumatic wound 02/03/2023   Biventricular ICD (implantable cardioverter-defibrillator) in place    DOE (dyspnea on exertion) 04/07/2021   Age-related osteoporosis without current pathological fracture 11/29/2020   Atrophy of vagina 11/29/2020   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   Breast CA (HCC) 02/18/2020   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   Rosacea 11/05/2019   Scar 11/05/2019   Biventricular cardiac pacemaker in situ 10/08/2018   Long term current use of anticoagulant therapy 10/23/2017   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   Trigeminal neuralgia 12/14/2012   Insomnia, persistent 11/03/2011   AICD (automatic cardioverter/defibrillator) present    Aortic regurgitation    Nonischemic cardiomyopathy (HCC) 01/21/2011   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/17/2010   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   Esophagitis, reflux 08/24/2010   Idiopathic peripheral neuropathy 08/24/2010   Chronic systolic CHF (congestive heart failure) (HCC) 02/24/2010   Paroxysmal atrial fibrillation (HCC) 02/23/2010   Hypothyroidism    History of migraine headaches    Left bundle branch block    Idiopathic scoliosis and kyphoscoliosis     Class: Chronic   Lumbar canal stenosis 12/25/2009   Menopause 04/15/2009   Anxiety state 07/24/2008   Avitaminosis D 03/21/2008   Atypical migraine 01/30/2008   Benign essential HTN 01/30/2008   Chronic migraine w/o aura w/o status migrainosus, not intractable 01/30/2008   Paroxysmal digital cyanosis 02/15/2007    Raynaud's phenomenon 02/15/2007   Past Medical History:  Diagnosis Date   AF (paroxysmal atrial fibrillation) (HCC)    Allergy    Anxiety    Arthritis    BCC (basal cell carcinoma) 03/11/2003   left distal lower leg ankle-tx dr helga   Biventricular cardiac pacemaker in situ    a. prior CRT-D in 2013 with downgrade to CRT-Pacemaker only in 11/2016.   Breast cancer (HCC) 12/12/2019   Cataract    Chronic systolic CHF (congestive heart failure) (HCC)    Complication of anesthesia    aspiration   Depression    Essential tremor    GERD (gastroesophageal reflux disease)    otc   Hypertension    Hypothyroidism    LBBB (left bundle branch block)    conduction disorder   Migraines    Mild aortic insufficiency    Neuromuscular disorder (HCC)    NICM (nonischemic cardiomyopathy) (HCC)    a. 2012: normal coronaries, EF 25-30%. b. improved to 55% 11/2016.   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   in situ- right lower leg-anterior ([id number])   Scoliosis    Past Surgical History:  Procedure Laterality Date   ARTERY BIOPSY  12/29/2011   Procedure: MINOR BIOPSY TEMPORAL ARTERY;  Surgeon: Morene ONEIDA Olives, MD;  Location: [facility];  Service: General;  Laterality: Right;  Right temporal artery biopsy   BACK SURGERY  12/31/2009   for flat back syndrome; fused bottom of my back   BI-VENTRICULAR IMPLANTABLE CARDIOVERTER DEFIBRILLATOR Left 01/20/2011   Procedure: BI-VENTRICULAR IMPLANTABLE CARDIOVERTER DEFIBRILLATOR  (CRT-D);  Surgeon: Danelle LELON Birmingham, MD;  Location: [facility] CATH LAB;  Service: Cardiovascular;  Laterality: Left;   BIV ICD GENERATOR CHANGEOUT N/A 11/24/2016   Procedure: BIV ICD GENERATOR CHANGEOUT;  Surgeon: Birmingham Danelle LELON, MD;  Location: [facility] INVASIVE  CV LAB;  Service: Cardiovascular;  Laterality: N/A;   BREAST LUMPECTOMY WITH RADIOACTIVE SEED LOCALIZATION Left 01/22/2020   Procedure: LEFT BREAST LUMPECTOMY WITH RADIOACTIVE SEED LOCALIZATION;  Surgeon: Belinda Cough, MD;  Location: [facility];  Service: General;  Laterality: Left;  LMA   BREAST SURGERY     CERVICAL ABLATION  03/29/2023   CHOLECYSTECTOMY  ~1996   FOOT NEUROMA SURGERY  ~ 2003   right   RE-EXCISION OF BREAST LUMPECTOMY Left 03/03/2020   Procedure: RE-EXCISION MARGINS OF LEFT BREAST LUMPECTOMY;  Surgeon: Belinda Cough, MD;  Location: [facility];  Service: General;  Laterality: Left;   ROTATOR CUFF REPAIR  ~ 2009   left   SHOULDER ARTHROSCOPY W/ ROTATOR CUFF REPAIR Right 09/21/2011   SKIN CANCER EXCISION     nose, forehead, left leg   SPINE SURGERY     TEE WITHOUT CARDIOVERSION N/A 03/01/2017   Procedure: TRANSESOPHAGEAL ECHOCARDIOGRAM (TEE);  Surgeon: Raford Riggs, MD;  Location: [facility] ENDOSCOPY;  Service: Cardiovascular;  Laterality: N/A;   THYROID  LOBECTOMY  ~ 2006   right   TUBAL LIGATION  ~ 1983   VAGINAL HYSTERECTOMY  ~ 2009   VESICOVAGINAL FISTULA CLOSURE W/ TAH     Family History  Problem Relation Age of Onset   Heart disease Mother    Heart failure Mother 24       CHF   Stroke Mother    Hypertension Mother    Varicose Veins Mother    Heart disease Father 80   Macular degeneration Father    Hearing loss Father    Vision loss Father    Heart attack Sister    Depression Sister    Heart disease Sister    Varicose Veins Sister    Atrial fibrillation Sister    Heart disease Sister    Arthritis Sister    Outpatient Medications Prior to Visit  Medication Sig Dispense Refill   acetaminophen  (TYLENOL ) 325 MG tablet Take 650 mg by mouth as needed.     albuterol  (VENTOLIN  HFA) 108 (90 Base) MCG/ACT inhaler Inhale 2 puffs into the lungs every 6 (six) hours as needed for wheezing or shortness of breath. 8 g 2   ALPRAZolam   (XANAX ) 0.5 MG tablet Take 0.5 mg by mouth 3 (three) times daily as needed for anxiety.     apixaban  (ELIQUIS ) 5 MG TABS tablet Take 1 tablet (5 mg total) by mouth 2 (two) times daily. 180 tablet 1   BOTOX 200 units injection Once every 4 months     buPROPion  (WELLBUTRIN  XL) 300 MG  24 hr tablet Take 1 tablet (300 mg total) by mouth daily. 60 tablet 3   CALCIUM PO Take 2 tablets by mouth daily.     cetirizine (ZYRTEC) 10 MG tablet Take 10 mg by mouth daily.     eletriptan  (RELPAX ) 40 MG tablet Take 1 tablet (40 mg total) by mouth every 2 (two) hours as needed for migraine or headache. May repeat in 2 hours if headache persists or recurs. 10 tablet 2   famotidine  (PEPCID ) 20 MG tablet Take 20 mg by mouth daily as needed for heartburn or indigestion.     gabapentin  (NEURONTIN ) 600 MG tablet Take 600 mg by mouth 2 (two) times daily.     levothyroxine  (SYNTHROID ) 100 MCG tablet Take 1 tablet (100 mcg total) by mouth daily before breakfast. 90 tablet 0   Melatonin 5 MG CAPS Take 10 mg by mouth at bedtime.     Menthol  (BIOFREEZE) 10 % AERO      nystatin -triamcinolone  ointment (MYCOLOG) Apply 1 application to external vaginal surface twice daily for 7 days. 30 g 2   Omega-3 Fatty Acids (FISH OIL) 1000 MG CAPS 1 capsule     omeprazole  (PRILOSEC) 40 MG capsule Take 1 capsule (40 mg total) by mouth daily. 90 capsule 3   polyethylene glycol (MIRALAX  / GLYCOLAX ) packet Take 17 g by mouth every evening.     propranolol  (INDERAL ) 60 MG tablet Take 1 tablet (60 mg total) by mouth daily. 90 tablet 3   spironolactone  (ALDACTONE ) 25 MG tablet Take 0.5 tablets (12.5 mg total) by mouth daily. 45 tablet 3   tretinoin  (RETIN-A ) 0.025 % cream Apply topically at bedtime. Apply to face on Monday and Thursday NIGHTS 45 g 4   Varenicline  Tartrate (TYRVAYA ) 0.03 MG/ACT SOLN Place into the nose.     Cyanocobalamin  1000 MCG/ML KIT Inject 1,000 mcg as directed every 30 (thirty) days. (Patient not taking: Reported on 07/10/2023)      Facility-Administered Medications Prior to Visit  Medication Dose Route Frequency Provider Last Rate Last Admin   [START ON 07/16/2023] Romosozumab -aqqg (EVENITY ) 105 MG/1. injection 210 mg  210 mg Subcutaneous Q30 days Persons, Ronal Dragon, [place]       Allergies  Allergen Reactions   Other Nausea And Vomiting and Other (See Comments)    Most narcotic pain meds cause N/V and CHEST PAIN; needs an antiemetic to tolerate  Also, patient was diagnosed with Raynaud's disease and was told to NOT place IV(s) in either hand because of poor circulation   Iodine Other (See Comments)    Blisters   Tape Other (See Comments)    PATIENT'S SKIN IS VERY THIN; IT TEARS, BRUISES, AND BLEEDS VERY EASILY (PLEASE USE COBAN WRAP, IF POSSIBLE!!)   Codeine Other (See Comments)    Chest pain   Fentanyl  Other (See Comments)    Bad dreams   Morphine  And Codeine Nausea And Vomiting     ROS: A complete ROS was performed with pertinent positives/negatives noted in the HPI. The remainder of the ROS are negative.    Objective:   Today's Vitals   07/10/23 1446  BP: (!) 98/50  Pulse: (!) 57  SpO2: 100%  Weight: 163 lb 8 oz (74.2 kg)  Height: 5' 7.5 (1.715 m)  PainSc: 0-No pain    Physical Exam          GENERAL: Well-appearing, in NAD. Well nourished.  SKIN: Pink, warm and dry.  Head: Normocephalic. NECK: Trachea midline. Full ROM w/o pain or  tenderness.  RESPIRATORY: Chest wall symmetrical. Respirations even and non-labored. Breath sounds clear to auscultation bilaterally.  CARDIAC: S1, S2 present, regular rate and rhythm without murmur or gallops. Peripheral pulses 2+ bilaterally.  MSK: Muscle tone and strength appropriate for age.  NEUROLOGIC: No motor or sensory deficits. Steady, even gait. C2-C12 intact.  PSYCH/MENTAL STATUS: Alert, oriented x 3. Cooperative, appropriate mood and affect.   Health Maintenance Due  Topic Date Due   Hepatitis C Screening  Never done   Zoster Vaccines-  Shingrix (1 of 2) 10/19/1972   COVID-19 Vaccine (7 - 2024-25 season) 09/11/2022    Results for orders placed or performed in visit on 07/10/23  Microscopic Examination  Result Value Ref Range   WBC, UA 0-5 0 - 5 /hpf   RBC, Urine None seen 0 - 2 /hpf   Epithelial Cells (non renal) 0-10 0 - 10 /hpf   Casts None seen None seen /lpf   Bacteria, UA None seen None seen/Few  Urinalysis, Routine w reflex microscopic  Result Value Ref Range   Specific Gravity, UA 1.018 1.005 - 1.030   pH, UA 5.5 5.0 - 7.5   Color, UA Yellow Yellow   Appearance Ur Clear Clear   Leukocytes,UA 1+ (A) Negative   Protein,UA Negative Negative/Trace   Glucose, UA Negative Negative   Ketones, UA Negative Negative   RBC, UA Negative Negative   Bilirubin, UA Negative Negative   Urobilinogen, Ur 0.2 0.2 - 1.0 mg/dL   Nitrite, UA Negative Negative   Microscopic Examination See below:   POCT URINALYSIS DIP (CLINITEK)  Result Value Ref Range   Color, UA yellow yellow   Clarity, UA clear clear   Glucose, UA negative negative mg/dL   Bilirubin, UA negative negative   Ketones, POC UA negative negative mg/dL   Spec Grav, UA 8.984 8.989 - 1.025   Blood, UA small (A) negative   pH, UA 5.5 5.0 - 8.0   POC PROTEIN,UA negative negative, trace   Urobilinogen, UA 0.2 0.2 or 1.0 E.U./dL   Nitrite, UA Negative Negative   Leukocytes, UA Trace (A) Negative    The ASCVD Risk score (Arnett DK, et al., 2019) failed to calculate for the following reasons:   Cannot find a previous HDL lab   Cannot find a previous total cholesterol lab     Assessment & Plan:  1. Dysuria (Primary) UA with small leuks and blood. Will send for microscopic confirmation given recurrent UTI. Concern for possible interstitial cystitis given recurring symptoms. Will treat with Bactrim  as patient tolerated well and refer to Urology for likely further evaluation needed.  - POCT URINALYSIS DIP (CLINITEK) - Ambulatory referral to Urology - Urinalysis,  Routine w reflex microscopic  2. Anxiety and depression Controlled. Continue current regimen. Safety plan reviewed.   3. Chronic constipation Uncontrolled. Discussed Linzess and will start 145mcg daily. Safe use and possible side effects reviewed with patient. Will follow up in 3 months or sooner as needed pending improvement in bowel movements.  - linaclotide (LINZESS) 145 MCG CAPS capsule; Take 1 capsule (145 mcg total) by mouth daily.  Dispense: 30 capsule; Refill: 3  4. Hypothyroidism, unspecified type Stable. Declined TSH check today.    Meds ordered this encounter  Medications   linaclotide (LINZESS) 145 MCG CAPS capsule    Sig: Take 1 capsule (145 mcg total) by mouth daily.    Dispense:  30 capsule    Refill:  3    Supervising Provider:   DE PERU,  RAYMOND J [[id number]]   sulfamethoxazole -trimethoprim  (BACTRIM  DS) 800-160 MG tablet    Sig: Take 1 tablet by mouth 2 (two) times daily for 5 days.    Dispense:  10 tablet    Refill:  0    Supervising Provider:   DE PERU, RAYMOND J [[id number]]   Lab Orders         Microscopic Examination         Urinalysis, Routine w reflex microscopic         POCT URINALYSIS DIP (CLINITEK)    No images are attached to the encounter or orders placed in the encounter.  Return in about 3 months (around 10/10/2023) for Follow up Chronic Constipation, Hypothyroidism .    Patient to reach out to office if new, worrisome, or unresolved symptoms arise or if no improvement in patient's condition. Patient verbalized understanding and is agreeable to treatment plan. All questions answered to patient's satisfaction.    [name], [place]

## 2023-07-11 ENCOUNTER — Ambulatory Visit (HOSPITAL_BASED_OUTPATIENT_CLINIC_OR_DEPARTMENT_OTHER): Payer: Self-pay | Admitting: Family Medicine

## 2023-07-11 DIAGNOSIS — F419 Anxiety disorder, unspecified: Secondary | ICD-10-CM | POA: Insufficient documentation

## 2023-07-11 DIAGNOSIS — K5909 Other constipation: Secondary | ICD-10-CM | POA: Insufficient documentation

## 2023-07-11 LAB — URINALYSIS, ROUTINE W REFLEX MICROSCOPIC
Bilirubin, UA: NEGATIVE
Glucose, UA: NEGATIVE
Ketones, UA: NEGATIVE
Nitrite, UA: NEGATIVE
Protein,UA: NEGATIVE
RBC, UA: NEGATIVE
Specific Gravity, UA: 1.018 (ref 1.005–1.030)
Urobilinogen, Ur: 0.2 mg/dL (ref 0.2–1.0)
pH, UA: 5.5 (ref 5.0–7.5)

## 2023-07-11 LAB — MICROSCOPIC EXAMINATION
Bacteria, UA: NONE SEEN
Casts: NONE SEEN /LPF
RBC, Urine: NONE SEEN /HPF (ref 0–2)

## 2023-07-11 NOTE — Progress Notes (Signed)
 RBC negative with microscopic analysis. Given recurrent UTI, will proceed with current plan and referral to Urology for further evaluation.

## 2023-07-13 ENCOUNTER — Telehealth: Payer: Self-pay

## 2023-07-17 ENCOUNTER — Ambulatory Visit

## 2023-07-17 NOTE — Telephone Encounter (Signed)
 ordered

## 2023-07-20 ENCOUNTER — Telehealth: Payer: Self-pay

## 2023-07-21 ENCOUNTER — Telehealth: Payer: Self-pay | Admitting: Physician Assistant

## 2023-07-21 NOTE — Telephone Encounter (Signed)
 Ordered

## 2023-07-21 NOTE — Telephone Encounter (Signed)
 Please call pt about her injection.She called and said mychart sent message that appt was today. Please call pt Monday at [phone].

## 2023-07-24 ENCOUNTER — Telehealth: Payer: Self-pay | Admitting: Physician Assistant

## 2023-07-24 ENCOUNTER — Other Ambulatory Visit (HOSPITAL_BASED_OUTPATIENT_CLINIC_OR_DEPARTMENT_OTHER): Payer: Self-pay | Admitting: Family Medicine

## 2023-07-24 NOTE — Telephone Encounter (Signed)
 Please call pt about her injection.She called and said mychart sent message that appt was today. Please call pt Monday at [phone].

## 2023-07-25 ENCOUNTER — Telehealth: Payer: Self-pay

## 2023-07-25 NOTE — Telephone Encounter (Signed)
 Patient came in on 06/16/2023 and had her 1st evenity   injection and she scheduled her next appointment for 07-12-23 .  She did not show up for her appointment. I called patient this morning and LVM that she had a scheduled appointment on7-02-2023 that she missed and to feel free to call me back once she gets her message

## 2023-08-01 ENCOUNTER — Ambulatory Visit (INDEPENDENT_AMBULATORY_CARE_PROVIDER_SITE_OTHER): Admitting: Physician Assistant

## 2023-08-01 DIAGNOSIS — H2513 Age-related nuclear cataract, bilateral: Secondary | ICD-10-CM | POA: Diagnosis not present

## 2023-08-01 DIAGNOSIS — H524 Presbyopia: Secondary | ICD-10-CM | POA: Diagnosis not present

## 2023-08-01 DIAGNOSIS — M818 Other osteoporosis without current pathological fracture: Secondary | ICD-10-CM | POA: Diagnosis not present

## 2023-08-01 DIAGNOSIS — M8000XA Age-related osteoporosis with current pathological fracture, unspecified site, initial encounter for fracture: Secondary | ICD-10-CM

## 2023-08-01 DIAGNOSIS — H04123 Dry eye syndrome of bilateral lacrimal glands: Secondary | ICD-10-CM | POA: Diagnosis not present

## 2023-08-01 NOTE — Progress Notes (Unsigned)
 Pt here for her monthly Evenity  injection for osteoporosis. This will be here second injection. The patient received bilateral arm SQ injections of prefilled syringes. Tolerated well. Advised if any redness or increased pain at the site should occur to call the office and let us  know otherwise she can use a cold compress to the area for any minor discomfort. Pt voiced understanding and was advised to make her appt for next month at check out.   Onu#[id number] EXP 11/09/2025

## 2023-08-02 ENCOUNTER — Ambulatory Visit: Admitting: Physician Assistant

## 2023-08-02 NOTE — Progress Notes (Signed)
 Chief Complaint: Urinary burning  History of Present Illness:  [name] is a 68 y.o. female who is seen in consultation from Conroy, Thersia Bitters, FNP for evaluation of dysuria.  She states that she has been treated for 2 or 3 urinary tract infections for the past few months.  Typically, she just has mild burning, frequency and urgency.  Sometimes, just the dysuria and vaginal sensitivity and no lower urinary tract symptoms.  She is sexually active.  She has been on Macrodantin  suppression in the past, currently she is not on this.  She has somewhat of a slow stream.  Usually feels like she empties well.  No real issues with leakage.  No urgency.  No history of febrile UTIs or hematuria.  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  She had lumpectomy over 3 years ago.  She is on anastrozole  Past Medical History:  Past Medical History:  Diagnosis Date   AF (paroxysmal atrial fibrillation) (HCC)    Allergy    Anxiety    Arthritis    BCC (basal cell carcinoma) 03/11/2003   left distal lower leg ankle-tx dr helga   Biventricular cardiac pacemaker in situ    a. prior CRT-D in 2013 with downgrade to CRT-Pacemaker only in 11/2016.   Breast cancer (HCC) 12/12/2019   Cataract    Chronic systolic CHF (congestive heart failure) (HCC)    Complication of anesthesia    aspiration   Depression    Essential tremor    GERD (gastroesophageal reflux disease)    otc   Hypertension    Hypothyroidism    LBBB (left bundle branch block)    conduction disorder   Migraines    Mild aortic insufficiency    Neuromuscular disorder (HCC)    NICM (nonischemic cardiomyopathy) (HCC)    a. 2012: normal coronaries, EF 25-30%. b. improved to 55% 11/2016.   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   in situ- right lower leg-anterior (CX35FU)   Scoliosis     Past Surgical History:  Past Surgical History:  Procedure Laterality Date   ARTERY BIOPSY  12/29/2011   Procedure: MINOR BIOPSY TEMPORAL ARTERY;  Surgeon: Morene ONEIDA Olives, MD;  Location: [facility];  Service: General;  Laterality: Right;  Right temporal artery biopsy   BACK SURGERY  12/31/2009   for flat back syndrome; fused bottom of my back   BI-VENTRICULAR IMPLANTABLE CARDIOVERTER DEFIBRILLATOR Left 01/20/2011   Procedure: BI-VENTRICULAR IMPLANTABLE CARDIOVERTER DEFIBRILLATOR  (CRT-D);  Surgeon: Danelle LELON Birmingham, MD;  Location: [facility] CATH LAB;  Service: Cardiovascular;  Laterality: Left;   BIV ICD GENERATOR CHANGEOUT N/A 11/24/2016   Procedure: BIV ICD GENERATOR CHANGEOUT;  Surgeon: Birmingham Danelle LELON, MD;  Location: [facility] INVASIVE CV LAB;  Service: Cardiovascular;  Laterality: N/A;   BREAST LUMPECTOMY WITH RADIOACTIVE SEED LOCALIZATION Left 01/22/2020   Procedure: LEFT BREAST LUMPECTOMY WITH RADIOACTIVE SEED LOCALIZATION;  Surgeon: Belinda Cough, MD;  Location: [facility];  Service: General;  Laterality: Left;  LMA   BREAST SURGERY     CERVICAL ABLATION  03/29/2023   CHOLECYSTECTOMY  ~1996   FOOT NEUROMA SURGERY  ~ 2003   right   RE-EXCISION OF BREAST LUMPECTOMY Left 03/03/2020   Procedure: RE-EXCISION  MARGINS OF LEFT BREAST LUMPECTOMY;  Surgeon: Belinda Cough, MD;  Location: [facility];  Service: General;  Laterality: Left;   ROTATOR CUFF REPAIR  ~ 2009   left   SHOULDER ARTHROSCOPY W/ ROTATOR CUFF REPAIR Right 09/21/2011   SKIN CANCER EXCISION     nose, forehead, left leg   SPINE SURGERY     TEE WITHOUT CARDIOVERSION N/A 03/01/2017   Procedure: TRANSESOPHAGEAL ECHOCARDIOGRAM (TEE);  Surgeon: Raford Riggs, MD;  Location: [facility] ENDOSCOPY;  Service: Cardiovascular;  Laterality: N/A;   THYROID  LOBECTOMY  ~ 2006   right   TUBAL LIGATION  ~ 1983   VAGINAL  HYSTERECTOMY  ~ 2009   VESICOVAGINAL FISTULA CLOSURE W/ TAH      Allergies:  Allergies  Allergen Reactions   Other Nausea And Vomiting and Other (See Comments)    Most narcotic pain meds cause N/V and CHEST PAIN; needs an antiemetic to tolerate  Also, patient was diagnosed with Raynaud's disease and was told to NOT place IV(s) in either hand because of poor circulation   Iodine Other (See Comments)    Blisters   Tape Other (See Comments)    PATIENT'S SKIN IS VERY THIN; IT TEARS, BRUISES, AND BLEEDS VERY EASILY (PLEASE USE COBAN WRAP, IF POSSIBLE!!)   Codeine Other (See Comments)    Chest pain   Fentanyl  Other (See Comments)    Bad dreams   Morphine  And Codeine Nausea And Vomiting    Family History:  Family History  Problem Relation Age of Onset   Heart disease Mother    Heart failure Mother 39       CHF   Stroke Mother    Hypertension Mother    Varicose Veins Mother    Heart disease Father 59   Macular degeneration Father    Hearing loss Father    Vision loss Father    Heart attack Sister    Depression Sister    Heart disease Sister    Varicose Veins Sister    Atrial fibrillation Sister    Heart disease Sister    Arthritis Sister     Social History:  Social History   Tobacco Use   Smoking status: Never    Passive exposure: Never   Smokeless tobacco: Never  Vaping Use   Vaping status: Never Used  Substance Use Topics   Alcohol  use: No   Drug use: No    Review of symptoms:  Constitutional:  Negative for unexplained weight loss, night sweats, fever, chills ENT:  Negative for nose bleeds, sinus pain, painful swallowing CV:  Negative for chest pain, shortness of breath, exercise intolerance, palpitations, loss of consciousness Resp:  Negative for cough, wheezing, shortness of breath GI:  Negative for nausea, vomiting, diarrhea, bloody stools GU:  Positives noted in HPI; otherwise negative for gross hematuria, dysuria, urinary incontinence Neuro:  Negative  for seizures, poor balance, limb weakness, slurred speech Psych:  Negative for lack of energy, depression, anxiety Endocrine:  Negative for polydipsia, polyuria, symptoms of hypoglycemia (dizziness, hunger, sweating) Hematologic:  Negative for anemia, purpura, petechia, prolonged or excessive bleeding, use of anticoagulants  Allergic:  Negative for difficulty breathing or choking as a result of exposure to anything; no shellfish allergy; no allergic response (rash/itch) to materials, foods  Physical exam: There were no vitals taken for this visit. GENERAL APPEARANCE:  Well appearing, well developed, well nourished, NAD HEENT: Atraumatic, Normocephalic. NECK: Normal appearance LUNGS: Normal inspiratory and expiratory excursion HEART: Regular Rate EXTREMITIES: Moves all  extremities well.  Without clubbing, cyanosis, or edema. NEUROLOGIC:  Alert and oriented x 3, normal gait, CN II-XII grossly intact.  MENTAL STATUS:  Appropriate. SKIN:  Warm, dry and intact.    Results:  I have reviewed prior patient's records  I have reviewed referring/prior physicians records--PCP, oncology  I have reviewed urinalysis  I have reviewed prior urine cultures--past 2 urine cultures were negative    Assessment: - Recurrent urinary tract infections.  I think some of this is due to vaginal irritation rather than an infection.  However, these are quite bothersome to the patient  -Vaginal atrophic changes.  She does have a history of DCIS.  She is on anastrozole .   Plan: -For now, I have recommended cranberry capsules at night, increasing water intake some, taking a probiotic  -I will contact Dr. Cloretta, her oncologist about feasibility of using small amount of perivaginal Estrace cream.  If that is not possible, we will consider a vaginal wetting agent  - I will have her come back in 3 months to recheck

## 2023-08-07 ENCOUNTER — Ambulatory Visit: Admitting: Urology

## 2023-08-07 VITALS — BP 105/67 | HR 54 | Ht 67.0 in | Wt 157.0 lb

## 2023-08-07 DIAGNOSIS — N952 Postmenopausal atrophic vaginitis: Secondary | ICD-10-CM | POA: Diagnosis not present

## 2023-08-07 DIAGNOSIS — N390 Urinary tract infection, site not specified: Secondary | ICD-10-CM | POA: Diagnosis not present

## 2023-08-07 DIAGNOSIS — R300 Dysuria: Secondary | ICD-10-CM

## 2023-08-07 DIAGNOSIS — Z87440 Personal history of urinary (tract) infections: Secondary | ICD-10-CM | POA: Diagnosis not present

## 2023-08-07 LAB — URINALYSIS, ROUTINE W REFLEX MICROSCOPIC
Bilirubin, UA: NEGATIVE
Glucose, UA: NEGATIVE
Ketones, UA: NEGATIVE
Nitrite, UA: NEGATIVE
Protein,UA: NEGATIVE
Specific Gravity, UA: 1.02 (ref 1.005–1.030)
Urobilinogen, Ur: 0.2 mg/dL (ref 0.2–1.0)
pH, UA: 5.5 (ref 5.0–7.5)

## 2023-08-07 LAB — BLADDER SCAN AMB NON-IMAGING: Scan Result: 10

## 2023-08-07 LAB — MICROSCOPIC EXAMINATION

## 2023-08-08 DIAGNOSIS — M542 Cervicalgia: Secondary | ICD-10-CM | POA: Diagnosis not present

## 2023-08-10 ENCOUNTER — Encounter (HOSPITAL_BASED_OUTPATIENT_CLINIC_OR_DEPARTMENT_OTHER): Payer: Self-pay | Admitting: Family Medicine

## 2023-08-10 ENCOUNTER — Ambulatory Visit (HOSPITAL_BASED_OUTPATIENT_CLINIC_OR_DEPARTMENT_OTHER): Admitting: Family Medicine

## 2023-08-10 VITALS — BP 111/65 | HR 54 | Ht 67.0 in | Wt 160.1 lb

## 2023-08-10 DIAGNOSIS — G609 Hereditary and idiopathic neuropathy, unspecified: Secondary | ICD-10-CM | POA: Diagnosis not present

## 2023-08-10 DIAGNOSIS — L84 Corns and callosities: Secondary | ICD-10-CM

## 2023-08-10 MED ORDER — OMEPRAZOLE 40 MG PO CPDR: 40.0000 mg | DELAYED_RELEASE_CAPSULE | Freq: Every day | ORAL | 3 refills | Status: DC

## 2023-08-10 NOTE — Progress Notes (Signed)
 Acute Care Office Visit  Subjective:   [name] 1955-10-01 08/10/2023  Chief Complaint  Patient presents with   Foot Pain    Onset: 6-7 months ago Meds taken: Extra Strength Tylenol  and Gabapentin     HPI: Patient reports bilateral burning foot pain ongoing for 6-7 months with worsening diabetic neuropathy. She states she has a small sore on the left foot that also has difficulty with healing. She states she is wearing large thick tennis shoes to help support the feet. She states the pain is uncontrolled with Tylenol  and Gabapentin . She took two 600mg  Gabapentin  tablets yesterday with mild relief.   She states she has noticed a callus present to the left first toe. She states she has been trying OTC topical creams without relief. States the area will crack and become painful. Denies bleeding or drainage.   The following portions of the patient's history were reviewed and updated as appropriate: past medical history, past surgical history, family history, social history, allergies, medications, and problem list.   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   Other hammer toe(s) (acquired), left foot 07/10/2023   Restless legs syndrome 07/10/2023   Sacroiliitis, not elsewhere classified (HCC) 07/10/2023   Spasm 07/10/2023   Encounter for procedure for purposes other than remedying health state, unspecified 05/09/2023   Cellulitis  of right lower extremity 02/03/2023   Delayed healing of traumatic wound 02/03/2023   Biventricular ICD (implantable cardioverter-defibrillator) in place    DOE (dyspnea on exertion) 04/07/2021   Age-related osteoporosis without current pathological fracture 11/29/2020   Atrophy of vagina 11/29/2020   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   Breast CA (HCC) 02/18/2020   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   Rosacea 11/05/2019   Scar 11/05/2019   Biventricular cardiac pacemaker in situ 10/08/2018   Long term current use of anticoagulant therapy 10/23/2017   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   Trigeminal neuralgia 12/14/2012   Insomnia, persistent 11/03/2011   AICD (automatic cardioverter/defibrillator) present    Aortic regurgitation    Nonischemic cardiomyopathy (HCC) 01/21/2011   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/17/2010   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   Esophagitis, reflux 08/24/2010   Idiopathic peripheral neuropathy 08/24/2010   Chronic systolic CHF (congestive heart failure) (HCC) 02/24/2010   Paroxysmal atrial fibrillation (HCC) 02/23/2010   Hypothyroidism    History of migraine headaches    Left bundle branch block    Idiopathic scoliosis and kyphoscoliosis     Class: Chronic   Lumbar canal stenosis 12/25/2009   Menopause 04/15/2009   Anxiety state 07/24/2008   Avitaminosis D 03/21/2008   Atypical migraine 01/30/2008   Benign essential HTN 01/30/2008   Chronic migraine w/o aura w/o status migrainosus, not intractable 01/30/2008   Paroxysmal digital cyanosis 02/15/2007   Raynaud's phenomenon 02/15/2007   Past Medical History:  Diagnosis Date   AF (paroxysmal atrial fibrillation) (HCC)    Allergy    Anxiety  Arthritis    BCC (basal cell carcinoma) 03/11/2003   left distal lower leg ankle-tx dr helga   Biventricular cardiac pacemaker in situ    a. prior CRT-D in 2013 with downgrade to  CRT-Pacemaker only in 11/2016.   Breast cancer (HCC) 12/12/2019   Cataract    Chronic systolic CHF (congestive heart failure) (HCC)    Complication of anesthesia    aspiration   Depression    Essential tremor    GERD (gastroesophageal reflux disease)    otc   Hypertension    Hypothyroidism    LBBB (left bundle branch block)    conduction disorder   Migraines    Mild aortic insufficiency    Neuromuscular disorder (HCC)    NICM (nonischemic cardiomyopathy) (HCC)    a. 2012: normal coronaries, EF 25-30%. b. improved to 55% 11/2016.   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   in situ- right lower leg-anterior (CX35FU)   Scoliosis    Past Surgical History:  Procedure Laterality Date   ARTERY BIOPSY  12/29/2011   Procedure: MINOR BIOPSY TEMPORAL ARTERY;  Surgeon: Morene ONEIDA Olives, MD;  Location: [facility];  Service: General;  Laterality: Right;  Right temporal artery biopsy   BACK SURGERY  12/31/2009   for flat back syndrome; fused bottom of my back   BI-VENTRICULAR IMPLANTABLE CARDIOVERTER DEFIBRILLATOR Left 01/20/2011   Procedure: BI-VENTRICULAR IMPLANTABLE CARDIOVERTER DEFIBRILLATOR  (CRT-D);  Surgeon: Danelle LELON Birmingham, MD;  Location: [facility] CATH LAB;  Service: Cardiovascular;  Laterality: Left;   BIV ICD GENERATOR CHANGEOUT N/A 11/24/2016   Procedure: BIV ICD GENERATOR CHANGEOUT;  Surgeon: Birmingham Danelle LELON, MD;  Location: [facility] INVASIVE CV LAB;  Service: Cardiovascular;  Laterality: N/A;   BREAST LUMPECTOMY WITH RADIOACTIVE SEED LOCALIZATION Left 01/22/2020   Procedure: LEFT BREAST LUMPECTOMY WITH RADIOACTIVE SEED LOCALIZATION;  Surgeon: Belinda Cough, MD;  Location: [facility];  Service: General;  Laterality: Left;  LMA   BREAST SURGERY      CERVICAL ABLATION  03/29/2023   CHOLECYSTECTOMY  ~1996   FOOT NEUROMA SURGERY  ~ 2003   right   RE-EXCISION OF BREAST LUMPECTOMY Left 03/03/2020   Procedure: RE-EXCISION MARGINS OF LEFT BREAST LUMPECTOMY;  Surgeon: Belinda Cough, MD;  Location: [facility];  Service: General;  Laterality: Left;   ROTATOR CUFF REPAIR  ~ 2009   left   SHOULDER ARTHROSCOPY W/ ROTATOR CUFF REPAIR Right 09/21/2011   SKIN CANCER EXCISION     nose, forehead, left leg   SPINE SURGERY     TEE WITHOUT CARDIOVERSION N/A 03/01/2017   Procedure: TRANSESOPHAGEAL ECHOCARDIOGRAM (TEE);  Surgeon: Raford Riggs, MD;  Location: [facility] ENDOSCOPY;  Service: Cardiovascular;  Laterality: N/A;   THYROID  LOBECTOMY  ~ 2006   right   TUBAL LIGATION  ~ 1983   VAGINAL HYSTERECTOMY  ~ 2009   VESICOVAGINAL FISTULA CLOSURE W/ TAH     Family History  Problem Relation Age of Onset   Heart disease Mother    Heart failure Mother 69       CHF   Stroke Mother    Hypertension Mother    Varicose Veins Mother    Heart disease Father 36   Macular degeneration Father    Hearing loss Father  Vision loss Father    Heart attack Sister    Depression Sister    Heart disease Sister    Varicose Veins Sister    Atrial fibrillation Sister    Heart disease Sister    Arthritis Sister    Outpatient Medications Prior to Visit  Medication Sig Dispense Refill   acetaminophen  (TYLENOL ) 325 MG tablet Take 650 mg by mouth as needed.     albuterol  (VENTOLIN  HFA) 108 (90 Base) MCG/ACT inhaler Inhale 2 puffs into the lungs every 6 (six) hours as needed for wheezing or shortness of breath. 8 g 2   ALPRAZolam  (XANAX ) 0.5 MG tablet Take 0.5 mg by mouth 3 (three) times daily as needed for anxiety.     apixaban  (ELIQUIS ) 5 MG TABS tablet Take 1 tablet (5 mg total) by mouth 2 (two) times daily. 180 tablet 1   BOTOX 200 units injection Once every 4 months     buPROPion  (WELLBUTRIN  XL) 300 MG 24 hr tablet Take 1 tablet (300 mg total) by  mouth daily. 60 tablet 3   CALCIUM PO Take 2 tablets by mouth daily.     cetirizine (ZYRTEC) 10 MG tablet Take 10 mg by mouth daily.     eletriptan  (RELPAX ) 40 MG tablet Take 1 tablet (40 mg total) by mouth every 2 (two) hours as needed for migraine or headache. May repeat in 2 hours if headache persists or recurs. 10 tablet 2   famotidine  (PEPCID ) 20 MG tablet Take 20 mg by mouth daily as needed for heartburn or indigestion.     gabapentin  (NEURONTIN ) 600 MG tablet Take 600 mg by mouth 2 (two) times daily.     levothyroxine  (SYNTHROID ) 100 MCG tablet TAKE 1 TABLET BY MOUTH DAILY BEFORE BREAKFAST. 90 tablet 0   Melatonin 5 MG CAPS Take 10 mg by mouth at bedtime.     Menthol  (BIOFREEZE) 10 % AERO      nystatin -triamcinolone  ointment (MYCOLOG) Apply 1 application to external vaginal surface twice daily for 7 days. 30 g 2   Omega-3 Fatty Acids (FISH OIL) 1000 MG CAPS 1 capsule     polyethylene glycol (MIRALAX  / GLYCOLAX ) packet Take 17 g by mouth every evening.     propranolol  (INDERAL ) 60 MG tablet Take 1 tablet (60 mg total) by mouth daily. 90 tablet 3   spironolactone  (ALDACTONE ) 25 MG tablet Take 0.5 tablets (12.5 mg total) by mouth daily. 45 tablet 3   tretinoin  (RETIN-A ) 0.025 % cream Apply topically at bedtime. Apply to face on Monday and Thursday NIGHTS 45 g 4   Varenicline  Tartrate (TYRVAYA ) 0.03 MG/ACT SOLN Place into the nose.     linaclotide  (LINZESS ) 145 MCG CAPS capsule Take 1 capsule (145 mcg total) by mouth daily. 30 capsule 3   omeprazole  (PRILOSEC) 40 MG capsule Take 1 capsule (40 mg total) by mouth daily. 90 capsule 3   Facility-Administered Medications Prior to Visit  Medication Dose Route Frequency Provider Last Rate Last Admin   Romosozumab -aqqg (EVENITY ) 105 MG/1. injection 210 mg  210 mg Subcutaneous Q30 days Persons, Ronal Dragon, [place]       Allergies  Allergen Reactions   Other Nausea And Vomiting and Other (See Comments)    Most narcotic pain meds cause N/V and CHEST  PAIN; needs an antiemetic to tolerate  Also, patient was diagnosed with Raynaud's disease and was told to NOT place IV(s) in either hand because of poor circulation   Iodine Other (See Comments)    Blisters  Tape Other (See Comments)    PATIENT'S SKIN IS VERY THIN; IT TEARS, BRUISES, AND BLEEDS VERY EASILY (PLEASE USE COBAN WRAP, IF POSSIBLE!!)   Codeine Other (See Comments)    Chest pain   Fentanyl  Other (See Comments)    Bad dreams   Morphine  And Codeine Nausea And Vomiting     ROS: A complete ROS was performed with pertinent positives/negatives noted in the HPI. The remainder of the ROS are negative.    Objective:   Today's Vitals   08/10/23 1334  BP: 111/65  Pulse: (!) 54  SpO2: 98%  Weight: 160 lb 1.6 oz (72.6 kg)  Height: 5' 7 (1.702 m)  PainSc: 8   PainLoc: Foot    GENERAL: Well-appearing, in NAD. Well nourished.  SKIN: Pink, warm and dry. No rash, lesion, ulceration, or ecchymoses.  Head: Normocephalic. NECK: Trachea midline. Full ROM w/o pain or tenderness. RESPIRATORY: Chest wall symmetrical. Respirations even and non-labored. Breath sounds clear to auscultation bilaterally.  CARDIAC: S1, S2 present, regular rate and rhythm without murmur or gallops. Peripheral pulses 2+ bilaterally.  MSK: Muscle tone and strength appropriate for age.  EXTREMITIES: Without clubbing, cyanosis, or edema Full ROM.  Bilateral feet are tender to touch. No swelling. Small callus present to first toe of left foot.  NEUROLOGIC: No motor or sensory deficits. Steady, even gait. C2-C12 intact.  PSYCH/MENTAL STATUS: Alert, oriented x 3. Cooperative, appropriate mood and affect.    No results found for any visits on 08/10/23.    Assessment & Plan:  1. Idiopathic peripheral neuropathy (Primary) Uncontrolled. Increase Gabapentin  to 2 capsules in the AM and 2 capsules in the evening. If no impovmenet in 1-2 weeks, reach out to PCP. Discussed possibility of using Lyrica instead of  Gabapentin .   2. Pre-ulcerative corn or callous Recommend Flexeril  cream to calloused area nightly for 2-3 weeks and pumice stone after bathing. Recommend    Meds ordered this encounter  Medications   omeprazole  (PRILOSEC) 40 MG capsule    Sig: Take 1 capsule (40 mg total) by mouth daily.    Dispense:  30 capsule    Refill:  3    Supervising Provider:   DE PERU, RAYMOND J [[id number]]   Lab Orders  No laboratory test(s) ordered today    Return if symptoms worsen or fail to improve.    Patient to reach out to office if new, worrisome, or unresolved symptoms arise or if no improvement in patient's condition. Patient verbalized understanding and is agreeable to treatment plan. All questions answered to patient's satisfaction.    Thersia Schuyler Stark, [place]

## 2023-08-10 NOTE — Patient Instructions (Addendum)
 Gabapentin  600mg : Take 2 capsules in the morning and 2 capsules at night.   Flexitol Heel Balm- apply at night and use socks.    Good Nursing Facilities:  [facility]  [facility]  [facility]  [place]    Bad:  [facility]

## 2023-08-14 ENCOUNTER — Other Ambulatory Visit: Payer: Self-pay | Admitting: Urology

## 2023-08-14 ENCOUNTER — Other Ambulatory Visit (HOSPITAL_BASED_OUTPATIENT_CLINIC_OR_DEPARTMENT_OTHER): Payer: Self-pay | Admitting: Family Medicine

## 2023-08-14 DIAGNOSIS — F419 Anxiety disorder, unspecified: Secondary | ICD-10-CM

## 2023-08-14 MED ORDER — ESTRADIOL 0.1 MG/GM VA CREA
TOPICAL_CREAM | VAGINAL | 3 refills | Status: AC
Start: 2023-08-14 — End: ?

## 2023-08-15 ENCOUNTER — Telehealth: Payer: Self-pay

## 2023-08-15 NOTE — Telephone Encounter (Signed)
 Dana Senior, MD  Koleen Cree C, LPN Please call pt--Dr Cloretta said it would be fine for her to use estrogen cream. I have sent scrip in for her   Spoke with pt in reference to estrace  cream. Pt voiced understanding.

## 2023-08-22 ENCOUNTER — Ambulatory Visit: Payer: Medicare PPO

## 2023-08-22 DIAGNOSIS — H10413 Chronic giant papillary conjunctivitis, bilateral: Secondary | ICD-10-CM | POA: Diagnosis not present

## 2023-08-22 DIAGNOSIS — I428 Other cardiomyopathies: Secondary | ICD-10-CM | POA: Diagnosis not present

## 2023-08-22 DIAGNOSIS — H04123 Dry eye syndrome of bilateral lacrimal glands: Secondary | ICD-10-CM | POA: Diagnosis not present

## 2023-08-23 LAB — CUP PACEART REMOTE DEVICE CHECK
Battery Remaining Longevity: 37 mo
Battery Voltage: 2.91 V
Brady Statistic AP VP Percent: 10.96 %
Brady Statistic AP VS Percent: 0.26 %
Brady Statistic AS VP Percent: 87.11 %
Brady Statistic AS VS Percent: 1.66 %
Brady Statistic RA Percent Paced: 11.22 %
Brady Statistic RV Percent Paced: 5.1 %
Date Time Interrogation Session: 20250811200909
Implantable Lead Connection Status: 753985
Implantable Lead Connection Status: 753985
Implantable Lead Connection Status: 753985
Implantable Lead Implant Date: 20130110
Implantable Lead Implant Date: 20130110
Implantable Lead Implant Date: 20130110
Implantable Lead Location: 753858
Implantable Lead Location: 753859
Implantable Lead Location: 753860
Implantable Lead Model: 4196
Implantable Lead Model: 5076
Implantable Lead Model: 6935
Implantable Pulse Generator Implant Date: 20181115
Lead Channel Impedance Value: 1197 Ohm
Lead Channel Impedance Value: 323 Ohm
Lead Channel Impedance Value: 380 Ohm
Lead Channel Impedance Value: 608 Ohm
Lead Channel Impedance Value: 703 Ohm
Lead Channel Impedance Value: 741 Ohm
Lead Channel Impedance Value: 798 Ohm
Lead Channel Impedance Value: 836 Ohm
Lead Channel Impedance Value: 855 Ohm
Lead Channel Pacing Threshold Amplitude: 0.75 V
Lead Channel Pacing Threshold Amplitude: 1.875 V
Lead Channel Pacing Threshold Amplitude: 2.5 V
Lead Channel Pacing Threshold Pulse Width: 0.4 ms
Lead Channel Pacing Threshold Pulse Width: 0.4 ms
Lead Channel Pacing Threshold Pulse Width: 0.8 ms
Lead Channel Sensing Intrinsic Amplitude: 0.875 mV
Lead Channel Sensing Intrinsic Amplitude: 0.875 mV
Lead Channel Sensing Intrinsic Amplitude: 14 mV
Lead Channel Sensing Intrinsic Amplitude: 14 mV
Lead Channel Setting Pacing Amplitude: 2 V
Lead Channel Setting Pacing Amplitude: 2.5 V
Lead Channel Setting Pacing Amplitude: 3.5 V
Lead Channel Setting Pacing Pulse Width: 0.8 ms
Lead Channel Setting Pacing Pulse Width: 1 ms
Lead Channel Setting Sensing Sensitivity: 0.9 mV
Zone Setting Status: 755011
Zone Setting Status: 755011

## 2023-08-24 ENCOUNTER — Ambulatory Visit: Payer: Self-pay | Admitting: Internal Medicine

## 2023-08-24 DIAGNOSIS — M818 Other osteoporosis without current pathological fracture: Secondary | ICD-10-CM | POA: Diagnosis not present

## 2023-08-27 ENCOUNTER — Other Ambulatory Visit: Payer: Self-pay | Admitting: Cardiology

## 2023-08-27 DIAGNOSIS — I480 Paroxysmal atrial fibrillation: Secondary | ICD-10-CM

## 2023-08-27 IMAGING — DX DG CHEST 2V
2 series · 2 of 2 positions shown · non-contrast
Comparison: 11/06/2020

CLINICAL DATA: Cough, shortness of breath

EXAM:
CHEST - 2 VIEW

[chest pa]
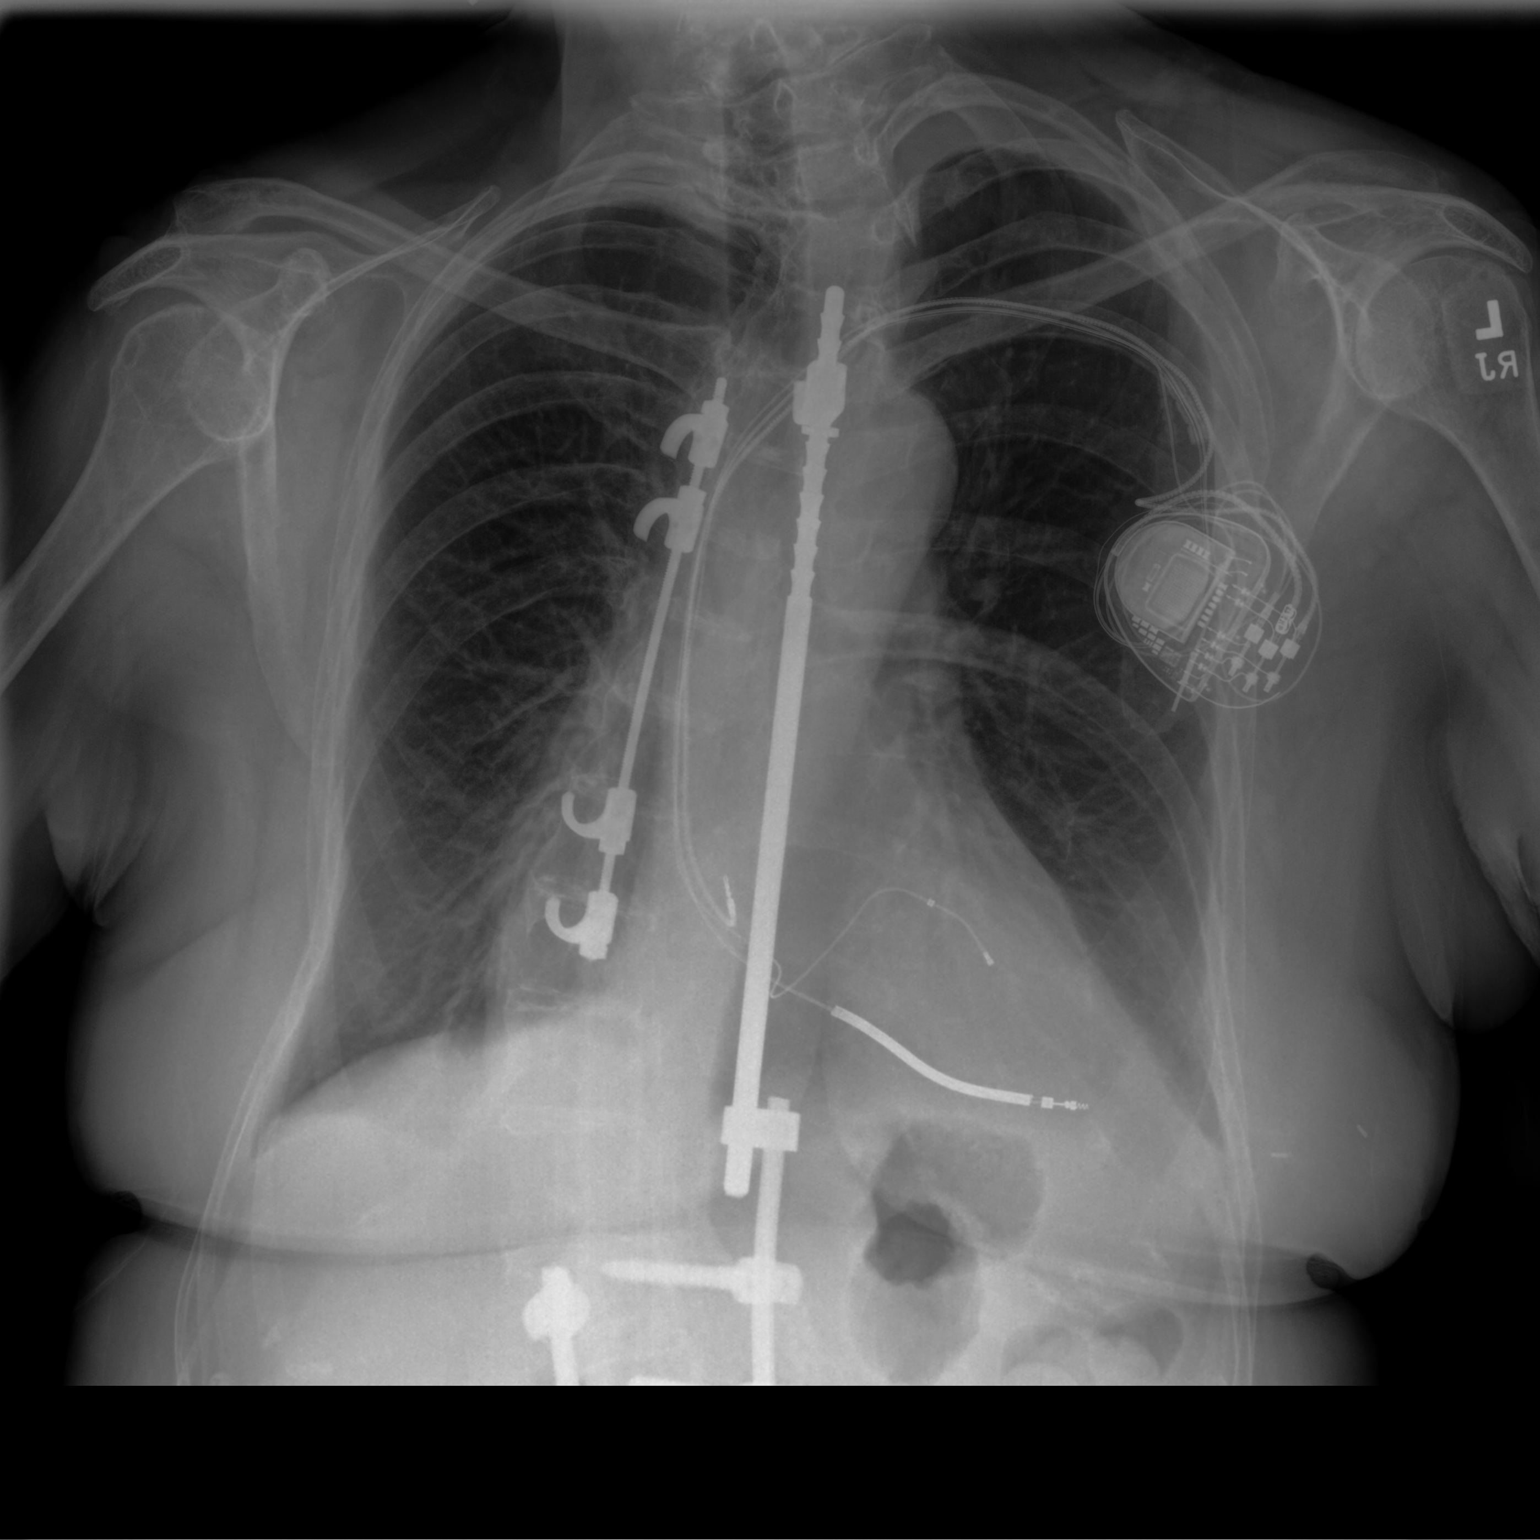

[chest lat]
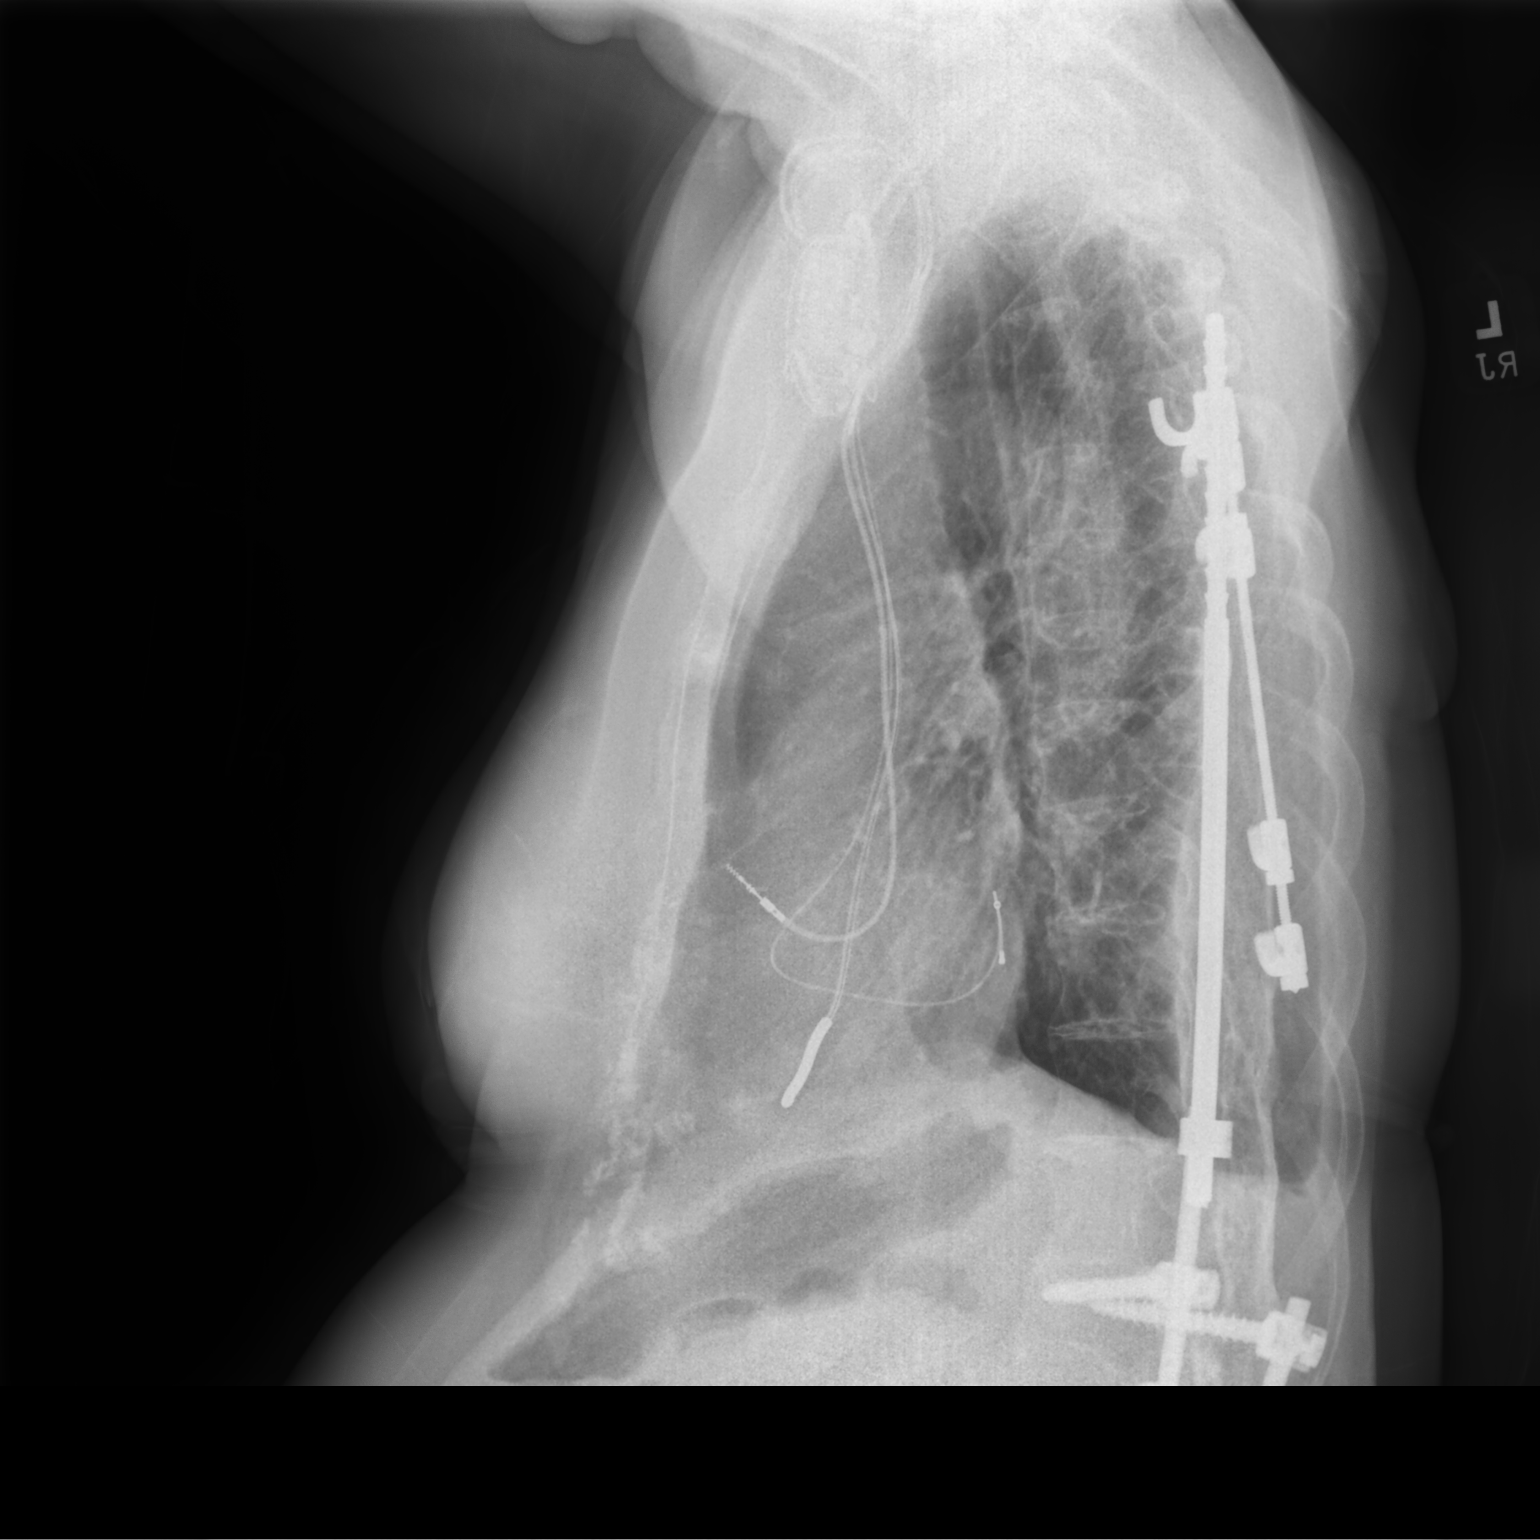

[2 of 2 positions shown; findings below may reference images not displayed]

FINDINGS: Transverse diameter of heart is increased. Pacemaker/defibrillator
battery is seen in the left infraclavicular region with
biventricular pacer leads. There are no new infiltrates or signs of
pulmonary edema. There is no significant pleural effusion or
pneumothorax. Dextroscoliosis is seen in the thoracic spine.
Harrington rods are seen in the thoracic and visualized upper lumbar
spine.
IMPRESSION: There are no new infiltrates or signs of pulmonary edema.

## 2023-08-28 ENCOUNTER — Telehealth: Payer: Self-pay | Admitting: Oncology

## 2023-08-28 NOTE — Telephone Encounter (Signed)
 Prescription refill request for Eliquis  received. Indication:afib Last office visit:11/24 Scr:1.05  2/25 Age: 68 Weight:72.6  kg  Prescription refilled

## 2023-08-29 ENCOUNTER — Ambulatory Visit (INDEPENDENT_AMBULATORY_CARE_PROVIDER_SITE_OTHER): Admitting: Physician Assistant

## 2023-08-29 ENCOUNTER — Ambulatory Visit (HOSPITAL_BASED_OUTPATIENT_CLINIC_OR_DEPARTMENT_OTHER): Admitting: Family Medicine

## 2023-08-29 DIAGNOSIS — M8000XA Age-related osteoporosis with current pathological fracture, unspecified site, initial encounter for fracture: Secondary | ICD-10-CM

## 2023-08-29 MED ORDER — ROMOSOZUMAB-AQQG 105 MG/1.17ML ~~LOC~~ SOSY: 210.0000 mg | PREFILLED_SYRINGE | SUBCUTANEOUS | Status: AC

## 2023-08-29 NOTE — Progress Notes (Signed)
 Nurse visit

## 2023-08-30 ENCOUNTER — Other Ambulatory Visit (HOSPITAL_BASED_OUTPATIENT_CLINIC_OR_DEPARTMENT_OTHER): Payer: Self-pay | Admitting: Family Medicine

## 2023-08-30 DIAGNOSIS — G43709 Chronic migraine without aura, not intractable, without status migrainosus: Secondary | ICD-10-CM

## 2023-08-31 ENCOUNTER — Telehealth: Payer: Self-pay | Admitting: *Deleted

## 2023-08-31 NOTE — Telephone Encounter (Signed)
 VM from patient that she has been started on SQ Evenity  every 30 days for osteoporosis. Per Dr. Cloretta: D/C Zometa  here. Unable to reach patient, so mychart message sent.

## 2023-09-03 ENCOUNTER — Other Ambulatory Visit: Payer: Self-pay | Admitting: Oncology

## 2023-09-03 DIAGNOSIS — D0512 Intraductal carcinoma in situ of left breast: Secondary | ICD-10-CM

## 2023-09-04 ENCOUNTER — Encounter: Payer: Self-pay | Admitting: Nurse Practitioner

## 2023-09-04 ENCOUNTER — Other Ambulatory Visit: Payer: Medicare PPO

## 2023-09-04 ENCOUNTER — Ambulatory Visit: Payer: Medicare PPO

## 2023-09-05 ENCOUNTER — Other Ambulatory Visit: Payer: Self-pay

## 2023-09-05 ENCOUNTER — Emergency Department (HOSPITAL_BASED_OUTPATIENT_CLINIC_OR_DEPARTMENT_OTHER)
Admission: EM | Admit: 2023-09-05 | Discharge: 2023-09-05 | Disposition: A | Discharge status: Home / Self Care | Attending: Emergency Medicine | Admitting: Emergency Medicine

## 2023-09-05 ENCOUNTER — Encounter (HOSPITAL_BASED_OUTPATIENT_CLINIC_OR_DEPARTMENT_OTHER): Payer: Self-pay | Admitting: Emergency Medicine

## 2023-09-05 DIAGNOSIS — E039 Hypothyroidism, unspecified: Secondary | ICD-10-CM | POA: Diagnosis not present

## 2023-09-05 DIAGNOSIS — Z7901 Long term (current) use of anticoagulants: Secondary | ICD-10-CM | POA: Diagnosis not present

## 2023-09-05 DIAGNOSIS — S8992XA Unspecified injury of left lower leg, initial encounter: Secondary | ICD-10-CM | POA: Diagnosis present

## 2023-09-05 DIAGNOSIS — I509 Heart failure, unspecified: Secondary | ICD-10-CM | POA: Insufficient documentation

## 2023-09-05 DIAGNOSIS — R001 Bradycardia, unspecified: Secondary | ICD-10-CM | POA: Insufficient documentation

## 2023-09-05 DIAGNOSIS — S81812A Laceration without foreign body, left lower leg, initial encounter: Secondary | ICD-10-CM | POA: Diagnosis not present

## 2023-09-05 DIAGNOSIS — W228XXA Striking against or struck by other objects, initial encounter: Secondary | ICD-10-CM | POA: Insufficient documentation

## 2023-09-05 MED ORDER — ACETAMINOPHEN 500 MG PO TABS
500.0000 mg | ORAL_TABLET | Freq: Once | ORAL | Status: AC
  Administered 2023-09-05: 500 mg via ORAL
  Filled 2023-09-05: qty 1

## 2023-09-05 MED ORDER — HYDROXYZINE HCL 10 MG PO TABS
10.0000 mg | ORAL_TABLET | Freq: Once | ORAL | Status: AC
  Administered 2023-09-05: 10 mg via ORAL
  Filled 2023-09-05: qty 1

## 2023-09-05 MED ORDER — LIDOCAINE HCL (PF) 1 % IJ SOLN
5.0000 mL | Freq: Once | INTRAMUSCULAR | Status: AC
  Administered 2023-09-05: 5 mL
  Filled 2023-09-05: qty 5

## 2023-09-05 MED ORDER — ACETAMINOPHEN 325 MG PO TABS
650.0000 mg | ORAL_TABLET | Freq: Once | ORAL | Status: DC
  Filled 2023-09-05: qty 2

## 2023-09-05 MED ORDER — ONDANSETRON 4 MG PO TBDP
4.0000 mg | ORAL_TABLET | Freq: Once | ORAL | Status: AC
  Administered 2023-09-05: 4 mg via ORAL
  Filled 2023-09-05: qty 1

## 2023-09-05 MED ORDER — LIDOCAINE-EPINEPHRINE-TETRACAINE (LET) TOPICAL GEL
3.0000 mL | Freq: Once | TOPICAL | Status: AC
  Administered 2023-09-05: 3 mL via TOPICAL
  Filled 2023-09-05: qty 3

## 2023-09-05 NOTE — ED Provider Notes (Signed)
 [place] EMERGENCY DEPARTMENT AT [facility] Provider Note   CSN: [id number] Arrival date & time: 09/05/23  1044     History Chief Complaint  Patient presents with  . Laceration     Laceration  HPI: [name] is a 68 y.o. female with history perinent for A-fib on Eliquis , nonischemic cardiomyopathy, CHF, hypothyroidism, peripheral neuropathy who presents complaining of wound to the left lower extremity. Patient arrived via POV accompanied by husband.  History provided by patient and spouse.  No interpreter required during this encounter.  Patient reports that approximately 20 minutes prior to arrival she was attempting to get out of the car when she hit her left lower extremity on the corner of the car door.  Reports that she did not have associated fall or trauma to other parts of her body such as her head.  Reports that she was able to ambulate after this incident, and has not noted pain to any other portion of her body.  She reports that given she is on Eliquis  she wanted to have the wound evaluated and repaired in the emergency department.  Notes that her tetanus was most recently updated in January 2025.  Patient reports that since looking at the wound she is feeling anxious and slightly nauseous, and request medication for both anxiety and nausea.  Patient's recorded medical, surgical, social, medication list and allergies were reviewed in the Snapshot window as part of the initial history.   Prior to Admission medications   Medication Sig Start Date End Date Taking? Authorizing Provider  estradiol  (ESTRACE ) 0.1 MG/GM vaginal cream Apply a pea-sized amount to vaginal opening using index finger 2-3 nights/week 08/14/23   Matilda Senior, MD  fluorometholone (FML) 0.1 % ophthalmic suspension Place 1 drop into both eyes 3 (three) times daily. 08/22/23  Yes [provider]  methocarbamol (ROBAXIN) 500 MG tablet Take 500 mg by mouth 3 (three) times daily as  needed. 08/08/23  Yes [provider]  acetaminophen  (TYLENOL ) 325 MG tablet Take 650 mg by mouth as needed.    [provider]  albuterol  (VENTOLIN  HFA) 108 (90 Base) MCG/ACT inhaler Inhale 2 puffs into the lungs every 6 (six) hours as needed for wheezing or shortness of breath. 12/27/22   Butler, Kristina, FNP  ALPRAZolam  (XANAX ) 0.5 MG tablet Take 0.5 mg by mouth 3 (three) times daily as needed for anxiety.    [provider]  anastrozole  (ARIMIDEX ) 1 MG tablet Take 1 tablet (1 mg total) by mouth daily. 09/04/23   Cloretta Arley NOVAK, MD  apixaban  (ELIQUIS ) 5 MG TABS tablet TAKE 1 TABLET TWICE DAILY 08/28/23   Lonni Slain, MD  BOTOX 200 units injection Once every 4 months 11/08/22   [provider]  buPROPion  (WELLBUTRIN  XL) 300 MG 24 hr tablet TAKE 1 TABLET BY MOUTH EVERY DAY 08/14/23   Caudle, Alexis Olivia, FNP  CALCIUM PO Take 2 tablets by mouth daily.    [provider]  cetirizine (ZYRTEC) 10 MG tablet Take 10 mg by mouth daily.    [provider]  eletriptan  (RELPAX ) 40 MG tablet Take 1 tablet (40 mg total) by mouth every 2 (two) hours as needed for migraine or headache. May repeat in 2 hours if headache persists or recurs. 12/05/22   Towana Small, FNP  famotidine  (PEPCID ) 20 MG tablet Take 20 mg by mouth daily as needed for heartburn or indigestion.    [provider]  gabapentin  (NEURONTIN ) 600 MG tablet Take 600 mg  by mouth 2 (two) times daily.    [provider]  levothyroxine  (SYNTHROID ) 100 MCG tablet TAKE 1 TABLET BY MOUTH DAILY BEFORE BREAKFAST. 07/24/23   Caudle, Thersia Bitters, FNP  Melatonin 5 MG CAPS Take 10 mg by mouth at bedtime.    [provider]  Menthol  (BIOFREEZE) 10 % AERO     [provider]  nystatin -triamcinolone  ointment (MYCOLOG) Apply 1 application to external vaginal surface twice daily for 7 days. 02/03/23   Caudle, Thersia Bitters, FNP  Omega-3 Fatty Acids (FISH OIL)  1000 MG CAPS 1 capsule    [provider]  omeprazole  (PRILOSEC) 40 MG capsule Take 1 capsule (40 mg total) by mouth daily. 08/10/23   Caudle, Thersia Bitters, FNP  polyethylene glycol (MIRALAX  / GLYCOLAX ) packet Take 17 g by mouth every evening.    [provider]  propranolol  (INDERAL ) 60 MG tablet Take 1 tablet (60 mg total) by mouth daily. 11/10/20   Leverne Charlies Helling, PA-C  spironolactone  (ALDACTONE ) 25 MG tablet Take 0.5 tablets (12.5 mg total) by mouth daily. 02/23/23   Waddell Danelle ORN, MD  tretinoin  (RETIN-A ) 0.025 % cream Apply topically at bedtime. Apply to face on Monday and Thursday NIGHTS 05/09/23   Paci, Karina M, MD  Varenicline  Tartrate (TYRVAYA ) 0.03 MG/ACT SOLN Place into the nose.    [provider]     Allergies: Other, Iodine, Tape, Codeine, Fentanyl , and Morphine  and codeine   Review of Systems   ROS as per HPI  Physical Exam Updated Vital Signs BP (!) 111/47   Pulse (!) 57   Temp 97.6 F (36.4 C) (Oral)   Resp 17   Ht 5' 7.5 (1.715 m)   Wt 73.5 kg   SpO2 97%   BMI 25.00 kg/m  Physical Exam Vitals and nursing note reviewed.  Constitutional:      General: She is not in acute distress.    Appearance: She is well-developed.  HENT:     Head: Normocephalic and atraumatic.  Eyes:     Conjunctiva/sclera: Conjunctivae normal.  Cardiovascular:     Rate and Rhythm: Regular rhythm. Bradycardia present.     Heart sounds: No murmur heard. Pulmonary:     Effort: Pulmonary effort is normal. No respiratory distress.     Breath sounds: Normal breath sounds.  Abdominal:     Palpations: Abdomen is soft.     Tenderness: There is no abdominal tenderness.  Musculoskeletal:        General: Signs of injury (Skin tear/avulsion to the anterior left lower leg with triangular-shaped wound.  Avulsed skin flap is anchored on the medial aspect, and central portion of skin flap with purple discoloration concerning for devitalization of tissue) present.      Cervical back: Neck supple.     Comments: Bilateral upper extremities and right lower extremities atraumatic.  Left lower extremity without bony tenderness of the tibia, medial malleolus, lateral malleolus, base of the fifth metatarsal, navicular  Skin:    General: Skin is warm and dry.     Capillary Refill: Capillary refill takes less than 2 seconds.  Neurological:     Mental Status: She is alert.  Psychiatric:        Mood and Affect: Mood normal.     ED Course/ Medical Decision Making/ A&P    Procedures .Laceration Repair  Date/Time: 09/05/2023 7:57 PM  Performed by: Rogelia Jerilynn RAMAN, MD Authorized by: Rogelia Jerilynn RAMAN, MD   Consent:    Consent obtained:  Verbal   Consent given by:  Patient   Risks, benefits, and alternatives were discussed: yes     Risks discussed:  Infection, need for additional repair, poor wound healing, poor cosmetic result, pain, retained foreign body and vascular damage   Alternatives discussed:  No treatment, delayed treatment and observation Universal protocol:    Procedure explained and questions answered to patient or proxy's satisfaction: yes     Immediately prior to procedure, a time out was called: yes     Patient identity confirmed:  Verbally with patient Anesthesia:    Anesthesia method:  Topical application and local infiltration   Topical anesthetic:  LET   Local anesthetic:  Lidocaine  1% w/o epi Laceration details:    Location:  Leg   Leg location:  L lower leg   Length (cm):  7 (7cm total (3cm on lateral portion, 4cm on superior portion)   Depth (mm):  3 Pre-procedure details:    Preparation:  Patient was prepped and draped in usual sterile fashion Exploration:    Limited defect created (wound extended): no     Hemostasis achieved with:  LET and direct pressure   Imaging outcome: foreign body not noted     Wound exploration: wound explored through full range of motion and entire depth of wound visualized     Wound extent:  areolar tissue violated     Wound extent: no foreign body     Contaminated: no   Treatment:    Area cleansed with:  Saline   Amount of cleaning:  Standard   Irrigation solution:  Sterile saline   Irrigation volume:  500cc   Irrigation method:  Pressure wash   Visualized foreign bodies/material removed: no     Debridement:  None   Undermining:  None   Scar revision: no   Skin repair:    Repair method:  Sutures   Suture size:  3-0 and 4-0   Suture material:  Prolene   Suture technique:  Simple interrupted and horizontal mattress   Number of sutures:  8 (3 horizontal mattress, 3 simple interrupted) Approximation:    Approximation:  Loose Repair type:    Repair type:  Intermediate Post-procedure details:    Dressing:  Non-adherent dressing   Procedure completion:  Tolerated well, no immediate complications Comments:     Patient with avulsion of thin skin with discoloration concerning for devitalization.  Medial aspect of wound with acceptable approximation, however superior aspect of the wound with significant tension.  Patient unfortunately with thin skin, thus when initial sutures placed patient had tearing through the skin.  Wound edges were reinforced with Dermabond and sutures were placed through Dermabond and skin to reduce skin tearing, however given tension on wound edges were not fully approximated.  Given avulsed portion of tissue is primarily skin and very small amount of subcutaneous tissue, unable to perform layered closure.    Medications Ordered in ED Medications  hydrOXYzine  (ATARAX ) tablet 10 mg (10 mg Oral Given 09/05/23 1127)  lidocaine -EPINEPHrine -tetracaine  (LET) topical gel (3 mLs Topical Given 09/05/23 1130)  acetaminophen  (TYLENOL ) tablet 500 mg (500 mg Oral Given 09/05/23 1151)  ondansetron  (ZOFRAN -ODT) disintegrating tablet 4 mg (4 mg Oral Given 09/05/23 1151)  lidocaine  (PF) (XYLOCAINE ) 1 % injection 5 mL (5 mLs Other Given 09/05/23 1221)    Medical Decision  Making:   [name] is a 68 y.o. female who presents for left lower extremity wound as per above.  Physical exam is pertinent for avulsion of  skin to the anterior left lower leg.   The differential includes but is not limited to laceration, tissue avulsion, blood loss anemia.  Independent historian: Spouse  External data reviewed: Notes: Did review vitals and multiple prior notes, patient typically with systolic blood pressure 100s to 110s, and diastolic blood pressures 50s to 60s.  Typical in 50s.  Labs: Not indicated  Radiology: Not indicated  EKG/Medicine tests: Not indicated EKG Interpretation:                  Interventions: Atarax , Zofran , Tylenol , let gel, lidocaine   See the EMR for full details regarding lab and imaging results.  Patient presents for wound to left lower extremity.  Patient reports that wound was caused by a another object hitting her leg.  Given injury was not sustained during a fall, object did not hit any other part of her body, she has continued to be ambulatory, has no bony tenderness, and complains of no pain elsewhere in her body, do not feel that patient requires imaging at this time.  Additionally do not feel that patient requires imaging of the left lower extremity to evaluate for foreign body given full wound base is easily visible on direct examination.  Similarly patient presented just after injury, and had a small amount of bloodstained tissue pressed to the wound.  Patient reports that she did not use any other objects to control bleeding, has overall small amount of blood on tissue, and does not have appreciable hematoma on exam, do not feel that patient would benefit from labs such as hemoglobin at this time.  Additionally, patient does have low normotension as well as bradycardia on exam.  Patient reports that vitals are at her baseline, on review of prior vitals in previous notes, vitals are consistent.  Given patient it is A&O x 4, and evidence  that this is baseline, do not feel that patient requires intervention on these vitals.  Given patient's report of anxiety and nausea after looking at her wound, was administered low-dose Atarax  as well as Zofran  with resolution of the symptoms.  Wound repaired as per procedure note above.  Prior to initiation of repair, discussed evidence of devitalized tissue, and that the devitalized tissue that was avulsed may not heal and may cause a larger wound that would have to heal by secondary intention.  Patient expressed understanding of these risks.  After initiation of wound repair, repair further complication by tearing of patient's thin skin and inability to perform 2 layer closure due to only a thin portion of skin being avulsed, therefore wound was closed loosely with overall poor approximation.  Discussed with patient that this further risks of poor wound healing, devitalization of the skin requiring the wound to have to heal via secondary intention, and likelihood of significant scarring.  Patient expressed understanding.  Educated on wound care and recommended follow-up with PCP for suture removal in 10 to 14 days  Presentation is most consistent with acute uncomplicated illness  Discussion of management or test interpretations with external provider(s): Not indicated  Risk Drugs:OTC drugs and Prescription drug management  Disposition: DISCHARGE: I believe that the patient is safe for discharge home with outpatient follow-up. Patient was informed of all pertinent physical exam, laboratory, and imaging findings including likelihood of poor wound healing and significant scarring to leg wound. Patient's suspected etiology of their symptom presentation was discussed with the patient and all questions were answered. We discussed following up with PCP. I provided thorough ED return  precautions. The patient feels safe and comfortable with this plan.  MDM generated using voice dictation software and may  contain dictation errors.  Please contact me for any clarification or with any questions.  Clinical Impression:  1. Laceration of left lower leg, initial encounter      Discharge   Final Clinical Impression(s) / ED Diagnoses Final diagnoses:  Laceration of left lower leg, initial encounter    Rx / DC Orders ED Discharge Orders     None         Rogelia Jerilynn RAMAN, MD 09/05/23 2014

## 2023-09-05 NOTE — ED Notes (Signed)
 ED Provider at bedside.

## 2023-09-05 NOTE — ED Notes (Signed)
 Pt states low blood pressure reading is her normal. Denies dizziness. NAD.

## 2023-09-05 NOTE — Discharge Instructions (Signed)
 [name]  Thank you for allowing us  to take care of you today.  You came to the Emergency Department today because you cut your leg on the car door while you are getting out of the car.  Unfortunately the door created a jagged wound and that you have very thin skin on your lower leg.  Prior to repairing your wound, you did have some purpleish discoloration on the triangle of skin that had been torn away, indicating poor blood flow, which often does not heal as well.  Additionally when we tried to stitch your wound, your skin was thin such that it was tearing rather than holding the stitches.  We put some skin glue on the edges of your wound to help anchor the stitches in, but we were unable to completely bring the wound edges together especially on the portion of the wound that was oriented horizontally.  The sections of the wound where the skin is not fully together will have to heal from the bottom up rather than side-to-side.  It is possible that the skin that was in the center of the triangle may die due to poor blood flow, and this area will also heal from the bottom up if that occurs.  We recommend keeping the area clean and dry until the wound is fully healed, please do not submerge it under water for example in the swimming pool, bathtub, or in the ocean until the wound is fully healed.  We did place 8 stitches in the wound to help approximate the wound as best as we could.  You should have the stitches removed in approximately 10 to 14 days.  You can go to urgent care, back to the emergency department, or go to your primary care to have the stitches removed.  To-Do: 1. Please follow-up with your primary doctor within 2 days / as soon as possible.    Please return to the Emergency Department or call 911 if you experience have worsening of your symptoms, or do not get better, severe redness, swelling, pain, drainage from the wound, chest pain, shortness of breath, severe or significantly  worsening pain, high fever, severe confusion, pass out or have any reason to think that you need emergency medical care.   We hope you feel better soon.   Mitzie Later, MD Department of Emergency Medicine [facility]

## 2023-09-05 NOTE — ED Triage Notes (Signed)
 Pt caox4, ambulatory c/o lac to L lower leg from the corner of the car door while shutting the door. Last Tdap 01/2023.

## 2023-09-12 ENCOUNTER — Other Ambulatory Visit (HOSPITAL_BASED_OUTPATIENT_CLINIC_OR_DEPARTMENT_OTHER): Payer: Self-pay | Admitting: Family Medicine

## 2023-09-12 DIAGNOSIS — G43709 Chronic migraine without aura, not intractable, without status migrainosus: Secondary | ICD-10-CM

## 2023-09-12 MED ORDER — ELETRIPTAN HYDROBROMIDE 40 MG PO TABS: 40.0000 mg | ORAL_TABLET | ORAL | 2 refills | Status: DC | PRN

## 2023-09-12 NOTE — Telephone Encounter (Signed)
 Copied from CRM [id number]. Topic: Clinical - Medication Refill >> Sep 12, 2023  3:12 PM Everette C wrote: Medication: eletriptan  (RELPAX ) 40 MG tablet  Has the patient contacted their pharmacy? Yes (Agent: If no, request that the patient contact the pharmacy for the refill. If patient does not wish to contact the pharmacy document the reason why and proceed with request.) (Agent: If yes, when and what did the pharmacy advise?)  This is the patient's preferred pharmacy:  [id number] - [place], [place] - [address] [place] [address] Phone: [phone] Fax: [phone]  Is this the correct pharmacy for this prescription? Yes If no, delete pharmacy and type the correct one.   Has the prescription been filled recently? Yes  Is the patient out of the medication? Yes  Has the patient been seen for an appointment in the last year OR does the patient have an upcoming appointment? Yes  Can we respond through MyChart? No  Agent: Please be advised that Rx refills may take up to 3 business days. We ask that you follow-up with your pharmacy.

## 2023-09-16 ENCOUNTER — Emergency Department (HOSPITAL_BASED_OUTPATIENT_CLINIC_OR_DEPARTMENT_OTHER)
Admission: EM | Admit: 2023-09-16 | Discharge: 2023-09-17 | Disposition: A | Discharge status: Home / Self Care | Attending: Emergency Medicine | Admitting: Emergency Medicine

## 2023-09-16 ENCOUNTER — Encounter (HOSPITAL_BASED_OUTPATIENT_CLINIC_OR_DEPARTMENT_OTHER): Payer: Self-pay

## 2023-09-16 ENCOUNTER — Other Ambulatory Visit: Payer: Self-pay

## 2023-09-16 DIAGNOSIS — L03116 Cellulitis of left lower limb: Secondary | ICD-10-CM | POA: Insufficient documentation

## 2023-09-16 DIAGNOSIS — Z4802 Encounter for removal of sutures: Secondary | ICD-10-CM | POA: Insufficient documentation

## 2023-09-16 DIAGNOSIS — Z7901 Long term (current) use of anticoagulants: Secondary | ICD-10-CM | POA: Diagnosis not present

## 2023-09-16 DIAGNOSIS — S81812D Laceration without foreign body, left lower leg, subsequent encounter: Secondary | ICD-10-CM | POA: Diagnosis not present

## 2023-09-16 LAB — CBC WITH DIFFERENTIAL/PLATELET
Abs Immature Granulocytes: 0.02 K/uL (ref 0.00–0.07)
Basophils Absolute: 0.1 K/uL (ref 0.0–0.1)
Basophils Relative: 1 %
Eosinophils Absolute: 0.3 K/uL (ref 0.0–0.5)
Eosinophils Relative: 3 %
HCT: 39.9 % (ref 36.0–46.0)
Hemoglobin: 13.7 g/dL (ref 12.0–15.0)
Immature Granulocytes: 0 %
Lymphocytes Relative: 20 %
Lymphs Abs: 1.9 K/uL (ref 0.7–4.0)
MCH: 30.6 pg (ref 26.0–34.0)
MCHC: 34.3 g/dL (ref 30.0–36.0)
MCV: 89.1 fL (ref 80.0–100.0)
Monocytes Absolute: 0.9 K/uL (ref 0.1–1.0)
Monocytes Relative: 9 %
Neutro Abs: 6.1 K/uL (ref 1.7–7.7)
Neutrophils Relative %: 67 %
Platelets: 301 K/uL (ref 150–400)
RBC: 4.48 MIL/uL (ref 3.87–5.11)
RDW: 12.2 % (ref 11.5–15.5)
WBC: 9.1 K/uL (ref 4.0–10.5)
nRBC: 0 % (ref 0.0–0.2)

## 2023-09-16 LAB — BASIC METABOLIC PANEL WITH GFR
Anion gap: 11 (ref 5–15)
BUN: 22 mg/dL (ref 8–23)
CO2: 23 mmol/L (ref 22–32)
Calcium: 9.7 mg/dL (ref 8.9–10.3)
Chloride: 107 mmol/L (ref 98–111)
Creatinine, Ser: 1.13 mg/dL — ABNORMAL HIGH (ref 0.44–1.00)
GFR, Estimated: 52 mL/min — ABNORMAL LOW (ref >60)
Glucose, Bld: 116 mg/dL — ABNORMAL HIGH (ref 70–99)
Potassium: 4.4 mmol/L (ref 3.5–5.1)
Sodium: 142 mmol/L (ref 135–145)

## 2023-09-16 MED ORDER — DOXYCYCLINE HYCLATE 100 MG PO TABS
100.0000 mg | ORAL_TABLET | Freq: Once | ORAL | Status: AC
  Administered 2023-09-17: 100 mg via ORAL
  Filled 2023-09-16: qty 1

## 2023-09-16 MED ORDER — DOXYCYCLINE HYCLATE 100 MG PO CAPS: 100.0000 mg | ORAL_CAPSULE | Freq: Two times a day (BID) | ORAL | 0 refills | Status: DC

## 2023-09-16 NOTE — ED Triage Notes (Signed)
 Pt presents via POV c/o possible wound infection of sutures on left lower leg. Sutures placed on 8/26. Reports hit leg on dishwasher door and caused it to bleed. Reports increased redness and swelling in the area of the sutures.

## 2023-09-16 NOTE — ED Provider Notes (Signed)
 [place] [facility]PARTMENT AT [facility] Provider Note   CSN: [id number] Arrival date & time: 09/16/23  2032     Patient presents with: Wound Check   [name] is a 68 y.o. female presents to the ER today for wound recheck. She has sutures placed in her left lower leg on 09/05/23 after cutting it on a car door. This was repaired in the ER with sutures and glue. She reports that a few days ago she accidentally took off some of the glue with the dishwasher door. No bleeding. Denies purulent discharge. She reports some clear drainage on the bandage. The patient reports that she started to have some redness surrounding the area and was worried about it becoming infected. Denies any fevers.    Wound Check       Prior to Admission medications   Medication Sig Start Date End Date Taking? Authorizing Provider  estradiol  (ESTRACE ) 0.1 MG/GM vaginal cream Apply a pea-sized amount to vaginal opening using index finger 2-3 nights/week 08/14/23   Matilda Senior, MD  acetaminophen  (TYLENOL ) 325 MG tablet Take 650 mg by mouth as needed.    [provider]  albuterol  (VENTOLIN  HFA) 108 (90 Base) MCG/ACT inhaler Inhale 2 puffs into the lungs every 6 (six) hours as needed for wheezing or shortness of breath. 12/27/22   Butler, Kristina, FNP  ALPRAZolam  (XANAX ) 0.5 MG tablet Take 0.5 mg by mouth 3 (three) times daily as needed for anxiety.    [provider]  anastrozole  (ARIMIDEX ) 1 MG tablet Take 1 tablet (1 mg total) by mouth daily. 09/04/23   Cloretta Arley NOVAK, MD  apixaban  (ELIQUIS ) 5 MG TABS tablet TAKE 1 TABLET TWICE DAILY 08/28/23   Lonni Slain, MD  BOTOX 200 units injection Once every 4 months 11/08/22   [provider]  buPROPion  (WELLBUTRIN  XL) 300 MG 24 hr tablet TAKE 1 TABLET BY MOUTH EVERY DAY 08/14/23   Caudle, Alexis Olivia, FNP  CALCIUM PO Take 2 tablets by mouth daily.    [provider]  cetirizine (ZYRTEC) 10 MG tablet  Take 10 mg by mouth daily.    [provider]  eletriptan  (RELPAX ) 40 MG tablet Take 1 tablet (40 mg total) by mouth every 2 (two) hours as needed for migraine or headache. May repeat in 2 hours if headache persists or recurs. 09/12/23   Caudle, Thersia Bitters, FNP  famotidine  (PEPCID ) 20 MG tablet Take 20 mg by mouth daily as needed for heartburn or indigestion.    [provider]  fluorometholone (FML) 0.1 % ophthalmic suspension Place 1 drop into both eyes 3 (three) times daily. 08/22/23   [provider]  gabapentin  (NEURONTIN ) 600 MG tablet Take 600 mg by mouth 2 (two) times daily.    [provider]  levothyroxine  (SYNTHROID ) 100 MCG tablet TAKE 1 TABLET BY MOUTH DAILY BEFORE BREAKFAST. 07/24/23   Caudle, Thersia Bitters, FNP  Melatonin 5 MG CAPS Take 10 mg by mouth at bedtime.    [provider]  Menthol  (BIOFREEZE) 10 % AERO     [provider]  methocarbamol (ROBAXIN) 500 MG tablet Take 500 mg by mouth 3 (three) times daily as needed. 08/08/23   [provider]  nystatin -triamcinolone  ointment (MYCOLOG) Apply 1 application to external vaginal surface twice daily for 7 days. 02/03/23   Caudle, Thersia Bitters, FNP  Omega-3 Fatty Acids (FISH OIL) 1000 MG CAPS 1 capsule    [provider]  omeprazole  (PRILOSEC) 40 MG capsule  Take 1 capsule (40 mg total) by mouth daily. 08/10/23   Caudle, Thersia Bitters, FNP  polyethylene glycol (MIRALAX  / GLYCOLAX ) packet Take 17 g by mouth every evening.    [provider]  propranolol  (INDERAL ) 60 MG tablet Take 1 tablet (60 mg total) by mouth daily. 11/10/20   Leverne Charlies Helling, PA-C  spironolactone  (ALDACTONE ) 25 MG tablet Take 0.5 tablets (12.5 mg total) by mouth daily. 02/23/23   Waddell Danelle ORN, MD  tretinoin  (RETIN-A ) 0.025 % cream Apply topically at bedtime. Apply to face on Monday and Thursday NIGHTS 05/09/23   Paci, Karina M, MD  Varenicline  Tartrate (TYRVAYA ) 0.03 MG/ACT SOLN Place  into the nose.    [provider]    Allergies: Other, Iodine, Tape, Codeine, Fentanyl , and Morphine  and codeine    Review of Systems  Constitutional:  Negative for chills and fever.  Skin:  Positive for wound.    Updated Vital Signs BP (!) 128/49 (BP Location: Left Arm)   Pulse 60   Temp (!) 97.2 F (36.2 C)   Resp 18   SpO2 99%   Physical Exam Vitals and nursing note reviewed.  Constitutional:      General: She is not in acute distress.    Appearance: She is not ill-appearing or toxic-appearing.  Eyes:     General: No scleral icterus. Pulmonary:     Effort: Pulmonary effort is normal. No respiratory distress.  Skin:    General: Skin is warm and dry.     Comments: Please see image. Blue suture material seen under dried glue. In the more superior/medial aspect of the wound, there does appear to be some exposed skin, but no dehiscence of the wound. Some mild swelling and erythema surrounding, but no induration or fluctuance. No discharge on deep palpation. DP and PT pulses palpable. Compartments are soft. No red streaking noted.   Neurological:     Mental Status: She is alert.     (all labs ordered are listed, but only abnormal results are displayed) Labs Reviewed  BASIC METABOLIC PANEL WITH GFR - Abnormal; Notable for the following components:      Result Value   Glucose, Bld 116 (*)    Creatinine, Ser 1.13 (*)    GFR, Estimated 52 (*)    All other components within normal limits  CBC WITH DIFFERENTIAL/PLATELET    EKG: None  Radiology: No results found.  Suture Removal  Date/Time: 09/16/2023 11:54 PM  Performed by: Bernis Ernst, PA-C Authorized by: Bernis Ernst, PA-C   Consent:    Consent obtained:  Verbal   Consent given by:  Patient   Risks, benefits, and alternatives were discussed: yes     Risks discussed:  Bleeding, pain and wound separation   Alternatives discussed:  No treatment Universal protocol:    Patient identity confirmed:   Verbally with patient Location:    Location:  Lower extremity   Lower extremity location:  Leg   Leg location:  L lower leg Procedure details:    Wound appearance:  Nonpurulent, warm, red and tender   Number of sutures removed:  8 Post-procedure details:    Post-removal:  Steri-Strips applied   Procedure completion:  Tolerated well, no immediate complications Comments:     Wound does have some surrounding erythema and increase in warmth.  There is no purulence on deep palpation.  Only small amount of bleeding after sutures were removed however I did have to remove the glue as well.  Does not appear  to be separated.  I did apply Steri-Strips to keep the area closed.  She has required wound care in the past from other shin lacerations, recommended she follow-up Dr. Harden who did her wound care previously.    Medications Ordered in the ED - No data to display   Medical Decision Making Amount and/or Complexity of Data Reviewed Labs: ordered.  Risk Prescription drug management.   68 y.o. female presents to the ER for evaluation of wound check. Differential diagnosis includes but is not limited to infection, poor healing. Vital signs BP 128/49, temp 97.78F otherwise unremarkable. Physical exam as noted above.   Workup ordered in triage.  I independently reviewed and interpreted the patient's labs.  CBC without leukocytosis or anemia.  BMP's visit 116, creatinine 1.13, slightly elevated patient's baseline.  Otherwise no other electrolyte abnormality.  Please see procedure note.  Patient had sutures placed on 09-05-2023.  8 sutures documented with 8 sutures removed.  I did have to remove some of the glue as well.  Risk benefits discussed with patient beforehand which he accepted.  I have placed some Steri-Strips over the area as well.  Nursing to place bandage over it.  Patient has required wound care previously for shin wounds that she has had on her right leg.  She reports that she saw Dr.  Harden for this.  I encouraged her to follow-up with Dr. Harden however I did still put an ambulatory referral for wound care.  Given she has some minor cellulitic changes surrounding it, I have put her on some doxycycline .  First dose given here.  She is not having systemic signs of cellulitis or sepsis.  We discussed wound care cleaning and stressed importance of following up.  She stable for discharge home.  We discussed the results of the labs/imaging. The plan is to take antibiotics as prescribed and follow with wound care. We discussed strict return precautions and red flag symptoms. The patient verbalized their understanding and agrees to the plan. The patient is stable and being discharged home in good condition.  Portions of this report may have been transcribed using voice recognition software. Every effort was made to ensure accuracy; however, inadvertent computerized transcription errors may be present.    Final diagnoses:  Visit for suture removal  Cellulitis of left lower extremity    ED Discharge Orders          Ordered    doxycycline  (VIBRAMYCIN ) 100 MG capsule  2 times daily        09/16/23 2356    Ambulatory referral to Wound Clinic        09/16/23 2357               Bernis Ernst, PA-C 09/17/23 1454    Yolande Lamar BROCKS, MD 09/21/23 [phone]

## 2023-09-16 NOTE — Discharge Instructions (Signed)
 You were seen in the emerged from today for evaluation of your leg.  We did remove your sutures today.  You will need to follow-up with the wound care specialist.  I have placed an ambulatory referral for one however you can also follow-up with Dr. Harden as you did previously.  I am starting you on an antibiotic which will take twice daily for the next week.  Please make sure you continue to clean the wound daily with Dial soap and water.  Please make sure that you are elevating your legs as well but the swelling.  I have included additional information into the discharge report for you to review.  If you have any concerns, new or worsening symptoms, please return to your nearest emergency department for reevaluation.  Contact a doctor if: You have a fever. You do not start to get better after 1-2 days of treatment. Your bone or joint under the infected area starts to hurt after the skin has healed. Your infection comes back in the same area or another area. Signs of this may include: You have a swollen bump in the area. Your red area gets larger, turns dark in color, or hurts more. You have more fluid coming from the wound. Pus or a bad smell develops in your infected area. You have more pain. You feel sick and have muscle aches and weakness. You develop vomiting or watery poop that will not go away. Get help right away if: You see red streaks coming from the area. You notice the skin turns purple or black and falls off. These symptoms may be an emergency. Get help right away. Call 911. Do not wait to see if the symptoms will go away. Do not drive yourself to the hospital.

## 2023-09-22 ENCOUNTER — Ambulatory Visit: Payer: Self-pay

## 2023-09-22 NOTE — Telephone Encounter (Signed)
 FYI Only or Action Required?: FYI only for provider.  Patient was last seen in primary care on 08/10/2023 by Knute Thersia Bitters, FNP.  Called Nurse Triage reporting No chief complaint on file..  Symptoms began several days ago.  Interventions attempted: Nothing.  Symptoms are: gradually worsening.  Triage Disposition: See PCP When Office is Open (Within 3 Days)  Patient/caregiver understands and will follow disposition?: Yes   Please contact the patient at [phone]  Copied from CRM #[id number]. Topic: Clinical - Red Word Triage >> Sep 22, 2023 10:42 AM Edsel HERO wrote: Medication reaction Reason for Disposition  Prescription request for new medicine (not a refill)  Answer Assessment - Initial Assessment Questions 1. NAME of MEDICINE: What medicine(s) are you calling about?     Doxycycline   2. QUESTION: What is your question? (e.g., double dose of medicine, side effect)     Side Effects, Reaction suspected  3. PRESCRIBER: Who prescribed the medicine? Reason: if prescribed by specialist, call should be referred to that group.     Hospital, ER  4. SYMPTOMS: Do you have any symptoms? If Yes, ask: What symptoms are you having?  How bad are the symptoms (e.g., mild, moderate, severe)     Nausea, Lethargic, Fatigue  5. PREGNANCY:  Is there any chance that you are pregnant? When was your last menstrual period?     No and No  Protocols used: Medication Question Call-A-AH

## 2023-09-22 NOTE — Telephone Encounter (Signed)
 Routing encounter to [employer] for review.

## 2023-09-25 ENCOUNTER — Ambulatory Visit: Payer: Self-pay | Admitting: *Deleted

## 2023-09-25 ENCOUNTER — Ambulatory Visit: Admitting: Student in an Organized Health Care Education/Training Program

## 2023-09-25 VITALS — BP 107/57 | HR 54

## 2023-09-25 DIAGNOSIS — L03116 Cellulitis of left lower limb: Secondary | ICD-10-CM | POA: Diagnosis not present

## 2023-09-25 MED ORDER — CEPHALEXIN 500 MG PO CAPS: 500.0000 mg | ORAL_CAPSULE | Freq: Two times a day (BID) | ORAL | 0 refills | Status: AC

## 2023-09-25 NOTE — Telephone Encounter (Signed)
 FYI Only or Action Required?: FYI only for provider.  Patient was last seen in primary care on 08/10/2023 by Knute Thersia Bitters, FNP.  Called Nurse Triage reporting Joint Swelling.  Symptoms began a week ago.  Interventions attempted: Rest, hydration, or home remedies.  Symptoms are: unchanged.  Triage Disposition: See Physician Within 24 Hours  Patient/caregiver understands and will follow disposition?: yes   Reason for Disposition  Looks like a boil, infected sore, deep ulcer or other infected rash (spreading redness, pus)  Answer Assessment - Initial Assessment Questions 1. LOCATION: Which ankle is swollen? Where is the swelling?     Patient has cut with cellulitis- continued swelling, left 2. ONSET: When did the swelling start?     Patient states 1 week 3. SWELLING: How bad is the swelling? Or, How large is it? (e.g., mild, moderate, severe; size of localized swelling)      Moderate/severe 4. PAIN: Is there any pain? If Yes, ask: How bad is it? (Scale 0-10; or none, mild, moderate, severe)     1-2/10 5. CAUSE: What do you think caused the ankle swelling?     No prior swelling prior to cellulitis 6. OTHER SYMPTOMS: Do you have any other symptoms? (e.g., fever, chest pain, difficulty breathing, calf pain)     Sometimes at night patient gets out of breath- nasal congestion  Patient has stopped the antibiotic for 3 days- caused nausea. Took for four days and stopped.  Protocols used: Ankle Swelling-A-AH   Copied from CRM [id number]. Topic: Clinical - Red Word Triage >> Sep 25, 2023  8:09 AM Cleave MATSU wrote: Red Word that prompted transfer to Nurse Triage: pt ankle is swelling and can't sleep at night.

## 2023-09-25 NOTE — Progress Notes (Signed)
 Acute Office Visit  Subjective:     Patient ID: [name], female    DOB: [birth year], 68 y.o.   MRN: [id number]  HPI  Discussed the use of AI scribe software for clinical note transcription with the patient, who gave verbal consent to proceed.  History of Present Illness Kimberla KYLEAH PENSABENE is a 68 year old female who presents with a left leg wound and cellulitis following a laceration.  She sustained a left leg laceration on September 05, 2023, after accidentally cutting her leg while closing a car door. Initially, she received stitches at the ER, which were later removed. Post-removal, she developed cellulitis with redness extending to her foot.  She was prescribed doxycycline  on September 16, 2023, but discontinued it after two days due to significant gastrointestinal upset. She reports persistent swelling, which worsens in the evening, and she tries to keep her leg elevated on a pillow during sleep. She uses Steri-Strips since the removal of stitches.  She manages the wound with daily dressings using pads and gauze and cleans it with Dial soap. No fever at home, but she experiences difficulty sleeping due to nasal congestion, requiring her to sleep in a recliner or on the couch.  Her current medications include Evenity  for osteoporosis. She has a history of back surgeries and a pacemaker. She reports thin skin, which she believes is hereditary, as her father had similar skin issues.  She is concerned about her ability to walk her dog and continues walking as tolerated to maintain strength. She takes medication for restless legs and has tried drinking orange juice to alleviate symptoms.     Objective:    BP (!) 107/57   Pulse (!) 54   SpO2 97%    Physical Exam  Gen: High frailty of advanced age Skin: On her left anterior lower leg there is about a 4 cm x 2 cm wound with surrounding erythema and purulent drainage.  I cleaned the area with gauze and Betadine.  I removed  moderate amount of granulation tissue with gauze debridements.  No underlying purulence or abscess.  Discomfort was tolerable.  Surrounding erythema is about 5 cm, no lymphatic streaking.  Very fragile skin with evidence of prior skin tears. Ext: Left lower extremity has 1+ pitting edema.      Assessment & Plan:    Problem List Items Addressed This Visit       Unprioritized   Left leg cellulitis - Primary   Post-laceration cellulitis and wound infection persist with swelling and erythema. Doxycycline  was not tolerated. She has no fever, and Keflex  is chosen due to tolerance and no MRSA history. Prescribe Keflex  for infection management. Recommend compression stockings to reduce swelling and aid healing. Advised wet-to-dry dressing changes with Telfa pad and wet gauze, followed by compression sock application.  She was referred to wound care by the emergency department.  I will arrange for home health nursing to assist with wound management at home. Encouraged ambulation to improve circulation and aid recovery.  Follow-up with me at the end of the week for wound recheck, if there is evidence of worsening purulence, may need to try Bactrim  to cover for MRSA.  Hopefully we can avoid this given her modestly reduced GFR.      Relevant Medications   cephALEXin  (KEFLEX ) 500 MG capsule   Other Relevant Orders   Ambulatory referral to Home Health    Meds ordered this encounter  Medications   cephALEXin  (KEFLEX ) 500 MG capsule  Sig: Take 1 capsule (500 mg total) by mouth 2 (two) times daily for 7 days.    Dispense:  14 capsule    Refill:  0    Return in 4 days (on 09/29/2023).  Cleatus Debby Specking, MD

## 2023-09-25 NOTE — Assessment & Plan Note (Signed)
 Post-laceration cellulitis and wound infection persist with swelling and erythema. Doxycycline  was not tolerated. She has no fever, and Keflex  is chosen due to tolerance and no MRSA history. Prescribe Keflex  for infection management. Recommend compression stockings to reduce swelling and aid healing. Advised wet-to-dry dressing changes with Telfa pad and wet gauze, followed by compression sock application.  She was referred to wound care by the emergency department.  I will arrange for home health nursing to assist with wound management at home. Encouraged ambulation to improve circulation and aid recovery.  Follow-up with me at the end of the week for wound recheck, if there is evidence of worsening purulence, may need to try Bactrim  to cover for MRSA.  Hopefully we can avoid this given her modestly reduced GFR.

## 2023-09-25 NOTE — Patient Instructions (Signed)
  VISIT SUMMARY: Today, we discussed the wound and cellulitis on your left leg, which developed after a laceration. We reviewed your current symptoms, including swelling and redness, and addressed your concerns about walking your dog and managing your restless legs. We also talked about your chronic skin fragility and its impact on your wound healing.  YOUR PLAN: -CELLULITIS AND WOUND INFECTION OF LEFT LOWER LEG: Cellulitis is a bacterial skin infection that causes redness, swelling, and pain. We will switch your antibiotic to Keflex , as you had issues with doxycycline . You should use compression stockings to help reduce swelling and promote healing. For wound care, perform wet-to-dry dressing changes with a Telfa pad and wet gauze, then apply a compression sock. We will refer you to Dr. Harden for specialized wound care and arrange for home health nursing to assist you at home. Keep walking as much as you can to improve circulation and aid recovery.  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 Continue your current osteoporosis treatment with Evenity . We will monitor your skin condition and take steps to protect it from further injury.  INSTRUCTIONS: Please follow up with Dr. Harden for wound care services. Home health nursing will be arranged to assist with your wound management at home. Continue using compression stockings and perform the recommended dressing changes. Keep walking as tolerated to improve circulation.

## 2023-09-26 ENCOUNTER — Encounter (HOSPITAL_BASED_OUTPATIENT_CLINIC_OR_DEPARTMENT_OTHER): Payer: Medicare PPO

## 2023-09-26 ENCOUNTER — Telehealth: Payer: Self-pay | Admitting: *Deleted

## 2023-09-26 NOTE — Telephone Encounter (Unsigned)
 Copied from CRM [id number]. Topic: Clinical - Home Health Verbal Orders >> Sep 26, 2023  1:14 PM Cherylann RAMAN wrote: Caller/Agency: Deandra/ [facility] Callback Number: [phone] VM is confidential, may leave detailed message Service Requested: Skilled Nursing Frequency: 2 week 8 for wound care Any new concerns about the patient? No, Would like to go through the medications for the patient with a nurse

## 2023-09-27 NOTE — Telephone Encounter (Signed)
 Called and spoke with Deandra from  [facility] giving her the verbal okay for the wound care frequency. Also went over pt's medication list with her.

## 2023-09-28 ENCOUNTER — Ambulatory Visit: Admitting: Physician Assistant

## 2023-09-28 ENCOUNTER — Ambulatory Visit: Admitting: Urology

## 2023-09-28 DIAGNOSIS — M818 Other osteoporosis without current pathological fracture: Secondary | ICD-10-CM | POA: Diagnosis not present

## 2023-09-28 DIAGNOSIS — M8000XA Age-related osteoporosis with current pathological fracture, unspecified site, initial encounter for fracture: Secondary | ICD-10-CM

## 2023-09-28 MED ORDER — ROMOSOZUMAB-AQQG 105 MG/1.17ML ~~LOC~~ SOSY
210.0000 mg | PREFILLED_SYRINGE | SUBCUTANEOUS | Status: AC
  Administered 2023-11-13: 210 mg via SUBCUTANEOUS

## 2023-09-29 ENCOUNTER — Ambulatory Visit: Admitting: Student in an Organized Health Care Education/Training Program

## 2023-09-29 ENCOUNTER — Encounter: Payer: Self-pay | Admitting: Student in an Organized Health Care Education/Training Program

## 2023-09-29 VITALS — BP 119/46 | HR 72 | Wt 161.0 lb

## 2023-09-29 DIAGNOSIS — Z23 Encounter for immunization: Secondary | ICD-10-CM | POA: Diagnosis not present

## 2023-09-29 DIAGNOSIS — L03116 Cellulitis of left lower limb: Secondary | ICD-10-CM | POA: Diagnosis not present

## 2023-09-29 NOTE — Progress Notes (Signed)
   Established Patient Office Visit  Subjective   Patient ID: [name], female    DOB: [birth year]  Age: 68 y.o. MRN: [id number]  Chief Complaint  Patient presents with   Medical Management of Chronic Issues    Left Leg Cellulitis follow up from 9/15. Wants to move everything over to Dr. Jerrell from Dr. Knute    HPI  Discussed the use of AI scribe software for clinical note transcription with the patient, who gave verbal consent to proceed.  History of Present Illness [name] is a 68 year old female who presents for follow-up of a left leg wound. She is accompanied by her husband, [name].  The wound on her left leg was initially swollen and painful for the first couple of days but has since improved. She is currently on antibiotics with no adverse effects. She uses compression stockings and changes the bandage daily. Home health services visited once for wound care, but she declined further visits due to this appointment. A piece of skin was expected to fall off, but she did not observe it happening.  She has been walking more, including taking her dog for walks, although she feels she should be more active.     Objective:     BP (!) 119/46   Pulse 72   Wt 161 lb (73 kg)   SpO2 100%   BMI 24.84 kg/m   Physical Exam  Gen: Well-appearing woman Ext: On the left leg there is much improved erythema, minimal swelling today.  The skin breakdown is healing well.  There is no purulence or odor today.  Looks much better than it did earlier this week.       Assessment & Plan:    Problem List Items Addressed This Visit       High   Left leg cellulitis - Primary   Improvement is noted with reduced swelling and drainage. Healthy granulation tissue is present, and pain is manageable. Antibiotics are well-tolerated. Continue antibiotics for seven days. Perform daily dressing changes. Wear compression hose for two to four weeks. Follow up with Dr Harden as  scheduled.       Return in about 4 weeks (around 10/27/2023).    Cleatus Debby Jerrell, MD

## 2023-09-29 NOTE — Progress Notes (Signed)
 Office Visit Note   Patient: [name]           Date of Birth: 06/29/1955           MRN: [id number] Visit Date: 09/28/2023              Requested by: Knute Thersia Bitters, FNP [address] [place],  [place] [address] PCP: Knute Thersia Bitters, FNP  Chief Complaint  Patient presents with   Injections    Patient in today for her Evenity  injection  she tolerated it well and will come back in 30 days for her next injection on [facility] schedule      HPI:   Assessment & Plan: Visit Diagnoses:  1. Age-related osteoporosis with current pathological fracture, initial encounter     Plan:   Follow-Up Instructions: No follow-ups on file.   Ortho Exam  Patient is alert, oriented, no adenopathy, well-dressed, normal affect, normal respiratory effort.     Imaging: No results found. No images are attached to the encounter.  Labs: Lab Results  Component Value Date   ESRSEDRATE 4 01/09/2023   ESRSEDRATE 18 04/07/2021   ESRSEDRATE 55 (H) 02/12/2010   CRP 6 01/09/2023   REPTSTATUS 03/02/2017 FINAL 02/25/2017   GRAMSTAIN  02/09/2010    RARE WBC PRESENT,BOTH PMN AND MONONUCLEAR NO ORGANISMS SEEN   CULT  02/25/2017    NO GROWTH 5 DAYS Performed at [facility], [address] [place], [place] [address]      Lab Results  Component Value Date   ALBUMIN  4.0 02/04/2021   ALBUMIN  3.2 (L) 02/27/2017   ALBUMIN  3.9 02/25/2017    Lab Results  Component Value Date   MG 1.7 02/12/2010   MG 1.6 02/11/2010   MG 2.0 02/09/2010   Lab Results  Component Value Date   VD25OH CANCELED 04/24/2023    No results found for: PREALBUMIN    Latest Ref Rng & Units 09/16/2023    8:56 PM 01/09/2023    2:52 PM 08/17/2022    3:28 PM  CBC EXTENDED  WBC 4.0 - 10.5 K/uL 9.1  9.6  9.4   RBC 3.87 - 5.11 MIL/uL 4.48  4.70  4.70   Hemoglobin 12.0 - 15.0 g/dL 86.2  85.9  85.9   HCT 36.0 - 46.0 % 39.9  43.1  42.4   Platelets 150 - 400 K/uL 301  356   374   NEUT# 1.7 - 7.7 K/uL 6.1  5.6  5.1   Lymph# 0.7 - 4.0 K/uL 1.9  2.4  2.4      There is no height or weight on file to calculate BMI.  Orders:  No orders of the defined types were placed in this encounter.  Meds ordered this encounter  Medications   Romosozumab -aqqg (EVENITY ) 105 MG/1. injection 210 mg    Restricted to Outpatient Use.  Do Not Tube.     Procedures: No procedures performed  Clinical Data: No additional findings.  ROS:  All other systems negative, except as noted in the HPI. Review of Systems  Objective: Vital Signs: There were no vitals taken for this visit.  Specialty Comments:  No specialty comments available.  PMFS History: Patient Active Problem List   Diagnosis Date Noted   Left leg cellulitis 09/25/2023   Chronic constipation 07/11/2023   Anxiety and depression 07/11/2023   B12 deficiency 07/10/2023   Benign essential tremor 07/10/2023   Bilateral carpal tunnel syndrome 07/10/2023   Acquired kyphosis  07/10/2023   Chronic pain syndrome 07/10/2023   Generalized anxiety disorder 07/10/2023   Idiopathic progressive polyneuropathy 07/10/2023   Intractable chronic migraine without aura 07/10/2023   Memory changes 07/10/2023   Migraine variant with headache 07/10/2023   Neck pain 07/10/2023   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   Other hammer toe(s) (acquired), left foot 07/10/2023   Restless legs syndrome 07/10/2023   Sacroiliitis, not elsewhere classified (HCC) 07/10/2023   Spasm 07/10/2023   Encounter for procedure for purposes other than remedying health state, unspecified 05/09/2023   Cellulitis of right lower extremity 02/03/2023   Delayed healing of traumatic wound 02/03/2023   Biventricular ICD (implantable cardioverter-defibrillator) in place    DOE (dyspnea on exertion) 04/07/2021   Age-related osteoporosis without current pathological fracture 11/29/2020   Atrophy of vagina 11/29/2020   Mild recurrent major  depression (HCC) 11/29/2020   Breast CA (HCC) 02/18/2020   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   Rosacea 11/05/2019   Scar 11/05/2019   Biventricular cardiac pacemaker in situ 10/08/2018   Long term current use of anticoagulant therapy 10/23/2017   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   Trigeminal neuralgia 12/14/2012   Insomnia, persistent 11/03/2011   AICD (automatic cardioverter/defibrillator) present    Aortic regurgitation    Nonischemic cardiomyopathy (HCC) 01/21/2011   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/17/2010   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   Esophagitis, reflux 08/24/2010   Idiopathic peripheral neuropathy 08/24/2010   Chronic systolic CHF (congestive heart failure) (HCC) 02/24/2010   Paroxysmal atrial fibrillation (HCC) 02/23/2010   Hypothyroidism    History of migraine headaches    Left bundle branch block    Idiopathic scoliosis and kyphoscoliosis     Class: Chronic   Lumbar canal stenosis 12/25/2009   Menopause 04/15/2009   Anxiety state 07/24/2008   Avitaminosis D 03/21/2008   Atypical migraine 01/30/2008   Benign essential HTN 01/30/2008   Chronic migraine w/o aura w/o status migrainosus, not intractable 01/30/2008   Paroxysmal digital cyanosis 02/15/2007   Raynaud's phenomenon 02/15/2007   Past Medical History:  Diagnosis Date   AF (paroxysmal atrial fibrillation) (HCC)    Allergy    Anxiety    Arthritis    BCC (basal cell carcinoma) 03/11/2003   left distal lower leg ankle-tx dr helga   Biventricular cardiac pacemaker in situ    a. prior CRT-D in 2013 with downgrade to CRT-Pacemaker only in 11/2016.   Breast cancer (HCC) 12/12/2019   Cataract    Chronic systolic CHF (congestive heart failure) (HCC)    Complication of anesthesia    aspiration   Depression    Essential tremor    GERD (gastroesophageal reflux disease)    otc   Hypertension    Hypothyroidism    LBBB (left bundle branch  block)    conduction disorder   Migraines    Mild aortic insufficiency    Neuromuscular disorder (HCC)    NICM (nonischemic cardiomyopathy) (HCC)    a. 2012: normal coronaries, EF 25-30%. b. improved to 55% 11/2016.   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   in situ- right lower leg-anterior (CX35FU)   Scoliosis     Family History  Problem Relation Age of Onset   Heart disease Mother  Heart failure Mother 71       CHF   Stroke Mother    Hypertension Mother    Varicose Veins Mother    Heart disease Father 42   Macular degeneration Father    Hearing loss Father    Vision loss Father    Heart attack Sister    Depression Sister    Heart disease Sister    Varicose Veins Sister    Atrial fibrillation Sister    Heart disease Sister    Arthritis Sister     Past Surgical History:  Procedure Laterality Date   ARTERY BIOPSY  12/29/2011   Procedure: MINOR BIOPSY TEMPORAL ARTERY;  Surgeon: Morene ONEIDA Olives, MD;  Location: [facility];  Service: General;  Laterality: Right;  Right temporal artery biopsy   BACK SURGERY  12/31/2009   for flat back syndrome; fused bottom of my back   BI-VENTRICULAR IMPLANTABLE CARDIOVERTER DEFIBRILLATOR Left 01/20/2011   Procedure: BI-VENTRICULAR IMPLANTABLE CARDIOVERTER DEFIBRILLATOR  (CRT-D);  Surgeon: Danelle LELON Birmingham, MD;  Location: [facility] CATH LAB;  Service: Cardiovascular;  Laterality: Left;   BIV ICD GENERATOR CHANGEOUT N/A 11/24/2016   Procedure: BIV ICD GENERATOR CHANGEOUT;  Surgeon: Birmingham Danelle LELON, MD;  Location: [facility] INVASIVE CV LAB;  Service: Cardiovascular;  Laterality: N/A;   BREAST LUMPECTOMY WITH RADIOACTIVE SEED LOCALIZATION Left 01/22/2020   Procedure: LEFT BREAST LUMPECTOMY WITH RADIOACTIVE SEED LOCALIZATION;   Surgeon: Belinda Cough, MD;  Location: [facility];  Service: General;  Laterality: Left;  LMA   BREAST SURGERY     CERVICAL ABLATION  03/29/2023   CHOLECYSTECTOMY  ~1996   FOOT NEUROMA SURGERY  ~ 2003   right   RE-EXCISION OF BREAST LUMPECTOMY Left 03/03/2020   Procedure: RE-EXCISION MARGINS OF LEFT BREAST LUMPECTOMY;  Surgeon: Belinda Cough, MD;  Location: [facility];  Service: General;  Laterality: Left;   ROTATOR CUFF REPAIR  ~ 2009   left   SHOULDER ARTHROSCOPY W/ ROTATOR CUFF REPAIR Right 09/21/2011   SKIN CANCER EXCISION     nose, forehead, left leg   SPINE SURGERY     TEE WITHOUT CARDIOVERSION N/A 03/01/2017   Procedure: TRANSESOPHAGEAL ECHOCARDIOGRAM (TEE);  Surgeon: Raford Riggs, MD;  Location: [facility]OSCOPY;  Service: Cardiovascular;  Laterality: N/A;   THYROID  LOBECTOMY  ~ 2006   right   TUBAL LIGATION  ~ 1983   VAGINAL HYSTERECTOMY  ~ 2009   VESICOVAGINAL FISTULA CLOSURE W/ TAH     Social History   Occupational History   Occupation: Runner, broadcasting/film/video    Comment: n/a  Tobacco Use   Smoking status: Never    Passive exposure: Never   Smokeless tobacco: Never  Vaping Use   Vaping status: Never Used  Substance and Sexual Activity   Alcohol  use: No   Drug use: No   Sexual activity: Yes

## 2023-09-29 NOTE — Assessment & Plan Note (Signed)
 Improvement is noted with reduced swelling and drainage. Healthy granulation tissue is present, and pain is manageable. Antibiotics are well-tolerated. Continue antibiotics for seven days. Perform daily dressing changes. Wear compression hose for two to four weeks. Follow up with Dr Harden as scheduled.

## 2023-09-29 NOTE — Addendum Note (Signed)
 Addended by: RANDINE HOVE R on: 09/29/2023 11:56 AM   Modules accepted: Orders

## 2023-09-29 NOTE — Patient Instructions (Signed)
  VISIT SUMMARY: Today, you came in for a follow-up appointment regarding the wound on your left leg. You were accompanied by your husband, [name]. We discussed the progress of your wound and reviewed your general health maintenance, including vaccinations.  YOUR PLAN: -LEFT LEG CELLULITIS: Cellulitis is a bacterial skin infection that causes redness, swelling, and pain. Your wound is showing improvement with reduced swelling and drainage, and healthy tissue is forming. Continue taking your antibiotics for seven more days. Change the dressing daily using mild soap and avoid rubbing the area. Wear compression stockings for the next two to four weeks. Follow up with Doctor Harden as scheduled.  -GENERAL HEALTH MAINTENANCE: We discussed the importance of staying up-to-date with your vaccinations, including the COVID-19 and flu vaccines. There is no interaction between these vaccines and your Evenity  shots. You received the flu vaccine today, and you should schedule your COVID-19 vaccination at the pharmacy.  INSTRUCTIONS: Please continue your antibiotics for seven more days and perform daily dressing changes. Wear your compression stockings for the next two to four weeks. Follow up with Doctor Harden as scheduled. Additionally, schedule your COVID-19 vaccination at the pharmacy.

## 2023-10-03 ENCOUNTER — Ambulatory Visit: Admitting: Orthopedic Surgery

## 2023-10-03 DIAGNOSIS — I87332 Chronic venous hypertension (idiopathic) with ulcer and inflammation of left lower extremity: Secondary | ICD-10-CM | POA: Diagnosis not present

## 2023-10-03 DIAGNOSIS — B351 Tinea unguium: Secondary | ICD-10-CM | POA: Diagnosis not present

## 2023-10-05 NOTE — Progress Notes (Signed)
 Remote PPM Transmission

## 2023-10-06 ENCOUNTER — Ambulatory Visit: Payer: Self-pay

## 2023-10-06 ENCOUNTER — Other Ambulatory Visit: Payer: Self-pay | Admitting: Student in an Organized Health Care Education/Training Program

## 2023-10-06 MED ORDER — FLUCONAZOLE 150 MG PO TABS: 150.0000 mg | ORAL_TABLET | Freq: Once | ORAL | 0 refills | Status: AC

## 2023-10-06 MED ORDER — FLUCONAZOLE 150 MG PO TABS: 150.0000 mg | ORAL_TABLET | Freq: Once | ORAL | 0 refills | Status: DC

## 2023-10-06 NOTE — Telephone Encounter (Signed)
   Copied from CRM [id number]. Topic: Clinical - Medical Advice >> Oct 06, 2023  1:54 PM Alfonso ORN wrote: Reason for CRM: Chris calling from , [facility] requesting medication for patient  complaining of itchy in the vaginal area  something for yeast for example diflucain or something else Pt uses pharmacy:  [id number] - [place], [place] - [address] [place] [address] Phone: [phone] Fax: [phone] Hours: Not open 24 hours

## 2023-10-06 NOTE — Telephone Encounter (Signed)
 FYI Only or Action Required?: Action required by provider: new medication.  Patient was last seen in primary care on 09/29/2023 by Jerrell Cleatus Ned, MD.  Called Nurse Triage reporting New Med Request and Vaginal Itching.  Symptoms began several days ago.  Interventions attempted: Rest, hydration, or home remedies.  Symptoms are: gradually worsening.  Triage Disposition: See Physician Within 24 Hours  Patient/caregiver understands and will follow disposition?: No, wishes to speak with PCP      Reason for Disposition  MODERATE-SEVERE itching (i.e., interferes with school, work, or sleep)  Answer Assessment - Initial Assessment Questions Additional info: Patient declines appointment at this time unless required, she states she has had many appointments recently, she recently completed antibiotics and is experiencing external vaginal itching. She is requesting for PCP to prescribed medication for vaginal itching. Please follow up and advise if medication will be prescribed or if she will need an appointment.    1. SYMPTOM: What's the main symptom you're concerned about? (e.g., pain, itching, dryness)     Vaginal itching 2. LOCATION: Where is the  itching  located? (e.g., inside/outside, left/right)     External vaginal  3. ONSET: When did the  itching  start?     Few days 4. PAIN: Is there any pain? If Yes, ask: How bad is it? (Scale: 1-10; mild, moderate, severe)     denies 5. ITCHING: Is there any itching? If Yes, ask: How bad is it? (Scale: 1-10; mild, moderate, severe)     moderate 6. CAUSE: What do you think is causing the discharge? Have you had the same problem before? What happened then?     Recent antibiotic 7. OTHER SYMPTOMS: Do you have any other symptoms? (e.g., fever, itching, vaginal bleeding, pain with urination, injury to genital area, vaginal foreign body)     Denies odor, pain, fever, discharge.  Protocols used: Vaginal  Symptoms-A-AH

## 2023-10-06 NOTE — Addendum Note (Signed)
 Addended by: JERRELL SOLIAN T on: 10/06/2023 03:08 PM   Modules accepted: Orders

## 2023-10-06 NOTE — Telephone Encounter (Signed)
 I spoke with patient by phone.  Symptoms are consistent with vulvovaginal candidiasis.  At risk for this given recent antibiotic exposure.  Will treat with Diflucan  150 mg once.  If symptoms worsen, come into the office for an exam.

## 2023-10-08 ENCOUNTER — Encounter: Payer: Self-pay | Admitting: Orthopedic Surgery

## 2023-10-08 NOTE — Progress Notes (Signed)
 Office Visit Note   Patient: [name]           Date of Birth: 1955/09/24           MRN: [id number] Visit Date: 10/03/2023              Requested by: Knute Thersia Bitters, FNP [address] [place],  [place] [address] PCP: Jerrell Cleatus Ned, MD  Chief Complaint  Patient presents with   Left Leg - Open Wound      HPI: Discussed the use of AI scribe software for clinical note transcription with the patient, who gave verbal consent to proceed.  History of Present Illness [name] is a 68 year old female with venous insufficiency who presents with a venous ulcer on her left leg.  She developed a venous ulcer on her left leg after injuring it by hitting it on her dishwasher. Concerned about infection, she visited the ER where her stitches were removed. Home health services were involved, and her antibiotic was changed by her primary care provider.  The ulcer has been present for approximately four weeks. She uses compression socks purchased from [place], although she is unsure of the compression level. She experiences discomfort from certain socks due to neuropathy.  She has a history of varicose veins and wears support socks daily to manage symptoms. She wears them during activities such as bicycling and hiking.     Assessment & Plan: Visit Diagnoses:  1. Chronic venous hypertension (idiopathic) with ulcer and inflammation of left lower extremity (HCC)     Plan: Assessment and Plan Assessment & Plan Left lower extremity venous ulcer due to chronic venous insufficiency with edema Chronic venous insufficiency with edema caused a 2 cm venous ulcer on the left lower extremity. Good foot circulation noted. - Provide Vosh for cleansing with soap and water, then apply Vosh. - Recommend continuous use of Vive compression sock against the wound. Discussed neuropathy discomfort; she agreed to try. - Advise against bandages. - Follow up in  two weeks.  Onychomycosis with thickened discolored nails Thickened, discolored nails consistent with onychomycosis.      Follow-Up Instructions: Return if symptoms worsen or fail to improve.   Ortho Exam  Patient is alert, oriented, no adenopathy, well-dressed, normal affect, normal respiratory effort. Physical Exam EXTREMITIES: Pitting brawny edema in both lower extremities with a 2 cm traumatic venous ulcer on the left. Palpable pulses in extremities. Thickened discolored onychomycotic nails, trimmed times nine without complications. Calf circumference 33 cm.      Imaging: No results found. No images are attached to the encounter.  Labs: Lab Results  Component Value Date   ESRSEDRATE 4 01/09/2023   ESRSEDRATE 18 04/07/2021   ESRSEDRATE 55 (H) 02/12/2010   CRP 6 01/09/2023   REPTSTATUS 03/02/2017 FINAL 02/25/2017   GRAMSTAIN  02/09/2010    RARE WBC PRESENT,BOTH PMN AND MONONUCLEAR NO ORGANISMS SEEN   CULT  02/25/2017    NO GROWTH 5 DAYS Performed at [facility] [place] [facility], [address] [place], [place] [address]      Lab Results  Component Value Date   ALBUMIN  4.0 02/04/2021   ALBUMIN  3.2 (L) 02/27/2017   ALBUMIN  3.9 02/25/2017    Lab Results  Component Value Date   MG 1.7 02/12/2010   MG 1.6 02/11/2010   MG 2.0 02/09/2010   Lab Results  Component Value Date   VD25OH CANCELED 04/24/2023    No results found for: PREALBUMIN  Latest Ref Rng & Units 09/16/2023    8:56 PM 01/09/2023    2:52 PM 08/17/2022    3:28 PM  CBC EXTENDED  WBC 4.0 - 10.5 K/uL 9.1  9.6  9.4   RBC 3.87 - 5.11 MIL/uL 4.48  4.70  4.70   Hemoglobin 12.0 - 15.0 g/dL 86.2  85.9  85.9   HCT 36.0 - 46.0 % 39.9  43.1  42.4   Platelets 150 - 400 K/uL 301  356  374   NEUT# 1.7 - 7.7 K/uL 6.1  5.6  5.1   Lymph# 0.7 - 4.0 K/uL 1.9  2.4  2.4      There is no height or weight on file to calculate BMI.  Orders:  No orders of the defined types were placed in this encounter.  No orders  of the defined types were placed in this encounter.    Procedures: No procedures performed  Clinical Data: No additional findings.  ROS:  All other systems negative, except as noted in the HPI. Review of Systems  Objective: Vital Signs: There were no vitals taken for this visit.  Specialty Comments:  No specialty comments available.  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   Biventricular cardiac pacemaker in situ 10/08/2018   Long term current use of anticoagulant therapy 10/23/2017   Trigeminal neuralgia 12/14/2012   AICD (automatic cardioverter/defibrillator) present    Aortic regurgitation    Nonischemic cardiomyopathy (HCC) 01/21/2011   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 prior CRT-D in 2013 with downgrade to CRT-Pacemaker only in  11/2016.   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 2012:  normal coronaries, EF 25-30%. b. improved to 55% 11/2016.   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    Family History  Problem Relation Age of Onset   Heart disease Mother    Heart failure Mother 58       CHF   Stroke Mother    Hypertension Mother    Varicose Veins Mother    Heart disease Father 79   Macular degeneration Father    Hearing loss Father    Vision loss Father    Heart attack Sister    Depression Sister    Heart disease Sister    Varicose Veins Sister    Atrial fibrillation Sister    Heart disease Sister    Arthritis Sister     Past Surgical History:  Procedure Laterality Date   ARTERY BIOPSY  12/29/2011   Procedure: MINOR BIOPSY TEMPORAL ARTERY;  Surgeon: Morene ONEIDA Olives, MD;  Location: [facility];  Service: General;  Laterality: Right;  Right temporal artery biopsy   BACK SURGERY  12/31/2009   for flat back syndrome; fused bottom of  my back   BI-VENTRICULAR IMPLANTABLE CARDIOVERTER DEFIBRILLATOR Left 01/20/2011   Procedure: BI-VENTRICULAR IMPLANTABLE CARDIOVERTER DEFIBRILLATOR  (CRT-D);  Surgeon: Danelle LELON Birmingham, MD;  Location: [facility] CATH LAB;  Service: Cardiovascular;  Laterality: Left;   BIV ICD GENERATOR CHANGEOUT N/A 11/24/2016   Procedure: BIV ICD GENERATOR CHANGEOUT;  Surgeon: Birmingham Danelle LELON, MD;  Location: [facility] INVASIVE CV LAB;  Service: Cardiovascular;  Laterality: N/A;   BREAST LUMPECTOMY WITH RADIOACTIVE SEED LOCALIZATION Left 01/22/2020   Procedure: LEFT BREAST LUMPECTOMY WITH RADIOACTIVE SEED LOCALIZATION;  Surgeon: Belinda Cough, MD;  Location: [facility];  Service: General;  Laterality: Left;  LMA   BREAST SURGERY     CERVICAL ABLATION  03/29/2023   CHOLECYSTECTOMY  ~1996   FOOT NEUROMA SURGERY  ~ 2003   right   RE-EXCISION OF BREAST LUMPECTOMY Left 03/03/2020   Procedure: RE-EXCISION MARGINS OF LEFT BREAST LUMPECTOMY;  Surgeon: Belinda Cough, MD;  Location: [facility];  Service: General;  Laterality: Left;   ROTATOR CUFF REPAIR  ~ 2009   left   SHOULDER ARTHROSCOPY W/ ROTATOR CUFF REPAIR Right 09/21/2011   SKIN CANCER EXCISION     nose, forehead, left leg   SPINE SURGERY     TEE WITHOUT CARDIOVERSION N/A 03/01/2017   Procedure: TRANSESOPHAGEAL ECHOCARDIOGRAM (TEE);  Surgeon: Raford Riggs, MD;  Location: [facility] ENDOSCOPY;  Service: Cardiovascular;  Laterality: N/A;   THYROID  LOBECTOMY  ~ 2006   right   TUBAL LIGATION  ~ 1983   VAGINAL HYSTERECTOMY  ~ 2009   VESICOVAGINAL FISTULA CLOSURE W/ TAH     Social History   Occupational History   Occupation: Runner, broadcasting/film/video    Comment: n/a  Tobacco Use   Smoking status: Never    Passive exposure: Never   Smokeless tobacco: Never  Vaping Use   Vaping status: Never Used  Substance and Sexual Activity   Alcohol  use: No   Drug use: No   Sexual activity: Yes

## 2023-10-10 ENCOUNTER — Ambulatory Visit (HOSPITAL_BASED_OUTPATIENT_CLINIC_OR_DEPARTMENT_OTHER): Admitting: Family Medicine

## 2023-10-10 DIAGNOSIS — M542 Cervicalgia: Secondary | ICD-10-CM | POA: Diagnosis not present

## 2023-10-12 ENCOUNTER — Other Ambulatory Visit: Payer: Self-pay | Admitting: Neurological Surgery

## 2023-10-12 DIAGNOSIS — M542 Cervicalgia: Secondary | ICD-10-CM

## 2023-10-17 ENCOUNTER — Ambulatory Visit: Admitting: Orthopedic Surgery

## 2023-10-17 DIAGNOSIS — I87332 Chronic venous hypertension (idiopathic) with ulcer and inflammation of left lower extremity: Secondary | ICD-10-CM

## 2023-10-18 ENCOUNTER — Encounter: Payer: Self-pay | Admitting: Orthopedic Surgery

## 2023-10-18 ENCOUNTER — Other Ambulatory Visit (HOSPITAL_BASED_OUTPATIENT_CLINIC_OR_DEPARTMENT_OTHER): Payer: Self-pay | Admitting: Family Medicine

## 2023-10-18 NOTE — Progress Notes (Signed)
 Office Visit Note   Patient: [name]           Date of Birth: [birth year]           MRN: [id number] Visit Date: 10/17/2023              Requested by: Jerrell Cleatus Ned, MD [address] [place],  [place] [address] PCP: Jerrell Cleatus Ned, MD  Chief Complaint  Patient presents with   Left Leg - Follow-up      HPI: Discussed the use of AI scribe software for clinical note transcription with the patient, who gave verbal consent to proceed.  History of Present Illness Jewel ARYIA DELIRA is a 68 year old female who presents for follow-up after healing of a venous ulcer.  She is currently using Vivewear compression socks to maintain skin integrity, changing them twice daily. She inquires about the need for a bandage if the skin breaks open again and is informed that the medicated sock can be used directly on the skin if needed.  She discusses the durability and color options of the socks, noting that they are available in light gray, oatmeal, and black. She is interested in additional colors but understands the production limitations. She mentions that the socks are made in [place] by a local company.     Assessment & Plan: Visit Diagnoses: No diagnosis found.  Plan: Assessment and Plan Assessment & Plan Healed venous ulcer of lower extremity Venous ulcer healed. Skin remains fragile. - Discontinued Vosh dressing changes. - Continue Vivewear compression socks. - Apply medicated sock if ulcer recurs. - Follow up PRN for ulcer recurrence or concerns.      Follow-Up Instructions: No follow-ups on file.   Ortho Exam  Patient is alert, oriented, no adenopathy, well-dressed, normal affect, normal respiratory effort. Physical Exam SKIN: Venous ulcer completely healed, no cellulitis, no drainage.      Imaging: No results found.   Labs: Lab Results  Component Value Date   ESRSEDRATE 4 01/09/2023   ESRSEDRATE 18 04/07/2021   ESRSEDRATE 55 (H)  02/12/2010   CRP 6 01/09/2023   REPTSTATUS 03/02/2017 FINAL 02/25/2017   GRAMSTAIN  02/09/2010    RARE WBC PRESENT,BOTH PMN AND MONONUCLEAR NO ORGANISMS SEEN   CULT  02/25/2017    NO GROWTH 5 DAYS Performed at [facility], [address] [place], [place] [address]      Lab Results  Component Value Date   ALBUMIN  4.0 02/04/2021   ALBUMIN  3.2 (L) 02/27/2017   ALBUMIN  3.9 02/25/2017    Lab Results  Component Value Date   MG 1.7 02/12/2010   MG 1.6 02/11/2010   MG 2.0 02/09/2010   Lab Results  Component Value Date   VD25OH CANCELED 04/24/2023    No results found for: PREALBUMIN    Latest Ref Rng & Units 09/16/2023    8:56 PM 01/09/2023    2:52 PM 08/17/2022    3:28 PM  CBC EXTENDED  WBC 4.0 - 10.5 K/uL 9.1  9.6  9.4   RBC 3.87 - 5.11 MIL/uL 4.48  4.70  4.70   Hemoglobin 12.0 - 15.0 g/dL 86.2  85.9  85.9   HCT 36.0 - 46.0 % 39.9  43.1  42.4   Platelets 150 - 400 K/uL 301  356  374   NEUT# 1.7 - 7.7 K/uL 6.1  5.6  5.1   Lymph# 0.7 - 4.0 K/uL 1.9  2.4  2.4  There is no height or weight on file to calculate BMI.  Orders:  No orders of the defined types were placed in this encounter.  No orders of the defined types were placed in this encounter.    Procedures: No procedures performed  Clinical Data: No additional findings.  ROS:  All other systems negative, except as noted in the HPI. Review of Systems  Objective: Vital Signs: There were no vitals taken for this visit.  Specialty Comments:  No specialty comments available.  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   Biventricular cardiac pacemaker in situ 10/08/2018   Long term current use of anticoagulant therapy 10/23/2017   Trigeminal neuralgia 12/14/2012   AICD (automatic cardioverter/defibrillator) present    Aortic regurgitation    Nonischemic cardiomyopathy (HCC) 01/21/2011   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 prior CRT-D in 2013 with downgrade to CRT-Pacemaker only in 11/2016.   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 2012: normal coronaries, EF 25-30%. b. improved to  55% 11/2016.   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    Family History  Problem Relation Age of Onset   Heart disease Mother    Heart failure Mother 44       CHF   Stroke Mother    Hypertension Mother  Varicose Veins Mother    Heart disease Father 68   Macular degeneration Father    Hearing loss Father    Vision loss Father    Heart attack Sister    Depression Sister    Heart disease Sister    Varicose Veins Sister    Atrial fibrillation Sister    Heart disease Sister    Arthritis Sister     Past Surgical History:  Procedure Laterality Date   ARTERY BIOPSY  12/29/2011   Procedure: MINOR BIOPSY TEMPORAL ARTERY;  Surgeon: Morene ONEIDA Olives, MD;  Location: [facility];  Service: General;  Laterality: Right;  Right temporal artery biopsy   BACK SURGERY  12/31/2009   for flat back syndrome; fused bottom of my back   BI-VENTRICULAR IMPLANTABLE CARDIOVERTER DEFIBRILLATOR Left 01/20/2011   Procedure: BI-VENTRICULAR IMPLANTABLE CARDIOVERTER DEFIBRILLATOR  (CRT-D);  Surgeon: Danelle LELON Birmingham, MD;  Location: [facility] CATH LAB;  Service: Cardiovascular;  Laterality: Left;   BIV ICD GENERATOR CHANGEOUT N/A 11/24/2016   Procedure: BIV ICD GENERATOR CHANGEOUT;  Surgeon: Birmingham Danelle LELON, MD;  Location: [facility] INVASIVE CV LAB;  Service: Cardiovascular;  Laterality: N/A;   BREAST LUMPECTOMY WITH RADIOACTIVE SEED LOCALIZATION Left 01/22/2020   Procedure: LEFT BREAST LUMPECTOMY WITH RADIOACTIVE SEED LOCALIZATION;  Surgeon: Belinda Cough, MD;  Location: [facility];  Service: General;  Laterality: Left;  LMA   BREAST SURGERY     CERVICAL ABLATION  03/29/2023    CHOLECYSTECTOMY  ~1996   FOOT NEUROMA SURGERY  ~ 2003   right   RE-EXCISION OF BREAST LUMPECTOMY Left 03/03/2020   Procedure: RE-EXCISION MARGINS OF LEFT BREAST LUMPECTOMY;  Surgeon: Belinda Cough, MD;  Location: [facility];  Service: General;  Laterality: Left;   ROTATOR CUFF REPAIR  ~ 2009   left   SHOULDER ARTHROSCOPY W/ ROTATOR CUFF REPAIR Right 09/21/2011   SKIN CANCER EXCISION     nose, forehead, left leg   SPINE SURGERY     TEE WITHOUT CARDIOVERSION N/A 03/01/2017   Procedure: TRANSESOPHAGEAL ECHOCARDIOGRAM (TEE);  Surgeon: Raford Riggs, MD;  Location: [facility] ENDOSCOPY;  Service: Cardiovascular;  Laterality: N/A;   THYROID  LOBECTOMY  ~ 2006   right   TUBAL LIGATION  ~ 1983   VAGINAL HYSTERECTOMY  ~ 2009   VESICOVAGINAL FISTULA CLOSURE W/ TAH     Social History   Occupational History   Occupation: Runner, broadcasting/film/video    Comment: n/a  Tobacco Use   Smoking status: Never    Passive exposure: Never   Smokeless tobacco: Never  Vaping Use   Vaping status: Never Used  Substance and Sexual Activity   Alcohol  use: No   Drug use: No   Sexual activity: Yes

## 2023-10-19 ENCOUNTER — Telehealth: Payer: Self-pay | Admitting: Student in an Organized Health Care Education/Training Program

## 2023-10-19 NOTE — Telephone Encounter (Addendum)
 Home Health Certification or Plan of Care Tracking  Is this a Certification or Plan of Care? Plan of Care  [facility] Agency: [facility]  Order Number:  [id number], [id number] (paper clipped together)  Has charge sheet been attached? Yes  Where has form been placed:  Labeled & placed in provider bin

## 2023-10-20 NOTE — Telephone Encounter (Signed)
Placed on pcp desk for signature.

## 2023-10-20 NOTE — Telephone Encounter (Signed)
 Faxed back.

## 2023-10-20 NOTE — Telephone Encounter (Signed)
 Signed and placed in MA basket.

## 2023-10-27 ENCOUNTER — Encounter: Payer: Self-pay | Admitting: Student in an Organized Health Care Education/Training Program

## 2023-10-27 ENCOUNTER — Ambulatory Visit: Admitting: Student in an Organized Health Care Education/Training Program

## 2023-10-27 VITALS — BP 101/61 | HR 54 | Wt 162.0 lb

## 2023-10-27 DIAGNOSIS — F411 Generalized anxiety disorder: Secondary | ICD-10-CM | POA: Diagnosis not present

## 2023-10-27 DIAGNOSIS — E890 Postprocedural hypothyroidism: Secondary | ICD-10-CM | POA: Diagnosis not present

## 2023-10-27 DIAGNOSIS — G250 Essential tremor: Secondary | ICD-10-CM

## 2023-10-27 MED ORDER — TRAZODONE HCL 50 MG PO TABS: 25.0000 mg | ORAL_TABLET | Freq: Every evening | ORAL | 3 refills | Status: DC | PRN

## 2023-10-27 MED ORDER — LEVOTHYROXINE SODIUM 100 MCG PO TABS: 100.0000 ug | ORAL_TABLET | Freq: Every day | ORAL | 0 refills | Status: DC

## 2023-10-27 MED ORDER — ALBUTEROL SULFATE HFA 108 (90 BASE) MCG/ACT IN AERS: 2.0000 | INHALATION_SPRAY | Freq: Four times a day (QID) | RESPIRATORY_TRACT | 2 refills | Status: AC | PRN

## 2023-10-27 NOTE — Assessment & Plan Note (Signed)
 Reports a history of an essential tremor managed with a neurologist, using propranolol  with good effect.  On exam today I noticed some features that could be consistent with parkinsonism.  No issues with dementia.  No resting tremor.  She does have a masked face and some mild cogwheel rigidity, and some changes in her gait.  Plan bring her back in 1 month and have a more dedicated appointment to evaluate the risk of Parkinson's or other related conditions.  She does not have follow-up with her neurologist planned soon, but I think that would be a good idea.

## 2023-10-27 NOTE — Progress Notes (Signed)
 Established Patient Office Visit  Subjective   Patient ID: [name], female    DOB: 21-Jan-1955  Age: 68 y.o. MRN: [id number]  Chief Complaint  Patient presents with   Cellulitis    4 week follow up for left leg cellulitis     HPI  Discussed the use of AI scribe software for clinical note transcription with the patient, who gave verbal consent to proceed.  History of Present Illness [name] is a 68 year old female who presents with concerns about her current medication regimen.  She experiences ongoing anxiety and sleep disturbances despite being on Wellbutrin  for the past six to seven months. Her anxiety is related to concerns about her son's living situation and the responsibilities of caring for her sister and her sister's roommate, who has Alzheimer's. She has difficulty sleeping, often staying awake until 3 AM, and uses Bonine, an over-the-counter motion sickness medication, to aid sleep. She wants to discontinue Wellbutrin , as she feels it is not effective.  She has a history of using Xanax , prescribed after a neck ablation procedure that resulted in significant pain and difficulty holding her head up. She uses Xanax  infrequently, about once or twice a month, and is concerned about the potential risks associated with its use.  She reports having neuropathy, which she attributes to her back issues rather than diabetes or kidney problems. She uses gabapentin , 600 mg twice daily, but has adjusted her dosage to one in the morning and one at night. Wearing compression socks daily helps alleviate foot pain.  Her past medical history includes breast cancer, for which she is monitored annually, and atrial fibrillation, for which she has a pacemaker. She is under the care of multiple specialists, including a pain specialist, a cardiologist, and an oncologist. She takes several medications, including anastrozole , Synthroid , propranolol , and spironolactone , and has a history of  using Cymbalta  for stress without benefit.  She is concerned about her weight, noting that her cardiologist prefers her to weigh no more than 150 pounds. She currently weighs 155 pounds and is interested in weight loss strategies.  She reports a history of physical therapy last summer for stiffness and is concerned about her risk of falls. She tries to walk her dog daily and has not experienced any recent falls. She has a history of neck issues and is working with a neurologist to strengthen her neck for potential surgery.     Objective:     BP 101/61   Pulse (!) 54   Wt 162 lb (73.5 kg)   BMI 25.00 kg/m   Physical Exam  Gen: Well-appearing with Neuro: Alert, conversational, masked face with reduced expression, mild cogwheel rigidity noted in both arms, no resting pill-rolling tremor, gait is wide-based, decreased arm swing, she is slow to get up and go, one-person assistance to get on the table, full strength upper and lower extremities Neck: Normal thyroid , no nodules or adenopathy Heart: Regular, no murmur Lungs: Unlabored, clear throughout Ext: Warm and well-perfused, compression stockings are in place, there is no edema today. Skin: The cellulitis on her left anterior leg is resolving nicely, She has chronic stasis changes in both lower extremities    Assessment & Plan:    Problem List Items Addressed This Visit       Medium    Benign essential tremor (Chronic)   Reports a history of an essential tremor managed with a neurologist, using propranolol  with good effect.  On exam today I noticed some  features that could be consistent with parkinsonism.  No issues with dementia.  No resting tremor.  She does have a masked face and some mild cogwheel rigidity, and some changes in her gait.  Plan bring her back in 1 month and have a more dedicated appointment to evaluate the risk of Parkinson's or other related conditions.  She does not have follow-up with her neurologist planned  soon, but I think that would be a good idea.      Generalized anxiety disorder - Primary (Chronic)   Chronic anxiety and insomnia are worsened by family stressors, and Wellbutrin  is ineffective. Discontinue Wellbutrin  and prescribed trazodone 25 mg before bedtime. Educated on sleep hygiene: avoid screens before bed, no caffeine after noon, regular exercise, and avoid naps. Stop using meclizine for sleep due to high risk in older adults.      Relevant Medications   traZODone (DESYREL) 50 MG tablet   Post-surgical hypothyroidism (Chronic)   Symptomatically doing well, no symptoms of hyper or hypothyroidism.  I have refilled her Synthroid .      Relevant Medications   levothyroxine  (SYNTHROID ) 100 MCG tablet    Return in about 4 weeks (around 11/24/2023) for Parkinson eval.    Cleatus Debby Specking, MD

## 2023-10-27 NOTE — Assessment & Plan Note (Signed)
 Symptomatically doing well, no symptoms of hyper or hypothyroidism.  I have refilled her Synthroid .

## 2023-10-27 NOTE — Assessment & Plan Note (Signed)
 Chronic anxiety and insomnia are worsened by family stressors, and Wellbutrin  is ineffective. Discontinue Wellbutrin  and prescribed trazodone 25 mg before bedtime. Educated on sleep hygiene: avoid screens before bed, no caffeine after noon, regular exercise, and avoid naps. Stop using meclizine for sleep due to high risk in older adults.

## 2023-10-27 NOTE — Patient Instructions (Signed)
  VISIT SUMMARY: During your visit, we discussed your ongoing anxiety, sleep disturbances, and concerns about your current medication regimen. We reviewed your history of breast cancer, atrial fibrillation, peripheral neuropathy, essential tremor, hypothyroidism, chronic neck and back pain, and osteoporosis. We also addressed your weight concerns and risk of falls.  YOUR PLAN: -ANXIETY AND INSOMNIA: Your chronic anxiety and insomnia are likely worsened by family stressors. We will discontinue Wellbutrin  and start you on trazodone 25 mg before bedtime to help with sleep. Please follow sleep hygiene practices: avoid screens before bed, no caffeine after noon, regular exercise, and avoid naps. Stop using meclizine for sleep as it is not safe for older adults.  -PERIPHERAL NEUROPATHY: Peripheral neuropathy, likely related to previous radiation therapy, causes pain and discomfort in your feet. Continue taking gabapentin  600 mg twice daily to manage this condition.  -ESSENTIAL TREMOR: Essential tremor is a condition that causes involuntary shaking. It is effectively managed with propranolol , so please continue taking it as prescribed.  -ATRIAL FIBRILLATION AND STATUS POST PACEMAKER PLACEMENT: Atrial fibrillation is an irregular heart rhythm. Continue your follow-up with your cardiologist to manage this condition.  -BREAST CANCER, DCIS, ON ANASTROZOLE : DCIS is a type of breast cancer. Continue taking anastrozole  for two more years and have annual follow-ups with your oncologist.  -HYPOTHYROIDISM: Hypothyroidism is a condition where your thyroid  does not produce enough hormones. Continue taking Synthroid  as prescribed.  -CHRONIC NECK AND BACK PAIN: Chronic neck and back pain are ongoing issues. Continue your follow-up with your pain specialist and consider physical therapy if needed.  -OSTEOPOROSIS, ON EVENITY : Osteoporosis is a condition that weakens bones. Continue your monthly Evenity  injections to  manage this condition.  INSTRUCTIONS: Please follow up with your cardiologist for atrial fibrillation management and your oncologist for annual breast cancer monitoring. Continue your current medications as discussed, and follow the new plan for anxiety and insomnia. If you have any concerns or experience any side effects, please contact our office.

## 2023-11-01 ENCOUNTER — Ambulatory Visit: Admitting: Urology

## 2023-11-01 VITALS — BP 106/67 | HR 50 | Ht 67.0 in | Wt 162.0 lb

## 2023-11-01 DIAGNOSIS — N952 Postmenopausal atrophic vaginitis: Secondary | ICD-10-CM

## 2023-11-01 DIAGNOSIS — Z87440 Personal history of urinary (tract) infections: Secondary | ICD-10-CM

## 2023-11-01 DIAGNOSIS — N390 Urinary tract infection, site not specified: Secondary | ICD-10-CM

## 2023-11-01 DIAGNOSIS — R300 Dysuria: Secondary | ICD-10-CM

## 2023-11-01 LAB — MICROSCOPIC EXAMINATION: Bacteria, UA: NONE SEEN

## 2023-11-01 LAB — URINALYSIS, ROUTINE W REFLEX MICROSCOPIC
Bilirubin, UA: NEGATIVE
Glucose, UA: NEGATIVE
Ketones, UA: NEGATIVE
Leukocytes,UA: NEGATIVE
Nitrite, UA: NEGATIVE
Protein,UA: NEGATIVE
Specific Gravity, UA: 1.025 (ref 1.005–1.030)
Urobilinogen, Ur: 0.2 mg/dL (ref 0.2–1.0)
pH, UA: 5.5 (ref 5.0–7.5)

## 2023-11-01 NOTE — Progress Notes (Signed)
 Assessment: - Recurrent urinary tract infections.  Doing well, on cream  -Vaginal atrophic changes.  On perivaginal estrogen cream.  Tolerating this well.  Plan: -She will continue the estrogen cream 2 nights a week  - As she is doing well from urologic standpoint I will have her come back as needed.  She will get refills on Estrace  from Dr. Jerrell  History of Present Illness: 7.28.2025: Initial visit for evaluation of dysuria.  She states that she has been treated for 2 or 3 urinary tract infections for the past few months.  Typically, she just has mild burning, frequency and urgency.  Sometimes, just the dysuria and vaginal sensitivity and no lower urinary tract symptoms.  She is sexually active.  She has been on Macrodantin  suppression in the past, currently she is not on this.  She has somewhat of a slow stream.  Usually feels like she empties well.  No real issues with leakage.  No urgency.  No history of febrile UTIs or hematuria.  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  She had lumpectomy over 3 years ago.  She is on anastrozole .  With oncologist authorization, she was started on perivaginal estrogen cream.  She was also started on probiotic and cranberry capsules.  10.22.2025: Tolerating Estrace  cream well.  No infections since her last visit.   Past Medical History:  Past Medical History:  Diagnosis Date   AF (paroxysmal atrial fibrillation) (HCC)    Allergy    Anxiety    Arthritis    BCC (basal cell carcinoma) 03/11/2003   left distal lower leg ankle-tx dr helga   Biventricular cardiac pacemaker in situ    a. prior CRT-D in 2013 with downgrade to CRT-Pacemaker only in 11/2016.   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 2012: normal coronaries, EF 25-30%. b. improved to 55% 11/2016.   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    Past Surgical History:  Past Surgical History:  Procedure Laterality Date   ARTERY BIOPSY  12/29/2011   Procedure: MINOR BIOPSY TEMPORAL ARTERY;  Surgeon: Morene ONEIDA Olives, MD;  Location: [facility];  Service: General;  Laterality: Right;  Right temporal artery biopsy   BACK SURGERY  12/31/2009   for flat back syndrome; fused bottom of my back   BI-VENTRICULAR IMPLANTABLE CARDIOVERTER DEFIBRILLATOR Left 01/20/2011   Procedure: BI-VENTRICULAR IMPLANTABLE CARDIOVERTER DEFIBRILLATOR  (  CRT-D);  Surgeon: Danelle LELON Birmingham, MD;  Location: [facility] CATH LAB;  Service: Cardiovascular;  Laterality: Left;   BIV ICD GENERATOR CHANGEOUT N/A 11/24/2016   Procedure: BIV ICD GENERATOR CHANGEOUT;  Surgeon: Birmingham Danelle LELON, MD;  Location: [facility] INVASIVE CV LAB;  Service: Cardiovascular;  Laterality: N/A;   BREAST LUMPECTOMY WITH RADIOACTIVE SEED LOCALIZATION Left 01/22/2020   Procedure: LEFT BREAST LUMPECTOMY WITH RADIOACTIVE SEED LOCALIZATION;  Surgeon: Belinda Cough, MD;  Location: [facility];  Service: General;  Laterality: Left;  LMA   BREAST SURGERY      CERVICAL ABLATION  03/29/2023   CHOLECYSTECTOMY  ~1996   FOOT NEUROMA SURGERY  ~ 2003   right   RE-EXCISION OF BREAST LUMPECTOMY Left 03/03/2020   Procedure: RE-EXCISION MARGINS OF LEFT BREAST LUMPECTOMY;  Surgeon: Belinda Cough, MD;  Location: [facility];  Service: General;  Laterality: Left;   ROTATOR CUFF REPAIR  ~ 2009   left   SHOULDER ARTHROSCOPY W/ ROTATOR CUFF REPAIR Right 09/21/2011   SKIN CANCER EXCISION     nose, forehead, left leg   SPINE SURGERY     TEE WITHOUT CARDIOVERSION N/A 03/01/2017   Procedure: TRANSESOPHAGEAL ECHOCARDIOGRAM (TEE);  Surgeon: Raford Riggs, MD;  Location: [facility] ENDOSCOPY;  Service: Cardiovascular;  Laterality: N/A;   THYROID  LOBECTOMY  ~ 2006   right   TUBAL LIGATION  ~ 1983   VAGINAL HYSTERECTOMY  ~ 2009   VESICOVAGINAL FISTULA CLOSURE W/ TAH      Allergies:  Allergies  Allergen Reactions   Other Nausea And Vomiting and Other (See Comments)    Most narcotic pain meds cause N/V and CHEST PAIN; needs an antiemetic to tolerate  Also, patient was diagnosed with Raynaud's disease and was told to NOT place IV(s) in either hand because of poor circulation   Iodine Other (See Comments)    Blisters   Tape Other (See Comments)    PATIENT'S SKIN IS VERY THIN; IT TEARS, BRUISES, AND BLEEDS VERY EASILY (PLEASE USE COBAN WRAP, IF POSSIBLE!!)   Codeine Other (See Comments)    Chest pain   Fentanyl  Other (See Comments)    Bad dreams   Morphine  And Codeine Nausea And Vomiting    Family History:  Family History  Problem Relation Age of Onset   Heart disease Mother    Heart failure Mother 48       CHF   Stroke Mother    Hypertension Mother    Varicose Veins Mother    Heart disease Father 20   Macular degeneration Father    Hearing loss Father    Vision loss Father    Heart attack Sister    Depression Sister    Heart disease Sister    Varicose Veins Sister    Atrial fibrillation Sister    Heart disease Sister    Arthritis  Sister     Social History:  Social History   Tobacco Use   Smoking status: Never    Passive exposure: Never   Smokeless tobacco: Never  Vaping Use   Vaping status: Never Used  Substance Use Topics   Alcohol  use: No   Drug use: No      Results:  I have reviewed prior patient's records  I have reviewed referring/prior physicians records--PCP, oncology  I have reviewed urinalysis--clear again today  I have reviewed prior urine cultures--past 2 urine cultures were negative

## 2023-11-03 ENCOUNTER — Telehealth: Payer: Self-pay

## 2023-11-03 NOTE — Telephone Encounter (Signed)
 Per notes from [facility] and Dr.Vincent.  Per Burnard on 10/27/2023:   When I went to discharge the patient and give her the AVS, she stated that she forgot to ask you a question about stopping the Wellbutrin . Does she stop that all at once or does she need to taper it? I let her know that we would call her with that answer.   Per Dr.Vincent on 10/27/2023:  I recommend using a half tablet of wellbutrin  (150mg ) for 2 weeks, and then stop. Thanks for following up with her.   Called patient yesterday to relay this information and left a Vm for patient to return call.

## 2023-11-03 NOTE — Telephone Encounter (Signed)
 Called patient and relayed notes below,  Patient states she did not stop this completely yet and is starting on her second week and will stop completely after her second week

## 2023-11-03 NOTE — Telephone Encounter (Signed)
 Copied from CRM [id number]. Topic: General - Call Back - No Documentation >> Nov 02, 2023  4:41 PM Sophia H wrote: Reason for CRM: Patient states she is returning a missed phone call from [facility] ? No notes in chart and no answer at [facility]. Please reach out if you reached out to the patient. # [phone]

## 2023-11-06 ENCOUNTER — Ambulatory Visit: Admitting: Dermatology

## 2023-11-07 ENCOUNTER — Telehealth: Payer: Self-pay | Admitting: Student in an Organized Health Care Education/Training Program

## 2023-11-07 NOTE — Telephone Encounter (Signed)
 Obtained from front desk and placed on providers desk for review

## 2023-11-07 NOTE — Telephone Encounter (Signed)
 Signed and placed in MA basket.  Thank you.

## 2023-11-07 NOTE — Telephone Encounter (Signed)
 Home Health Certification or Plan of Care Tracking  Is this a Certification or Plan of Care? Plan of Care  [facility] Agency: [facility]  Order Number:  [id number]  Has charge sheet been attached? Yes  Where has form been placed:   Labeled & placed in provider bin

## 2023-11-08 ENCOUNTER — Telehealth: Payer: Self-pay | Admitting: Physician Assistant

## 2023-11-08 NOTE — Telephone Encounter (Signed)
Has been faxed back

## 2023-11-08 NOTE — Telephone Encounter (Signed)
 Patient called. She would like to know when her next injection will be.

## 2023-11-10 ENCOUNTER — Other Ambulatory Visit: Payer: Self-pay | Admitting: Physician Assistant

## 2023-11-10 DIAGNOSIS — M810 Age-related osteoporosis without current pathological fracture: Secondary | ICD-10-CM

## 2023-11-10 MED ORDER — ROMOSOZUMAB-AQQG 105 MG/1.17ML ~~LOC~~ SOSY: 210.0000 mg | PREFILLED_SYRINGE | SUBCUTANEOUS | Status: AC

## 2023-11-10 NOTE — Telephone Encounter (Signed)
 I called patient tried leave msg, it disconnected.  Will try to call back.   I pulled BV, and it shows patient will owe $40 copay if we buy and bill, $20 copay through [facility].   I cannot tell if auth done yet from chart review.  Will keep looking into that and speak with patient about options soon.  She is due her next Evenity .

## 2023-11-13 ENCOUNTER — Encounter: Payer: Self-pay | Admitting: Radiology

## 2023-11-13 ENCOUNTER — Emergency Department (HOSPITAL_BASED_OUTPATIENT_CLINIC_OR_DEPARTMENT_OTHER)

## 2023-11-13 ENCOUNTER — Ambulatory Visit

## 2023-11-13 ENCOUNTER — Ambulatory Visit (INDEPENDENT_AMBULATORY_CARE_PROVIDER_SITE_OTHER): Admitting: Physician Assistant

## 2023-11-13 ENCOUNTER — Other Ambulatory Visit: Payer: Self-pay

## 2023-11-13 ENCOUNTER — Observation Stay (HOSPITAL_BASED_OUTPATIENT_CLINIC_OR_DEPARTMENT_OTHER)
Admission: EM | Admit: 2023-11-13 | Discharge: 2023-11-16 | Disposition: A | Discharge status: Home / Self Care | Attending: Family Medicine | Admitting: Family Medicine

## 2023-11-13 ENCOUNTER — Encounter (HOSPITAL_BASED_OUTPATIENT_CLINIC_OR_DEPARTMENT_OTHER): Payer: Self-pay | Admitting: Emergency Medicine

## 2023-11-13 VITALS — Ht 67.0 in | Wt 162.0 lb

## 2023-11-13 DIAGNOSIS — E890 Postprocedural hypothyroidism: Secondary | ICD-10-CM | POA: Diagnosis present

## 2023-11-13 DIAGNOSIS — M25522 Pain in left elbow: Secondary | ICD-10-CM | POA: Diagnosis not present

## 2023-11-13 DIAGNOSIS — D62 Acute posthemorrhagic anemia: Secondary | ICD-10-CM | POA: Diagnosis present

## 2023-11-13 DIAGNOSIS — S5002XA Contusion of left elbow, initial encounter: Secondary | ICD-10-CM | POA: Diagnosis present

## 2023-11-13 DIAGNOSIS — M810 Age-related osteoporosis without current pathological fracture: Secondary | ICD-10-CM

## 2023-11-13 DIAGNOSIS — W010XXA Fall on same level from slipping, tripping and stumbling without subsequent striking against object, initial encounter: Secondary | ICD-10-CM | POA: Insufficient documentation

## 2023-11-13 DIAGNOSIS — I480 Paroxysmal atrial fibrillation: Secondary | ICD-10-CM | POA: Diagnosis present

## 2023-11-13 DIAGNOSIS — G250 Essential tremor: Secondary | ICD-10-CM | POA: Diagnosis present

## 2023-11-13 DIAGNOSIS — M8000XA Age-related osteoporosis with current pathological fracture, unspecified site, initial encounter for fracture: Secondary | ICD-10-CM | POA: Diagnosis not present

## 2023-11-13 DIAGNOSIS — M7981 Nontraumatic hematoma of soft tissue: Secondary | ICD-10-CM | POA: Diagnosis not present

## 2023-11-13 DIAGNOSIS — M542 Cervicalgia: Secondary | ICD-10-CM

## 2023-11-13 DIAGNOSIS — Z0000 Encounter for general adult medical examination without abnormal findings: Secondary | ICD-10-CM

## 2023-11-13 DIAGNOSIS — M4185 Other forms of scoliosis, thoracolumbar region: Secondary | ICD-10-CM | POA: Diagnosis not present

## 2023-11-13 DIAGNOSIS — M4856XA Collapsed vertebra, not elsewhere classified, lumbar region, initial encounter for fracture: Secondary | ICD-10-CM | POA: Diagnosis not present

## 2023-11-13 DIAGNOSIS — S0990XA Unspecified injury of head, initial encounter: Secondary | ICD-10-CM | POA: Diagnosis not present

## 2023-11-13 DIAGNOSIS — I110 Hypertensive heart disease with heart failure: Secondary | ICD-10-CM | POA: Insufficient documentation

## 2023-11-13 DIAGNOSIS — D0512 Intraductal carcinoma in situ of left breast: Secondary | ICD-10-CM | POA: Diagnosis present

## 2023-11-13 DIAGNOSIS — Z7901 Long term (current) use of anticoagulants: Secondary | ICD-10-CM | POA: Insufficient documentation

## 2023-11-13 DIAGNOSIS — I5022 Chronic systolic (congestive) heart failure: Secondary | ICD-10-CM | POA: Insufficient documentation

## 2023-11-13 DIAGNOSIS — S299XXA Unspecified injury of thorax, initial encounter: Secondary | ICD-10-CM | POA: Diagnosis not present

## 2023-11-13 DIAGNOSIS — S3993XA Unspecified injury of pelvis, initial encounter: Secondary | ICD-10-CM | POA: Diagnosis not present

## 2023-11-13 DIAGNOSIS — T148XXA Other injury of unspecified body region, initial encounter: Secondary | ICD-10-CM | POA: Diagnosis present

## 2023-11-13 DIAGNOSIS — G2581 Restless legs syndrome: Secondary | ICD-10-CM

## 2023-11-13 DIAGNOSIS — S199XXA Unspecified injury of neck, initial encounter: Secondary | ICD-10-CM | POA: Diagnosis not present

## 2023-11-13 DIAGNOSIS — S3013XA Contusion of flank (latus) region, initial encounter: Principal | ICD-10-CM | POA: Insufficient documentation

## 2023-11-13 DIAGNOSIS — S3991XA Unspecified injury of abdomen, initial encounter: Secondary | ICD-10-CM | POA: Diagnosis not present

## 2023-11-13 LAB — CBC WITH DIFFERENTIAL/PLATELET
Abs Immature Granulocytes: 0.03 K/uL (ref 0.00–0.07)
Basophils Absolute: 0.1 K/uL (ref 0.0–0.1)
Basophils Relative: 1 %
Eosinophils Absolute: 0.2 K/uL (ref 0.0–0.5)
Eosinophils Relative: 1 %
HCT: 40.1 % (ref 36.0–46.0)
Hemoglobin: 13.5 g/dL (ref 12.0–15.0)
Immature Granulocytes: 0 %
Lymphocytes Relative: 18 %
Lymphs Abs: 2.5 K/uL (ref 0.7–4.0)
MCH: 30.3 pg (ref 26.0–34.0)
MCHC: 33.7 g/dL (ref 30.0–36.0)
MCV: 89.9 fL (ref 80.0–100.0)
Monocytes Absolute: 1.2 K/uL — ABNORMAL HIGH (ref 0.1–1.0)
Monocytes Relative: 9 %
Neutro Abs: 10 K/uL — ABNORMAL HIGH (ref 1.7–7.7)
Neutrophils Relative %: 71 %
Platelets: 306 K/uL (ref 150–400)
RBC: 4.46 MIL/uL (ref 3.87–5.11)
RDW: 12.4 % (ref 11.5–15.5)
WBC: 14.1 K/uL — ABNORMAL HIGH (ref 4.0–10.5)
nRBC: 0 % (ref 0.0–0.2)

## 2023-11-13 LAB — COMPREHENSIVE METABOLIC PANEL WITH GFR
ALT: 20 U/L (ref 0–44)
AST: 27 U/L (ref 15–41)
Albumin: 4.1 g/dL (ref 3.5–5.0)
Alkaline Phosphatase: 89 U/L (ref 38–126)
Anion gap: 8 (ref 5–15)
BUN: 23 mg/dL (ref 8–23)
CO2: 27 mmol/L (ref 22–32)
Calcium: 9.4 mg/dL (ref 8.9–10.3)
Chloride: 106 mmol/L (ref 98–111)
Creatinine, Ser: 1.12 mg/dL — ABNORMAL HIGH (ref 0.44–1.00)
GFR, Estimated: 53 mL/min — ABNORMAL LOW (ref >60)
Glucose, Bld: 107 mg/dL — ABNORMAL HIGH (ref 70–99)
Potassium: 3.9 mmol/L (ref 3.5–5.1)
Sodium: 141 mmol/L (ref 135–145)
Total Bilirubin: 0.3 mg/dL (ref 0.0–1.2)
Total Protein: 6.4 g/dL — ABNORMAL LOW (ref 6.5–8.1)

## 2023-11-13 MED ORDER — ROMOSOZUMAB-AQQG 105 MG/1.17ML ~~LOC~~ SOSY: 210.0000 mg | PREFILLED_SYRINGE | SUBCUTANEOUS | Status: AC

## 2023-11-13 MED ORDER — IOHEXOL 300 MG/ML  SOLN
100.0000 mL | Freq: Once | INTRAMUSCULAR | Status: AC | PRN
  Administered 2023-11-13: 100 mL via INTRAVENOUS

## 2023-11-13 MED ORDER — HYDROCODONE-ACETAMINOPHEN 5-325 MG PO TABS
0.5000 | ORAL_TABLET | Freq: Once | ORAL | Status: AC
  Administered 2023-11-13: 0.5 via ORAL
  Filled 2023-11-13: qty 1

## 2023-11-13 NOTE — ED Provider Notes (Signed)
  EMERGENCY DEPARTMENT AT [facility] Provider Note   CSN: [id number] Arrival date & time: 11/13/23  2115     Patient presents with: Fall   [name] is a 68 y.o. female.   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  Patient reports that her dog had vomited on the floor and she was bending over to try to clean it up.  However because of her prior surgeries and stiffness she has a hard time bending over to the floor so she was in a weird position when she lost her balance causing her to fall backwards hitting her right lower back on a quartz countertop and then spinning and falling to the floor onto her left side and hitting her face and head on the floor.  She had no loss of consciousness.  This happened around 4:35.  She reports she had to lay there for a while but then her husband was able to get her up.  She has had significant pain in her right back but also has pain in her abdomen, left elbow, head and neck since this occurred.  She has been compliant with her Eliquis  and took her last dose this morning.  The history is provided by the patient.  Fall       Prior to Admission medications   Medication Sig Start Date End Date Taking? Authorizing Provider  albuterol  (VENTOLIN  HFA) 108 (90 Base) MCG/ACT inhaler Inhale 2 puffs into the lungs every 6 (six) hours as needed for wheezing or shortness of breath. 10/27/23   Jerrell Cleatus Ned, MD  anastrozole  (ARIMIDEX ) 1 MG tablet Take 1 tablet (1 mg total) by mouth daily. 09/04/23   Cloretta Arley NOVAK, MD  apixaban  (ELIQUIS ) 5 MG TABS tablet TAKE 1 TABLET TWICE DAILY 08/28/23   Lonni Slain, MD  eletriptan  (RELPAX ) 40 MG tablet Take 1 tablet (40 mg total) by mouth every 2 (two) hours as needed for migraine or headache. May repeat in 2 hours  if headache persists or recurs. 09/12/23   Caudle, Thersia Bitters, FNP  gabapentin  (NEURONTIN ) 600 MG tablet Take 600 mg by mouth 2 (two) times daily.    [provider]  levothyroxine  (SYNTHROID ) 100 MCG tablet Take 1 tablet (100 mcg total) by mouth daily before breakfast. 10/27/23   Jerrell Cleatus Ned, MD  omeprazole  (PRILOSEC) 40 MG capsule Take 1 capsule (40 mg total) by mouth daily. 08/10/23   Caudle, Thersia Bitters, FNP  propranolol  (INDERAL ) 60 MG tablet Take 1 tablet (60 mg total) by mouth daily. 11/10/20   Leverne Charlies Helling, PA-C  spironolactone  (ALDACTONE ) 25 MG tablet Take 0.5 tablets (12.5 mg total) by mouth daily. 02/23/23   Waddell Danelle ORN, MD  traZODone (DESYREL) 50 MG tablet Take 0.5 tablets (25 mg total) by mouth at bedtime as needed for sleep. 10/27/23   Jerrell Cleatus Ned, MD    Allergies: Other, Iodine, Tape, Codeine, Fentanyl , and Morphine  and codeine    Review of Systems  Updated Vital Signs BP 134/61   Pulse 65   SpO2 100%   Physical Exam Vitals and nursing note reviewed.  Constitutional:      General: She is in acute distress.     Appearance: She is well-developed.  HENT:     Head: Normocephalic and atraumatic.  Eyes:     Pupils: Pupils are  equal, round, and reactive to light.  Neck:     Comments: Limited range of motion due to prior neck surgeries.  Mild midline tenderness Cardiovascular:     Rate and Rhythm: Normal rate and regular rhythm.     Pulses: Normal pulses.     Heart sounds: Normal heart sounds. No murmur heard.    No friction rub.  Pulmonary:     Effort: Pulmonary effort is normal.     Breath sounds: Normal breath sounds. No wheezing or rales.  Abdominal:     General: Bowel sounds are normal. There is no distension.     Palpations: Abdomen is soft.     Tenderness: There is abdominal tenderness in the right lower quadrant and left lower quadrant. There is right CVA tenderness. There is no left CVA tenderness, guarding or rebound.   Musculoskeletal:        General: Tenderness present. Normal range of motion.     Left elbow: Swelling present. Normal range of motion. Tenderness present in radial head.       Arms:     Cervical back: Spinous process tenderness and muscular tenderness present.     Comments: No edema.  Large hematoma that is tender to the touch.  Mild midline tenderness  Skin:    General: Skin is warm and dry.     Findings: No rash.  Neurological:     Mental Status: She is alert and oriented to person, place, and time. Mental status is at baseline.     Cranial Nerves: No cranial nerve deficit.     Sensory: No sensory deficit.     Motor: No weakness.  Psychiatric:        Mood and Affect: Mood normal.        Behavior: Behavior normal.     (all labs ordered are listed, but only abnormal results are displayed) Labs Reviewed  CBC WITH DIFFERENTIAL/PLATELET - Abnormal; Notable for the following components:      Result Value   WBC 14.1 (*)    Neutro Abs 10.0 (*)    Monocytes Absolute 1.2 (*)    All other components within normal limits  COMPREHENSIVE METABOLIC PANEL WITH GFR    EKG: None  Radiology: No results found.   Procedures   Medications Ordered in the ED - No data to display                                  Medical Decision Making Amount and/or Complexity of Data Reviewed Radiology: ordered.   Pt with multiple medical problems and comorbidities and presenting today with a complaint that caries a high risk for morbidity and mortality.  Here today with the above complaints after a fall.  Patient does have a large hematoma to the right side of her back but also has some mild abdominal tenderness with palpation in the right and left lower quadrant.  Given the fall and being on anticoagulation we will do imaging of the head, cervical spine, abdomen and pelvis.  She has no chest pain or shortness of breath at this time.  Neurologically intact at this time with normal pulses in the  extremities.      Final diagnoses:  None    ED Discharge Orders     None          Doretha Folks, MD 11/13/23 2318

## 2023-11-13 NOTE — Patient Instructions (Signed)
 [name],  Next office visit on 11/27/23.  You are due for a mammogram, please remember to call and set that up.  You are also due for a shingles vaccine, which can be given at your local pharmacy.    Thank you for taking the time for your Medicare Wellness Visit. I appreciate your continued commitment to your health goals. Please review the care plan we discussed, and feel free to reach out if I can assist you further.  Please note that Annual Wellness Visits do not include a physical exam. Some assessments may be limited, especially if the visit was conducted virtually. If needed, we may recommend an in-person follow-up with your provider.  Ongoing Care Seeing your primary care provider every 3 to 6 months helps us  monitor your health and provide consistent, personalized care.   Referrals If a referral was made during today's visit and you haven't received any updates within two weeks, please contact the referred provider directly to check on the status.  Recommended Screenings:  Health Maintenance  Topic Date Due   Hepatitis C Screening  Never done   Zoster (Shingles) Vaccine (1 of 2) 10/19/1972   Breast Cancer Screening  12/26/2023   COVID-19 Vaccine (7 - 2025-26 season) 04/26/2024*   DEXA scan (bone density measurement)  03/09/2024   Colon Cancer Screening  04/06/2024   Medicare Annual Wellness Visit  11/12/2024   DTaP/Tdap/Td vaccine (4 - Td or Tdap) 01/13/2033   Pneumococcal Vaccine for age over 40  Completed   Flu Shot  Completed   Meningitis B Vaccine  Aged Out  *Topic was postponed. The date shown is not the original due date.       11/13/2023    3:05 PM  Advanced Directives  Does Patient Have a Medical Advance Directive? Yes  Type of Advance Directive Living will;Healthcare Power of Attorney    Vision: Annual vision screenings are recommended for early detection of glaucoma, cataracts, and diabetic retinopathy. These exams can also reveal signs of chronic  conditions such as diabetes and high blood pressure.  Dental: Annual dental screenings help detect early signs of oral cancer, gum disease, and other conditions linked to overall health, including heart disease and diabetes.  Please see the attached documents for additional preventive care recommendations.

## 2023-11-13 NOTE — Addendum Note (Signed)
 Addended by: Corgan Mormile on: 11/13/2023 11:23 AM   Modules accepted: Orders

## 2023-11-13 NOTE — Telephone Encounter (Signed)
 Patient came in today and received Evenity  in clinic, buy and bill, $40 copay.  Will look at sending her next order through the specialty pharmacy at $20 copay.

## 2023-11-13 NOTE — Progress Notes (Signed)
 Office Visit Note   Patient: [name]           Date of Birth: 15-Apr-1955           MRN: [id number] Visit Date: 11/13/2023              Requested by: Jerrell Cleatus Ned, MD [id number] [place],  [place] [address] PCP: Jerrell Cleatus Ned, MD  Chief Complaint  Patient presents with   Osteoporosis    Evenity  Injection      HPI: Patient is a pleasant 68 year old woman who comes in for her fifth and Evenity  injection.  She has been tolerating the injections well denies any paresthesias any muscle cramping.  She is scheduled to undergo a MRI of her neck in December.  She is pending surgery on her neck which has been delayed because of her poor bone quality.  Has maintained good vitamin D  and calcium intake  Assessment & Plan: Visit Diagnoses:  1. Age-related osteoporosis without current pathological fracture     Plan: Will go forward with fifth  injection today.  Will draw a calcium and a bone marker at her next injection to see the progression of her bone.  This in her case is medically necessary as she is pending cervical fusion procedure based on bone quality [id number] Exp [id number] Follow-Up Instructions: 31 days  Ortho Exam  Patient is alert, oriented, no adenopathy, well-dressed, normal affect, normal respiratory effort.     Imaging: No results found. No images are attached to the encounter.  Labs: Lab Results  Component Value Date   ESRSEDRATE 4 01/09/2023   ESRSEDRATE 18 04/07/2021   ESRSEDRATE 55 (H) 02/12/2010   CRP 6 01/09/2023   REPTSTATUS 03/02/2017 FINAL 02/25/2017   GRAMSTAIN  02/09/2010    RARE WBC PRESENT,BOTH PMN AND MONONUCLEAR NO ORGANISMS SEEN   CULT  02/25/2017    NO GROWTH 5 DAYS Performed at [facility], [address] [place], [place] [address]      Lab Results  Component Value Date   ALBUMIN  4.0 02/04/2021   ALBUMIN  3.2 (L) 02/27/2017   ALBUMIN  3.9 02/25/2017    Lab Results  Component Value Date    MG 1.7 02/12/2010   MG 1.6 02/11/2010   MG 2.0 02/09/2010   Lab Results  Component Value Date   VD25OH CANCELED 04/24/2023    No results found for: PREALBUMIN    Latest Ref Rng & Units 09/16/2023    8:56 PM 01/09/2023    2:52 PM 08/17/2022    3:28 PM  CBC EXTENDED  WBC 4.0 - 10.5 K/uL 9.1  9.6  9.4   RBC 3.87 - 5.11 MIL/uL 4.48  4.70  4.70   Hemoglobin 12.0 - 15.0 g/dL 86.2  85.9  85.9   HCT 36.0 - 46.0 % 39.9  43.1  42.4   Platelets 150 - 400 K/uL 301  356  374   NEUT# 1.7 - 7.7 K/uL 6.1  5.6  5.1   Lymph# 0.7 - 4.0 K/uL 1.9  2.4  2.4      There is no height or weight on file to calculate BMI.  Orders:  No orders of the defined types were placed in this encounter.  No orders of the defined types were placed in this encounter.    Procedures: No procedures performed  Clinical Data: No additional findings.  ROS:  All other systems negative, except as noted in the HPI. Review of Systems  Objective:  Vital Signs: There were no vitals taken for this visit.  Specialty Comments:  No specialty comments available.  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   Biventricular cardiac pacemaker in situ 10/08/2018   Long term current use of anticoagulant therapy 10/23/2017   AICD (automatic cardioverter/defibrillator) present    Aortic regurgitation    Nonischemic cardiomyopathy (HCC) 01/21/2011   Idiopathic peripheral neuropathy 08/24/2010   Paroxysmal atrial fibrillation (HCC) 02/23/2010   Raynaud's phenomenon 02/15/2007   Past Medical History:  Diagnosis Date   AF (paroxysmal atrial fibrillation)  (HCC)    Allergy    Anxiety    Arthritis    BCC (basal cell carcinoma) 03/11/2003   left distal lower leg ankle-tx dr helga   Biventricular cardiac pacemaker in situ    a. prior CRT-D in 2013 with downgrade to CRT-Pacemaker only in 11/2016.   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 2012: normal coronaries, EF 25-30%. b. improved to 55% 11/2016.   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    Family History  Problem Relation Age of Onset   Heart disease Mother    Heart failure Mother 5       CHF   Stroke Mother    Hypertension Mother    Varicose Veins Mother    Heart disease Father 62   Macular degeneration Father    Hearing loss Father    Vision loss Father    Heart attack Sister    Depression Sister    Heart disease Sister    Varicose Veins Sister    Atrial fibrillation Sister    Heart disease Sister    Arthritis Sister     Past Surgical History:  Procedure Laterality Date   ARTERY BIOPSY  12/29/2011   Procedure:  MINOR BIOPSY TEMPORAL ARTERY;  Surgeon: Morene ONEIDA Olives, MD;  Location: [facility];  Service: General;  Laterality: Right;  Right temporal artery biopsy   BACK SURGERY  12/31/2009   for flat back syndrome; fused bottom of my back   BI-VENTRICULAR IMPLANTABLE CARDIOVERTER DEFIBRILLATOR Left 01/20/2011  Procedure: BI-VENTRICULAR IMPLANTABLE CARDIOVERTER DEFIBRILLATOR  (CRT-D);  Surgeon: Danelle LELON Birmingham, MD;  Location: [facility] CATH LAB;  Service: Cardiovascular;  Laterality: Left;   BIV ICD GENERATOR CHANGEOUT N/A 11/24/2016   Procedure: BIV ICD GENERATOR CHANGEOUT;  Surgeon: Birmingham Danelle LELON, MD;  Location: [facility] INVASIVE CV LAB;  Service: Cardiovascular;  Laterality: N/A;   BREAST LUMPECTOMY WITH RADIOACTIVE SEED LOCALIZATION Left 01/22/2020   Procedure: LEFT BREAST LUMPECTOMY WITH RADIOACTIVE SEED LOCALIZATION;  Surgeon: Belinda Cough, MD;  Location: [facility];  Service: General;  Laterality: Left;  LMA   BREAST SURGERY     CERVICAL ABLATION  03/29/2023   CHOLECYSTECTOMY  ~1996   FOOT NEUROMA SURGERY  ~ 2003   right   RE-EXCISION OF BREAST LUMPECTOMY Left 03/03/2020   Procedure: RE-EXCISION MARGINS OF LEFT BREAST LUMPECTOMY;  Surgeon: Belinda Cough, MD;  Location: [facility];  Service: General;  Laterality: Left;   ROTATOR CUFF REPAIR  ~ 2009   left   SHOULDER ARTHROSCOPY W/ ROTATOR CUFF REPAIR Right 09/21/2011   SKIN CANCER EXCISION     nose, forehead, left leg   SPINE SURGERY     TEE WITHOUT CARDIOVERSION N/A 03/01/2017   Procedure: TRANSESOPHAGEAL ECHOCARDIOGRAM (TEE);  Surgeon: Raford Riggs, MD;  Location: [facility] ENDOSCOPY;  Service: Cardiovascular;  Laterality: N/A;   THYROID  LOBECTOMY  ~ 2006   right   TUBAL LIGATION  ~ 1983   VAGINAL HYSTERECTOMY  ~ 2009   VESICOVAGINAL FISTULA CLOSURE W/ TAH     Social History   Occupational History   Occupation: Runner, Broadcasting/film/video    Comment: n/a  Tobacco Use   Smoking status: Never    Passive exposure:  Never   Smokeless tobacco: Never  Vaping Use   Vaping status: Never Used  Substance and Sexual Activity   Alcohol  use: No   Drug use: No   Sexual activity: Yes

## 2023-11-13 NOTE — Telephone Encounter (Signed)
 Authorization received, info in referral.

## 2023-11-13 NOTE — ED Triage Notes (Signed)
 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 Right lower back and sacral area. Right lower back/hip tightening and firmness of skin. Left elbow pain as well. Struck side of head. Eliquis .

## 2023-11-13 NOTE — ED Notes (Signed)
PA to triage for eval

## 2023-11-13 NOTE — ED Provider Triage Note (Signed)
 Emergency Medicine Provider Triage Evaluation Note  [name] , a 68 y.o. female  was evaluated in triage.  Pt complains of a fall.  Pt hit right flank area on a quartz counter top.  Pt reports she struck her head and her left elbow.  Pt complains of severe pain and large area of swelling   Review of Systems  Positive: Flank pain Negative: No loss of consciousness  Physical Exam  There were no vitals taken for this visit. Gen:   Awake, no distress   Resp:  Normal effort  MSK:   Moves extremities without difficulty  Other:  Abdomen   Medical Decision Making  Medically screening exam initiated at 9:39 PM.  Appropriate orders placed.  [name] was informed that the remainder of the evaluation will be completed by another provider, this initial triage assessment does not replace that evaluation, and the importance of remaining in the ED until their evaluation is complete.     Flint Sonny POUR, PA-C 11/13/23 [id number]

## 2023-11-13 NOTE — Progress Notes (Deleted)
 Subjective:   [name] is a 68 y.o. female who presents for a Medicare Annual Wellness Visit.  Allergies (verified) Other, Iodine, Tape, Codeine, Fentanyl , and Morphine  and codeine   History: Past Medical History:  Diagnosis Date   AF (paroxysmal atrial fibrillation) (HCC)    Allergy    Anxiety    Arthritis    BCC (basal cell carcinoma) 03/11/2003   left distal lower leg ankle-tx dr helga   Biventricular cardiac pacemaker in situ    a. prior CRT-D in 2013 with downgrade to CRT-Pacemaker only in 11/2016.   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 2012: normal coronaries, EF 25-30%. b. improved to 55% 11/2016.   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   Past Surgical History:  Procedure Laterality Date   ARTERY BIOPSY  12/29/2011   Procedure: MINOR BIOPSY TEMPORAL ARTERY;  Surgeon: Morene ONEIDA Olives, MD;  Location: [facility];  Service: General;  Laterality: Right;  Right temporal artery biopsy   BACK  SURGERY  12/31/2009   for flat back syndrome; fused bottom of my back   BI-VENTRICULAR IMPLANTABLE CARDIOVERTER DEFIBRILLATOR Left 01/20/2011   Procedure: BI-VENTRICULAR IMPLANTABLE CARDIOVERTER DEFIBRILLATOR  (CRT-D);  Surgeon: Danelle LELON Birmingham, MD;  Location: [facility] CATH LAB;  Service: Cardiovascular;  Laterality: Left;   BIV ICD GENERATOR CHANGEOUT N/A 11/24/2016   Procedure: BIV ICD GENERATOR CHANGEOUT;  Surgeon: Birmingham Danelle LELON, MD;  Location: [facility] INVASIVE CV LAB;  Service: Cardiovascular;  Laterality: N/A;   BREAST LUMPECTOMY WITH RADIOACTIVE SEED LOCALIZATION Left 01/22/2020   Procedure: LEFT BREAST LUMPECTOMY WITH RADIOACTIVE SEED LOCALIZATION;  Surgeon: Belinda Cough, MD;  Location: [facility];  Service: General;  Laterality: Left;  LMA   BREAST SURGERY     CERVICAL ABLATION  03/29/2023   CHOLECYSTECTOMY  ~1996   FOOT NEUROMA SURGERY  ~ 2003   right   RE-EXCISION OF BREAST LUMPECTOMY Left 03/03/2020   Procedure: RE-EXCISION MARGINS OF LEFT BREAST LUMPECTOMY;  Surgeon: Belinda Cough, MD;  Location: [facility];  Service: General;  Laterality: Left;   ROTATOR CUFF REPAIR  ~ 2009   left   SHOULDER ARTHROSCOPY W/ ROTATOR CUFF REPAIR Right 09/21/2011   SKIN CANCER EXCISION     nose, forehead, left leg   SPINE SURGERY     TEE WITHOUT CARDIOVERSION N/A 03/01/2017   Procedure: TRANSESOPHAGEAL ECHOCARDIOGRAM (TEE);  Surgeon: Raford,  Annabella, MD;  Location: [facility] ENDOSCOPY;  Service: Cardiovascular;  Laterality: N/A;   THYROID  LOBECTOMY  ~ 2006   right   TUBAL LIGATION  ~ 1983   VAGINAL HYSTERECTOMY  ~ 2009   VESICOVAGINAL FISTULA CLOSURE W/ TAH     Family History  Problem Relation Age of Onset   Heart disease Mother    Heart failure Mother 22       CHF   Stroke Mother    Hypertension Mother    Varicose Veins Mother    Heart disease Father 53   Macular degeneration Father    Hearing loss Father    Vision loss Father    Heart attack Sister     Depression Sister    Heart disease Sister    Varicose Veins Sister    Atrial fibrillation Sister    Heart disease Sister    Arthritis Sister    Social History   Occupational History   Occupation: Runner, Broadcasting/film/video    Comment: n/a   Occupation: RETIRED  Tobacco Use   Smoking status: Never    Passive exposure: Never   Smokeless tobacco: Never  Vaping Use   Vaping status: Never Used  Substance and Sexual Activity   Alcohol  use: No   Drug use: No   Sexual activity: Yes   Tobacco Counseling Counseling given: Not Answered  SDOH Screenings   Food Insecurity: No Food Insecurity (11/13/2023)  Housing: Unknown (11/13/2023)  Transportation Needs: No Transportation Needs (11/13/2023)  Utilities: Not At Risk (11/13/2023)  Alcohol  Screen: Low Risk  (09/20/2022)  Depression (PHQ2-9): Low Risk  (11/13/2023)  Financial Resource Strain: Low Risk  (01/25/2023)  Physical Activity: Sufficiently Active (11/13/2023)  Social Connections: Socially Integrated (11/13/2023)  Stress: No Stress Concern Present (11/13/2023)  Tobacco Use: Low Risk  (11/13/2023)  Health Literacy: Adequate Health Literacy (11/13/2023)   Depression Screen    11/13/2023    3:15 PM 07/10/2023    2:50 PM 06/21/2023   10:28 AM 04/10/2023    1:43 PM 01/09/2023    2:18 PM 12/05/2022    1:47 PM 09/20/2022    1:51 PM  PHQ 2/9 Scores  PHQ - 2 Score 0 2 2 6 1 4  0  PHQ- 9 Score 4 8 9 16 9 15       Goals Addressed             This Visit's Progress    Increase physical activity       For stiffness/flexibility/2025       Visit info / Clinical Intake: Medicare Wellness Visit Type:: Subsequent Annual Wellness Visit Medicare Wellness Visit Mode:: Telephone If telephone:: video declined If telephone or video:: unable to obtan vitals due to lack of equipment Interpreter Needed?: No Pre-visit prep was completed: no AWV questionnaire completed by patient prior to visit?: no Living arrangements:: lives with spouse/significant  other Patient's Overall Health Status Rating: (!) fair Typical amount of pain: none Does pain affect daily life?: no Are you currently prescribed opioids?: no  Dietary Habits and Nutritional Risks How many meals a day?: 3 Eats fruit and vegetables daily?: yes Most meals are obtained by: preparing own meals; eating out Diabetic:: no  Functional Status Activities of Daily Living (to include ambulation/medication): Independent Ambulation: Independent with device- listed below Home Assistive Devices/Equipment: Eyeglasses Medication Administration: Independent Home Management: Independent Manage your own finances?: yes Primary transportation is: driving Concerns about vision?: no *vision screening is required for WTM* Concerns about hearing?: (!) yes Uses hearing aids?: no Hear  whispered voice?: -- (has ask people to repeat themselves)  Fall Screening Falls in the past year?: 1 Number of falls in past year: 0 Was there an injury with Fall?: 0 Fall Risk Category Calculator: 1 Patient Fall Risk Level: Low Fall Risk  Fall Risk Patient at Risk for Falls Due to: No Fall Risks Fall risk Follow up: Falls evaluation completed; Falls prevention discussed  Home and Transportation Safety: All rugs have non-skid backing?: yes All stairs or steps have railings?: N/A, no stairs Grab bars in the bathtub or shower?: yes Have non-skid surface in bathtub or shower?: yes (walk in shower) Good home lighting?: yes Regular seat belt use?: yes Hospital stays in the last year:: no  Cognitive Assessment Difficulty concentrating, remembering, or making decisions? : no Will 6CIT or Mini Cog be Completed: no 6CIT or Mini Cog Declined: patient alert, oriented, able to answer questions appropriately and recall recent events  Advance Directives (For Healthcare) Does Patient Have a Medical Advance Directive?: Yes Does patient want to make changes to medical advance directive?: No - Patient  declined Type of Advance Directive: Living will; Healthcare Power of Attorney Copy of Healthcare Power of Attorney in Chart?: No - copy requested Copy of Living Will in Chart?: No - copy requested Would patient like information on creating a medical advance directive?: No - Patient declined  Reviewed/Updated  Reviewed/Updated: All        Objective:    Today's Vitals   11/13/23 1500  Weight: 162 lb (73.5 kg)  Height: 5' 7 (1.702 m)   Body mass index is 25.37 kg/m.  Current Medications (verified) Outpatient Encounter Medications as of 11/13/2023  Medication Sig   albuterol  (VENTOLIN  HFA) 108 (90 Base) MCG/ACT inhaler Inhale 2 puffs into the lungs every 6 (six) hours as needed for wheezing or shortness of breath.   anastrozole  (ARIMIDEX ) 1 MG tablet Take 1 tablet (1 mg total) by mouth daily.   apixaban  (ELIQUIS ) 5 MG TABS tablet TAKE 1 TABLET TWICE DAILY   eletriptan  (RELPAX ) 40 MG tablet Take 1 tablet (40 mg total) by mouth every 2 (two) hours as needed for migraine or headache. May repeat in 2 hours if headache persists or recurs.   gabapentin  (NEURONTIN ) 600 MG tablet Take 600 mg by mouth 2 (two) times daily.   levothyroxine  (SYNTHROID ) 100 MCG tablet Take 1 tablet (100 mcg total) by mouth daily before breakfast.   omeprazole  (PRILOSEC) 40 MG capsule Take 1 capsule (40 mg total) by mouth daily.   propranolol  (INDERAL ) 60 MG tablet Take 1 tablet (60 mg total) by mouth daily.   spironolactone  (ALDACTONE ) 25 MG tablet Take 0.5 tablets (12.5 mg total) by mouth daily.   traZODone (DESYREL) 50 MG tablet Take 0.5 tablets (25 mg total) by mouth at bedtime as needed for sleep.   Facility-Administered Encounter Medications as of 11/13/2023  Medication   [COMPLETED] Romosozumab -aqqg (EVENITY ) 105 MG/1. injection 210 mg   [START ON 12/10/2023] Romosozumab -aqqg (EVENITY ) 105 MG/1. injection 210 mg   Hearing/Vision screen Hearing Screening - Comments:: Noticed some hearing loss-per  pt/asking for people to repeat Vision Screening - Comments:: Wears eyeglasses/Dr. Trine pt -up todate Immunizations and Health Maintenance Health Maintenance  Topic Date Due   Hepatitis C Screening  Never done   Zoster Vaccines- Shingrix (1 of 2) 10/19/1972   Medicare Annual Wellness (AWV)  09/20/2023   Mammogram  12/26/2023   COVID-19 Vaccine (7 - 2025-26 season) 04/26/2024 (Originally 09/11/2023)   DEXA SCAN  03/09/2024  Colonoscopy  04/06/2024   DTaP/Tdap/Td (4 - Td or Tdap) 01/13/2033   Pneumococcal Vaccine: 50+ Years  Completed   Influenza Vaccine  Completed   Meningococcal B Vaccine  Aged Out        Assessment/Plan:  This is a routine wellness examination for [name].  Patient Care Team: Jerrell Cleatus Ned, MD as PCP - General (Internal Medicine) Waddell Danelle ORN, MD as PCP - Electrophysiology (Cardiology) Lonni Slain, MD as PCP - Cardiology (Cardiology) Cleotilde Ronal RAMAN, MD as Consulting Physician (Gynecology) Cloretta Arley NOVAK, MD as Consulting Physician (Oncology) Elicia Claw, MD as Consulting Physician (Gastroenterology) Mammography, [facility] (Diagnostic Radiology) Waylan Cain, MD as Consulting Physician (Ophthalmology)  I have personally reviewed and noted the following in the patient's chart:   Medical and social history Use of alcohol , tobacco or illicit drugs  Current medications and supplements including opioid prescriptions. Functional ability and status Nutritional status Physical activity Advanced directives List of other physicians Hospitalizations, surgeries, and ER visits in previous 12 months Vitals Screenings to include cognitive, depression, and falls Referrals and appointments  No orders of the defined types were placed in this encounter.  In addition, I have reviewed and discussed with patient certain preventive protocols, quality metrics, and best practice recommendations. A written personalized care plan for preventive  services as well as general preventive health recommendations were provided to patient.   [name], CMA   11/13/2023   No follow-ups on file.  After Visit Summary: (MyChart) Due to this being a telephonic visit, the after visit summary with patients personalized plan was offered to patient via MyChart   Nurse Notes: Patient is due for a mammogram coming in December.  She is also due for a shingrix vaccine.  Patient had no other concerns to address today.

## 2023-11-13 NOTE — ED Provider Notes (Signed)
  Provider Note MRN:  [id number]  Arrival date & time: 11/14/23    ED Course and Medical Decision Making  Assumed care of patient at sign-out or upon transfer.  Ground-level fall with anticoagulation, awaiting scans.  12:15 AM update: Imaging without significant internal injuries.  There is a large hematoma to the right flank region.  On exam this area is dense and quite large, she is still having quite a bit of pain.  Per family this hematoma was only the size of a fist a few hours ago.  She is also exhibiting low blood pressures more recently with nausea.  Case discussed with Dr. Rubin of trauma surgery, providing some fluids, abdominal binder, will admit to hospitalist service.  .Critical Care  Performed by: Theadore Ozell HERO, MD Authorized by: Theadore Ozell HERO, MD   Critical care provider statement:    Critical care time (minutes):  35   Critical care was necessary to treat or prevent imminent or life-threatening deterioration of the following conditions:  Trauma   Critical care was time spent personally by me on the following activities:  Development of treatment plan with patient or surrogate, discussions with consultants, evaluation of patient's response to treatment, examination of patient, ordering and review of laboratory studies, ordering and review of radiographic studies, ordering and performing treatments and interventions, pulse oximetry, re-evaluation of patient's condition and review of old charts   Final Clinical Impressions(s) / ED Diagnoses     ICD-10-CM   1. Hematoma of right flank, initial encounter  S30.13XA     2. Cervicalgia  M54.2 CT CERVICAL SPINE WO CONTRAST    CT CERVICAL SPINE WO CONTRAST      ED Discharge Orders     None       Discharge Instructions   None     Ozell HERO. Theadore, MD [facility] Emergency Medicine [facility] [email or web address]    Theadore Ozell HERO, MD 11/14/23 [phone]

## 2023-11-14 ENCOUNTER — Ambulatory Visit: Payer: Self-pay

## 2023-11-14 ENCOUNTER — Other Ambulatory Visit (HOSPITAL_BASED_OUTPATIENT_CLINIC_OR_DEPARTMENT_OTHER): Payer: Self-pay | Admitting: Family Medicine

## 2023-11-14 ENCOUNTER — Other Ambulatory Visit: Payer: Self-pay

## 2023-11-14 ENCOUNTER — Emergency Department (HOSPITAL_COMMUNITY)

## 2023-11-14 ENCOUNTER — Emergency Department (HOSPITAL_BASED_OUTPATIENT_CLINIC_OR_DEPARTMENT_OTHER)

## 2023-11-14 ENCOUNTER — Encounter (HOSPITAL_BASED_OUTPATIENT_CLINIC_OR_DEPARTMENT_OTHER): Payer: Self-pay | Admitting: Internal Medicine

## 2023-11-14 DIAGNOSIS — T148XXA Other injury of unspecified body region, initial encounter: Secondary | ICD-10-CM | POA: Diagnosis not present

## 2023-11-14 DIAGNOSIS — D62 Acute posthemorrhagic anemia: Secondary | ICD-10-CM | POA: Diagnosis not present

## 2023-11-14 DIAGNOSIS — Z743 Need for continuous supervision: Secondary | ICD-10-CM | POA: Diagnosis not present

## 2023-11-14 DIAGNOSIS — S3013XA Contusion of flank (latus) region, initial encounter: Secondary | ICD-10-CM | POA: Diagnosis present

## 2023-11-14 DIAGNOSIS — S5002XA Contusion of left elbow, initial encounter: Secondary | ICD-10-CM | POA: Diagnosis present

## 2023-11-14 DIAGNOSIS — S0990XA Unspecified injury of head, initial encounter: Secondary | ICD-10-CM | POA: Diagnosis not present

## 2023-11-14 DIAGNOSIS — Z7901 Long term (current) use of anticoagulants: Secondary | ICD-10-CM | POA: Diagnosis not present

## 2023-11-14 DIAGNOSIS — S300XXA Contusion of lower back and pelvis, initial encounter: Secondary | ICD-10-CM | POA: Diagnosis not present

## 2023-11-14 DIAGNOSIS — M542 Cervicalgia: Secondary | ICD-10-CM | POA: Diagnosis present

## 2023-11-14 DIAGNOSIS — S59902A Unspecified injury of left elbow, initial encounter: Secondary | ICD-10-CM | POA: Diagnosis not present

## 2023-11-14 DIAGNOSIS — G4489 Other headache syndrome: Secondary | ICD-10-CM | POA: Diagnosis not present

## 2023-11-14 LAB — HEMOGLOBIN AND HEMATOCRIT, BLOOD
HCT: 26.9 % — ABNORMAL LOW (ref 36.0–46.0)
HCT: 31.8 % — ABNORMAL LOW (ref 36.0–46.0)
Hemoglobin: 8.6 g/dL — ABNORMAL LOW (ref 12.0–15.0)
Hemoglobin: 10.3 g/dL — ABNORMAL LOW (ref 12.0–15.0)

## 2023-11-14 LAB — CBC
HCT: 30 % — ABNORMAL LOW (ref 36.0–46.0)
Hemoglobin: 10 g/dL — ABNORMAL LOW (ref 12.0–15.0)
MCH: 30.1 pg (ref 26.0–34.0)
MCHC: 33.3 g/dL (ref 30.0–36.0)
MCV: 90.4 fL (ref 80.0–100.0)
Platelets: 239 K/uL (ref 150–400)
RBC: 3.32 MIL/uL — ABNORMAL LOW (ref 3.87–5.11)
RDW: 12.7 % (ref 11.5–15.5)
WBC: 8.8 K/uL (ref 4.0–10.5)
nRBC: 0 % (ref 0.0–0.2)

## 2023-11-14 LAB — TYPE AND SCREEN
ABO/RH(D): O NEG
Antibody Screen: NEGATIVE

## 2023-11-14 LAB — PROTIME-INR
INR: 1.1 (ref 0.8–1.2)
Prothrombin Time: 14.9 s (ref 11.4–15.2)

## 2023-11-14 MED ORDER — ELETRIPTAN HYDROBROMIDE 40 MG PO TABS
40.0000 mg | ORAL_TABLET | Freq: Once | ORAL | Status: AC
  Administered 2023-11-14: 40 mg via ORAL
  Filled 2023-11-14: qty 1

## 2023-11-14 MED ORDER — TRAMADOL HCL 50 MG PO TABS: 50.0000 mg | ORAL_TABLET | Freq: Four times a day (QID) | ORAL | Status: DC | PRN

## 2023-11-14 MED ORDER — ACETAMINOPHEN 650 MG RE SUPP: 650.0000 mg | Freq: Four times a day (QID) | RECTAL | Status: DC | PRN

## 2023-11-14 MED ORDER — ANASTROZOLE 1 MG PO TABS
1.0000 mg | ORAL_TABLET | Freq: Every day | ORAL | Status: DC
  Administered 2023-11-14 – 2023-11-16 (×3): 1 mg via ORAL
  Filled 2023-11-14 (×3): qty 1

## 2023-11-14 MED ORDER — SODIUM CHLORIDE 0.9 % IV BOLUS (SEPSIS)
500.0000 mL | Freq: Once | INTRAVENOUS | Status: AC
  Administered 2023-11-14: 500 mL via INTRAVENOUS

## 2023-11-14 MED ORDER — PANTOPRAZOLE SODIUM 40 MG PO TBEC
40.0000 mg | DELAYED_RELEASE_TABLET | Freq: Every day | ORAL | Status: DC
  Administered 2023-11-14 – 2023-11-16 (×3): 40 mg via ORAL
  Filled 2023-11-14 (×3): qty 1

## 2023-11-14 MED ORDER — TRAZODONE HCL 50 MG PO TABS
25.0000 mg | ORAL_TABLET | Freq: Every evening | ORAL | Status: DC | PRN
  Administered 2023-11-14 – 2023-11-15 (×2): 25 mg via ORAL
  Filled 2023-11-14 (×2): qty 1

## 2023-11-14 MED ORDER — SODIUM CHLORIDE 0.9 % IV BOLUS
1000.0000 mL | Freq: Once | INTRAVENOUS | Status: AC
  Administered 2023-11-14: 1000 mL via INTRAVENOUS

## 2023-11-14 MED ORDER — METOCLOPRAMIDE HCL 5 MG/ML IJ SOLN
10.0000 mg | Freq: Once | INTRAMUSCULAR | Status: AC
  Administered 2023-11-14: 10 mg via INTRAVENOUS
  Filled 2023-11-14: qty 2

## 2023-11-14 MED ORDER — METHOCARBAMOL 500 MG PO TABS
500.0000 mg | ORAL_TABLET | Freq: Four times a day (QID) | ORAL | Status: DC | PRN
Start: 2023-11-14 — End: 2023-11-16
  Administered 2023-11-15 – 2023-11-16 (×2): 500 mg via ORAL
  Filled 2023-11-14 (×2): qty 1

## 2023-11-14 MED ORDER — ACETAMINOPHEN 500 MG PO TABS
1000.0000 mg | ORAL_TABLET | Freq: Three times a day (TID) | ORAL | Status: DC
  Administered 2023-11-14 – 2023-11-16 (×6): 1000 mg via ORAL
  Filled 2023-11-14 (×6): qty 2

## 2023-11-14 MED ORDER — ONDANSETRON HCL 4 MG/2ML IJ SOLN
4.0000 mg | Freq: Once | INTRAMUSCULAR | Status: AC
  Administered 2023-11-14: 4 mg via INTRAVENOUS
  Filled 2023-11-14: qty 2

## 2023-11-14 MED ORDER — LACTATED RINGERS IV SOLN: INTRAVENOUS | Status: DC

## 2023-11-14 MED ORDER — LEVOTHYROXINE SODIUM 100 MCG PO TABS
100.0000 ug | ORAL_TABLET | Freq: Every day | ORAL | Status: DC
  Administered 2023-11-15 – 2023-11-16 (×2): 100 ug via ORAL
  Filled 2023-11-14 (×2): qty 1

## 2023-11-14 MED ORDER — ACETAMINOPHEN 325 MG PO TABS
650.0000 mg | ORAL_TABLET | Freq: Four times a day (QID) | ORAL | Status: DC | PRN
  Filled 2023-11-14 (×2): qty 2

## 2023-11-14 MED ORDER — ONDANSETRON HCL 4 MG/2ML IJ SOLN: 4.0000 mg | Freq: Four times a day (QID) | INTRAMUSCULAR | Status: DC | PRN

## 2023-11-14 MED ORDER — GABAPENTIN 300 MG PO CAPS
600.0000 mg | ORAL_CAPSULE | Freq: Two times a day (BID) | ORAL | Status: DC
  Administered 2023-11-14 – 2023-11-16 (×5): 600 mg via ORAL
  Filled 2023-11-14 (×5): qty 2

## 2023-11-14 MED ORDER — ONDANSETRON HCL 4 MG PO TABS
4.0000 mg | ORAL_TABLET | Freq: Four times a day (QID) | ORAL | Status: DC | PRN
Start: 2023-11-14 — End: 2023-11-16

## 2023-11-14 MED ORDER — HYDROCODONE-ACETAMINOPHEN 5-325 MG PO TABS
0.5000 | ORAL_TABLET | Freq: Once | ORAL | Status: DC
  Filled 2023-11-14: qty 1

## 2023-11-14 NOTE — Consult Note (Addendum)
 [name] April 25, 1955  [id number].    Requesting MD: Theadore, MD Chief Complaint/Reason for Consult: fall, right flank/low back hematoma   HPI:  [name] is a 68 y/o F with a PMH including, but not limted to, afib on Eliquis , CHF, HTN, and cardiac pacemaker placement who presents after a ground level fall. States she was cleaning up the floor yesterday afternoon when she lost her balance and ultimately fell backwards, hitting her right hip/back on a kitchen michaelfurt before falling to the floor. She does report hitting her head on the floor but denies LOC. She was helped up by her husband but presented to med center due to pain. She reports back, head (migraine), and left elbow pain. Her last dose of Eliquis  was 11/3 AM. She denies tobacco, alcohol , or drug use. Denies nausea or vomiting.   Trauma workup was significant for a hematoma of the right flank. Abdominal binder was ordered and the trauma and medical services have been asked to admit the patient for observation/further management.    ROS: Review of Systems  All other systems reviewed and are negative.   Family History  Problem Relation Age of Onset   Heart disease Mother    Heart failure Mother 3       CHF   Stroke Mother    Hypertension Mother    Varicose Veins Mother    Heart disease Father 29   Macular degeneration Father    Hearing loss Father    Vision loss Father    Heart attack Sister    Depression Sister    Heart disease Sister    Varicose Veins Sister    Atrial fibrillation Sister    Heart disease Sister    Arthritis Sister     Past Medical History:  Diagnosis Date   AF (paroxysmal atrial fibrillation) (HCC)    Allergy    Anxiety    Arthritis    BCC (basal cell carcinoma) 03/11/2003   left distal lower leg ankle-tx dr helga   Biventricular cardiac pacemaker in situ    a. prior CRT-D in 2013 with downgrade to CRT-Pacemaker only in 11/2016.   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 2012: normal coronaries, EF 25-30%. b. improved to 55% 11/2016.   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    Past Surgical History:  Procedure Laterality Date   ARTERY BIOPSY  12/29/2011   Procedure: MINOR BIOPSY TEMPORAL ARTERY;  Surgeon: Morene ONEIDA Olives, MD;  Location: [facility];  Service: General;  Laterality: Right;  Right temporal artery biopsy   BACK SURGERY  12/31/2009   for flat back syndrome; fused bottom of my back   BI-VENTRICULAR IMPLANTABLE CARDIOVERTER DEFIBRILLATOR Left 01/20/2011   Procedure: BI-VENTRICULAR IMPLANTABLE CARDIOVERTER DEFIBRILLATOR  (CRT-D);  Surgeon: Danelle LELON Birmingham, MD;  Location: [facility] CATH LAB;  Service: Cardiovascular;  Laterality: Left;   BIV ICD GENERATOR CHANGEOUT N/A 11/24/2016   Procedure: BIV ICD GENERATOR CHANGEOUT;  Surgeon: Birmingham Danelle LELON, MD;  Location: [facility] INVASIVE CV LAB;  Service: Cardiovascular;  Laterality: N/A;   BREAST LUMPECTOMY WITH RADIOACTIVE SEED LOCALIZATION Left  01/22/2020   Procedure: LEFT BREAST LUMPECTOMY WITH RADIOACTIVE SEED LOCALIZATION;  Surgeon: Belinda Cough, MD;  Location: [facility];  Service: General;  Laterality: Left;  LMA   BREAST SURGERY     CERVICAL ABLATION  03/29/2023   CHOLECYSTECTOMY  ~1996   FOOT NEUROMA SURGERY  ~ 2003   right   RE-EXCISION OF BREAST LUMPECTOMY Left 03/03/2020   Procedure: RE-EXCISION MARGINS OF LEFT BREAST LUMPECTOMY;  Surgeon: Belinda Cough, MD;  Location: [facility];  Service: General;  Laterality: Left;   ROTATOR CUFF REPAIR  ~ 2009   left   SHOULDER ARTHROSCOPY W/ ROTATOR CUFF REPAIR Right 09/21/2011   SKIN CANCER EXCISION     nose, forehead, left leg   SPINE SURGERY     TEE WITHOUT CARDIOVERSION N/A 03/01/2017   Procedure: TRANSESOPHAGEAL ECHOCARDIOGRAM (TEE);  Surgeon: Raford Riggs, MD;  Location: [facility] ENDOSCOPY;  Service: Cardiovascular;  Laterality: N/A;   THYROID  LOBECTOMY  ~ 2006   right   TUBAL LIGATION  ~ 1983   VAGINAL HYSTERECTOMY  ~ 2009   VESICOVAGINAL FISTULA CLOSURE W/ TAH      Social History:  reports that she has never smoked. She has never been exposed to tobacco smoke. She has never used smokeless tobacco. She reports that she does not drink alcohol  and does not use drugs.  Allergies:  Allergies  Allergen Reactions   Other Nausea And Vomiting and Other (See Comments)    Most narcotic pain meds cause N/V and CHEST PAIN; needs an antiemetic to tolerate  Also, patient was diagnosed with Raynaud's disease and was told to NOT place IV(s) in either hand because of poor circulation   Iodine Other (See Comments)    Blisters   Tape Other (See Comments)    PATIENT'S SKIN IS VERY THIN; IT TEARS, BRUISES, AND BLEEDS VERY EASILY (PLEASE USE COBAN WRAP, IF POSSIBLE!!)   Codeine Other (See Comments)    Chest pain   Fentanyl  Other (See Comments)    Bad dreams   Morphine  And Codeine Nausea And Vomiting    (Not in a hospital  admission)    Physical Exam: Blood pressure 104/73, pulse 75, temperature 98 F (36.7 C), temperature source Oral, resp. rate 17, SpO2 95%. General: elderly female laying on hospital bed, appears stated age, NAD. HEENT: head -normocephalic, atraumatic; Eyes: PERRLA, no conjunctival injection Neck- Trachea is midline CV- RRR, normal S1/S2, no M/R/G, radial and dorsalis pedis pulses 2+ BL,  no lower extremity edema  Pulm- breathing is non-labored ORA. CTABL, no wheezes, rhales, rhonchi. Abd- soft, non-distended, fullness with mild ecchymosis of the right low back/flank. Abdominal binder is on but it is oversized and not tight.  GU- deferred  MSK- UE/LE symmetrical, no cyanosis, clubbing, or edema.  Grip strength 5/5 bilaterally, there is tenderness over the left elbow with mild swelling Neuro- CN II-XII grossly in tact, no paresthesias. Psych- Alert and Oriented x3 with appropriate affect Skin: warm and dry, no rashes or lesions   Results for orders placed or performed during the hospital encounter of 11/13/23 (from the past 48 hours)  CBC with Differential     Status: Abnormal   Collection Time: 11/13/23  9:39 PM  Result Value Ref Range   WBC 14.1 (H) 4.0 - 10.5 K/uL   RBC 4.46 3.87 - 5.11 MIL/uL   Hemoglobin 13.5 12.0 - 15.0 g/dL   HCT 59.8 63.9 - 53.9 %   MCV 89.9 80.0 - 100.0 fL   MCH 30.3 26.0 - 34.0 pg   MCHC 33.7 30.0 - 36.0 g/dL   RDW 87.5 88.4 - 84.4 %   Platelets 306 150 - 400 K/uL   nRBC 0.0 0.0 - 0.2 %   Neutrophils Relative % 71 %   Neutro Abs 10.0 (H) 1.7 - 7.7 K/uL   Lymphocytes Relative 18 %   Lymphs Abs 2.5 0.7 - 4.0 K/uL   Monocytes Relative 9 %   Monocytes Absolute 1.2 (H) 0.1 - 1.0 K/uL   Eosinophils Relative 1 %   Eosinophils Absolute 0.2 0.0 - 0.5 K/uL   Basophils Relative 1 %   Basophils Absolute 0.1 0.0 - 0.1 K/uL   Immature Granulocytes 0 %   Abs Immature Granulocytes 0.03 0.00 - 0.07 K/uL    Comment: Performed at [facility],  [address], [place], [place] [address]  Comprehensive metabolic panel     Status: Abnormal   Collection Time: 11/13/23  9:39 PM  Result Value Ref Range   Sodium 141 135 - 145 mmol/L   Potassium 3.9 3.5 - 5.1 mmol/L   Chloride 106 98 - 111 mmol/L   CO2 27 22 - 32 mmol/L   Glucose, Bld 107 (H) 70 - 99 mg/dL    Comment: Glucose reference range applies only to samples taken after fasting for at least 8 hours.   BUN 23 8 - 23 mg/dL   Creatinine, Ser 8.87 (H) 0.44 - 1.00 mg/dL   Calcium 9.4 8.9 - 89.6 mg/dL   Total Protein 6.4 (L) 6.5 - 8.1 g/dL   Albumin  4.1 3.5 - 5.0 g/dL   AST 27 15 - 41 U/L   ALT 20 0 - 44 U/L   Alkaline Phosphatase 89 38 - 126 U/L   Total Bilirubin 0.3 0.0 - 1.2 mg/dL   GFR, Estimated 53 (L) >60 mL/min    Comment: (NOTE) Calculated using the CKD-EPI Creatinine Equation (2021)    Anion gap 8 5 - 15    Comment: Performed at [facility], [address] [place], [place] [address]  CBC     Status: Abnormal   Collection Time: 11/14/23  7:37 AM  Result Value Ref Range   WBC 8.8 4.0 - 10.5 K/uL   RBC 3.32 (L) 3.87 - 5.11 MIL/uL   Hemoglobin 10.0 (L) 12.0 - 15.0 g/dL    Comment: REPEATED TO VERIFY   HCT 30.0 (L) 36.0 - 46.0 %   MCV 90.4 80.0 - 100.0 fL   MCH 30.1 26.0 - 34.0 pg   MCHC 33.3 30.0 - 36.0 g/dL   RDW 87.2 88.4 - 84.4 %   Platelets 239 150 - 400 K/uL   nRBC 0.0 0.0 - 0.2 %    Comment: Performed at [facility],  [address] [place], [place] [address]   CT Head Wo Contrast Result Date: 11/14/2023 EXAM: CT HEAD WITHOUT CONTRAST 11/14/2023 10:07:00 AM TECHNIQUE: CT of the head was performed without the administration of intravenous contrast. Automated exposure control, iterative reconstruction, and/or weight based adjustment of the mA/kV was utilized to reduce the radiation dose to as low as reasonably achievable. COMPARISON: Head CT yesterday. CT 06/24/2013. CLINICAL HISTORY: 68 year old female. Head trauma,  moderate-severe. FINDINGS: BRAIN AND VENTRICLES: No acute hemorrhage. No evidence of acute infarct. No hydrocephalus. No extra-axial collection. No mass effect or midline shift. Brain volume is stable and normal for age. Gray white differentiation stable and normal for age. No suspicious intracranial vascular hyperdensity. ORBITS: No acute abnormality. SINUSES: Chronic bilateral sphenoid sinus disease, chronic right posterior ethmoid sinus disease. Chronic mucoperiosteal thickening on the right. Evolution of left sphenoid sinus changes since 2015 also most compatible with chronic mucoperiosteal thickening. Bubbly opacity also on that side. No complicating features identified. Other paranasal sinuses, middle ears and mastoids remain well aerated. SOFT TISSUES AND SKULL: No acute soft tissue abnormality. No skull fracture. IMPRESSION: 1. Normal for age non-contrast CT appearance of the brain. 2. Advanced sphenoid sinus disease with acute on chronic features, no complication. Electronically signed by: Helayne Hurst MD 11/14/2023 10:14 AM EST RP Workstation: [id number]   CT T-SPINE NO CHARGE Result Date: 11/13/2023 EXAM: CT THORACIC SPINE WITHOUT CONTRAST 11/13/2023 10:37:38 PM TECHNIQUE: CT of the thoracic spine was performed without the administration of intravenous contrast. Multiplanar reformatted images are provided for review. Automated exposure control, iterative reconstruction, and/or weight based adjustment of the mA/kV was utilized to reduce the radiation dose to as low as reasonably achievable. COMPARISON: None available. CLINICAL HISTORY: FINDINGS: BONES AND ALIGNMENT: Thoracolumbar to sacrum and iliac surgical hardware. Right thoracolumbar scoliosis. No CT finding to suggest surgical hardware acute complication. Diffusely decreased bone density. Chronic L2 compression fracture stable. No acute fracture or suspicious bone lesion. DEGENERATIVE CHANGES: No significant degenerative changes. SOFT TISSUES: No  acute abnormality. IMPRESSION: 1. Postoperative thoracolumbar to sacrum and iliac hardware without CT evidence of acute complication. No acute abnormality of the thoracolumbar spine. 2. Diffusely decreased bone density. Electronically signed by: Morgane Naveau MD 11/13/2023 11:15 PM EST RP Workstation: [id number]   CT L-SPINE NO CHARGE Result Date: 11/13/2023 EXAM: CT OF THE LUMBAR SPINE WITHOUT CONTRAST 11/13/2023 10:37:38 PM TECHNIQUE: CT of the lumbar spine was performed without the administration of intravenous contrast. Multiplanar reformatted images are provided for review. Automated exposure control, iterative reconstruction, and/or weight based adjustment of the mA/kV was utilized to reduce the radiation dose to as low as reasonably achievable. COMPARISON:N None available. CLINICAL HISTORY: FINDINGS: BONES AND ALIGNMENT: Thoracolumbar to sacrum and iliac surgical hardware. Right thoracolumbar scoliosis. Diffusely decreased bone density. Chronic L2 compression fracture stable. No CT finding to suggest surgical hardware acute complication. Normal vertebral body heights (except L2). No acute fracture or suspicious bone lesion. DEGENERATIVE CHANGES: No significant degenerative changes. SOFT TISSUES: No acute abnormality. IMPRESSION: 1. Thoracolumbar to sacrum and iliac surgical hardware without CT evidence of acute complication. Electronically signed by: Morgane Naveau MD 11/13/2023 11:14 PM EST RP Workstation: [id number]   CT CHEST ABDOMEN PELVIS W CONTRAST Result Date: 11/13/2023 EXAM: CT CHEST, ABDOMEN AND PELVIS WITH CONTRAST 11/13/2023 10:37:38 PM TECHNIQUE: CT of the chest, abdomen and pelvis was performed with the administration of iohexol  (OMNIPAQUE ) 300 MG/ML solution. Multiplanar reformatted images are provided for review. Automated exposure control, iterative reconstruction, and/or weight based adjustment of  the mA/kV was utilized to reduce the radiation dose to as low as reasonably  achievable. COMPARISON: None available. CLINICAL HISTORY: Polytrauma, blunt. FINDINGS: CHEST: MEDIASTINUM AND LYMPH NODES: Heart and pericardium are unremarkable. The central airways are clear. No mediastinal, hilar or axillary lymphadenopathy. No mediastinal hematoma. No pneumomediastinum. LUNGS AND PLEURA: No focal consolidation or pulmonary edema. No pleural effusion or pneumothorax. ABDOMEN AND PELVIS: LIVER: The liver is unremarkable. GALLBLADDER AND BILE DUCTS: Status post cholecystectomy. No biliary ductal dilatation. SPLEEN: No acute abnormality. PANCREAS: No acute abnormality. ADRENAL GLANDS: No acute abnormality. KIDNEYS, URETERS AND BLADDER: No stones in the kidneys or ureters. No hydronephrosis. No perinephric or periureteral stranding. Urinary bladder is unremarkable. GI AND BOWEL: Stomach demonstrates no acute abnormality. There is no bowel obstruction. The appendix is unremarkable. REPRODUCTIVE ORGANS: Status post hysterectomy. No adnexal mass. PERITONEUM AND RETROPERITONEUM: No ascites. No free air. No mesenteric hematoma. VASCULATURE: Aorta is normal in caliber. ABDOMINAL AND PELVIS LYMPH NODES: No lymphadenopathy. BONES AND SOFT TISSUES: Left chest wall dual cardiac pacemaker and defibrillator. Please see separately dictated CT thoracolumbar spine. No acute bilateral hip or pelvic abnormalities fracture. No acute displaced rib fracture. No acute displaced sternal fracture. No sacral fracture. 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 Right medial soft tissue acute hematoma measuring up to 13 x 6 cm. IMPRESSION: 1. Right medial soft tissue acute hematoma measuring up to 13 x 6 cm. No mesenteric or mediastinal hematoma. 2. No acute intrathoracic, intraabdominal, or intrapelvic abnormality. Electronically signed by: Morgane Naveau MD 11/13/2023 11:12 PM EST RP Workstation: [id number]   CT Head Wo Contrast Result Date: 11/13/2023 EXAM: CT HEAD AND CERVICAL SPINE  11/13/2023 10:37:38 PM TECHNIQUE: CT of the head and cervical spine was performed without the administration of intravenous contrast. Multiplanar reformatted images are provided for review. Automated exposure control, iterative reconstruction, and/or weight based adjustment of the mA/kV was utilized to reduce the radiation dose to as low as reasonably achievable. COMPARISON: CT cervical spine 09/23/2022. CT head 06/24/2013 CLINICAL HISTORY: Head trauma, moderate-severe FINDINGS: CT HEAD BRAIN AND VENTRICLES: No acute intracranial hemorrhage. No mass effect or midline shift. No abnormal extra-axial fluid collection. No evidence of acute infarct. No hydrocephalus. ORBITS: No acute abnormality. SINUSES AND MASTOIDS: Chronically opacified and expanded right posterior ethmoid air cells and sphenoid sinus. No mastoid effusions. SOFT TISSUES AND SKULL: No acute skull fracture. No acute soft tissue abnormality. CT CERVICAL SPINE BONES AND ALIGNMENT: No acute fracture or traumatic malalignment. Unchanged dextrocurvature. DEGENERATIVE CHANGES: Multilevel degenerative change including likely moderate foraminal stenosis bilaterally at C5-C6 and C6-C7. SOFT TISSUES: No prevertebral soft tissue swelling. IMPRESSION: 1. No acute intracranial abnormality. 2. No acute fracture or traumatic malalignment of the cervical spine. 3. Chronically opacified and expanded right posterior ethmoid air cells and sphenoid sinus, suggestive of mucocele. Electronically signed by: Gilmore Molt MD 11/13/2023 11:04 PM EST RP Workstation: [id number]   CT Cervical Spine Wo Contrast Result Date: 11/13/2023 EXAM: CT HEAD AND CERVICAL SPINE 11/13/2023 10:37:38 PM TECHNIQUE: CT of the head and cervical spine was performed without the administration of intravenous contrast. Multiplanar reformatted images are provided for review. Automated exposure control, iterative reconstruction, and/or weight based adjustment of the mA/kV was utilized to reduce the  radiation dose to as low as reasonably achievable. COMPARISON: CT cervical spine 09/23/2022. CT head 06/24/2013 CLINICAL HISTORY: Head trauma, moderate-severe FINDINGS: CT HEAD BRAIN AND VENTRICLES: No acute intracranial hemorrhage. No mass effect or midline shift. No abnormal extra-axial fluid  collection. No evidence of acute infarct. No hydrocephalus. ORBITS: No acute abnormality. SINUSES AND MASTOIDS: Chronically opacified and expanded right posterior ethmoid air cells and sphenoid sinus. No mastoid effusions. SOFT TISSUES AND SKULL: No acute skull fracture. No acute soft tissue abnormality. CT CERVICAL SPINE BONES AND ALIGNMENT: No acute fracture or traumatic malalignment. Unchanged dextrocurvature. DEGENERATIVE CHANGES: Multilevel degenerative change including likely moderate foraminal stenosis bilaterally at C5-C6 and C6-C7. SOFT TISSUES: No prevertebral soft tissue swelling. IMPRESSION: 1. No acute intracranial abnormality. 2. No acute fracture or traumatic malalignment of the cervical spine. 3. Chronically opacified and expanded right posterior ethmoid air cells and sphenoid sinus, suggestive of mucocele. Electronically signed by: Gilmore Molt MD 11/13/2023 11:04 PM EST RP Workstation: [id number]   DG Elbow Complete Left Result Date: 11/13/2023 EXAM: 3 VIEW(S) XRAY OF THE LEFT ELBOW COMPARISON: None available. CLINICAL HISTORY: fall, pain FINDINGS: BONES AND JOINTS: Left elbow joint effusion noted. No visible fracture but question of an occult fracture given the effusion. No joint dislocation. SOFT TISSUES: The soft tissues are unremarkable. IMPRESSION: 1. Left elbow joint effusion concerning for an occult fracture. 2. No visible fracture identified. Consider immobilization and repeat imaging in 1 week if symptoms persist. Electronically signed by: Franky Crease MD 11/13/2023 10:34 PM EST RP Workstation: [id number]    Assessment/Plan 68 y/o F s/p ground level fall Right lower back/flank hematoma -  hold Eliquis , continue abdominal binder, trend hgb, transfuse PRN; asked RN to obtain new abd binder that is appropriate size ABL anemia - due to above; hgb 10.0 from 13.5 yesterday, repeat ordered for 1500. Discussed with Dr. Paola -  If hgb stable then we can continue to monitor at [facility] long, if hgb continues to drop then the patient will require transfer to designated trauma center [facility]) Left elbow joint effusion - sling, ortho c/s   FEN - ok for a solid diet from a trauma persepctive VTE - SCD's, hold chemical VTE ppx ID - none indicated Admit - to TRH service for MMP   Afib on Eliquis   Hx splenic infarction HTN CHF LBBB History of biventricular pacemaker Essential tremor Generalized anxiety disorder Post-surgical hypothyroidism  Migraine HA  Osteoporosis Back pain  I reviewed nursing notes, ED provider notes, hospitalist notes, last 24 h vitals and pain scores, last 48 h intake and output, last 24 h labs and trends, and last 24 h imaging results.  Almarie Pringle, [facility] Surgery 11/14/2023, 10:43 AM Please see Amion for pager number during day hours 7:00am-4:30pm or 7:00am -11:30am on weekends

## 2023-11-14 NOTE — ED Notes (Addendum)
 Pelvic binder applied to hips by nursing staff per MD Bero verbal order

## 2023-11-14 NOTE — H&P (Deleted)
 [name] 03-Feb-1955  [id number].    Requesting MD: Theadore, MD Chief Complaint/Reason for Consult: fall, right flank/low back hematoma   HPI:  [name] is a 68 y/o F with a PMH including, but not limted to, afib on Eliquis , CHF, HTN, and cardiac pacemaker placement who presents after a ground level fall. States she was cleaning up the floor yesterday afternoon when she lost her balance and ultimately fell backwards, hitting her right hip/back on a kitchen michaelfurt before falling to the floor. She does report hitting her head on the floor but denies LOC. She was helped up by her husband but presented to med center due to pain. She reports back, head (migraine), and left elbow pain. Her last dose of Eliquis  was 11/3 AM. She denies tobacco, alcohol , or drug use. Denies nausea or vomiting.   Trauma workup was significant for a hematoma of the right flank. Abdominal binder was ordered and the trauma and medical services have been asked to admit the patient for observation/further management.    ROS: Review of Systems  All other systems reviewed and are negative.   Family History  Problem Relation Age of Onset   Heart disease Mother    Heart failure Mother 69       CHF   Stroke Mother    Hypertension Mother    Varicose Veins Mother    Heart disease Father 41   Macular degeneration Father    Hearing loss Father    Vision loss Father    Heart attack Sister    Depression Sister    Heart disease Sister    Varicose Veins Sister    Atrial fibrillation Sister    Heart disease Sister    Arthritis Sister     Past Medical History:  Diagnosis Date   AF (paroxysmal atrial fibrillation) (HCC)    Allergy    Anxiety    Arthritis    BCC (basal cell carcinoma) 03/11/2003   left distal lower leg ankle-tx dr helga   Biventricular cardiac pacemaker in situ    a. prior CRT-D in 2013 with downgrade to CRT-Pacemaker only in 11/2016.   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 2012: normal coronaries, EF 25-30%. b. improved to 55% 11/2016.   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    Past Surgical History:  Procedure Laterality Date   ARTERY BIOPSY  12/29/2011   Procedure: MINOR BIOPSY TEMPORAL ARTERY;  Surgeon: Morene ONEIDA Olives, MD;  Location: [facility];  Service: General;  Laterality: Right;  Right temporal artery biopsy   BACK SURGERY  12/31/2009   for flat back syndrome; fused bottom of my back   BI-VENTRICULAR IMPLANTABLE CARDIOVERTER DEFIBRILLATOR Left 01/20/2011   Procedure: BI-VENTRICULAR IMPLANTABLE CARDIOVERTER DEFIBRILLATOR  (CRT-D);  Surgeon: Danelle LELON Birmingham, MD;  Location[facility] CATH LAB;  Service: Cardiovascular;  Laterality: Left;   BIV ICD GENERATOR CHANGEOUT N/A 11/24/2016   Procedure: BIV ICD GENERATOR CHANGEOUT;  Surgeon: Birmingham Danelle LELON, MD;  Location: [facility] INVASIVE CV LAB;  Service: Cardiovascular;  Laterality: N/A;   BREAST LUMPECTOMY WITH RADIOACTIVE SEED LOCALIZATION Left  01/22/2020   Procedure: LEFT BREAST LUMPECTOMY WITH RADIOACTIVE SEED LOCALIZATION;  Surgeon: Belinda Cough, MD;  Location: [facility];  Service: General;  Laterality: Left;  LMA   BREAST SURGERY     CERVICAL ABLATION  03/29/2023   CHOLECYSTECTOMY  ~1996   FOOT NEUROMA SURGERY  ~ 2003   right   RE-EXCISION OF BREAST LUMPECTOMY Left 03/03/2020   Procedure: RE-EXCISION MARGINS OF LEFT BREAST LUMPECTOMY;  Surgeon: Belinda Cough, MD;  Location: [facility];  Service: General;  Laterality: Left;   ROTATOR CUFF REPAIR  ~ 2009   left   SHOULDER ARTHROSCOPY W/ ROTATOR CUFF REPAIR Right 09/21/2011   SKIN CANCER EXCISION     nose, forehead, left leg   SPINE SURGERY     TEE WITHOUT CARDIOVERSION N/A 03/01/2017   Procedure: TRANSESOPHAGEAL ECHOCARDIOGRAM (TEE);  Surgeon: Raford Riggs, MD;  Location: [facility] ENDOSCOPY;  Service: Cardiovascular;  Laterality: N/A;   THYROID  LOBECTOMY  ~ 2006   right   TUBAL LIGATION  ~ 1983   VAGINAL HYSTERECTOMY  ~ 2009   VESICOVAGINAL FISTULA CLOSURE W/ TAH      Social History:  reports that she has never smoked. She has never been exposed to tobacco smoke. She has never used smokeless tobacco. She reports that she does not drink alcohol  and does not use drugs.  Allergies:  Allergies  Allergen Reactions   Other Nausea And Vomiting and Other (See Comments)    Most narcotic pain meds cause N/V and CHEST PAIN; needs an antiemetic to tolerate  Also, patient was diagnosed with Raynaud's disease and was told to NOT place IV(s) in either hand because of poor circulation   Iodine Other (See Comments)    Blisters   Tape Other (See Comments)    PATIENT'S SKIN IS VERY THIN; IT TEARS, BRUISES, AND BLEEDS VERY EASILY (PLEASE USE COBAN WRAP, IF POSSIBLE!!)   Codeine Other (See Comments)    Chest pain   Fentanyl  Other (See Comments)    Bad dreams   Morphine  And Codeine Nausea And Vomiting    (Not in a hospital  admission)    Physical Exam: Blood pressure 104/73, pulse 75, temperature 98 F (36.7 C), temperature source Oral, resp. rate 17, SpO2 95%. General: elderly female laying on hospital bed, appears stated age, NAD. HEENT: head -normocephalic, atraumatic; Eyes: PERRLA, no conjunctival injection Neck- Trachea is midline CV- RRR, normal S1/S2, no M/R/G, radial and dorsalis pedis pulses 2+ BL,  no lower extremity edema  Pulm- breathing is non-labored ORA. CTABL, no wheezes, rhales, rhonchi. Abd- soft, non-distended, fullness with mild ecchymosis of the right low back/flank. Abdominal binder is on but it is oversized and not tight.  GU- deferred  MSK- UE/LE symmetrical, no cyanosis, clubbing, or edema.  Grip strength 5/5 bilaterally, there is tenderness over the left elbow with mild swelling Neuro- CN II-XII grossly in tact, no paresthesias. Psych- Alert and Oriented x3 with appropriate affect Skin: warm and dry, no rashes or lesions   Results for orders placed or performed during the hospital encounter of 11/13/23 (from the past 48 hours)  CBC with Differential     Status: Abnormal   Collection Time: 11/13/23  9:39 PM  Result Value Ref Range   WBC 14.1 (H) 4.0 - 10.5 K/uL   RBC 4.46 3.87 - 5.11 MIL/uL   Hemoglobin 13.5 12.0 - 15.0 g/dL   HCT 59.8 63.9 - 53.9 %   MCV 89.9 80.0 - 100.0 fL   MCH 30.3 26.0 - 34.0 pg   MCHC 33.7 30.0 - 36.0 g/dL   RDW 87.5 88.4 - 84.4 %   Platelets 306 150 - 400 K/uL   nRBC 0.0 0.0 - 0.2 %   Neutrophils Relative % 71 %   Neutro Abs 10.0 (H) 1.7 - 7.7 K/uL   Lymphocytes Relative 18 %   Lymphs Abs 2.5 0.7 - 4.0 K/uL   Monocytes Relative 9 %   Monocytes Absolute 1.2 (H) 0.1 - 1.0 K/uL   Eosinophils Relative 1 %   Eosinophils Absolute 0.2 0.0 - 0.5 K/uL   Basophils Relative 1 %   Basophils Absolute 0.1 0.0 - 0.1 K/uL   Immature Granulocytes 0 %   Abs Immature Granulocytes 0.03 0.00 - 0.07 K/uL    Comment: Performed at [facility],  [address] [place], [place] [address]  Comprehensive metabolic panel     Status: Abnormal   Collection Time: 11/13/23  9:39 PM  Result Value Ref Range   Sodium 141 135 - 145 mmol/L   Potassium 3.9 3.5 - 5.1 mmol/L   Chloride 106 98 - 111 mmol/L   CO2 27 22 - 32 mmol/L   Glucose, Bld 107 (H) 70 - 99 mg/dL    Comment: Glucose reference range applies only to samples taken after fasting for at least 8 hours.   BUN 23 8 - 23 mg/dL   Creatinine, Ser 8.87 (H) 0.44 - 1.00 mg/dL   Calcium 9.4 8.9 - 89.6 mg/dL   Total Protein 6.4 (L) 6.5 - 8.1 g/dL   Albumin  4.1 3.5 - 5.0 g/dL   AST 27 15 - 41 U/L   ALT 20 0 - 44 U/L   Alkaline Phosphatase 89 38 - 126 U/L   Total Bilirubin 0.3 0.0 - 1.2 mg/dL   GFR, Estimated 53 (L) >60 mL/min    Comment: (NOTE) Calculated using the CKD-EPI Creatinine Equation (2021)    Anion gap 8 5 - 15    Comment: Performed at [facility], [address] [place], [place] [address]  CBC     Status: Abnormal   Collection Time: 11/14/23  7:37 AM  Result Value Ref Range   WBC 8.8 4.0 - 10.5 K/uL   RBC 3.32 (L) 3.87 - 5.11 MIL/uL   Hemoglobin 10.0 (L) 12.0 - 15.0 g/dL    Comment: REPEATED TO VERIFY   HCT 30.0 (L) 36.0 - 46.0 %   MCV 90.4 80.0 - 100.0 fL   MCH 30.1 26.0 - 34.0 pg   MCHC 33.3 30.0 - 36.0 g/dL   RDW 87.2 88.4 - 84.4 %   Platelets 239 150 - 400 K/uL   nRBC 0.0 0.0 - 0.2 %    Comment: Performed at [facility],  [address], [place], [place] [address]   CT Head Wo Contrast Result Date: 11/14/2023 EXAM: CT HEAD WITHOUT CONTRAST 11/14/2023 10:07:00 AM TECHNIQUE: CT of the head was performed without the administration of intravenous contrast. Automated exposure control, iterative reconstruction, and/or weight based adjustment of the mA/kV was utilized to reduce the radiation dose to as low as reasonably achievable. COMPARISON: Head CT yesterday. CT 06/24/2013. CLINICAL HISTORY: 68 year old female. Head trauma,  moderate-severe. FINDINGS: BRAIN AND VENTRICLES: No acute hemorrhage. No evidence of acute infarct. No hydrocephalus. No extra-axial collection. No mass effect or midline shift. Brain volume is stable and normal for age. Gray white differentiation stable and normal for age. No suspicious intracranial vascular hyperdensity. ORBITS: No acute abnormality. SINUSES: Chronic bilateral sphenoid sinus disease, chronic right posterior ethmoid sinus disease. Chronic mucoperiosteal thickening on the right. Evolution of left sphenoid sinus changes since 2015 also most compatible with chronic mucoperiosteal thickening. Bubbly opacity also on that side. No complicating features identified. Other paranasal sinuses, middle ears and mastoids remain well aerated. SOFT TISSUES AND SKULL: No acute soft tissue abnormality. No skull fracture. IMPRESSION: 1. Normal for age non-contrast CT appearance of the brain. 2. Advanced sphenoid sinus disease with acute on chronic features, no complication. Electronically signed by: Helayne Hurst MD 11/14/2023 10:14 AM EST RP Workstation: [id number]   CT T-SPINE NO CHARGE Result Date: 11/13/2023 EXAM: CT THORACIC SPINE WITHOUT CONTRAST 11/13/2023 10:37:38 PM TECHNIQUE: CT of the thoracic spine was performed without the administration of intravenous contrast. Multiplanar reformatted images are provided for review. Automated exposure control, iterative reconstruction, and/or weight based adjustment of the mA/kV was utilized to reduce the radiation dose to as low as reasonably achievable. COMPARISON: None available. CLINICAL HISTORY: FINDINGS: BONES AND ALIGNMENT: Thoracolumbar to sacrum and iliac surgical hardware. Right thoracolumbar scoliosis. No CT finding to suggest surgical hardware acute complication. Diffusely decreased bone density. Chronic L2 compression fracture stable. No acute fracture or suspicious bone lesion. DEGENERATIVE CHANGES: No significant degenerative changes. SOFT TISSUES: No  acute abnormality. IMPRESSION: 1. Postoperative thoracolumbar to sacrum and iliac hardware without CT evidence of acute complication. No acute abnormality of the thoracolumbar spine. 2. Diffusely decreased bone density. Electronically signed by: Morgane Naveau MD 11/13/2023 11:15 PM EST RP Workstation: [id number]   CT L-SPINE NO CHARGE Result Date: 11/13/2023 EXAM: CT OF THE LUMBAR SPINE WITHOUT CONTRAST 11/13/2023 10:37:38 PM TECHNIQUE: CT of the lumbar spine was performed without the administration of intravenous contrast. Multiplanar reformatted images are provided for review. Automated exposure control, iterative reconstruction, and/or weight based adjustment of the mA/kV was utilized to reduce the radiation dose to as low as reasonably achievable. COMPARISON:N None available. CLINICAL HISTORY: FINDINGS: BONES AND ALIGNMENT: Thoracolumbar to sacrum and iliac surgical hardware. Right thoracolumbar scoliosis. Diffusely decreased bone density. Chronic L2 compression fracture stable. No CT finding to suggest surgical hardware acute complication. Normal vertebral body heights (except L2). No acute fracture or suspicious bone lesion. DEGENERATIVE CHANGES: No significant degenerative changes. SOFT TISSUES: No acute abnormality. IMPRESSION: 1. Thoracolumbar to sacrum and iliac surgical hardware without CT evidence of acute complication. Electronically signed by: Morgane Naveau MD 11/13/2023 11:14 PM EST RP Workstation: [id number]   CT CHEST ABDOMEN PELVIS W CONTRAST Result Date: 11/13/2023 EXAM: CT CHEST, ABDOMEN AND PELVIS WITH CONTRAST 11/13/2023 10:37:38 PM TECHNIQUE: CT of the chest, abdomen and pelvis was performed with the administration of iohexol  (OMNIPAQUE ) 300 MG/ML solution. Multiplanar reformatted images are provided for review. Automated exposure control, iterative reconstruction, and/or weight based adjustment of  the mA/kV was utilized to reduce the radiation dose to as low as reasonably  achievable. COMPARISON: None available. CLINICAL HISTORY: Polytrauma, blunt. FINDINGS: CHEST: MEDIASTINUM AND LYMPH NODES: Heart and pericardium are unremarkable. The central airways are clear. No mediastinal, hilar or axillary lymphadenopathy. No mediastinal hematoma. No pneumomediastinum. LUNGS AND PLEURA: No focal consolidation or pulmonary edema. No pleural effusion or pneumothorax. ABDOMEN AND PELVIS: LIVER: The liver is unremarkable. GALLBLADDER AND BILE DUCTS: Status post cholecystectomy. No biliary ductal dilatation. SPLEEN: No acute abnormality. PANCREAS: No acute abnormality. ADRENAL GLANDS: No acute abnormality. KIDNEYS, URETERS AND BLADDER: No stones in the kidneys or ureters. No hydronephrosis. No perinephric or periureteral stranding. Urinary bladder is unremarkable. GI AND BOWEL: Stomach demonstrates no acute abnormality. There is no bowel obstruction. The appendix is unremarkable. REPRODUCTIVE ORGANS: Status post hysterectomy. No adnexal mass. PERITONEUM AND RETROPERITONEUM: No ascites. No free air. No mesenteric hematoma. VASCULATURE: Aorta is normal in caliber. ABDOMINAL AND PELVIS LYMPH NODES: No lymphadenopathy. BONES AND SOFT TISSUES: Left chest wall dual cardiac pacemaker and defibrillator. Please see separately dictated CT thoracolumbar spine. No acute bilateral hip or pelvic abnormalities fracture. No acute displaced rib fracture. No acute displaced sternal fracture. No sacral fracture. 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 Right medial soft tissue acute hematoma measuring up to 13 x 6 cm. IMPRESSION: 1. Right medial soft tissue acute hematoma measuring up to 13 x 6 cm. No mesenteric or mediastinal hematoma. 2. No acute intrathoracic, intraabdominal, or intrapelvic abnormality. Electronically signed by: Morgane Naveau MD 11/13/2023 11:12 PM EST RP Workstation: [id number]   CT Head Wo Contrast Result Date: 11/13/2023 EXAM: CT HEAD AND CERVICAL SPINE  11/13/2023 10:37:38 PM TECHNIQUE: CT of the head and cervical spine was performed without the administration of intravenous contrast. Multiplanar reformatted images are provided for review. Automated exposure control, iterative reconstruction, and/or weight based adjustment of the mA/kV was utilized to reduce the radiation dose to as low as reasonably achievable. COMPARISON: CT cervical spine 09/23/2022. CT head 06/24/2013 CLINICAL HISTORY: Head trauma, moderate-severe FINDINGS: CT HEAD BRAIN AND VENTRICLES: No acute intracranial hemorrhage. No mass effect or midline shift. No abnormal extra-axial fluid collection. No evidence of acute infarct. No hydrocephalus. ORBITS: No acute abnormality. SINUSES AND MASTOIDS: Chronically opacified and expanded right posterior ethmoid air cells and sphenoid sinus. No mastoid effusions. SOFT TISSUES AND SKULL: No acute skull fracture. No acute soft tissue abnormality. CT CERVICAL SPINE BONES AND ALIGNMENT: No acute fracture or traumatic malalignment. Unchanged dextrocurvature. DEGENERATIVE CHANGES: Multilevel degenerative change including likely moderate foraminal stenosis bilaterally at C5-C6 and C6-C7. SOFT TISSUES: No prevertebral soft tissue swelling. IMPRESSION: 1. No acute intracranial abnormality. 2. No acute fracture or traumatic malalignment of the cervical spine. 3. Chronically opacified and expanded right posterior ethmoid air cells and sphenoid sinus, suggestive of mucocele. Electronically signed by: Gilmore Molt MD 11/13/2023 11:04 PM EST RP Workstation: [id number]   CT Cervical Spine Wo Contrast Result Date: 11/13/2023 EXAM: CT HEAD AND CERVICAL SPINE 11/13/2023 10:37:38 PM TECHNIQUE: CT of the head and cervical spine was performed without the administration of intravenous contrast. Multiplanar reformatted images are provided for review. Automated exposure control, iterative reconstruction, and/or weight based adjustment of the mA/kV was utilized to reduce the  radiation dose to as low as reasonably achievable. COMPARISON: CT cervical spine 09/23/2022. CT head 06/24/2013 CLINICAL HISTORY: Head trauma, moderate-severe FINDINGS: CT HEAD BRAIN AND VENTRICLES: No acute intracranial hemorrhage. No mass effect or midline shift. No abnormal extra-axial fluid  collection. No evidence of acute infarct. No hydrocephalus. ORBITS: No acute abnormality. SINUSES AND MASTOIDS: Chronically opacified and expanded right posterior ethmoid air cells and sphenoid sinus. No mastoid effusions. SOFT TISSUES AND SKULL: No acute skull fracture. No acute soft tissue abnormality. CT CERVICAL SPINE BONES AND ALIGNMENT: No acute fracture or traumatic malalignment. Unchanged dextrocurvature. DEGENERATIVE CHANGES: Multilevel degenerative change including likely moderate foraminal stenosis bilaterally at C5-C6 and C6-C7. SOFT TISSUES: No prevertebral soft tissue swelling. IMPRESSION: 1. No acute intracranial abnormality. 2. No acute fracture or traumatic malalignment of the cervical spine. 3. Chronically opacified and expanded right posterior ethmoid air cells and sphenoid sinus, suggestive of mucocele. Electronically signed by: Gilmore Molt MD 11/13/2023 11:04 PM EST RP Workstation: [id number]   DG Elbow Complete Left Result Date: 11/13/2023 EXAM: 3 VIEW(S) XRAY OF THE LEFT ELBOW COMPARISON: None available. CLINICAL HISTORY: fall, pain FINDINGS: BONES AND JOINTS: Left elbow joint effusion noted. No visible fracture but question of an occult fracture given the effusion. No joint dislocation. SOFT TISSUES: The soft tissues are unremarkable. IMPRESSION: 1. Left elbow joint effusion concerning for an occult fracture. 2. No visible fracture identified. Consider immobilization and repeat imaging in 1 week if symptoms persist. Electronically signed by: Franky Crease MD 11/13/2023 10:34 PM EST RP Workstation: [id number]    Assessment/Plan 68 y/o F s/p ground level fall Right flank hematoma - hold  Eliquis , continue abdominal binder, trend hgb, transfuse PRN; asked RN to obtain new abd binder that is appropriate size ABL anemia - due to above; hgb 10.0 from 13.5 yesterday, repeat ordered for 1500. Discussed with Dr. Paola -  If hgb stable then we can continue to monitor at [facility] long, if hgb continues to drop then the patient will require transfer to designated trauma center [facility]) Left elbow joint effusion - sling, ortho c/s   FEN - ok for a solid diet from a trauma persepctive VTE - SCD's, hold chemical VTE ppx ID - none indicated Admit - to TRH service for MMP   Afib on Eliquis   Hx splenic infarction HTN CHF LBBB History of biventricular pacemaker Essential tremor Generalized anxiety disorder Post-surgical hypothyroidism  Migraine HA  Osteoporosis Back pain  I reviewed nursing notes, ED provider notes, hospitalist notes, last 24 h vitals and pain scores, last 48 h intake and output, last 24 h labs and trends, and last 24 h imaging results.  Almarie Pringle, [facility] 11/14/2023, 10:43 AM Please see Amion for pager number during day hours 7:00am-4:30pm or 7:00am -11:30am on weekends

## 2023-11-14 NOTE — ED Notes (Signed)
 Report given and paperwork signed by prev RN.[email or web address]

## 2023-11-14 NOTE — ED Notes (Signed)
Abdominal binder applied.

## 2023-11-14 NOTE — ED Notes (Signed)
 Natay with cl called for transport

## 2023-11-14 NOTE — Plan of Care (Signed)
   Problem: Education: Goal: Knowledge of General Education information will improve Description Including pain rating scale, medication(s)/side effects and non-pharmacologic comfort measures Outcome: Progressing

## 2023-11-14 NOTE — Telephone Encounter (Signed)
 FYI Only or Action Required?: Action required by provider: clinical question for provider.  Patient was last seen in primary care on 10/27/2023 by Jerrell Cleatus Ned, MD.  Called Nurse Triage reporting Fall and Advice Only.  Symptoms began information only call.  Interventions attempted: Other: information only call.  Symptoms are: information only call.  Triage Disposition: Call PCP When Office is Open  Patient/caregiver understands and will follow disposition?: Yes                           Copied from CRM [id number]. Topic: Clinical - Red Word Triage >> Nov 14, 2023  1:21 PM Eva FALCON wrote: Red Word that prompted transfer to Nurse Triage: pt fell yesterday, hurt her arm and hip, states she is bruised badly. Is currently at [facility] after being transferred from [facility], states she has not been helped and is not sure what to do, she is in severe pain, states if they have to operate she does not want to be operated at [facility].  Reason for Disposition  [1] Caller requesting NON-URGENT health information AND [2] PCP's office is the best resource  Answer Assessment - Initial Assessment Questions 1. REASON FOR CALL: What is the main reason for your call? or How can I best help you?     Patient had a fall yesterday and went to [facility] ED. Patient was transferred to and is currently at [facility]. Patient asked this RN if there was anyway she could leave and be seen in the office. This RN advised patient that she should stay at the hospital until she can be appropriately treated and discharged. Patient verbalized understanding and proceeded to ask if there is anything her PCP can do to get her transferred to [facility]. Please advise.  Protocols used: Information Only Call - No Triage-A-AH

## 2023-11-14 NOTE — Progress Notes (Signed)
 Hospitalist Transfer Note:    Nursing staff, Please call [facility] Admits & Consults System-Wide number on Amion [phone]) as soon as patient's arrival, so appropriate admitting provider can evaluate the pt.   Transferring facility: [facility] Requesting provider: Dr. Theadore (EDP at [facility]) Reason for transfer: admission for further evaluation and management of acute soft tissue hematoma over the right portion of the abdomen after ground-level mechanical fall at home.    68 year old female with history of paroxysmal atrial fibrillation, chronically anticoagulated on Eliquis , who presented to [facility] ED complaining of right sided abdominal discomfort after ground-level mechanical fall at home, in which the patient tripped while ambulating in the kitchen.  She did not hit her head as component of this fall, nor was there any associated loss of consciousness.  She is chronically anticoagulated on Eliquis  in the setting of a history of paroxysmal atrial fibrillation.  She notes that she struck the right portion of her abdomen has a component of the above fall.  Otherwise, denies any acute arthralgias or myalgias resulting from this fall.  Vital signs in the ED were notable for the following: Afebrile; heart rates in the high 50s to 60s; after dose of Norco, the patient's blood pressure transiently decreased into the 70s, but is improved with 1 L of normal saline, with most recent systolic blood pressures noted to be in the low 100s, with most recent blood pressure noted to be 104/68; respiratory rate 15-17, and oxygen saturation 95 to 100% on room air.  Labs were notable for CBC, which showed hemoglobin of 13.5, which is reported to be consistent with her baseline.  Imaging notable for CT chest, abdomen, pelvis with contrast, which showed evidence of acute soft tissue hematoma along the right medial portion of the abdomen, without evidence of active contrast extravasation.  Otherwise, no evidence of acute traumatic  injury.   EDP d/w on-call general surgery, Dr. Rubin, who recommended observation to the Hospitalists for further trending of Hgb as well as conservative measures, including application of abdominal binder. Dr. Rubin conveyed that there is not currently any indication for surgical intervention, including no indication for involvement of IR for hematoma evacuation at this time, but that general surgery is available, as needed, if formal consult is subsequently needed.   Medications administered prior to transfer included the following: Normal saline x 1 L bolus followed by continuous lactated Ringer 's at 100 cc/h.   Subsequently, I accepted this patient for transfer for observation to a pcu bed at [facility] or [facility] (first available) for further work-up and management of the above.     Eva Pore, DO Hospitalist

## 2023-11-14 NOTE — H&P (Signed)
 History and Physical    Patient: [name] FMW:[id number] DOB: 08/03/1955 DOA: 11/13/2023 DOS: the patient was seen and examined on 11/14/2023 PCP: Jerrell Cleatus Ned, MD  Patient coming from: Home  Chief Complaint:  Chief Complaint  Patient presents with   Fall   HPI: [name] is a 68 y.o. 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  She also stated that she injured her left elbow and hit her occipital area as well.  She denied any prodromal symptoms and stated that her fall was mechanical.  She denied fever, chills, rhinorrhea, sore throat, wheezing or hemoptysis.  No chest pain, palpitations, diaphoresis, PND, orthopnea or recent pitting edema of the lower extremities.  No abdominal pain, nausea, emesis, diarrhea, constipation, melena or hematochezia.  No dysuria, frequency or hematuria.  No polyuria, polydipsia, polyphagia or blurred vision.   ED course: Initial vital signs were temperature 98.7 F, pulse 65, respirations 16, BP 134/61 mmHg and O2 sat 100% on room air.  The patient received metoclopramide  10 mg IVP, ondansetron  4 mg IVP x 2 and 1500 mL of normal saline bolus.  Lab work: Initial CBC showed a white count of 14.1 with 71% neutrophils, hemoglobin 13.5 g/dL and platelets 693.  CBC #2 showed a white count of 8.8, hemoglobin 10.0 g/dL and platelets 760.  CMP showed a  total protein of 6.4 g/dL, glucose of 892 and creatinine 1.12 mg/dL, the rest of the CMP was normal.  Imaging: CT head without contrast no acute intracranial normality.  CT cervical spine no acute fracture or traumatic malalignment.  There are chronically opacified in the spine the right posterior ethmoid air cells and the sphenoid sinus, suggesting mucocele.  CT of the thoracic/lumbosacral spine showing postoperative thoracolumbar to sacrum and iliac hardware without CT evidence of acute complication.  Diffusely decreased bone density.  Right medial soft tissue acute hematoma measuring up to 13 x 6 cm.  No mesenteric or mediastinal hematoma.   Review of Systems: As mentioned in the history of present illness. All other systems reviewed and are negative. Past Medical History:  Diagnosis Date   AF (paroxysmal atrial fibrillation) (HCC)    Allergy    Anxiety    Arthritis    BCC (basal cell carcinoma) 03/11/2003   left distal lower leg ankle-tx dr helga   Biventricular cardiac pacemaker in situ    a. prior CRT-D in 2013 with downgrade to CRT-Pacemaker only in 11/2016.   Breast cancer (HCC) 12/12/2019   Cataract    Chronic systolic CHF (congestive heart failure) (HCC)    Complication of anesthesia    aspiration   Depression    Essential tremor    GERD (gastroesophageal reflux disease)    otc   Hypertension    Hypothyroidism    Idiopathic scoliosis and kyphoscoliosis    Extensive repair with Harrington Rods December 2011 in [place], [place]  IMO SNOMED Dx Update Oct 2024     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/17/2010   LBBB (left bundle branch block)    conduction disorder  Lumbar canal stenosis 12/25/2009   Migraines    Mild aortic insufficiency    Neuromuscular disorder (HCC)    NICM (nonischemic cardiomyopathy) (HCC)    a. 2012: normal coronaries, EF 25-30%. b. improved to 55% 11/2016.   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   Past Surgical History:  Procedure Laterality Date   ARTERY BIOPSY  12/29/2011   Procedure: MINOR BIOPSY TEMPORAL ARTERY;  Surgeon: Morene ONEIDA Olives, MD;  Location: [facility];  Service: General;  Laterality: Right;  Right temporal artery biopsy   BACK SURGERY  12/31/2009   for flat back syndrome; fused bottom of my back   BI-VENTRICULAR IMPLANTABLE CARDIOVERTER DEFIBRILLATOR Left 01/20/2011   Procedure: BI-VENTRICULAR IMPLANTABLE CARDIOVERTER DEFIBRILLATOR  (CRT-D);  Surgeon: Danelle LELON Birmingham, MD;  Location: [facility] CATH LAB;  Service: Cardiovascular;  Laterality: Left;   BIV ICD GENERATOR CHANGEOUT N/A 11/24/2016   Procedure: BIV ICD GENERATOR CHANGEOUT;  Surgeon: Birmingham Danelle LELON, MD;  Location: [facility] INVASIVE CV LAB;  Service: Cardiovascular;  Laterality: N/A;   BREAST LUMPECTOMY WITH RADIOACTIVE SEED LOCALIZATION Left 01/22/2020   Procedure: LEFT BREAST LUMPECTOMY WITH RADIOACTIVE SEED LOCALIZATION;  Surgeon: Belinda Cough, MD;  Location: [facility];  Service: General;  Laterality: Left;  LMA   BREAST SURGERY     CERVICAL ABLATION  03/29/2023   CHOLECYSTECTOMY  ~1996   FOOT NEUROMA SURGERY  ~ 2003   right   RE-EXCISION OF BREAST LUMPECTOMY Left 03/03/2020   Procedure: RE-EXCISION MARGINS OF LEFT BREAST LUMPECTOMY;  Surgeon: Belinda Cough, MD;  Location: [facility];  Service: General;  Laterality: Left;   ROTATOR CUFF REPAIR  ~ 2009   left   SHOULDER ARTHROSCOPY W/ ROTATOR CUFF REPAIR Right 09/21/2011   SKIN CANCER EXCISION     nose, forehead, left leg   SPINE SURGERY     TEE WITHOUT CARDIOVERSION N/A 03/01/2017   Procedure: TRANSESOPHAGEAL ECHOCARDIOGRAM (TEE);   Surgeon: Raford Riggs, MD;  Location: [facility] ENDOSCOPY;  Service: Cardiovascular;  Laterality: N/A;   THYROID  LOBECTOMY  ~ 2006   right   TUBAL LIGATION  ~ 1983   VAGINAL HYSTERECTOMY  ~ 2009   VESICOVAGINAL FISTULA CLOSURE W/ TAH     Social History:  reports that she has never smoked. She has never been exposed to tobacco smoke. She has never used smokeless tobacco. She reports that she does not drink alcohol  and does not use drugs.  Allergies  Allergen Reactions   Other Nausea And Vomiting and Other (See Comments)    Most narcotic pain meds cause N/V and CHEST PAIN; needs an antiemetic to tolerate  Also, patient was diagnosed with Raynaud's disease and was told to NOT place IV(s) in either hand because of poor circulation   Iodine Other (See Comments)    Blisters   Tape Other (See Comments)    PATIENT'S SKIN IS VERY THIN; IT TEARS, BRUISES, AND BLEEDS VERY EASILY (PLEASE USE COBAN WRAP, IF POSSIBLE!!)   Codeine Other (See Comments)    Chest pain   Fentanyl  Other (See Comments)    Bad dreams   Morphine  And Codeine Nausea And Vomiting  Family History  Problem Relation Age of Onset   Heart disease Mother    Heart failure Mother 28       CHF   Stroke Mother    Hypertension Mother    Varicose Veins Mother    Heart disease Father 73   Macular degeneration Father    Hearing loss Father    Vision loss Father    Heart attack Sister    Depression Sister    Heart disease Sister    Varicose Veins Sister    Atrial fibrillation Sister    Heart disease Sister    Arthritis Sister     Prior to Admission medications   Medication Sig Start Date End Date Taking? Authorizing Provider  albuterol  (VENTOLIN  HFA) 108 (90 Base) MCG/ACT inhaler Inhale 2 puffs into the lungs every 6 (six) hours as needed for wheezing or shortness of breath. 10/27/23   Jerrell Cleatus Ned, MD  anastrozole  (ARIMIDEX ) 1 MG tablet Take 1 tablet (1 mg total) by mouth daily. 09/04/23   Cloretta Arley NOVAK, MD   apixaban  (ELIQUIS ) 5 MG TABS tablet TAKE 1 TABLET TWICE DAILY 08/28/23   Lonni Slain, MD  eletriptan  (RELPAX ) 40 MG tablet Take 1 tablet (40 mg total) by mouth every 2 (two) hours as needed for migraine or headache. May repeat in 2 hours if headache persists or recurs. 09/12/23   Caudle, Thersia Bitters, FNP  gabapentin  (NEURONTIN ) 600 MG tablet Take 600 mg by mouth 2 (two) times daily.    [provider]  levothyroxine  (SYNTHROID ) 100 MCG tablet Take 1 tablet (100 mcg total) by mouth daily before breakfast. 10/27/23   Jerrell Cleatus Ned, MD  omeprazole  (PRILOSEC) 40 MG capsule Take 1 capsule (40 mg total) by mouth daily. 08/10/23   Caudle, Thersia Bitters, FNP  propranolol  (INDERAL ) 60 MG tablet Take 1 tablet (60 mg total) by mouth daily. 11/10/20   Leverne Charlies Helling, PA-C  spironolactone  (ALDACTONE ) 25 MG tablet Take 0.5 tablets (12.5 mg total) by mouth daily. 02/23/23   Waddell Danelle ORN, MD  traZODone (DESYREL) 50 MG tablet Take 0.5 tablets (25 mg total) by mouth at bedtime as needed for sleep. 10/27/23   Jerrell Cleatus Ned, MD    Physical Exam: Vitals:   11/14/23 0830 11/14/23 0852 11/14/23 0854 11/14/23 1030  BP: 94/62 104/73  110/73  Pulse: 73 75  84  Resp: 17  17 15   Temp:  98 F (36.7 C)    TempSrc:  Oral    SpO2: 100% 95%  96%   Physical Exam Vitals and nursing note reviewed.  Constitutional:      Appearance: She is ill-appearing.  HENT:     Head: Normocephalic.     Nose: No rhinorrhea.     Mouth/Throat:     Mouth: Mucous membranes are dry.  Eyes:     General: No scleral icterus.    Pupils: Pupils are equal, round, and reactive to light.  Cardiovascular:     Rate and Rhythm: Normal rate and regular rhythm.  Pulmonary:     Effort: Pulmonary effort is normal.  Abdominal:     General: Bowel sounds are normal. There is no distension.     Palpations: Abdomen is soft.     Tenderness: There is no abdominal tenderness. There is right CVA tenderness. There  is no left CVA tenderness.  Musculoskeletal:     Cervical back: Neck supple.     Right lower leg: No edema.     Left lower  leg: No edema.  Skin:    General: Skin is warm and dry.     Findings: Bruising present.     Comments: Ecchymosis on right flank area.  Neurological:     General: No focal deficit present.     Mental Status: She is alert.  Psychiatric:        Mood and Affect: Mood normal.        Behavior: Behavior normal.     Data Reviewed:  Results are pending, will review when available. 08/19/2021 echocardiogram report. IMPRESSIONS:   1. Left ventricular ejection fraction, by estimation, is 60 to 65%. The  left ventricle has normal function. The left ventricle has no regional  wall motion abnormalities. Left ventricular diastolic parameters are  consistent with Grade I diastolic  dysfunction (impaired relaxation). The average left ventricular global  longitudinal strain is -23.2 %. The global longitudinal strain is normal.   2. Right ventricular systolic function is normal. The right ventricular  size is normal. There is normal pulmonary artery systolic pressure. The  estimated right ventricular systolic pressure is 26.2 mmHg.   3. A small pericardial effusion is present. The pericardial effusion is  posterior to the left ventricle.   4. The mitral valve is grossly normal. Trivial mitral valve  regurgitation.   5. The aortic valve is tricuspid. Aortic valve regurgitation is mild.  Aortic valve sclerosis/calcification is present, without any evidence of  aortic stenosis.   6. Aortic dilatation noted. There is borderline dilatation of the  ascending aorta, measuring 38 mm.   7. The inferior vena cava is normal in size with greater than 50%  respiratory variability, suggesting right atrial pressure of 3 mmHg.   Comparison(s): 08/12/20 EF 50-55%. GLS -22.9%.   Assessment and Plan: Principal Problem:   Hematoma Complicated by:   ABLA (acute blood loss anemia)   Observation/PCU. Hold apixaban . Analgesics as needed. Antiemetics as needed. Continue abdominal binder. Monitor hematocrit and hemoglobin. Transfuse PRBC as needed. Trauma surgery consult appreciated.  Active Problems:   Left elbow contusion  Analgesics as needed. Continue arm sling.    Paroxysmal atrial fibrillation (HCC) Hold DOAC at this time.    Ductal carcinoma in situ (DCIS) of left breast Continue anastrozole  1 mg p.o. daily.    Benign essential tremor On propranolol . Awaiting med reconciliation    Post-surgical hypothyroidism Continue levothyroxine  100 mcg p.o. daily.     Advance Care Planning:   Code Status: Full Code   Consults: [facility]/Trauma Surgery.  Family Communication: Her husband was at bedside.  Severity of Illness: The appropriate patient status for this patient is OBSERVATION. Observation status is judged to be reasonable and necessary in order to provide the required intensity of service to ensure the patient's safety. The patient's presenting symptoms, physical exam findings, and initial radiographic and laboratory data in the context of their medical condition is felt to place them at decreased risk for further clinical deterioration. Furthermore, it is anticipated that the patient will be medically stable for discharge from the hospital within 2 midnights of admission.   Author: Alm Dorn Castor, MD 11/14/2023 11:20 AM  For on call review [email or web address].   This document was prepared using Dragon voice recognition software and may contain some unintended transcription errors.

## 2023-11-14 NOTE — ED Notes (Signed)
 Patient complaining of increased headache. She thinks it is a migraine and is requesting her migraine mediation. She is also requesting nausea medication now. She hit her head in the fall and is on Eliquis . Notified Dr. Lenor. She will order a repeat CT head.

## 2023-11-14 NOTE — ED Provider Notes (Addendum)
 Patient is here waiting for a bed assignment.  Her blood pressures have been soft, dropping down to the 80s.  She was given a second fluid bolus.  Her hemoglobin has dropped from 12-10.  Will plan to do an ED to ED transfer while waiting for bed assignment.  Discussed with Dr. Randol who has accepted the patient.  Will transfer to [facility], ED.  She did have a worsening headache.  Repeat head CT does not show any intracranial hemorrhage.   Lenor Hollering, MD 11/14/23 [id number]    Lenor Hollering, MD 11/14/23 1027

## 2023-11-14 NOTE — Telephone Encounter (Signed)
 Patient wanted PCP to help get her transferred over to [facility], I did state to patient we have no control over this and she would need to request a transfer herself to the staff there. Patient verbalized understanding.

## 2023-11-15 DIAGNOSIS — T148XXA Other injury of unspecified body region, initial encounter: Secondary | ICD-10-CM | POA: Diagnosis not present

## 2023-11-15 DIAGNOSIS — S300XXA Contusion of lower back and pelvis, initial encounter: Secondary | ICD-10-CM | POA: Diagnosis not present

## 2023-11-15 DIAGNOSIS — I480 Paroxysmal atrial fibrillation: Secondary | ICD-10-CM | POA: Diagnosis not present

## 2023-11-15 DIAGNOSIS — Z7901 Long term (current) use of anticoagulants: Secondary | ICD-10-CM | POA: Diagnosis not present

## 2023-11-15 DIAGNOSIS — S5002XA Contusion of left elbow, initial encounter: Secondary | ICD-10-CM | POA: Diagnosis not present

## 2023-11-15 DIAGNOSIS — D62 Acute posthemorrhagic anemia: Secondary | ICD-10-CM | POA: Diagnosis not present

## 2023-11-15 LAB — CBC
HCT: 31 % — ABNORMAL LOW (ref 36.0–46.0)
Hemoglobin: 10 g/dL — ABNORMAL LOW (ref 12.0–15.0)
MCH: 30 pg (ref 26.0–34.0)
MCHC: 32.3 g/dL (ref 30.0–36.0)
MCV: 93.1 fL (ref 80.0–100.0)
Platelets: 156 K/uL (ref 150–400)
RBC: 3.33 MIL/uL — ABNORMAL LOW (ref 3.87–5.11)
RDW: 12.6 % (ref 11.5–15.5)
WBC: 6.6 K/uL (ref 4.0–10.5)
nRBC: 0 % (ref 0.0–0.2)

## 2023-11-15 LAB — COMPREHENSIVE METABOLIC PANEL WITH GFR
ALT: 17 U/L (ref 0–44)
AST: 24 U/L (ref 15–41)
Albumin: 3.3 g/dL — ABNORMAL LOW (ref 3.5–5.0)
Alkaline Phosphatase: 69 U/L (ref 38–126)
Anion gap: 7 (ref 5–15)
BUN: 18 mg/dL (ref 8–23)
CO2: 25 mmol/L (ref 22–32)
Calcium: 8.5 mg/dL — ABNORMAL LOW (ref 8.9–10.3)
Chloride: 109 mmol/L (ref 98–111)
Creatinine, Ser: 0.92 mg/dL (ref 0.44–1.00)
GFR, Estimated: >60 mL/min (ref >60)
Glucose, Bld: 87 mg/dL (ref 70–99)
Potassium: 3.8 mmol/L (ref 3.5–5.1)
Sodium: 141 mmol/L (ref 135–145)
Total Bilirubin: 0.5 mg/dL (ref 0.0–1.2)
Total Protein: 5.6 g/dL — ABNORMAL LOW (ref 6.5–8.1)

## 2023-11-15 LAB — HIV ANTIBODY (ROUTINE TESTING W REFLEX): HIV Screen 4th Generation wRfx: NONREACTIVE

## 2023-11-15 MED ORDER — PROPRANOLOL HCL 20 MG PO TABS
60.0000 mg | ORAL_TABLET | Freq: Every day | ORAL | Status: DC
  Administered 2023-11-16: 60 mg via ORAL
  Filled 2023-11-15: qty 6

## 2023-11-15 MED ORDER — ELETRIPTAN HYDROBROMIDE 40 MG PO TABS
40.0000 mg | ORAL_TABLET | Freq: Once | ORAL | Status: AC
  Administered 2023-11-15: 40 mg via ORAL
  Filled 2023-11-15: qty 1

## 2023-11-15 NOTE — Progress Notes (Signed)
 [facility] Progress Note     Subjective: CC: feeling better. Mild headache. Has walked to bathroom with walker. Voiding without reported sxs. +BM. Sitting up eating breakfast.  At baseline she lives at home with her husband whom she says is in good health.  She tells me that she has a cane and a walker at home.  Objective: Vital signs in last 24 hours: Temp:  [97.3 F (36.3 C)-98.3 F (36.8 C)] 98 F (36.7 C) (11/05 0508) Pulse Rate:  [78-103] 80 (11/05 0508) Resp:  [15-17] 16 (11/05 0508) BP: (92-112)/(51-73) 92/61 (11/05 0508) SpO2:  [96 %-100 %] 99 % (11/05 0508) Weight:  [73.5 kg] 73.5 kg (11/04 1753)    Intake/Output from previous day: 11/04 0701 - 11/05 0700 In: 1500 [I.V.:1000; IV Piggyback:500] Out: -  Intake/Output this shift: Total I/O In: 240 [P.O.:240] Out: -   PE: Gen:  Alert, NAD, pleasant Card:  Regular rate and rhythm Pulm:  Normal effort ORA Abd: Soft, non-tender, non-distended Skin: warm and dry, there is scattered ecchymosis of the lower back, sacrum, and buttocks, fullness over the right hip/buttock consistent with hematoma.  No cellulitis. Psych: A&Ox3   Lab Results:  Recent Labs    11/14/23 0737 11/14/23 1212 11/14/23 2119 11/15/23 0530  WBC 8.8  --   --  6.6  HGB 10.0*   < > 10.3* 10.0*  HCT 30.0*   < > 31.8* 31.0*  PLT 239  --   --  156   < > = values in this interval not displayed.   BMET Recent Labs    11/13/23 2139 11/15/23 0530  NA 141 141  K 3.9 3.8  CL 106 109  CO2 27 25  GLUCOSE 107* 87  BUN 23 18  CREATININE 1.12* 0.92  CALCIUM 9.4 8.5*   PT/INR Recent Labs    11/14/23 1603  LABPROT 14.9  INR 1.1   CMP     Component Value Date/Time   NA 141 11/15/2023 0530   NA 144 12/05/2022 1432   K 3.8 11/15/2023 0530   CL 109 11/15/2023 0530   CO2 25 11/15/2023 0530   GLUCOSE 87 11/15/2023 0530   BUN 18 11/15/2023 0530   BUN 22 12/05/2022 1432   CREATININE 0.92 11/15/2023 0530   CREATININE 1.05 (H)  03/06/2023 1321   CALCIUM 8.5 (L) 11/15/2023 0530   PROT 5.6 (L) 11/15/2023 0530   ALBUMIN  3.3 (L) 11/15/2023 0530   AST 24 11/15/2023 0530   AST 28 02/04/2021 1428   ALT 17 11/15/2023 0530   ALT 25 02/04/2021 1428   ALKPHOS 69 11/15/2023 0530   BILITOT 0.5 11/15/2023 0530   BILITOT 0.4 02/04/2021 1428   GFRNONAA >60 11/15/2023 0530   GFRNONAA 58 (L) 03/06/2023 1321   GFRAA >60 02/27/2017 0619   Lipase     Component Value Date/Time   LIPASE 31 02/25/2017 1736       Studies/Results: CT ELBOW LEFT WO CONTRAST Result Date: 11/14/2023 EXAM: CT LEFT ELBOW, WITHOUT IV CONTRAST TECHNIQUE: Axial images were acquired through the left elbow without IV contrast. Reformatted images were reviewed. Automated exposure control, iterative reconstruction, and/or weight based adjustment of the mA/kV was utilized to reduce the radiation dose to as low as reasonably achievable. COMPARISON: Colon radiographs 11/13/2023. CLINICAL HISTORY: Elbow trauma. FINDINGS: BONES AND JOINTS: Small elbow joint effusion observed. No appreciable fracture or acute bony findings. No dislocation. The joint spaces are normal. SOFT TISSUES: Subcutaneous edema superficial to the long head  of the triceps and surrounds the ulnar nerve at the level of the distal humerus correlate with any ulnar neuropathy. SKULL AND CERVICAL SPINE: Multilevel spinal fixation and an AICD lead are demonstrated. IMPRESSION: 1. Small elbow joint effusion. 2. No acute osseous abnormality. 3. Subcutaneous edema superficial to the long head of the triceps and surrounding the ulnar nerve at the level of the distal humerus; consider evaluation for ulnar neuropathy. Electronically signed by: Ryan Salvage MD 11/14/2023 02:13 PM EST RP Workstation: [id number]   CT Head Wo Contrast Result Date: 11/14/2023 EXAM: CT HEAD WITHOUT CONTRAST 11/14/2023 10:07:00 AM TECHNIQUE: CT of the head was performed without the administration of intravenous contrast. Automated  exposure control, iterative reconstruction, and/or weight based adjustment of the mA/kV was utilized to reduce the radiation dose to as low as reasonably achievable. COMPARISON: Head CT yesterday. CT 06/24/2013. CLINICAL HISTORY: 68 year old female. Head trauma, moderate-severe. FINDINGS: BRAIN AND VENTRICLES: No acute hemorrhage. No evidence of acute infarct. No hydrocephalus. No extra-axial collection. No mass effect or midline shift. Brain volume is stable and normal for age. Gray white differentiation stable and normal for age. No suspicious intracranial vascular hyperdensity. ORBITS: No acute abnormality. SINUSES: Chronic bilateral sphenoid sinus disease, chronic right posterior ethmoid sinus disease. Chronic mucoperiosteal thickening on the right. Evolution of left sphenoid sinus changes since 2015 also most compatible with chronic mucoperiosteal thickening. Bubbly opacity also on that side. No complicating features identified. Other paranasal sinuses, middle ears and mastoids remain well aerated. SOFT TISSUES AND SKULL: No acute soft tissue abnormality. No skull fracture. IMPRESSION: 1. Normal for age non-contrast CT appearance of the brain. 2. Advanced sphenoid sinus disease with acute on chronic features, no complication. Electronically signed by: Helayne Hurst MD 11/14/2023 10:14 AM EST RP Workstation: [id number]   CT T-SPINE NO CHARGE Result Date: 11/13/2023 EXAM: CT THORACIC SPINE WITHOUT CONTRAST 11/13/2023 10:37:38 PM TECHNIQUE: CT of the thoracic spine was performed without the administration of intravenous contrast. Multiplanar reformatted images are provided for review. Automated exposure control, iterative reconstruction, and/or weight based adjustment of the mA/kV was utilized to reduce the radiation dose to as low as reasonably achievable. COMPARISON: None available. CLINICAL HISTORY: FINDINGS: BONES AND ALIGNMENT: Thoracolumbar to sacrum and iliac surgical hardware. Right thoracolumbar  scoliosis. No CT finding to suggest surgical hardware acute complication. Diffusely decreased bone density. Chronic L2 compression fracture stable. No acute fracture or suspicious bone lesion. DEGENERATIVE CHANGES: No significant degenerative changes. SOFT TISSUES: No acute abnormality. IMPRESSION: 1. Postoperative thoracolumbar to sacrum and iliac hardware without CT evidence of acute complication. No acute abnormality of the thoracolumbar spine. 2. Diffusely decreased bone density. Electronically signed by: Morgane Naveau MD 11/13/2023 11:15 PM EST RP Workstation: [id number]   CT L-SPINE NO CHARGE Result Date: 11/13/2023 EXAM: CT OF THE LUMBAR SPINE WITHOUT CONTRAST 11/13/2023 10:37:38 PM TECHNIQUE: CT of the lumbar spine was performed without the administration of intravenous contrast. Multiplanar reformatted images are provided for review. Automated exposure control, iterative reconstruction, and/or weight based adjustment of the mA/kV was utilized to reduce the radiation dose to as low as reasonably achievable. COMPARISON:N None available. CLINICAL HISTORY: FINDINGS: BONES AND ALIGNMENT: Thoracolumbar to sacrum and iliac surgical hardware. Right thoracolumbar scoliosis. Diffusely decreased bone density. Chronic L2 compression fracture stable. No CT finding to suggest surgical hardware acute complication. Normal vertebral body heights (except L2). No acute fracture or suspicious bone lesion. DEGENERATIVE CHANGES: No significant degenerative changes. SOFT TISSUES: No acute abnormality. IMPRESSION: 1. Thoracolumbar to sacrum and iliac surgical  hardware without CT evidence of acute complication. Electronically signed by: Morgane Naveau MD 11/13/2023 11:14 PM EST RP Workstation: [id number]   CT CHEST ABDOMEN PELVIS W CONTRAST Result Date: 11/13/2023 EXAM: CT CHEST, ABDOMEN AND PELVIS WITH CONTRAST 11/13/2023 10:37:38 PM TECHNIQUE: CT of the chest, abdomen and pelvis was performed with the administration of  iohexol  (OMNIPAQUE ) 300 MG/ML solution. Multiplanar reformatted images are provided for review. Automated exposure control, iterative reconstruction, and/or weight based adjustment of the mA/kV was utilized to reduce the radiation dose to as low as reasonably achievable. COMPARISON: None available. CLINICAL HISTORY: Polytrauma, blunt. FINDINGS: CHEST: MEDIASTINUM AND LYMPH NODES: Heart and pericardium are unremarkable. The central airways are clear. No mediastinal, hilar or axillary lymphadenopathy. No mediastinal hematoma. No pneumomediastinum. LUNGS AND PLEURA: No focal consolidation or pulmonary edema. No pleural effusion or pneumothorax. ABDOMEN AND PELVIS: LIVER: The liver is unremarkable. GALLBLADDER AND BILE DUCTS: Status post cholecystectomy. No biliary ductal dilatation. SPLEEN: No acute abnormality. PANCREAS: No acute abnormality. ADRENAL GLANDS: No acute abnormality. KIDNEYS, URETERS AND BLADDER: No stones in the kidneys or ureters. No hydronephrosis. No perinephric or periureteral stranding. Urinary bladder is unremarkable. GI AND BOWEL: Stomach demonstrates no acute abnormality. There is no bowel obstruction. The appendix is unremarkable. REPRODUCTIVE ORGANS: Status post hysterectomy. No adnexal mass. PERITONEUM AND RETROPERITONEUM: No ascites. No free air. No mesenteric hematoma. VASCULATURE: Aorta is normal in caliber. ABDOMINAL AND PELVIS LYMPH NODES: No lymphadenopathy. BONES AND SOFT TISSUES: Left chest wall dual cardiac pacemaker and defibrillator. Please see separately dictated CT thoracolumbar spine. No acute bilateral hip or pelvic abnormalities fracture. No acute displaced rib fracture. No acute displaced sternal fracture. No sacral fracture. 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 Right medial soft tissue acute hematoma measuring up to 13 x 6 cm. IMPRESSION: 1. Right medial soft tissue acute hematoma measuring up to 13 x 6 cm. No mesenteric or  mediastinal hematoma. 2. No acute intrathoracic, intraabdominal, or intrapelvic abnormality. Electronically signed by: Morgane Naveau MD 11/13/2023 11:12 PM EST RP Workstation: [id number]   CT Head Wo Contrast Result Date: 11/13/2023 EXAM: CT HEAD AND CERVICAL SPINE 11/13/2023 10:37:38 PM TECHNIQUE: CT of the head and cervical spine was performed without the administration of intravenous contrast. Multiplanar reformatted images are provided for review. Automated exposure control, iterative reconstruction, and/or weight based adjustment of the mA/kV was utilized to reduce the radiation dose to as low as reasonably achievable. COMPARISON: CT cervical spine 09/23/2022. CT head 06/24/2013 CLINICAL HISTORY: Head trauma, moderate-severe FINDINGS: CT HEAD BRAIN AND VENTRICLES: No acute intracranial hemorrhage. No mass effect or midline shift. No abnormal extra-axial fluid collection. No evidence of acute infarct. No hydrocephalus. ORBITS: No acute abnormality. SINUSES AND MASTOIDS: Chronically opacified and expanded right posterior ethmoid air cells and sphenoid sinus. No mastoid effusions. SOFT TISSUES AND SKULL: No acute skull fracture. No acute soft tissue abnormality. CT CERVICAL SPINE BONES AND ALIGNMENT: No acute fracture or traumatic malalignment. Unchanged dextrocurvature. DEGENERATIVE CHANGES: Multilevel degenerative change including likely moderate foraminal stenosis bilaterally at C5-C6 and C6-C7. SOFT TISSUES: No prevertebral soft tissue swelling. IMPRESSION: 1. No acute intracranial abnormality. 2. No acute fracture or traumatic malalignment of the cervical spine. 3. Chronically opacified and expanded right posterior ethmoid air cells and sphenoid sinus, suggestive of mucocele. Electronically signed by: Gilmore Molt MD 11/13/2023 11:04 PM EST RP Workstation: [id number]   CT Cervical Spine Wo Contrast Result Date: 11/13/2023 EXAM: CT HEAD AND CERVICAL SPINE 11/13/2023 10:37:38 PM TECHNIQUE: CT of  the  head and cervical spine was performed without the administration of intravenous contrast. Multiplanar reformatted images are provided for review. Automated exposure control, iterative reconstruction, and/or weight based adjustment of the mA/kV was utilized to reduce the radiation dose to as low as reasonably achievable. COMPARISON: CT cervical spine 09/23/2022. CT head 06/24/2013 CLINICAL HISTORY: Head trauma, moderate-severe FINDINGS: CT HEAD BRAIN AND VENTRICLES: No acute intracranial hemorrhage. No mass effect or midline shift. No abnormal extra-axial fluid collection. No evidence of acute infarct. No hydrocephalus. ORBITS: No acute abnormality. SINUSES AND MASTOIDS: Chronically opacified and expanded right posterior ethmoid air cells and sphenoid sinus. No mastoid effusions. SOFT TISSUES AND SKULL: No acute skull fracture. No acute soft tissue abnormality. CT CERVICAL SPINE BONES AND ALIGNMENT: No acute fracture or traumatic malalignment. Unchanged dextrocurvature. DEGENERATIVE CHANGES: Multilevel degenerative change including likely moderate foraminal stenosis bilaterally at C5-C6 and C6-C7. SOFT TISSUES: No prevertebral soft tissue swelling. IMPRESSION: 1. No acute intracranial abnormality. 2. No acute fracture or traumatic malalignment of the cervical spine. 3. Chronically opacified and expanded right posterior ethmoid air cells and sphenoid sinus, suggestive of mucocele. Electronically signed by: Gilmore Molt MD 11/13/2023 11:04 PM EST RP Workstation: [id number]   DG Elbow Complete Left Result Date: 11/13/2023 EXAM: 3 VIEW(S) XRAY OF THE LEFT ELBOW COMPARISON: None available. CLINICAL HISTORY: fall, pain FINDINGS: BONES AND JOINTS: Left elbow joint effusion noted. No visible fracture but question of an occult fracture given the effusion. No joint dislocation. SOFT TISSUES: The soft tissues are unremarkable. IMPRESSION: 1. Left elbow joint effusion concerning for an occult fracture. 2. No visible  fracture identified. Consider immobilization and repeat imaging in 1 week if symptoms persist. Electronically signed by: Franky Crease MD 11/13/2023 10:34 PM EST RP Workstation: [id number]    Anti-infectives: Anti-infectives (From admission, onward)    None         Latest Ref Rng & Units 11/15/2023    5:30 AM 11/14/2023    9:19 PM 11/14/2023   12:12 PM  CBC  WBC 4.0 - 10.5 K/uL 6.6     Hemoglobin 12.0 - 15.0 g/dL 89.9  89.6  8.6   Hematocrit 36.0 - 46.0 % 31.0  31.8  26.9   Platelets 150 - 400 K/uL 156        Assessment/Plan  68 y/o F s/p ground level fall Right lower back/flank hematoma - hold Eliquis , continue abdominal binder, trend hgb, transfuse PRN;  ABL anemia - due to above; hgb stable today at 10 (see above trend) Left elbow joint effusion - CT negative for fracture; reviewed with ortho PA-C Ozell Ned, sling for comfort, WBAT   FEN - ok for a solid diet from a trauma persepctive VTE - SCD's, hold chemical VTE ppx ID - none indicated Admit - to TRH service for MMP ; PT/OT   Afib on Eliquis  -trend hemoglobin, hopefully can resume Eliquis  in the next 1 to 2 days Hx splenic infarction HTN CHF LBBB History of biventricular pacemaker Essential tremor Generalized anxiety disorder Post-surgical hypothyroidism  Migraine HA  Osteoporosis Back pain   LOS: 0 days   I reviewed nursing notes, hospitalist notes, last 24 h vitals and pain scores, last 48 h intake and output, last 24 h labs and trends, and last 24 h imaging results.  This care required moderate level of medical decision making.   Almarie Pringle, PA-C [facility] Please see Amion for pager number during day hours 7:00am-4:30pm

## 2023-11-15 NOTE — Plan of Care (Signed)
   Problem: Activity: Goal: Risk for activity intolerance will decrease Outcome: Progressing   Problem: Coping: Goal: Level of anxiety will decrease Outcome: Progressing   Problem: Safety: Goal: Ability to remain free from injury will improve Outcome: Progressing   Problem: Skin Integrity: Goal: Risk for impaired skin integrity will decrease Outcome: Progressing

## 2023-11-15 NOTE — Care Management Obs Status (Signed)
 MEDICARE OBSERVATION STATUS NOTIFICATION   Patient Details  Name: [name] MRN: [id number] Date of Birth: Mar 24, 1955   Medicare Observation Status Notification Given:  Yes    Duwaine GORMAN [name], LCSW 11/15/2023, 2:12 PM

## 2023-11-15 NOTE — Evaluation (Signed)
 Physical Therapy Evaluation Patient Details Name: ARIANNE KLINGE MRN: [id number] DOB: [birth year] Today's Date: 11/15/2023  History of Present Illness  Mrs. Kihn is a 68 yr old female brought to the hospital after a fall on 11/13/23. She was found to have a L elbow contusion and R flank hematoma. 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 At baseline, pt is independent and ambulatory without AD.  She has home support and necessary DME.  Pt does demonstrate mild deficits in balance and has had 2 falls this year.  Pt able to ambulate 300' without AD.  DGI score of 16/24 indicating higher fall risk.  She does report having decreased balance baseline but potentially slightly worse now.  Will benefit from acute PT services.  Also could benefit from outpt PT at d/c to continue balance and strength training.   Pt currently with functional limitations due to the deficits listed below (see PT Problem List). Pt will benefit from acute skilled PT to increase their independence and safety with mobility to allow discharge.           If plan is discharge home, recommend the following: Help with stairs or ramp for entrance;Assistance with cooking/housework   Can travel by private vehicle        Equipment Recommendations None recommended by PT  Recommendations for Other Services       Functional Status Assessment Patient has had a recent decline in their functional status and demonstrates the ability to make significant improvements in function in a reasonable and predictable amount of time.     Precautions / Restrictions Precautions Precautions:  Fall Precaution/Restrictions Comments: abdominal binder, LUE sling for comfort      Mobility  Bed Mobility Overal bed mobility: Needs Assistance Bed Mobility: Supine to Sit, Sit to Supine     Supine to sit: Modified independent (Device/Increase time) Sit to supine: Supervision        Transfers Overall transfer level: Needs assistance Equipment used: None Transfers: Sit to/from Stand Sit to Stand: Supervision                Ambulation/Gait Ambulation/Gait assistance: Supervision, Contact guard assist Gait Distance (Feet): 300 Feet Assistive device: None Gait Pattern/deviations: Step-through pattern, Decreased stride length Gait velocity: decrease but functional     General Gait Details: CGA progressed to supervision , CGA with challenges, noting decreased trunk rotation and arm swing  Stairs Stairs: Yes Stairs assistance: Contact guard assist Stair Management: Two rails, Alternating pattern, Forwards Number of Stairs: 3    Wheelchair Mobility     Tilt Bed    Modified Rankin (Stroke Patients Only)       Balance Overall balance assessment: Needs assistance Sitting-balance support: No upper extremity supported Sitting balance-Leahy Scale: Good     Standing balance support: No upper extremity supported Standing balance-Leahy Scale: Good Standing balance comment: able to ambulate without AD but does have deficits with challenges and hx of 2 falls                 Standardized Balance Assessment Standardized Balance Assessment : Dynamic Gait Index   Dynamic Gait Index Level Surface: Mild Impairment Change in Gait Speed: Mild Impairment Gait with Horizontal Head  Turns: Mild Impairment Gait with Vertical Head Turns: Mild Impairment Gait and Pivot Turn: Mild Impairment Step Over Obstacle: Mild Impairment Step Around Obstacles: Mild Impairment Steps: Mild Impairment Total Score: 16       Pertinent Vitals/Pain Pain Assessment Pain  Assessment: No/denies pain    Home Living Family/patient expects to be discharged to:: Private residence Living Arrangements: Spouse/significant other Available Help at Discharge: Family Type of Home: House Home Access: Stairs to enter Entrance Stairs-Rails: Left;Right;Can reach both Secretary/administrator of Steps: 3   Home Layout: One level Home Equipment: Shower seat - built [employer] (2 wheels);Cane - single point;Grab bars - tub/shower;Grab bars - toilet      Prior Function Prior Level of Function : Independent/Modified Independent;Driving             Mobility Comments: Pt could ambulate in community without AD, does report hx of poor balance with 2 falls this year ADLs Comments:  (Independent with ADLs, cooking and driving. They have hired assist 2x per month for cleaning.)     Extremity/Trunk Assessment   Upper Extremity Assessment Upper Extremity Assessment: Defer to OT evaluation    Lower Extremity Assessment Lower Extremity Assessment: LLE deficits/detail;RLE deficits/detail RLE Deficits / Details: ROM WFL; MMT: ankle 5/5, knee ext 5/5, hip 4/5 LLE Deficits / Details: ROM WFL; MMT: ankle 5/5, knee ext 5/5, hip 4/5    Cervical / Trunk Assessment Cervical / Trunk Assessment: Back Surgery;Other exceptions Cervical / Trunk Exceptions: hx of scolosis and multiple back surgeries/fusions, flat back  Communication   Communication Communication: No apparent difficulties    Cognition Arousal: Alert Behavior During Therapy: WFL for tasks assessed/performed   PT - Cognitive impairments: No apparent impairments                                 Cueing       General Comments      Exercises     Assessment/Plan    PT Assessment Patient needs continued PT services  PT Problem List Decreased mobility;Decreased range of motion;Decreased balance;Decreased strength;Decreased knowledge of use of DME       PT Treatment Interventions DME  instruction;Therapeutic exercise;Gait training;Stair training;Functional mobility training;Therapeutic activities;Patient/family education;Neuromuscular re-education;Balance training    PT Goals (Current goals can be found in the Care Plan section)  Acute Rehab PT Goals Patient Stated Goal: return home PT Goal Formulation: With patient Time For Goal Achievement: 11/29/23 Potential to Achieve Goals: Good Additional Goals Additional Goal #1: Pt will score >19 on DGI for decreased fall risk    Frequency Min 1X/week     Co-evaluation               AM-PAC PT 6 Clicks Mobility  Outcome Measure Help needed turning from your back to your side while in a flat bed without using bedrails?: None Help needed moving from lying on your back to sitting on the side of a flat bed without using bedrails?: None Help needed moving to and from a bed to a chair (including a wheelchair)?: None Help needed standing up from a chair using your arms (e.g., wheelchair or bedside chair)?: None Help needed to walk in hospital room?: A Little Help needed climbing 3-5 steps with a railing? : A Little 6 Click Score: 22    End of Session Equipment Utilized During Treatment: Gait belt Activity Tolerance: Patient tolerated treatment well Patient left: in bed;with call bell/phone within reach;with  bed alarm set Nurse Communication: Mobility status PT Visit Diagnosis: Other abnormalities of gait and mobility (R26.89);Muscle weakness (generalized) (M62.81)    Time: 8277-8251 PT Time Calculation (min) (ACUTE ONLY): 26 min   Charges:   PT Evaluation $PT Eval Low Complexity: 1 Low PT Treatments $Gait Training: 8-22 mins PT General Charges $$ ACUTE PT VISIT: 1 Visit         Benjiman, PT Acute Rehab [facility] [phone]   Benjiman VEAR [place] 11/15/2023, 6:03 PM

## 2023-11-15 NOTE — Hospital Course (Signed)
 I'm thinking if this lady clears PT and if her hgb is stable in the morning she can probably go home tomorrow? and resume her DOAC tomorrow

## 2023-11-15 NOTE — Progress Notes (Signed)
  Progress Note   Patient: [name] FMW:[id number] DOB: June 26, 1955 DOA: 11/13/2023     0 DOS: the patient was seen and examined on 11/15/2023   Brief hospital course:   Assessment and Plan: Right-sided abdominal soft tissue hematoma Acute blood loss anemia Anticoagulation held.  Continue abdominal binder. Monitor hemoglobin, if stable tomorrow, can likely resume DOAC as per surgery  Left elbow contusion CT no fracture.  Arm sling, analgesia  PAF Stable.  DOAC on hold. Resume propranolol   Ductal carcinoma in situ (DCIS) of left breast Continue anastrozole    Post-surgical hypothyroidism Continue levothyroxine     Subjective:  Feels better today Headache is gone  Physical Exam: Vitals:   11/14/23 1937 11/15/23 0158 11/15/23 0508 11/15/23 1339  BP: (!) 102/51 105/67 92/61 (!) 121/49  Pulse: (!) 103 78 80 75  Resp: 15 17 16 16   Temp: 98.2 F (36.8 C) 97.9 F (36.6 C) 98 F (36.7 C) 98 F (36.7 C)  TempSrc:    Oral  SpO2: 100% 97% 99% 99%  Weight:      Height:       Physical Exam Vitals reviewed.  Constitutional:      General: She is not in acute distress.    Appearance: She is not ill-appearing or toxic-appearing.  Cardiovascular:     Rate and Rhythm: Normal rate and regular rhythm.     Heart sounds: No murmur heard. Pulmonary:     Effort: Pulmonary effort is normal. No respiratory distress.     Breath sounds: No wheezing, rhonchi or rales.  Neurological:     Mental Status: She is alert.  Psychiatric:        Mood and Affect: Mood normal.        Behavior: Behavior normal.     Data Reviewed: Complete metabolic panel unremarkable He hemoglobin stable at 10.0  Family Communication: none  Disposition: Status is: Observation      Time spent: 20 minutes  Author: Toribio Door, MD 11/15/2023 7:11 PM  For on call review [email or web address].

## 2023-11-15 NOTE — Evaluation (Signed)
 Occupational Therapy Evaluation Patient Details Name: [name] MRN: [id number] DOB: 1955/01/12 Today's Date: 11/15/2023   History of Present Illness   [name] is a 68 yr old female brought to the hospital after a fall. She was found to have a L elbow contusion and R flank hematoma. 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 She reported generalized soreness and 3/10 LUE and back pain. She was educated on donning abdominal binder and LUE sling for comfort. All needed OT education and/or intervention was provided during the session and she does not require further OT services. OT will sign off and recommend she return home with her spouse at discharge.      If plan is discharge home, recommend the following:   Assistance with cooking/housework;Help with stairs or ramp for entrance     Functional Status Assessment   Patient has not had a recent decline in their functional status     Equipment Recommendations   None recommended by OT     Recommendations for Other Services         Precautions/Restrictions   Precautions Precautions: Fall Precaution/Restrictions Comments: abdominal binder, LUE sling for comfort     Mobility Bed Mobility Overal bed mobility: Modified Independent Bed Mobility: Supine to Sit     Supine to sit: Modified independent (Device/Increase time)          Transfers Overall transfer level: Needs assistance   Transfers: Sit to/from Stand Sit to Stand: Supervision                  Balance     Sitting  balance-Leahy Scale: Good       Standing balance-Leahy Scale: Fair         ADL either performed or assessed with clinical judgement   ADL Overall ADL's : Needs assistance/impaired Eating/Feeding: Independent;Sitting   Grooming: Set up;Standing Grooming Details (indicate cue type and reason): She performed hand washing in standing at sink level. Upper Body Bathing: Set up;Sitting   Lower Body Bathing: Set up;Sit to/from stand;Sitting/lateral leans   Upper Body Dressing : Set up;Sitting   Lower Body Dressing: Set up;Sitting/lateral leans Lower Body Dressing Details (indicate cue type and reason): She doffed then donned her socks seated EOB. Toilet Transfer: Set up;Supervision/safety;Ambulation Toilet Transfer Details (indicate cue type and reason): She ambulated to and from the bathroom in his room without an assistive device. Toileting- Clothing Manipulation and Hygiene: Set up;Sit to/from stand Toileting - Clothing Manipulation Details (indicate cue type and reason): She performed toileting management at bathroom level.             Vision   Additional Comments: She correctly read the time depicted on the wall clock.            Pertinent Vitals/Pain Pain Assessment Pain Assessment: 0-10 Pain Score: 3  Pain Location: LUE and back Pain Intervention(s): Monitored during session, Repositioned, Limited activity within patient's tolerance     Extremity/Trunk Assessment Upper Extremity Assessment Upper Extremity Assessment: Overall WFL for tasks assessed;Right hand dominant   Lower Extremity Assessment Lower Extremity Assessment: Overall WFL for tasks assessed  Communication Communication Communication: No apparent difficulties   Cognition Arousal: Alert Behavior During Therapy: WFL for tasks assessed/performed               OT - Cognition Comments: Oriented x4          Following commands: Intact       Cueing  General Comments   Cueing  Techniques: Verbal cues              Home Living Family/patient expects to be discharged to:: Private residence Living Arrangements: Spouse/significant other Available Help at Discharge: Family Type of Home: House Home Access: Stairs to enter Secretary/administrator of Steps: 3 Entrance Stairs-Rails: Left;Right Home Layout: One level     Bathroom Shower/Tub: Producer, Television/film/video: Handicapped height     Home Equipment: Shower seat - built [employer]raiser (2 wheels);Cane - single point          Prior Functioning/Environment Prior Level of Function : Independent/Modified Independent;Driving             Mobility Comments:  (Independent with ambulation.) ADLs Comments:  (Independent with ADLs, cooking and driving. They have hired assist 2x per month for cleaning.)    OT Problem List: Pain   OT Treatment/Interventions:   No further treatment needs identified      OT Goals(Current goals can be found in the care plan section)   Acute Rehab OT Goals OT Goal Formulation: All assessment and education complete, DC therapy   OT Frequency:   N/A       AM-PAC OT 6 Clicks Daily Activity     Outcome Measure Help from another person eating meals?: None Help from another person taking care of personal grooming?: None Help from another person toileting, which includes using toliet, bedpan, or urinal?: None Help from another person bathing (including washing, rinsing, drying)?: A Little Help from another person to put on and taking off regular upper body clothing?: None Help from another person to put on and taking off regular lower body clothing?: None 6 Click Score: 23   End of Session Equipment Utilized During Treatment: Other (comment) (abdominal binder and LUE sling) Nurse Communication: Mobility status  Activity Tolerance: Patient tolerated treatment well Patient left: in chair;with call bell/phone within reach;with family/visitor present  OT  Visit Diagnosis: Pain Pain - part of body:  (LUE and back)                Time: 8451-8388 OT Time Calculation (min): 23 min Charges:  OT General Charges $OT Visit: 1 Visit OT Evaluation $OT Eval Low Complexity: 1 Low OT Treatments $Therapeutic Activity: 8-22 mins    [name], OTR/L 11/15/2023, 5:48 PM

## 2023-11-15 NOTE — TOC Initial Note (Signed)
 Transition of Care [facility]) - Initial/Assessment Note   Patient Details  Name: [name] MRN: [id number] Date of Birth: Mar 12, 1955  [facility]) CM/SW Contact:    Duwaine GORMAN [name], LCSW Phone Number: 11/15/2023, 9:02 AM  Clinical Narrative: Patient is from home with spouse. PT/OT consulted. Care management awaiting recommendations.  Expected Discharge Plan:  (TBD) Barriers to Discharge: Continued Medical Work up  Expected Discharge Plan and Services In-house Referral: Clinical Social Work Living arrangements for the past 2 months: Single Family Home  Prior Living Arrangements/Services Living arrangements for the past 2 months: Single Family Home Lives with:: Spouse Patient language and need for interpreter reviewed:: Yes Do you feel safe going back to the place where you live?: Yes      Need for Family Participation in Patient Care: No (Comment) Care giver support system in place?: Yes (comment) Criminal Activity/Legal Involvement Pertinent to Current Situation/Hospitalization: No - Comment as needed  Activities of Daily Living ADL Screening (condition at time of admission) Independently performs ADLs?: Yes (appropriate for developmental age) Is the patient deaf or have difficulty hearing?: No Does the patient have difficulty seeing, even when wearing glasses/contacts?: No Does the patient have difficulty concentrating, remembering, or making decisions?: No  Emotional Assessment Alcohol  / Substance Use: Not Applicable Psych Involvement: No (comment)  Admission diagnosis:  Cervicalgia [M54.2] Hematoma [T14.8XXA] Hematoma of right flank, initial encounter [S30.13XA] Patient Active Problem List   Diagnosis Date Noted   Hematoma 11/14/2023   ABLA (acute blood loss anemia) 11/14/2023   Left elbow contusion 11/14/2023   Benign essential tremor 07/10/2023   Generalized anxiety disorder 07/10/2023   Intractable chronic migraine without aura 07/10/2023    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   Restless legs syndrome 07/10/2023   Age-related osteoporosis without current pathological fracture 11/29/2020   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   Biventricular cardiac pacemaker in situ 10/08/2018   Long term current use of anticoagulant therapy 10/23/2017   AICD (automatic cardioverter/defibrillator) present    Aortic regurgitation    Nonischemic cardiomyopathy (HCC) 01/21/2011   Idiopathic peripheral neuropathy 08/24/2010   Paroxysmal atrial fibrillation (HCC) 02/23/2010   Raynaud's phenomenon 02/15/2007   PCP:  Jerrell Cleatus Ned, MD Pharmacy:   [facility] [phone] - [place],  - [address] [place] [address] Phone: [phone] Fax: [phone]  [facility] [address] [place] [address] Phone: [phone] Fax: [phone]  Social Drivers of Health (SDOH) Social History: SDOH Screenings   Food Insecurity: No Food Insecurity (11/14/2023)  Housing: Unknown (11/14/2023)  Transportation Needs: No Transportation Needs (11/14/2023)  Utilities: Not At Risk (11/14/2023)  Alcohol  Screen: Low Risk  (09/20/2022)  Depression (PHQ2-9): Low Risk  (11/13/2023)  Financial Resource Strain: Low Risk  (01/25/2023)  Physical Activity: Sufficiently Active (11/13/2023)  Social Connections: Socially Integrated (11/14/2023)  Stress: No Stress Concern Present (11/13/2023)  Tobacco Use: Low Risk  (11/13/2023)  Health Literacy: Adequate Health Literacy (11/13/2023)   SDOH Interventions:    Readmission Risk Interventions     No data to display

## 2023-11-16 ENCOUNTER — Other Ambulatory Visit (HOSPITAL_COMMUNITY): Payer: Self-pay

## 2023-11-16 DIAGNOSIS — Z7901 Long term (current) use of anticoagulants: Secondary | ICD-10-CM | POA: Diagnosis not present

## 2023-11-16 DIAGNOSIS — D62 Acute posthemorrhagic anemia: Secondary | ICD-10-CM | POA: Diagnosis not present

## 2023-11-16 DIAGNOSIS — I480 Paroxysmal atrial fibrillation: Secondary | ICD-10-CM | POA: Diagnosis not present

## 2023-11-16 DIAGNOSIS — S5002XA Contusion of left elbow, initial encounter: Secondary | ICD-10-CM | POA: Diagnosis not present

## 2023-11-16 DIAGNOSIS — T148XXA Other injury of unspecified body region, initial encounter: Secondary | ICD-10-CM | POA: Diagnosis not present

## 2023-11-16 DIAGNOSIS — S300XXA Contusion of lower back and pelvis, initial encounter: Secondary | ICD-10-CM | POA: Diagnosis not present

## 2023-11-16 LAB — CBC
HCT: 29.2 % — ABNORMAL LOW (ref 36.0–46.0)
Hemoglobin: 9.5 g/dL — ABNORMAL LOW (ref 12.0–15.0)
MCH: 30.1 pg (ref 26.0–34.0)
MCHC: 32.5 g/dL (ref 30.0–36.0)
MCV: 92.4 fL (ref 80.0–100.0)
Platelets: 226 K/uL (ref 150–400)
RBC: 3.16 MIL/uL — ABNORMAL LOW (ref 3.87–5.11)
RDW: 12.7 % (ref 11.5–15.5)
WBC: 7.5 K/uL (ref 4.0–10.5)
nRBC: 0 % (ref 0.0–0.2)

## 2023-11-16 MED ORDER — METHOCARBAMOL 500 MG PO TABS
500.0000 mg | ORAL_TABLET | Freq: Three times a day (TID) | ORAL | 0 refills | Status: AC | PRN
  Filled 2023-11-16: qty 12, 4d supply, fill #0

## 2023-11-16 MED ORDER — HYDROCODONE-ACETAMINOPHEN 5-325 MG PO TABS
1.0000 | ORAL_TABLET | Freq: Once | ORAL | Status: DC
  Filled 2023-11-16: qty 1

## 2023-11-16 NOTE — Progress Notes (Signed)
 Discharge medication delivered to patient at bedside in a secure bag.

## 2023-11-16 NOTE — TOC Transition Note (Signed)
 Transition of Care [facility]) - Discharge Note  Patient Details  Name: [name] MRN: [id number] Date of Birth: October 01, 1955  [facility]) CM/SW Contact:  Duwaine GORMAN [name], LCSW Phone Number: 11/16/2023, 9:46 AM  Clinical Narrative: PT evaluation recommended OPPT. Patient agreeable to CSW making OPPT referral. Patient confirmed she does not need any DME as she has a rolling walker and cane at home. CSW made OPPT referral. Care management signing off.  Final next level of care: OP Rehab Barriers to Discharge: Barriers Resolved  Patient Goals and CMS Choice Patient states their goals for this hospitalization and ongoing recovery are:: OPPT Choice offered to / list presented to : NA  Discharge Plan and Services Additional resources added to the After Visit Summary for   In-house Referral: Clinical Social Work       DME Arranged: N/A DME Agency: NA  Social Drivers of Health (SDOH) Interventions SDOH Screenings   Food Insecurity: No Food Insecurity (11/14/2023)  Housing: Unknown (11/14/2023)  Transportation Needs: No Transportation Needs (11/14/2023)  Utilities: Not At Risk (11/14/2023)  Alcohol  Screen: Low Risk  (09/20/2022)  Depression (PHQ2-9): Low Risk  (11/13/2023)  Financial Resource Strain: Low Risk  (01/25/2023)  Physical Activity: Sufficiently Active (11/13/2023)  Social Connections: Socially Integrated (11/14/2023)  Stress: No Stress Concern Present (11/13/2023)  Tobacco Use: Low Risk  (11/13/2023)  Health Literacy: Adequate Health Literacy (11/13/2023)   Readmission Risk Interventions     No data to display

## 2023-11-16 NOTE — Discharge Summary (Signed)
 Physician Discharge Summary   Patient: [name] MRN: [id number] DOB: [birth year]  Admit date:     11/13/2023  Discharge date: 11/16/23  Discharge Physician: Toribio Door   PCP: Jerrell Cleatus Ned, MD   Recommendations at discharge:  See discussion below, follow-up routine care, follow-up hematoma  Discharge Diagnoses: Principal Problem:   Hematoma Active Problems:   Paroxysmal atrial fibrillation (HCC)   Ductal carcinoma in situ (DCIS) of left breast   Benign essential tremor   Post-surgical hypothyroidism   ABLA (acute blood loss anemia)   Left elbow contusion  Resolved Problems:   * No resolved hospital problems. *  Hospital Course: 68 year old woman PMH including PAF anticoagulated on apixaban , presented after mechanical fall at home alternating and back, flank and some abdominal pain.  Workup was notable for large hematoma and low blood pressures.  General surgery recommended observation, abdominal binder.  Hemoglobin stabilized.  Hospitalization uncomplicated.  Discharged home in good condition.  Consultants General surgery  Procedures/Events 11/3 admission for hematoma, ABLA  Right-sided lower back/flank hematoma status post mechanical fall complicated by anticoagulation Acute blood loss anemia Anticoagulation held.  Continue abdominal binder. Hemoglobin hemoglobin stabilized.  Cleared by general surgery for discharge.   Left elbow contusion CT no fracture.  Arm sling, analgesia   PAF Stable.  Can resume DOAC 11/8. Continue propranolol    Ductal carcinoma in situ (DCIS) of left breast Continue anastrozole     Post-surgical hypothyroidism Continue levothyroxine   Discharge planning From: Home Return: Home  Disposition: Home Diet recommendation:  Regular diet DISCHARGE MEDICATION: Allergies as of 11/16/2023       Reactions   Other Nausea And Vomiting, Other (See Comments)   Most narcotic pain meds cause N/V and CHEST PAIN; needs an  antiemetic to tolerate Also, patient was diagnosed with Raynaud's disease and was told to NOT place IV(s) in either hand because of poor circulation   Iodine Other (See Comments)   Blisters   Tape Other (See Comments)   PATIENT'S SKIN IS VERY THIN; IT TEARS, BRUISES, AND BLEEDS VERY EASILY (PLEASE USE COBAN WRAP, IF POSSIBLE!!)   Codeine Other (See Comments)   Chest pain   Fentanyl  Other (See Comments)   Bad dreams   Morphine  And Codeine Nausea And Vomiting        Medication List     PAUSE taking these medications    Eliquis  5 MG Tabs tablet Wait to take this until: November 18, 2023 Generic drug: apixaban  TAKE 1 TABLET TWICE DAILY       STOP taking these medications    buPROPion  300 MG 24 hr tablet Commonly known as: WELLBUTRIN  XL   omeprazole  40 MG capsule Commonly known as: PRILOSEC       TAKE these medications    albuterol  108 (90 Base) MCG/ACT inhaler Commonly known as: VENTOLIN  HFA Inhale 2 puffs into the lungs every 6 (six) hours as needed for wheezing or shortness of breath.   anastrozole  1 MG tablet Commonly known as: ARIMIDEX  Take 1 tablet (1 mg total) by mouth daily.   eletriptan  40 MG tablet Commonly known as: RELPAX  Take 1 tablet (40 mg total) by mouth every 2 (two) hours as needed for migraine or headache. May repeat in 2 hours if headache persists or recurs.   gabapentin  600 MG tablet Commonly known as: NEURONTIN  Take 600 mg by mouth 2 (two) times daily.   levothyroxine  100 MCG tablet Commonly known as: SYNTHROID  Take 1 tablet (100 mcg total) by mouth daily before breakfast.  methocarbamol 500 MG tablet Commonly known as: ROBAXIN Take 1 tablet (500 mg total) by mouth every 8 (eight) hours as needed for muscle spasms.   propranolol  60 MG tablet Commonly known as: INDERAL  Take 1 tablet (60 mg total) by mouth daily.   spironolactone  25 MG tablet Commonly known as: ALDACTONE  Take 0.5 tablets (12.5 mg total) by mouth daily.    traZODone 50 MG tablet Commonly known as: DESYREL Take 0.5 tablets (25 mg total) by mouth at bedtime as needed for sleep.        Follow-up Information     [facility]. Call.   Why: As needed Contact information: [address] [place]  [address] [phone]        Jerrell Cleatus Ned, MD. Schedule an appointment as soon as possible for a visit in 1 week(s).   Specialty: Internal Medicine Contact information: [address] [place] [address] [phone]                Feels better  Discharge Exam: Filed Weights   11/14/23 1753  Weight: 73.5 kg   Physical Exam Vitals reviewed.  Constitutional:      General: She is not in acute distress.    Appearance: She is not ill-appearing or toxic-appearing.  Cardiovascular:     Rate and Rhythm: Normal rate and regular rhythm.     Heart sounds: No murmur heard. Pulmonary:     Effort: Pulmonary effort is normal. No respiratory distress.     Breath sounds: No wheezing, rhonchi or rales.  Neurological:     Mental Status: She is alert.  Psychiatric:        Mood and Affect: Mood normal.        Behavior: Behavior normal.      Condition at discharge: good  The results of significant diagnostics from this hospitalization (including imaging, microbiology, ancillary and laboratory) are listed below for reference.   Imaging Studies: CT ELBOW LEFT WO CONTRAST Result Date: 11/14/2023 EXAM: CT LEFT ELBOW, WITHOUT IV CONTRAST TECHNIQUE: Axial images were acquired through the left elbow without IV contrast. Reformatted images were reviewed. Automated exposure control, iterative reconstruction, and/or weight based adjustment of the mA/kV was utilized to reduce the radiation dose to as low as reasonably achievable. COMPARISON: Colon radiographs 11/13/2023. CLINICAL HISTORY: Elbow trauma. FINDINGS: BONES AND JOINTS: Small elbow joint effusion observed. No appreciable fracture  or acute bony findings. No dislocation. The joint spaces are normal. SOFT TISSUES: Subcutaneous edema superficial to the long head of the triceps and surrounds the ulnar nerve at the level of the distal humerus correlate with any ulnar neuropathy. SKULL AND CERVICAL SPINE: Multilevel spinal fixation and an AICD lead are demonstrated. IMPRESSION: 1. Small elbow joint effusion. 2. No acute osseous abnormality. 3. Subcutaneous edema superficial to the long head of the triceps and surrounding the ulnar nerve at the level of the distal humerus; consider evaluation for ulnar neuropathy. Electronically signed by: Ryan Salvage MD 11/14/2023 02:13 PM EST RP Workstation: [id number]   CT Head Wo Contrast Result Date: 11/14/2023 EXAM: CT HEAD WITHOUT CONTRAST 11/14/2023 10:07:00 AM TECHNIQUE: CT of the head was performed without the administration of intravenous contrast. Automated exposure control, iterative reconstruction, and/or weight based adjustment of the mA/kV was utilized to reduce the radiation dose to as low as reasonably achievable. COMPARISON: Head CT yesterday. CT 06/24/2013. CLINICAL HISTORY: 68 year old female. Head trauma, moderate-severe. FINDINGS: BRAIN AND VENTRICLES: No acute hemorrhage. No evidence of  acute infarct. No hydrocephalus. No extra-axial collection. No mass effect or midline shift. Brain volume is stable and normal for age. Gray white differentiation stable and normal for age. No suspicious intracranial vascular hyperdensity. ORBITS: No acute abnormality. SINUSES: Chronic bilateral sphenoid sinus disease, chronic right posterior ethmoid sinus disease. Chronic mucoperiosteal thickening on the right. Evolution of left sphenoid sinus changes since 2015 also most compatible with chronic mucoperiosteal thickening. Bubbly opacity also on that side. No complicating features identified. Other paranasal sinuses, middle ears and mastoids remain well aerated. SOFT TISSUES AND SKULL: No acute soft  tissue abnormality. No skull fracture. IMPRESSION: 1. Normal for age non-contrast CT appearance of the brain. 2. Advanced sphenoid sinus disease with acute on chronic features, no complication. Electronically signed by: Helayne Hurst MD 11/14/2023 10:14 AM EST RP Workstation: [id number]   CT T-SPINE NO CHARGE Result Date: 11/13/2023 EXAM: CT THORACIC SPINE WITHOUT CONTRAST 11/13/2023 10:37:38 PM TECHNIQUE: CT of the thoracic spine was performed without the administration of intravenous contrast. Multiplanar reformatted images are provided for review. Automated exposure control, iterative reconstruction, and/or weight based adjustment of the mA/kV was utilized to reduce the radiation dose to as low as reasonably achievable. COMPARISON: None available. CLINICAL HISTORY: FINDINGS: BONES AND ALIGNMENT: Thoracolumbar to sacrum and iliac surgical hardware. Right thoracolumbar scoliosis. No CT finding to suggest surgical hardware acute complication. Diffusely decreased bone density. Chronic L2 compression fracture stable. No acute fracture or suspicious bone lesion. DEGENERATIVE CHANGES: No significant degenerative changes. SOFT TISSUES: No acute abnormality. IMPRESSION: 1. Postoperative thoracolumbar to sacrum and iliac hardware without CT evidence of acute complication. No acute abnormality of the thoracolumbar spine. 2. Diffusely decreased bone density. Electronically signed by: Morgane Naveau MD 11/13/2023 11:15 PM EST RP Workstation: [id number]   CT L-SPINE NO CHARGE Result Date: 11/13/2023 EXAM: CT OF THE LUMBAR SPINE WITHOUT CONTRAST 11/13/2023 10:37:38 PM TECHNIQUE: CT of the lumbar spine was performed without the administration of intravenous contrast. Multiplanar reformatted images are provided for review. Automated exposure control, iterative reconstruction, and/or weight based adjustment of the mA/kV was utilized to reduce the radiation dose to as low as reasonably achievable. COMPARISON:N None available.  CLINICAL HISTORY: FINDINGS: BONES AND ALIGNMENT: Thoracolumbar to sacrum and iliac surgical hardware. Right thoracolumbar scoliosis. Diffusely decreased bone density. Chronic L2 compression fracture stable. No CT finding to suggest surgical hardware acute complication. Normal vertebral body heights (except L2). No acute fracture or suspicious bone lesion. DEGENERATIVE CHANGES: No significant degenerative changes. SOFT TISSUES: No acute abnormality. IMPRESSION: 1. Thoracolumbar to sacrum and iliac surgical hardware without CT evidence of acute complication. Electronically signed by: Morgane Naveau MD 11/13/2023 11:14 PM EST RP Workstation: [id number]   CT CHEST ABDOMEN PELVIS W CONTRAST Result Date: 11/13/2023 EXAM: CT CHEST, ABDOMEN AND PELVIS WITH CONTRAST 11/13/2023 10:37:38 PM TECHNIQUE: CT of the chest, abdomen and pelvis was performed with the administration of 100mL iohexol  (OMNIPAQUE ) 300 MG/ML solution. Multiplanar reformatted images are provided for review. Automated exposure control, iterative reconstruction, and/or weight based adjustment of the mA/kV was utilized to reduce the radiation dose to as low as reasonably achievable. COMPARISON: None available. CLINICAL HISTORY: Polytrauma, blunt. FINDINGS: CHEST: MEDIASTINUM AND LYMPH NODES: Heart and pericardium are unremarkable. The central airways are clear. No mediastinal, hilar or axillary lymphadenopathy. No mediastinal hematoma. No pneumomediastinum. LUNGS AND PLEURA: No focal consolidation or pulmonary edema. No pleural effusion or pneumothorax. ABDOMEN AND PELVIS: LIVER: The liver is unremarkable. GALLBLADDER AND BILE DUCTS: Status post cholecystectomy. No biliary ductal dilatation. SPLEEN: No acute abnormality.  PANCREAS: No acute abnormality. ADRENAL GLANDS: No acute abnormality. KIDNEYS, URETERS AND BLADDER: No stones in the kidneys or ureters. No hydronephrosis. No perinephric or periureteral stranding. Urinary bladder is unremarkable. GI AND  BOWEL: Stomach demonstrates no acute abnormality. There is no bowel obstruction. The appendix is unremarkable. REPRODUCTIVE ORGANS: Status post hysterectomy. No adnexal mass. PERITONEUM AND RETROPERITONEUM: No ascites. No free air. No mesenteric hematoma. VASCULATURE: Aorta is normal in caliber. ABDOMINAL AND PELVIS LYMPH NODES: No lymphadenopathy. BONES AND SOFT TISSUES: Left chest wall dual cardiac pacemaker and defibrillator. Please see separately dictated CT thoracolumbar spine. No acute bilateral hip or pelvic abnormalities fracture. No acute displaced rib fracture. No acute displaced sternal fracture. No sacral fracture. 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 Right medial soft tissue acute hematoma measuring up to 13 x 6 cm. IMPRESSION: 1. Right medial soft tissue acute hematoma measuring up to 13 x 6 cm. No mesenteric or mediastinal hematoma. 2. No acute intrathoracic, intraabdominal, or intrapelvic abnormality. Electronically signed by: Morgane Naveau MD 11/13/2023 11:12 PM EST RP Workstation: [id number]   CT Head Wo Contrast Result Date: 11/13/2023 EXAM: CT HEAD AND CERVICAL SPINE 11/13/2023 10:37:38 PM TECHNIQUE: CT of the head and cervical spine was performed without the administration of intravenous contrast. Multiplanar reformatted images are provided for review. Automated exposure control, iterative reconstruction, and/or weight based adjustment of the mA/kV was utilized to reduce the radiation dose to as low as reasonably achievable. COMPARISON: CT cervical spine 09/23/2022. CT head 06/24/2013 CLINICAL HISTORY: Head trauma, moderate-severe FINDINGS: CT HEAD BRAIN AND VENTRICLES: No acute intracranial hemorrhage. No mass effect or midline shift. No abnormal extra-axial fluid collection. No evidence of acute infarct. No hydrocephalus. ORBITS: No acute abnormality. SINUSES AND MASTOIDS: Chronically opacified and expanded right posterior ethmoid air cells and  sphenoid sinus. No mastoid effusions. SOFT TISSUES AND SKULL: No acute skull fracture. No acute soft tissue abnormality. CT CERVICAL SPINE BONES AND ALIGNMENT: No acute fracture or traumatic malalignment. Unchanged dextrocurvature. DEGENERATIVE CHANGES: Multilevel degenerative change including likely moderate foraminal stenosis bilaterally at C5-C6 and C6-C7. SOFT TISSUES: No prevertebral soft tissue swelling. IMPRESSION: 1. No acute intracranial abnormality. 2. No acute fracture or traumatic malalignment of the cervical spine. 3. Chronically opacified and expanded right posterior ethmoid air cells and sphenoid sinus, suggestive of mucocele. Electronically signed by: Gilmore Molt MD 11/13/2023 11:04 PM EST RP Workstation: [id number]   CT Cervical Spine Wo Contrast Result Date: 11/13/2023 EXAM: CT HEAD AND CERVICAL SPINE 11/13/2023 10:37:38 PM TECHNIQUE: CT of the head and cervical spine was performed without the administration of intravenous contrast. Multiplanar reformatted images are provided for review. Automated exposure control, iterative reconstruction, and/or weight based adjustment of the mA/kV was utilized to reduce the radiation dose to as low as reasonably achievable. COMPARISON: CT cervical spine 09/23/2022. CT head 06/24/2013 CLINICAL HISTORY: Head trauma, moderate-severe FINDINGS: CT HEAD BRAIN AND VENTRICLES: No acute intracranial hemorrhage. No mass effect or midline shift. No abnormal extra-axial fluid collection. No evidence of acute infarct. No hydrocephalus. ORBITS: No acute abnormality. SINUSES AND MASTOIDS: Chronically opacified and expanded right posterior ethmoid air cells and sphenoid sinus. No mastoid effusions. SOFT TISSUES AND SKULL: No acute skull fracture. No acute soft tissue abnormality. CT CERVICAL SPINE BONES AND ALIGNMENT: No acute fracture or traumatic malalignment. Unchanged dextrocurvature. DEGENERATIVE CHANGES: Multilevel degenerative change including likely moderate  foraminal stenosis bilaterally at C5-C6 and C6-C7. SOFT TISSUES: No prevertebral soft tissue swelling. IMPRESSION: 1. No acute intracranial abnormality. 2.  No acute fracture or traumatic malalignment of the cervical spine. 3. Chronically opacified and expanded right posterior ethmoid air cells and sphenoid sinus, suggestive of mucocele. Electronically signed by: Gilmore Molt MD 11/13/2023 11:04 PM EST RP Workstation: [id number]   DG Elbow Complete Left Result Date: 11/13/2023 EXAM: 3 VIEW(S) XRAY OF THE LEFT ELBOW COMPARISON: None available. CLINICAL HISTORY: fall, pain FINDINGS: BONES AND JOINTS: Left elbow joint effusion noted. No visible fracture but question of an occult fracture given the effusion. No joint dislocation. SOFT TISSUES: The soft tissues are unremarkable. IMPRESSION: 1. Left elbow joint effusion concerning for an occult fracture. 2. No visible fracture identified. Consider immobilization and repeat imaging in 1 week if symptoms persist. Electronically signed by: Franky Crease MD 11/13/2023 10:34 PM EST RP Workstation: [id number]    Microbiology: Results for orders placed or performed in visit on 11/01/23  Microscopic Examination     Status: None   Collection Time: 11/01/23 12:00 AM   Urine  Result Value Ref Range Status   WBC, UA 0-5 0 - 5 /hpf Final   RBC, Urine 0-2 0 - 2 /hpf Final   Epithelial Cells (non renal) 0-10 0 - 10 /hpf Final   Bacteria, UA None seen None seen/Few Final    Labs: CBC: Recent Labs  Lab 11/13/23 2139 11/14/23 0737 11/14/23 1212 11/14/23 2119 11/15/23 0530 11/16/23 0547  WBC 14.1* 8.8  --   --  6.6 7.5  NEUTROABS 10.0*  --   --   --   --   --   HGB 13.5 10.0* 8.6* 10.3* 10.0* 9.5*  HCT 40.1 30.0* 26.9* 31.8* 31.0* 29.2*  MCV 89.9 90.4  --   --  93.1 92.4  PLT 306 239  --   --  156 226   Basic Metabolic Panel: Recent Labs  Lab 11/13/23 2139 11/15/23 0530  NA 141 141  K 3.9 3.8  CL 106 109  CO2 27 25  GLUCOSE 107* 87  BUN 23 18   CREATININE 1.12* 0.92  CALCIUM 9.4 8.5*   Liver Function Tests: Recent Labs  Lab 11/13/23 2139 11/15/23 0530  AST 27 24  ALT 20 17  ALKPHOS 89 69  BILITOT 0.3 0.5  PROT 6.4* 5.6*  ALBUMIN  4.1 3.3*   CBG: No results for input(s): GLUCAP in the last 168 hours.  Discharge time spent: less than 30 minutes.  Signed: Toribio Door, MD [facility] 11/16/2023

## 2023-11-16 NOTE — Plan of Care (Signed)
  Problem: Education: Goal: Knowledge of General Education information will improve Description: Including pain rating scale, medication(s)/side effects and non-pharmacologic comfort measures 11/16/2023 1211 by Rosanne Elspeth HERO, RN Outcome: Adequate for Discharge 11/16/2023 1132 by Rosanne Elspeth HERO, RN Outcome: Progressing   Problem: Health Behavior/Discharge Planning: Goal: Ability to manage health-related needs will improve 11/16/2023 1211 by Rosanne Elspeth HERO, RN Outcome: Adequate for Discharge 11/16/2023 1132 by Rosanne Elspeth HERO, RN Outcome: Progressing   Problem: Clinical Measurements: Goal: Ability to maintain clinical measurements within normal limits will improve 11/16/2023 1211 by Rosanne Elspeth HERO, RN Outcome: Adequate for Discharge 11/16/2023 1132 by Rosanne Elspeth HERO, RN Outcome: Progressing Goal: Will remain free from infection 11/16/2023 1211 by Rosanne Elspeth HERO, RN Outcome: Adequate for Discharge 11/16/2023 1132 by Rosanne Elspeth HERO, RN Outcome: Progressing Goal: Diagnostic test results will improve 11/16/2023 1211 by Rosanne Elspeth HERO, RN Outcome: Adequate for Discharge 11/16/2023 1132 by Rosanne Elspeth HERO, RN Outcome: Progressing Goal: Respiratory complications will improve 11/16/2023 1211 by Rosanne Elspeth HERO, RN Outcome: Adequate for Discharge 11/16/2023 1132 by Rosanne Elspeth HERO, RN Outcome: Progressing Goal: Cardiovascular complication will be avoided 11/16/2023 1211 by Rosanne Elspeth HERO, RN Outcome: Adequate for Discharge 11/16/2023 1132 by Rosanne Elspeth HERO, RN Outcome: Progressing   Problem: Activity: Goal: Risk for activity intolerance will decrease 11/16/2023 1211 by Rosanne Elspeth HERO, RN Outcome: Adequate for Discharge 11/16/2023 1132 by Rosanne Elspeth HERO, RN Outcome: Progressing   Problem: Nutrition: Goal: Adequate nutrition will be maintained 11/16/2023 1211 by Rosanne Elspeth HERO, RN Outcome:  Adequate for Discharge 11/16/2023 1132 by Rosanne Elspeth HERO, RN Outcome: Progressing   Problem: Coping: Goal: Level of anxiety will decrease 11/16/2023 1211 by Rosanne Elspeth HERO, RN Outcome: Adequate for Discharge 11/16/2023 1132 by Rosanne Elspeth HERO, RN Outcome: Progressing   Problem: Elimination: Goal: Will not experience complications related to bowel motility 11/16/2023 1211 by Rosanne Elspeth HERO, RN Outcome: Adequate for Discharge 11/16/2023 1132 by Rosanne Elspeth HERO, RN Outcome: Progressing Goal: Will not experience complications related to urinary retention 11/16/2023 1211 by Rosanne Elspeth HERO, RN Outcome: Adequate for Discharge 11/16/2023 1132 by Rosanne Elspeth HERO, RN Outcome: Progressing   Problem: Pain Managment: Goal: General experience of comfort will improve and/or be controlled 11/16/2023 1211 by Rosanne Elspeth HERO, RN Outcome: Adequate for Discharge 11/16/2023 1132 by Rosanne Elspeth HERO, RN Outcome: Progressing   Problem: Safety: Goal: Ability to remain free from injury will improve 11/16/2023 1211 by Rosanne Elspeth HERO, RN Outcome: Adequate for Discharge 11/16/2023 1132 by Rosanne Elspeth HERO, RN Outcome: Progressing   Problem: Skin Integrity: Goal: Risk for impaired skin integrity will decrease 11/16/2023 1211 by Rosanne Elspeth HERO, RN Outcome: Adequate for Discharge 11/16/2023 1132 by Rosanne Elspeth HERO, RN Outcome: Progressing

## 2023-11-16 NOTE — Plan of Care (Signed)

## 2023-11-16 NOTE — Plan of Care (Signed)
  Problem: Education: Goal: Knowledge of General Education information will improve Description: Including pain rating scale, medication(s)/side effects and non-pharmacologic comfort measures Outcome: Progressing   Problem: Activity: Goal: Risk for activity intolerance will decrease Outcome: Progressing   Problem: Elimination: Goal: Will not experience complications related to bowel motility Outcome: Progressing   Problem: Safety: Goal: Ability to remain free from injury will improve Outcome: Progressing   Problem: Skin Integrity: Goal: Risk for impaired skin integrity will decrease Outcome: Progressing   

## 2023-11-16 NOTE — Progress Notes (Signed)
 [facility] Progress Note     Subjective: CC:  Overall feeling better. Had some pain over her right buttock/hip last night that improved with robaxin. Worked with PT and set-up with outpatient PT.   Hgb 9.5 from 10, VSS   Objective: Vital signs in last 24 hours: Temp:  [97.9 F (36.6 C)-98 F (36.7 C)] 97.9 F (36.6 C) (11/06 0435) Pulse Rate:  [75-85] 85 (11/06 0435) Resp:  [16-18] 16 (11/06 0435) BP: (117-134)/(46-64) 117/64 (11/06 0435) SpO2:  [97 %-99 %] 97 % (11/06 0435) Last BM Date : 11/14/23  Intake/Output from previous day: 11/05 0701 - 11/06 0700 In: 840 [P.O.:840] Out: -  Intake/Output this shift: No intake/output data recorded.  PE: Gen:  Alert, NAD, pleasant Card:  Regular rate and rhythm Pulm:  Normal effort ORA Abd: Soft, non-tender, non-distended Skin: warm and dry, there is scattered ecchymosis of the lower back, sacrum, and buttocks, fullness over the right hip/buttock consistent with hematoma - stable compared to yesterday.  No cellulitis. Psych: A&Ox3   Lab Results:  Recent Labs    11/15/23 0530 11/16/23 0547  WBC 6.6 7.5  HGB 10.0* 9.5*  HCT 31.0* 29.2*  PLT 156 226   BMET Recent Labs    11/13/23 2139 11/15/23 0530  NA 141 141  K 3.9 3.8  CL 106 109  CO2 27 25  GLUCOSE 107* 87  BUN 23 18  CREATININE 1.12* 0.92  CALCIUM 9.4 8.5*   PT/INR Recent Labs    11/14/23 1603  LABPROT 14.9  INR 1.1   CMP     Component Value Date/Time   NA 141 11/15/2023 0530   NA 144 12/05/2022 1432   K 3.8 11/15/2023 0530   CL 109 11/15/2023 0530   CO2 25 11/15/2023 0530   GLUCOSE 87 11/15/2023 0530   BUN 18 11/15/2023 0530   BUN 22 12/05/2022 1432   CREATININE 0.92 11/15/2023 0530   CREATININE 1.05 (H) 03/06/2023 1321   CALCIUM 8.5 (L) 11/15/2023 0530   PROT 5.6 (L) 11/15/2023 0530   ALBUMIN  3.3 (L) 11/15/2023 0530   AST 24 11/15/2023 0530   AST 28 02/04/2021 1428   ALT 17 11/15/2023 0530   ALT 25 02/04/2021 1428    ALKPHOS 69 11/15/2023 0530   BILITOT 0.5 11/15/2023 0530   BILITOT 0.4 02/04/2021 1428   GFRNONAA >60 11/15/2023 0530   GFRNONAA 58 (L) 03/06/2023 1321   GFRAA >60 02/27/2017 0619   Lipase     Component Value Date/Time   LIPASE 31 02/25/2017 1736       Studies/Results: CT ELBOW LEFT WO CONTRAST Result Date: 11/14/2023 EXAM: CT LEFT ELBOW, WITHOUT IV CONTRAST TECHNIQUE: Axial images were acquired through the left elbow without IV contrast. Reformatted images were reviewed. Automated exposure control, iterative reconstruction, and/or weight based adjustment of the mA/kV was utilized to reduce the radiation dose to as low as reasonably achievable. COMPARISON: Colon radiographs 11/13/2023. CLINICAL HISTORY: Elbow trauma. FINDINGS: BONES AND JOINTS: Small elbow joint effusion observed. No appreciable fracture or acute bony findings. No dislocation. The joint spaces are normal. SOFT TISSUES: Subcutaneous edema superficial to the long head of the triceps and surrounds the ulnar nerve at the level of the distal humerus correlate with any ulnar neuropathy. SKULL AND CERVICAL SPINE: Multilevel spinal fixation and an AICD lead are demonstrated. IMPRESSION: 1. Small elbow joint effusion. 2. No acute osseous abnormality. 3. Subcutaneous edema superficial to the long head of the triceps and surrounding the ulnar nerve  at the level of the distal humerus; consider evaluation for ulnar neuropathy. Electronically signed by: Ryan Salvage MD 11/14/2023 02:13 PM EST RP Workstation: [id number]    Anti-infectives: Anti-infectives (From admission, onward)    None         Latest Ref Rng & Units 11/16/2023    5:47 AM 11/15/2023    5:30 AM 11/14/2023    9:19 PM  CBC  WBC 4.0 - 10.5 K/uL 7.5  6.6    Hemoglobin 12.0 - 15.0 g/dL 9.5  89.9  89.6   Hematocrit 36.0 - 46.0 % 29.2  31.0  31.8   Platelets 150 - 400 K/uL 226  156       Assessment/Plan  68 y/o F s/p ground level fall Right lower back/flank  hematoma - hold Eliquis , continue abdominal binder, trend hgb, transfuse PRN;  ABL anemia - due to above; hgb overall stable today Left elbow joint effusion - CT negative for fracture; reviewed with ortho PA-C Ozell Ned, sling for comfort, WBAT   FEN - ok for a solid diet from a trauma persepctive VTE - SCD's, hold chemical VTE ppx ID - none indicated Admit - to TRH service for MMP ; PT/OT   Ok for discharge from a trauma perspective. Has follow up with her PCP next week and can follow up in our trauma office as needed. Ok to resume Eliquis  on Saturday- discussed with patient who feels more comfortable resuming Eliquis  at/after her PCP appointment next week. Defer final decision to medical team.   Afib on Eliquis  -trend hemoglobin, hopefully can resume Eliquis  in the next 1 to 2 days Hx splenic infarction HTN CHF LBBB History of biventricular pacemaker Essential tremor Generalized anxiety disorder Post-surgical hypothyroidism  Migraine HA  Osteoporosis Back pain   LOS: 0 days   I reviewed nursing notes, hospitalist notes, last 24 h vitals and pain scores, last 48 h intake and output, last 24 h labs and trends, and last 24 h imaging results.  This care required moderate level of medical decision making.   Almarie Pringle, PA-C [facility] Please see Amion for pager number during day hours 7:00am-4:30pm

## 2023-11-21 ENCOUNTER — Ambulatory Visit: Payer: Medicare PPO

## 2023-11-21 DIAGNOSIS — I428 Other cardiomyopathies: Secondary | ICD-10-CM

## 2023-11-21 LAB — CUP PACEART REMOTE DEVICE CHECK
Battery Remaining Longevity: 34 mo
Battery Voltage: 2.91 V
Brady Statistic AP VP Percent: 15.99 %
Brady Statistic AP VS Percent: 0.38 %
Brady Statistic AS VP Percent: 81.97 %
Brady Statistic AS VS Percent: 1.66 %
Brady Statistic RA Percent Paced: 16.36 %
Brady Statistic RV Percent Paced: 10.72 %
Date Time Interrogation Session: 20251111112046
Implantable Lead Connection Status: 753985
Implantable Lead Connection Status: 753985
Implantable Lead Connection Status: 753985
Implantable Lead Implant Date: 20130110
Implantable Lead Implant Date: 20130110
Implantable Lead Implant Date: 20130110
Implantable Lead Location: 753858
Implantable Lead Location: 753859
Implantable Lead Location: 753860
Implantable Lead Model: 4196
Implantable Lead Model: 5076
Implantable Lead Model: 6935
Implantable Pulse Generator Implant Date: 20181115
Lead Channel Impedance Value: 1292 Ohm
Lead Channel Impedance Value: 304 Ohm
Lead Channel Impedance Value: 342 Ohm
Lead Channel Impedance Value: 627 Ohm
Lead Channel Impedance Value: 703 Ohm
Lead Channel Impedance Value: 798 Ohm
Lead Channel Impedance Value: 912 Ohm
Lead Channel Impedance Value: 912 Ohm
Lead Channel Impedance Value: 931 Ohm
Lead Channel Pacing Threshold Amplitude: 0.625 V
Lead Channel Pacing Threshold Amplitude: 1.625 V
Lead Channel Pacing Threshold Amplitude: 2.5 V
Lead Channel Pacing Threshold Pulse Width: 0.4 ms
Lead Channel Pacing Threshold Pulse Width: 0.4 ms
Lead Channel Pacing Threshold Pulse Width: 0.8 ms
Lead Channel Sensing Intrinsic Amplitude: 0.5 mV
Lead Channel Sensing Intrinsic Amplitude: 0.5 mV
Lead Channel Sensing Intrinsic Amplitude: 18.875 mV
Lead Channel Sensing Intrinsic Amplitude: 18.875 mV
Lead Channel Setting Pacing Amplitude: 2 V
Lead Channel Setting Pacing Amplitude: 2.5 V
Lead Channel Setting Pacing Amplitude: 3.5 V
Lead Channel Setting Pacing Pulse Width: 0.8 ms
Lead Channel Setting Pacing Pulse Width: 1 ms
Lead Channel Setting Sensing Sensitivity: 0.9 mV
Zone Setting Status: 755011
Zone Setting Status: 755011

## 2023-11-22 ENCOUNTER — Ambulatory Visit: Payer: Self-pay

## 2023-11-22 NOTE — Telephone Encounter (Signed)
 Wrong office. Forwarding to [employer].

## 2023-11-22 NOTE — Telephone Encounter (Signed)
 Pt scheduled for 11/24/2023

## 2023-11-22 NOTE — Telephone Encounter (Signed)
 FYI Only or Action Required?: FYI only for provider: appointment scheduled on 11/24/2023.  Patient was last seen in primary care on 10/27/2023 by Jerrell Cleatus Ned, MD.  Called Nurse Triage reporting Back Pain.  Symptoms began yesterday.  Interventions attempted: Prescription medications: muscle relaxer and tylenol .  Symptoms are: gradually worsening.  Triage Disposition: See PCP When Office is Open (Within 3 Days)  Patient/caregiver understands and will follow disposition?: Yes   Copied from CRM [id number]. Topic: Clinical - Red Word Triage >> Nov 22, 2023  2:58 PM Roselie BROCKS wrote: Red Word that prompted transfer to Nurse Triage: Patient states she has a hematoma on her back that is very painful. Unable to sleep at all, due to it.and extremely constipated, with severe pain when push to use restroom. Reason for Disposition  [1] MODERATE back pain (e.g., interferes with normal activities) AND [2] present > 3 days  Answer Assessment - Initial Assessment Questions 1. ONSET: When did the pain begin? (e.g., minutes, hours, days)     Tuesday 2. LOCATION: Where does it hurt? (upper, mid or lower back)     Low back right hip area 3. SEVERITY: How bad is the pain?  (e.g., Scale 1-10; mild, moderate, or severe)     Moderate to severe 4. PATTERN: Is the pain constant? (e.g., yes, no; constant, intermittent)      constant 5. RADIATION: Does the pain shoot into your legs or somewhere else?     na 6. CAUSE:  What do you think is causing the back pain?      fell 7. BACK OVERUSE:  Any recent lifting of heavy objects, strenuous work or exercise?     na 8. MEDICINES: What have you taken so far for the pain? (e.g., nothing, acetaminophen , NSAIDS)     Taking pain medication - tylenol  without relief and taking muscle relaxer 9. NEUROLOGIC SYMPTOMS: Do you have any weakness, numbness, or problems with bowel/bladder control?     no 10. OTHER SYMPTOMS: Do you have any other  symptoms? (e.g., fever, abdomen pain, burning with urination, blood in urine)       Hematoma,  11. PREGNANCY: Is there any chance you are pregnant? When was your last menstrual period?       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  Constipated and painful to push out bowel movement. Sunday pt took dulcolax: without relief.  Pt is using walker to ambulate now. Pt stated she is unable to rest at night and has been taking trazadone without rest.  Offered pt appt for tomorrow with different provider but pt stated she only wants to be seen by her PCP; scheduled appt on 11/24/2023 with PCP.  Protocols used: Back Pain-A-AH

## 2023-11-23 NOTE — Progress Notes (Addendum)
 Subjective:   [name] is a 68 y.o. female who presents for a Medicare Annual Wellness Visit.   I connected with Josclyn F. Cogswell on 11/13/23 by a audio enabled telemedicine application and verified that I am speaking with the correct person using two identifiers.  Patient Location: Home  Provider Location: Home Office  Persons Participating in Visit: Patient.  I discussed the limitations of evaluation and management by telemedicine. The patient expressed understanding and agreed to proceed.  Vital Signs: Because this visit was a virtual/telehealth visit, some criteria may be missing or patient reported. Any vitals not documented were not able to be obtained and vitals that have been documented are patient reported.  Allergies (verified) Other, Iodine, Tape, Codeine, Fentanyl , and Morphine  and codeine   History: Past Medical History:  Diagnosis Date   AF (paroxysmal atrial fibrillation) (HCC)    Allergy    Anxiety    Arthritis    BCC (basal cell carcinoma) 03/11/2003   left distal lower leg ankle-tx dr helga   Biventricular cardiac pacemaker in situ    a. prior CRT-D in 2013 with downgrade to CRT-Pacemaker only in 11/2016.   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 2012: normal coronaries, EF 25-30%. b. improved  to 55% 11/2016.   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   Past Surgical History:  Procedure Laterality Date   ARTERY BIOPSY  12/29/2011   Procedure: MINOR BIOPSY TEMPORAL ARTERY;  Surgeon: Morene ONEIDA Olives, MD;  Location: [facility];  Service: General;  Laterality: Right;  Right temporal artery biopsy   BACK SURGERY  12/31/2009   for flat back syndrome; fused bottom of my back   BI-VENTRICULAR IMPLANTABLE CARDIOVERTER DEFIBRILLATOR Left 01/20/2011   Procedure: BI-VENTRICULAR IMPLANTABLE CARDIOVERTER DEFIBRILLATOR  (CRT-D);  Surgeon: Danelle LELON Birmingham, MD;  [facility];  Service: Cardiovascular;  Laterality: Left;   BIV ICD GENERATOR CHANGEOUT N/A 11/24/2016   Procedure: BIV ICD GENERATOR CHANGEOUT;  Surgeon: Birmingham Danelle LELON, MD;  Location: [facility] INVASIVE CV LAB;  Service: Cardiovascular;  Laterality: N/A;   BREAST LUMPECTOMY WITH RADIOACTIVE SEED LOCALIZATION Left 01/22/2020   Procedure: LEFT BREAST LUMPECTOMY WITH RADIOACTIVE SEED LOCALIZATION;  Surgeon: Belinda Cough, MD;  Location: [facility];  Service: General;  Laterality: Left;  LMA   BREAST SURGERY     CERVICAL ABLATION  03/29/2023   CHOLECYSTECTOMY  ~1996  FOOT NEUROMA SURGERY  ~ 2003   right   RE-EXCISION OF BREAST LUMPECTOMY Left 03/03/2020   Procedure: RE-EXCISION MARGINS OF LEFT BREAST LUMPECTOMY;  Surgeon: Belinda Cough, MD;  Location: [facility];  Service: General;  Laterality: Left;   ROTATOR CUFF REPAIR  ~ 2009   left   SHOULDER ARTHROSCOPY W/ ROTATOR CUFF REPAIR Right 09/21/2011   SKIN CANCER EXCISION     nose, forehead, left leg   SPINE SURGERY     TEE  WITHOUT CARDIOVERSION N/A 03/01/2017   Procedure: TRANSESOPHAGEAL ECHOCARDIOGRAM (TEE);  Surgeon: Raford Riggs, MD;  Location: [facility] ENDOSCOPY;  Service: Cardiovascular;  Laterality: N/A;   THYROID  LOBECTOMY  ~ 2006   right   TUBAL LIGATION  ~ 1983   VAGINAL HYSTERECTOMY  ~ 2009   VESICOVAGINAL FISTULA CLOSURE W/ TAH     Family History  Problem Relation Age of Onset   Heart disease Mother    Heart failure Mother 57       CHF   Stroke Mother    Hypertension Mother    Varicose Veins Mother    Heart disease Father 46   Macular degeneration Father    Hearing loss Father    Vision loss Father    Heart attack Sister    Depression Sister    Heart disease Sister    Varicose Veins Sister    Atrial fibrillation Sister    Heart disease Sister    Arthritis Sister    Social History   Occupational History   Occupation: Runner, Broadcasting/film/video    Comment: n/a   Occupation: RETIRED  Tobacco Use   Smoking status: Never    Passive exposure: Never   Smokeless tobacco: Never  Vaping Use   Vaping status: Never Used  Substance and Sexual Activity   Alcohol  use: No   Drug use: No   Sexual activity: Yes   Tobacco Counseling Counseling given: Not Answered  SDOH Screenings   Food Insecurity: No Food Insecurity (11/14/2023)  Housing: Unknown (11/14/2023)  Transportation Needs: No Transportation Needs (11/14/2023)  Utilities: Not At Risk (11/14/2023)  Alcohol  Screen: Low Risk  (09/20/2022)  Depression (PHQ2-9): Low Risk  (11/13/2023)  Financial Resource Strain: Low Risk  (01/25/2023)  Physical Activity: Sufficiently Active (11/13/2023)  Social Connections: Socially Integrated (11/14/2023)  Stress: No Stress Concern Present (11/13/2023)  Tobacco Use: Low Risk  (11/13/2023)  Health Literacy: Adequate Health Literacy (11/13/2023)   Depression Screen    11/13/2023    3:15 PM 07/10/2023    2:50 PM 06/21/2023   10:28 AM 04/10/2023    1:43 PM 01/09/2023    2:18 PM 12/05/2022    1:47 PM 09/20/2022    1:51 PM   PHQ 2/9 Scores  PHQ - 2 Score 0 2 2 6 1 4  0  PHQ- 9 Score 4  8  9  16  9  15        Data saved with a previous flowsheet row definition     Goals Addressed             This Visit's Progress    Increase physical activity       For stiffness/flexibility/2025       Visit info / Clinical Intake: Medicare Wellness Visit Type:: Subsequent Annual Wellness Visit Medicare Wellness Visit Mode:: Telephone If telephone:: video declined Because this visit was a virtual/telehealth visit:: unable to obtan vitals due to lack of equipment Interpreter Needed?: No Pre-visit prep was completed: no AWV questionnaire completed by patient  prior to visit?: no Living arrangements:: lives with spouse/significant other Patient's Overall Health Status Rating: (!) fair Typical amount of pain: none Does pain affect daily life?: no Are you currently prescribed opioids?: no  Dietary Habits and Nutritional Risks How many meals a day?: 3 Eats fruit and vegetables daily?: yes Most meals are obtained by: preparing own meals; eating out Diabetic:: no  Functional Status Activities of Daily Living (to include ambulation/medication): Independent Ambulation: Independent with device- listed below Home Assistive Devices/Equipment: Eyeglasses Medication Administration: Independent Home Management: Independent Manage your own finances?: yes Primary transportation is: driving Concerns about vision?: no *vision screening is required for WTM* Concerns about hearing?: (!) yes Uses hearing aids?: no Hear whispered voice?: -- (has ask people to repeat themselves)  Fall Screening Falls in the past year?: 1 Number of falls in past year: 0 Was there an injury with Fall?: 0 Fall Risk Category Calculator: 1 Patient Fall Risk Level: High fall risk  Fall Risk Patient at Risk for Falls Due to: No Fall Risks Fall risk Follow up: Falls evaluation completed; Falls prevention discussed  Home and Transportation  Safety: All rugs have non-skid backing?: yes All stairs or steps have railings?: N/A, no stairs Grab bars in the bathtub or shower?: yes Have non-skid surface in bathtub or shower?: yes (walk in shower) Good home lighting?: yes Regular seat belt use?: yes Hospital stays in the last year:: no  Cognitive Assessment Difficulty concentrating, remembering, or making decisions? : no Will 6CIT or Mini Cog be Completed: no 6CIT or Mini Cog Declined: patient alert, oriented, able to answer questions appropriately and recall recent events  Advance Directives (For Healthcare) Does Patient Have a Medical Advance Directive?: No Does patient want to make changes to medical advance directive?: No - Patient declined Type of Advance Directive: Living will; Healthcare Power of Attorney Copy of Healthcare Power of Attorney in Chart?: No - copy requested Copy of Living Will in Chart?: No - copy requested Would patient like information on creating a medical advance directive?: No - Patient declined  Reviewed/Updated  Reviewed/Updated: All        Objective:    Today's Vitals   11/13/23 1500  Weight: 162 lb (73.5 kg)  Height: 5' 7 (1.702 m)   Body mass index is 25.37 kg/m.  Current Medications (verified) Outpatient Encounter Medications as of 11/13/2023  Medication Sig   albuterol  (VENTOLIN  HFA) 108 (90 Base) MCG/ACT inhaler Inhale 2 puffs into the lungs every 6 (six) hours as needed for wheezing or shortness of breath.   anastrozole  (ARIMIDEX ) 1 MG tablet Take 1 tablet (1 mg total) by mouth daily.   apixaban  (ELIQUIS ) 5 MG TABS tablet TAKE 1 TABLET TWICE DAILY   eletriptan  (RELPAX ) 40 MG tablet Take 1 tablet (40 mg total) by mouth every 2 (two) hours as needed for migraine or headache. May repeat in 2 hours if headache persists or recurs.   gabapentin  (NEURONTIN ) 600 MG tablet Take 600 mg by mouth 2 (two) times daily.   levothyroxine  (SYNTHROID ) 100 MCG tablet Take 1 tablet (100 mcg total) by  mouth daily before breakfast.   propranolol  (INDERAL ) 60 MG tablet Take 1 tablet (60 mg total) by mouth daily.   spironolactone  (ALDACTONE ) 25 MG tablet Take 0.5 tablets (12.5 mg total) by mouth daily.   traZODone (DESYREL) 50 MG tablet Take 0.5 tablets (25 mg total) by mouth at bedtime as needed for sleep.   [DISCONTINUED] omeprazole  (PRILOSEC) 40 MG capsule Take 1 capsule (40 mg  total) by mouth daily. (Patient not taking: Reported on 11/15/2023)   Facility-Administered Encounter Medications as of 11/13/2023  Medication   [COMPLETED] Romosozumab -aqqg (EVENITY ) 105 MG/1. injection 210 mg   [START ON 12/10/2023] Romosozumab -aqqg (EVENITY ) 105 MG/1. injection 210 mg   Hearing/Vision screen Hearing Screening - Comments:: Noticed some hearing loss-per pt/asking for people to repeat Vision Screening - Comments:: Wears eyeglasses/Dr. Trine pt -up todate Immunizations and Health Maintenance Health Maintenance  Topic Date Due   Hepatitis C Screening  Never done   Zoster Vaccines- Shingrix (1 of 2) 10/19/1972   Mammogram  12/26/2023   COVID-19 Vaccine (7 - 2025-26 season) 04/26/2024 (Originally 09/11/2023)   DEXA SCAN  03/09/2024   Colonoscopy  04/06/2024   Medicare Annual Wellness (AWV)  11/12/2024   DTaP/Tdap/Td (4 - Td or Tdap) 01/13/2033   Pneumococcal Vaccine: 50+ Years  Completed   Influenza Vaccine  Completed   Meningococcal B Vaccine  Aged Out        Assessment/Plan:  This is a routine wellness examination for Daya.  Patient Care Team: Jerrell Cleatus Ned, MD as PCP - General (Internal Medicine) Waddell Danelle ORN, MD as PCP - Electrophysiology (Cardiology) Lonni Slain, MD as PCP - Cardiology (Cardiology) Cleotilde Ronal RAMAN, MD as Consulting Physician (Gynecology) Cloretta Arley NOVAK, MD as Consulting Physician (Oncology) Elicia Claw, MD as Consulting Physician (Gastroenterology) Mammography, [facility] (Diagnostic Radiology) Waylan Cain, MD as Consulting  Physician (Ophthalmology)  I have personally reviewed and noted the following in the patient's chart:   Medical and social history Use of alcohol , tobacco or illicit drugs  Current medications and supplements including opioid prescriptions. Functional ability and status Nutritional status Physical activity Advanced directives List of other physicians Hospitalizations, surgeries, and ER visits in previous 12 months Vitals Screenings to include cognitive, depression, and falls Referrals and appointments  No orders of the defined types were placed in this encounter.  In addition, I have reviewed and discussed with patient certain preventive protocols, quality metrics, and best practice recommendations. A written personalized care plan for preventive services as well as general preventive health recommendations were provided to patient.   Caterine Mcmeans L Lanaiya Lantry, CMA   11/13/2023   Return in 1 year (on 11/12/2024).  After Visit Summary: (MyChart) Due to this being a telephonic visit, the after visit summary with patients personalized plan was offered to patient via MyChart   Nurse Notes: Patient is due for a mammogram coming in December.  She is also due for a shingrix vaccine.  Patient had no other concerns to address today.

## 2023-11-24 ENCOUNTER — Telehealth: Payer: Self-pay

## 2023-11-24 ENCOUNTER — Ambulatory Visit (INDEPENDENT_AMBULATORY_CARE_PROVIDER_SITE_OTHER): Admitting: Student in an Organized Health Care Education/Training Program

## 2023-11-24 ENCOUNTER — Encounter: Payer: Self-pay | Admitting: Student in an Organized Health Care Education/Training Program

## 2023-11-24 VITALS — BP 109/62 | HR 55 | Wt 161.0 lb

## 2023-11-24 DIAGNOSIS — E611 Iron deficiency: Secondary | ICD-10-CM | POA: Diagnosis not present

## 2023-11-24 DIAGNOSIS — R5381 Other malaise: Secondary | ICD-10-CM

## 2023-11-24 DIAGNOSIS — F39 Unspecified mood [affective] disorder: Secondary | ICD-10-CM

## 2023-11-24 DIAGNOSIS — T148XXA Other injury of unspecified body region, initial encounter: Secondary | ICD-10-CM | POA: Diagnosis not present

## 2023-11-24 LAB — CBC
HCT: 36.6 % (ref 36.0–46.0)
Hemoglobin: 12.2 g/dL (ref 12.0–15.0)
MCHC: 33.5 g/dL (ref 30.0–36.0)
MCV: 92.4 fl (ref 78.0–100.0)
Platelets: 441 K/uL — ABNORMAL HIGH (ref 150.0–400.0)
RBC: 3.96 Mil/uL (ref 3.87–5.11)
RDW: 15.2 % (ref 11.5–15.5)
WBC: 9.4 K/uL (ref 4.0–10.5)

## 2023-11-24 LAB — IBC + FERRITIN
Ferritin: 95.2 ng/mL (ref 10.0–291.0)
Iron: 96 ug/dL (ref 42–145)
Saturation Ratios: 31.7 % (ref 20.0–50.0)
TIBC: 302.4 ug/dL (ref 250.0–450.0)
Transferrin: 216 mg/dL (ref 212.0–360.0)

## 2023-11-24 MED ORDER — ESCITALOPRAM OXALATE 10 MG PO TABS: 10.0000 mg | ORAL_TABLET | Freq: Every day | ORAL | 2 refills | Status: DC

## 2023-11-24 NOTE — Progress Notes (Signed)
 Remote PPM Transmission

## 2023-11-24 NOTE — Assessment & Plan Note (Signed)
 Previous medications were ineffective. Lexapro was discussed for depression and anxiety, considering its effects on sleep. Non-pharmacological measures were emphasized. Lexapro was prescribed for depression and anxiety, and non-pharmacological measures for sleep improvement were discussed.

## 2023-11-24 NOTE — Progress Notes (Signed)
 Acute Office Visit  Patient ID: [name], female    DOB: May 23, 1955, 68 y.o.   MRN: [id number]  PCP: Jerrell Cleatus Ned, MD  Chief Complaint  Patient presents with   Back Pain    Clemens in kitchen and hit counter.  Constipated and painful to push out bowel movement. Sunday pt took dulcolax: without relief.  Pt is using walker to ambulate now. Pt stated she is unable to rest at night and has been taking trazadone without rest.      Subjective:     HPI  Discussed the use of AI scribe software for clinical note transcription with the patient, who gave verbal consent to proceed.  History of Present Illness [name] is a 68 year old female who presents with a recent fall resulting in a hematoma and left arm injury.  She experienced a fall on Monday afternoon while picking something up in the kitchen, resulting in her hitting her back on a kitchen michaelfurt and twisting her left arm. Initially, a fracture was suspected, but it was later determined to be a bruise. She was admitted to the hospital on November 3rd and discharged on November 6th with a large hematoma on her hip. She was taken off apixaban  from Monday to Saturday and resumed it on Saturday. She used a left arm sling while in the hospital but is no longer using it.  She is concerned about her risk of future falls and is interested in physical therapy. She prefers water exercises but is unable to start until January due to scheduling. She is considering starting physical therapy earlier at a different location, although it would not be water-based. She currently uses a walker and is considering a rollator walker for better mobility.  She experiences restless legs, particularly at night, which she associates with tramadol  use. She stopped taking tramadol  and reports difficulty sleeping, sometimes staying awake until 5 AM. She has a history of using Wellbutrin  and Celexa for depression, which were not effective. She  previously used trazodone, which helped slightly, but she only took half doses. She is interested in trying a new medication for depression that could also aid with sleep.  She inquires about obtaining a handicap parking permit due to her mobility issues. She is also considering safety measures like a life alert system or an Apple Watch for fall detection, as she is concerned about being alone at home.      Objective:    BP 109/62   Pulse (!) 55   Wt 161 lb (73 kg)   BMI 25.21 kg/m   Physical Exam  Gen: Highly frail individual Skin: On her lower back and upper buttocks there is a very large hematoma with resolving ecchymosis. Heart: Irregular, no murmur Lungs: Unlabored and clear Ext: Warm, compression stockings in place, no lower extremity edema      Assessment & Plan:   Problem List Items Addressed This Visit       Medium    Mood disorder   Previous medications were ineffective. Lexapro was discussed for depression and anxiety, considering its effects on sleep. Non-pharmacological measures were emphasized. Lexapro was prescribed for depression and anxiety, and non-pharmacological measures for sleep improvement were discussed.      Relevant Medications   escitalopram (LEXAPRO) 10 MG tablet     Unprioritized   Hematoma   She is at high risk for falls due to muscle weakness and balance issues post-hospitalization. Physical therapy for strengthening and a rollator  walker for mobility were discussed. A life alert system was considered for safety. A rollator walker was ordered for long-term mobility support. She was referred to physical therapy for leg and core strengthening and recommended water therapy as a long-term plan. A life alert system was discussed for safety in case of falls.      Iron deficiency - Primary   I suspect the patient has an iron deficiency related to recent very large hematoma from the fall.  During hospitalization she had acute blood loss anemia.  She  has now developed restless leg symptoms.  Will check iron levels today and if iron deficiency is confirmed we will start oral iron replacement.      Relevant Orders   CBC   IBC + Ferritin   Physical deconditioning   Relevant Orders   For home use only DME 4 wheeled rolling walker with seat ([id number])    Meds ordered this encounter  Medications   escitalopram (LEXAPRO) 10 MG tablet    Sig: Take 1 tablet (10 mg total) by mouth at bedtime.    Dispense:  30 tablet    Refill:  2    Return in about 4 weeks (around 12/22/2023).  Cleatus Debby Specking, MD [facility]

## 2023-11-24 NOTE — Assessment & Plan Note (Signed)
 I suspect the patient has an iron deficiency related to recent very large hematoma from the fall.  During hospitalization she had acute blood loss anemia.  She has now developed restless leg symptoms.  Will check iron levels today and if iron deficiency is confirmed we will start oral iron replacement.

## 2023-11-24 NOTE — Patient Instructions (Signed)
  VISIT SUMMARY: During your visit, we discussed your recent fall, resulting in a left arm injury and a large hematoma on your hip. We also addressed your concerns about future falls, depression, anxiety, restless legs, and mobility issues.  YOUR PLAN: -RECENT FALL WITH LEFT ARM INJURY AND LARGE HIP HEMATOMA, HIGH RISK FOR FALLS: You are at high risk for falls due to muscle weakness and balance issues after your hospitalization. We discussed starting physical therapy to help strengthen your legs and core, and you were referred for this. A rollator walker was ordered to support your long-term mobility. We also talked about the benefits of a life alert system for your safety in case of future falls.  -DEPRESSION AND GENERALIZED ANXIETY DISORDER: Depression and anxiety are conditions that affect your mood and overall well-being. Since previous medications were not effective, we discussed starting Lexapro, which can help with both depression and anxiety and may also improve your sleep. We also talked about non-medication strategies to help improve your sleep.  -RESTLESS LEGS SYNDROME (SUSPECTED): Restless legs syndrome causes an uncontrollable urge to move your legs, often at night. This may be related to your previous use of trazodone, but we also want to check for iron deficiency as a possible cause. We ordered an iron level test to investigate this further.  INSTRUCTIONS: Please follow up with physical therapy as soon as possible to start your strengthening exercises. Use the rollator walker for better mobility and safety. Start taking Lexapro as prescribed for your depression and anxiety. We will check your iron levels to see if they are contributing to your restless legs. Consider getting a life alert system for added safety at home. If you have any questions or concerns, please contact our office.

## 2023-11-24 NOTE — Assessment & Plan Note (Signed)
 She is at high risk for falls due to muscle weakness and balance issues post-hospitalization. Physical therapy for strengthening and a rollator walker for mobility were discussed. A life alert system was considered for safety. A rollator walker was ordered for long-term mobility support. She was referred to physical therapy for leg and core strengthening and recommended water therapy as a long-term plan. A life alert system was discussed for safety in case of falls.

## 2023-11-24 NOTE — Telephone Encounter (Signed)
 DME orders Faxed to [facility] in .

## 2023-11-26 ENCOUNTER — Ambulatory Visit: Payer: Self-pay | Admitting: Internal Medicine

## 2023-11-27 ENCOUNTER — Other Ambulatory Visit: Payer: Self-pay | Admitting: Radiology

## 2023-11-27 ENCOUNTER — Ambulatory Visit: Admitting: Student in an Organized Health Care Education/Training Program

## 2023-11-27 ENCOUNTER — Ambulatory Visit: Payer: Self-pay | Admitting: Student in an Organized Health Care Education/Training Program

## 2023-11-27 MED ORDER — EVENITY 105 MG/1.17ML ~~LOC~~ SOSY: 210.0000 mg | PREFILLED_SYRINGE | Freq: Once | SUBCUTANEOUS | 0 refills | Status: AC

## 2023-12-05 ENCOUNTER — Encounter: Payer: Self-pay | Admitting: Physical Therapy

## 2023-12-05 ENCOUNTER — Ambulatory Visit: Admitting: Physical Therapy

## 2023-12-05 DIAGNOSIS — R293 Abnormal posture: Secondary | ICD-10-CM | POA: Diagnosis not present

## 2023-12-05 DIAGNOSIS — M79601 Pain in right arm: Secondary | ICD-10-CM

## 2023-12-05 DIAGNOSIS — M6281 Muscle weakness (generalized): Secondary | ICD-10-CM | POA: Diagnosis not present

## 2023-12-05 DIAGNOSIS — M79602 Pain in left arm: Secondary | ICD-10-CM

## 2023-12-05 DIAGNOSIS — R262 Difficulty in walking, not elsewhere classified: Secondary | ICD-10-CM

## 2023-12-05 NOTE — Therapy (Addendum)
 OUTPATIENT PHYSICAL THERAPY THORACOLUMBAR EVALUATION   Patient Name: [name] MRN: [id number] DOB:02-Mar-1955, 68 y.o., female Today's Date: 12/05/2023  END OF SESSION:  PT End of Session - 12/05/23 1223     Visit Number 1    Number of Visits 16    Date for Recertification  01/30/24    Authorization Type [employer] MCR    PT Start Time 1217    PT Stop Time 1308    PT Time Calculation (min) 51 min    Equipment Utilized During Treatment Gait belt    Activity Tolerance Patient tolerated treatment well    Behavior During Therapy WFL for tasks assessed/performed          Past Medical History:  Diagnosis Date   AF (paroxysmal atrial fibrillation) (HCC)    Allergy    Anxiety    Arthritis    BCC (basal cell carcinoma) 03/11/2003   left distal lower leg ankle-tx dr helga   Biventricular cardiac pacemaker in situ    a. prior CRT-D in 2013 with downgrade to CRT-Pacemaker only in 11/2016.   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 2012: normal coronaries, EF 25-30%. b. improved to 55% 11/2016.   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   Past Surgical History:  Procedure Laterality Date   ARTERY BIOPSY  12/29/2011   Procedure: MINOR BIOPSY TEMPORAL ARTERY;  Surgeon: Morene ONEIDA Olives, MD;  Location: [facility];  Service: General;  Laterality: Right;  Right temporal artery biopsy   BACK SURGERY  12/31/2009   for flat back syndrome; fused bottom of my back   BI-VENTRICULAR IMPLANTABLE CARDIOVERTER DEFIBRILLATOR Left 01/20/2011   Procedure: BI-VENTRICULAR IMPLANTABLE CARDIOVERTER DEFIBRILLATOR  (CRT-D);  Surgeon: Danelle LELON Birmingham, MD;  Location: [facility] CATH LAB;  Service: Cardiovascular;  Laterality: Left;   BIV ICD GENERATOR CHANGEOUT N/A 11/24/2016   Procedure: BIV ICD GENERATOR CHANGEOUT;  Surgeon: Birmingham Danelle LELON, MD;  Location: [facility] INVASIVE CV LAB;  Service: Cardiovascular;  Laterality: N/A;   BREAST LUMPECTOMY WITH RADIOACTIVE SEED LOCALIZATION Left 01/22/2020   Procedure: LEFT BREAST LUMPECTOMY WITH RADIOACTIVE SEED LOCALIZATION;  Surgeon: Belinda Cough, MD;  Location: [facility];  Service: General;  Laterality: Left;  LMA   BREAST SURGERY     CERVICAL ABLATION  03/29/2023   CHOLECYSTECTOMY  ~1996   FOOT NEUROMA SURGERY  ~ 2003   right   RE-EXCISION  OF BREAST LUMPECTOMY Left 03/03/2020   Procedure: RE-EXCISION MARGINS OF LEFT BREAST LUMPECTOMY;  Surgeon: Belinda Cough, MD;  Location: [facility];  Service: General;  Laterality: Left;   ROTATOR CUFF REPAIR  ~ 2009   left   SHOULDER ARTHROSCOPY W/ ROTATOR CUFF REPAIR Right 09/21/2011   SKIN CANCER EXCISION     nose, forehead, left leg   SPINE SURGERY     TEE WITHOUT CARDIOVERSION N/A 03/01/2017   Procedure: TRANSESOPHAGEAL ECHOCARDIOGRAM (TEE);  Surgeon: Raford Riggs, MD;  Location: [facility] ENDOSCOPY;  Service: Cardiovascular;  Laterality: N/A;   THYROID  LOBECTOMY  ~ 2006   right   TUBAL LIGATION  ~ 1983    VAGINAL HYSTERECTOMY  ~ 2009   VESICOVAGINAL FISTULA CLOSURE W/ TAH     Patient Active Problem List   Diagnosis Date Noted   Iron deficiency 11/24/2023   Physical deconditioning 11/24/2023   Hematoma 11/14/2023   Benign essential tremor 07/10/2023   Mood disorder 07/10/2023   Intractable chronic migraine without aura 07/10/2023   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   Restless legs syndrome 07/10/2023   Age-related osteoporosis without current pathological fracture 11/29/2020   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   Biventricular cardiac pacemaker in situ 10/08/2018   Long term current use of anticoagulant therapy 10/23/2017   AICD (automatic cardioverter/defibrillator) present    Aortic regurgitation    Nonischemic cardiomyopathy (HCC) 01/21/2011   Idiopathic peripheral neuropathy 08/24/2010   Paroxysmal atrial fibrillation (HCC) 02/23/2010   Raynaud's phenomenon 02/15/2007    PCP: Jerrell Cleatus Ned, MD  REFERRING PROVIDER: Jerrell Cleatus Ned, MD  REFERRING DIAG: [phone] (ICD-10-CM) - Hematoma of right flank, initial encounter G25.81 (ICD-10-CM) - Restless legs syndrome  Rationale for Evaluation and Treatment: Rehabilitation  THERAPY DIAG:  Muscle weakness (generalized)  Abnormal posture  Difficulty in walking, not elsewhere classified  Pain in both upper extremities  ONSET DATE: 11/13/23  SUBJECTIVE:                                                                                                                                                                                           SUBJECTIVE STATEMENT: Patient referred for physical therapy evaluation status post hospital admission for a fall.  During that time  just recently I have started walking the dog with the cane its a challenge. I'm not standing and walking much. I need to get out of that recliner Previous to this fall she reports she was more mobile but not necessarily  active and exercising.  She is gradually increasing her activity in the home and reports she Did 1.5 mile the other  day with the dog (9 lb) Arms are sore bilaterally, not sure why (cane?) she did sprain the elbow.  Her arms are weak.  She does have neck and back issues, takes meds and was prior to the fall.  PERTINENT HISTORY:  Restless leg syndrome Spine surgery A fib Pacemaker  PAIN:  Are you having pain? Yes: NPRS scale: 3/10 Pain location: bilateral shoulders  Pain description: aching  Aggravating factors: early in AM, moving over in bed,  Relieving factors: as the day goes on  Yes: NPRS scale: 3/10 Pain location: low back Rt side  Pain description: sore  Aggravating factors: walking up a hill increases pain in back Relieving factors: MHP, medicine- Tylenol    PRECAUTIONS: Fall and ICD/Pacemaker  RED FLAGS: None    WEIGHT BEARING RESTRICTIONS: No  FALLS:  Has patient fallen in last 6 months? Yes. Number of falls 1- was bending   LIVING ENVIRONMENT: Lives with: lives with their spouse Lives in: House/apartment Stairs: Yes: External: 2 steps; can reach both Has following equipment at home: Single point cane and Walker - 2 wheeled rollator is on the way   OCCUPATION: retired   PLOF: Independent with basic ADLs, Independent with household mobility with device, Independent with community mobility with device, Requires assistive device for independence, and Leisure: watching TV, be together   PATIENT GOALS: I want to be rejuvenated and get stronger.   NEXT MD VISIT: upcoming -[facility]   OBJECTIVE:  Note: Objective measures were completed at Evaluation unless otherwise noted.  DIAGNOSTIC FINDINGS:  Imaging: CT head without contrast no acute intracranial normality.  CT cervical spine no acute fracture or traumatic malalignment.  There are chronically opacified in the spine the right posterior ethmoid air cells and the sphenoid sinus, suggesting mucocele.  CT of the  thoracic/lumbosacral spine showing postoperative thoracolumbar to sacrum and iliac hardware without CT evidence of acute complication.  Diffusely decreased bone density.  Right medial soft tissue acute hematoma measuring up to 13 x 6 cm.  No mesenteric or mediastinal hematoma.  PATIENT SURVEYS:  ABC scale: The Activities-Specific Balance Confidence (ABC) Scale TBA   COGNITION: Overall cognitive status: Within functional limits for tasks assessed     SENSATION: WFL  MUSCLE LENGTH: Hamstrings: 45-55 deg PROM   POSTURE: rounded shoulders, forward head, decreased lumbar lordosis, and genu valgus L> R   PALPATION: NT   LUMBAR ROM:   AROM eval  Flexion NT   Extension 75%   Right lateral flexion 90%  Left lateral flexion 90%  Right rotation 50%  Left rotation 50%   (Blank rows = not tested)  LOWER EXTREMITY ROM:   WNL   Passive  Right eval Left eval  Hip flexion    Hip extension    Hip abduction    Hip adduction    Hip internal rotation    Hip external rotation    Knee flexion    Knee extension    Ankle dorsiflexion    Ankle plantarflexion    Ankle inversion    Ankle eversion     (Blank rows = not tested)  LOWER EXTREMITY MMT:    MMT Right eval Left eval  Hip flexion 4+ 4+  Hip extension 4- 4-  Hip abduction    Hip adduction    Hip internal rotation    Hip external rotation    Knee flexion 4+ 4+  Knee extension 4+ 4+  Ankle dorsiflexion 4 4+  Ankle plantarflexion    Ankle inversion  Ankle eversion     (Blank rows = not tested  12/05/23: UE 4-/5 Limited shoulder flexion to 105 deg -110 deg bilateral   LUMBAR SPECIAL TESTS:  NT   FUNCTIONAL TESTS:  5 times sit to stand: 16 sec no UEs  2 minute walk test: 375 feet   GAIT: Distance walked: 375  Assistive device utilized: Single point cane Level of assistance: Complete Independence Comments: stiffness noted in trunk, good ability to increase gait speed.   TREATMENT DATE:   [facility] Adult PT  Treatment:                                                DATE: 12/05/23 Therapeutic Activity: 2 min walk 5 x STS  Trial reps:  Bridge  LTR with head turns  Row Sit to stand  Self Care: HEP, technique and rationale     Causes of shoulder pain (neck vs posture, activity)                                                                                                                                PATIENT EDUCATION:  Education details: Self-care see above Person educated: Patient Education method: Programmer, Multimedia, Demonstration, Verbal cues, and Handouts Education comprehension: verbalized understanding and needs further education  HOME EXERCISE PROGRAM: Access Code: [email or web address] URL: [email or web address] Date: 12/05/2023 Prepared by: Delon Norma  Exercises - Supine Lower Trunk Rotation  - 1 x daily - 7 x weekly - 2 sets - 10 reps - 5-10 hold - Supine Bridge  - 1 x daily - 7 x weekly - 2 sets - 10 reps - 5 hold - Standing Row with Anchored Resistance  - 1 x daily - 7 x weekly - 2 sets - 10 reps - 5 hold - Sit to Stand with Arm Reach Toward Target  - 1 x daily - 7 x weekly - 2 sets - 10 reps  ASSESSMENT:  CLINICAL IMPRESSION: Patient is a 68y.o. female who was seen today for physical therapy evaluation and treatment for generalized weakness and deconditioning status post fall with hematoma.   OBJECTIVE IMPAIRMENTS: Abnormal gait, decreased activity tolerance, decreased balance, decreased endurance, decreased mobility, difficulty walking, decreased ROM, decreased strength, increased fascial restrictions, impaired flexibility, impaired UE functional use, improper body mechanics, postural dysfunction, and pain.   ACTIVITY LIMITATIONS: carrying, lifting, bending, standing, squatting, sleeping, stairs, transfers, bed mobility, reach over head, locomotion level, and caring for others  PARTICIPATION LIMITATIONS: meal prep, cleaning, laundry, interpersonal relationship,  shopping, community activity, and yard work  PERSONAL FACTORS: 3+ comorbidities: Atrial fibrillation spinal fusion osteoporosis are also affecting patient's functional outcome.   REHAB POTENTIAL: Excellent  CLINICAL DECISION MAKING: Stable/uncomplicated  EVALUATION COMPLEXITY: Low   GOALS: Goals reviewed with patient? Yes  SHORT TERM GOALS: Target date: 01/02/2024   Patient  will be able to show independence for initial HEP to include posture, core and hip strength and stability.   Baseline: Goal status: INITIAL  2.  Patient will note improved endurance for standing activities up to 15 minutes in her home involving upper body (laundry, cooking)  Baseline:  Goal status: INITIAL  3.  Patient will complete ABC balance assessment and goal set  Baseline:  Goal status: INITIAL   LONG TERM GOALS: Target date: 01/30/2024    Patient will be independent with final HEP upon discharge from PT and report consistent benefit following exercise completion.    Baseline:  Goal status: INITIAL  2.  Patient will improve 5 x STS to 13 sec or less to demo decreased fall risk.   Baseline: 16 sec Goal status: INITIAL  3.  Patient will be able to improve 2-minute walk distance by 50 feet to show improve endurance and gait stability Baseline:  Goal status: INITIAL  4.  Patient will be able to properly and safely perform hip hinge in order to care for her dogs Baseline:  Goal status: INITIAL  5.  ABC score balance confidence to be assessed Baseline:  Goal status: INITIAL  6.  Patient will be able to complete floor transfer mod independent to reduce burden of care Baseline:  Goal status: INITIAL  PLAN:  PT FREQUENCY: 2x/week  PT DURATION: 8 weeks  PLANNED INTERVENTIONS: [id number]- PT Re-evaluation, 97750- Physical Performance Testing, 97110-Therapeutic exercises, 97530- Therapeutic activity, [id number]- Neuromuscular re-education, 97535- Self Care, [id number]- Manual therapy, [phone]- Gait training,  Patient/Family education, Balance training, Stair training, Cryotherapy, and Moist heat.  PLAN FOR NEXT SESSION: Check home exercise program complete more dynamic balance screen consider recumbent bike standing/wall exercises   Clint Biello, PT 12/05/2023, 1:31 PM

## 2023-12-14 ENCOUNTER — Encounter: Payer: Self-pay | Admitting: Physical Therapy

## 2023-12-14 ENCOUNTER — Other Ambulatory Visit (HOSPITAL_BASED_OUTPATIENT_CLINIC_OR_DEPARTMENT_OTHER): Payer: Self-pay | Admitting: Family Medicine

## 2023-12-14 ENCOUNTER — Ambulatory Visit: Admitting: Physician Assistant

## 2023-12-14 ENCOUNTER — Ambulatory Visit (INDEPENDENT_AMBULATORY_CARE_PROVIDER_SITE_OTHER): Admitting: Physical Therapy

## 2023-12-14 DIAGNOSIS — M810 Age-related osteoporosis without current pathological fracture: Secondary | ICD-10-CM | POA: Diagnosis not present

## 2023-12-14 DIAGNOSIS — M79601 Pain in right arm: Secondary | ICD-10-CM | POA: Diagnosis not present

## 2023-12-14 DIAGNOSIS — G43709 Chronic migraine without aura, not intractable, without status migrainosus: Secondary | ICD-10-CM

## 2023-12-14 DIAGNOSIS — R262 Difficulty in walking, not elsewhere classified: Secondary | ICD-10-CM

## 2023-12-14 DIAGNOSIS — M6281 Muscle weakness (generalized): Secondary | ICD-10-CM

## 2023-12-14 DIAGNOSIS — R293 Abnormal posture: Secondary | ICD-10-CM

## 2023-12-14 DIAGNOSIS — M79602 Pain in left arm: Secondary | ICD-10-CM

## 2023-12-14 MED ORDER — ROMOSOZUMAB-AQQG 105 MG/1.17ML ~~LOC~~ SOSY
210.0000 mg | PREFILLED_SYRINGE | SUBCUTANEOUS | Status: AC
  Administered 2023-12-14: 210 mg via SUBCUTANEOUS

## 2023-12-14 NOTE — Telephone Encounter (Signed)
 Copied from CRM [id number]. Topic: Clinical - Medication Refill >> Dec 14, 2023 11:08 AM Nessti S wrote: Medication: eletriptan  (RELPAX ) 40 MG tablet   Has the patient contacted their pharmacy? Yes (Agent: If no, request that the patient contact the pharmacy for the refill. If patient does not wish to contact the pharmacy document the reason why and proceed with request.) (Agent: If yes, when and what did the pharmacy advise?)  This is the patient's preferred pharmacy:  [id number] - [place], [place] - [address] [place] [address] Phone: [phone] Fax: [phone]  Is this the correct pharmacy for this prescription? Yes If no, delete pharmacy and type the correct one.   Has the prescription been filled recently? No  Is the patient out of the medication? Yes  Has the patient been seen for an appointment in the last year OR does the patient have an upcoming appointment? Yes  Can we respond through MyChart? Yes  Agent: Please be advised that Rx refills may take up to 3 business days. We ask that you follow-up with your pharmacy.

## 2023-12-14 NOTE — Progress Notes (Signed)
 Office Visit Note   Patient: [name]           Date of Birth: Jul 28, 1955           MRN: [id number] Visit Date: 12/14/2023              Requested by: Jerrell Cleatus Ned, MD [address] [place],  [place] [address] PCP: Jerrell Cleatus Ned, MD  Chief Complaint  Patient presents with   Osteoporosis    EVENITY  INJ      HPI: Patient presents today for her 6 Evenity  injection.  Tolerated the well.  Assessment & Plan: Visit Diagnoses:  1. Age-related osteoporosis without current pathological fracture     Plan: She did have her calcium was 8.4 would like to draw another 1 today follow-up in 31 days  Follow-Up Instructions: Return in about 1 month (around 01/14/2024).     Patient is alert, oriented, no adenopathy, well-dressed, normal affect, normal respiratory effort.     Imaging: No results found. No images are attached to the encounter.  Labs: Lab Results  Component Value Date   ESRSEDRATE 4 01/09/2023   ESRSEDRATE 18 04/07/2021   ESRSEDRATE 55 (H) 02/12/2010   CRP 6 01/09/2023   REPTSTATUS 03/02/2017 FINAL 02/25/2017   GRAMSTAIN  02/09/2010    RARE WBC PRESENT,BOTH PMN AND MONONUCLEAR NO ORGANISMS SEEN   CULT  02/25/2017    NO GROWTH 5 DAYS Performed at [facility], [address] [place], [place] [address]      Lab Results  Component Value Date   ALBUMIN  3.3 (L) 11/15/2023   ALBUMIN  4.1 11/13/2023   ALBUMIN  4.0 02/04/2021    Lab Results  Component Value Date   MG 1.7 02/12/2010   MG 1.6 02/11/2010   MG 2.0 02/09/2010   Lab Results  Component Value Date   VD25OH CANCELED 04/24/2023    No results found for: PREALBUMIN    Latest Ref Rng & Units 11/24/2023   12:15 PM 11/16/2023    5:47 AM 11/15/2023    5:30 AM  CBC EXTENDED  WBC 4.0 - 10.5 K/uL 9.4  7.5  6.6   RBC 3.87 - 5.11 Mil/uL 3.96  3.16  3.33   Hemoglobin 12.0 - 15.0 g/dL 87.7  9.5  89.9   HCT 63.9 - 46.0 % 36.6  29.2  31.0   Platelets 150.0 - 400.0 K/uL  441.0  226  156      There is no height or weight on file to calculate BMI.  Orders:  Orders Placed This Encounter  Procedures   Calcium   No orders of the defined types were placed in this encounter.    Procedures: No procedures performed  Clinical Data: No additional findings.  ROS:  All other systems negative, except as noted in the HPI. Review of Systems  Objective: Vital Signs: There were no vitals taken for this visit.  Specialty Comments:  No specialty comments available.  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   Biventricular cardiac pacemaker in situ 10/08/2018   Long term current use of  anticoagulant therapy 10/23/2017   AICD (automatic cardioverter/defibrillator) present    Aortic regurgitation    Nonischemic cardiomyopathy (HCC) 01/21/2011   Idiopathic peripheral neuropathy 08/24/2010   Paroxysmal atrial fibrillation (HCC) 02/23/2010   Raynaud's phenomenon 02/15/2007   Past Medical History:  Diagnosis Date   AF (paroxysmal atrial fibrillation) (HCC)    Allergy    Anxiety    Arthritis    BCC (basal cell carcinoma) 03/11/2003   left distal lower leg ankle-tx dr helga   Biventricular cardiac pacemaker in situ    a. prior CRT-D in 2013 with downgrade to CRT-Pacemaker only in 11/2016.   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 2012: normal coronaries, EF 25-30%. b. improved to 55% 11/2016.   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    Family History  Problem Relation Age of Onset   Heart disease Mother    Heart failure Mother 58       CHF   Stroke Mother    Hypertension Mother    Varicose Veins Mother    Heart disease Father 68   Macular degeneration Father    Hearing loss Father    Vision loss Father    Heart attack Sister    Depression Sister    Heart disease Sister    Varicose Veins Sister    Atrial fibrillation Sister    Heart disease Sister    Arthritis Sister     Past Surgical History:  Procedure Laterality Date   ARTERY BIOPSY  12/29/2011   Procedure: MINOR BIOPSY TEMPORAL ARTERY;  Surgeon: Morene ONEIDA Olives, MD;  Location: [facility];  Service: General;  Laterality: Right;  Right temporal artery biopsy   BACK SURGERY  12/31/2009   for flat back syndrome; fused bottom of my back   BI-VENTRICULAR IMPLANTABLE CARDIOVERTER DEFIBRILLATOR Left 01/20/2011   Procedure: BI-VENTRICULAR IMPLANTABLE CARDIOVERTER DEFIBRILLATOR  (CRT-D);  Surgeon: Danelle LELON Birmingham, MD;  Location: [facility] CATH LAB;  Service:  Cardiovascular;  Laterality: Left;   BIV ICD GENERATOR CHANGEOUT N/A 11/24/2016   Procedure: BIV ICD GENERATOR CHANGEOUT;  Surgeon: Birmingham Danelle LELON, MD;  Location: [facility] INVASIVE CV LAB;  Service: Cardiovascular;  Laterality: N/A;   BREAST LUMPECTOMY WITH RADIOACTIVE SEED LOCALIZATION Left 01/22/2020   Procedure: LEFT BREAST LUMPECTOMY WITH RADIOACTIVE SEED LOCALIZATION;  Surgeon: Belinda Cough, MD;  Location: [facility];  Service: General;  Laterality: Left;  LMA  BREAST SURGERY     CERVICAL ABLATION  03/29/2023   CHOLECYSTECTOMY  ~1996   FOOT NEUROMA SURGERY  ~ 2003   right   RE-EXCISION OF BREAST LUMPECTOMY Left 03/03/2020   Procedure: RE-EXCISION MARGINS OF LEFT BREAST LUMPECTOMY;  Surgeon: Belinda Cough, MD;  Location: [facility];  Service: General;  Laterality: Left;   ROTATOR CUFF REPAIR  ~ 2009   left   SHOULDER ARTHROSCOPY W/ ROTATOR CUFF REPAIR Right 09/21/2011   SKIN CANCER EXCISION     nose, forehead, left leg   SPINE SURGERY     TEE WITHOUT CARDIOVERSION N/A 03/01/2017   Procedure: TRANSESOPHAGEAL ECHOCARDIOGRAM (TEE);  Surgeon: Raford Riggs, MD;  Location: [facility] ENDOSCOPY;  Service: Cardiovascular;  Laterality: N/A;   THYROID  LOBECTOMY  ~ 2006   right   TUBAL LIGATION  ~ 1983   VAGINAL HYSTERECTOMY  ~ 2009   VESICOVAGINAL FISTULA CLOSURE W/ TAH     Social History   Occupational History   Occupation: Runner, Broadcasting/film/video    Comment: n/a   Occupation: RETIRED  Tobacco Use   Smoking status: Never    Passive exposure: Never   Smokeless tobacco: Never  Vaping Use   Vaping status: Never Used  Substance and Sexual Activity   Alcohol  use: No   Drug use: No   Sexual activity: Yes

## 2023-12-14 NOTE — Therapy (Signed)
 OUTPATIENT PHYSICAL THERAPY NOTE   Patient Name: [name] MRN: [id number] DOB:1955/02/12, 68 y.o., female Today's Date: 12/14/2023  END OF SESSION:  PT End of Session - 12/14/23 0928     Visit Number 2    Number of Visits 16    Date for Recertification  01/30/24    Authorization Type [employer] MCR    PT Start Time 0930    PT Stop Time 1017    PT Time Calculation (min) 47 min    Equipment Utilized During Treatment Gait belt    Activity Tolerance Patient tolerated treatment well    Behavior During Therapy WFL for tasks assessed/performed           Past Medical History:  Diagnosis Date   AF (paroxysmal atrial fibrillation) (HCC)    Allergy    Anxiety    Arthritis    BCC (basal cell carcinoma) 03/11/2003   left distal lower leg ankle-tx dr helga   Biventricular cardiac pacemaker in situ    a. prior CRT-D in 2013 with downgrade to CRT-Pacemaker only in 11/2016.   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 2012: normal coronaries, EF 25-30%. b. improved to 55% 11/2016.   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   Past Surgical History:  Procedure Laterality Date   ARTERY BIOPSY  12/29/2011   Procedure: MINOR BIOPSY TEMPORAL ARTERY;  Surgeon: Morene ONEIDA Olives, MD;  Location: [facility];  Service: General;  Laterality: Right;  Right temporal artery biopsy   BACK SURGERY  12/31/2009   for flat back syndrome; fused bottom of my back   BI-VENTRICULAR IMPLANTABLE CARDIOVERTER DEFIBRILLATOR Left 01/20/2011   Procedure: BI-VENTRICULAR IMPLANTABLE CARDIOVERTER DEFIBRILLATOR  (CRT-D);  Surgeon: Danelle LELON Birmingham, MD;  Location: [facility] CATH LAB;  Service: Cardiovascular;  Laterality: Left;   BIV ICD GENERATOR CHANGEOUT N/A 11/24/2016   Procedure: BIV ICD GENERATOR CHANGEOUT;  Surgeon: Birmingham Danelle LELON, MD;  Location: [facility] INVASIVE CV LAB;  Service: Cardiovascular;  Laterality: N/A;   BREAST LUMPECTOMY WITH RADIOACTIVE SEED LOCALIZATION Left 01/22/2020   Procedure: LEFT BREAST LUMPECTOMY WITH RADIOACTIVE SEED LOCALIZATION;  Surgeon: Belinda Cough, MD;  Location: [facility];  Service: General;  Laterality: Left;  LMA   BREAST SURGERY     CERVICAL ABLATION  03/29/2023   CHOLECYSTECTOMY  ~1996   FOOT NEUROMA SURGERY  ~ 2003   right   RE-EXCISION  OF BREAST LUMPECTOMY Left 03/03/2020   Procedure: RE-EXCISION MARGINS OF LEFT BREAST LUMPECTOMY;  Surgeon: Belinda Cough, MD;  Location: [facility];  Service: General;  Laterality: Left;   ROTATOR CUFF REPAIR  ~ 2009   left   SHOULDER ARTHROSCOPY W/ ROTATOR CUFF REPAIR Right 09/21/2011   SKIN CANCER EXCISION     nose, forehead, left leg   SPINE SURGERY     TEE WITHOUT CARDIOVERSION N/A 03/01/2017   Procedure: TRANSESOPHAGEAL ECHOCARDIOGRAM (TEE);  Surgeon: Raford Riggs, MD;  Location: [facility] ENDOSCOPY;  Service: Cardiovascular;  Laterality: N/A;   THYROID  LOBECTOMY  ~ 2006   right   TUBAL LIGATION  ~ 1983   VAGINAL  HYSTERECTOMY  ~ 2009   VESICOVAGINAL FISTULA CLOSURE W/ TAH     Patient Active Problem List   Diagnosis Date Noted   Iron deficiency 11/24/2023   Physical deconditioning 11/24/2023   Hematoma 11/14/2023   Benign essential tremor 07/10/2023   Mood disorder 07/10/2023   Intractable chronic migraine without aura 07/10/2023   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   Restless legs syndrome 07/10/2023   Age-related osteoporosis without current pathological fracture 11/29/2020   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   Biventricular cardiac pacemaker in situ 10/08/2018   Long term current use of anticoagulant therapy 10/23/2017   AICD (automatic cardioverter/defibrillator) present    Aortic regurgitation    Nonischemic cardiomyopathy (HCC) 01/21/2011   Idiopathic peripheral neuropathy 08/24/2010   Paroxysmal atrial fibrillation (HCC) 02/23/2010   Raynaud's phenomenon 02/15/2007    PCP: Jerrell Cleatus Ned, MD  REFERRING PROVIDER: Jerrell Cleatus Ned, MD  REFERRING DIAG: [id number] (ICD-10-CM) - Hematoma of right flank, initial encounter G25.81 (ICD-10-CM) - Restless legs syndrome  Rationale for Evaluation and Treatment: Rehabilitation  THERAPY DIAG:  Muscle weakness (generalized)  Abnormal posture  Difficulty in walking, not elsewhere classified  Pain in both upper extremities  ONSET DATE: 11/13/23  SUBJECTIVE:                                                                                                                                                                                           SUBJECTIVE STATEMENT: I have had a tough time with my arthritis after last time.  I have had a lot of arthritis pain since then.  Its me, not what we did.  Took Tylenol  arthritis today.   just recently I have started walking the dog with the cane its a challenge. I'm not standing and walking much. I need to get out of that recliner Previous to this fall she  reports she was more mobile but not necessarily active and  exercising.  She is gradually increasing her activity in the home and reports she Did 1.5 mile the other day with the dog (9 lb) Arms are sore bilaterally, not sure why (cane?) she did sprain the elbow.  Her arms are weak.  She does have neck and back issues, takes meds and was prior to the fall.  PERTINENT HISTORY:  Restless leg syndrome Spine surgery A fib Pacemaker  PAIN:  Are you having pain? Yes: NPRS scale: 2/10 Pain location: bilateral shoulders  Pain description: aching  Aggravating factors: early in AM, moving over in bed,  Relieving factors: as the day goes on Min in knees and neck.     Yes: NPRS scale: none /10 Pain location: low back Rt side  Pain description: sore  Aggravating factors: walking up a hill increases pain in back Relieving factors: MHP, medicine- Tylenol    PRECAUTIONS: Fall and ICD/Pacemaker  RED FLAGS: None    WEIGHT BEARING RESTRICTIONS: No  FALLS:  Has patient fallen in last 6 months? Yes. Number of falls 1- was bending   LIVING ENVIRONMENT: Lives with: lives with their spouse Lives in: House/apartment Stairs: Yes: External: 2 steps; can reach both Has following equipment at home: Single point cane and Walker - 2 wheeled rollator is on the way   OCCUPATION: retired   PLOF: Independent with basic ADLs, Independent with household mobility with device, Independent with community mobility with device, Requires assistive device for independence, and Leisure: watching TV, be together   PATIENT GOALS: I want to be rejuvenated and get stronger.   NEXT MD VISIT: upcoming -[facility]   OBJECTIVE:  Note: Objective measures were completed at Evaluation unless otherwise noted.  DIAGNOSTIC FINDINGS:  Imaging: CT head without contrast no acute intracranial normality.  CT cervical spine no acute fracture or traumatic malalignment.  There are chronically opacified in the spine the right  posterior ethmoid air cells and the sphenoid sinus, suggesting mucocele.  CT of the thoracic/lumbosacral spine showing postoperative thoracolumbar to sacrum and iliac hardware without CT evidence of acute complication.  Diffusely decreased bone density.  Right medial soft tissue acute hematoma measuring up to 13 x 6 cm.  No mesenteric or mediastinal hematoma.  PATIENT SURVEYS:  ABC scale: The Activities-Specific Balance Confidence (ABC) Scale TBA   COGNITION: Overall cognitive status: Within functional limits for tasks assessed     SENSATION: WFL  MUSCLE LENGTH: Hamstrings: 45-55 deg PROM   POSTURE: rounded shoulders, forward head, decreased lumbar lordosis, and genu valgus L> R   PALPATION: NT   LUMBAR ROM:   AROM eval  Flexion NT   Extension 75%   Right lateral flexion 90%  Left lateral flexion 90%  Right rotation 50%  Left rotation 50%   (Blank rows = not tested)  LOWER EXTREMITY ROM:   WNL   Passive  Right eval Left eval  Hip flexion    Hip extension    Hip abduction    Hip adduction    Hip internal rotation    Hip external rotation    Knee flexion    Knee extension    Ankle dorsiflexion    Ankle plantarflexion    Ankle inversion    Ankle eversion     (Blank rows = not tested)  LOWER EXTREMITY MMT:    MMT Right eval Left eval  Hip flexion 4+ 4+  Hip extension 4- 4-  Hip abduction    Hip adduction    Hip internal rotation    Hip external  rotation    Knee flexion 4+ 4+  Knee extension 4+ 4+  Ankle dorsiflexion 4 4+  Ankle plantarflexion    Ankle inversion    Ankle eversion     (Blank rows = not tested  12/05/23: UE 4-/5 Limited shoulder flexion to 105 deg -110 deg bilateral   LUMBAR SPECIAL TESTS:  NT   FUNCTIONAL TESTS:  5 times sit to stand: 16 sec no UEs  2 minute walk test: 375 feet   GAIT: Distance walked: 375  Assistive device utilized: Single point cane Level of assistance: Complete Independence Comments: stiffness noted in  trunk, good ability to increase gait speed.   TREATMENT DATE:  [facility] Adult PT Treatment:                                                DATE: 12/14/23 Neuromuscular re-ed: Balance on Airex cervical rotation, flexion/extension min A/close S Therapeutic Activity: Sit to stand x 10  Hip abduction on airex for strength and balance  Row and extension red band 15 reps  Step taps no UEs  Heel raises  x 20  Seated yellow ER x 15 Seated horizontal abd yellow x 10    BERG BALANCE Sitting to Standing: Numbers; 0-4: 4  4. Stands without using hands and stabilize independently  3. Stands independently using hands  2. Stands using hands after multiple trials  1. Min A to stand  0. Mod-Max A to stand Standing unsupported: Numbers; 0-4: 4  4. Stands safely for 2 minutes  3. Stands 2 minutes with supervision  2. Stands 30 seconds unsupported  1. Needs several tries to stand unsupported for 30 seconds  0. Unable to stand unsupported for 30 seconds Sitting unsupported: Numbers; 0-4: 4  4. Sits for 2 minutes independently  3. Sits for 2 minutes with supervision  2. Able to sit 30 seconds  1. Able to sit 10 seconds  0. Unable to sit for 10 seconds Standing to Sitting: Numbers; 0-4: 4 4. Sits safely with minimal use of hands 3. Controls descent with hands 2. Uses back of legs against chair to control descent 1. Sits independently, but uncontrolled descent 0. Needs assistance Transfers: Numbers; 0-4: 4  4. Transfers safely with minor use of hands  3. Transfers safely definite use of hands  2. Transfers with verbal cueing/supervision  1. Needs 1 person assist  0. Needs 2 person assist  Standing with eyes closed: Numbers; 0-4: 4  4. Stands safely for 10 seconds  3. Stands 10 seconds with supervision   2. Able to stand for 3 seconds  1. Unable to keep eyes closed for 3 seconds, but is safe  0. Needs assist to keep from falling Standing with feet together: Numbers; 0-4: 4 4. Stands for 1  minute safely 3. Stands for 1 minute with supervision 2. Unable to hold for 30 seconds  1. Needs help to attain position but can hold for 15 seconds  0. Needs help to attain position and unable to hold for 15 seconds Reaching forward with outstretched arm: Numbers; 0-4: 3  4. Reaches forward 10 inches  3. Reaches forward 5 inches  2. Reaches forward 2 inches  1. Reaches forward with supervision  0. Loses balance/requires assistance Retrieving object from the floor: Numbers; 0-4: 3 4. Able to pick up easily and safely 3. Able to  pick up with supervision 2. Unable to pick up, but reaches within 1-2 inches independently 1. Unable to pick up and needs supervision 0. Unable/needs assistance to keep from falling  Turning to look behind: Numbers; 0-4: 3  4. Looks behind from both sides and weight shifts well  3. Looks behind one side only, other side less weight shift  2. Turns sideways only, maintains balance  1. Needs supervision when turning  0. Needs assistance  Turning 360 degrees: Numbers; 0-4: 2  4. Able to turn in </=4 seconds  3. Able to turn on one side in </= 4 seconds   2. Able to turn slowly, but safely  1. Needs supervision or verbal cueing  0. Needs assistance Place alternate foot on stool: Numbers; 0-4: 4 4. Completes 8 steps in 20 seconds 3. Completes 8 steps in >20 seconds 2. 4 steps without assistance/supervision 1. Completes >2 steps with minimal assist 0. Unable, needs assist to keep from falling Standing with one foot in front: Numbers; 0-4: 3  4. Independent tandem for 30 seconds  3. Independent foot ahead for 30 seconds  2. Independent small step for 30 seconds  1. Needs help to step, but can hold for 15 seconds  0. Loses balance while standing/stepping Standing on one foot: Numbers; 0-4: 4 on Lt LE, Rt < 10 sec  4. Holds >10 seconds 3. Holds 5-10 seconds 2. Holds >/=3 seconds  1. Holds <3 seconds 0. Unable   Total Score: 50/56   [facility] Adult PT  Treatment:                                                DATE: 12/05/23 Therapeutic Activity: 2 min walk 5 x STS  Trial reps:  Bridge  LTR with head turns  Row Sit to stand  Self Care: HEP, technique and rationale     Causes of shoulder pain (neck vs posture, activity)                                                                                                                                PATIENT EDUCATION:  Education details: Self-care see above Person educated: Patient Education method: Programmer, Multimedia, Demonstration, Verbal cues, and Handouts Education comprehension: verbalized understanding and needs further education  HOME EXERCISE PROGRAM: Access Code: [email or web address] URL: [email or web address] Date: 12/05/2023 Prepared by: Delon Norma  Exercises - Supine Lower Trunk Rotation  - 1 x daily - 7 x weekly - 2 sets - 10 reps - 5-10 hold - Supine Bridge  - 1 x daily - 7 x weekly - 2 sets - 10 reps - 5 hold - Standing Row with Anchored Resistance  - 1 x daily - 7 x weekly - 2 sets - 10 reps - 5 hold - Sit to Stand with Arm Reach  Toward Target  - 1 x daily - 7 x weekly - 2 sets - 10 reps  ASSESSMENT:  CLINICAL IMPRESSION: Patient reports a great amount of pain after her last session but unclear what may have caused it.  She does deal with quite a bit of neck and shoulder pain and mentioned that she may even need to have spinal surgery in the future.  She was agreeable to exercise today despite the pain and chose to work in a more upright position to see if that could help.  She scored a 50 /56 on her Berg balance test indicating a slight fall risk.  She does move very slowly which made her score little bit lower and this is because she does have a fear of falling which is understandable.  No increase in pain following the session.   OBJECTIVE IMPAIRMENTS: Abnormal gait, decreased activity tolerance, decreased balance, decreased endurance, decreased mobility, difficulty  walking, decreased ROM, decreased strength, increased fascial restrictions, impaired flexibility, impaired UE functional use, improper body mechanics, postural dysfunction, and pain.   ACTIVITY LIMITATIONS: carrying, lifting, bending, standing, squatting, sleeping, stairs, transfers, bed mobility, reach over head, locomotion level, and caring for others  PARTICIPATION LIMITATIONS: meal prep, cleaning, laundry, interpersonal relationship, shopping, community activity, and yard work  PERSONAL FACTORS: 3+ comorbidities: Atrial fibrillation spinal fusion osteoporosis are also affecting patient's functional outcome.   REHAB POTENTIAL: Excellent  CLINICAL DECISION MAKING: Stable/uncomplicated  EVALUATION COMPLEXITY: Low   GOALS: Goals reviewed with patient? Yes  SHORT TERM GOALS: Target date: 01/02/2024   Patient will be able to show independence for initial HEP to include posture, core and hip strength and stability.   Baseline: Goal status: Ongoing 2.  Patient will note improved endurance for standing activities up to 15 minutes in her home involving upper body (laundry, cooking)  Baseline:  Goal status: Ongoing 3.  Patient will complete ABC balance assessment and goal set  Baseline:  Goal status: INITIAL   LONG TERM GOALS: Target date: 01/30/2024    Patient will be independent with final HEP upon discharge from PT and report consistent benefit following exercise completion.    Baseline:  Goal status: INITIAL  2.  Patient will improve 5 x STS to 13 sec or less to demo decreased fall risk.   Baseline: 16 sec Goal status: INITIAL  3.  Patient will be able to improve 2-minute walk distance by 50 feet to show improve endurance and gait stability Baseline:  Goal status: INITIAL  4.  Patient will be able to properly and safely perform hip hinge in order to care for her dogs Baseline:  Goal status: INITIAL  5.  ABC score balance confidence to be assessed Baseline:  Goal  status: INITIAL  6.  Patient will be able to complete floor transfer mod independent to reduce burden of care Baseline:  Goal status: INITIAL  PLAN:  PT FREQUENCY: 2x/week  PT DURATION: 8 weeks  PLANNED INTERVENTIONS: [id number]- PT Re-evaluation, 97750- Physical Performance Testing, 97110-Therapeutic exercises, 97530- Therapeutic activity, [id number]- Neuromuscular re-education, 97535- Self Care, [id number]- Manual therapy, [phone]- Gait training, Patient/Family education, Balance training, Stair training, Cryotherapy, and Moist heat.  PLAN FOR NEXT SESSION: ABC scale for balance confidence check home exercise program complete more dynamic balance screen consider recumbent bike standing/wall exercises   Ketsia Linebaugh, PT 12/14/2023, 11:28 AM  Delon Norma, PT 12/14/23 11:28 AM Phone: [phone] Fax: [phone]

## 2023-12-14 NOTE — Addendum Note (Signed)
 Addended by: Merrisa Skorupski on: 12/14/2023 02:35 PM   Modules accepted: Orders

## 2023-12-15 LAB — EXTRA LAV TOP TUBE

## 2023-12-15 LAB — CALCIUM: Calcium: 9 mg/dL (ref 8.6–10.4)

## 2023-12-17 ENCOUNTER — Other Ambulatory Visit: Payer: Self-pay | Admitting: Student in an Organized Health Care Education/Training Program

## 2023-12-17 DIAGNOSIS — F39 Unspecified mood [affective] disorder: Secondary | ICD-10-CM

## 2023-12-20 ENCOUNTER — Ambulatory Visit: Admitting: Physical Therapy

## 2023-12-20 ENCOUNTER — Other Ambulatory Visit: Payer: Self-pay | Admitting: Student in an Organized Health Care Education/Training Program

## 2023-12-20 ENCOUNTER — Encounter: Admitting: Physical Therapy

## 2023-12-20 DIAGNOSIS — F411 Generalized anxiety disorder: Secondary | ICD-10-CM

## 2023-12-20 NOTE — Telephone Encounter (Signed)
 Patient is requesting a 90D supply. Okay to change?

## 2023-12-21 NOTE — Therapy (Deleted)
 OUTPATIENT PHYSICAL THERAPY NOTE   Patient Name: [name] MRN: [id number] DOB:September 18, 1955, 68 y.o., female Today's Date: 12/21/2023  END OF SESSION:     Past Medical History:  Diagnosis Date   AF (paroxysmal atrial fibrillation) (HCC)    Allergy    Anxiety    Arthritis    BCC (basal cell carcinoma) 03/11/2003   left distal lower leg ankle-tx dr helga   Biventricular cardiac pacemaker in situ    a. prior CRT-D in 2013 with downgrade to CRT-Pacemaker only in 11/2016.   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 2012: normal coronaries, EF 25-30%. b. improved to 55% 11/2016.   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   Past Surgical History:  Procedure Laterality Date   ARTERY BIOPSY  12/29/2011   Procedure: MINOR BIOPSY TEMPORAL ARTERY;  Surgeon: Morene ONEIDA Olives, MD;  Location: [facility];  Service: General;  Laterality: Right;  Right temporal artery biopsy   BACK SURGERY  12/31/2009   for flat  back syndrome; fused bottom of my back   BI-VENTRICULAR IMPLANTABLE CARDIOVERTER DEFIBRILLATOR Left 01/20/2011   Procedure: BI-VENTRICULAR IMPLANTABLE CARDIOVERTER DEFIBRILLATOR  (CRT-D);  Surgeon: Danelle LELON Birmingham, MD;  Location: [facility] CATH LAB;  Service: Cardiovascular;  Laterality: Left;   BIV ICD GENERATOR CHANGEOUT N/A 11/24/2016   Procedure: BIV ICD GENERATOR CHANGEOUT;  Surgeon: Birmingham Danelle LELON, MD;  Location: [facility] INVASIVE CV LAB;  Service: Cardiovascular;  Laterality: N/A;   BREAST LUMPECTOMY WITH RADIOACTIVE SEED LOCALIZATION Left 01/22/2020   Procedure: LEFT BREAST LUMPECTOMY WITH RADIOACTIVE SEED LOCALIZATION;  Surgeon: Belinda Cough, MD;  Location: [facility];  Service: General;  Laterality: Left;  LMA   BREAST SURGERY     CERVICAL ABLATION  03/29/2023   CHOLECYSTECTOMY  ~1996   FOOT NEUROMA SURGERY  ~ 2003   right   RE-EXCISION OF BREAST LUMPECTOMY Left 03/03/2020   Procedure: RE-EXCISION MARGINS OF LEFT BREAST LUMPECTOMY;  Surgeon: Belinda Cough, MD;  Location: [facility];  Service: General;  Laterality: Left;   ROTATOR CUFF REPAIR  ~ 2009   left   SHOULDER ARTHROSCOPY W/ ROTATOR CUFF REPAIR Right 09/21/2011   SKIN CANCER EXCISION     nose, forehead, left leg   SPINE SURGERY     TEE WITHOUT CARDIOVERSION N/A 03/01/2017   Procedure: TRANSESOPHAGEAL ECHOCARDIOGRAM (TEE);  Surgeon: Raford Riggs, MD;  Location: [facility] ENDOSCOPY;  Service: Cardiovascular;  Laterality: N/A;   THYROID  LOBECTOMY  ~ 2006   right   TUBAL LIGATION  ~ 1983   VAGINAL HYSTERECTOMY  ~ 2009   VESICOVAGINAL FISTULA CLOSURE W/ TAH     Patient Active Problem List   Diagnosis Date Noted   Iron deficiency 11/24/2023   Physical deconditioning 11/24/2023   Hematoma 11/14/2023   Benign essential tremor 07/10/2023   Mood disorder 07/10/2023   Intractable chronic migraine without aura 07/10/2023   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   Restless legs syndrome 07/10/2023    Age-related osteoporosis without current pathological fracture 11/29/2020   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   Biventricular cardiac pacemaker in situ 10/08/2018   Long term current use of anticoagulant therapy 10/23/2017   AICD (automatic cardioverter/defibrillator) present    Aortic regurgitation    Nonischemic cardiomyopathy (HCC) 01/21/2011   Idiopathic peripheral neuropathy 08/24/2010   Paroxysmal atrial fibrillation (HCC) 02/23/2010   Raynaud's phenomenon 02/15/2007    PCP: Jerrell Cleatus Ned, MD  REFERRING PROVIDER: Jerrell Cleatus Ned, MD  REFERRING DIAG: [phone] (ICD-10-CM) - Hematoma of right flank, initial encounter G25.81 (ICD-10-CM) - Restless legs syndrome  Rationale for Evaluation and Treatment: Rehabilitation  THERAPY DIAG:  No diagnosis found.  ONSET DATE: 11/13/23  SUBJECTIVE:                                                                                                                                                                                           SUBJECTIVE STATEMENT: I have had a tough time with my arthritis after last time.  I have had a lot of arthritis pain since then.  Its me, not what we did.  Took Tylenol  arthritis today.   just recently I have started walking the dog with the cane its a challenge. I'm not standing and walking much. I need to get out of that recliner Previous to this fall she reports she was more mobile but not necessarily active and exercising.  She is gradually increasing her activity in the home and reports she Did 1.5 mile the other day with the dog (9 lb) Arms are sore bilaterally, not sure why (cane?) she did sprain the elbow.  Her arms are weak.  She does have neck and back issues, takes meds and was prior to the fall.  PERTINENT HISTORY:  Restless leg syndrome Spine surgery A fib Pacemaker  PAIN:  Are you having pain? Yes: NPRS scale: 2/10 Pain location: bilateral shoulders   Pain description: aching  Aggravating factors: early in AM, moving over in bed,  Relieving factors: as the day goes on Min in  knees and neck.     Yes: NPRS scale: none /10 Pain location: low back Rt side  Pain description: sore  Aggravating factors: walking up a hill increases pain in back Relieving factors: MHP, medicine- Tylenol    PRECAUTIONS: Fall and ICD/Pacemaker  RED FLAGS: None    WEIGHT BEARING RESTRICTIONS: No  FALLS:  Has patient fallen in last 6 months? Yes. Number of falls 1- was bending   LIVING ENVIRONMENT: Lives with: lives with their spouse Lives in: House/apartment Stairs: Yes: External: 2 steps; can reach both Has following equipment at home: Single point cane and Walker - 2 wheeled rollator is on the way   OCCUPATION: retired   PLOF: Independent with basic ADLs, Independent with household mobility with device, Independent with community mobility with device, Requires assistive device for independence, and Leisure: watching TV, be together   PATIENT GOALS: I want to be rejuvenated and get stronger.   NEXT MD VISIT: upcoming -[facility]   OBJECTIVE:  Note: Objective measures were completed at Evaluation unless otherwise noted.  DIAGNOSTIC FINDINGS:  Imaging: CT head without contrast no acute intracranial normality.  CT cervical spine no acute fracture or traumatic malalignment.  There are chronically opacified in the spine the right posterior ethmoid air cells and the sphenoid sinus, suggesting mucocele.  CT of the thoracic/lumbosacral spine showing postoperative thoracolumbar to sacrum and iliac hardware without CT evidence of acute complication.  Diffusely decreased bone density.  Right medial soft tissue acute hematoma measuring up to 13 x 6 cm.  No mesenteric or mediastinal hematoma.  PATIENT SURVEYS:  ABC scale: The Activities-Specific Balance Confidence (ABC) Scale TBA   COGNITION: Overall cognitive status: Within functional limits for tasks  assessed     SENSATION: WFL  MUSCLE LENGTH: Hamstrings: 45-55 deg PROM   POSTURE: rounded shoulders, forward head, decreased lumbar lordosis, and genu valgus L> R   PALPATION: NT   LUMBAR ROM:   AROM eval  Flexion NT   Extension 75%   Right lateral flexion 90%  Left lateral flexion 90%  Right rotation 50%  Left rotation 50%   (Blank rows = not tested)  LOWER EXTREMITY ROM:   WNL   Passive  Right eval Left eval  Hip flexion    Hip extension    Hip abduction    Hip adduction    Hip internal rotation    Hip external rotation    Knee flexion    Knee extension    Ankle dorsiflexion    Ankle plantarflexion    Ankle inversion    Ankle eversion     (Blank rows = not tested)  LOWER EXTREMITY MMT:    MMT Right eval Left eval  Hip flexion 4+ 4+  Hip extension 4- 4-  Hip abduction    Hip adduction    Hip internal rotation    Hip external rotation    Knee flexion 4+ 4+  Knee extension 4+ 4+  Ankle dorsiflexion 4 4+  Ankle plantarflexion    Ankle inversion    Ankle eversion     (Blank rows = not tested  12/05/23: UE 4-/5 Limited shoulder flexion to 105 deg -110 deg bilateral   LUMBAR SPECIAL TESTS:  NT   FUNCTIONAL TESTS:  5 times sit to stand: 16 sec no UEs  2 minute walk test: 375 feet   GAIT: Distance walked: 375  Assistive device utilized: Single point cane Level of assistance: Complete Independence Comments: stiffness noted in trunk, good ability to increase gait speed.  TREATMENT DATE:   [facility] Adult PT Treatment:                                                DATE: 12/22/23 Therapeutic Exercise: *** Manual Therapy: *** Neuromuscular re-ed: *** Therapeutic Activity: *** Modalities: *** Self Care: ***  [facility] Adult PT Treatment:                                                DATE: 12/14/23 Neuromuscular re-ed: Balance on Airex cervical rotation, flexion/extension min A/close S Therapeutic Activity: Sit to stand x 10  Hip abduction  on airex for strength and balance  Row and extension red band 15 reps  Step taps no UEs  Heel raises  x 20  Seated yellow ER x 15 Seated horizontal abd yellow x 10    BERG BALANCE Sitting to Standing: Numbers; 0-4: 4  4. Stands without using hands and stabilize independently  3. Stands independently using hands  2. Stands using hands after multiple trials  1. Min A to stand  0. Mod-Max A to stand Standing unsupported: Numbers; 0-4: 4  4. Stands safely for 2 minutes  3. Stands 2 minutes with supervision  2. Stands 30 seconds unsupported  1. Needs several tries to stand unsupported for 30 seconds  0. Unable to stand unsupported for 30 seconds Sitting unsupported: Numbers; 0-4: 4  4. Sits for 2 minutes independently  3. Sits for 2 minutes with supervision  2. Able to sit 30 seconds  1. Able to sit 10 seconds  0. Unable to sit for 10 seconds Standing to Sitting: Numbers; 0-4: 4 4. Sits safely with minimal use of hands 3. Controls descent with hands 2. Uses back of legs against chair to control descent 1. Sits independently, but uncontrolled descent 0. Needs assistance Transfers: Numbers; 0-4: 4  4. Transfers safely with minor use of hands  3. Transfers safely definite use of hands  2. Transfers with verbal cueing/supervision  1. Needs 1 person assist  0. Needs 2 person assist  Standing with eyes closed: Numbers; 0-4: 4  4. Stands safely for 10 seconds  3. Stands 10 seconds with supervision   2. Able to stand for 3 seconds  1. Unable to keep eyes closed for 3 seconds, but is safe  0. Needs assist to keep from falling Standing with feet together: Numbers; 0-4: 4 4. Stands for 1 minute safely 3. Stands for 1 minute with supervision 2. Unable to hold for 30 seconds  1. Needs help to attain position but can hold for 15 seconds  0. Needs help to attain position and unable to hold for 15 seconds Reaching forward with outstretched arm: Numbers; 0-4: 3  4. Reaches forward 10  inches  3. Reaches forward 5 inches  2. Reaches forward 2 inches  1. Reaches forward with supervision  0. Loses balance/requires assistance Retrieving object from the floor: Numbers; 0-4: 3 4. Able to pick up easily and safely 3. Able to pick up with supervision 2. Unable to pick up, but reaches within 1-2 inches independently 1. Unable to pick up and needs supervision 0. Unable/needs assistance to keep from falling  Turning to look behind: Numbers; 0-4: 3  4.  Looks behind from both sides and weight shifts well  3. Looks behind one side only, other side less weight shift  2. Turns sideways only, maintains balance  1. Needs supervision when turning  0. Needs assistance  Turning 360 degrees: Numbers; 0-4: 2  4. Able to turn in </=4 seconds  3. Able to turn on one side in </= 4 seconds   2. Able to turn slowly, but safely  1. Needs supervision or verbal cueing  0. Needs assistance Place alternate foot on stool: Numbers; 0-4: 4 4. Completes 8 steps in 20 seconds 3. Completes 8 steps in >20 seconds 2. 4 steps without assistance/supervision 1. Completes >2 steps with minimal assist 0. Unable, needs assist to keep from falling Standing with one foot in front: Numbers; 0-4: 3  4. Independent tandem for 30 seconds  3. Independent foot ahead for 30 seconds  2. Independent small step for 30 seconds  1. Needs help to step, but can hold for 15 seconds  0. Loses balance while standing/stepping Standing on one foot: Numbers; 0-4: 4 on Lt LE, Rt < 10 sec  4. Holds >10 seconds 3. Holds 5-10 seconds 2. Holds >/=3 seconds  1. Holds <3 seconds 0. Unable   Total Score: 50/56   [facility] Adult PT Treatment:                                                DATE: 12/05/23 Therapeutic Activity: 2 min walk 5 x STS  Trial reps:  Bridge  LTR with head turns  Row Sit to stand  Self Care: HEP, technique and rationale     Causes of shoulder pain (neck vs posture, activity)                                                                                                                                 PATIENT EDUCATION:  Education details: Self-care see above Person educated: Patient Education method: Programmer, Multimedia, Demonstration, Verbal cues, and Handouts Education comprehension: verbalized understanding and needs further education  HOME EXERCISE PROGRAM: Access Code: [email or web address] URL: [email or web address] Date: 12/05/2023 Prepared by: Delon Norma  Exercises - Supine Lower Trunk Rotation  - 1 x daily - 7 x weekly - 2 sets - 10 reps - 5-10 hold - Supine Bridge  - 1 x daily - 7 x weekly - 2 sets - 10 reps - 5 hold - Standing Row with Anchored Resistance  - 1 x daily - 7 x weekly - 2 sets - 10 reps - 5 hold - Sit to Stand with Arm Reach Toward Target  - 1 x daily - 7 x weekly - 2 sets - 10 reps  ASSESSMENT:  CLINICAL IMPRESSION: Patient reports a great amount of pain after her last session but unclear what may have caused it.  She does deal with quite a bit of neck and shoulder pain and mentioned that she may even need to have spinal surgery in the future.  She was agreeable to exercise today despite the pain and chose to work in a more upright position to see if that could help.  She scored a 50 /56 on her Berg balance test indicating a slight fall risk.  She does move very slowly which made her score little bit lower and this is because she does have a fear of falling which is understandable.  No increase in pain following the session.   OBJECTIVE IMPAIRMENTS: Abnormal gait, decreased activity tolerance, decreased balance, decreased endurance, decreased mobility, difficulty walking, decreased ROM, decreased strength, increased fascial restrictions, impaired flexibility, impaired UE functional use, improper body mechanics, postural dysfunction, and pain.   ACTIVITY LIMITATIONS: carrying, lifting, bending, standing, squatting, sleeping, stairs, transfers, bed mobility, reach  over head, locomotion level, and caring for others  PARTICIPATION LIMITATIONS: meal prep, cleaning, laundry, interpersonal relationship, shopping, community activity, and yard work  PERSONAL FACTORS: 3+ comorbidities: Atrial fibrillation spinal fusion osteoporosis are also affecting patient's functional outcome.   REHAB POTENTIAL: Excellent  CLINICAL DECISION MAKING: Stable/uncomplicated  EVALUATION COMPLEXITY: Low   GOALS: Goals reviewed with patient? Yes  SHORT TERM GOALS: Target date: 01/02/2024   Patient will be able to show independence for initial HEP to include posture, core and hip strength and stability.   Baseline: Goal status: Ongoing 2.  Patient will note improved endurance for standing activities up to 15 minutes in her home involving upper body (laundry, cooking)  Baseline:  Goal status: Ongoing 3.  Patient will complete ABC balance assessment and goal set  Baseline:  Goal status: INITIAL   LONG TERM GOALS: Target date: 01/30/2024    Patient will be independent with final HEP upon discharge from PT and report consistent benefit following exercise completion.    Baseline:  Goal status: INITIAL  2.  Patient will improve 5 x STS to 13 sec or less to demo decreased fall risk.   Baseline: 16 sec Goal status: INITIAL  3.  Patient will be able to improve 2-minute walk distance by 50 feet to show improve endurance and gait stability Baseline:  Goal status: INITIAL  4.  Patient will be able to properly and safely perform hip hinge in order to care for her dogs Baseline:  Goal status: INITIAL  5.  ABC score balance confidence to be assessed Baseline:  Goal status: INITIAL  6.  Patient will be able to complete floor transfer mod independent to reduce burden of care Baseline:  Goal status: INITIAL  PLAN:  PT FREQUENCY: 2x/week  PT DURATION: 8 weeks  PLANNED INTERVENTIONS: [id number]- PT Re-evaluation, 97750- Physical Performance Testing, 97110-Therapeutic  exercises, 97530- Therapeutic activity, [id number]- Neuromuscular re-education, 97535- Self Care, [id number]- Manual therapy, [phone]- Gait training, Patient/Family education, Balance training, Stair training, Cryotherapy, and Moist heat.  PLAN FOR NEXT SESSION: ABC scale for balance confidence check home exercise program complete more dynamic balance screen consider recumbent bike standing/wall exercises   Greydis Stlouis, PT 12/21/2023, 3:20 PM  Delon Norma, PT 12/21/2023 3:20 PM Phone: [phone] Fax: [phone]

## 2023-12-22 ENCOUNTER — Encounter: Admitting: Physical Therapy

## 2023-12-22 ENCOUNTER — Ambulatory Visit: Admitting: Student in an Organized Health Care Education/Training Program

## 2023-12-22 ENCOUNTER — Encounter: Payer: Self-pay | Admitting: Student in an Organized Health Care Education/Training Program

## 2023-12-22 VITALS — BP 106/51 | HR 50 | Wt 161.0 lb

## 2023-12-22 DIAGNOSIS — R5381 Other malaise: Secondary | ICD-10-CM

## 2023-12-22 DIAGNOSIS — G2581 Restless legs syndrome: Secondary | ICD-10-CM

## 2023-12-22 DIAGNOSIS — F39 Unspecified mood [affective] disorder: Secondary | ICD-10-CM

## 2023-12-22 NOTE — Assessment & Plan Note (Signed)
 Restless legs syndrome is managed with magnesium cream, which provides relief. Normal iron levels rule out iron deficiency as a cause. Continue using magnesium cream.

## 2023-12-22 NOTE — Patient Instructions (Signed)
°  VISIT SUMMARY: During your visit, we discussed your recent fall, ongoing gastrointestinal issues, and various other health concerns. You have been experiencing significant stomach pain and frequent diarrhea, which may be related to stress. Your mood has improved with Lexapro , but you still have some stress related to your sister's situation. You are undergoing physical therapy for osteoarthritis and physical deconditioning, and we discussed ways to manage your pain and improve your physical condition. We also reviewed your management plan for restless legs syndrome and osteoporosis.  YOUR PLAN: -MOOD DISORDER (ANXIETY AND DEPRESSION): Your mood disorder, including anxiety and depression, is being managed with Lexapro . This medication has helped improve your sleep and reduce anxiety. Continue taking Lexapro  at the current dose as it has a low risk of side effects.  -OSTEOARTHRITIS WITH HAND AND SHOULDER PAIN: Osteoarthritis is causing significant pain in your hands and shoulders, especially during physical therapy. Despite using Tylenol , the pain persists. You can try using Voltaren  gel for hand pain and continue with physical therapy, making adjustments as needed.  -PHYSICAL DECONDITIONING AND FALL RISK: Physical deconditioning increases your risk of falls. You are currently undergoing physical therapy, which is causing some pain. It is important to complete your physical therapy course and perform home exercises to improve your physical condition and reduce fall risk. We also discussed the possibility of trying aqua physical therapy in the future.  -CHRONIC DIARRHEA: You have been experiencing chronic diarrhea, which may be related to stress. Pepto Bismol provides some relief. Continue using Pepto Bismol as needed.  -RESTLESS LEGS SYNDROME: Restless legs syndrome is being managed with magnesium cream, which provides relief. Your iron levels are normal, so iron deficiency is not the cause. Continue  using the magnesium cream.  -OSTEOPOROSIS: Osteoporosis is being managed with Evenity  injections, which have shown improvement in your bone density. Continue with the Evenity  injections.  INSTRUCTIONS: Please continue with your current medications and treatments as discussed. Make sure to perform your home exercises to improve your physical condition. If you experience any new symptoms or have concerns, please schedule a follow-up appointment.

## 2023-12-22 NOTE — Progress Notes (Signed)
 Established Patient Office Visit  Patient ID: [name], female    DOB: Feb 16, 1955  Age: 68 y.o. MRN: [id number] PCP: Jerrell Cleatus Ned, MD  Chief Complaint  Patient presents with   Medical Management of Chronic Issues    4 week follow up     Subjective:     HPI  Discussed the use of AI scribe software for clinical note transcription with the patient, who gave verbal consent to proceed.  History of Present Illness [name] is a 68 year old female who presents for follow-up after a fall and ongoing gastrointestinal issues.  A month ago, she experienced a significant fall resulting in bruising on her side and backside, which has mostly resolved. She has not had any further falls since then and has been regaining her strength. She was able to drive to [place]  to visit her sister without any issues.  She reports significant stomach pain and frequent diarrhea, with up to three bowel movements a day. She attributes some of her gastrointestinal distress to stress related to her sister's situation. Pepto Bismol provides some relief for her stomach issues.  She started taking Lexapro  last month for anxiety and depression, which has helped her sleep better at night. However, she still experiences occasional restless legs, for which she uses a magnesium cream that provides some relief. Her iron levels were checked and found to be normal.  She is undergoing physical therapy. Despite taking arthritis Tylenol  before sessions, she experiences significant pain in her shoulders and hands, particularly in one hand where gripping is painful. She has tried Therapist, Sports and plans to try Voltaren  gel again for relief.  She mentions stress related to her sister, who has dementia and is having conflicts with her roommate. Her sister wants to move in with her, but she has set boundaries, which has been stressful. Her sister's roommate has Alzheimer's and has become violent, adding to  the stress.  No chest pain or lightheadedness, but she reports occasional shortness of breath. She is eating well but notes that she is eating too much.     Objective:     BP (!) 106/51   Pulse (!) 50   Wt 161 lb (73 kg)   SpO2 96%   BMI 25.21 kg/m   Physical Exam  Gen: Frail-appearing older woman Heart: Irregular, 2 out of 6 early stock murmur at the right upper sternal border Lungs: Unlabored, clear throughout Skin: Hematoma on her right backside is almost resolved Ext: Warm, no edema Abd: Soft, nontender, nondistended    Assessment & Plan:   Problem List Items Addressed This Visit       High   Physical deconditioning - Primary (Chronic)   She experiences physical deconditioning with a risk of falls. Physical therapy is ongoing but causes pain, and she is not performing home exercises, which could improve outcomes. Encourage completion of the physical therapy course and advise performing home exercises to enhance quality of life and reduce fall risk. Discuss the potential for aqua physical therapy in the future.        Medium    Mood disorder (Chronic)   Mood disorder is managed with Lexapro , leading to improved sleep and reduced anxiety, though stress from her sister's dementia and living situation remains. Continue Lexapro  at the current dose due to its low risk of side effects.        Low   Restless legs syndrome (Chronic)   Restless legs syndrome is managed with  magnesium cream, which provides relief. Normal iron levels rule out iron deficiency as a cause. Continue using magnesium cream.        Return in about 3 months (around 03/21/2024).    Cleatus Debby Specking, MD [facility]

## 2023-12-22 NOTE — Assessment & Plan Note (Signed)
 Mood disorder is managed with Lexapro , leading to improved sleep and reduced anxiety, though stress from her sister's dementia and living situation remains. Continue Lexapro  at the current dose due to its low risk of side effects.

## 2023-12-22 NOTE — Assessment & Plan Note (Signed)
 She experiences physical deconditioning with a risk of falls. Physical therapy is ongoing but causes pain, and she is not performing home exercises, which could improve outcomes. Encourage completion of the physical therapy course and advise performing home exercises to enhance quality of life and reduce fall risk. Discuss the potential for aqua physical therapy in the future.

## 2023-12-26 ENCOUNTER — Encounter: Payer: Self-pay | Admitting: Physical Therapy

## 2023-12-26 ENCOUNTER — Ambulatory Visit: Admitting: Physical Therapy

## 2023-12-26 DIAGNOSIS — M6281 Muscle weakness (generalized): Secondary | ICD-10-CM

## 2023-12-26 DIAGNOSIS — R262 Difficulty in walking, not elsewhere classified: Secondary | ICD-10-CM

## 2023-12-26 DIAGNOSIS — R293 Abnormal posture: Secondary | ICD-10-CM

## 2023-12-26 NOTE — Therapy (Signed)
 OUTPATIENT PHYSICAL THERAPY NOTE   Patient Name: [name] MRN: [id number] DOB:08/22/1955, 68 y.o., female Today's Date: 12/26/2023  END OF SESSION:  PT End of Session - 12/26/23 1435     Visit Number 3    Number of Visits 16    Date for Recertification  01/30/24    Authorization Type [employer] MCR    PT Start Time 1440    PT Stop Time 1518    PT Time Calculation (min) 38 min            Past Medical History:  Diagnosis Date   AF (paroxysmal atrial fibrillation) (HCC)    Allergy    Anxiety    Arthritis    BCC (basal cell carcinoma) 03/11/2003   left distal lower leg ankle-tx dr helga   Biventricular cardiac pacemaker in situ    a. prior CRT-D in 2013 with downgrade to CRT-Pacemaker only in 11/2016.   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 2012: normal coronaries, EF 25-30%. b. improved to 55% 11/2016.   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   Past Surgical History:  Procedure Laterality Date   ARTERY  BIOPSY  12/29/2011   Procedure: MINOR BIOPSY TEMPORAL ARTERY;  Surgeon: Morene ONEIDA Olives, MD;  Location: [facility];  Service: General;  Laterality: Right;  Right temporal artery biopsy   BACK SURGERY  12/31/2009   for flat back syndrome; fused bottom of my back   BI-VENTRICULAR IMPLANTABLE CARDIOVERTER DEFIBRILLATOR Left 01/20/2011   Procedure: BI-VENTRICULAR IMPLANTABLE CARDIOVERTER DEFIBRILLATOR  (CRT-D);  Surgeon: Danelle LELON Birmingham, MD;  Location: [facility] CATH LAB;  Service: Cardiovascular;  Laterality: Left;   BIV ICD GENERATOR CHANGEOUT N/A 11/24/2016   Procedure: BIV ICD GENERATOR CHANGEOUT;  Surgeon: Birmingham Danelle LELON, MD;  Location: [facility] INVASIVE CV LAB;  Service: Cardiovascular;  Laterality: N/A;   BREAST LUMPECTOMY WITH RADIOACTIVE SEED LOCALIZATION Left 01/22/2020   Procedure: LEFT BREAST LUMPECTOMY WITH RADIOACTIVE SEED LOCALIZATION;  Surgeon: Belinda Cough, MD;  Location: [facility];  Service: General;  Laterality: Left;  LMA   BREAST SURGERY     CERVICAL ABLATION  03/29/2023   CHOLECYSTECTOMY  ~1996   FOOT NEUROMA SURGERY  ~ 2003   right   RE-EXCISION OF BREAST LUMPECTOMY Left 03/03/2020   Procedure: RE-EXCISION MARGINS OF LEFT BREAST LUMPECTOMY;  Surgeon: Belinda Cough, MD;  Location: [facility];  Service:  General;  Laterality: Left;   ROTATOR CUFF REPAIR  ~ 2009   left   SHOULDER ARTHROSCOPY W/ ROTATOR CUFF REPAIR Right 09/21/2011   SKIN CANCER EXCISION     nose, forehead, left leg   SPINE SURGERY     TEE WITHOUT CARDIOVERSION N/A 03/01/2017   Procedure: TRANSESOPHAGEAL ECHOCARDIOGRAM (TEE);  Surgeon: Raford Riggs, MD;  Location: [facility] ENDOSCOPY;  Service: Cardiovascular;  Laterality: N/A;   THYROID  LOBECTOMY  ~ 2006   right   TUBAL LIGATION  ~ 1983   VAGINAL HYSTERECTOMY  ~ 2009   VESICOVAGINAL FISTULA CLOSURE W/ TAH     Patient Active Problem List   Diagnosis Date Noted   Physical deconditioning 11/24/2023   Benign  essential tremor 07/10/2023   Mood disorder 07/10/2023   Intractable chronic migraine without aura 07/10/2023   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   Restless legs syndrome 07/10/2023   Age-related osteoporosis without current pathological fracture 11/29/2020   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   Biventricular cardiac pacemaker in situ 10/08/2018   Long term current use of anticoagulant therapy 10/23/2017   AICD (automatic cardioverter/defibrillator) present    Aortic regurgitation    Nonischemic cardiomyopathy (HCC) 01/21/2011   Idiopathic peripheral neuropathy 08/24/2010   Paroxysmal atrial fibrillation (HCC) 02/23/2010   Raynaud's phenomenon 02/15/2007    PCP: Jerrell Cleatus Ned, MD  REFERRING PROVIDER: Jerrell Cleatus Ned, MD  REFERRING DIAG: [phone] (ICD-10-CM) - Hematoma of right flank, initial encounter G25.81 (ICD-10-CM) - Restless legs syndrome  Rationale for Evaluation and Treatment: Rehabilitation  THERAPY DIAG:  Muscle weakness (generalized)  Abnormal posture  Difficulty in walking, not elsewhere classified  ONSET DATE: 11/13/23  SUBJECTIVE:                                                                                                                                                                                           SUBJECTIVE STATEMENT: I missed an appt due to travel.  She had a stomach bug.  She has some shoulder pain and neck pain right now I folded laundry.   just recently I have started walking the dog with the cane its a challenge. I'm not standing and walking much. I need to get out of that recliner Previous to this fall she reports she was more mobile but not necessarily active and exercising.  She is gradually increasing her activity in the home and reports she Did 1.5 mile the other day with the dog (9 lb) Arms are sore bilaterally, not sure why (cane?) she did sprain the elbow.  Her arms are weak.  She does  have neck and back  issues, takes meds and was prior to the fall.  PERTINENT HISTORY:  Restless leg syndrome Spine surgery A fib Pacemaker  PAIN:  Are you having pain? Yes: NPRS scale: 2/10 Pain location: bilateral shoulders  Pain description: aching  Aggravating factors: early in AM, moving over in bed,  Relieving factors: as the day goes on Min in knees and neck.     Yes: NPRS scale: none /10 Pain location: low back Rt side  Pain description: sore  Aggravating factors: walking up a hill increases pain in back Relieving factors: MHP, medicine- Tylenol    PRECAUTIONS: Fall and ICD/Pacemaker  RED FLAGS: None    WEIGHT BEARING RESTRICTIONS: No  FALLS:  Has patient fallen in last 6 months? Yes. Number of falls 1- was bending   LIVING ENVIRONMENT: Lives with: lives with their spouse Lives in: House/apartment Stairs: Yes: External: 2 steps; can reach both Has following equipment at home: Single point cane and Walker - 2 wheeled rollator is on the way   OCCUPATION: retired   PLOF: Independent with basic ADLs, Independent with household mobility with device, Independent with community mobility with device, Requires assistive device for independence, and Leisure: watching TV, be together   PATIENT GOALS: I want to be rejuvenated and get stronger.   NEXT MD VISIT: upcoming -[facility]   OBJECTIVE:  Note: Objective measures were completed at Evaluation unless otherwise noted.  DIAGNOSTIC FINDINGS:  Imaging: CT head without contrast no acute intracranial normality.  CT cervical spine no acute fracture or traumatic malalignment.  There are chronically opacified in the spine the right posterior ethmoid air cells and the sphenoid sinus, suggesting mucocele.  CT of the thoracic/lumbosacral spine showing postoperative thoracolumbar to sacrum and iliac hardware without CT evidence of acute complication.  Diffusely decreased bone density.  Right medial soft tissue acute hematoma  measuring up to 13 x 6 cm.  No mesenteric or mediastinal hematoma.  PATIENT SURVEYS:  ABC scale: The Activities-Specific Balance Confidence (ABC) Scale TBA   COGNITION: Overall cognitive status: Within functional limits for tasks assessed     SENSATION: WFL  MUSCLE LENGTH: Hamstrings: 45-55 deg PROM   POSTURE: rounded shoulders, forward head, decreased lumbar lordosis, and genu valgus L> R   PALPATION: NT   LUMBAR ROM:   AROM eval  Flexion NT   Extension 75%   Right lateral flexion 90%  Left lateral flexion 90%  Right rotation 50%  Left rotation 50%   (Blank rows = not tested)  LOWER EXTREMITY ROM:   WNL   Passive  Right eval Left eval  Hip flexion    Hip extension    Hip abduction    Hip adduction    Hip internal rotation    Hip external rotation    Knee flexion    Knee extension    Ankle dorsiflexion    Ankle plantarflexion    Ankle inversion    Ankle eversion     (Blank rows = not tested)  LOWER EXTREMITY MMT:    MMT Right eval Left eval  Hip flexion 4+ 4+  Hip extension 4- 4-  Hip abduction    Hip adduction    Hip internal rotation    Hip external rotation    Knee flexion 4+ 4+  Knee extension 4+ 4+  Ankle dorsiflexion 4 4+  Ankle plantarflexion    Ankle inversion    Ankle eversion     (Blank rows = not tested  12/05/23: UE 4-/5 Limited shoulder flexion to 105 deg -  110 deg bilateral   LUMBAR SPECIAL TESTS:  NT   FUNCTIONAL TESTS:  5 times sit to stand: 16 sec no UEs  2 minute walk test: 375 feet   GAIT: Distance walked: 375  Assistive device utilized: Single point cane Level of assistance: Complete Independence Comments: stiffness noted in trunk, good ability to increase gait speed.   TREATMENT DATE:   [facility] Adult PT Treatment:                                                DATE: 12/26/23 Therapeutic Exercise: Recumbent bike L 2 for 5 min  Standing at wall for posture Shoulder flexion dowel  ER yellow band x 10 x 2   Horizontal abd pull yellow x 15  Row green x 15 Extension green  x 15  Bridge with ball  LTR supine  Horizonal pull x 15     PATIENT EDUCATION:  Education details: Self-care see above Person educated: Patient Education method: Programmer, Multimedia, Facilities Manager, Verbal cues, and Handouts Education comprehension: verbalized understanding and needs further education  HOME EXERCISE PROGRAM: Access Code: [email or web address] URL: [email or web address] Date: 12/05/2023 Prepared by: Delon Norma  Exercises - Supine Lower Trunk Rotation  - 1 x daily - 7 x weekly - 2 sets - 10 reps - 5-10 hold - Supine Bridge  - 1 x daily - 7 x weekly - 2 sets - 10 reps - 5 hold - Standing Row with Anchored Resistance  - 1 x daily - 7 x weekly - 2 sets - 10 reps - 5 hold - Sit to Stand with Arm Reach Toward Target  - 1 x daily - 7 x weekly - 2 sets - 10 reps -ER and chest pull    ASSESSMENT:  CLINICAL IMPRESSION:  Patient continues to have neck and bilateral shoulder pain moves slowly in general but some stiffness noted.  Added very light band exercises to support her upper back and rotator cuff.  She was given the ABC confidence scale to fill out independently at home before her next visit.  She will continue to benefit from skilled physical therapy in order to meet her goals of more stamina.    OBJECTIVE IMPAIRMENTS: Abnormal gait, decreased activity tolerance, decreased balance, decreased endurance, decreased mobility, difficulty walking, decreased ROM, decreased strength, increased fascial restrictions, impaired flexibility, impaired UE functional use, improper body mechanics, postural dysfunction, and pain.   ACTIVITY LIMITATIONS: carrying, lifting, bending, standing, squatting, sleeping, stairs, transfers, bed mobility, reach over head, locomotion level, and caring for others  PARTICIPATION LIMITATIONS: meal prep, cleaning, laundry, interpersonal relationship, shopping, community activity, and yard  work  PERSONAL FACTORS: 3+ comorbidities: Atrial fibrillation spinal fusion osteoporosis are also affecting patient's functional outcome.   REHAB POTENTIAL: Excellent  CLINICAL DECISION MAKING: Stable/uncomplicated  EVALUATION COMPLEXITY: Low   GOALS: Goals reviewed with patient? Yes  SHORT TERM GOALS: Target date: 01/02/2024   Patient will be able to show independence for initial HEP to include posture, core and hip strength and stability.   Baseline: Goal status: Ongoing 2.  Patient will note improved endurance for standing activities up to 15 minutes in her home involving upper body (laundry, cooking)  Baseline:  Goal status: Ongoing 3.  Patient will complete ABC balance assessment and goal set  Baseline:  Goal status: INITIAL   LONG TERM GOALS: Target date: 01/30/2024  Patient will be independent with final HEP upon discharge from PT and report consistent benefit following exercise completion.    Baseline:  Goal status: INITIAL  2.  Patient will improve 5 x STS to 13 sec or less to demo decreased fall risk.   Baseline: 16 sec Goal status: INITIAL  3.  Patient will be able to improve 2-minute walk distance by 50 feet to show improve endurance and gait stability Baseline:  Goal status: INITIAL  4.  Patient will be able to properly and safely perform hip hinge in order to care for her dogs Baseline:  Goal status: INITIAL  5.  ABC score balance confidence to be assessed Baseline:  Goal status: INITIAL  6.  Patient will be able to complete floor transfer mod independent to reduce burden of care Baseline:  Goal status: INITIAL  PLAN:  PT FREQUENCY: 2x/week  PT DURATION: 8 weeks  PLANNED INTERVENTIONS: [id number]- PT Re-evaluation, 97750- Physical Performance Testing, 97110-Therapeutic exercises, 97530- Therapeutic activity, [id number]- Neuromuscular re-education, 97535- Self Care, [id number]- Manual therapy, [phone]- Gait training, Patient/Family education, Balance  training, Stair training, Cryotherapy, and Moist heat.  PLAN FOR NEXT SESSION: ABC scale for balance confidence check home exercise program complete more dynamic balance screen consider recumbent bike standing/wall exercises   Calia Napp, PT 12/26/2023, 3:24 PM  Delon Norma, PT 12/26/2023 3:24 PM Phone: [phone] Fax: [phone]

## 2023-12-28 ENCOUNTER — Encounter: Admitting: Physical Therapy

## 2023-12-29 ENCOUNTER — Encounter: Payer: Self-pay | Admitting: Neurological Surgery

## 2024-01-01 ENCOUNTER — Other Ambulatory Visit

## 2024-01-02 ENCOUNTER — Telehealth: Payer: Self-pay

## 2024-01-02 NOTE — Telephone Encounter (Signed)
 Tried calling x 2 but was met with a medical device alert line

## 2024-01-02 NOTE — Telephone Encounter (Signed)
 [facility] called wanting to know if an appeal would be done for the denial of Evenity  due to being non formulary. Number for appeal is [phone], fax# [phone].    Alternative medications are Forteo, Prolia, and Tymlos.  CB# for [facility] is [phone].  Please advise.  Thank you.

## 2024-01-08 ENCOUNTER — Ambulatory Visit
Admission: RE | Admit: 2024-01-08 | Discharge: 2024-01-08 | Disposition: A | Source: Ambulatory Visit | Attending: Neurological Surgery | Admitting: Neurological Surgery

## 2024-01-08 DIAGNOSIS — M542 Cervicalgia: Secondary | ICD-10-CM

## 2024-01-08 NOTE — Therapy (Unsigned)
 " OUTPATIENT PHYSICAL THERAPY NOTE   Patient Name: [name] MRN: [id number] DOB:04-11-1955, 68 y.o., female Today's Date: 01/09/2024  END OF SESSION:  PT End of Session - 01/09/24 0938     Visit Number 4    Number of Visits 16    Date for Recertification  01/30/24    Authorization Type [employer] MCR    PT Start Time 9065    PT Stop Time 1015    PT Time Calculation (min) 41 min    Activity Tolerance Patient tolerated treatment well    Behavior During Therapy [facility] for tasks assessed/performed             Past Medical History:  Diagnosis Date   AF (paroxysmal atrial fibrillation) (HCC)    Allergy    Anxiety    Arthritis    BCC (basal cell carcinoma) 03/11/2003   left distal lower leg ankle-tx dr helga   Biventricular cardiac pacemaker in situ    a. prior CRT-D in 2013 with downgrade to CRT-Pacemaker only in 11/2016.   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 2012: normal coronaries, EF 25-30%. b. improved to 55% 11/2016.   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   Past Surgical History:  Procedure Laterality Date   ARTERY BIOPSY  12/29/2011   Procedure: MINOR BIOPSY TEMPORAL ARTERY;  Surgeon: Morene ONEIDA Olives, MD;  Location: [facility];  Service: General;  Laterality: Right;  Right temporal artery biopsy   BACK SURGERY  12/31/2009   for flat back syndrome; fused bottom of my back   BI-VENTRICULAR IMPLANTABLE CARDIOVERTER DEFIBRILLATOR Left 01/20/2011   Procedure: BI-VENTRICULAR IMPLANTABLE CARDIOVERTER DEFIBRILLATOR  (CRT-D);  Surgeon: Danelle LELON Birmingham, MD;  Location: [facility] CATH LAB;  Service: Cardiovascular;  Laterality: Left;   BIV ICD GENERATOR CHANGEOUT N/A 11/24/2016   Procedure: BIV ICD GENERATOR CHANGEOUT;  Surgeon: Birmingham Danelle LELON, MD;  Location: [facility] INVASIVE CV LAB;  Service: Cardiovascular;  Laterality: N/A;   BREAST LUMPECTOMY WITH RADIOACTIVE SEED LOCALIZATION Left 01/22/2020   Procedure: LEFT BREAST LUMPECTOMY WITH RADIOACTIVE SEED LOCALIZATION;  Surgeon: Belinda Cough, MD;  Location: [facility];  Service: General;  Laterality: Left;  LMA   BREAST SURGERY     CERVICAL ABLATION  03/29/2023   CHOLECYSTECTOMY  ~1996   FOOT NEUROMA SURGERY  ~ 2003   right   RE-EXCISION OF BREAST LUMPECTOMY Left 03/03/2020  Procedure: RE-EXCISION MARGINS OF LEFT BREAST LUMPECTOMY;  Surgeon: Belinda Cough, MD;  Location: [facility];  Service: General;  Laterality: Left;   ROTATOR CUFF REPAIR  ~ 2009   left   SHOULDER ARTHROSCOPY W/ ROTATOR CUFF REPAIR Right 09/21/2011   SKIN CANCER EXCISION     nose, forehead, left leg   SPINE SURGERY     TEE WITHOUT CARDIOVERSION N/A 03/01/2017   Procedure: TRANSESOPHAGEAL ECHOCARDIOGRAM (TEE);  Surgeon: Raford Riggs, MD;  Location[facility];  Service: Cardiovascular;  Laterality: N/A;   THYROID  LOBECTOMY  ~ 2006   right   TUBAL LIGATION  ~ 1983   VAGINAL HYSTERECTOMY  ~ 2009   VESICOVAGINAL FISTULA CLOSURE  W/ TAH     Patient Active Problem List   Diagnosis Date Noted   Physical deconditioning 11/24/2023   Benign essential tremor 07/10/2023   Mood disorder 07/10/2023   Intractable chronic migraine without aura 07/10/2023   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   Restless legs syndrome 07/10/2023   Age-related osteoporosis without current pathological fracture 11/29/2020   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   Biventricular cardiac pacemaker in situ 10/08/2018   Long term current use of anticoagulant therapy 10/23/2017   AICD (automatic cardioverter/defibrillator) present    Aortic regurgitation    Nonischemic cardiomyopathy (HCC) 01/21/2011   Idiopathic peripheral neuropathy 08/24/2010   Paroxysmal atrial fibrillation (HCC) 02/23/2010   Raynaud's phenomenon 02/15/2007    PCP: Jerrell Cleatus Ned, MD  REFERRING PROVIDER: Jerrell Cleatus Ned, MD  REFERRING DIAG: [phone] (ICD-10-CM) - Hematoma of right flank, initial encounter G25.81 (ICD-10-CM) - Restless legs syndrome  Rationale for Evaluation and Treatment: Rehabilitation  THERAPY DIAG:  Muscle weakness (generalized)  Abnormal posture  Difficulty in walking, not elsewhere classified  Pain in both upper extremities  ONSET DATE: 11/13/23  SUBJECTIVE:                                                                                                                                                                                           SUBJECTIVE STATEMENT:  Patient has pain in L UE especially at night. I am doing more around the house. It seems I have more strength lately.  Patient reports she recently had a CT to see if her Sylvester has helped her cervical spine.     Just recently I have started walking the dog with the cane its a challenge. I'm not standing and walking much. I need to get out of that recliner Previous to this fall she reports she was more mobile but not necessarily active and  exercising.  She is gradually increasing her  activity in the home and reports she Did 1.5 mile the other day with the dog (9 lb) Arms are sore bilaterally, not sure why (cane?) she did sprain the elbow.  Her arms are weak.  She does have neck and back issues, takes meds and was prior to the fall.  PERTINENT HISTORY:  Restless leg syndrome Spine surgery A fib Pacemaker  PAIN:  Are you having pain? Yes: NPRS scale: 2-3/10 Pain location: bilateral shoulders  Pain description: aching  Aggravating factors: early in AM, moving over in bed,  Relieving factors: as the day goes on  Neck hurting not too bad.   Yes: NPRS scale: none /10 Pain location: low back Rt side  Pain description: sore  Aggravating factors: walking up a hill increases pain in back Relieving factors: MHP, medicine- Tylenol    PRECAUTIONS: Fall and ICD/Pacemaker  RED FLAGS: None    WEIGHT BEARING RESTRICTIONS: No  FALLS:  Has patient fallen in last 6 months? Yes. Number of falls 1- was bending   LIVING ENVIRONMENT: Lives with: lives with their spouse Lives in: House/apartment Stairs: Yes: External: 2 steps; can reach both Has following equipment at home: Single point cane and Walker - 2 wheeled rollator is on the way   OCCUPATION: retired   PLOF: Independent with basic ADLs, Independent with household mobility with device, Independent with community mobility with device, Requires assistive device for independence, and Leisure: watching TV, be together   PATIENT GOALS: I want to be rejuvenated and get stronger.   NEXT MD VISIT: upcoming -[facility]   OBJECTIVE:  Note: Objective measures were completed at Evaluation unless otherwise noted.  DIAGNOSTIC FINDINGS:  Imaging: CT head without contrast no acute intracranial normality.  CT cervical spine no acute fracture or traumatic malalignment.  There are chronically opacified in the spine the right posterior ethmoid air cells and the sphenoid sinus, suggesting  mucocele.  CT of the thoracic/lumbosacral spine showing postoperative thoracolumbar to sacrum and iliac hardware without CT evidence of acute complication.  Diffusely decreased bone density.  Right medial soft tissue acute hematoma measuring up to 13 x 6 cm.  No mesenteric or mediastinal hematoma.  PATIENT SURVEYS:  ABC scale: The Activities-Specific Balance Confidence (ABC) Scale TBA   COGNITION: Overall cognitive status: Within functional limits for tasks assessed     SENSATION: WFL  MUSCLE LENGTH: Hamstrings: 45-55 deg PROM   POSTURE: rounded shoulders, forward head, decreased lumbar lordosis, and genu valgus L> R   PALPATION: NT   LUMBAR ROM:   AROM eval  Flexion NT   Extension 75%   Right lateral flexion 90%  Left lateral flexion 90%  Right rotation 50%  Left rotation 50%   (Blank rows = not tested)  LOWER EXTREMITY ROM:   01/09/24 Pain with Rt passive ER and abd   Passive  Right eval Left eval  Hip flexion    Hip extension    Hip abduction    Hip adduction    Hip internal rotation    Hip external rotation    Knee flexion    Knee extension    Ankle dorsiflexion    Ankle plantarflexion    Ankle inversion    Ankle eversion     (Blank rows = not tested)  LOWER EXTREMITY MMT:    MMT Right eval Left eval  Hip flexion 4+ 4+  Hip extension 4- 4-  Hip abduction    Hip adduction    Hip internal rotation    Hip external rotation  Knee flexion 4+ 4+  Knee extension 4+ 4+  Ankle dorsiflexion 4 4+  Ankle plantarflexion    Ankle inversion    Ankle eversion     (Blank rows = not tested  12/05/23: UE 4-/5 Limited shoulder flexion to 105 deg -110 deg bilateral  Shoulder strength: Rt UE: ER/IR    LUMBAR SPECIAL TESTS:  NT   FUNCTIONAL TESTS:  5 times sit to stand: 16 sec no UEs  2 minute walk test: 375 feet   GAIT: Distance walked: 375  Assistive device utilized: Single point cane Level of assistance: Complete Independence Comments: stiffness  noted in trunk, good ability to increase gait speed.   TREATMENT DATE:    [facility] Adult PT Treatment:                                                DATE: 01/09/24 Therapeutic Exercise: LTR head turns  Band pulls red, yellow 2 x 15 ER red, yellow  2 x 15  AAROM dowel  Bridging with sl post. tilt  x 10  Seated cervical ROM ext/flexion, sidebending and rotation  sit to stand Heel raises Self Care: Discussed gentle strengthening and mobility for cervical and shoulder Talked about osteoporosis as well and falls Importance of building up muscle and bone density with aging and that it is never too late to improve your strength.   [facility] Adult PT Treatment:                                                DATE: 12/26/23 Therapeutic Exercise: Recumbent bike L 2 for 5 min  Standing at wall for posture Shoulder flexion dowel  ER yellow band x 10 x 2  Horizontal abd pull yellow x 15  Row green x 15 Extension green  x 15  Bridge with ball  LTR supine  Horizonal pull x 15     PATIENT EDUCATION:  Education details: Self-care see above Person educated: Patient Education method: Programmer, Multimedia, Facilities Manager, Verbal cues, and Handouts Education comprehension: verbalized understanding and needs further education  HOME EXERCISE PROGRAM: Access Code: [email or web address] URL: [email or web address] Date: 12/05/2023 Prepared by: Delon Norma  Exercises - Supine Lower Trunk Rotation  - 1 x daily - 7 x weekly - 2 sets - 10 reps - 5-10 hold - Supine Bridge  - 1 x daily - 7 x weekly - 2 sets - 10 reps - 5 hold - Standing Row with Anchored Resistance  - 1 x daily - 7 x weekly - 2 sets - 10 reps - 5 hold - Sit to Stand with Arm Reach Toward Target  - 1 x daily - 7 x weekly - 2 sets - 10 reps -ER and chest pull    ASSESSMENT:  CLINICAL IMPRESSION:  Patient with stiffness in her neck trunk and shoulders.  Ensured she understands her home exercise program and the importance of resistance  training in order to build muscle endurance and strength.  Her ABC confidence scale was 57%.  She continues to walk the dog every day using the cane and hopes to continue to feel more steady and strong with daily activities She will continue to benefit from skilled physical therapy in order to meet her goals  of more stamina.  She mentioned her doctor wants her to do pool therapy when she is done with therapy for her shoulders   OBJECTIVE IMPAIRMENTS: Abnormal gait, decreased activity tolerance, decreased balance, decreased endurance, decreased mobility, difficulty walking, decreased ROM, decreased strength, increased fascial restrictions, impaired flexibility, impaired UE functional use, improper body mechanics, postural dysfunction, and pain.   ACTIVITY LIMITATIONS: carrying, lifting, bending, standing, squatting, sleeping, stairs, transfers, bed mobility, reach over head, locomotion level, and caring for others  PARTICIPATION LIMITATIONS: meal prep, cleaning, laundry, interpersonal relationship, shopping, community activity, and yard work  PERSONAL FACTORS: 3+ comorbidities: Atrial fibrillation spinal fusion osteoporosis are also affecting patient's functional outcome.   REHAB POTENTIAL: Excellent  CLINICAL DECISION MAKING: Stable/uncomplicated  EVALUATION COMPLEXITY: Low   GOALS: Goals reviewed with patient? Yes  SHORT TERM GOALS: Target date: 01/02/2024   Patient will be able to show independence for initial HEP to include posture, core and hip strength and stability.   Baseline: Goal status: Ongoing 2.  Patient will note improved endurance for standing activities up to 15 minutes in her home involving upper body (laundry, cooking)  Baseline:  Goal status: Ongoing 3.  Patient will complete ABC balance assessment and goal set  Baseline:  Goal status: Met   LONG TERM GOALS: Target date: 01/30/2024    Patient will be independent with final HEP upon discharge from PT and report  consistent benefit following exercise completion.    Baseline:  Goal status: INITIAL  2.  Patient will improve 5 x STS to 13 sec or less to demo decreased fall risk.   Baseline: 16 sec Goal status: INITIAL  3.  Patient will be able to improve 2-minute walk distance by 50 feet to show improve endurance and gait stability Baseline:  Goal status: Ongoing  4.  Patient will be able to properly and safely perform hip hinge in order to care for her dogs Baseline:  Goal status: Ongoing needs reinforcement  5.  ABC score balance confidence t will improved by 20% baseline: 57% confident Goal status: Ongoing  6.  Patient will be able to complete floor transfer mod independent to reduce burden of care Baseline:  Goal status: Ongoing PLAN:  PT FREQUENCY: 2x/week  PT DURATION: 8 weeks  PLANNED INTERVENTIONS: 97164- PT Re-evaluation, 97750- Physical Performance Testing, 97110-Therapeutic exercises, 97530- Therapeutic activity, [id number]- Neuromuscular re-education, 97535- Self Care, [id number]- Manual therapy, [phone]- Gait training, Patient/Family education, Balance training, Stair training, Cryotherapy, and Moist heat.  PLAN FOR NEXT SESSION: check home exercise program complete more dynamic balance screen consider recumbent bike standing/wall exercises   Haeli Gerlich, PT 01/09/2024, 9:39 AM  Delon Norma, PT 01/09/2024 9:39 AM Phone: [phone] Fax: [phone]   "

## 2024-01-09 ENCOUNTER — Encounter: Payer: Self-pay | Admitting: Physical Therapy

## 2024-01-09 ENCOUNTER — Ambulatory Visit: Payer: Self-pay | Admitting: Physical Therapy

## 2024-01-09 DIAGNOSIS — M79602 Pain in left arm: Secondary | ICD-10-CM | POA: Diagnosis not present

## 2024-01-09 DIAGNOSIS — R293 Abnormal posture: Secondary | ICD-10-CM | POA: Diagnosis not present

## 2024-01-09 DIAGNOSIS — M79601 Pain in right arm: Secondary | ICD-10-CM

## 2024-01-09 DIAGNOSIS — R262 Difficulty in walking, not elsewhere classified: Secondary | ICD-10-CM | POA: Diagnosis not present

## 2024-01-09 DIAGNOSIS — M6281 Muscle weakness (generalized): Secondary | ICD-10-CM | POA: Diagnosis not present

## 2024-01-16 ENCOUNTER — Encounter: Payer: Self-pay | Admitting: Physician Assistant

## 2024-01-16 ENCOUNTER — Ambulatory Visit: Admitting: Physician Assistant

## 2024-01-16 DIAGNOSIS — M810 Age-related osteoporosis without current pathological fracture: Secondary | ICD-10-CM

## 2024-01-16 MED ORDER — ROMOSOZUMAB-AQQG 105 MG/1.17ML ~~LOC~~ SOSY
210.0000 mg | PREFILLED_SYRINGE | Freq: Once | SUBCUTANEOUS | Status: AC
  Administered 2024-01-16: 210 mg via SUBCUTANEOUS

## 2024-01-16 NOTE — Addendum Note (Signed)
 Addended by: MARCINE HUSBAND T on: 01/16/2024 02:39 PM   Modules accepted: Orders

## 2024-01-16 NOTE — Progress Notes (Signed)
 "  Office Visit Note   Patient: [name]           Date of Birth: [birth year]           MRN: [id number] Visit Date: 01/16/2024              Requested by: Jerrell Dana Ned, MD [id number] [place],  [place] [address] PCP: Jerrell Dana Ned, MD  No chief complaint on file.     HPI: Patient is a pleasant 69 year old woman comes in for her seventh Evenity  injection has tolerated the previous without difficulty  Assessment & Plan: Visit Diagnoses:  1. Age-related osteoporosis without current pathological fracture     Plan: Follow-up in 31 days for next injection  Follow-Up Instructions: No follow-ups on file.   Ortho Exam  Patient is alert, oriented, no adenopathy, well-dressed, normal affect, normal respiratory effort.     Imaging: No results found. No images are attached to the encounter.  Labs: Lab Results  Component Value Date   ESRSEDRATE 4 01/09/2023   ESRSEDRATE 18 04/07/2021   ESRSEDRATE 55 (H) 02/12/2010   CRP 6 01/09/2023   REPTSTATUS 03/02/2017 FINAL 02/25/2017   GRAMSTAIN  02/09/2010    RARE WBC PRESENT,BOTH PMN AND MONONUCLEAR NO ORGANISMS SEEN   CULT  02/25/2017    NO GROWTH 5 DAYS Performed at [facility], [address] [place], [place] [address]      Lab Results  Component Value Date   ALBUMIN  3.3 (L) 11/15/2023   ALBUMIN  4.1 11/13/2023   ALBUMIN  4.0 02/04/2021    Lab Results  Component Value Date   MG 1.7 02/12/2010   MG 1.6 02/11/2010   MG 2.0 02/09/2010   Lab Results  Component Value Date   VD25OH CANCELED 04/24/2023    No results found for: PREALBUMIN    Latest Ref Rng & Units 11/24/2023   12:15 PM 11/16/2023    5:47 AM 11/15/2023    5:30 AM  CBC EXTENDED  WBC 4.0 - 10.5 K/uL 9.4  7.5  6.6   RBC 3.87 - 5.11 Mil/uL 3.96  3.16  3.33   Hemoglobin 12.0 - 15.0 g/dL 87.7  9.5  89.9   HCT 63.9 - 46.0 % 36.6  29.2  31.0   Platelets 150.0 - 400.0 K/uL 441.0  226  156      There is no height or  weight on file to calculate BMI.  Orders:  No orders of the defined types were placed in this encounter.  No orders of the defined types were placed in this encounter.    Procedures: No procedures performed  Clinical Data: No additional findings.  ROS:  All other systems negative, except as noted in the HPI. Review of Systems  Objective: Vital Signs: There were no vitals taken for this visit.  Specialty Comments:  No specialty comments available.  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   Biventricular cardiac pacemaker in situ 10/08/2018   Long term current use of anticoagulant therapy 10/23/2017   AICD (automatic cardioverter/defibrillator) present    Aortic regurgitation    Nonischemic cardiomyopathy (HCC) 01/21/2011  Idiopathic peripheral neuropathy 08/24/2010   Paroxysmal atrial fibrillation (HCC) 02/23/2010   Raynaud's phenomenon 02/15/2007   Past Medical History:  Diagnosis Date   AF (paroxysmal atrial fibrillation) (HCC)    Allergy    Anxiety    Arthritis    BCC (basal cell carcinoma) 03/11/2003   left distal lower leg ankle-tx dr helga   Biventricular cardiac pacemaker in situ    a. prior CRT-D in 2013 with downgrade to CRT-Pacemaker only in 11/2016.   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 2012: normal coronaries, EF 25-30%. b. improved to 55% 11/2016.   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    Family History  Problem Relation Age of Onset   Heart disease Mother    Heart failure Mother 38       CHF   Stroke Mother    Hypertension Mother    Varicose Veins Mother    Heart disease Father 71   Macular degeneration Father    Hearing loss Father    Vision loss Father    Heart attack Sister    Depression Sister    Heart disease Sister    Varicose Veins Sister    Atrial fibrillation Sister    Heart disease Sister    Arthritis Sister     Past Surgical History:  Procedure Laterality Date   ARTERY BIOPSY  12/29/2011   Procedure: MINOR BIOPSY TEMPORAL ARTERY;  Surgeon: Morene ONEIDA Olives, MD;  Location: [facility];  Service: General;  Laterality: Right;  Right temporal artery biopsy   BACK SURGERY  12/31/2009   for flat back syndrome; fused bottom of my back   BI-VENTRICULAR IMPLANTABLE CARDIOVERTER DEFIBRILLATOR Left 01/20/2011   Procedure: BI-VENTRICULAR IMPLANTABLE CARDIOVERTER DEFIBRILLATOR  (CRT-D);  Surgeon: Danelle LELON Birmingham, MD;  Location: [facility] CATH LAB;  Service: Cardiovascular;  Laterality: Left;   BIV ICD GENERATOR CHANGEOUT N/A 11/24/2016   Procedure: BIV ICD  GENERATOR CHANGEOUT;  Surgeon: Birmingham Danelle LELON, MD;  Location: [facility] INVASIVE CV LAB;  Service: Cardiovascular;  Laterality: N/A;   BREAST LUMPECTOMY WITH RADIOACTIVE SEED LOCALIZATION Left 01/22/2020   Procedure: LEFT BREAST LUMPECTOMY WITH RADIOACTIVE SEED LOCALIZATION;  Surgeon: Belinda Cough, MD;  Location: [facility];  Service: General;  Laterality: Left;  LMA   BREAST SURGERY     CERVICAL ABLATION  03/29/2023   CHOLECYSTECTOMY  ~1996   FOOT NEUROMA SURGERY  ~  2003   right   RE-EXCISION OF BREAST LUMPECTOMY Left 03/03/2020   Procedure: RE-EXCISION MARGINS OF LEFT BREAST LUMPECTOMY;  Surgeon: Belinda Cough, MD;  Location: [facility];  Service: General;  Laterality: Left;   ROTATOR CUFF REPAIR  ~ 2009   left   SHOULDER ARTHROSCOPY W/ ROTATOR CUFF REPAIR Right 09/21/2011   SKIN CANCER EXCISION     nose, forehead, left leg   SPINE SURGERY     TEE WITHOUT CARDIOVERSION N/A 03/01/2017   Procedure: TRANSESOPHAGEAL ECHOCARDIOGRAM (TEE);  Surgeon: Raford Riggs, MD;  Location: [facility] ENDOSCOPY;  Service: Cardiovascular;  Laterality: N/A;   THYROID  LOBECTOMY  ~ 2006   right   TUBAL LIGATION  ~ 1983   VAGINAL HYSTERECTOMY  ~ 2009   VESICOVAGINAL FISTULA CLOSURE W/ TAH     Social History   Occupational History   Occupation: Runner, Broadcasting/film/video    Comment: n/a   Occupation: RETIRED  Tobacco Use   Smoking status: Never    Passive exposure: Never   Smokeless tobacco: Never  Vaping Use   Vaping status: Never Used  Substance and Sexual Activity   Alcohol  use: No   Drug use: No   Sexual activity: Yes       "

## 2024-01-18 ENCOUNTER — Ambulatory Visit: Admitting: Physical Therapy

## 2024-01-18 ENCOUNTER — Encounter: Payer: Self-pay | Admitting: Physical Therapy

## 2024-01-18 DIAGNOSIS — M79601 Pain in right arm: Secondary | ICD-10-CM

## 2024-01-18 DIAGNOSIS — M79602 Pain in left arm: Secondary | ICD-10-CM | POA: Diagnosis not present

## 2024-01-18 DIAGNOSIS — M6281 Muscle weakness (generalized): Secondary | ICD-10-CM | POA: Diagnosis not present

## 2024-01-18 DIAGNOSIS — R262 Difficulty in walking, not elsewhere classified: Secondary | ICD-10-CM | POA: Diagnosis not present

## 2024-01-18 DIAGNOSIS — R293 Abnormal posture: Secondary | ICD-10-CM | POA: Diagnosis not present

## 2024-01-18 NOTE — Therapy (Signed)
 " OUTPATIENT PHYSICAL THERAPY NOTE   Patient Name: [name] MRN: [id number] DOB:1955-11-29, 69 y.o., female Today's Date: 01/18/2024  END OF SESSION:  PT End of Session - 01/18/24 1309     Visit Number 5    Number of Visits 16    Date for Recertification  01/30/24    Authorization Type [employer] MCR    PT Start Time 1304    PT Stop Time 1345    PT Time Calculation (min) 41 min    Activity Tolerance Patient tolerated treatment well    Behavior During Therapy WFL for tasks assessed/performed              Past Medical History:  Diagnosis Date   AF (paroxysmal atrial fibrillation) (HCC)    Allergy    Anxiety    Arthritis    BCC (basal cell carcinoma) 03/11/2003   left distal lower leg ankle-tx dr helga   Biventricular cardiac pacemaker in situ    a. prior CRT-D in 2013 with downgrade to CRT-Pacemaker only in 11/2016.   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 2012: normal coronaries, EF 25-30%. b. improved to 55% 11/2016.   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   Past Surgical History:  Procedure Laterality Date   ARTERY BIOPSY  12/29/2011   Procedure: MINOR BIOPSY TEMPORAL ARTERY;  Surgeon: Morene ONEIDA Olives, MD;  Location: [facility];  Service: General;  Laterality: Right;  Right temporal artery biopsy   BACK SURGERY  12/31/2009   for flat back syndrome; fused bottom of my back   BI-VENTRICULAR IMPLANTABLE CARDIOVERTER DEFIBRILLATOR Left 01/20/2011   Procedure: BI-VENTRICULAR IMPLANTABLE CARDIOVERTER DEFIBRILLATOR  (CRT-D);  Surgeon: Danelle LELON Birmingham, MD;  Location: [facility] CATH LAB;  Service: Cardiovascular;  Laterality: Left;   BIV ICD GENERATOR CHANGEOUT N/A 11/24/2016   Procedure: BIV ICD GENERATOR CHANGEOUT;  Surgeon: Birmingham Danelle LELON, MD;  Location: [facility] INVASIVE CV LAB;  Service: Cardiovascular;  Laterality: N/A;   BREAST LUMPECTOMY WITH RADIOACTIVE SEED LOCALIZATION Left 01/22/2020   Procedure: LEFT BREAST LUMPECTOMY WITH RADIOACTIVE SEED LOCALIZATION;  Surgeon: Belinda Cough, MD;  Location: [facility];  Service: General;  Laterality: Left;  LMA   BREAST SURGERY     CERVICAL ABLATION  03/29/2023   CHOLECYSTECTOMY  ~1996   FOOT NEUROMA SURGERY  ~ 2003   right   RE-EXCISION OF BREAST LUMPECTOMY Left 03/03/2020  Procedure: RE-EXCISION MARGINS OF LEFT BREAST LUMPECTOMY;  Surgeon: Belinda Cough, MD;  Location: [facility];  Service: General;  Laterality: Left;   ROTATOR CUFF REPAIR  ~ 2009   left   SHOULDER ARTHROSCOPY W/ ROTATOR CUFF REPAIR Right 09/21/2011   SKIN CANCER EXCISION     nose, forehead, left leg   SPINE SURGERY     TEE WITHOUT CARDIOVERSION N/A 03/01/2017   Procedure: TRANSESOPHAGEAL ECHOCARDIOGRAM (TEE);  Surgeon: Raford Riggs, MD;  Location: [facility] ENDOSCOPY;  Service: Cardiovascular;  Laterality: N/A;   THYROID  LOBECTOMY  ~ 2006   right   TUBAL LIGATION  ~ 1983   VAGINAL HYSTERECTOMY  ~ 2009   VESICOVAGINAL FISTULA CLOSURE  W/ TAH     Patient Active Problem List   Diagnosis Date Noted   Physical deconditioning 11/24/2023   Benign essential tremor 07/10/2023   Mood disorder 07/10/2023   Intractable chronic migraine without aura 07/10/2023   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   Restless legs syndrome 07/10/2023   Age-related osteoporosis without current pathological fracture 11/29/2020   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   Biventricular cardiac pacemaker in situ 10/08/2018   Long term current use of anticoagulant therapy 10/23/2017   AICD (automatic cardioverter/defibrillator) present    Aortic regurgitation    Nonischemic cardiomyopathy (HCC) 01/21/2011   Idiopathic peripheral neuropathy 08/24/2010   Paroxysmal atrial fibrillation (HCC) 02/23/2010   Raynaud's phenomenon 02/15/2007    PCP: Jerrell Cleatus Ned, MD  REFERRING PROVIDER: Jerrell Cleatus Ned, MD  REFERRING DIAG: [phone] (ICD-10-CM) - Hematoma of right flank, initial encounter G25.81 (ICD-10-CM) - Restless legs syndrome  Rationale for Evaluation and Treatment: Rehabilitation  THERAPY DIAG:  Muscle weakness (generalized)  Abnormal posture  Difficulty in walking, not elsewhere classified  Pain in both upper extremities  ONSET DATE: 11/13/23  SUBJECTIVE:                                                                                                                                                                                           SUBJECTIVE STATEMENT:  My shoulders are hurting.  Sees Neurosurgeon today.       Just recently I have started walking the dog with the cane its a challenge. I'm not standing and walking much. I need to get out of that recliner Previous to this fall she reports she was more mobile but not necessarily active and exercising.  She is gradually increasing her activity in the home and reports she Did 1.5 mile the other day with the dog (9 lb) Arms are sore  bilaterally, not sure why (cane?) she did sprain the elbow.  Her arms are weak.  She does have neck and back issues, takes meds and was prior to the fall.  PERTINENT HISTORY:  Restless leg syndrome Spine surgery A fib Pacemaker  PAIN:  Are you having pain? Yes: NPRS scale: 4/10 Pain location: bilateral shoulders L>R  Pain description: aching  Aggravating factors: early in AM, moving over in bed,  Relieving factors: as the day goes on  Neck hurting not too bad.   Yes: NPRS scale: none /10 Pain location: low back Rt side  Pain description: sore  Aggravating factors: walking up a hill increases pain in back Relieving factors: MHP, medicine- Tylenol    PRECAUTIONS: Fall and ICD/Pacemaker  RED FLAGS: None    WEIGHT BEARING RESTRICTIONS: No  FALLS:  Has patient fallen in last 6 months? Yes. Number of falls 1- was bending   LIVING ENVIRONMENT: Lives with: lives with their spouse Lives in: House/apartment Stairs: Yes: External: 2 steps; can reach both Has following equipment at home: Single point cane and Walker - 2 wheeled rollator is on the way   OCCUPATION: retired   PLOF: Independent with basic ADLs, Independent with household mobility with device, Independent with community mobility with device, Requires assistive device for independence, and Leisure: watching TV, be together   PATIENT GOALS: I want to be rejuvenated and get stronger.   NEXT MD VISIT: upcoming -[facility]   OBJECTIVE:  Note: Objective measures were completed at Evaluation unless otherwise noted.  DIAGNOSTIC FINDINGS:  Imaging: CT head without contrast no acute intracranial normality.  CT cervical spine no acute fracture or traumatic malalignment.  There are chronically opacified in the spine the right posterior ethmoid air cells and the sphenoid sinus, suggesting mucocele.  CT of the thoracic/lumbosacral spine showing postoperative thoracolumbar to sacrum and iliac hardware without CT evidence of acute  complication.  Diffusely decreased bone density.  Right medial soft tissue acute hematoma measuring up to 13 x 6 cm.  No mesenteric or mediastinal hematoma.  PATIENT SURVEYS:  ABC scale: The Activities-Specific Balance Confidence (ABC) Scale TBA   COGNITION: Overall cognitive status: Within functional limits for tasks assessed     SENSATION: WFL  MUSCLE LENGTH: Hamstrings: 45-55 deg PROM   POSTURE: rounded shoulders, forward head, decreased lumbar lordosis, and genu valgus L> R   PALPATION: NT   LUMBAR ROM:   AROM eval  Flexion NT   Extension 75%   Right lateral flexion 90%  Left lateral flexion 90%  Right rotation 50%  Left rotation 50%   (Blank rows = not tested)  LOWER EXTREMITY ROM:   01/09/24 Pain with Rt passive ER and abd   Passive  Right eval Left eval  Hip flexion    Hip extension    Hip abduction    Hip adduction    Hip internal rotation    Hip external rotation    Knee flexion    Knee extension    Ankle dorsiflexion    Ankle plantarflexion    Ankle inversion    Ankle eversion     (Blank rows = not tested)  LOWER EXTREMITY MMT:    MMT Right eval Left eval  Hip flexion 4+ 4+  Hip extension 4- 4-  Hip abduction    Hip adduction    Hip internal rotation    Hip external rotation    Knee flexion 4+ 4+  Knee extension 4+ 4+  Ankle dorsiflexion 4 4+  Ankle plantarflexion    Ankle inversion    Ankle eversion     (  Blank rows = not tested  12/05/23: UE 4-/5 Limited shoulder flexion to 105 deg -110 deg bilateral  Shoulder strength: Rt UE: ER/IR    LUMBAR SPECIAL TESTS:  NT   FUNCTIONAL TESTS:  5 times sit to stand: 16 sec no UEs  2 minute walk test: 375 feet   GAIT: Distance walked: 375  Assistive device utilized: Single point cane Level of assistance: Complete Independence Comments: stiffness noted in trunk, good ability to increase gait speed.   TREATMENT DATE:    [facility] Adult PT Treatment:                                                 DATE: 01/18/24 Therapeutic Exercise: Band pulls Yellow diagonal , ER and chest pull bilateral  AAROM dowel chest press and OH lift x 15 each  Chest press 3 lbs x 15  Sidelying scaption yellow band x 10  Sidelying clam yellow band  Standing achilles/gastrocnemius per her request at wall Stretch off step bilateral x 30 sec x 3 each side    [facility] Adult PT Treatment:                                                DATE: 01/09/24 Therapeutic Exercise: LTR head turns  Band pulls red, yellow 2 x 15 ER red, yellow  2 x 15  AAROM dowel  Bridging with sl post. tilt  x 10  Seated cervical ROM ext/flexion, sidebending and rotation  sit to stand Heel raises Self Care: Discussed gentle strengthening and mobility for cervical and shoulder Talked about osteoporosis as well and falls Importance of building up muscle and bone density with aging and that it is never too late to improve your strength.   [facility] Adult PT Treatment:                                                DATE: 12/26/23 Therapeutic Exercise: Recumbent bike L 2 for 5 min  Standing at wall for posture Shoulder flexion dowel  ER yellow band x 10 x 2  Horizontal abd pull yellow x 15  Row green x 15 Extension green  x 15  Bridge with ball  LTR supine  Horizonal pull x 15     PATIENT EDUCATION:  Education details: Self-care see above Person educated: Patient Education method: Programmer, Multimedia, Facilities Manager, Verbal cues, and Handouts Education comprehension: verbalized understanding and needs further education  HOME EXERCISE PROGRAM:  Access Code: [email or web address] URL: [email or web address] Date: 01/18/2024 Prepared by: Delon Norma  Exercises - Supine Lower Trunk Rotation  - 1 x daily - 7 x weekly - 2 sets - 10 reps - 5-10 hold - Supine Bridge  - 1 x daily - 7 x weekly - 2 sets - 10 reps - 5 hold - Standing Row with Anchored Resistance  - 1 x daily - 7 x weekly - 2 sets - 10 reps - 5 hold - Sit to Stand with  Arm Reach Toward Target  - 1 x daily - 7 x weekly - 2 sets - 10 reps - Shoulder External  Rotation and Scapular Retraction with Resistance  - 1 x daily - 7 x weekly - 2 sets - 10 reps - 5 hold - Standing Shoulder Horizontal Abduction with Resistance  - 1 x daily - 7 x weekly - 2 sets - 10 reps - 5 hold - Gastroc Stretch on Wall  - 1 x daily - 7 x weekly - 1 sets - 3 reps - 30 hold - Standing Bilateral Gastroc Stretch with Step  - 1 x daily - 7 x weekly - 1 sets - 3 reps - 30 hold  ASSESSMENT:  CLINICAL IMPRESSION:  Patient continues to have bilateral shoulder pain which may be a combination of generalized weakness, cervicalgia or from her fall.  We worked on [employer] today for UE strength and mobility  She asked about a Achilles stretch for a sore on her foot and she was able to do this without difficulty. She will see Neurosurgery later today.  She will cont to benefit from skilled PT in order to do improve tolerated for activity and daily living tasks.     OBJECTIVE IMPAIRMENTS: Abnormal gait, decreased activity tolerance, decreased balance, decreased endurance, decreased mobility, difficulty walking, decreased ROM, decreased strength, increased fascial restrictions, impaired flexibility, impaired UE functional use, improper body mechanics, postural dysfunction, and pain.   ACTIVITY LIMITATIONS: carrying, lifting, bending, standing, squatting, sleeping, stairs, transfers, bed mobility, reach over head, locomotion level, and caring for others  PARTICIPATION LIMITATIONS: meal prep, cleaning, laundry, interpersonal relationship, shopping, community activity, and yard work  PERSONAL FACTORS: 3+ comorbidities: Atrial fibrillation spinal fusion osteoporosis are also affecting patient's functional outcome.   REHAB POTENTIAL: Excellent  CLINICAL DECISION MAKING: Stable/uncomplicated  EVALUATION COMPLEXITY: Low   GOALS: Goals reviewed with patient? Yes  SHORT TERM GOALS: Target date:  01/02/2024   Patient will be able to show independence for initial HEP to include posture, core and hip strength and stability.   Baseline: Goal status: Ongoing 2.  Patient will note improved endurance for standing activities up to 15 minutes in her home involving upper body (laundry, cooking)  Baseline:  Goal status: Ongoing 3.  Patient will complete ABC balance assessment and goal set  Baseline:  Goal status: Met   LONG TERM GOALS: Target date: 01/30/2024    Patient will be independent with final HEP upon discharge from PT and report consistent benefit following exercise completion.    Baseline:  Goal status: INITIAL  2.  Patient will improve 5 x STS to 13 sec or less to demo decreased fall risk.   Baseline: 16 sec Goal status: INITIAL  3.  Patient will be able to improve 2-minute walk distance by 50 feet to show improve endurance and gait stability Baseline:  Goal status: Ongoing  4.  Patient will be able to properly and safely perform hip hinge in order to care for her dogs Baseline:  Goal status: Ongoing needs reinforcement  5.  ABC score balance confidence t will improved by 20% baseline: 57% confident Goal status: Ongoing  6.  Patient will be able to complete floor transfer mod independent to reduce burden of care Baseline:  Goal status: Ongoing PLAN:  PT FREQUENCY: 2x/week  PT DURATION: 8 weeks  PLANNED INTERVENTIONS: [id number]- PT Re-evaluation, 97750- Physical Performance Testing, 97110-Therapeutic exercises, 97530- Therapeutic activity, [id number]- Neuromuscular re-education, 97535- Self Care, [id number]- Manual therapy, [phone]- Gait training, Patient/Family education, Balance training, Stair training, Cryotherapy, and Moist heat.  PLAN FOR NEXT SESSION: check home exercise program complete more dynamic balance screen  consider recumbent bike standing/wall exercises   Aria Jarrard, PT 01/18/2024, 2:01 PM  Delon Norma, PT 01/18/2024 2:01 PM Phone: [phone] Fax:  [phone]   "

## 2024-01-23 ENCOUNTER — Encounter: Admitting: Physical Therapy

## 2024-01-24 ENCOUNTER — Other Ambulatory Visit: Payer: Self-pay | Admitting: Student in an Organized Health Care Education/Training Program

## 2024-01-24 DIAGNOSIS — E890 Postprocedural hypothyroidism: Secondary | ICD-10-CM

## 2024-01-25 ENCOUNTER — Encounter: Payer: Self-pay | Admitting: Physical Therapy

## 2024-01-25 ENCOUNTER — Ambulatory Visit: Admitting: Physical Therapy

## 2024-01-25 DIAGNOSIS — R293 Abnormal posture: Secondary | ICD-10-CM | POA: Diagnosis not present

## 2024-01-25 DIAGNOSIS — R262 Difficulty in walking, not elsewhere classified: Secondary | ICD-10-CM | POA: Diagnosis not present

## 2024-01-25 DIAGNOSIS — M6281 Muscle weakness (generalized): Secondary | ICD-10-CM

## 2024-01-25 DIAGNOSIS — M79602 Pain in left arm: Secondary | ICD-10-CM | POA: Diagnosis not present

## 2024-01-25 DIAGNOSIS — M79601 Pain in right arm: Secondary | ICD-10-CM | POA: Diagnosis not present

## 2024-01-25 NOTE — Therapy (Signed)
 " OUTPATIENT PHYSICAL THERAPY NOTE   Patient Name: [name] MRN: [id number] DOB:Jan 23, 1955, 69 y.o., female Today's Date: 01/25/2024  END OF SESSION:  PT End of Session - 01/25/24 1354     Visit Number 6    Number of Visits 16    Authorization Type [employer] MCR    PT Start Time 1348    PT Stop Time 1430    PT Time Calculation (min) 42 min    Activity Tolerance Patient tolerated treatment well    Behavior During Therapy WFL for tasks assessed/performed              Past Medical History:  Diagnosis Date   AF (paroxysmal atrial fibrillation) (HCC)    Allergy    Anxiety    Arthritis    BCC (basal cell carcinoma) 03/11/2003   left distal lower leg ankle-tx dr helga   Biventricular cardiac pacemaker in situ    a. prior CRT-D in 2013 with downgrade to CRT-Pacemaker only in 11/2016.   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 2012: normal coronaries, EF 25-30%. b. improved to 55% 11/2016.   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   in situ- right lower leg-anterior ([id number])   Scoliosis    Splenic  infarction 02/25/2017   Past Surgical History:  Procedure Laterality Date   ARTERY BIOPSY  12/29/2011   Procedure: MINOR BIOPSY TEMPORAL ARTERY;  Surgeon: Morene ONEIDA Olives, MD;  Location: [facility];  Service: General;  Laterality: Right;  Right temporal artery biopsy   BACK SURGERY  12/31/2009   for flat back syndrome; fused bottom of my back   BI-VENTRICULAR IMPLANTABLE CARDIOVERTER DEFIBRILLATOR Left 01/20/2011   Procedure: BI-VENTRICULAR IMPLANTABLE CARDIOVERTER DEFIBRILLATOR  (CRT-D);  Surgeon: Danelle LELON Birmingham, MD;  Location: [facility] CATH LAB;  Service: Cardiovascular;  Laterality: Left;   BIV ICD GENERATOR CHANGEOUT N/A 11/24/2016   Procedure: BIV ICD GENERATOR CHANGEOUT;  Surgeon: Birmingham Danelle LELON, MD;  Location: [facility] INVASIVE CV LAB;  Service: Cardiovascular;  Laterality: N/A;   BREAST LUMPECTOMY WITH RADIOACTIVE SEED LOCALIZATION Left 01/22/2020   Procedure: LEFT BREAST LUMPECTOMY WITH RADIOACTIVE SEED LOCALIZATION;  Surgeon: Belinda Cough, MD;  Location: [facility];  Service: General;  Laterality: Left;  LMA   BREAST SURGERY     CERVICAL ABLATION  03/29/2023   CHOLECYSTECTOMY  ~1996   FOOT NEUROMA SURGERY  ~ 2003   right   RE-EXCISION OF BREAST LUMPECTOMY Left 03/03/2020   Procedure: RE-EXCISION MARGINS OF LEFT BREAST  LUMPECTOMY;  Surgeon: Belinda Cough, MD;  Location: [facility];  Service: General;  Laterality: Left;   ROTATOR CUFF REPAIR  ~ 2009   left   SHOULDER ARTHROSCOPY W/ ROTATOR CUFF REPAIR Right 09/21/2011   SKIN CANCER EXCISION     nose, forehead, left leg   SPINE SURGERY     TEE WITHOUT CARDIOVERSION N/A 03/01/2017   Procedure: TRANSESOPHAGEAL ECHOCARDIOGRAM (TEE);  Surgeon: Raford Riggs, MD;  Location: [facility] ENDOSCOPY;  Service: Cardiovascular;  Laterality: N/A;   THYROID  LOBECTOMY  ~ 2006   right   TUBAL LIGATION  ~ 1983   VAGINAL HYSTERECTOMY  ~ 2009   VESICOVAGINAL FISTULA CLOSURE W/ TAH     Patient Active  Problem List   Diagnosis Date Noted   Physical deconditioning 11/24/2023   Benign essential tremor 07/10/2023   Mood disorder 07/10/2023   Intractable chronic migraine without aura 07/10/2023   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   Restless legs syndrome 07/10/2023   Age-related osteoporosis without current pathological fracture 11/29/2020   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   Biventricular cardiac pacemaker in situ 10/08/2018   Long term current use of anticoagulant therapy 10/23/2017   AICD (automatic cardioverter/defibrillator) present    Aortic regurgitation    Nonischemic cardiomyopathy (HCC) 01/21/2011   Idiopathic peripheral neuropathy 08/24/2010   Paroxysmal atrial fibrillation (HCC) 02/23/2010   Raynaud's phenomenon 02/15/2007    PCP: Jerrell Cleatus Ned, MD  REFERRING PROVIDER: Jerrell Cleatus Ned, MD  REFERRING DIAG: [phone] (ICD-10-CM) - Hematoma of right flank, initial encounter G25.81 (ICD-10-CM) - Restless legs syndrome  Rationale for Evaluation and Treatment: Rehabilitation  THERAPY DIAG:  Muscle weakness (generalized)  Abnormal posture  Difficulty in walking, not elsewhere classified  Pain in both upper extremities  ONSET DATE: 11/13/23  SUBJECTIVE:                                                                                                                                                                                           SUBJECTIVE STATEMENT:  Pain is 6/10, saw Dr. Joshua.  She has to go back in 3 weeks to get the results of XR.  Discussed options for surgery. She plans to go to the pool for PT next month.    Just recently I have started walking the dog with the cane its a challenge. I'm not standing and walking much. I need to get out of that recliner Previous to this fall she reports she was more mobile but not necessarily active and exercising.  She is gradually increasing her activity in the home and  reports she Did 1.5 mile the  other day with the dog (9 lb) Arms are sore bilaterally, not sure why (cane?) she did sprain the elbow.  Her arms are weak.  She does have neck and back issues, takes meds and was prior to the fall.  PERTINENT HISTORY:  Restless leg syndrome Spine surgery A fib Pacemaker  PAIN:  Are you having pain? Yes: NPRS scale: 6/10 Pain location: bilateral shoulders L>R  Pain description: aching  Aggravating factors: early in AM, moving over in bed,  Relieving factors: as the day goes on  Neck hurting not too bad.   Yes: NPRS scale: none /10 Pain location: low back Rt side  Pain description: sore  Aggravating factors: walking up a hill increases pain in back Relieving factors: MHP, medicine- Tylenol    PRECAUTIONS: Fall and ICD/Pacemaker  RED FLAGS: None    WEIGHT BEARING RESTRICTIONS: No  FALLS:  Has patient fallen in last 6 months? Yes. Number of falls 1- was bending   LIVING ENVIRONMENT: Lives with: lives with their spouse Lives in: House/apartment Stairs: Yes: External: 2 steps; can reach both Has following equipment at home: Single point cane and Walker - 2 wheeled rollator is on the way   OCCUPATION: retired   PLOF: Independent with basic ADLs, Independent with household mobility with device, Independent with community mobility with device, Requires assistive device for independence, and Leisure: watching TV, be together   PATIENT GOALS: I want to be rejuvenated and get stronger.   NEXT MD VISIT: upcoming -[facility]   OBJECTIVE:  Note: Objective measures were completed at Evaluation unless otherwise noted.  DIAGNOSTIC FINDINGS:  Imaging: CT head without contrast no acute intracranial normality.  CT cervical spine no acute fracture or traumatic malalignment.  There are chronically opacified in the spine the right posterior ethmoid air cells and the sphenoid sinus, suggesting mucocele.  CT of the thoracic/lumbosacral spine showing  postoperative thoracolumbar to sacrum and iliac hardware without CT evidence of acute complication.  Diffusely decreased bone density.  Right medial soft tissue acute hematoma measuring up to 13 x 6 cm.  No mesenteric or mediastinal hematoma.  PATIENT SURVEYS:  ABC scale: The Activities-Specific Balance Confidence (ABC) Scale TBA   COGNITION: Overall cognitive status: Within functional limits for tasks assessed     SENSATION: WFL  MUSCLE LENGTH: Hamstrings: 45-55 deg PROM   POSTURE: rounded shoulders, forward head, decreased lumbar lordosis, and genu valgus L> R   PALPATION: NT   LUMBAR ROM:   AROM eval  Flexion NT   Extension 75%   Right lateral flexion 90%  Left lateral flexion 90%  Right rotation 50%  Left rotation 50%   (Blank rows = not tested)  LOWER EXTREMITY ROM:   01/09/24 Pain with Rt passive ER and abd   Passive  Right eval Left eval  Hip flexion    Hip extension    Hip abduction    Hip adduction    Hip internal rotation    Hip external rotation    Knee flexion    Knee extension    Ankle dorsiflexion    Ankle plantarflexion    Ankle inversion    Ankle eversion     (Blank rows = not tested)  LOWER EXTREMITY MMT:    MMT Right eval Left eval  Hip flexion 4+ 4+  Hip extension 4- 4-  Hip abduction    Hip adduction    Hip internal rotation    Hip external rotation    Knee flexion 4+ 4+  Knee extension  4+ 4+  Ankle dorsiflexion 4 4+  Ankle plantarflexion    Ankle inversion    Ankle eversion     (Blank rows = not tested  12/05/23: UE 4-/5 Limited shoulder flexion to 105 deg -110 deg bilateral  Shoulder strength: Rt UE: ER/IR    LUMBAR SPECIAL TESTS:  NT   FUNCTIONAL TESTS:  5 times sit to stand: 16 sec no UEs  2 minute walk test: 375 feet   01/25/24: 2 min walk 495 feet with cane , increased pain with last lap in neck    GAIT: Distance walked: 375 feet   Assistive device utilized: Single point cane Level of assistance: Complete  Independence Comments: stiffness noted in trunk, good ability to increase gait speed.   TREATMENT DATE:   [facility] Adult PT Treatment:                                                DATE: 01/25/24 Therapeutic Activity: Wall slides  Corner stretch Wall for posture and strength Chest press with circle hands in then hands outside  Cervical ROM with ball under neck in supine: rotation and flex/extension  2 min walk 495 feet  Proper lifting and body mechanics for grabbing the dog dish or light items Therapeutic Ex:  Red band chest pull and ER   Diagonal pull red band x 10      [facility] Adult PT Treatment:                                                DATE: 01/18/24 Therapeutic Exercise: Band pulls Yellow diagonal , ER and chest pull bilateral  AAROM dowel chest press and OH lift x 15 each  Chest press 3 lbs x 15  Sidelying scaption yellow band x 10  Sidelying clam yellow band  Standing achilles/gastrocnemius per her request at wall Stretch off step bilateral x 30 sec x 3 each side    [facility] Adult PT Treatment:                                                DATE: 01/09/24 Therapeutic Exercise: LTR head turns  Band pulls red, yellow 2 x 15 ER red, yellow  2 x 15  AAROM dowel  Bridging with sl post. tilt  x 10  Seated cervical ROM ext/flexion, sidebending and rotation  sit to stand Heel raises Self Care: Discussed gentle strengthening and mobility for cervical and shoulder Talked about osteoporosis as well and falls Importance of building up muscle and bone density with aging and that it is never too late to improve your strength.   [facility] Adult PT Treatment:                                                DATE: 12/26/23 Therapeutic Exercise: Recumbent bike L 2 for 5 min  Standing at wall for posture Shoulder flexion dowel  ER yellow band x 10 x 2  Horizontal  abd pull yellow x 15  Row green x 15 Extension green  x 15  Bridge with ball  LTR supine  Horizonal pull x 15     PATIENT  EDUCATION:  Education details: Self-care see above Person educated: Patient Education method: Programmer, Multimedia, Facilities Manager, Verbal cues, and Handouts Education comprehension: verbalized understanding and needs further education  HOME EXERCISE PROGRAM:  Access Code: [email or web address] URL: [email or web address] Date: 01/18/2024 Prepared by: Delon Norma  Exercises - Supine Lower Trunk Rotation  - 1 x daily - 7 x weekly - 2 sets - 10 reps - 5-10 hold - Supine Bridge  - 1 x daily - 7 x weekly - 2 sets - 10 reps - 5 hold - Standing Row with Anchored Resistance  - 1 x daily - 7 x weekly - 2 sets - 10 reps - 5 hold - Sit to Stand with Arm Reach Toward Target  - 1 x daily - 7 x weekly - 2 sets - 10 reps - Shoulder External Rotation and Scapular Retraction with Resistance  - 1 x daily - 7 x weekly - 2 sets - 10 reps - 5 hold - Standing Shoulder Horizontal Abduction with Resistance  - 1 x daily - 7 x weekly - 2 sets - 10 reps - 5 hold - Gastroc Stretch on Wall  - 1 x daily - 7 x weekly - 1 sets - 3 reps - 30 hold - Standing Bilateral Gastroc Stretch with Step  - 1 x daily - 7 x weekly - 1 sets - 3 reps - 30 hold  ASSESSMENT:  CLINICAL IMPRESSION:    Patient did consult her neurosurgeon however there was not definitive results about her x-ray and bone density.  She does continue to have neck pain upper body weakness and difficulty turning her head especially to drive.  Patient is interested in trying aquatics after her next 2 sessions which are next week.  I encouraged her to continue supine neck exercises to reduce the strain and improve her upper body strength with her home exercise program.  Patient continues to have bilateral shoulder pain which may be a combination of generalized weakness, cervicalgia or from her fall.  We worked on [employer] today for UE strength and mobility  She asked about a Achilles stretch for a sore on her foot and she was able to do this without difficulty. She will  see Neurosurgery later today.  She will cont to benefit from skilled PT in order to do improve tolerated for activity and daily living tasks.     OBJECTIVE IMPAIRMENTS: Abnormal gait, decreased activity tolerance, decreased balance, decreased endurance, decreased mobility, difficulty walking, decreased ROM, decreased strength, increased fascial restrictions, impaired flexibility, impaired UE functional use, improper body mechanics, postural dysfunction, and pain.   ACTIVITY LIMITATIONS: carrying, lifting, bending, standing, squatting, sleeping, stairs, transfers, bed mobility, reach over head, locomotion level, and caring for others  PARTICIPATION LIMITATIONS: meal prep, cleaning, laundry, interpersonal relationship, shopping, community activity, and yard work  PERSONAL FACTORS: 3+ comorbidities: Atrial fibrillation spinal fusion osteoporosis are also affecting patient's functional outcome.   REHAB POTENTIAL: Excellent  CLINICAL DECISION MAKING: Stable/uncomplicated  EVALUATION COMPLEXITY: Low   GOALS: Goals reviewed with patient? Yes  SHORT TERM GOALS: Target date: 01/02/2024   Patient will be able to show independence for initial HEP to include posture, core and hip strength and stability.   Baseline: Goal status: MET   2.  Patient will note improved endurance for standing activities up to 15  minutes in her home involving upper body (laundry, cooking)  Baseline:  Goal status: ongoing   3.  Patient will complete ABC balance assessment and goal set  Baseline:  Goal status:MET    LONG TERM GOALS: Target date: 01/30/2024    Patient will be independent with final HEP upon discharge from PT and report consistent benefit following exercise completion.    Baseline:  Goal status: ongoing   2.  Patient will improve 5 x STS to 13 sec or less to demo decreased fall risk.   Baseline: 16 sec Goal status: INITIAL  3.  Patient will be able to improve 2-minute walk distance by 50  feet to show improve endurance and gait stability Baseline:  Goal status: Ongoing  4.  Patient will be able to properly and safely perform hip hinge in order to care for her dogs Baseline:  Goal status: Ongoing needs reinforcement  5.  ABC score balance confidence to will improved by 20% baseline: 57% confident Goal status: Ongoing  6.  Patient will be able to complete floor transfer mod independent to reduce burden of care Baseline:  Goal status: Ongoing PLAN:  PT FREQUENCY: 2x/week  PT DURATION: 8 weeks  PLANNED INTERVENTIONS: [id number]- PT Re-evaluation, 97750- Physical Performance Testing, 97110-Therapeutic exercises, 97530- Therapeutic activity, [id number]- Neuromuscular re-education, 97535- Self Care, [id number]- Manual therapy, [phone]- Gait training, Patient/Family education, Balance training, Stair training, Cryotherapy, and Moist heat.  PLAN FOR NEXT SESSION: Continue to discuss hinging and bending down to reach items without increasing risk of falling or fracture.  Continue upper body strengthening exercises for posture   Barrie Wale, PT 01/25/2024, 1:59 PM  Delon Norma, PT 01/25/24 1:59 PM Phone: [phone] Fax: [phone]   "

## 2024-01-29 ENCOUNTER — Ambulatory Visit: Admitting: Physical Therapy

## 2024-01-29 ENCOUNTER — Encounter: Payer: Self-pay | Admitting: Physical Therapy

## 2024-01-29 DIAGNOSIS — R293 Abnormal posture: Secondary | ICD-10-CM | POA: Diagnosis not present

## 2024-01-29 DIAGNOSIS — M6281 Muscle weakness (generalized): Secondary | ICD-10-CM | POA: Diagnosis not present

## 2024-01-29 DIAGNOSIS — R262 Difficulty in walking, not elsewhere classified: Secondary | ICD-10-CM | POA: Diagnosis not present

## 2024-01-29 DIAGNOSIS — M79602 Pain in left arm: Secondary | ICD-10-CM

## 2024-01-29 DIAGNOSIS — M79601 Pain in right arm: Secondary | ICD-10-CM

## 2024-01-29 NOTE — Therapy (Signed)
 " OUTPATIENT PHYSICAL THERAPY NOTE   Patient Name: [name] MRN: [id number] DOB:Jan 26, 1955, 69 y.o., female Today's Date: 01/29/2024  END OF SESSION:  PT End of Session - 01/29/24 1226     Visit Number 7    Number of Visits 16    Date for Recertification  01/30/24    Authorization Type [employer] MCR    PT Start Time 1223    PT Stop Time 1304    PT Time Calculation (min) 41 min    Activity Tolerance Patient tolerated treatment well    Behavior During Therapy WFL for tasks assessed/performed               Past Medical History:  Diagnosis Date   AF (paroxysmal atrial fibrillation) (HCC)    Allergy    Anxiety    Arthritis    BCC (basal cell carcinoma) 03/11/2003   left distal lower leg ankle-tx dr helga   Biventricular cardiac pacemaker in situ    a. prior CRT-D in 2013 with downgrade to CRT-Pacemaker only in 11/2016.   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 2012: normal coronaries, EF 25-30%. b. improved to 55% 11/2016.   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   Past Surgical History:  Procedure Laterality Date   ARTERY BIOPSY  12/29/2011   Procedure: MINOR BIOPSY TEMPORAL ARTERY;  Surgeon: Morene ONEIDA Olives, MD;  Location: [facility];  Service: General;  Laterality: Right;  Right temporal artery biopsy   BACK SURGERY  12/31/2009   for flat back syndrome; fused bottom of my back   BI-VENTRICULAR IMPLANTABLE CARDIOVERTER DEFIBRILLATOR Left 01/20/2011   Procedure: BI-VENTRICULAR IMPLANTABLE CARDIOVERTER DEFIBRILLATOR  (CRT-D);  Surgeon: Danelle LELON Birmingham, MD;  Location: [facility] CATH LAB;  Service: Cardiovascular;  Laterality: Left;   BIV ICD GENERATOR CHANGEOUT N/A 11/24/2016   Procedure: BIV ICD GENERATOR CHANGEOUT;  Surgeon: Birmingham Danelle LELON, MD;  Location: [facility] INVASIVE CV LAB;  Service: Cardiovascular;  Laterality: N/A;   BREAST LUMPECTOMY WITH RADIOACTIVE SEED LOCALIZATION Left 01/22/2020   Procedure: LEFT BREAST LUMPECTOMY WITH RADIOACTIVE SEED LOCALIZATION;  Surgeon: Belinda Cough, MD;  Location: [facility];  Service: General;  Laterality: Left;  LMA   BREAST SURGERY     CERVICAL ABLATION  03/29/2023   CHOLECYSTECTOMY  ~1996   FOOT NEUROMA SURGERY  ~ 2003   right   RE-EXCISION OF BREAST LUMPECTOMY Left  03/03/2020   Procedure: RE-EXCISION MARGINS OF LEFT BREAST LUMPECTOMY;  Surgeon: Belinda Cough, MD;  Location: [facility];  Service: General;  Laterality: Left;   ROTATOR CUFF REPAIR  ~ 2009   left   SHOULDER ARTHROSCOPY W/ ROTATOR CUFF REPAIR Right 09/21/2011   SKIN CANCER EXCISION     nose, forehead, left leg   SPINE SURGERY     TEE WITHOUT CARDIOVERSION N/A 03/01/2017   Procedure: TRANSESOPHAGEAL ECHOCARDIOGRAM (TEE);  Surgeon: Raford Riggs, MD;  Location[facility]OSCOPY;  Service: Cardiovascular;  Laterality: N/A;   THYROID  LOBECTOMY  ~ 2006   right   TUBAL LIGATION  ~ 1983   VAGINAL HYSTERECTOMY  ~ 2009   VESICOVAGINAL FISTULA CLOSURE  W/ TAH     Patient Active Problem List   Diagnosis Date Noted   Physical deconditioning 11/24/2023   Benign essential tremor 07/10/2023   Mood disorder 07/10/2023   Intractable chronic migraine without aura 07/10/2023   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   Restless legs syndrome 07/10/2023   Age-related osteoporosis without current pathological fracture 11/29/2020   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   Biventricular cardiac pacemaker in situ 10/08/2018   Long term current use of anticoagulant therapy 10/23/2017   AICD (automatic cardioverter/defibrillator) present    Aortic regurgitation    Nonischemic cardiomyopathy (HCC) 01/21/2011   Idiopathic peripheral neuropathy 08/24/2010   Paroxysmal atrial fibrillation (HCC) 02/23/2010   Raynaud's phenomenon 02/15/2007    PCP: Jerrell Cleatus Ned, MD  REFERRING PROVIDER: Jerrell Cleatus Ned, MD  REFERRING DIAG: [id number] (ICD-10-CM) - Hematoma of right flank, initial encounter G25.81 (ICD-10-CM) - Restless legs syndrome  Rationale for Evaluation and Treatment: Rehabilitation  THERAPY DIAG:  Muscle weakness (generalized)  Abnormal posture  Difficulty in walking, not elsewhere classified  Pain in both upper extremities  ONSET DATE: 11/13/23  SUBJECTIVE:                                                                                                                                                                                           SUBJECTIVE STATEMENT:  Pain is 6/10, saw Dr. Joshua.  She has to go back in 3 weeks to get the results of XR.  Discussed options for surgery. She plans to go to the pool for PT next month.    Just recently I have started walking the dog with the cane its a challenge. I'm not standing and walking much. I need to get out of that recliner Previous to this fall she reports she was more mobile but not necessarily active and exercising.  She is gradually increasing her  activity in  the home and reports she Did 1.5 mile the other day with the dog (9 lb) Arms are sore bilaterally, not sure why (cane?) she did sprain the elbow.  Her arms are weak.  She does have neck and back issues, takes meds and was prior to the fall.  PERTINENT HISTORY:  Restless leg syndrome Spine surgery A fib Pacemaker  PAIN:  Are you having pain? Yes: NPRS scale: 6/10 Pain location: bilateral shoulders L>R  Pain description: aching  Aggravating factors: early in AM, moving over in bed,  Relieving factors: as the day goes on  Neck hurting not too bad.   Yes: NPRS scale: none /10 Pain location: low back Rt side  Pain description: sore  Aggravating factors: walking up a hill increases pain in back Relieving factors: MHP, medicine- Tylenol    PRECAUTIONS: Fall and ICD/Pacemaker  RED FLAGS: None    WEIGHT BEARING RESTRICTIONS: No  FALLS:  Has patient fallen in last 6 months? Yes. Number of falls 1- was bending   LIVING ENVIRONMENT: Lives with: lives with their spouse Lives in: House/apartment Stairs: Yes: External: 2 steps; can reach both Has following equipment at home: Single point cane and Walker - 2 wheeled rollator is on the way   OCCUPATION: retired   PLOF: Independent with basic ADLs, Independent with household mobility with device, Independent with community mobility with device, Requires assistive device for independence, and Leisure: watching TV, be together   PATIENT GOALS: I want to be rejuvenated and get stronger.   NEXT MD VISIT: upcoming -[facility]   OBJECTIVE:  Note: Objective measures were completed at Evaluation unless otherwise noted.  DIAGNOSTIC FINDINGS:  Imaging: CT head without contrast no acute intracranial normality.  CT cervical spine no acute fracture or traumatic malalignment.  There are chronically opacified in the spine the right posterior ethmoid air cells and the sphenoid sinus, suggesting mucocele.  CT of the thoracic/lumbosacral  spine showing postoperative thoracolumbar to sacrum and iliac hardware without CT evidence of acute complication.  Diffusely decreased bone density.  Right medial soft tissue acute hematoma measuring up to 13 x 6 cm.  No mesenteric or mediastinal hematoma.  PATIENT SURVEYS:  ABC scale: The Activities-Specific Balance Confidence (ABC) Scale TBA   COGNITION: Overall cognitive status: Within functional limits for tasks assessed     SENSATION: WFL  MUSCLE LENGTH: Hamstrings: 45-55 deg PROM   POSTURE: rounded shoulders, forward head, decreased lumbar lordosis, and genu valgus L> R   PALPATION: NT   LUMBAR ROM:   AROM eval  Flexion NT   Extension 75%   Right lateral flexion 90%  Left lateral flexion 90%  Right rotation 50%  Left rotation 50%   (Blank rows = not tested)  LOWER EXTREMITY ROM:   01/09/24 Pain with Rt passive ER and abd   Passive  Right eval Left eval  Hip flexion    Hip extension    Hip abduction    Hip adduction    Hip internal rotation    Hip external rotation    Knee flexion    Knee extension    Ankle dorsiflexion    Ankle plantarflexion    Ankle inversion    Ankle eversion     (Blank rows = not tested)  LOWER EXTREMITY MMT:    MMT Right eval Left eval  Hip flexion 4+ 4+  Hip extension 4- 4-  Hip abduction    Hip adduction    Hip internal rotation    Hip external rotation  Knee flexion 4+ 4+  Knee extension 4+ 4+  Ankle dorsiflexion 4 4+  Ankle plantarflexion    Ankle inversion    Ankle eversion     (Blank rows = not tested  12/05/23: UE 4-/5 Limited shoulder flexion to 105 deg -110 deg bilateral  Shoulder strength: Rt UE: ER/IR    LUMBAR SPECIAL TESTS:  NT   FUNCTIONAL TESTS:  5 times sit to stand: 16 sec no UEs  2 minute walk test: 375 feet   01/25/24: 2 min walk 495 feet with cane , increased pain with last lap in neck    GAIT: Distance walked: 375 feet   Assistive device utilized: Single point cane Level of  assistance: Complete Independence Comments: stiffness noted in trunk, good ability to increase gait speed.   TREATMENT DATE:    [facility] Adult PT Treatment:                                                DATE: 01/29/24 Therapeutic Exercise: Seated pulleys overhead, open/close Horizontal abduction red band  External rotation red band  Row green band  Supine AROM bilateral shoulders chest press cane  Overhead lift x 10 cane  Flexion bilateral shoulders red band x 10 (anchored around thighs) Extension green (PT anchor above)  Supine head cervical ROM rotation and chin tuck  Supported bridges legs on bolster   [facility] Adult PT Treatment:                                                DATE: 01/25/24 Therapeutic Activity: Wall slides  Corner stretch Wall for posture and strength Chest press with circle hands in then hands outside  Cervical ROM with ball under neck in supine: rotation and flex/extension  2 min walk 495 feet  Proper lifting and body mechanics for grabbing the dog dish or light items Therapeutic Ex:  Red band chest pull and ER   Diagonal pull red band x 10      [facility] Adult PT Treatment:                                                DATE: 01/18/24 Therapeutic Exercise: Band pulls Yellow diagonal , ER and chest pull bilateral  AAROM dowel chest press and OH lift x 15 each  Chest press 3 lbs x 15  Sidelying scaption yellow band x 10  Sidelying clam yellow band  Standing achilles/gastrocnemius per her request at wall Stretch off step bilateral x 30 sec x 3 each side      PATIENT EDUCATION:  Education details: Self-care see above Person educated: Patient Education method: Programmer, Multimedia, Facilities Manager, Verbal cues, and Handouts Education comprehension: verbalized understanding and needs further education  HOME EXERCISE PROGRAM:  Access Code: [email or web address] URL: [email or web address] Date: 01/18/2024 Prepared by: Delon Norma  Exercises - Supine Lower Trunk  Rotation  - 1 x daily - 7 x weekly - 2 sets - 10 reps - 5-10 hold - Supine Bridge  - 1 x daily - 7 x weekly - 2 sets - 10 reps - 5 hold - Standing  Row with Anchored Resistance  - 1 x daily - 7 x weekly - 2 sets - 10 reps - 5 hold - Sit to Stand with Arm Reach Toward Target  - 1 x daily - 7 x weekly - 2 sets - 10 reps - Shoulder External Rotation and Scapular Retraction with Resistance  - 1 x daily - 7 x weekly - 2 sets - 10 reps - 5 hold - Standing Shoulder Horizontal Abduction with Resistance  - 1 x daily - 7 x weekly - 2 sets - 10 reps - 5 hold - Gastroc Stretch on Wall  - 1 x daily - 7 x weekly - 1 sets - 3 reps - 30 hold - Standing Bilateral Gastroc Stretch with Step  - 1 x daily - 7 x weekly - 1 sets - 3 reps - 30 hold  ASSESSMENT:  CLINICAL IMPRESSION:    Patient with bilateral shoulder and neck pain today.  She was able to do exercises today in sitting and supine with the neck supported.    Patient continues to have bilateral shoulder pain which may be a combination of generalized weakness, cervicalgia or from her fall.  We worked on [employer] today for UE strength and mobility  She asked about a Achilles stretch for a sore on her foot and she was able to do this without difficulty. She will see Neurosurgery later today.  She will cont to benefit from skilled PT in order to do improve tolerated for activity and daily living tasks.     OBJECTIVE IMPAIRMENTS: Abnormal gait, decreased activity tolerance, decreased balance, decreased endurance, decreased mobility, difficulty walking, decreased ROM, decreased strength, increased fascial restrictions, impaired flexibility, impaired UE functional use, improper body mechanics, postural dysfunction, and pain.   ACTIVITY LIMITATIONS: carrying, lifting, bending, standing, squatting, sleeping, stairs, transfers, bed mobility, reach over head, locomotion level, and caring for others  PARTICIPATION LIMITATIONS: meal prep, cleaning, laundry,  interpersonal relationship, shopping, community activity, and yard work  PERSONAL FACTORS: 3+ comorbidities: Atrial fibrillation spinal fusion osteoporosis are also affecting patient's functional outcome.   REHAB POTENTIAL: Excellent  CLINICAL DECISION MAKING: Stable/uncomplicated  EVALUATION COMPLEXITY: Low   GOALS: Goals reviewed with patient? Yes  SHORT TERM GOALS: Target date: 01/02/2024   Patient will be able to show independence for initial HEP to include posture, core and hip strength and stability.   Baseline: Goal status: MET   2.  Patient will note improved endurance for standing activities up to 15 minutes in her home involving upper body (laundry, cooking)  Baseline:  Goal status: ongoing   3.  Patient will complete ABC balance assessment and goal set  Baseline:  Goal status:MET    LONG TERM GOALS: Target date: 01/30/2024    Patient will be independent with final HEP upon discharge from PT and report consistent benefit following exercise completion.    Baseline:  Goal status: ongoing   2.  Patient will improve 5 x STS to 13 sec or less to demo decreased fall risk.   Baseline: 16 sec Goal status: INITIAL  3.  Patient will be able to improve 2-minute walk distance by 50 feet to show improve endurance and gait stability Baseline:  Goal status: Ongoing  4.  Patient will be able to properly and safely perform hip hinge in order to care for her dogs Baseline:  Goal status: Ongoing needs reinforcement  5.  ABC score balance confidence to will improved by 20% baseline: 57% confident Goal status: Ongoing  6.  Patient will be able to complete floor transfer mod independent to reduce burden of care Baseline:  Goal status: Ongoing PLAN:  PT FREQUENCY: 2x/week  PT DURATION: 8 weeks  PLANNED INTERVENTIONS: [id number]- PT Re-evaluation, 97750- Physical Performance Testing, 97110-Therapeutic exercises, 97530- Therapeutic activity, [id number]- Neuromuscular re-education,  97535- Self Care, [id number]- Manual therapy, [phone]- Gait training, Patient/Family education, Balance training, Stair training, Cryotherapy, and Moist heat.  PLAN FOR NEXT SESSION: Check in with goals.  Review hinging and bending down.  Final check of upper body strength range of motion and cervical range of motion as well.  Recheck confidence score.  Patient to continue with aquatics if able.     Fahima Cifelli, PT 01/29/2024, 5:14 PM  Delon Norma, PT 01/29/24 5:14 PM Phone: [phone] Fax: [phone]   "

## 2024-01-30 ENCOUNTER — Ambulatory Visit: Admitting: Physical Therapy

## 2024-01-30 ENCOUNTER — Encounter: Payer: Self-pay | Admitting: Physical Therapy

## 2024-01-30 DIAGNOSIS — M79601 Pain in right arm: Secondary | ICD-10-CM

## 2024-01-30 DIAGNOSIS — R262 Difficulty in walking, not elsewhere classified: Secondary | ICD-10-CM

## 2024-01-30 DIAGNOSIS — M79602 Pain in left arm: Secondary | ICD-10-CM | POA: Diagnosis not present

## 2024-01-30 DIAGNOSIS — M6281 Muscle weakness (generalized): Secondary | ICD-10-CM | POA: Diagnosis not present

## 2024-01-30 DIAGNOSIS — R293 Abnormal posture: Secondary | ICD-10-CM

## 2024-01-30 NOTE — Therapy (Unsigned)
 " OUTPATIENT PHYSICAL THERAPY NOTE DISCHARGE    Patient Name: [name] MRN: [id number] DOB:04-08-1955, 69 y.o., female Today's Date: 01/31/2024  END OF SESSION:  PT End of Session - 01/30/24 1308     Visit Number 8    Number of Visits 16    Date for Recertification  01/30/24    Authorization Type [employer] MCR    PT Start Time 1307    PT Stop Time 1350    PT Time Calculation (min) 43 min    Activity Tolerance Patient tolerated treatment well    Behavior During Therapy WFL for tasks assessed/performed               Past Medical History:  Diagnosis Date   AF (paroxysmal atrial fibrillation) (HCC)    Allergy    Anxiety    Arthritis    BCC (basal cell carcinoma) 03/11/2003   left distal lower leg ankle-tx dr helga   Biventricular cardiac pacemaker in situ    a. prior CRT-D in 2013 with downgrade to CRT-Pacemaker only in 11/2016.   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 2012: normal coronaries, EF 25-30%. b. improved to 55% 11/2016.   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   Past Surgical History:  Procedure Laterality Date   ARTERY BIOPSY  12/29/2011   Procedure: MINOR BIOPSY TEMPORAL ARTERY;  Surgeon: Morene ONEIDA Olives, MD;  Location: [facility];  Service: General;  Laterality: Right;  Right temporal artery biopsy   BACK SURGERY  12/31/2009   for flat back syndrome; fused bottom of my back   BI-VENTRICULAR IMPLANTABLE CARDIOVERTER DEFIBRILLATOR Left 01/20/2011   Procedure: BI-VENTRICULAR IMPLANTABLE CARDIOVERTER DEFIBRILLATOR  (CRT-D);  Surgeon: Danelle LELON Birmingham, MD;  Location: [facility] CATH LAB;  Service: Cardiovascular;  Laterality: Left;   BIV ICD GENERATOR CHANGEOUT N/A 11/24/2016   Procedure: BIV ICD GENERATOR CHANGEOUT;  Surgeon: Birmingham Danelle LELON, MD;  Location: [facility] INVASIVE CV LAB;  Service: Cardiovascular;  Laterality: N/A;   BREAST LUMPECTOMY WITH RADIOACTIVE SEED LOCALIZATION Left 01/22/2020   Procedure: LEFT BREAST LUMPECTOMY WITH RADIOACTIVE SEED LOCALIZATION;  Surgeon: Belinda Cough, MD;  Location: [facility];  Service: General;  Laterality: Left;  LMA   BREAST SURGERY     CERVICAL ABLATION  03/29/2023   CHOLECYSTECTOMY  ~1996   FOOT NEUROMA SURGERY  ~ 2003   right   RE-EXCISION OF BREAST  LUMPECTOMY Left 03/03/2020   Procedure: RE-EXCISION MARGINS OF LEFT BREAST LUMPECTOMY;  Surgeon: Belinda Cough, MD;  Location: [facility];  Service: General;  Laterality: Left;   ROTATOR CUFF REPAIR  ~ 2009   left   SHOULDER ARTHROSCOPY W/ ROTATOR CUFF REPAIR Right 09/21/2011   SKIN CANCER EXCISION     nose, forehead, left leg   SPINE SURGERY     TEE WITHOUT CARDIOVERSION N/A 03/01/2017   Procedure: TRANSESOPHAGEAL ECHOCARDIOGRAM (TEE);  Surgeon: Raford Riggs, MD;  Location: [facility] ENDOSCOPY;  Service: Cardiovascular;  Laterality: N/A;   THYROID  LOBECTOMY  ~ 2006   right   TUBAL LIGATION  ~ 1983   VAGINAL HYSTERECTOMY  ~ 2009   VESICOVAGINAL  FISTULA CLOSURE W/ TAH     Patient Active Problem List   Diagnosis Date Noted   Physical deconditioning 11/24/2023   Benign essential tremor 07/10/2023   Mood disorder 07/10/2023   Intractable chronic migraine without aura 07/10/2023   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   Restless legs syndrome 07/10/2023   Age-related osteoporosis without current pathological fracture 11/29/2020   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   Biventricular cardiac pacemaker in situ 10/08/2018   Long term current use of anticoagulant therapy 10/23/2017   AICD (automatic cardioverter/defibrillator) present    Aortic regurgitation    Nonischemic cardiomyopathy (HCC) 01/21/2011   Idiopathic peripheral neuropathy 08/24/2010   Paroxysmal atrial fibrillation (HCC) 02/23/2010   Raynaud's phenomenon 02/15/2007    PCP: Jerrell Cleatus Ned, MD  REFERRING PROVIDER: Jerrell Cleatus Ned, MD  REFERRING DIAG: [phone] (ICD-10-CM) - Hematoma of right flank, initial encounter G25.81 (ICD-10-CM) - Restless legs syndrome  Rationale for Evaluation and Treatment: Rehabilitation  THERAPY DIAG:  Muscle weakness (generalized)  Abnormal posture  Difficulty in walking, not elsewhere classified  Pain in both upper extremities  ONSET DATE: 11/13/23  SUBJECTIVE:                                                                                                                                                                                           SUBJECTIVE STATEMENT:  Feel a bit better today.  OK for discharge today, she has limited relief of symptoms overall.    Just recently I have started walking the dog with the cane its a challenge. I'm not standing and walking much. I need to get out of that recliner Previous to this fall she reports she was more mobile but not necessarily active and exercising.  She is gradually increasing her activity in the home and reports she Did 1.5 mile the  other day with the dog (9 lb) Arms are  sore bilaterally, not sure why (cane?) she did sprain the elbow.  Her arms are weak.  She does have neck and back issues, takes meds and was prior to the fall.  PERTINENT HISTORY:  Restless leg syndrome Spine surgery A fib Pacemaker  PAIN:  Are you having pain? Yes: NPRS scale: 6/10 Pain location: bilateral shoulders L>R  Pain description: aching  Aggravating factors: early in AM, moving over in bed,  Relieving factors: as the day goes on  Neck hurting not too bad.   Yes: NPRS scale: none /10 Pain location: low back Rt side  Pain description: sore  Aggravating factors: walking up a hill increases pain in back Relieving factors: MHP, medicine- Tylenol    PRECAUTIONS: Fall and ICD/Pacemaker  RED FLAGS: None    WEIGHT BEARING RESTRICTIONS: No  FALLS:  Has patient fallen in last 6 months? Yes. Number of falls 1- was bending   LIVING ENVIRONMENT: Lives with: lives with their spouse Lives in: House/apartment Stairs: Yes: External: 2 steps; can reach both Has following equipment at home: Single point cane and Walker - 2 wheeled rollator is on the way   OCCUPATION: retired   PLOF: Independent with basic ADLs, Independent with household mobility with device, Independent with community mobility with device, Requires assistive device for independence, and Leisure: watching TV, be together   PATIENT GOALS: I want to be rejuvenated and get stronger.   NEXT MD VISIT: upcoming -[facility]   OBJECTIVE:  Note: Objective measures were completed at Evaluation unless otherwise noted.  DIAGNOSTIC FINDINGS:  Imaging: CT head without contrast no acute intracranial normality.  CT cervical spine no acute fracture or traumatic malalignment.  There are chronically opacified in the spine the right posterior ethmoid air cells and the sphenoid sinus, suggesting mucocele.  CT of the thoracic/lumbosacral spine showing postoperative thoracolumbar to sacrum and  iliac hardware without CT evidence of acute complication.  Diffusely decreased bone density.  Right medial soft tissue acute hematoma measuring up to 13 x 6 cm.  No mesenteric or mediastinal hematoma.  PATIENT SURVEYS:  ABC scale: The Activities-Specific Balance Confidence (ABC) Scale TBA   COGNITION: Overall cognitive status: Within functional limits for tasks assessed     SENSATION: WFL  MUSCLE LENGTH: Hamstrings: 45-55 deg PROM   POSTURE: rounded shoulders, forward head, decreased lumbar lordosis, and genu valgus L> R   PALPATION: NT   LUMBAR ROM:   AROM eval  Flexion NT   Extension 75%   Right lateral flexion 90%  Left lateral flexion 90%  Right rotation 50%  Left rotation 50%   (Blank rows = not tested)  LOWER EXTREMITY ROM:   01/09/24 Pain with Rt passive ER and abd   Passive  Right eval Left eval  Hip flexion    Hip extension    Hip abduction    Hip adduction    Hip internal rotation    Hip external rotation    Knee flexion    Knee extension    Ankle dorsiflexion    Ankle plantarflexion    Ankle inversion    Ankle eversion     (Blank rows = not tested)  LOWER EXTREMITY MMT:    MMT Right eval Left eval  Hip flexion 4+ 4+  Hip extension 4- 4-  Hip abduction    Hip adduction    Hip internal rotation    Hip external rotation    Knee flexion 4+ 4+  Knee extension 4+ 4+  Ankle dorsiflexion 4 4+  Ankle  plantarflexion    Ankle inversion    Ankle eversion     (Blank rows = not tested  12/05/23: UE 4-/5 Limited shoulder flexion to 105 deg -110 deg bilateral  Shoulder strength: Rt UE: ER/IR    LUMBAR SPECIAL TESTS:  NT   FUNCTIONAL TESTS:  5 times sit to stand: 16 sec no UEs  2 minute walk test: 375 feet   01/25/24: 2 min walk 495 feet with cane , increased pain with last lap in neck    GAIT: Distance walked: 375 feet   Assistive device utilized: Single point cane Level of assistance: Complete Independence Comments: stiffness noted in  trunk, good ability to increase gait speed.   TREATMENT DATE:   [facility] Adult PT Treatment:                                                DATE: 01/30/24 Therapeutic Exercise: Supine chest press 4 lbs  Shoulder extension green band  Shoulder flexion red supine  Horizontal abd red band  Sit to stand x 5  Practiced in gym to target  Standing hip abduction bilateral  Therapeutic Activity: Floor transfer with mod A, increased time. Rt UE limited due to pain and weakness.   Self Care: Hip hinge, lifting   Goals, ABC   [facility] Adult PT Treatment:                                                DATE: 01/29/24 Therapeutic Exercise: Seated pulleys overhead, open/close Horizontal abduction red band  External rotation red band  Row green band  Supine AROM bilateral shoulders chest press cane  Overhead lift x 10 cane  Flexion bilateral shoulders red band x 10 (anchored around thighs) Extension green (PT anchor above)  Supine head cervical ROM rotation and chin tuck  Supported bridges legs on bolster   [facility] Adult PT Treatment:                                                DATE: 01/25/24 Therapeutic Activity: Wall slides  Corner stretch Wall for posture and strength Chest press with circle hands in then hands outside  Cervical ROM with ball under neck in supine: rotation and flex/extension  2 min walk 495 feet  Proper lifting and body mechanics for grabbing the dog dish or light items Therapeutic Ex:  Red band chest pull and ER   Diagonal pull red band x 10      [facility] Adult PT Treatment:                                                DATE: 01/18/24 Therapeutic Exercise: Band pulls Yellow diagonal , ER and chest pull bilateral  AAROM dowel chest press and OH lift x 15 each  Chest press 3 lbs x 15  Sidelying scaption yellow band x 10  Sidelying clam yellow band  Standing achilles/gastrocnemius per her request at wall Stretch off  step bilateral x 30 sec x 3 each side      PATIENT  EDUCATION:  Education details: Self-care see above Person educated: Patient Education method: Programmer, Multimedia, Demonstration, Verbal cues, and Handouts Education comprehension: verbalized understanding and needs further education  HOME EXERCISE PROGRAM:  Access Code: [email or web address] URL: [email or web address] Date: 01/18/2024 Prepared by: Delon Norma  Exercises - Supine Lower Trunk Rotation  - 1 x daily - 7 x weekly - 2 sets - 10 reps - 5-10 hold - Supine Bridge  - 1 x daily - 7 x weekly - 2 sets - 10 reps - 5 hold - Standing Row with Anchored Resistance  - 1 x daily - 7 x weekly - 2 sets - 10 reps - 5 hold - Sit to Stand with Arm Reach Toward Target  - 1 x daily - 7 x weekly - 2 sets - 10 reps - Shoulder External Rotation and Scapular Retraction with Resistance  - 1 x daily - 7 x weekly - 2 sets - 10 reps - 5 hold - Standing Shoulder Horizontal Abduction with Resistance  - 1 x daily - 7 x weekly - 2 sets - 10 reps - 5 hold - Gastroc Stretch on Wall  - 1 x daily - 7 x weekly - 1 sets - 3 reps - 30 hold - Standing Bilateral Gastroc Stretch with Step  - 1 x daily - 7 x weekly - 1 sets - 3 reps - 30 hold  ASSESSMENT:  CLINICAL IMPRESSION:   Patient will finish PT today.  She may do aquatics at the recommendations of her MD. She reports being more confident in her mobility, gait as evidenced by her ABC score.  See below.  It is therapy was focused more on her balance and recovery from this fall but she is really more limited by neck pain or weakness.  She may return in the future.  She did require increased time for floor transfer moderate assistance to come up , relies on upper body .    OBJECTIVE IMPAIRMENTS: Abnormal gait, decreased activity tolerance, decreased balance, decreased endurance, decreased mobility, difficulty walking, decreased ROM, decreased strength, increased fascial restrictions, impaired flexibility, impaired UE functional use, improper body mechanics, postural  dysfunction, and pain.   ACTIVITY LIMITATIONS: carrying, lifting, bending, standing, squatting, sleeping, stairs, transfers, bed mobility, reach over head, locomotion level, and caring for others  PARTICIPATION LIMITATIONS: meal prep, cleaning, laundry, interpersonal relationship, shopping, community activity, and yard work  PERSONAL FACTORS: 3+ comorbidities: Atrial fibrillation spinal fusion osteoporosis are also affecting patient's functional outcome.   REHAB POTENTIAL: Excellent  CLINICAL DECISION MAKING: Stable/uncomplicated  EVALUATION COMPLEXITY: Low   GOALS: Goals reviewed with patient? Yes  SHORT TERM GOALS: Target date: 01/02/2024   Patient will be able to show independence for initial HEP to include posture, core and hip strength and stability.   Baseline: Goal status: MET   2.  Patient will note improved endurance for standing activities up to 15 minutes in her home involving upper body (laundry, cooking)  Baseline:  Goal status: ongoing   3.  Patient will complete ABC balance assessment and goal set  Baseline:  Goal status:MET    LONG TERM GOALS: Target date: 01/30/2024    Patient will be independent with final HEP upon discharge from PT and report consistent benefit following exercise completion.    Baseline:  Goal status:MET   2.  Patient will improve 5 x STS to 13 sec or less to demo decreased fall risk.  Baseline: 16 sec Goal status:unmet, about the same   3.  Patient will be able to improve 2-minute walk distance by 50 feet to show improve endurance and gait stability Baseline:  Goal status: MET, 495 feet   4.  Patient will be able to properly and safely perform hip hinge in order to care for her dogs Baseline:  Goal status: Ongoing needs reinforcement  5.  ABC score balance confidence to will improved by 20% baseline: 57% confident  Baseline: 57%, 70%  Goal status: MET   6.  Patient will be able to complete floor transfer mod independent to  reduce burden of care Baseline:  Goal status: not met, mod A  PLAN:  PT FREQUENCY: 2x/week  PT DURATION: 8 weeks  PLANNED INTERVENTIONS: [id number]- PT Re-evaluation, 97750- Physical Performance Testing, 97110-Therapeutic exercises, 97530- Therapeutic activity, [id number]- Neuromuscular re-education, 97535- Self Care, [id number]- Manual therapy, [phone]- Gait training, Patient/Family education, Balance training, Stair training, Cryotherapy, and Moist heat.  PLAN FOR NEXT SESSION: NA, DC    Neeraj Housand, PT 01/31/2024, 9:56 AM  Delon Norma, PT 01/31/24 9:56 AM Phone: [phone] Fax: [phone]   "

## 2024-02-01 ENCOUNTER — Telehealth: Payer: Self-pay | Admitting: Student in an Organized Health Care Education/Training Program

## 2024-02-01 DIAGNOSIS — R5381 Other malaise: Secondary | ICD-10-CM

## 2024-02-01 NOTE — Telephone Encounter (Signed)
 I placed the referral    Thank you

## 2024-02-01 NOTE — Telephone Encounter (Signed)
 Please see message below

## 2024-02-01 NOTE — Addendum Note (Signed)
 Addended by: JERRELL SOLIAN T on: 02/01/2024 03:57 PM   Modules accepted: Orders

## 2024-02-01 NOTE — Telephone Encounter (Signed)
 Copied from CRM #[id number]. Topic: Referral - Question >> Feb 01, 2024 10:29 AM Alfonso HERO wrote: Reason for CRM: patient calling to inform Dr Jerrell she has completed her physical therapy and requesting a referral for water therapy at [facility].

## 2024-02-13 ENCOUNTER — Ambulatory Visit: Admitting: Physician Assistant

## 2024-02-13 VITALS — Ht 67.0 in | Wt 162.0 lb

## 2024-02-13 DIAGNOSIS — M8000XA Age-related osteoporosis with current pathological fracture, unspecified site, initial encounter for fracture: Secondary | ICD-10-CM | POA: Diagnosis not present

## 2024-02-13 MED ORDER — ROMOSOZUMAB-AQQG 105 MG/1.17ML ~~LOC~~ SOSY
210.0000 mg | PREFILLED_SYRINGE | SUBCUTANEOUS | Status: AC
  Administered 2024-02-13: 210 mg via SUBCUTANEOUS

## 2024-02-13 NOTE — Addendum Note (Signed)
 Addended by: GLADIS JACOBSEN on: 02/13/2024 02:13 PM   Modules accepted: Orders

## 2024-02-20 ENCOUNTER — Ambulatory Visit: Payer: Medicare PPO

## 2024-03-05 ENCOUNTER — Other Ambulatory Visit: Payer: Medicare PPO

## 2024-03-05 ENCOUNTER — Ambulatory Visit: Payer: Medicare PPO | Admitting: Oncology

## 2024-03-05 ENCOUNTER — Ambulatory Visit: Payer: Medicare PPO

## 2024-03-18 ENCOUNTER — Ambulatory Visit (HOSPITAL_BASED_OUTPATIENT_CLINIC_OR_DEPARTMENT_OTHER): Payer: Self-pay | Admitting: Physical Therapy

## 2024-03-21 ENCOUNTER — Ambulatory Visit: Admitting: Student in an Organized Health Care Education/Training Program

## 2024-03-25 ENCOUNTER — Ambulatory Visit (HOSPITAL_BASED_OUTPATIENT_CLINIC_OR_DEPARTMENT_OTHER): Payer: Self-pay | Admitting: Physical Therapy

## 2024-04-01 ENCOUNTER — Ambulatory Visit (HOSPITAL_BASED_OUTPATIENT_CLINIC_OR_DEPARTMENT_OTHER): Payer: Self-pay | Admitting: Physical Therapy

## 2024-05-09 ENCOUNTER — Ambulatory Visit: Admitting: Dermatology

## 2024-05-21 ENCOUNTER — Ambulatory Visit

## 2024-08-20 ENCOUNTER — Ambulatory Visit

## 2024-11-19 ENCOUNTER — Ambulatory Visit

## 2025-02-18 ENCOUNTER — Ambulatory Visit

## 2025-05-20 ENCOUNTER — Ambulatory Visit

## 2025-08-19 ENCOUNTER — Ambulatory Visit

## 2025-11-18 ENCOUNTER — Ambulatory Visit

## 2026-02-17 ENCOUNTER — Ambulatory Visit
